# Patient Record
Sex: Female | Born: 1975 | Race: Black or African American | Hispanic: No | Marital: Married | Smoking: Former smoker
Health system: Southern US, Community
[De-identification: ages and names within clinical notes are randomized; demographics above are authoritative.]

## PROBLEM LIST (undated history)

## (undated) DIAGNOSIS — D649 Anemia, unspecified: Secondary | ICD-10-CM

## (undated) DIAGNOSIS — N189 Chronic kidney disease, unspecified: Secondary | ICD-10-CM

## (undated) DIAGNOSIS — F419 Anxiety disorder, unspecified: Secondary | ICD-10-CM

## (undated) DIAGNOSIS — F329 Major depressive disorder, single episode, unspecified: Secondary | ICD-10-CM

## (undated) DIAGNOSIS — R590 Localized enlarged lymph nodes: Secondary | ICD-10-CM

## (undated) DIAGNOSIS — R350 Frequency of micturition: Secondary | ICD-10-CM

## (undated) DIAGNOSIS — Z5189 Encounter for other specified aftercare: Secondary | ICD-10-CM

## (undated) DIAGNOSIS — R0600 Dyspnea, unspecified: Secondary | ICD-10-CM

## (undated) DIAGNOSIS — E559 Vitamin D deficiency, unspecified: Secondary | ICD-10-CM

## (undated) DIAGNOSIS — I10 Essential (primary) hypertension: Secondary | ICD-10-CM

## (undated) DIAGNOSIS — T7840XA Allergy, unspecified, initial encounter: Secondary | ICD-10-CM

## (undated) DIAGNOSIS — M5126 Other intervertebral disc displacement, lumbar region: Secondary | ICD-10-CM

## (undated) DIAGNOSIS — R569 Unspecified convulsions: Secondary | ICD-10-CM

## (undated) DIAGNOSIS — J45991 Cough variant asthma: Secondary | ICD-10-CM

## (undated) DIAGNOSIS — G43909 Migraine, unspecified, not intractable, without status migrainosus: Secondary | ICD-10-CM

## (undated) DIAGNOSIS — G4730 Sleep apnea, unspecified: Secondary | ICD-10-CM

## (undated) DIAGNOSIS — M7989 Other specified soft tissue disorders: Secondary | ICD-10-CM

## (undated) DIAGNOSIS — R609 Edema, unspecified: Secondary | ICD-10-CM

## (undated) DIAGNOSIS — M479 Spondylosis, unspecified: Secondary | ICD-10-CM

## (undated) DIAGNOSIS — R400 Somnolence: Secondary | ICD-10-CM

## (undated) DIAGNOSIS — M79606 Pain in leg, unspecified: Secondary | ICD-10-CM

## (undated) DIAGNOSIS — D869 Sarcoidosis, unspecified: Secondary | ICD-10-CM

## (undated) DIAGNOSIS — M1990 Unspecified osteoarthritis, unspecified site: Secondary | ICD-10-CM

## (undated) DIAGNOSIS — J45909 Unspecified asthma, uncomplicated: Secondary | ICD-10-CM

## (undated) DIAGNOSIS — K449 Diaphragmatic hernia without obstruction or gangrene: Secondary | ICD-10-CM

## (undated) DIAGNOSIS — D219 Benign neoplasm of connective and other soft tissue, unspecified: Secondary | ICD-10-CM

## (undated) DIAGNOSIS — F321 Major depressive disorder, single episode, moderate: Secondary | ICD-10-CM

## (undated) DIAGNOSIS — E611 Iron deficiency: Secondary | ICD-10-CM

## (undated) DIAGNOSIS — F909 Attention-deficit hyperactivity disorder, unspecified type: Secondary | ICD-10-CM

## (undated) DIAGNOSIS — G8929 Other chronic pain: Secondary | ICD-10-CM

## (undated) DIAGNOSIS — G43019 Migraine without aura, intractable, without status migrainosus: Secondary | ICD-10-CM

## (undated) DIAGNOSIS — R7301 Impaired fasting glucose: Secondary | ICD-10-CM

## (undated) DIAGNOSIS — E119 Type 2 diabetes mellitus without complications: Secondary | ICD-10-CM

## (undated) DIAGNOSIS — R0989 Other specified symptoms and signs involving the circulatory and respiratory systems: Secondary | ICD-10-CM

## (undated) DIAGNOSIS — K219 Gastro-esophageal reflux disease without esophagitis: Secondary | ICD-10-CM

## (undated) DIAGNOSIS — Z862 Personal history of diseases of the blood and blood-forming organs and certain disorders involving the immune mechanism: Secondary | ICD-10-CM

## (undated) DIAGNOSIS — R6882 Decreased libido: Secondary | ICD-10-CM

## (undated) DIAGNOSIS — E876 Hypokalemia: Secondary | ICD-10-CM

## (undated) DIAGNOSIS — F32A Depression, unspecified: Secondary | ICD-10-CM

## (undated) DIAGNOSIS — R0789 Other chest pain: Secondary | ICD-10-CM

## (undated) DIAGNOSIS — M545 Low back pain: Secondary | ICD-10-CM

## (undated) DIAGNOSIS — M19079 Primary osteoarthritis, unspecified ankle and foot: Secondary | ICD-10-CM

## (undated) HISTORY — DX: Anemia, unspecified: D64.9

## (undated) HISTORY — DX: Vitamin D deficiency, unspecified: E55.9

## (undated) HISTORY — DX: Migraine, unspecified, not intractable, without status migrainosus: G43.909

## (undated) HISTORY — DX: Encounter for other specified aftercare: Z51.89

## (undated) HISTORY — DX: Anxiety disorder, unspecified: F41.9

## (undated) HISTORY — DX: Essential (primary) hypertension: I10

## (undated) HISTORY — DX: Migraine without aura, intractable, without status migrainosus: G43.019

## (undated) HISTORY — DX: Edema, unspecified: R60.9

## (undated) HISTORY — DX: Other intervertebral disc displacement, lumbar region: M51.26

## (undated) HISTORY — DX: Diaphragmatic hernia without obstruction or gangrene: K44.9

## (undated) HISTORY — DX: Unspecified osteoarthritis, unspecified site: M19.90

## (undated) HISTORY — PX: TUBAL LIGATION: SHX77

## (undated) HISTORY — DX: Sarcoidosis, unspecified: D86.9

## (undated) HISTORY — DX: Other specified symptoms and signs involving the circulatory and respiratory systems: R09.89

## (undated) HISTORY — DX: Iron deficiency: E61.1

## (undated) HISTORY — PX: ABDOMINAL HYSTERECTOMY: SHX81

## (undated) HISTORY — DX: Localized enlarged lymph nodes: R59.0

## (undated) HISTORY — DX: Type 2 diabetes mellitus without complications: E11.9

## (undated) HISTORY — DX: Attention-deficit hyperactivity disorder, unspecified type: F90.9

## (undated) HISTORY — DX: Major depressive disorder, single episode, unspecified: F32.9

## (undated) HISTORY — DX: Unspecified asthma, uncomplicated: J45.909

## (undated) HISTORY — DX: Sleep apnea, unspecified: G47.30

## (undated) HISTORY — DX: Allergy, unspecified, initial encounter: T78.40XA

## (undated) HISTORY — DX: Chronic kidney disease, unspecified: N18.9

## (undated) HISTORY — DX: Spondylosis, unspecified: M47.9

## (undated) HISTORY — DX: Depression, unspecified: F32.A

---
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
## 2005-05-13 ENCOUNTER — Emergency Department (HOSPITAL_COMMUNITY): Admission: EM | Admit: 2005-05-13 | Discharge: 2005-05-13 | Payer: Self-pay | Admitting: Emergency Medicine

## 2005-06-14 ENCOUNTER — Emergency Department (HOSPITAL_COMMUNITY): Admission: EM | Admit: 2005-06-14 | Discharge: 2005-06-14 | Payer: Self-pay | Admitting: Emergency Medicine

## 2009-03-04 ENCOUNTER — Emergency Department (HOSPITAL_COMMUNITY): Admission: EM | Admit: 2009-03-04 | Discharge: 2009-03-04 | Payer: Self-pay | Admitting: Emergency Medicine

## 2009-04-18 ENCOUNTER — Emergency Department (HOSPITAL_COMMUNITY): Admission: EM | Admit: 2009-04-18 | Discharge: 2009-04-18 | Payer: Self-pay | Admitting: Emergency Medicine

## 2011-04-08 LAB — URINALYSIS, ROUTINE W REFLEX MICROSCOPIC
Bilirubin Urine: NEGATIVE
Glucose, UA: NEGATIVE mg/dL
Hgb urine dipstick: NEGATIVE
Ketones, ur: NEGATIVE mg/dL
Nitrite: POSITIVE — AB
Protein, ur: NEGATIVE mg/dL
Specific Gravity, Urine: 1.021 (ref 1.005–1.030)
Urobilinogen, UA: 0.2 mg/dL (ref 0.0–1.0)
pH: 6 (ref 5.0–8.0)

## 2011-04-08 LAB — URINE MICROSCOPIC-ADD ON

## 2011-04-08 LAB — POCT PREGNANCY, URINE: Preg Test, Ur: NEGATIVE

## 2011-11-23 ENCOUNTER — Emergency Department (HOSPITAL_COMMUNITY): Admission: EM | Admit: 2011-11-23 | Discharge: 2011-11-23 | Discharge status: Against Medical Advice | Payer: Self-pay

## 2012-07-10 ENCOUNTER — Emergency Department (HOSPITAL_COMMUNITY): Payer: Self-pay

## 2012-07-10 ENCOUNTER — Emergency Department (HOSPITAL_COMMUNITY)
Admission: EM | Admit: 2012-07-10 | Discharge: 2012-07-10 | Disposition: A | Discharge status: Home / Self Care | Payer: Self-pay | Attending: Emergency Medicine | Admitting: Emergency Medicine

## 2012-07-10 ENCOUNTER — Encounter (HOSPITAL_COMMUNITY): Payer: Self-pay | Admitting: Family Medicine

## 2012-07-10 DIAGNOSIS — W010XXA Fall on same level from slipping, tripping and stumbling without subsequent striking against object, initial encounter: Secondary | ICD-10-CM | POA: Insufficient documentation

## 2012-07-10 DIAGNOSIS — M549 Dorsalgia, unspecified: Secondary | ICD-10-CM | POA: Insufficient documentation

## 2012-07-10 DIAGNOSIS — T1490XA Injury, unspecified, initial encounter: Secondary | ICD-10-CM | POA: Insufficient documentation

## 2012-07-10 DIAGNOSIS — N390 Urinary tract infection, site not specified: Secondary | ICD-10-CM | POA: Insufficient documentation

## 2012-07-10 DIAGNOSIS — W19XXXA Unspecified fall, initial encounter: Secondary | ICD-10-CM

## 2012-07-10 HISTORY — DX: Unspecified convulsions: R56.9

## 2012-07-10 LAB — URINE MICROSCOPIC-ADD ON

## 2012-07-10 LAB — URINALYSIS, ROUTINE W REFLEX MICROSCOPIC
Bilirubin Urine: NEGATIVE
Glucose, UA: NEGATIVE mg/dL
Hgb urine dipstick: NEGATIVE
Nitrite: POSITIVE — AB
Protein, ur: NEGATIVE mg/dL
Specific Gravity, Urine: 1.028 (ref 1.005–1.030)
Urobilinogen, UA: 0.2 mg/dL (ref 0.0–1.0)
pH: 5.5 (ref 5.0–8.0)

## 2012-07-10 MED ORDER — OXYCODONE-ACETAMINOPHEN 5-325 MG PO TABS
2.0000 | ORAL_TABLET | Freq: Once | ORAL | Status: AC
  Administered 2012-07-10: 2 via ORAL
  Filled 2012-07-10: qty 2

## 2012-07-10 MED ORDER — NITROFURANTOIN MONOHYD MACRO 100 MG PO CAPS: 100.0000 mg | ORAL_CAPSULE | Freq: Two times a day (BID) | ORAL | Status: AC

## 2012-07-10 MED ORDER — HYDROCODONE-ACETAMINOPHEN 5-325 MG PO TABS: 2.0000 | ORAL_TABLET | ORAL | Status: AC | PRN

## 2012-07-10 NOTE — ED Provider Notes (Signed)
History     CSN: [id number]  Arrival date & time 07/10/12  1436   First MD Initiated Contact with Patient 07/10/12 1547      Chief Complaint  Patient presents with  . Fall    (Consider location/radiation/quality/duration/timing/severity/associated sxs/prior treatment) HPI Comments: Patient reports that approximately 5 hours prior to arrival she slipped and fell while getting out of the shower.  She landed on the right side of her body.  She did not hit her head.  No LOC.  She was ambulatory after the fall.  She is currently having pain of her right arm, right leg, upper back, and the right side of her neck.  She tried taking Ibuprofen, but does not feel that it helped with the pain.  She denies any nausea, vomiting, vision changes, dizziness, or lightheadedness.     Patient is a 36 y.o. female presenting with dysuria. The history is provided by the patient.  Dysuria  This is a new problem. Episode onset: one week ago. The problem occurs every urination. The problem has been gradually worsening. The quality of the pain is described as burning. There has been no fever. Associated symptoms include frequency. Pertinent negatives include no chills, no nausea, no vomiting, no discharge, no hematuria, no urgency and no flank pain. She has tried nothing for the symptoms. Her past medical history does not include kidney stones or recurrent UTIs.    Past Medical History  Diagnosis Date  . Seizures     Past Surgical History  Procedure Date  . Tubal ligation     History reviewed. No pertinent family history.  History  Substance Use Topics  . Smoking status: Current Everyday Smoker -- 0.5 packs/day  . Smokeless tobacco: Not on file  . Alcohol Use: No    OB History    Grav Para Term Preterm Abortions TAB SAB Ect Mult Living                  Review of Systems  Constitutional: Negative for fever and chills.  HENT: Negative for neck pain and neck stiffness.   Eyes: Negative for  visual disturbance.  Respiratory: Negative for shortness of breath.   Cardiovascular: Negative for chest pain.  Gastrointestinal: Negative for nausea, vomiting and abdominal pain.  Genitourinary: Positive for dysuria and frequency. Negative for urgency, hematuria and flank pain.  Musculoskeletal: Positive for back pain. Negative for gait problem.  Skin: Negative for wound.  Neurological: Negative for dizziness, syncope, light-headedness and numbness.    Allergies  Review of patient's allergies indicates no known allergies.  Home Medications   Current Outpatient Rx  Name Route Sig Dispense Refill  . IBUPROFEN 200 MG PO TABS Oral Take 600 mg by mouth every 6 (six) hours as needed. Pain.      BP 117/86  Pulse 56  Temp 99.1 F (37.3 C) (Oral)  Resp 18  SpO2 100%  LMP 07/03/2012  Physical Exam  Nursing note and vitals reviewed. Constitutional: She appears well-developed and well-nourished. No distress.  HENT:  Head: Normocephalic and atraumatic.  Mouth/Throat: Oropharynx is clear and moist.  Eyes: EOM are normal. Pupils are equal, round, and reactive to light.  Neck: Normal range of motion. Neck supple.  Cardiovascular: Normal rate, regular rhythm and normal heart sounds.   Pulmonary/Chest: Effort normal and breath sounds normal.  Musculoskeletal: Normal range of motion. She exhibits no edema and no tenderness.  Neurological: She is alert. She has normal strength. No cranial nerve deficit or sensory  deficit. Gait normal.  Skin: Skin is warm, dry and intact. No abrasion, no bruising and no ecchymosis noted. She is not diaphoretic. No erythema.  Psychiatric: She has a normal mood and affect.    ED Course  Procedures (including critical care time)  Labs Reviewed - No data to display Dg Thoracic Spine 2 View  07/10/2012  *RADIOLOGY REPORT*  Clinical Data: Fall, central back pain  THORACIC SPINE - 2 VIEW  Comparison: None.  Findings: Normal alignment.  No compression fracture,  wedge shaped deformity or focal kyphosis.  Very minor anterior endplate degenerative changes.  Preserved vertebral body heights and disc spaces.  Normal paraspinous soft tissues.  Pedicles appear intact.  IMPRESSION: No acute finding by plain radiography  Original Report Authenticated By: Judie Petit. Ruel Favors, M.D.     1. Back pain   2. Fall   3. UTI (urinary tract infection)       MDM  Patient presenting after a fall in the shower.  Full ROM of all extremities.  No LOC.  Thoracic spine tender to palpation.  Therefore, xrays ordered.  Negative xrays.  Patient also presenting with dysuria.  UA positive for UTI.  Patient febrile.  No nausea or vomiting.  No flank pain.  Patient discharged home with antibiotic.          Pascal Lux Rossmoyne, PA-C 07/11/12 [phone]

## 2012-07-10 NOTE — ED Notes (Signed)
Pt reports slipping getting out of the shower this morning. Denies loc. Reports soreness all over. NAD noted at this time. Denies dizziness, vision problems, N/V.

## 2012-07-11 NOTE — ED Provider Notes (Signed)
Medical screening examination/treatment/procedure(s) were performed by non-physician practitioner and as supervising physician I was immediately available for consultation/collaboration.  Flint Melter, MD 07/11/12 [phone]

## 2012-08-07 ENCOUNTER — Emergency Department (HOSPITAL_COMMUNITY): Payer: Self-pay

## 2012-08-07 ENCOUNTER — Encounter (HOSPITAL_COMMUNITY): Payer: Self-pay | Admitting: Emergency Medicine

## 2012-08-07 ENCOUNTER — Emergency Department (HOSPITAL_COMMUNITY)
Admission: EM | Admit: 2012-08-07 | Discharge: 2012-08-07 | Disposition: A | Discharge status: Home / Self Care | Payer: Self-pay | Attending: Emergency Medicine | Admitting: Emergency Medicine

## 2012-08-07 DIAGNOSIS — S8000XA Contusion of unspecified knee, initial encounter: Secondary | ICD-10-CM

## 2012-08-07 DIAGNOSIS — M25569 Pain in unspecified knee: Secondary | ICD-10-CM | POA: Insufficient documentation

## 2012-08-07 DIAGNOSIS — F17200 Nicotine dependence, unspecified, uncomplicated: Secondary | ICD-10-CM | POA: Insufficient documentation

## 2012-08-07 DIAGNOSIS — G40909 Epilepsy, unspecified, not intractable, without status epilepticus: Secondary | ICD-10-CM | POA: Insufficient documentation

## 2012-08-07 MED ORDER — IBUPROFEN 600 MG PO TABS: 600.0000 mg | ORAL_TABLET | Freq: Three times a day (TID) | ORAL | Status: AC | PRN

## 2012-08-07 MED ORDER — HYDROCODONE-ACETAMINOPHEN 5-325 MG PO TABS
2.0000 | ORAL_TABLET | Freq: Once | ORAL | Status: AC
  Administered 2012-08-07: 2 via ORAL
  Filled 2012-08-07: qty 2

## 2012-08-07 NOTE — ED Provider Notes (Signed)
History  This chart was scribed for Suzi Roots, MD by Bennett Scrape. This patient was seen in room TR08C/TR08C and the patient's care was started at 1:24PM.  CSN: [id number]  Arrival date & time 08/07/12  1202   First MD Initiated Contact with Patient 08/07/12 1324      Chief Complaint  Patient presents with  . Knee Pain    Patient is a 36 y.o. female presenting with knee pain. The history is provided by the patient. No language interpreter was used.  Knee Pain    Victoria Moore is a 36 y.o. female who presents to the Emergency Department complaining of 1 to 2 hours of sudden onset, non-changing, constant left knee pain described as a shooting pain that radiates up to the left hip that started after she smacked it against her dresser at home. The pain is worse with standing and walking. She denies taking OTC medications at home to improve symptoms. She denies prior problems with the left knee. She denies any other injuries currently. She denies fever, chills, nausea and emesis as associated symptoms. She has a h/o seizures. She is a current everyday smoker and occasional alcohol user.denies twisting injury. Is ambulatory. No hip or ankle pain.   Past Medical History  Diagnosis Date  . Seizures     Past Surgical History  Procedure Date  . Tubal ligation     History reviewed. No pertinent family history.  History  Substance Use Topics  . Smoking status: Current Everyday Smoker -- 0.5 packs/day  . Smokeless tobacco: Not on file  . Alcohol Use: Yes     occasional    No OB history provided.  Review of Systems  Constitutional: Negative for fever and chills.  Cardiovascular: Negative for leg swelling.  Musculoskeletal: Negative for joint swelling.  Skin: Negative for wound.  Neurological: Negative for weakness and numbness.    Allergies  Review of patient's allergies indicates no known allergies.  Home Medications   Current Outpatient Rx  Name Route Sig  Dispense Refill  . IBUPROFEN 200 MG PO TABS Oral Take 600 mg by mouth every 6 (six) hours as needed. For pain.    Marland Kitchen PHENYTOIN SODIUM EXTENDED 100 MG PO CAPS Oral Take 300 mg by mouth daily as needed. For seizures.      Triage Vitals: BP 135/90  Pulse 61  Temp 98.2 F (36.8 C) (Oral)  Resp 16  SpO2 99%  LMP 07/31/2012  Physical Exam  Nursing note and vitals reviewed. Constitutional: She appears well-developed and well-nourished. No distress.  Eyes: Conjunctivae are normal. No scleral icterus.  Neck: Neck supple. No tracheal deviation present.  Cardiovascular: Normal rate.   Pulmonary/Chest: Effort normal. No respiratory distress.  Abdominal: Normal appearance. She exhibits no distension.  Musculoskeletal: She exhibits no edema.       Tenderness left knee anteriorly. No effusion. Knee stable, no gross ligament laxity. Good rom at hip knee and ankle without pain.  Distal pulses palp.   Neurological: She is alert.  Skin: Skin is warm and dry. No rash noted.  Psychiatric: She has a normal mood and affect.    ED Course  Procedures (including critical care time)  DIAGNOSTIC STUDIES: Oxygen Saturation is 99% on room air, normal by my interpretation.    COORDINATION OF CARE: 1:   Labs Reviewed - No data to display Dg Knee Complete 4 Views Left  08/07/2012  *RADIOLOGY REPORT*  Clinical Data: Recent trauma with pain  LEFT KNEE - COMPLETE  4+ VIEW  Comparison: None.  Findings: No evidence of effusion, fracture, dislocation, degenerative change or other focal lesion.  IMPRESSION: Negative radiographs  Original Report Authenticated By: Thomasenia Sales, M.D.       MDM  I personally performed the services described in this documentation, which was scribed in my presence. The recorded information has been reviewed and considered. Suzi Roots, MD   vicodin po in ed (pt states has ride, does not have to drive).  xrays neg.   Motrin po.       Suzi Roots, MD 08/07/12  [phone]

## 2012-08-07 NOTE — ED Notes (Signed)
Pt c/o left knee pain after hitting knee on dresser today; pt sts painful to walk on

## 2012-10-27 ENCOUNTER — Emergency Department (HOSPITAL_COMMUNITY)
Admission: EM | Admit: 2012-10-27 | Discharge: 2012-10-27 | Disposition: A | Discharge status: Home / Self Care | Payer: Self-pay | Attending: Emergency Medicine | Admitting: Emergency Medicine

## 2012-10-27 ENCOUNTER — Emergency Department (HOSPITAL_COMMUNITY): Payer: Self-pay

## 2012-10-27 ENCOUNTER — Encounter (HOSPITAL_COMMUNITY): Payer: Self-pay | Admitting: Family Medicine

## 2012-10-27 DIAGNOSIS — Y9289 Other specified places as the place of occurrence of the external cause: Secondary | ICD-10-CM | POA: Insufficient documentation

## 2012-10-27 DIAGNOSIS — S0993XA Unspecified injury of face, initial encounter: Secondary | ICD-10-CM | POA: Insufficient documentation

## 2012-10-27 DIAGNOSIS — S199XXA Unspecified injury of neck, initial encounter: Secondary | ICD-10-CM

## 2012-10-27 DIAGNOSIS — F17200 Nicotine dependence, unspecified, uncomplicated: Secondary | ICD-10-CM | POA: Insufficient documentation

## 2012-10-27 DIAGNOSIS — W1789XA Other fall from one level to another, initial encounter: Secondary | ICD-10-CM | POA: Insufficient documentation

## 2012-10-27 DIAGNOSIS — G40802 Other epilepsy, not intractable, without status epilepticus: Secondary | ICD-10-CM | POA: Insufficient documentation

## 2012-10-27 DIAGNOSIS — Y93I9 Activity, other involving external motion: Secondary | ICD-10-CM | POA: Insufficient documentation

## 2012-10-27 MED ORDER — ALBUTEROL SULFATE (5 MG/ML) 0.5% IN NEBU: 5.0000 mg | INHALATION_SOLUTION | Freq: Once | RESPIRATORY_TRACT | Status: DC

## 2012-10-27 MED ORDER — PREDNISONE 20 MG PO TABS: 60.0000 mg | ORAL_TABLET | Freq: Once | ORAL | Status: DC

## 2012-10-27 MED ORDER — IPRATROPIUM BROMIDE 0.02 % IN SOLN: 0.5000 mg | Freq: Once | RESPIRATORY_TRACT | Status: DC

## 2012-10-27 MED ORDER — OXYCODONE-ACETAMINOPHEN 5-325 MG PO TABS: 1.0000 | ORAL_TABLET | ORAL | Status: DC | PRN

## 2012-10-27 MED ORDER — IBUPROFEN 400 MG PO TABS
400.0000 mg | ORAL_TABLET | Freq: Once | ORAL | Status: AC
  Administered 2012-10-27: 400 mg via ORAL
  Filled 2012-10-27: qty 1
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
## 2012-11-15 ENCOUNTER — Emergency Department (HOSPITAL_COMMUNITY): Payer: Self-pay

## 2012-11-15 ENCOUNTER — Emergency Department (HOSPITAL_COMMUNITY)
Admission: EM | Admit: 2012-11-15 | Discharge: 2012-11-15 | Disposition: A | Discharge status: Home / Self Care | Payer: Self-pay | Attending: Emergency Medicine | Admitting: Emergency Medicine

## 2012-11-15 ENCOUNTER — Encounter (HOSPITAL_COMMUNITY): Payer: Self-pay | Admitting: Emergency Medicine

## 2012-11-15 DIAGNOSIS — S93409A Sprain of unspecified ligament of unspecified ankle, initial encounter: Secondary | ICD-10-CM | POA: Insufficient documentation

## 2012-11-15 DIAGNOSIS — W108XXA Fall (on) (from) other stairs and steps, initial encounter: Secondary | ICD-10-CM | POA: Insufficient documentation

## 2012-11-15 DIAGNOSIS — Y929 Unspecified place or not applicable: Secondary | ICD-10-CM | POA: Insufficient documentation

## 2012-11-15 DIAGNOSIS — Z8669 Personal history of other diseases of the nervous system and sense organs: Secondary | ICD-10-CM | POA: Insufficient documentation

## 2012-11-15 DIAGNOSIS — Y9301 Activity, walking, marching and hiking: Secondary | ICD-10-CM | POA: Insufficient documentation

## 2012-11-15 DIAGNOSIS — F17200 Nicotine dependence, unspecified, uncomplicated: Secondary | ICD-10-CM | POA: Insufficient documentation

## 2012-11-15 MED ORDER — OXYCODONE-ACETAMINOPHEN 5-325 MG PO TABS: 1.0000 | ORAL_TABLET | ORAL | Status: DC | PRN

## 2012-11-15 MED ORDER — MORPHINE SULFATE 4 MG/ML IJ SOLN
4.0000 mg | Freq: Once | INTRAMUSCULAR | Status: AC
  Administered 2012-11-15: 4 mg via INTRAMUSCULAR
  Filled 2012-11-15: qty 1

## 2012-11-15 NOTE — ED Notes (Signed)
Patient transported to X-ray 

## 2012-11-15 NOTE — ED Provider Notes (Signed)
History    This chart was scribed for Cheri Guppy, MD, MD by Smitty Pluck, ED Scribe. The patient was seen in room TR10C and the patient's care was started at 12:42PM.   CSN: [id number]  Arrival date & time 11/15/12  1138      Chief Complaint  Patient presents with  . Ankle Pain    (Consider location/radiation/quality/duration/timing/severity/associated sxs/prior treatment) Patient is a 36 y.o. female presenting with ankle pain. The history is provided by the patient. No language interpreter was used.  Ankle Pain    Victoria Moore is a 36 y.o. female who presents to the Emergency Department complaining of constant, moderate left ankle pain onset today. Pt reports that she was walking down steps and fell down steps landing on her left ankle. Pt denies LOC, head injury, back pain, leg pain and any other pain. Pt has not taken any medication PTA.  Pt is allergic to Ibuprofen   Past Medical History  Diagnosis Date  . Seizures     Past Surgical History  Procedure Date  . Tubal ligation     History reviewed. No pertinent family history.  History  Substance Use Topics  . Smoking status: Current Every Day Smoker -- 0.5 packs/day  . Smokeless tobacco: Not on file  . Alcohol Use: Yes     Comment: occasional    OB History    Grav Para Term Preterm Abortions TAB SAB Ect Mult Living                  Review of Systems  Constitutional: Negative for fever and chills.  Respiratory: Negative for shortness of breath.   Gastrointestinal: Negative for nausea and vomiting.  Neurological: Negative for weakness.  All other systems reviewed and are negative.    Allergies  Aspirin  Home Medications   Current Outpatient Rx  Name  Route  Sig  Dispense  Refill  . OXYCODONE-ACETAMINOPHEN 5-325 MG PO TABS   Oral   Take 1 tablet by mouth every 4 (four) hours as needed for pain.   15 tablet   0     Dispense as written.     BP 137/109  Pulse 71  Temp 98.3 F (36.8  C) (Oral)  Resp 18  SpO2 100%  LMP 10/20/2012  Physical Exam  Nursing note and vitals reviewed. Constitutional: She is oriented to person, place, and time. She appears well-developed and well-nourished. No distress.  HENT:  Head: Normocephalic and atraumatic.  Eyes: EOM are normal.  Neck: Neck supple. No tracheal deviation present.  Cardiovascular: Normal rate.   Pulmonary/Chest: Effort normal. No respiratory distress.  Musculoskeletal: Normal range of motion.       Anterior lower tibia is tender to palpation  No deformity  No tenderness of medial malleolus  Tenderness of achilles tendon Lateral malleolus and distal fibular tenderness  No tenderness of base of 5th metatarsal   Neurological: She is alert and oriented to person, place, and time.  Skin: Skin is warm and dry.  Psychiatric: She has a normal mood and affect. Her behavior is normal.    ED Course  Procedures (including critical care time) DIAGNOSTIC STUDIES: Oxygen Saturation is 100% on room air, normal by my interpretation.    COORDINATION OF CARE: 12:47 PM Discussed ED treatment with pt  12:47 PM Ordered:    .  morphine injection  4 mg Intramuscular Once       Labs Reviewed - No data to display Dg Ankle Complete Left  11/15/2012  *RADIOLOGY REPORT*  Clinical Data: Larey Seat and twisted left ankle.  Ankle pain and swelling.  LEFT ANKLE COMPLETE - 3+ VIEW  Comparison: None.  Findings: There is no evidence for fracture, subluxation or dislocation.  No worrisome lytic or sclerotic osseous lesion.  IMPRESSION: Normal exam.   Original Report Authenticated By: Kennith Center, M.D.      No diagnosis found.    MDM  Ankle sprain No fx or dislocation      I personally performed the services described in this documentation, which was scribed in my presence. The recorded information has been reviewed and is accurate.     Cheri Guppy, MD 11/15/12 1355

## 2012-11-15 NOTE — ED Notes (Signed)
Pt c/o left ankle pain after twisting ankle when going down stairs

## 2012-11-15 NOTE — ED Notes (Signed)
Splint applied and crutches given to patient.

## 2013-03-14 ENCOUNTER — Emergency Department (HOSPITAL_COMMUNITY): Payer: Self-pay

## 2013-03-14 ENCOUNTER — Emergency Department (HOSPITAL_COMMUNITY)
Admission: EM | Admit: 2013-03-14 | Discharge: 2013-03-14 | Disposition: A | Discharge status: Home / Self Care | Payer: Self-pay | Attending: Emergency Medicine | Admitting: Emergency Medicine

## 2013-03-14 ENCOUNTER — Encounter (HOSPITAL_COMMUNITY): Payer: Self-pay | Admitting: *Deleted

## 2013-03-14 DIAGNOSIS — R3911 Hesitancy of micturition: Secondary | ICD-10-CM | POA: Insufficient documentation

## 2013-03-14 DIAGNOSIS — G5600 Carpal tunnel syndrome, unspecified upper limb: Secondary | ICD-10-CM | POA: Insufficient documentation

## 2013-03-14 DIAGNOSIS — Z3202 Encounter for pregnancy test, result negative: Secondary | ICD-10-CM | POA: Insufficient documentation

## 2013-03-14 DIAGNOSIS — G40909 Epilepsy, unspecified, not intractable, without status epilepticus: Secondary | ICD-10-CM | POA: Insufficient documentation

## 2013-03-14 DIAGNOSIS — Z79899 Other long term (current) drug therapy: Secondary | ICD-10-CM | POA: Insufficient documentation

## 2013-03-14 DIAGNOSIS — G5601 Carpal tunnel syndrome, right upper limb: Secondary | ICD-10-CM

## 2013-03-14 DIAGNOSIS — M79609 Pain in unspecified limb: Secondary | ICD-10-CM | POA: Insufficient documentation

## 2013-03-14 DIAGNOSIS — F17200 Nicotine dependence, unspecified, uncomplicated: Secondary | ICD-10-CM | POA: Insufficient documentation

## 2013-03-14 LAB — PREGNANCY, URINE: Preg Test, Ur: NEGATIVE

## 2013-03-14 LAB — URINALYSIS, ROUTINE W REFLEX MICROSCOPIC
Glucose, UA: NEGATIVE mg/dL
Hgb urine dipstick: NEGATIVE
Ketones, ur: NEGATIVE mg/dL
Nitrite: NEGATIVE
Protein, ur: NEGATIVE mg/dL
Specific Gravity, Urine: 1.025 (ref 1.005–1.030)
Urobilinogen, UA: 0.2 mg/dL (ref 0.0–1.0)
pH: 5 (ref 5.0–8.0)

## 2013-03-14 LAB — URINE MICROSCOPIC-ADD ON

## 2013-03-14 MED ORDER — NAPROXEN 375 MG PO TABS
375.0000 mg | ORAL_TABLET | Freq: Once | ORAL | Status: AC
  Administered 2013-03-14: 375 mg via ORAL
  Filled 2013-03-14: qty 1

## 2013-03-14 MED ORDER — NAPROXEN 375 MG PO TABS: 375.0000 mg | ORAL_TABLET | Freq: Two times a day (BID) | ORAL | Status: DC

## 2013-03-14 MED ORDER — PHENAZOPYRIDINE HCL 200 MG PO TABS: 200.0000 mg | ORAL_TABLET | Freq: Three times a day (TID) | ORAL | Status: DC

## 2013-03-14 NOTE — ED Provider Notes (Signed)
Medical screening examination/treatment/procedure(s) were performed by non-physician practitioner and as supervising physician I was immediately available for consultation/collaboration.    Kleber Crean R Sheril Hammond, MD 03/14/13 2349 

## 2013-03-14 NOTE — ED Provider Notes (Signed)
History     CSN: [id number]  Arrival date & time 03/14/13  1522   First MD Initiated Contact with Patient 03/14/13 1544      Chief Complaint  Patient presents with  . Urinary Tract Infection  . Arm Pain    (Consider location/radiation/quality/duration/timing/severity/associated sxs/prior treatment) HPI  PT to the ER with complaints of dysuria and urinary hesitancy. She is also having right wrist pain. Pt has been seen in the past year for multiple pain complaints.  Urinary symptoms- The symptoms have been persisting for about 3 weeks. She also admits that she was having similar symptoms previously which were caused by a UTI. She denies having flank pain, fevers, chills, N/V/D,.  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 The pain in her wrist is worse with extension and flexion. She describes the pain as shooting out into her fingers and up her shoulder. Denies any specific injury or start date.    Past Medical History  Diagnosis Date  . Seizures     Past Surgical History  Procedure Laterality Date  . Tubal ligation      No family history on file.  History  Substance Use Topics  . Smoking status: Current Every Day Smoker -- 0.50 packs/day  . Smokeless tobacco: Not on file  . Alcohol Use: Yes     Comment: occasional    OB History   Grav Para Term Preterm Abortions TAB SAB Ect Mult Living                  Review of Systems  All other systems reviewed and are negative.   Allergies  Aspirin  Home Medications   Current Outpatient Rx  Name  Route  Sig  Dispense  Refill  . ibuprofen (ADVIL,MOTRIN) 200 MG tablet   Oral   Take 800 mg by mouth every 6 (six) hours as needed for pain.         . naproxen (NAPROSYN) 375 MG tablet   Oral   Take 1 tablet (375 mg total) by mouth 2 (two) times daily.   20 tablet   0   . phenazopyridine (PYRIDIUM) 200 MG tablet   Oral   Take 1 tablet (200 mg total) by mouth 3 (three) times daily.   6 tablet   0      BP 142/94  Pulse 76  Temp(Src) 99.5 F (37.5 C) (Oral)  Resp 20  SpO2 100%  LMP 02/22/2013  Physical Exam  Nursing note and vitals reviewed. Constitutional: She appears well-developed and well-nourished. No distress.  HENT:  Head: Normocephalic and atraumatic.  Eyes: Pupils are equal, round, and reactive to light.  Neck: Normal range of motion. Neck supple.  Cardiovascular: Normal rate and regular rhythm.   Pulmonary/Chest: Effort normal.  Abdominal: Soft. There is no tenderness.  Musculoskeletal:       Right wrist: She exhibits decreased range of motion (due to pain) and tenderness. She exhibits no bony tenderness, no swelling, no effusion, no crepitus, no deformity and no laceration.  Neurological: She is alert.  Skin: Skin is warm and dry.    ED Course  Procedures (including critical care time)  Labs Reviewed  URINALYSIS, ROUTINE W REFLEX MICROSCOPIC - Abnormal; Notable for the following:    APPearance CLOUDY (*)    Bilirubin Urine SMALL (*)    Leukocytes, UA SMALL (*)    All other components within normal limits  URINE MICROSCOPIC-ADD ON  PREGNANCY, URINE   Dg Wrist Complete  Right  03/14/2013  *RADIOLOGY REPORT*  Clinical Data: Right wrist pain.  RIGHT WRIST - COMPLETE 3+ VIEW  Comparison: None  Findings: No evidence of acute fracture, subluxation or dislocation identified.  No radio-opaque foreign bodies are present.  No focal bony lesions are noted.  The joint spaces are unremarkable.  IMPRESSION: Unremarkable right wrist.   Original Report Authenticated By: Harmon Pier, M.D.      1. 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 Referral to hand and naprosyn Rx given.  Urinalysis not significant for UTI. Culture sent out. Rx Pyridium and referral to Urology.  Pt has been advised of the symptoms that warrant their return to the ED. Patient has voiced understanding and has agreed to follow-up with the PCP  or specialist.         Dorthula Matas, PA-C 03/14/13 1801

## 2013-03-14 NOTE — ED Notes (Signed)
Lab notified of urine preg add-on order

## 2013-03-14 NOTE — ED Notes (Signed)
Pt states for the past 2 days has had R arm tingling/numbness, states it comes and goes, states pain starts at wrist and shoots up R arm, denies injury. Pt also complaining of UTI symptoms, burning/pain w/ urination, states "I only use the bathroom twice a day, morning and night and sometimes I just dribble".

## 2013-03-17 ENCOUNTER — Emergency Department (HOSPITAL_COMMUNITY)
Admission: EM | Admit: 2013-03-17 | Discharge: 2013-03-17 | Disposition: A | Discharge status: Home / Self Care | Payer: Self-pay | Attending: Emergency Medicine | Admitting: Emergency Medicine

## 2013-03-17 ENCOUNTER — Encounter (HOSPITAL_COMMUNITY): Payer: Self-pay | Admitting: Nurse Practitioner

## 2013-03-17 DIAGNOSIS — F17200 Nicotine dependence, unspecified, uncomplicated: Secondary | ICD-10-CM | POA: Insufficient documentation

## 2013-03-17 DIAGNOSIS — Z79899 Other long term (current) drug therapy: Secondary | ICD-10-CM | POA: Insufficient documentation

## 2013-03-17 DIAGNOSIS — M25531 Pain in right wrist: Secondary | ICD-10-CM

## 2013-03-17 DIAGNOSIS — Z8669 Personal history of other diseases of the nervous system and sense organs: Secondary | ICD-10-CM | POA: Insufficient documentation

## 2013-03-17 DIAGNOSIS — M25539 Pain in unspecified wrist: Secondary | ICD-10-CM | POA: Insufficient documentation

## 2013-03-17 DIAGNOSIS — R209 Unspecified disturbances of skin sensation: Secondary | ICD-10-CM | POA: Insufficient documentation

## 2013-03-17 MED ORDER — DEXAMETHASONE SODIUM PHOSPHATE 10 MG/ML IJ SOLN
10.0000 mg | Freq: Once | INTRAMUSCULAR | Status: AC
  Administered 2013-03-17: 10 mg via INTRAMUSCULAR
  Filled 2013-03-17: qty 1

## 2013-03-17 MED ORDER — MELOXICAM 15 MG PO TABS: 15.0000 mg | ORAL_TABLET | Freq: Every day | ORAL | Status: DC

## 2013-03-17 MED ORDER — HYDROCODONE-ACETAMINOPHEN 5-325 MG PO TABS: 1.0000 | ORAL_TABLET | Freq: Four times a day (QID) | ORAL | Status: DC | PRN

## 2013-03-17 NOTE — ED Provider Notes (Signed)
History  This chart was scribed for non-physician practitioner Arthor Captain, PA-C working with Richardean Canal, MD, by Candelaria Stagers, ED Scribe. This patient was seen in room TR08C/TR08C and the patient's care was started at 4:10 PM   CSN: [id number]  Arrival date & time 03/17/13  1546   First MD Initiated Contact with Patient 03/17/13 1603      Chief Complaint  Patient presents with  . Wrist Pain     The history is provided by the patient. No language interpreter was used.   Victoria Moore is a 37 y.o. female who presents to the Emergency Department complaining of right wrist pain that started several weeks ago.  She is also experiencing numbness to the fingers.  She denies injury or trauma.  Pt has radiating pain to the left neck.  Pt was diagnosed with carpel tunnel four days ago and was prescribed naproxen which she states has provided no relief.  Pt is wearing an ankle brace.  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  Pt is right hand dominant.  She has not seen an orthopaedist.      Past Medical History  Diagnosis Date  . Seizures     Past Surgical History  Procedure Laterality Date  . Tubal ligation      No family history on file.  History  Substance Use Topics  . Smoking status: Current Every Day Smoker -- 0.50 packs/day  . Smokeless tobacco: Not on file  . Alcohol Use: No     Comment: occasional    OB History   Grav Para Term Preterm Abortions TAB SAB Ect Mult Living                  Review of Systems  Musculoskeletal: Positive for arthralgias (right wrist pain).  All other systems reviewed and are negative.    Allergies  Aspirin  Home Medications   Current Outpatient Rx  Name  Route  Sig  Dispense  Refill  . naproxen (NAPROSYN) 375 MG tablet   Oral   Take 375 mg by mouth 2 (two) times daily.         . phenazopyridine (PYRIDIUM) 200 MG tablet   Oral   Take 200 mg by mouth 3 (three) times daily.           BP 126/81   Pulse 62  Temp(Src) 98.7 F (37.1 C) (Oral)  Resp 16  SpO2 100%  LMP 02/22/2013  Physical Exam  Nursing note and vitals reviewed. Constitutional: She is oriented to person, place, and time. She appears well-developed and well-nourished. No distress.  HENT:  Head: Normocephalic and atraumatic.  Eyes: EOM are normal.  Neck: Neck supple. No tracheal deviation present.  Cardiovascular: Normal rate.   Pulmonary/Chest: Effort normal. No respiratory distress.  Musculoskeletal: Normal range of motion. She exhibits tenderness.  Tenderness with flexion and extension of right wrist.  No crepitus, popping with movement.  Numbness and tingling throughout the whole right hand.    Neurological: She is alert and oriented to person, place, and time.  Skin: Skin is warm and dry.  Psychiatric: She has a normal mood and affect. Her behavior is normal.    ED Course  Procedures   DIAGNOSTIC STUDIES: Oxygen Saturation is 100% on room air, normal by my interpretation.    COORDINATION OF CARE:  4:12 PM Discussed course of care with pt which includes rest and ice.  Will give Decadron injection in ED  and prescribe Mobic and narcotic pain reliever for night time.  Advised pt to follow up with orthopaedist.  Pt understands and agrees.    Labs Reviewed - No data to display No results found.   1. Wrist pain, right       MDM  Patient with wrist pain.  Possibly from carpal tunnel.  May also be secondary to radiculopathy of the neck.  We'll treat the patient with IM Decadron.  Plan of care as described above. The patient appears reasonably screened and/or stabilized for discharge and I doubt any other medical condition or other [facility] requiring further screening, evaluation, or treatment in the ED at this time prior to discharge.   I personally performed the services described in this documentation, which was scribed in my presence. The recorded information has been reviewed and is  accurate.         Arthor Captain, PA-C 03/18/13 1924

## 2013-03-17 NOTE — ED Notes (Signed)
Pt diagnosed with carpal tunnel this week and given naproxen for pain, states naproxen is not helping the pain

## 2013-03-18 NOTE — ED Provider Notes (Signed)
Medical screening examination/treatment/procedure(s) were performed by non-physician practitioner and as supervising physician I was immediately available for consultation/collaboration.   David H Yao, MD 03/18/13 2357 

## 2013-11-01 ENCOUNTER — Emergency Department (HOSPITAL_COMMUNITY)
Admission: EM | Admit: 2013-11-01 | Discharge: 2013-11-01 | Disposition: A | Discharge status: Home / Self Care | Payer: Self-pay | Attending: Emergency Medicine | Admitting: Emergency Medicine

## 2013-11-01 ENCOUNTER — Encounter (HOSPITAL_COMMUNITY): Payer: Self-pay | Admitting: Emergency Medicine

## 2013-11-01 DIAGNOSIS — R300 Dysuria: Secondary | ICD-10-CM

## 2013-11-01 DIAGNOSIS — N390 Urinary tract infection, site not specified: Secondary | ICD-10-CM | POA: Insufficient documentation

## 2013-11-01 DIAGNOSIS — F17200 Nicotine dependence, unspecified, uncomplicated: Secondary | ICD-10-CM | POA: Insufficient documentation

## 2013-11-01 DIAGNOSIS — Z8669 Personal history of other diseases of the nervous system and sense organs: Secondary | ICD-10-CM | POA: Insufficient documentation

## 2013-11-01 DIAGNOSIS — Z9851 Tubal ligation status: Secondary | ICD-10-CM | POA: Insufficient documentation

## 2013-11-01 LAB — CBC WITH DIFFERENTIAL/PLATELET
Basophils Absolute: 0 10*3/uL (ref 0.0–0.1)
Basophils Relative: 0 % (ref 0–1)
Eosinophils Absolute: 0.3 10*3/uL (ref 0.0–0.7)
Eosinophils Relative: 5 % (ref 0–5)
HCT: 27.7 % — ABNORMAL LOW (ref 36.0–46.0)
Hemoglobin: 8.4 g/dL — ABNORMAL LOW (ref 12.0–15.0)
Lymphocytes Relative: 25 % (ref 12–46)
Lymphs Abs: 1.3 10*3/uL (ref 0.7–4.0)
MCH: 21.3 pg — ABNORMAL LOW (ref 26.0–34.0)
MCHC: 30.3 g/dL (ref 30.0–36.0)
MCV: 70.1 fL — ABNORMAL LOW (ref 78.0–100.0)
Monocytes Absolute: 0.4 10*3/uL (ref 0.1–1.0)
Monocytes Relative: 7 % (ref 3–12)
Neutro Abs: 3.2 10*3/uL (ref 1.7–7.7)
Neutrophils Relative %: 63 % (ref 43–77)
Platelets: 222 10*3/uL (ref 150–400)
RBC: 3.95 MIL/uL (ref 3.87–5.11)
RDW: 17.5 % — ABNORMAL HIGH (ref 11.5–15.5)
WBC: 5.2 10*3/uL (ref 4.0–10.5)

## 2013-11-01 LAB — URINALYSIS, ROUTINE W REFLEX MICROSCOPIC
Bilirubin Urine: NEGATIVE
Glucose, UA: NEGATIVE mg/dL
Hgb urine dipstick: NEGATIVE
Ketones, ur: NEGATIVE mg/dL
Nitrite: POSITIVE — AB
Protein, ur: NEGATIVE mg/dL
Specific Gravity, Urine: 1.031 — ABNORMAL HIGH (ref 1.005–1.030)
Urobilinogen, UA: 1 mg/dL (ref 0.0–1.0)
pH: 6 (ref 5.0–8.0)

## 2013-11-01 LAB — URINE MICROSCOPIC-ADD ON

## 2013-11-01 LAB — COMPREHENSIVE METABOLIC PANEL
ALT: 6 U/L (ref 0–35)
AST: 13 U/L (ref 0–37)
Albumin: 3.4 g/dL — ABNORMAL LOW (ref 3.5–5.2)
Alkaline Phosphatase: 43 U/L (ref 39–117)
BUN: 10 mg/dL (ref 6–23)
CO2: 24 mEq/L (ref 19–32)
Calcium: 8.9 mg/dL (ref 8.4–10.5)
Chloride: 105 mEq/L (ref 96–112)
Creatinine, Ser: 0.93 mg/dL (ref 0.50–1.10)
GFR calc Af Amer: 89 mL/min — ABNORMAL LOW (ref >90)
GFR calc non Af Amer: 76 mL/min — ABNORMAL LOW (ref >90)
Glucose, Bld: 119 mg/dL — ABNORMAL HIGH (ref 70–99)
Potassium: 3.7 mEq/L (ref 3.5–5.1)
Sodium: 137 mEq/L (ref 135–145)
Total Bilirubin: 0.2 mg/dL — ABNORMAL LOW (ref 0.3–1.2)
Total Protein: 6.8 g/dL (ref 6.0–8.3)

## 2013-11-01 MED ORDER — CIPROFLOXACIN HCL 500 MG PO TABS: 500.0000 mg | ORAL_TABLET | Freq: Two times a day (BID) | ORAL | Status: DC

## 2013-11-01 MED ORDER — KETOROLAC TROMETHAMINE 60 MG/2ML IM SOLN
60.0000 mg | Freq: Once | INTRAMUSCULAR | Status: AC
  Administered 2013-11-01: 60 mg via INTRAMUSCULAR
  Filled 2013-11-01: qty 2

## 2013-11-01 NOTE — ED Notes (Signed)
Phelbotomy at bedside 

## 2013-11-01 NOTE — ED Provider Notes (Signed)
Medical screening examination/treatment/procedure(s) were performed by non-physician practitioner and as supervising physician I was immediately available for consultation/collaboration.  EKG Interpretation   None         Tito Ausmus S Kalab Camps, MD 11/01/13 1524 

## 2013-11-01 NOTE — ED Notes (Signed)
PA at bedside.

## 2013-11-01 NOTE — Progress Notes (Signed)
[facility] provided pt with a list of primary care resources, [facility] [employer] application and information on Medication Assistance Program through [employer].

## 2013-11-01 NOTE — ED Provider Notes (Signed)
CSN: [id number]     Arrival date & time 11/01/13  1023 History   First MD Initiated Contact with Patient 11/01/13 1056     Chief Complaint  Patient presents with  . Abdominal Pain  . Back Pain   (Consider location/radiation/quality/duration/timing/severity/associated sxs/prior Treatment) Patient is a 37 y.o. female presenting with abdominal pain and back pain. The history is provided by the patient and medical records.  Abdominal Pain Associated symptoms: dysuria   Back Pain Associated symptoms: dysuria    This is a 37 year old female with no significant past medical history, presenting to the ED for sudden onset of bilateral flank pain with radiation to her lower abdomen. The pain is constant, described as a sharp, stabbing sensation with occasional "tearing" sensations.  She also notes some urinary frequency and dysuria. No hematuria.  No vaginal complaints. Denies any strenuous activity to cause muscle strain. Patient does have a history of kidney infections but no history of kidney stones. She denies any fevers, sweats, or chills. Patient is status post tubal ligation.    Past Medical History  Diagnosis Date  . Seizures    Past Surgical History  Procedure Laterality Date  . Tubal ligation     No family history on file. History  Substance Use Topics  . Smoking status: Current Every Day Smoker -- 0.50 packs/day  . Smokeless tobacco: Not on file  . Alcohol Use: Yes     Comment: occasional   OB History   Grav Para Term Preterm Abortions TAB SAB Ect Mult Living                 Review of Systems  Genitourinary: Positive for dysuria, frequency and flank pain.  All other systems reviewed and are negative.    Allergies  Aspirin  Home Medications  No current outpatient prescriptions on file. BP 151/119  Pulse 63  Temp(Src) 98.8 F (37.1 C) (Oral)  Resp 16  SpO2 100%  LMP 10/15/2013  Physical Exam  Nursing note and vitals reviewed. Constitutional: She is oriented  to person, place, and time. She appears well-developed and well-nourished. No distress.  HENT:  Head: Normocephalic and atraumatic.  Mouth/Throat: Oropharynx is clear and moist.  Eyes: Conjunctivae and EOM are normal.  Neck: Normal range of motion. Neck supple.  Cardiovascular: Normal rate, regular rhythm and normal heart sounds.   Pulmonary/Chest: Effort normal and breath sounds normal. No respiratory distress. She has no wheezes.  Abdominal: Soft. Bowel sounds are normal. There is no tenderness. There is CVA tenderness. There is no rigidity, no guarding, no tenderness at McBurney's point and negative Murphy's sign.  Abdomen soft, non-distended; bilateral CVA TTP, R > L  Musculoskeletal: Normal range of motion.  Neurological: She is alert and oriented to person, place, and time.  Skin: Skin is warm and dry. She is not diaphoretic.  Psychiatric: She has a normal mood and affect.    ED Course  Procedures (including critical care time) Labs Review Labs Reviewed  URINALYSIS, ROUTINE W REFLEX MICROSCOPIC - Abnormal; Notable for the following:    APPearance CLOUDY (*)    Specific Gravity, Urine 1.031 (*)    Nitrite POSITIVE (*)    Leukocytes, UA MODERATE (*)    All other components within normal limits  CBC WITH DIFFERENTIAL - Abnormal; Notable for the following:    Hemoglobin 8.4 (*)    HCT 27.7 (*)    MCV 70.1 (*)    MCH 21.3 (*)    RDW 17.5 (*)  All other components within normal limits  COMPREHENSIVE METABOLIC PANEL - Abnormal; Notable for the following:    Glucose, Bld 119 (*)    Albumin 3.4 (*)    GFR calc non Af Amer 76 (*)    GFR calc Af Amer 89 (*)    All other components within normal limits  URINE MICROSCOPIC-ADD ON - Abnormal; Notable for the following:    Squamous Epithelial / LPF FEW (*)    Bacteria, UA MANY (*)    All other components within normal limits  URINE CULTURE   Imaging Review No results found.  EKG Interpretation   None       MDM   1. UTI  (lower urinary tract infection)   2. Dysuria    U/a nitrite +, culture pending.  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  Pain improved after toradol.  Pt afebrile, non-toxic appearing, NAD, VS stable- ok for discharge. Will be started on course of cipro for potential pyelonephritis.  FU with [facility] if no improvement in the next few days.  Discussed plan with pt, she agreed.  Return precautions advised.  Garlon Hatchet, PA-C 11/01/13 1307

## 2013-11-01 NOTE — ED Notes (Signed)
Pt states that last night she woke up and was having bilat lower abd pain that radiates to her lower back.  Pt states "it feels like i have pulled muscles". Pt doesn't remember lifting, turning or having any injury to cause pain.

## 2013-11-03 LAB — URINE CULTURE: Colony Count: >=100000

## 2013-11-04 ENCOUNTER — Telehealth (HOSPITAL_COMMUNITY): Payer: Self-pay | Admitting: *Deleted

## 2013-11-04 NOTE — ED Notes (Signed)
Patient notified.Rx called to pharmacy by [facility]

## 2013-11-04 NOTE — ED Notes (Signed)
Post ED Visit - Positive Culture Follow-up: Successful Patient Follow-Up   Positive Urine culture  [X]  Treated with Ciprofloxacin, organism resistant to prescribed antimicrobial  New antibiotic prescription: Cephalexin 500mg  po four times daily for 10 days  ED Provider: Francee Piccolo, PA-C  Christoper Fabian, PharmD, BCPS    Larena Sox 11/04/2013, 2:46 PM

## 2013-11-04 NOTE — Progress Notes (Signed)
ED Antimicrobial Stewardship Positive Culture Follow Up   Victoria Moore is an 37 y.o. female who presented to [facility] on 11/01/2013 with a chief complaint of  Chief Complaint  Patient presents with  . Abdominal Pain  . Back Pain    Recent Results (from the past 720 hour(s))  URINE CULTURE     Status: None   Collection Time    11/01/13 11:14 AM      Result Value Range Status   Specimen Description URINE, CLEAN CATCH   Final   Special Requests NONE   Final   Culture  Setup Time     Final   Value: 11/01/2013 16:17     Performed at [facility] Count     Final   Value: >=100,000 COLONIES/ML     Performed at [facility]   Culture     Final   Value: ESCHERICHIA COLI     Performed at [facility]   Report Status 11/03/2013 FINAL   Final   Organism ID, Bacteria ESCHERICHIA COLI   Final    [x]  Treated with Ciprofloxacin, organism resistant to prescribed antimicrobial  New antibiotic prescription: Cephalexin 500mg  po four times daily for 10 days  ED Provider: Francee Piccolo, PA-C   Christoper Fabian, PharmD, BCPS Clinical pharmacist, pager [phone] 11/04/2013, 11:44 AM

## 2014-03-29 ENCOUNTER — Encounter (HOSPITAL_COMMUNITY): Payer: Self-pay | Admitting: Emergency Medicine

## 2014-03-29 ENCOUNTER — Emergency Department (HOSPITAL_COMMUNITY)
Admission: EM | Admit: 2014-03-29 | Discharge: 2014-03-29 | Disposition: A | Discharge status: Home / Self Care | Payer: No Typology Code available for payment source | Attending: Emergency Medicine | Admitting: Emergency Medicine

## 2014-03-29 DIAGNOSIS — G40909 Epilepsy, unspecified, not intractable, without status epilepticus: Secondary | ICD-10-CM

## 2014-03-29 DIAGNOSIS — Z87891 Personal history of nicotine dependence: Secondary | ICD-10-CM | POA: Insufficient documentation

## 2014-03-29 DIAGNOSIS — F1210 Cannabis abuse, uncomplicated: Secondary | ICD-10-CM | POA: Insufficient documentation

## 2014-03-29 DIAGNOSIS — E663 Overweight: Secondary | ICD-10-CM | POA: Insufficient documentation

## 2014-03-29 DIAGNOSIS — R569 Unspecified convulsions: Secondary | ICD-10-CM

## 2014-03-29 DIAGNOSIS — G40309 Generalized idiopathic epilepsy and epileptic syndromes, not intractable, without status epilepticus: Secondary | ICD-10-CM | POA: Insufficient documentation

## 2014-03-29 LAB — URINALYSIS, ROUTINE W REFLEX MICROSCOPIC
Bilirubin Urine: NEGATIVE
Glucose, UA: NEGATIVE mg/dL
Hgb urine dipstick: NEGATIVE
Ketones, ur: NEGATIVE mg/dL
Nitrite: NEGATIVE
Protein, ur: NEGATIVE mg/dL
Specific Gravity, Urine: 1.03 (ref 1.005–1.030)
Urobilinogen, UA: 0.2 mg/dL (ref 0.0–1.0)
pH: 5.5 (ref 5.0–8.0)

## 2014-03-29 LAB — BASIC METABOLIC PANEL
BUN: 14 mg/dL (ref 6–23)
CO2: 24 mEq/L (ref 19–32)
Calcium: 8.5 mg/dL (ref 8.4–10.5)
Chloride: 108 mEq/L (ref 96–112)
Creatinine, Ser: 0.92 mg/dL (ref 0.50–1.10)
GFR calc Af Amer: 90 mL/min — ABNORMAL LOW (ref >90)
GFR calc non Af Amer: 77 mL/min — ABNORMAL LOW (ref >90)
Glucose, Bld: 86 mg/dL (ref 70–99)
Potassium: 3.9 mEq/L (ref 3.7–5.3)
Sodium: 143 mEq/L (ref 137–147)

## 2014-03-29 LAB — URINE MICROSCOPIC-ADD ON

## 2014-03-29 LAB — RAPID URINE DRUG SCREEN, HOSP PERFORMED
Amphetamines: NOT DETECTED
Barbiturates: NOT DETECTED
Benzodiazepines: NOT DETECTED
Cocaine: NOT DETECTED
Opiates: NOT DETECTED
Tetrahydrocannabinol: POSITIVE — AB

## 2014-03-29 LAB — CBC WITH DIFFERENTIAL/PLATELET
Basophils Absolute: 0 10*3/uL (ref 0.0–0.1)
Basophils Relative: 0 % (ref 0–1)
Eosinophils Absolute: 0.2 10*3/uL (ref 0.0–0.7)
Eosinophils Relative: 4 % (ref 0–5)
HCT: 27.2 % — ABNORMAL LOW (ref 36.0–46.0)
Hemoglobin: 8.1 g/dL — ABNORMAL LOW (ref 12.0–15.0)
Lymphocytes Relative: 33 % (ref 12–46)
Lymphs Abs: 1.8 10*3/uL (ref 0.7–4.0)
MCH: 20.6 pg — ABNORMAL LOW (ref 26.0–34.0)
MCHC: 29.8 g/dL — ABNORMAL LOW (ref 30.0–36.0)
MCV: 69 fL — ABNORMAL LOW (ref 78.0–100.0)
Monocytes Absolute: 0.4 10*3/uL (ref 0.1–1.0)
Monocytes Relative: 7 % (ref 3–12)
Neutro Abs: 3.1 10*3/uL (ref 1.7–7.7)
Neutrophils Relative %: 56 % (ref 43–77)
Platelets: 213 10*3/uL (ref 150–400)
RBC: 3.94 MIL/uL (ref 3.87–5.11)
RDW: 18.3 % — ABNORMAL HIGH (ref 11.5–15.5)
WBC: 5.5 10*3/uL (ref 4.0–10.5)

## 2014-03-29 LAB — ETHANOL: Alcohol, Ethyl (B): <11 mg/dL (ref 0–11)

## 2014-03-29 LAB — CBG MONITORING, ED: Glucose-Capillary: 86 mg/dL (ref 70–99)

## 2014-03-29 MED ORDER — SODIUM CHLORIDE 0.9 % IV SOLN
INTRAVENOUS | Status: DC
Start: 2014-03-29 — End: 2014-03-29
  Administered 2014-03-29 (×2): via INTRAVENOUS

## 2014-03-29 MED ORDER — SODIUM CHLORIDE 0.9 % IV SOLN
1900.0000 mg | Freq: Once | INTRAVENOUS | Status: AC
  Administered 2014-03-29: 1900 mg via INTRAVENOUS
  Filled 2014-03-29: qty 38

## 2014-03-29 MED ORDER — SODIUM CHLORIDE 0.9 % IV SOLN
1900.0000 mg | Freq: Once | INTRAVENOUS | Status: DC
  Filled 2014-03-29 (×2): qty 38

## 2014-03-29 MED ORDER — PHENYTOIN SODIUM EXTENDED 100 MG PO CAPS: 100.0000 mg | ORAL_CAPSULE | Freq: Every day | ORAL | Status: DC

## 2014-03-29 MED ORDER — OXYCODONE-ACETAMINOPHEN 5-325 MG PO TABS
1.0000 | ORAL_TABLET | Freq: Once | ORAL | Status: AC
  Administered 2014-03-29: 1 via ORAL
  Filled 2014-03-29: qty 1

## 2014-03-29 NOTE — ED Provider Notes (Signed)
CSN: [id number]     Arrival date & time 03/29/14  0177 History   First MD Initiated Contact with Patient 03/29/14 [phone]     Chief Complaint  Patient presents with  . Seizures     (Consider location/radiation/quality/duration/timing/severity/associated sxs/prior Treatment) Patient is a 38 y.o. female presenting with seizures. The history is provided by the patient.  Seizures  She reportedly had a seizure. This morning. She had slept normally last night and was sitting on a college, waiting to go to work, when she suddenly had a seizure. The patient cannot recall anything about it. Her roommate dropped her off here, then went to work. The patient states that her roommate is unavailable, to talk on the phone, so she cannot give a history of what happened this morning. The patient denies recent illnesses, including fever, chills, nausea, vomiting, weakness, dizziness, dysuria, or change in bowel habits. She was on Dilantin until 2 years ago, when her PCP. Recommended that she stop it. Previously, she had been treated with Dilantin for about 32 years. There are no other known modifying factors.  Past Medical History  Diagnosis Date  . Seizures    Past Surgical History  Procedure Laterality Date  . Tubal ligation     History reviewed. No pertinent family history. History  Substance Use Topics  . Smoking status: Former Smoker -- 0.50 packs/day  . Smokeless tobacco: Not on file  . Alcohol Use: Yes     Comment: occasional   OB History   Grav Para Term Preterm Abortions TAB SAB Ect Mult Living                 Review of Systems  Neurological: Positive for seizures.  All other systems reviewed and are negative.      Allergies  Aspirin  Home Medications   Current Outpatient Rx  Name  Route  Sig  Dispense  Refill  . ibuprofen (ADVIL,MOTRIN) 200 MG tablet   Oral   Take 600 mg by mouth every 6 (six) hours as needed for mild pain.         . phenytoin (DILANTIN) 100 MG ER  capsule   Oral   Take 1 capsule (100 mg total) by mouth at bedtime. Take 3 capsules (300 mg total) by mouth at bedtime   100 capsule   3    BP 118/78  Pulse 70  Temp(Src) 98.1 F (36.7 C) (Oral)  Resp 16  Ht 6\' 1"  (1.854 m)  Wt 280 lb (127.007 kg)  BMI 36.95 kg/m2  SpO2 100% Physical Exam  Nursing note and vitals reviewed. Constitutional: She is oriented to person, place, and time. She appears well-developed.  Overweight  HENT:  Head: Normocephalic and atraumatic.  No tongue abrasion.  Eyes: Conjunctivae and EOM are normal. Pupils are equal, round, and reactive to light.  Neck: Normal range of motion and phonation normal. Neck supple.  Cardiovascular: Normal rate, regular rhythm and intact distal pulses.   Pulmonary/Chest: Effort normal and breath sounds normal. She exhibits no tenderness.  Abdominal: Soft. She exhibits no distension. There is no tenderness. There is no guarding.  Musculoskeletal: Normal range of motion. She exhibits no tenderness.  Normal range of motion of arms, and legs  Neurological: She is alert and oriented to person, place, and time. She exhibits normal muscle tone.  Skin: Skin is warm and dry.  Psychiatric: She has a normal mood and affect. Her behavior is normal. Judgment and thought content normal.    ED  Course  Procedures (including critical care time)  Medications  0.9 %  sodium chloride infusion ( Intravenous New Bag/Given 03/29/14 1418)  oxyCODONE-acetaminophen (PERCOCET/ROXICET) 5-325 MG per tablet 1 tablet (1 tablet Oral Given 03/29/14 1114)  phenytoin (DILANTIN) 1,900 mg in sodium chloride 0.9 % 250 mL IVPB (0 mg Intravenous Stopped 03/29/14 1415)    Patient Vitals for the past 24 hrs:  BP Temp Temp src Pulse Resp SpO2 Height Weight  03/29/14 1445 118/78 mmHg - - 70 16 100 % - -  03/29/14 1436 132/84 mmHg - - 72 12 100 % - -  03/29/14 1400 113/64 mmHg - - 85 14 99 % - -  03/29/14 1345 125/81 mmHg - - 77 13 100 % - -  03/29/14 1331 144/84  mmHg 98.1 F (36.7 C) Oral 72 20 100 % - -  03/29/14 1330 144/84 mmHg - - 70 17 99 % - -  03/29/14 1315 143/83 mmHg - - 79 24 99 % - -  03/29/14 1300 151/77 mmHg - - 73 12 100 % - -  03/29/14 1245 143/72 mmHg - - 60 15 100 % - -  03/29/14 1230 145/82 mmHg - - 59 13 100 % - -  03/29/14 1215 142/77 mmHg - - 58 14 100 % - -  03/29/14 1145 147/77 mmHg - - 61 15 100 % - -  03/29/14 1115 132/79 mmHg - - - 15 - - -  03/29/14 1100 127/77 mmHg - - - 14 - - -  03/29/14 1045 130/59 mmHg - - - 14 - - -  03/29/14 1030 131/84 mmHg - - 61 15 100 % - -  03/29/14 1023 132/74 mmHg - - 61 - 100 % - -  03/29/14 1000 131/53 mmHg - - 65 13 100 % - -  03/29/14 0945 126/73 mmHg - - 66 15 100 % - -  03/29/14 0930 122/83 mmHg - - 68 15 100 % - -  03/29/14 0918 136/76 mmHg 98.1 F (36.7 C) Oral 70 15 100 % 6\' 1"  (1.854 m) 280 lb (127.007 kg)    2:57 PM Reevaluation with update and discussion. After initial assessment and treatment, an updated evaluation reveals no seizure in ED, bolused with Dilantin. [place] Review Labs Reviewed  CBC WITH DIFFERENTIAL - Abnormal; Notable for the following:    Hemoglobin 8.1 (*)    HCT 27.2 (*)    MCV 69.0 (*)    MCH 20.6 (*)    MCHC 29.8 (*)    RDW 18.3 (*)    All other components within normal limits  BASIC METABOLIC PANEL - Abnormal; Notable for the following:    GFR calc non Af Amer 77 (*)    GFR calc Af Amer 90 (*)    All other components within normal limits  URINALYSIS, ROUTINE W REFLEX MICROSCOPIC - Abnormal; Notable for the following:    APPearance CLOUDY (*)    Leukocytes, UA SMALL (*)    All other components within normal limits  URINE RAPID DRUG SCREEN (HOSP PERFORMED) - Abnormal; Notable for the following:    Tetrahydrocannabinol POSITIVE (*)    All other components within normal limits  URINE MICROSCOPIC-ADD ON - Abnormal; Notable for the following:    Bacteria, UA FEW (*)    All other components within normal limits  ETHANOL  CBG  MONITORING, ED     MDM   Final diagnoses:  Seizure  Seizure disorder    Recurrent seizure,  with history of epilepsy. She is stable for discharge with outpatient management. Medical evaluation, there is no obvious causative factor for seizure, today.  Nursing Notes Reviewed/ Care Coordinated Applicable Imaging Reviewed Interpretation of Laboratory Data incorporated into ED treatment  The patient appears reasonably screened and/or stabilized for discharge and I doubt any other medical condition or other [facility] requiring further screening, evaluation, or treatment in the ED at this time prior to discharge.  Plan: Home Medications- Dilantin; Home Treatments- rest; return here if the recommended treatment, does not improve the symptoms; Recommended follow up- PCP of choice asap    Richarda Blade, MD 03/29/14 1500

## 2014-03-29 NOTE — ED Notes (Signed)
Pt reports history of seizures years ago, she was taken off her medications because she wasn't having them anymore. Today her friend told her she witnessed her have seizure like activity. They dropped her off at the ed and she ambulated into triage independently. She is a&ox4 now, c/o headache and "feeling tired."

## 2014-03-29 NOTE — ED Notes (Signed)
Notified RN of CBG 86 

## 2014-03-29 NOTE — Discharge Instructions (Signed)
Epilepsy °Epilepsy is a disorder in which a person has repeated seizures over time. A seizure is a release of abnormal electrical activity in the brain. Seizures can cause a change in attention, behavior, or the ability to remain awake and alert (altered mental status). Seizures often involve uncontrollable shaking (convulsions).  °Most people with epilepsy lead normal lives. However, people with epilepsy are at an increased risk of falls, accidents, and injuries. Therefore, it is important to begin treatment right away. °CAUSES  °Epilepsy has many possible causes. Anything that disturbs the normal pattern of brain cell activity can lead to seizures. This may include:  °· Head injury. °· Birth trauma. °· High fever as a child. °· Stroke. °· Bleeding into or around the brain. °· Certain drugs. °· Prolonged low oxygen, such as what occurs after CPR efforts. °· Abnormal brain development. °· Certain illnesses, such as meningitis, encephalitis (brain infection), malaria, and other infections. °· An imbalance of nerve signaling chemicals (neurotransmitters).   °SIGNS AND SYMPTOMS  °The symptoms of a seizure can vary greatly from one person to another. Right before a seizure, you may have a warning (aura) that a seizure is about to occur. An aura may include the following symptoms: °· Fear or anxiety. °· Nausea. °· Feeling like the room is spinning (vertigo). °· Vision changes, such as seeing flashing lights or spots. °Common symptoms during a seizure include: °· Abnormal sensations, such as an abnormal smell or a bitter taste in the mouth.   °· Sudden, general body stiffness.   °· Convulsions that involve rhythmic jerking of the face, arm, or leg on one or both sides.   °· Sudden change in consciousness.   °· Appearing to be awake but not responding.   °· Appearing to be asleep but cannot be awakened.   °· Grimacing, chewing, lip smacking, drooling, tongue biting, or loss of bowel or bladder control. °After a seizure,  you may feel sleepy for a while.  °DIAGNOSIS  °Your health care provider will ask about your symptoms and take a medical history. Descriptions from any witnesses to your seizures will be very helpful in the diagnosis. A physical exam, including a detailed neurological exam, is necessary. Various tests may be done, such as:  °· An electroencephalogram (EEG). This is a painless test of your brain waves. In this test, a diagram is created of your brain waves. These diagrams can be interpreted by a specialist. °· An MRI of the brain.   °· A CT scan of the brain.   °· A spinal tap (lumbar puncture, LP). °· Blood tests to check for signs of infection or abnormal blood chemistry. °TREATMENT  °There is no cure for epilepsy, but it is generally treatable. Once epilepsy is diagnosed, it is important to begin treatment as soon as possible. For most people with epilepsy, seizures can be controlled with medicines. The following may also be used: °· A pacemaker for the brain (vagus nerve stimulator) can be used for people with seizures that are not well controlled by medicine. °· Surgery on the brain. °For some people, epilepsy eventually goes away. °HOME CARE INSTRUCTIONS  °· Follow your health care provider's recommendations on driving and safety in normal activities. °· Get enough rest. Lack of sleep can cause seizures. °· Only take over-the-counter or prescription medicines as directed by your health care provider. Take any prescribed medicine exactly as directed. °· Avoid any known triggers of your seizures. °· Keep a seizure diary. Record what you recall about any seizure, especially any possible trigger.   °· Make   sure the people you live and work with know that you are prone to seizures. They should receive instructions on how to help you. In general, a witness to a seizure should:   °· Cushion your head and body.   °· Turn you on your side.   °· Avoid unnecessarily restraining you.   °· Not place anything inside your  mouth.   °· Call for emergency medical help if there is any question about what has occurred.   °· Follow up with your health care provider as directed. You may need regular blood tests to monitor the levels of your medicine.   °SEEK MEDICAL CARE IF:  °· You develop signs of infection or other illness. This might increase the risk of a seizure.   °· You seem to be having more frequent seizures.   °· Your seizure pattern is changing.   °SEEK IMMEDIATE MEDICAL CARE IF:  °· You have a seizure that does not stop after a few moments.   °· You have a seizure that causes any difficulty in breathing.   °· You have a seizure that results in a very severe headache.   °· You have a seizure that leaves you with the inability to speak or use a part of your body.   °Document Released: 12/13/2005 Document Revised: 10/03/2013 Document Reviewed: 07/25/2013 °ExitCare® Patient Information ©2014 [employer]. ° °Seizure, Adult °A seizure is abnormal electrical activity in the brain. Seizures usually last from 30 seconds to 2 minutes. There are various types of seizures. °Before a seizure, you may have a warning sensation (aura) that a seizure is about to occur. An aura may include the following symptoms:  °· Fear or anxiety. °· Nausea. °· Feeling like the room is spinning (vertigo). °· Vision changes, such as seeing flashing lights or spots. °Common symptoms during a seizure include: °· A change in attention or behavior (altered mental status). °· Convulsions with rhythmic jerking movements. °· Drooling. °· Rapid eye movements. °· Grunting. °· Loss of bladder and bowel control. °· Bitter taste in the mouth. °· Tongue biting. °After a seizure, you may feel confused and sleepy. You may also have an injury resulting from convulsions during the seizure. °HOME CARE INSTRUCTIONS  °· If you are given medicines, take them exactly as prescribed by your health care provider. °· Keep all follow-up appointments as directed by your health care  provider. °· Do not swim or drive or engage in risky activity during which a seizure could cause further injury to you or others until your health care provider says it is OK. °· Get adequate rest. °· Teach friends and family what to do if you have a seizure. They should: °· Lay you on the ground to prevent a fall. °· Put a cushion under your head. °· Loosen any tight clothing around your neck. °· Turn you on your side. If vomiting occurs, this helps keep your airway clear. °· Stay with you until you recover. °· Know whether or not you need emergency care. °SEEK IMMEDIATE MEDICAL CARE IF: °· The seizure lasts longer than 5 minutes. °· The seizure is severe or you do not wake up immediately after the seizure. °· You have an altered mental status after the seizure. °· You are having more frequent or worsening seizures. °Someone should drive you to the emergency department or call local emergency services (911 in [place] °MAKE SURE YOU: °· Understand these instructions. °· Will watch your condition. °· Will get help right away if you are not doing well or get worse. °Document Released: 12/10/2000 Document   Revised: 10/03/2013 Document Reviewed: 07/25/2013 [facility] Patient Information 2014 [place], [place].   Emergency Department Resource Guide 1) Find a Doctor and Pay Out of Pocket Although you won't have to find out who is covered by your insurance plan, it is a good idea to ask around and get recommendations. You will then need to call the office and see if the doctor you have chosen will accept you as a new patient and what types of options they offer for patients who are self-pay. Some doctors offer discounts or will set up payment plans for their patients who do not have insurance, but you will need to ask so you aren't surprised when you get to your appointment.  2) Contact Your Local Health Department Not all health departments have doctors that can see patients for sick visits, but many do, so it is worth  a call to see if yours does. If you don't know where your local health department is, you can check in your phone book. The CDC also has a tool to help you locate your state's health department, and many state websites also have listings of all of their local health departments.  3) Find a [facility] If your illness is not likely to be very severe or complicated, you may want to try a walk in clinic. These are popping up all over the country in pharmacies, drugstores, and shopping centers. They're usually staffed by nurse practitioners or physician assistants that have been trained to treat common illnesses and complaints. They're usually fairly quick and inexpensive. However, if you have serious medical issues or chronic medical problems, these are probably not your best option.  No Primary Care Doctor: - Call Health Connect at  [phone] - they can help you locate a primary care doctor that  accepts your insurance, provides certain services, etc. - Physician Referral Service- [phone]  Chronic Pain Problems: Organization         Address  Phone   Notes  [facility]  [phone] Patients need to be referred by their primary care doctor.   Medication Assistance: Organization         Address  Phone   Notes  [facility] Medication [facility] [address] [place], [place] [address] [phone] --Must be a resident of [facility] -- Must have NO insurance coverage whatsoever (no Medicaid/ Medicare, etc.) -- The pt. MUST have a primary care doctor that directs their care regularly and follows them in the community   MedAssist  [phone]   [employer]  [phone]    Agencies that provide inexpensive medical care: Organization         Address  Phone   Notes  [place]  [phone]   [facility]    [phone]   [facility] [place] [place], [place]  [phone]   [place] [address] [place] [phone]   Planned Parenthood    [phone]   [facility]    [phone]   [place] [address], [place] Phone:  [phone], Fax:  [phone] Hours of Operation:  9 am - 6 pm, M-F.  Also accepts Medicaid/Medicare and self-pay.  [facility] for [address] [place], [address], [place] Phone: [phone], Fax: [phone]. Hours of Operation:  8:30 am -  5:30 pm, M-F.  Also accepts Medicaid and self-pay.  [facility] [address] [place] Phone: [phone]   [place], [place], [place] [phone], Ext. [phone] Mondays & Thursdays: 7-9 AM.  First 15 patients are seen on a first come, first serve basis.    [place] Providers:  Organization         Address  Phone   Notes  [facility] [address], [place] [phone] Also accepts self-pay patients.  [facility] [address], [place]  [phone]   [place], [address], [place] [phone]   [facility] [address] [place] [phone]   Lucianne Lei [address], [place]   [phone] Only accepts [place] Access [place] patients after they have their name applied to their card.   Self-Pay (no insurance) in [facility]:  Organization         Address  Phone   Notes  Sickle Cell Patients, [facility] [place] [phone]   [facility] [place] [phone]   [facility] [place]  [place], [place], [place] [phone]   [facility]/Dr. Osei-Bonsu  [address], [place] or [address], [place] [phone] Phone number for both [place] and [place] locations is the same.  Urgent Medical and [facility] [address], [place] [phone]   [facility] [address], [place] or [address] [phone]   [facility] [address] [place] [phone], phone; [phone], fax Sees patients 1st and 3rd Saturday of every month.  Must not qualify for public or private insurance (i.e. Medicaid, Medicare, [place] Health Choice, Veterans' Benefits)  Household income should be no more than 200% of the poverty level The clinic cannot treat you if you are pregnant or think you are pregnant  Sexually transmitted diseases are not treated at the clinic.    Dental Care: Organization         Address  Phone  Notes  [facility] Department of [facility] [phone] Accepts children up to age 54 who are enrolled in [place] or [place]; pregnant women with a Medicaid card; and children who have applied for Medicaid or [place] Health Choice, but were declined, whose parents can pay a reduced fee at time of service.  [facility] Department of [facility]  [address], [place] [phone] Accepts children up to age 35 who are enrolled in [place] or [place]; pregnant women with a Medicaid card; and children who have applied for Medicaid or [place] Health Choice, but were declined, whose parents can pay a reduced fee at time of service.  [place] Adult Dental Access PROGRAM  [place] [phone] Patients are seen by appointment only. Walk-ins are not accepted. [place] will see patients 36 years of age and older. Monday - Tuesday (8am-5pm) Most Wednesdays (8:30-5pm) $30 per visit, cash only  [facility] Adult Dental Access PROGRAM  [address], [facility] [phone]  Patients are seen by appointment only. Walk-ins are  not accepted. [place] will see patients 59 years of age and older. One Wednesday Evening (Monthly: Volunteer Based).  $30 per visit, cash only  [place]  [phone] for adults; Children under age 49, call Graduate Pediatric Dentistry at [phone]. Children aged 18-14, please call [phone] to request a pediatric application.  Dental services are provided in all areas of dental care including fillings, crowns and bridges, complete and partial dentures, implants, gum treatment, root canals, and extractions. Preventive care is also provided. Treatment is provided to both adults and children. Patients are selected via a lottery and there is often a waiting list.   [facility] [address] [place]  [phone] [email or web address]   [facility] [address] [place], [place] [phone], Ext. [phone] Second and Fourth Thursday of each month, opens at 6:30 AM; Clinic ends at 9 AM.  Patients are seen on a first-come first-served basis, and a limited number are seen during each clinic.   [facility]  [address] Hillard Danker [place], [place] [phone]   Eligibility Requirements You must have lived in [place], [place], or [place] counties for at least the last three months.   You cannot be eligible for state or federal sponsored [employer], including [employer], [place], or [employer].   You generally cannot be eligible for healthcare insurance through your employer.    How to apply: Eligibility screenings are held every Tuesday and Wednesday afternoon from 1:00 pm until 4:00 pm. You do not need an appointment for the interview!  [facility] [address] [place], [place]   [place]  [place]  [place]   [phone]    Behavioral Health Resources in the Community: Intensive Outpatient Programs Organization         Address  Phone  Notes  [place] [facility]. [address] [place], [place] [phone]   [facility] Outpatient [address] [place], [place]   ADS: Alcohol & Drug Svcs [address] [place], [place]   [place] [address],  [place], [place] or [phone]   Substance Abuse Resources Organization         Address  Phone  Notes  Alcohol and Drug Services  [phone]   [place]  [phone]   [facility] [place]   Chinita Pester  [phone]   Residential & Outpatient Substance Abuse Program  [phone]   Psychological Services Organization         Address  Phone  Notes  [facility] [place]  [place]  [phone]   [place] [address], [place] or [phone]    Mobile Crisis Teams Organization         Address  Phone  Notes  [facility], Mobile Crisis Care Unit  [phone]   [facility]  [address] [place], [place]   [place] [address] [place] [phone]    Self-Help/Support Groups Organization         Address  Phone             Notes  [place]. of [place] - variety of support groups  [place] Call for more information  Narcotics Anonymous (NA), Caring  Services [address] [employer] [place]  2 meetings at this location   Residential Facilities manager         Address  Phone  Notes  [facility] [place],    [place]  [phone]   [facility]  [address] [place] [id number], [place], [place]   [place] Trainer, [place] [phone] Admissions: 8am-3pm M-F   Incentives Substance [place] [address],    [place], [place] [phone]   [facility] [address] [place], [place], [place]   [facility] [address],  [place], [place]   [facility] - Intensive Outpatient [address] Kristeen [address], [place], [place]   [facility] [place].,  [place], [place] [phone] or [phone]   [facility] Treatment Services (RTS) [address] [place], [place] Accepts Medicaid  [facility] [address],  [place] [phone] Substance Abuse/Addiction Treatment   [facility] Organization         Address  Phone  Notes  [facility]  [phone]   Domenic Schwab, PhD [address] [place], [place]   [phone] or [phone]   [place], [place] [phone]   [facility] [address], [place], [place] [phone] Insurance/Medicaid/sponsorship through [facility] and Families [address]                                    [place], [place] [phone] [place] [address], [place] [phone]    Dr. Adele Schilder  [phone]   [facility] Dept. 1) [address], [place] 2) [place] 3)  [place] [address], [place] [phone]  [phone]   [place] [phone] or [phone] (After Hours)

## 2014-03-29 NOTE — ED Notes (Signed)
Dr. Wentz at bedside. 

## 2014-03-29 NOTE — ED Notes (Signed)
Pt states roommate witnessed seizure this am and brought her to the ED.  Pt states headache is 10/10 in pain.  Pt is unsure for the length of period she was down.  Pt was taken off of seizure medication 2 years ago by PCP, was taking Dilantin.

## 2014-03-30 NOTE — Progress Notes (Signed)
ED CM received call from [facility] from [place] to verify discharge Dilantin prescription. Verified as per AVS in record. No further CM needs identified

## 2014-04-02 ENCOUNTER — Emergency Department (HOSPITAL_COMMUNITY)
Admission: EM | Admit: 2014-04-02 | Discharge: 2014-04-02 | Disposition: A | Discharge status: Home / Self Care | Payer: No Typology Code available for payment source | Attending: Emergency Medicine | Admitting: Emergency Medicine

## 2014-04-02 ENCOUNTER — Encounter (HOSPITAL_COMMUNITY): Payer: Self-pay | Admitting: Emergency Medicine

## 2014-04-02 ENCOUNTER — Emergency Department (HOSPITAL_COMMUNITY): Payer: No Typology Code available for payment source

## 2014-04-02 DIAGNOSIS — Z79899 Other long term (current) drug therapy: Secondary | ICD-10-CM | POA: Insufficient documentation

## 2014-04-02 DIAGNOSIS — G40909 Epilepsy, unspecified, not intractable, without status epilepticus: Secondary | ICD-10-CM | POA: Insufficient documentation

## 2014-04-02 DIAGNOSIS — Z3202 Encounter for pregnancy test, result negative: Secondary | ICD-10-CM | POA: Insufficient documentation

## 2014-04-02 DIAGNOSIS — G4700 Insomnia, unspecified: Secondary | ICD-10-CM | POA: Insufficient documentation

## 2014-04-02 DIAGNOSIS — Z87891 Personal history of nicotine dependence: Secondary | ICD-10-CM | POA: Insufficient documentation

## 2014-04-02 DIAGNOSIS — G43909 Migraine, unspecified, not intractable, without status migrainosus: Secondary | ICD-10-CM | POA: Insufficient documentation

## 2014-04-02 LAB — URINALYSIS, ROUTINE W REFLEX MICROSCOPIC
Bilirubin Urine: NEGATIVE
Glucose, UA: NEGATIVE mg/dL
Hgb urine dipstick: NEGATIVE
Ketones, ur: 15 mg/dL — AB
Nitrite: NEGATIVE
Protein, ur: NEGATIVE mg/dL
Specific Gravity, Urine: 1.024 (ref 1.005–1.030)
Urobilinogen, UA: 0.2 mg/dL (ref 0.0–1.0)
pH: 7 (ref 5.0–8.0)

## 2014-04-02 LAB — CBC WITH DIFFERENTIAL/PLATELET
Basophils Absolute: 0 10*3/uL (ref 0.0–0.1)
Basophils Relative: 0 % (ref 0–1)
Eosinophils Absolute: 0.2 10*3/uL (ref 0.0–0.7)
Eosinophils Relative: 5 % (ref 0–5)
HCT: 28.6 % — ABNORMAL LOW (ref 36.0–46.0)
Hemoglobin: 8.5 g/dL — ABNORMAL LOW (ref 12.0–15.0)
Lymphocytes Relative: 24 % (ref 12–46)
Lymphs Abs: 1 10*3/uL (ref 0.7–4.0)
MCH: 20.3 pg — ABNORMAL LOW (ref 26.0–34.0)
MCHC: 29.7 g/dL — ABNORMAL LOW (ref 30.0–36.0)
MCV: 68.3 fL — ABNORMAL LOW (ref 78.0–100.0)
Monocytes Absolute: 0.4 10*3/uL (ref 0.1–1.0)
Monocytes Relative: 9 % (ref 3–12)
Neutro Abs: 2.4 10*3/uL (ref 1.7–7.7)
Neutrophils Relative %: 62 % (ref 43–77)
Platelets: 209 10*3/uL (ref 150–400)
RBC: 4.19 MIL/uL (ref 3.87–5.11)
RDW: 18.1 % — ABNORMAL HIGH (ref 11.5–15.5)
WBC: 4 10*3/uL (ref 4.0–10.5)

## 2014-04-02 LAB — BASIC METABOLIC PANEL
BUN: 9 mg/dL (ref 6–23)
CO2: 24 mEq/L (ref 19–32)
Calcium: 8.8 mg/dL (ref 8.4–10.5)
Chloride: 105 mEq/L (ref 96–112)
Creatinine, Ser: 0.94 mg/dL (ref 0.50–1.10)
GFR calc Af Amer: 87 mL/min — ABNORMAL LOW (ref >90)
GFR calc non Af Amer: 75 mL/min — ABNORMAL LOW (ref >90)
Glucose, Bld: 119 mg/dL — ABNORMAL HIGH (ref 70–99)
Potassium: 3.6 mEq/L — ABNORMAL LOW (ref 3.7–5.3)
Sodium: 143 mEq/L (ref 137–147)

## 2014-04-02 LAB — URINE MICROSCOPIC-ADD ON

## 2014-04-02 LAB — PREGNANCY, URINE: Preg Test, Ur: NEGATIVE

## 2014-04-02 MED ORDER — DIPHENHYDRAMINE HCL 50 MG/ML IJ SOLN
25.0000 mg | Freq: Once | INTRAMUSCULAR | Status: AC
  Administered 2014-04-02: 25 mg via INTRAVENOUS
  Filled 2014-04-02: qty 1

## 2014-04-02 MED ORDER — METOCLOPRAMIDE HCL 5 MG/ML IJ SOLN
10.0000 mg | Freq: Once | INTRAMUSCULAR | Status: AC
  Administered 2014-04-02: 10 mg via INTRAVENOUS
  Filled 2014-04-02: qty 2

## 2014-04-02 MED ORDER — KETOROLAC TROMETHAMINE 30 MG/ML IJ SOLN
30.0000 mg | Freq: Once | INTRAMUSCULAR | Status: AC
  Administered 2014-04-02: 30 mg via INTRAVENOUS
  Filled 2014-04-02: qty 1

## 2014-04-02 MED ORDER — PROMETHAZINE HCL 25 MG PO TABS: 25.0000 mg | ORAL_TABLET | Freq: Four times a day (QID) | ORAL | Status: DC | PRN

## 2014-04-02 MED ORDER — TRAMADOL HCL 50 MG PO TABS: 50.0000 mg | ORAL_TABLET | Freq: Four times a day (QID) | ORAL | Status: DC | PRN

## 2014-04-02 MED ORDER — SODIUM CHLORIDE 0.9 % IV BOLUS (SEPSIS)
1000.0000 mL | Freq: Once | INTRAVENOUS | Status: AC
  Administered 2014-04-02: 1000 mL via INTRAVENOUS

## 2014-04-02 NOTE — Discharge Instructions (Signed)
Migraine Headache A migraine headache is an intense, throbbing pain on one or both sides of your head. A migraine can last for 30 minutes to several hours. CAUSES  The exact cause of a migraine headache is not always known. However, a migraine may be caused when nerves in the brain become irritated and release chemicals that cause inflammation. This causes pain. Certain things may also trigger migraines, such as:  Alcohol.  Smoking.  Stress.  Menstruation.  Aged cheeses.  Foods or drinks that contain nitrates, glutamate, aspartame, or tyramine.  Lack of sleep.  Chocolate.  Caffeine.  Hunger.  Physical exertion.  Fatigue.  Medicines used to treat chest pain (nitroglycerine), birth control pills, estrogen, and some blood pressure medicines. SIGNS AND SYMPTOMS  Pain on one or both sides of your head.  Pulsating or throbbing pain.  Severe pain that prevents daily activities.  Pain that is aggravated by any physical activity.  Nausea, vomiting, or both.  Dizziness.  Pain with exposure to bright lights, loud noises, or activity.  General sensitivity to bright lights, loud noises, or smells. Before you get a migraine, you may get warning signs that a migraine is coming (aura). An aura may include:  Seeing flashing lights.  Seeing bright spots, halos, or zig-zag lines.  Having tunnel vision or blurred vision.  Having feelings of numbness or tingling.  Having trouble talking.  Having muscle weakness. DIAGNOSIS  A migraine headache is often diagnosed based on:  Symptoms.  Physical exam.  A CT scan or MRI of your head. These imaging tests cannot diagnose migraines, but they can help rule out other causes of headaches. TREATMENT Medicines may be given for pain and nausea. Medicines can also be given to help prevent recurrent migraines.  HOME CARE INSTRUCTIONS  Only take over-the-counter or prescription medicines for pain or discomfort as directed by your  health care provider. The use of long-term narcotics is not recommended.  Lie down in a dark, quiet room when you have a migraine.  Keep a journal to find out what may trigger your migraine headaches. For example, write down:  What you eat and drink.  How much sleep you get.  Any change to your diet or medicines.  Limit alcohol consumption.  Quit smoking if you smoke.  Get 7 9 hours of sleep, or as recommended by your health care provider.  Limit stress.  Keep lights dim if bright lights bother you and make your migraines worse. SEEK IMMEDIATE MEDICAL CARE IF:   Your migraine becomes severe.  You have a fever.  You have a stiff neck.  You have vision loss.  You have muscular weakness or loss of muscle control.  You start losing your balance or have trouble walking.  You feel faint or pass out.  You have severe symptoms that are different from your first symptoms. MAKE SURE YOU:   Understand these instructions.  Will watch your condition.  Will get help right away if you are not doing well or get worse. Document Released: 12/13/2005 Document Revised: 10/03/2013 Document Reviewed: 08/20/2013 [facility] Patient Information 2014 [place].  Epilepsy Epilepsy is a disorder in which a person has repeated seizures over time. A seizure is a release of abnormal electrical activity in the brain. Seizures can cause a change in attention, behavior, or the ability to remain awake and alert (altered mental status). Seizures often involve uncontrollable shaking (convulsions).  Most people with epilepsy lead normal lives. However, people with epilepsy are at an increased  risk of falls, accidents, and injuries. Therefore, it is important to begin treatment right away. CAUSES  Epilepsy has many possible causes. Anything that disturbs the normal pattern of brain cell activity can lead to seizures. This may include:   Head injury.  Birth trauma.  High fever as a  child.  Stroke.  Bleeding into or around the brain.  Certain drugs.  Prolonged low oxygen, such as what occurs after CPR efforts.  Abnormal brain development.  Certain illnesses, such as meningitis, encephalitis (brain infection), malaria, and other infections.  An imbalance of nerve signaling chemicals (neurotransmitters).  SIGNS AND SYMPTOMS  The symptoms of a seizure can vary greatly from one person to another. Right before a seizure, you may have a warning (aura) that a seizure is about to occur. An aura may include the following symptoms:  Fear or anxiety.  Nausea.  Feeling like the room is spinning (vertigo).  Vision changes, such as seeing flashing lights or spots. Common symptoms during a seizure include:  Abnormal sensations, such as an abnormal smell or a bitter taste in the mouth.   Sudden, general body stiffness.   Convulsions that involve rhythmic jerking of the face, arm, or leg on one or both sides.   Sudden change in consciousness.   Appearing to be awake but not responding.   Appearing to be asleep but cannot be awakened.   Grimacing, chewing, lip smacking, drooling, tongue biting, or loss of bowel or bladder control. After a seizure, you may feel sleepy for a while. DIAGNOSIS  Your health care provider will ask about your symptoms and take a medical history. Descriptions from any witnesses to your seizures will be very helpful in the diagnosis. A physical exam, including a detailed neurological exam, is necessary. Various tests may be done, such as:   An electroencephalogram (EEG). This is a painless test of your brain waves. In this test, a diagram is created of your brain waves. These diagrams can be interpreted by a specialist.  An MRI of the brain.   A CT scan of the brain.   A spinal tap (lumbar puncture, LP).  Blood tests to check for signs of infection or abnormal blood chemistry. TREATMENT  There is no cure for epilepsy, but it  is generally treatable. Once epilepsy is diagnosed, it is important to begin treatment as soon as possible. For most people with epilepsy, seizures can be controlled with medicines. The following may also be used:  A pacemaker for the brain (vagus nerve stimulator) can be used for people with seizures that are not well controlled by medicine.  Surgery on the brain. For some people, epilepsy eventually goes away. HOME CARE INSTRUCTIONS   Follow your health care provider's recommendations on driving and safety in normal activities.  Get enough rest. Lack of sleep can cause seizures.  Only take over-the-counter or prescription medicines as directed by your health care provider. Take any prescribed medicine exactly as directed.  Avoid any known triggers of your seizures.  Keep a seizure diary. Record what you recall about any seizure, especially any possible trigger.   Make sure the people you live and work with know that you are prone to seizures. They should receive instructions on how to help you. In general, a witness to a seizure should:   Cushion your head and body.   Turn you on your side.   Avoid unnecessarily restraining you.   Not place anything inside your mouth.   Call for emergency medical  help if there is any question about what has occurred.   Follow up with your health care provider as directed. You may need regular blood tests to monitor the levels of your medicine.  SEEK MEDICAL CARE IF:   You develop signs of infection or other illness. This might increase the risk of a seizure.   You seem to be having more frequent seizures.   Your seizure pattern is changing.  SEEK IMMEDIATE MEDICAL CARE IF:   You have a seizure that does not stop after a few moments.   You have a seizure that causes any difficulty in breathing.   You have a seizure that results in a very severe headache.   You have a seizure that leaves you with the inability to speak or  use a part of your body.  Document Released: 12/13/2005 Document Revised: 10/03/2013 Document Reviewed: 07/25/2013 [facility] Patient Information 2014 [place].

## 2014-04-02 NOTE — ED Provider Notes (Signed)
CSN: [id number]     Arrival date & time 04/02/14  0836 History   First MD Initiated Contact with Patient 04/02/14 [phone]     Chief Complaint  Patient presents with  . Migraine  . Dizziness     (Consider location/radiation/quality/duration/timing/severity/associated sxs/prior Treatment) HPI  She presents to the emergency department for evaluation of her dizziness and headache. She was seen here on April 3 after having had a seizure. 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 2 years ago her primary care doctor discontinued her Dilantin and on April 3 the emergency provider restarted her on Dilantin which she has been taking. Before a few days ago her last seizure was 10 years prior. She reports having headache and dizziness while in the emergency department: She was discharged she no longer had the dizziness. Headache. Both had resolved a few days ago. Yesterday the headache started and today the dizziness has recurred. She describes the dizziness as spinning in circles, nothing makes it better or worse.. She also reports insomnia since her last visit only sleeping 2 hrs a night, unknown reason. She doesn't feel that she has been stressed at all. The headache worsens with noise and light.  She has also had some nausea without vomiting. She has not had any fevers or neck pain. She feels generally weak. Otherwise no ear pain, sore throat, CP, cough, abdominal pain, dysuria.    Past Medical History  Diagnosis Date  . Seizures    Past Surgical History  Procedure Laterality Date  . Tubal ligation     History reviewed. No pertinent family history. History  Substance Use Topics  . Smoking status: Former Smoker -- 0.50 packs/day  . Smokeless tobacco: Not on file  . Alcohol Use: Yes     Comment: occasional   OB History   Grav Para Term Preterm Abortions TAB SAB Ect Mult Living                 Review of Systems   Review of Systems  Gen: no weight  loss, fevers, chills, night sweats  Eyes: no discharge or drainage, no occular pain or visual changes  Nose: no epistaxis or rhinorrhea  Mouth: no dental pain, no sore throat  Neck: no neck pain  Lungs:No wheezing, coughing or hemoptysis CV: no chest pain, palpitations, dependent edema or orthopnea  Abd: no abdominal pain, vomiting, diarrhea  +nausea GU: no dysuria or gross hematuria  MSK:  No muscle weakness or pain Neuro: , no focal neurologic deficits, + headache and dizziness Skin: no rash or wounds Psyche: no complaints    Allergies  Aspirin  Home Medications   Current Outpatient Rx  Name  Route  Sig  Dispense  Refill  . aspirin-acetaminophen-caffeine (EXCEDRIN MIGRAINE) 250-250-65 MG per tablet   Oral   Take 2 tablets by mouth every 6 (six) hours as needed for headache.         . diphenhydrAMINE (BENADRYL) 25 mg capsule   Oral   Take 50 mg by mouth every 6 (six) hours as needed for allergies.         Marland Kitchen ibuprofen (ADVIL,MOTRIN) 200 MG tablet   Oral   Take 600 mg by mouth every 6 (six) hours as needed for mild pain.         . phenytoin (DILANTIN) 100 MG ER capsule   Oral   Take 300 mg by mouth at bedtime.  BP 119/79  Temp(Src) 98.3 F (36.8 C) (Oral)  Resp 14  SpO2 100%  LMP 03/17/2014 Physical Exam  Nursing note and vitals reviewed. Constitutional: She is oriented to person, place, and time. She appears well-developed and well-nourished. No distress.  HENT:  Head: Normocephalic and atraumatic.  Right Ear: External ear normal.  Left Ear: External ear normal.  Nose: Nose normal.  Mouth/Throat: Oropharynx is clear and moist. No oropharyngeal exudate.  Pt reports increased pain when palpating left parietal and frontal scalp. No pain over temple.  Eyes: EOM are normal. Pupils are equal, round, and reactive to light.  Neck: Normal range of motion. Neck supple.  Cardiovascular: Normal rate and regular rhythm.   Pulmonary/Chest: Effort normal.  No respiratory distress. She has no wheezes.  Abdominal: Soft. There is no tenderness.  Neurological: She is alert and oriented to person, place, and time. She has normal strength. No cranial nerve deficit or sensory deficit. Coordination and gait normal. GCS eye subscore is 4. GCS verbal subscore is 5. GCS motor subscore is 6.  Skin: Skin is warm and dry.  Psychiatric: She has a normal mood and affect. Her behavior is normal. Judgment and thought content normal.    ED Course  Procedures (including critical care time) Labs Review Labs Reviewed  URINALYSIS, ROUTINE W REFLEX MICROSCOPIC - Abnormal; Notable for the following:    APPearance HAZY (*)    Ketones, ur 15 (*)    Leukocytes, UA MODERATE (*)    All other components within normal limits  CBC WITH DIFFERENTIAL - Abnormal; Notable for the following:    Hemoglobin 8.5 (*)    HCT 28.6 (*)    MCV 68.3 (*)    MCH 20.3 (*)    MCHC 29.7 (*)    RDW 18.1 (*)    All other components within normal limits  BASIC METABOLIC PANEL - Abnormal; Notable for the following:    Potassium 3.6 (*)    Glucose, Bld 119 (*)    GFR calc non Af Amer 75 (*)    GFR calc Af Amer 87 (*)    All other components within normal limits  URINE MICROSCOPIC-ADD ON - Abnormal; Notable for the following:    Squamous Epithelial / LPF MANY (*)    Bacteria, UA MANY (*)    All other components within normal limits  URINE CULTURE  PREGNANCY, URINE  PHENYTOIN LEVEL, FREE   Imaging Review Ct Head Wo Contrast  04/02/2014   CLINICAL DATA:  Dizziness and headache.  EXAM: CT HEAD WITHOUT CONTRAST  TECHNIQUE: Contiguous axial images were obtained from the base of the skull through the vertex without intravenous contrast.  COMPARISON:  None.  FINDINGS: Ventricle size is normal. Negative for acute or chronic infarction. Negative for hemorrhage or fluid collection. Negative for mass or edema. No shift of the midline structures.  Calvarium is intact.  IMPRESSION: Normal    Electronically Signed   By: Franchot Gallo M.D.   On: 04/02/2014 09:46     EKG Interpretation None      MDM   Final diagnoses:  Migraine    Patient given IV saline, toradol, benadryl, and reglan. Her symptoms completely resolved: dizziness and headache. Her Head CT is also normal. I am currently waiting for labs to result.    Pt does have some leukocytes in her urine, with squamous cells and WBC's will culture urine before treating since asymptomatic.  Will rx pain and medication. Pt still needs to continue Dilantin and  follow-up with neuro.  38 y.o.[name]  wth headache. No neurological deficits and normal neuro exam. Patient can ambulate without any ataxia or gait abnormalities.  No fever, neck pain, confusion, night sweats, weight loss, h/o cancer, IVDU. The patient was given IV fluids and medications in the ER to help manage pain. Pain greatly improved with interventions. I do not feel that further work-up is needed at this time. I  discussed the diagnosis, treatment and plan with patient.   Patient Plan 1. Medications:  pain medication, nausea and usual home medications prn 2. Treatment: rest, drink plenty of fluids 3. Follow Up: Please followup with your primary doctor for discussion of your diagnoses and further evaluation after today's visit; if you do not have a primary care doctor use the resource guide provided to find one  Filed Vitals:   04/02/14 0846  BP: 119/79  Temp: 98.3 F (36.8 C)  Resp: 14    Patient/guardian has voiced understanding and agreed to follow-up with the PCP or specialist.    Linus Mako, PA-C 04/02/14 [place] Kasidy Gianino, PA-C 04/02/14 1304

## 2014-04-02 NOTE — ED Notes (Signed)
Returned  From ct scan

## 2014-04-02 NOTE — ED Notes (Signed)
Patient transported to CT 

## 2014-04-02 NOTE — ED Notes (Signed)
Pt here from home with c/o dizziness and migraine since her seizure on Friday , pt states that she has only been sleeping about 2 hours a night

## 2014-04-03 LAB — URINE CULTURE: Colony Count: 50000

## 2014-04-05 NOTE — ED Provider Notes (Signed)
Medical screening examination/treatment/procedure(s) were performed by non-physician practitioner and as supervising physician I was immediately available for consultation/collaboration.   EKG Interpretation None       Virgel Manifold, MD 04/05/14 1445

## 2014-04-06 LAB — PHENYTOIN LEVEL, FREE AND TOTAL
Phenytoin Bound: 7.8 mg/L
Phenytoin, Free: 0.6 mg/L — ABNORMAL LOW (ref 1.0–2.0)
Phenytoin, Total: 8.4 mg/L — ABNORMAL LOW (ref 10.0–20.0)

## 2014-05-08 ENCOUNTER — Emergency Department (HOSPITAL_COMMUNITY)
Admission: EM | Admit: 2014-05-08 | Discharge: 2014-05-08 | Disposition: A | Discharge status: Home / Self Care | Payer: No Typology Code available for payment source | Attending: Emergency Medicine | Admitting: Emergency Medicine

## 2014-05-08 ENCOUNTER — Encounter (HOSPITAL_COMMUNITY): Payer: Self-pay | Admitting: Emergency Medicine

## 2014-05-08 DIAGNOSIS — Z23 Encounter for immunization: Secondary | ICD-10-CM | POA: Insufficient documentation

## 2014-05-08 DIAGNOSIS — R51 Headache: Secondary | ICD-10-CM | POA: Insufficient documentation

## 2014-05-08 DIAGNOSIS — Z79899 Other long term (current) drug therapy: Secondary | ICD-10-CM | POA: Insufficient documentation

## 2014-05-08 DIAGNOSIS — S61209A Unspecified open wound of unspecified finger without damage to nail, initial encounter: Secondary | ICD-10-CM | POA: Insufficient documentation

## 2014-05-08 DIAGNOSIS — Z7982 Long term (current) use of aspirin: Secondary | ICD-10-CM | POA: Insufficient documentation

## 2014-05-08 DIAGNOSIS — Y9389 Activity, other specified: Secondary | ICD-10-CM | POA: Insufficient documentation

## 2014-05-08 DIAGNOSIS — S61219A Laceration without foreign body of unspecified finger without damage to nail, initial encounter: Secondary | ICD-10-CM

## 2014-05-08 DIAGNOSIS — W261XXA Contact with sword or dagger, initial encounter: Secondary | ICD-10-CM | POA: Insufficient documentation

## 2014-05-08 DIAGNOSIS — Y9289 Other specified places as the place of occurrence of the external cause: Secondary | ICD-10-CM | POA: Insufficient documentation

## 2014-05-08 DIAGNOSIS — Z87891 Personal history of nicotine dependence: Secondary | ICD-10-CM | POA: Insufficient documentation

## 2014-05-08 DIAGNOSIS — R569 Unspecified convulsions: Secondary | ICD-10-CM | POA: Insufficient documentation

## 2014-05-08 DIAGNOSIS — W260XXA Contact with knife, initial encounter: Secondary | ICD-10-CM | POA: Insufficient documentation

## 2014-05-08 MED ORDER — TETANUS-DIPHTH-ACELL PERTUSSIS 5-2.5-18.5 LF-MCG/0.5 IM SUSP
0.5000 mL | Freq: Once | INTRAMUSCULAR | Status: AC
  Administered 2014-05-08: 0.5 mL via INTRAMUSCULAR
  Filled 2014-05-08: qty 0.5

## 2014-05-08 MED ORDER — ACETAMINOPHEN 325 MG PO TABS
650.0000 mg | ORAL_TABLET | Freq: Once | ORAL | Status: AC
  Administered 2014-05-08: 650 mg via ORAL
  Filled 2014-05-08: qty 2

## 2014-05-08 NOTE — ED Provider Notes (Signed)
CSN: [id number]     Arrival date & time 05/08/14  0920 History  This chart was scribed for non-physician practitioner working with Tanna Furry, MD, by Erling Conte, ED Scribe. This patient was seen in room TR09C/TR09C and the patient's care was started at 9:43 AM     Chief Complaint  Patient presents with  . finger laceration      Patient is a 38 y.o. female presenting with skin laceration. The history is provided by the patient. No language interpreter was used.  Laceration Location:  Finger Finger laceration location:  L ring finger Bleeding: controlled   Time since incident:  8 hours Laceration mechanism:  Knife  HPI Comments: Victoria Moore is a 38 y.o. female who presents to the Emergency Department complaining of a laceration to her left ring finger around 1:00 AM. Patient states she was cutting meat at work with a knife when she cut the finger. Patient states that she is having an associated headache. There is no active bleeding and the bleeding is controlled by a gauze wrap. Patient is unsure of when she received her last t-dap vaccination.   Past Medical History  Diagnosis Date  . Seizures    Past Surgical History  Procedure Laterality Date  . Tubal ligation     No family history on file. History  Substance Use Topics  . Smoking status: Former Smoker -- 0.50 packs/day  . Smokeless tobacco: Not on file  . Alcohol Use: Yes     Comment: occasional   OB History   Grav Para Term Preterm Abortions TAB SAB Ect Mult Living                 Review of Systems  Skin: Positive for wound (laceration to left ring finger).  Neurological: Positive for headaches.  All other systems reviewed and are negative.     Allergies  Aspirin  Home Medications   Prior to Admission medications   Medication Sig Start Date End Date Taking? Authorizing Provider  aspirin-acetaminophen-caffeine (EXCEDRIN MIGRAINE) [phone] MG per tablet Take 2 tablets by mouth every 6 (six) hours  as needed for headache.    Historical Provider, MD  diphenhydrAMINE (BENADRYL) 25 mg capsule Take 50 mg by mouth every 6 (six) hours as needed for allergies.    Historical Provider, MD  ibuprofen (ADVIL,MOTRIN) 200 MG tablet Take 600 mg by mouth every 6 (six) hours as needed for mild pain.    Historical Provider, MD  phenytoin (DILANTIN) 100 MG ER capsule Take 300 mg by mouth at bedtime.    Historical Provider, MD  promethazine (PHENERGAN) 25 MG tablet Take 1 tablet (25 mg total) by mouth every 6 (six) hours as needed for nausea or vomiting. 04/02/14   Tiffany Marilu Favre, PA-C  traMADol (ULTRAM) 50 MG tablet Take 1 tablet (50 mg total) by mouth every 6 (six) hours as needed. 04/02/14   Linus Mako, PA-C   Triage Vitals: BP 133/90  Pulse 71  Temp(Src) 98.8 F (37.1 C) (Oral)  Resp 18  Ht 6\' 1"  (1.854 m)  Wt 255 lb (115.667 kg)  BMI 33.65 kg/m2  SpO2 100%  LMP 05/06/2014  Physical Exam  Nursing note and vitals reviewed. Constitutional: She is oriented to person, place, and time. She appears well-developed and well-nourished. No distress.  HENT:  Head: Normocephalic and atraumatic.  Right Ear: External ear normal.  Left Ear: External ear normal.  Nose: Nose normal.  Mouth/Throat: Oropharynx is clear and moist.  Eyes: Conjunctivae  are normal.  Neck: Normal range of motion. Neck supple.  Cardiovascular: Normal rate.   Pulmonary/Chest: Effort normal.  Abdominal: Soft.  Musculoskeletal: Normal range of motion.  No obvious deformity of 4th finger of left hand  Neurological: She is alert and oriented to person, place, and time.  Sensation intact distal to injury  Skin: Skin is warm and dry. She is not diaphoretic.  1.5 semicircular laceration over the DIP of the 4th finger on the left hand on the dorsal side. No active bleeding  Psychiatric: She has a normal mood and affect.    ED Course  Procedures (including critical care time)  DIAGNOSTIC STUDIES: Oxygen Saturation is 100% on  RA, normal by my interpretation.    COORDINATION OF CARE: 9:46 AM- Will order Tylenol, T-dap booster injection Dermabond wound care. Pt advised of plan for treatment and pt agrees.  LACERATION REPAIR Performed by: Alvina Chou Authorized by: Alvina Chou Consent: Verbal consent obtained. Risks and benefits: risks, benefits and alternatives were discussed Consent given by: patient Patient identity confirmed: provided demographic data Prepped and Draped in normal sterile fashion Wound explored  Laceration Location: dorsal left fourth finger  Laceration Length: 1.5 cm  No Foreign Bodies seen or palpated  Anesthesia: none  Irrigation method: syringe Amount of cleaning: standard  Skin closure: dermabond  Number of sutures: n/a  Technique: n/a  Patient tolerance: Patient tolerated the procedure well with no immediate complications.     Labs Review Labs Reviewed - No data to display  Imaging Review No results found.   EKG Interpretation None      MDM   Final diagnoses:  Laceration of finger of left hand    10:08 AM Patient's laceration repaired without difficulty. No neurovascular compromise. Patient given tdap. No other injuries.   I personally performed the services described in this documentation, which was scribed in my presence. The recorded information has been reviewed and is accurate.     Alvina Chou, PA-C 05/08/14 1009

## 2014-05-08 NOTE — ED Notes (Signed)
Patient states was cutting some meat at work at 0100 today and cut her L 4th finger.   Patient states she has been unable to get the finger to stop bleeding.

## 2014-05-08 NOTE — Discharge Instructions (Signed)
Keep wound area clean. Do not pick or scrub the dermabond. The glue will dissolve as the wound heals. Refer to attached documents for more information.

## 2014-05-14 NOTE — ED Provider Notes (Signed)
Medical screening examination/treatment/procedure(s) were performed by non-physician practitioner and as supervising physician I was immediately available for consultation/collaboration.   EKG Interpretation None        Tanna Furry, MD 05/14/14 1544

## 2014-06-24 ENCOUNTER — Emergency Department (HOSPITAL_COMMUNITY)
Admission: EM | Admit: 2014-06-24 | Discharge: 2014-06-24 | Disposition: A | Discharge status: Home / Self Care | Payer: No Typology Code available for payment source | Attending: Emergency Medicine | Admitting: Emergency Medicine

## 2014-06-24 ENCOUNTER — Encounter (HOSPITAL_COMMUNITY): Payer: Self-pay | Admitting: Emergency Medicine

## 2014-06-24 DIAGNOSIS — G40909 Epilepsy, unspecified, not intractable, without status epilepticus: Secondary | ICD-10-CM | POA: Insufficient documentation

## 2014-06-24 DIAGNOSIS — W230XXA Caught, crushed, jammed, or pinched between moving objects, initial encounter: Secondary | ICD-10-CM | POA: Insufficient documentation

## 2014-06-24 DIAGNOSIS — S8990XA Unspecified injury of unspecified lower leg, initial encounter: Secondary | ICD-10-CM | POA: Insufficient documentation

## 2014-06-24 DIAGNOSIS — S99929A Unspecified injury of unspecified foot, initial encounter: Principal | ICD-10-CM

## 2014-06-24 DIAGNOSIS — K0889 Other specified disorders of teeth and supporting structures: Secondary | ICD-10-CM | POA: Insufficient documentation

## 2014-06-24 DIAGNOSIS — M25561 Pain in right knee: Secondary | ICD-10-CM

## 2014-06-24 DIAGNOSIS — Y9389 Activity, other specified: Secondary | ICD-10-CM | POA: Insufficient documentation

## 2014-06-24 DIAGNOSIS — S99919A Unspecified injury of unspecified ankle, initial encounter: Secondary | ICD-10-CM | POA: Insufficient documentation

## 2014-06-24 DIAGNOSIS — Z87891 Personal history of nicotine dependence: Secondary | ICD-10-CM | POA: Insufficient documentation

## 2014-06-24 DIAGNOSIS — Y9289 Other specified places as the place of occurrence of the external cause: Secondary | ICD-10-CM | POA: Insufficient documentation

## 2014-06-24 MED ORDER — TRAMADOL HCL 50 MG PO TABS: 50.0000 mg | ORAL_TABLET | Freq: Four times a day (QID) | ORAL | Status: DC | PRN

## 2014-06-24 MED ORDER — PENICILLIN V POTASSIUM 500 MG PO TABS: 500.0000 mg | ORAL_TABLET | Freq: Four times a day (QID) | ORAL | Status: AC

## 2014-06-24 NOTE — ED Notes (Signed)
Pt. Stated, Im having dental problems on the left side.  Im not sure which tooth is bothering me. I also hit my rt knee on the refrigerator and it hurts

## 2014-06-24 NOTE — ED Provider Notes (Signed)
CSN: [id number]     Arrival date & time 06/24/14  1250 History  This chart was scribed for a non-physician practitioner, Hyman Bible, PA-C, working with Arbie Cookey, * by Cathie Hoops, ED Scribe. The patient was seen in TR05C/TR05C. The patient's care was started at 3:15 PM.   Chief Complaint  Patient presents with  . Dental Pain  . Knee Pain   The history is provided by the patient. No language interpreter was used.   HPI Comments: Victoria Moore is a 38 y.o. female who presents to the Emergency Department complaining of constant left lower and upper dental pain that has been ongoing for the past month but worsened 2 weeks ago.. The pt states she has seen in another ED for the same dental pain and was prescribed Vicodin 5-325 MG. Patient states she has been taking this medication with relief but has recently run out. The patient denies having a regular dentist. Pt is not taking any antibiotics currently. Pt denies fever, chills, pain or stiffness of the neck, difficulty swallowing.   Pt also complains of a right knee injury that occurred this morning. Patient states she was walking when she struck her right knee on a refrigerator. She complains of sudden onset, constant right knee pain that is worse with bearing weight and ambulation. Pt denies numbness and tingling of the lower extremities. Denies any swelling or bruising of the knee.    Past Medical History  Diagnosis Date  . Seizures    Past Surgical History  Procedure Laterality Date  . Tubal ligation     No family history on file. History  Substance Use Topics  . Smoking status: Former Smoker -- 0.50 packs/day  . Smokeless tobacco: Not on file  . Alcohol Use: No     Comment: occasional   OB History   Grav Para Term Preterm Abortions TAB SAB Ect Mult Living                 Review of Systems  Constitutional: Negative for fever and chills.  HENT: Positive for dental problem.   Musculoskeletal: Positive for  arthralgias (right knee pain). Negative for neck pain and neck stiffness.  Neurological: Negative for weakness and numbness.  All other systems reviewed and are negative.     Allergies  Aspirin  Home Medications   Prior to Admission medications   Medication Sig Start Date End Date Taking? Authorizing Provider  HYDROcodone-acetaminophen (NORCO/VICODIN) 5-325 MG per tablet Take 2 tablets by mouth every 6 (six) hours as needed for moderate pain.   Yes Historical Provider, MD  phenytoin (DILANTIN) 100 MG ER capsule Take 300 mg by mouth at bedtime.   Yes Historical Provider, MD   Triage Vitals: BP 125/76  Pulse 78  Temp(Src) 98.4 F (36.9 C) (Oral)  Resp 18  Wt 245 lb (111.131 kg)  SpO2 100%  Physical Exam  Nursing note and vitals reviewed. Constitutional: She is oriented to person, place, and time. She appears well-developed and well-nourished. No distress.  HENT:  Head: Normocephalic and atraumatic.  Mouth/Throat: No trismus in the jaw.  No obvious dental abscess. No sublingual tenderness or swelling. No submental, submandiular swelling.   Eyes: Conjunctivae and EOM are normal.  Neck: Normal range of motion. Neck supple. No tracheal deviation present.  Cardiovascular: Normal rate, regular rhythm and normal heart sounds.   Pulses:      Dorsalis pedis pulses are 2+ on the right side, and 2+ on the left side.  Pulmonary/Chest: Effort normal and breath sounds normal. No respiratory distress.  Musculoskeletal: Normal range of motion.       Right knee: She exhibits normal range of motion. Tenderness found.  Tenderness to palpation over the right knee. No ecchymosis, swelling, erythema, or warmth. Full ROM of right knee. Distal sensation of the right foot is intact.   Neurological: She is alert and oriented to person, place, and time.  Skin: Skin is warm and dry.  Psychiatric: She has a normal mood and affect. Her behavior is normal.    ED Course  Procedures (including critical  care time) DIAGNOSTIC STUDIES: Oxygen Saturation is 100% on room air, normal by my interpretation.    COORDINATION OF CARE: 3:19 PM- Will prescribe Tramadol 50 MG and Veetid 500 MG. Patient informed of current plan for treatment and evaluation and agrees with plan at this time.  Labs Review Labs Reviewed - No data to display  Imaging Review No results found.   EKG Interpretation None      MDM   Final diagnoses:  None   Patient with toothache.  No gross abscess.  Exam unconcerning for Ludwig's angina or spread of infection.  Will treat with penicillin and pain medicine.  Urged patient to follow-up with dentist.    Patient presenting with knee pain.   No signs of infection.  Patient with full ROM of the knee.  No bruising or swelling on exam.  Patient ambulatory.  Do not feel that xrays are indicated at this time.  Feel that the patient is stable for discharge.  Return precautions given.        Hyman Bible, PA-C 06/24/14 1709

## 2014-06-24 NOTE — Discharge Instructions (Signed)
°Emergency Department Resource Guide °1) Find a Doctor and Pay Out of Pocket °Although you won't have to find out who is covered by your insurance plan, it is a good idea to ask around and get recommendations. You will then need to call the office and see if the doctor you have chosen will accept you as a new patient and what types of options they offer for patients who are self-pay. Some doctors offer discounts or will set up payment plans for their patients who do not have insurance, but you will need to ask so you aren't surprised when you get to your appointment. ° °2) Contact Your Local Health Department °Not all health departments have doctors that can see patients for sick visits, but many do, so it is worth a call to see if yours does. If you don't know where your local health department is, you can check in your phone book. The CDC also has a tool to help you locate your state's health department, and many state websites also have listings of all of their local health departments. ° °3) Find a Walk-in Clinic °If your illness is not likely to be very severe or complicated, you may want to try a walk in clinic. These are popping up all over the country in pharmacies, drugstores, and shopping centers. They're usually staffed by nurse practitioners or physician assistants that have been trained to treat common illnesses and complaints. They're usually fairly quick and inexpensive. However, if you have serious medical issues or chronic medical problems, these are probably not your best option. ° °No Primary Care Doctor: °- Call [facility] at  832-8000 - they can help you locate a primary care doctor that  accepts your insurance, provides certain services, etc. °- Physician Referral Service- 1-800-533-3463 ° °Chronic Pain Problems: °Organization         Address  Phone   Notes  °[facility]  (336) 297-2271 Patients need to be referred by their primary care doctor.  ° °Medication  Assistance: °Organization         Address  Phone   Notes  °[place] [facility] [address] °[place], [place] [address] (336) 641-8030 --Must be a resident of [place] °-- Must have NO insurance coverage whatsoever (no Medicaid/ Medicare, etc.) °-- The pt. MUST have a primary care doctor that directs their care regularly and follows them in the community °  °[facility]  (866) 331-1348   °[employer]  (888) 892-1162   ° °Agencies that provide inexpensive medical care: °Organization         Address  Phone   Notes  °[facility]  (336) 832-8035   °[facility]    (336) 832-7272   °[facility] [address] °[place], [place] [address] (336) 832-4777   °[facility] [address], °[place] (336) 271-4999   °[facility]    (336) 373-0678   °[facility]    (336) 272-1050   °[facility] ° [address], [place] Phone:  (336) 832-4444, Fax:  (336) 832-4440 Hours of Operation:  9 am - 6 pm, M-F.  Also accepts Medicaid/Medicare and self-pay.  °[facility] ° [address], [place] Phone: (336) 832-3150, Fax: (336) 832-3151. Hours of Operation:  8:30 am - 5:30 pm, M-F.  Also accepts Medicaid and self-pay.  °[facility] [place] [address], [place] Phone: (336) 878-6027   °[facility] [address], [place], [place] (336)723-1848, Ext. 123 Mondays & Thursdays: 7-9 AM.  First 15 patients are seen on a first come, first serve basis. °  ° °Medicaid-accepting [place] Providers: ° °Organization         Address  Phone   Notes  °[facility] [address], [place] (336) 641-2100 Also accepts self-pay patients.  °[facility] [address], [place] ° (336) 856-9996   °[facility] [address], [place]  (336) 288-8857   °[facility] [address] [place] [address], [place] (336) 299-7000   °Veita Bland [address], [place]  ° (336) 373-1557 Only accepts [place] Access Medicaid patients after they have their name applied to their card.  ° °Self-Pay (no insurance) in [place]: ° °Organization         Address  Phone   Notes  °Sickle Cell Patients, [facility] [address], [place] (336) 832-1970   °[facility] [address], [place] (336) 832-4400   ° [facility] ° [address], [place] (336) 992-4800   °[facility]/Dr. Osei-Bonsu ° [address] [place] [address], [place] or [address], [place] (336) 841-8500 Phone number for both [place] and [place] locations is the same.  °[facility] [address], [place] (336) 299-0000   °[facility] [address] [place] [address], [place] or [address] (336) 852-7530 °(336) 878-2260   °[facility] [address], [place] (336) 350-1642, phone; (336) 294-5005, fax Sees patients 1st and 3rd Saturday of every month.  Must not qualify for public or private insurance (i.e. Medicaid, Medicare, [place] Health Choice, Veterans' Benefits) • Household income should be no more than 200% of the poverty level •The clinic cannot treat you if you are pregnant or think you are pregnant • Sexually transmitted diseases are not treated at the clinic.  ° ° °Dental Care: °Organization         Address  Phone  Notes  °[facility] [facility] [address],  (336) 641-6152 Accepts children up to age 21 who are enrolled in Medicaid or [place] Health Choice; pregnant women with a Medicaid card; and children who have applied for Medicaid or [place] Health Choice, but were declined, whose parents can pay a reduced fee at time of service.  °[place]  [facility] [place]  [address], [place] (336) 641-7733 Accepts children up to age 21 who are enrolled in Medicaid or [place] Health Choice; pregnant women with a Medicaid card; and children who have applied for Medicaid or [place] Health Choice, but were declined, whose parents can pay a reduced fee at time of service.  °[facility] ° [address],  (336) 641-4533 Patients are seen by appointment only. Walk-ins are not accepted. [facility] will see patients 18 years of age and older. °Monday - Tuesday (8am-5pm) °Most Wednesdays (8:30-5pm) °$30 per visit, cash only  °[facility] ° [address], [place] (336) 641-4533 Patients are seen by appointment only. Walk-ins are not accepted. [facility] will see patients 18 years of age and older. °One   Wednesday Evening (Monthly: Volunteer Based).  $30 per visit, cash only  °[facility]  (919) 537-3737 for adults; Children under age 4, call Graduate Pediatric Dentistry at (919) [phone]. Children aged 4-14, please call (919) [phone] to request a pediatric application. ° Dental services are provided in all areas of dental care including fillings, crowns and bridges, complete and partial dentures, implants, gum treatment, root canals, and extractions. Preventive care is also provided. Treatment is provided to both adults and children. °Patients are selected via a lottery and there is often a waiting list. °  °[facility] [address], °[place] ° (336) 763-8833 [email or web address] °  °[facility] [address], [place], [place] (336)723-1848, Ext. 123 Second and Fourth Thursday of each month, opens at 6:30 AM; Clinic ends at 9 AM.  Patients are seen on a first-come first-served basis, and a limited number are seen during each clinic.  ° °[facility] ° [address], [place], [place] (336) 723-7904    Eligibility Requirements °You must have lived in [place], [place], or [place] counties for at least the last three months. °  You cannot be eligible for state or federal sponsored healthcare insurance, including Veterans Administration, Medicaid, or Medicare. °  You generally cannot be eligible for healthcare insurance through your employer.  °  How to apply: °Eligibility screenings are held every Tuesday and Wednesday afternoon from 1:00 pm until 4:00 pm. You do not need an appointment for the interview!  °[facility] [address], [place], [place] 336-631-2330   °[facility]  336-342-8273   °[facility]  336-703-3100   °[facility]  336-570-6415   ° °Behavioral Health Resources in the Community: °Intensive Outpatient Programs °Organization         Address  Phone  Notes  °[facility] [address], [place], [place] 336-878-6098   °[facility] [address], [place], [place] 336-832-9800   °[facility] [address], [place], [place] ° 336-882-2125   °[facility] [address],  °[place], [place] 1-800-853-5163 or 336-641-4981   °Substance Abuse Resources °Organization         Address  Phone  Notes  °[facility]  336-882-2125   °[facility]  336-784-9470   °[facility]  336-285-9073   °[facility]  336-845-3988   °Residential & Outpatient Substance Abuse Program  1-800-659-3381   °Psychological Services °Organization         Address  Phone  Notes  °[facility]  336- 832-9600   °[facility]  336- 378-7881   °[facility] [address], [place] 1-800-853-5163 or 336-641-4981   ° °Mobile Crisis Teams °Organization         Address  Phone  Notes  °[facility], Mobile Crisis Care Unit  1-877-626-1772   °Assertive °[facility] ° [address]  [place], [place] 336-834-9664   °Sharon DeEsch [address] °[place] 336-554-5454   ° °Self-Help/Support Groups °Organization         Address  Phone             Notes  °[facility] - variety of support groups  336- 373-1402 Call for more information  °Narcotics Anonymous (NA), [facility] [address], °[place]   2 meetings at this location  ° °  Residential Treatment Programs °Organization         Address  Phone  Notes  °[facility] [address],    °[place]  1-866-801-8205   °[facility] ° [address], [place], [place] 704-293-8524   °[facility] [address], [place] 336-845-3988 Admissions: 8am-3pm M-F  °[facility] [address]    °[place], [place] 336-841-1104   °[facility] [address], [place], [place] 336-379-7146   °[facility] [address]  °[place], [place] 336-285-9073   °[facility] - Intensive Outpatient [address], [place], [place] 336-852-3033   °[facility] [address]  °[place], [place] 1-877-615-2722 or 336-784-9470   °[facility] [address] [place], [place] 336-227-7417 Accepts Medicaid  °[facility] [address]  °[place] 1-800-659-3381 Substance Abuse/Addiction Treatment  ° °[place] Behavioral Health Resources °Organization         Address  Phone  Notes  °[facility]  (888) 581-9988   °Julie Brannon, PhD [address] [place], [place]   (336) 349-5553 or (336) 951-0000   °[facility]   [address] °[place], [place] (336) 349-4454   °[facility] [address], [place], [place] (336) 342-8316 Insurance/Medicaid/sponsorship through [facility]  °[facility] [address]                                    [place], [place] (336) 342-8316 Therapy/tele-psych/case    °[facility] [address]  ° [place], [place] (336) 349-2233    °Dr. Arfeen  (336) 349-4544   °[facility] [place]  [employer] [place] [facility] 1) [address], [place] °2) [address], [place] °3)  [address], [place] (336) 349-3220 °(336) 342-7768 ° °(336) 342-8140   °[place] [facility] (336) 342-1394 or (336) 342-3537 (After Hours)    ° ° °

## 2014-06-26 NOTE — ED Provider Notes (Signed)
Medical screening examination/treatment/procedure(s) were performed by non-physician practitioner and as supervising physician I was immediately available for consultation/collaboration.   Houston Siren III, MD 06/26/14 1025

## 2014-10-11 ENCOUNTER — Emergency Department (HOSPITAL_COMMUNITY)
Admission: EM | Admit: 2014-10-11 | Discharge: 2014-10-12 | Disposition: A | Discharge status: Home / Self Care | Payer: No Typology Code available for payment source | Attending: Emergency Medicine | Admitting: Emergency Medicine

## 2014-10-11 ENCOUNTER — Encounter (HOSPITAL_COMMUNITY): Payer: Self-pay | Admitting: Emergency Medicine

## 2014-10-11 DIAGNOSIS — Z87891 Personal history of nicotine dependence: Secondary | ICD-10-CM | POA: Insufficient documentation

## 2014-10-11 DIAGNOSIS — Y939 Activity, unspecified: Secondary | ICD-10-CM | POA: Diagnosis not present

## 2014-10-11 DIAGNOSIS — W57XXXA Bitten or stung by nonvenomous insect and other nonvenomous arthropods, initial encounter: Secondary | ICD-10-CM | POA: Insufficient documentation

## 2014-10-11 DIAGNOSIS — Y929 Unspecified place or not applicable: Secondary | ICD-10-CM | POA: Insufficient documentation

## 2014-10-11 DIAGNOSIS — S30861A Insect bite (nonvenomous) of abdominal wall, initial encounter: Secondary | ICD-10-CM | POA: Insufficient documentation

## 2014-10-11 DIAGNOSIS — G40909 Epilepsy, unspecified, not intractable, without status epilepticus: Secondary | ICD-10-CM | POA: Insufficient documentation

## 2014-10-11 DIAGNOSIS — R5383 Other fatigue: Secondary | ICD-10-CM | POA: Diagnosis not present

## 2014-10-11 DIAGNOSIS — R05 Cough: Secondary | ICD-10-CM | POA: Insufficient documentation

## 2014-10-11 DIAGNOSIS — R21 Rash and other nonspecific skin eruption: Secondary | ICD-10-CM | POA: Diagnosis not present

## 2014-10-11 DIAGNOSIS — R059 Cough, unspecified: Secondary | ICD-10-CM

## 2014-10-11 NOTE — ED Notes (Signed)
Pt. reports insect bite " itchy" at left lower abdomen this Wednesday , denies drainage / no fever or chills.

## 2014-10-12 MED ORDER — DOXYCYCLINE HYCLATE 100 MG PO CAPS: 100.0000 mg | ORAL_CAPSULE | Freq: Two times a day (BID) | ORAL | Status: DC

## 2014-10-12 MED ORDER — DEXTROMETHORPHAN POLISTIREX 30 MG/5ML PO LQCR: 15.0000 mg | Freq: Two times a day (BID) | ORAL | Status: DC

## 2014-10-12 NOTE — ED Provider Notes (Signed)
Medical screening examination/treatment/procedure(s) were performed by non-physician practitioner and as supervising physician I was immediately available for consultation/collaboration.   EKG Interpretation None        Julianne Rice, MD 10/12/14 [phone]

## 2014-10-12 NOTE — ED Notes (Signed)
Pt c/o sm itchy  area on left lower abd x's 2 days.

## 2014-10-12 NOTE — ED Provider Notes (Signed)
CSN: [id number]     Arrival date & time 10/11/14  2317 History   First MD Initiated Contact with Patient 10/11/14 2338     Chief Complaint  Patient presents with  . Insect Bite     (Consider location/radiation/quality/duration/timing/severity/associated sxs/prior Treatment) HPI Comments: Patient presents emergency department with chief complaint of insect bite. She states that she noticed a small itchy bump on the left side of her lower abdomen on Wednesday. She denies any associated pain, erythema, or fever. There no aggravating or alleviating factors. She states that she has felt a little under the weather lately, and has felt more fatigued than usual. She denies any chest pain, shortness of breath, cough, or abdominal pain. She has not tried taking anything to alleviate the symptoms.  The history is provided by the patient. No language interpreter was used.    Past Medical History  Diagnosis Date  . Seizures    Past Surgical History  Procedure Laterality Date  . Tubal ligation     No family history on file. History  Substance Use Topics  . Smoking status: Former Smoker -- 0.50 packs/day  . Smokeless tobacco: Not on file  . Alcohol Use: No     Comment: occasional   OB History   Grav Para Term Preterm Abortions TAB SAB Ect Mult Living                 Review of Systems  Constitutional: Negative for fever and chills.  Respiratory: Negative for shortness of breath.   Cardiovascular: Negative for chest pain.  Gastrointestinal: Negative for nausea, vomiting, diarrhea and constipation.  Genitourinary: Negative for dysuria.  Skin: Positive for rash.  All other systems reviewed and are negative.     Allergies  Aspirin  Home Medications   Prior to Admission medications   Medication Sig Start Date End Date Taking? Authorizing Provider  dextromethorphan (DELSYM) 30 MG/5ML liquid Take 2.5 mLs (15 mg total) by mouth 2 (two) times daily. 10/11/14   Montine Circle, PA-C   doxycycline (VIBRAMYCIN) 100 MG capsule Take 1 capsule (100 mg total) by mouth 2 (two) times daily. 10/11/14   Montine Circle, PA-C  HYDROcodone-acetaminophen (NORCO/VICODIN) 5-325 MG per tablet Take 2 tablets by mouth every 6 (six) hours as needed for moderate pain.    Historical Provider, MD  phenytoin (DILANTIN) 100 MG ER capsule Take 300 mg by mouth at bedtime.    Historical Provider, MD  traMADol (ULTRAM) 50 MG tablet Take 1 tablet (50 mg total) by mouth every 6 (six) hours as needed. 06/24/14   Heather Laisure, PA-C   BP 134/89  Pulse 74  Temp(Src) 98.2 F (36.8 C) (Oral)  Resp 14  SpO2 100%  LMP 10/04/2014 Physical Exam  Nursing note and vitals reviewed. Constitutional: She is oriented to person, place, and time. She appears well-developed and well-nourished.  HENT:  Head: Normocephalic and atraumatic.  Eyes: Conjunctivae and EOM are normal. Pupils are equal, round, and reactive to light.  Neck: Normal range of motion. Neck supple.  Cardiovascular: Normal rate and regular rhythm.  Exam reveals no gallop and no friction rub.   No murmur heard. Pulmonary/Chest: Effort normal and breath sounds normal. No respiratory distress. She has no wheezes. She has no rales. She exhibits no tenderness.  Abdominal: Soft. Bowel sounds are normal. She exhibits no distension and no mass. There is no tenderness. There is no rebound and no guarding.  Musculoskeletal: Normal range of motion. She exhibits no edema and no tenderness.  Neurological: She is alert and oriented to person, place, and time.  Skin: Skin is warm and dry.  Small insect bites the left lower abdomen, no erythema, no induration, no evidence of abscess  Psychiatric: She has a normal mood and affect. Her behavior is normal. Judgment and thought content normal.    ED Course  Procedures (including critical care time) Labs Review Labs Reviewed - No data to display  Imaging Review No results found.   EKG Interpretation None       MDM   Final diagnoses:  Insect bite  Cough    Patient with insect bites and cough. Will treat with doxycycline and give the patient an inhaler. She is afebrile. She is nontoxic-appearing. She is not in any apparent distress. Will discharge to home with primary care followup. Patient understands and agrees with plan. She is stable and ready for discharge.    Montine Circle, PA-C 10/12/14 [phone]

## 2014-10-12 NOTE — Discharge Instructions (Signed)
Cough, Adult  A cough is a reflex that helps clear your throat and airways. It can help heal the body or may be a reaction to an irritated airway. A cough may only last 2 or 3 weeks (acute) or may last more than 8 weeks (chronic).  CAUSES Acute cough:  Viral or bacterial infections. Chronic cough:  Infections.  Allergies.  Asthma.  Post-nasal drip.  Smoking.  Heartburn or acid reflux.  Some medicines.  Chronic lung problems (COPD).  Cancer. SYMPTOMS   Cough.  Fever.  Chest pain.  Increased breathing rate.  High-pitched whistling sound when breathing (wheezing).  Colored mucus that you cough up (sputum). TREATMENT   A bacterial cough may be treated with antibiotic medicine.  A viral cough must run its course and will not respond to antibiotics.  Your caregiver may recommend other treatments if you have a chronic cough. HOME CARE INSTRUCTIONS   Only take over-the-counter or prescription medicines for pain, discomfort, or fever as directed by your caregiver. Use cough suppressants only as directed by your caregiver.  Use a cold steam vaporizer or humidifier in your bedroom or home to help loosen secretions.  Sleep in a semi-upright position if your cough is worse at night.  Rest as needed.  Stop smoking if you smoke. SEEK IMMEDIATE MEDICAL CARE IF:   You have pus in your sputum.  Your cough starts to worsen.  You cannot control your cough with suppressants and are losing sleep.  You begin coughing up blood.  You have difficulty breathing.  You develop pain which is getting worse or is uncontrolled with medicine.  You have a fever. MAKE SURE YOU:   Understand these instructions.  Will watch your condition.  Will get help right away if you are not doing well or get worse. Document Released: 06/11/2011 Document Revised: 03/06/2012 Document Reviewed: 06/11/2011 [facility] Patient Information 2015 [place], [place]. This information is not intended  to replace advice given to you by your health care provider. Make sure you discuss any questions you have with your health care provider. Insect Bite Mosquitoes, flies, fleas, bedbugs, and many other insects can bite. Insect bites are different from insect stings. A sting is when venom is injected into the skin. Some insect bites can transmit infectious diseases. SYMPTOMS  Insect bites usually turn red, swell, and itch for 2 to 4 days. They often go away on their own. TREATMENT  Your caregiver may prescribe antibiotic medicines if a bacterial infection develops in the bite. HOME CARE INSTRUCTIONS  Do not scratch the bite area.  Keep the bite area clean and dry. Wash the bite area thoroughly with soap and water.  Put ice or cool compresses on the bite area.  Put ice in a plastic bag.  Place a towel between your skin and the bag.  Leave the ice on for 20 minutes, 4 times a day for the first 2 to 3 days, or as directed.  You may apply a baking soda paste, cortisone cream, or calamine lotion to the bite area as directed by your caregiver. This can help reduce itching and swelling.  Only take over-the-counter or prescription medicines as directed by your caregiver.  If you are given antibiotics, take them as directed. Finish them even if you start to feel better. You may need a tetanus shot if:  You cannot remember when you had your last tetanus shot.  You have never had a tetanus shot.  The injury broke your skin. If you get  a tetanus shot, your arm may swell, get red, and feel warm to the touch. This is common and not a problem. If you need a tetanus shot and you choose not to have one, there is a rare chance of getting tetanus. Sickness from tetanus can be serious. SEEK IMMEDIATE MEDICAL CARE IF:   You have increased pain, redness, or swelling in the bite area.  You see a red line on the skin coming from the bite.  You have a fever.  You have joint pain.  You have a headache  or neck pain.  You have unusual weakness.  You have a rash.  You have chest pain or shortness of breath.  You have abdominal pain, nausea, or vomiting.  You feel unusually tired or sleepy. MAKE SURE YOU:   Understand these instructions.  Will watch your condition.  Will get help right away if you are not doing well or get worse. Document Released: 01/20/2005 Document Revised: 03/06/2012 Document Reviewed: 07/14/2011 [facility] Patient Information 2015 [place], [place]. This information is not intended to replace advice given to you by your health care provider. Make sure you discuss any questions you have with your health care provider.

## 2014-11-11 ENCOUNTER — Encounter (HOSPITAL_COMMUNITY): Payer: Self-pay | Admitting: *Deleted

## 2014-11-11 DIAGNOSIS — N920 Excessive and frequent menstruation with regular cycle: Secondary | ICD-10-CM | POA: Diagnosis present

## 2014-11-11 DIAGNOSIS — R600 Localized edema: Secondary | ICD-10-CM | POA: Diagnosis present

## 2014-11-11 DIAGNOSIS — Z79891 Long term (current) use of opiate analgesic: Secondary | ICD-10-CM

## 2014-11-11 DIAGNOSIS — Z886 Allergy status to analgesic agent status: Secondary | ICD-10-CM

## 2014-11-11 DIAGNOSIS — D509 Iron deficiency anemia, unspecified: Principal | ICD-10-CM | POA: Diagnosis present

## 2014-11-11 DIAGNOSIS — G40909 Epilepsy, unspecified, not intractable, without status epilepticus: Secondary | ICD-10-CM | POA: Diagnosis present

## 2014-11-11 DIAGNOSIS — R0781 Pleurodynia: Secondary | ICD-10-CM | POA: Diagnosis not present

## 2014-11-11 DIAGNOSIS — Z79899 Other long term (current) drug therapy: Secondary | ICD-10-CM

## 2014-11-11 LAB — URINALYSIS, ROUTINE W REFLEX MICROSCOPIC
Bilirubin Urine: NEGATIVE
Glucose, UA: NEGATIVE mg/dL
Hgb urine dipstick: NEGATIVE
Ketones, ur: NEGATIVE mg/dL
Nitrite: NEGATIVE
Protein, ur: 30 mg/dL — AB
Specific Gravity, Urine: 1.033 — ABNORMAL HIGH (ref 1.005–1.030)
Urobilinogen, UA: 0.2 mg/dL (ref 0.0–1.0)
pH: 7.5 (ref 5.0–8.0)

## 2014-11-11 LAB — URINE MICROSCOPIC-ADD ON

## 2014-11-11 LAB — PREGNANCY, URINE: Preg Test, Ur: NEGATIVE

## 2014-11-11 NOTE — ED Notes (Signed)
Pt reports right side flank pain upon waking up this morning. Movement increases pain, lying on back decreases pain, some nausea, denies v/d, constipation, dysuria.

## 2014-11-12 ENCOUNTER — Inpatient Hospital Stay (HOSPITAL_COMMUNITY)
Admission: EM | Admit: 2014-11-12 | Discharge: 2014-11-13 | DRG: 812 | Disposition: A | Discharge status: Home / Self Care | Payer: No Typology Code available for payment source | Attending: Internal Medicine | Admitting: Internal Medicine

## 2014-11-12 ENCOUNTER — Encounter (HOSPITAL_COMMUNITY): Payer: Self-pay | Admitting: Radiology

## 2014-11-12 ENCOUNTER — Emergency Department (HOSPITAL_COMMUNITY): Payer: No Typology Code available for payment source

## 2014-11-12 ENCOUNTER — Inpatient Hospital Stay (HOSPITAL_COMMUNITY): Payer: No Typology Code available for payment source

## 2014-11-12 DIAGNOSIS — G40909 Epilepsy, unspecified, not intractable, without status epilepticus: Secondary | ICD-10-CM | POA: Diagnosis present

## 2014-11-12 DIAGNOSIS — Z79899 Other long term (current) drug therapy: Secondary | ICD-10-CM | POA: Diagnosis not present

## 2014-11-12 DIAGNOSIS — R0781 Pleurodynia: Secondary | ICD-10-CM | POA: Diagnosis present

## 2014-11-12 DIAGNOSIS — R600 Localized edema: Secondary | ICD-10-CM | POA: Diagnosis present

## 2014-11-12 DIAGNOSIS — R079 Chest pain, unspecified: Secondary | ICD-10-CM

## 2014-11-12 DIAGNOSIS — Z886 Allergy status to analgesic agent status: Secondary | ICD-10-CM | POA: Diagnosis not present

## 2014-11-12 DIAGNOSIS — D649 Anemia, unspecified: Secondary | ICD-10-CM | POA: Diagnosis present

## 2014-11-12 DIAGNOSIS — N920 Excessive and frequent menstruation with regular cycle: Secondary | ICD-10-CM | POA: Diagnosis present

## 2014-11-12 DIAGNOSIS — E611 Iron deficiency: Secondary | ICD-10-CM | POA: Diagnosis present

## 2014-11-12 DIAGNOSIS — D508 Other iron deficiency anemias: Secondary | ICD-10-CM

## 2014-11-12 DIAGNOSIS — D509 Iron deficiency anemia, unspecified: Secondary | ICD-10-CM | POA: Diagnosis present

## 2014-11-12 DIAGNOSIS — Z79891 Long term (current) use of opiate analgesic: Secondary | ICD-10-CM | POA: Diagnosis not present

## 2014-11-12 LAB — COMPREHENSIVE METABOLIC PANEL
ALT: 8 U/L (ref 0–35)
ALT: 17 U/L (ref 0–35)
AST: 13 U/L (ref 0–37)
AST: 25 U/L (ref 0–37)
Albumin: 3.7 g/dL (ref 3.5–5.2)
Albumin: 3.2 g/dL — ABNORMAL LOW (ref 3.5–5.2)
Alkaline Phosphatase: 43 U/L (ref 39–117)
Alkaline Phosphatase: 44 U/L (ref 39–117)
Anion gap: 13 (ref 5–15)
Anion gap: 12 (ref 5–15)
BUN: 13 mg/dL (ref 6–23)
BUN: 9 mg/dL (ref 6–23)
CO2: 23 mEq/L (ref 19–32)
CO2: 21 mEq/L (ref 19–32)
Calcium: 8.6 mg/dL (ref 8.4–10.5)
Calcium: 8.2 mg/dL — ABNORMAL LOW (ref 8.4–10.5)
Chloride: 105 mEq/L (ref 96–112)
Chloride: 106 mEq/L (ref 96–112)
Creatinine, Ser: 0.82 mg/dL (ref 0.50–1.10)
Creatinine, Ser: 0.81 mg/dL (ref 0.50–1.10)
GFR calc Af Amer: >90 mL/min (ref >90)
GFR calc Af Amer: >90 mL/min (ref >90)
GFR calc non Af Amer: 88 mL/min — ABNORMAL LOW (ref >90)
GFR calc non Af Amer: 90 mL/min — ABNORMAL LOW (ref >90)
Glucose, Bld: 95 mg/dL (ref 70–99)
Glucose, Bld: 139 mg/dL — ABNORMAL HIGH (ref 70–99)
Potassium: 4.1 mEq/L (ref 3.7–5.3)
Potassium: 4.1 mEq/L (ref 3.7–5.3)
Sodium: 141 mEq/L (ref 137–147)
Sodium: 139 mEq/L (ref 137–147)
Total Bilirubin: <0.2 mg/dL — ABNORMAL LOW (ref 0.3–1.2)
Total Bilirubin: 0.4 mg/dL (ref 0.3–1.2)
Total Protein: 7 g/dL (ref 6.0–8.3)
Total Protein: 6.2 g/dL (ref 6.0–8.3)

## 2014-11-12 LAB — CBC WITH DIFFERENTIAL/PLATELET
Basophils Absolute: 0.1 10*3/uL (ref 0.0–0.1)
Basophils Absolute: 0 10*3/uL (ref 0.0–0.1)
Basophils Relative: 1 % (ref 0–1)
Basophils Relative: 0 % (ref 0–1)
Eosinophils Absolute: 0.2 10*3/uL (ref 0.0–0.7)
Eosinophils Absolute: 0.1 10*3/uL (ref 0.0–0.7)
Eosinophils Relative: 3 % (ref 0–5)
Eosinophils Relative: 2 % (ref 0–5)
HCT: 24 % — ABNORMAL LOW (ref 36.0–46.0)
HCT: 28 % — ABNORMAL LOW (ref 36.0–46.0)
Hemoglobin: 6.8 g/dL — CL (ref 12.0–15.0)
Hemoglobin: 8.3 g/dL — ABNORMAL LOW (ref 12.0–15.0)
Lymphocytes Relative: 34 % (ref 12–46)
Lymphocytes Relative: 6 % — ABNORMAL LOW (ref 12–46)
Lymphs Abs: 1.7 10*3/uL (ref 0.7–4.0)
Lymphs Abs: 0.4 10*3/uL — ABNORMAL LOW (ref 0.7–4.0)
MCH: 19.2 pg — ABNORMAL LOW (ref 26.0–34.0)
MCH: 20.9 pg — ABNORMAL LOW (ref 26.0–34.0)
MCHC: 28.3 g/dL — ABNORMAL LOW (ref 30.0–36.0)
MCHC: 29.6 g/dL — ABNORMAL LOW (ref 30.0–36.0)
MCV: 67.8 fL — ABNORMAL LOW (ref 78.0–100.0)
MCV: 70.4 fL — ABNORMAL LOW (ref 78.0–100.0)
Monocytes Absolute: 0.5 10*3/uL (ref 0.1–1.0)
Monocytes Absolute: 0.1 10*3/uL (ref 0.1–1.0)
Monocytes Relative: 9 % (ref 3–12)
Monocytes Relative: 2 % — ABNORMAL LOW (ref 3–12)
Neutro Abs: 2.5 10*3/uL (ref 1.7–7.7)
Neutro Abs: 6.4 10*3/uL (ref 1.7–7.7)
Neutrophils Relative %: 53 % (ref 43–77)
Neutrophils Relative %: 90 % — ABNORMAL HIGH (ref 43–77)
Platelets: 198 10*3/uL (ref 150–400)
Platelets: 167 10*3/uL (ref 150–400)
RBC: 3.54 MIL/uL — ABNORMAL LOW (ref 3.87–5.11)
RBC: 3.98 MIL/uL (ref 3.87–5.11)
RDW: 17.2 % — ABNORMAL HIGH (ref 11.5–15.5)
RDW: 18.5 % — ABNORMAL HIGH (ref 11.5–15.5)
WBC: 5 10*3/uL (ref 4.0–10.5)
WBC: 7 10*3/uL (ref 4.0–10.5)

## 2014-11-12 LAB — FOLATE: Folate: 12.5 ng/mL

## 2014-11-12 LAB — POC OCCULT BLOOD, ED: Fecal Occult Bld: NEGATIVE

## 2014-11-12 LAB — VITAMIN B12: Vitamin B-12: 341 pg/mL (ref 211–911)

## 2014-11-12 LAB — RETICULOCYTES
RBC.: 3.68 MIL/uL — ABNORMAL LOW (ref 3.87–5.11)
Retic Count, Absolute: 33.1 10*3/uL (ref 19.0–186.0)
Retic Ct Pct: 0.9 % (ref 0.4–3.1)

## 2014-11-12 LAB — D-DIMER, QUANTITATIVE (NOT AT ARMC): D-Dimer, Quant: 0.6 ug/mL-FEU — ABNORMAL HIGH (ref 0.00–0.48)

## 2014-11-12 LAB — IRON AND TIBC
Iron: 10 ug/dL — ABNORMAL LOW (ref 42–135)
Saturation Ratios: 2 % — ABNORMAL LOW (ref 20–55)
TIBC: 446 ug/dL (ref 250–470)
UIBC: 436 ug/dL — ABNORMAL HIGH (ref 125–400)

## 2014-11-12 LAB — PROTIME-INR
INR: 1.11 (ref 0.00–1.49)
Prothrombin Time: 14.4 seconds (ref 11.6–15.2)

## 2014-11-12 LAB — TROPONIN I: Troponin I: <0.3 ng/mL (ref <0.30)

## 2014-11-12 LAB — FERRITIN: Ferritin: 3 ng/mL — ABNORMAL LOW (ref 10–291)

## 2014-11-12 LAB — LIPASE, BLOOD: Lipase: 36 U/L (ref 11–59)

## 2014-11-12 LAB — PREPARE RBC (CROSSMATCH)

## 2014-11-12 LAB — ABO/RH: ABO/RH(D): A POS

## 2014-11-12 MED ORDER — SODIUM CHLORIDE 0.9 % IV SOLN
10.0000 mL/h | Freq: Once | INTRAVENOUS | Status: AC
  Administered 2014-11-12: 10 mL/h via INTRAVENOUS

## 2014-11-12 MED ORDER — IOHEXOL 350 MG/ML SOLN
100.0000 mL | Freq: Once | INTRAVENOUS | Status: AC | PRN
  Administered 2014-11-12: 100 mL via INTRAVENOUS

## 2014-11-12 MED ORDER — MORPHINE SULFATE 4 MG/ML IJ SOLN
4.0000 mg | Freq: Once | INTRAMUSCULAR | Status: AC
  Administered 2014-11-12: 4 mg via INTRAVENOUS
  Filled 2014-11-12: qty 1

## 2014-11-12 MED ORDER — ONDANSETRON HCL 4 MG/2ML IJ SOLN: 4.0000 mg | Freq: Four times a day (QID) | INTRAMUSCULAR | Status: DC | PRN

## 2014-11-12 MED ORDER — ONDANSETRON HCL 4 MG PO TABS: 4.0000 mg | ORAL_TABLET | Freq: Four times a day (QID) | ORAL | Status: DC | PRN

## 2014-11-12 MED ORDER — SODIUM CHLORIDE 0.9 % IJ SOLN
3.0000 mL | Freq: Two times a day (BID) | INTRAMUSCULAR | Status: DC
  Administered 2014-11-12 – 2014-11-13 (×2): 3 mL via INTRAVENOUS

## 2014-11-12 MED ORDER — FERROUS SULFATE 325 (65 FE) MG PO TABS
325.0000 mg | ORAL_TABLET | Freq: Three times a day (TID) | ORAL | Status: DC
  Administered 2014-11-12 – 2014-11-13 (×6): 325 mg via ORAL
  Filled 2014-11-12 (×7): qty 1

## 2014-11-12 MED ORDER — MORPHINE SULFATE 2 MG/ML IJ SOLN
2.0000 mg | INTRAMUSCULAR | Status: DC | PRN
  Administered 2014-11-12 (×2): 2 mg via INTRAVENOUS
  Filled 2014-11-12 (×2): qty 1

## 2014-11-12 MED ORDER — ACETAMINOPHEN 325 MG PO TABS: 650.0000 mg | ORAL_TABLET | Freq: Four times a day (QID) | ORAL | Status: DC | PRN

## 2014-11-12 MED ORDER — ONDANSETRON HCL 4 MG/2ML IJ SOLN
4.0000 mg | Freq: Once | INTRAMUSCULAR | Status: AC
  Administered 2014-11-12: 4 mg via INTRAVENOUS
  Filled 2014-11-12: qty 2

## 2014-11-12 MED ORDER — OXYCODONE-ACETAMINOPHEN 5-325 MG PO TABS
1.0000 | ORAL_TABLET | ORAL | Status: DC | PRN
  Administered 2014-11-12 – 2014-11-13 (×4): 1 via ORAL
  Filled 2014-11-12 (×4): qty 1

## 2014-11-12 MED ORDER — ACETAMINOPHEN 650 MG RE SUPP: 650.0000 mg | Freq: Four times a day (QID) | RECTAL | Status: DC | PRN

## 2014-11-12 MED ORDER — VITAMIN B-12 1000 MCG PO TABS
1000.0000 ug | ORAL_TABLET | Freq: Every day | ORAL | Status: DC
  Administered 2014-11-12 – 2014-11-13 (×2): 1000 ug via ORAL
  Filled 2014-11-12 (×2): qty 1

## 2014-11-12 MED ORDER — HYDROCODONE-ACETAMINOPHEN 5-325 MG PO TABS
2.0000 | ORAL_TABLET | Freq: Four times a day (QID) | ORAL | Status: DC | PRN
  Filled 2014-11-12: qty 2

## 2014-11-12 MED ORDER — FOLIC ACID 1 MG PO TABS
1.0000 mg | ORAL_TABLET | Freq: Every day | ORAL | Status: DC
  Administered 2014-11-12 – 2014-11-13 (×2): 1 mg via ORAL
  Filled 2014-11-12 (×2): qty 1

## 2014-11-12 MED ORDER — PHENYTOIN SODIUM EXTENDED 100 MG PO CAPS
300.0000 mg | ORAL_CAPSULE | Freq: Every day | ORAL | Status: DC
  Administered 2014-11-12: 300 mg via ORAL
  Filled 2014-11-12 (×2): qty 3

## 2014-11-12 MED ORDER — METHOCARBAMOL 500 MG PO TABS
500.0000 mg | ORAL_TABLET | Freq: Three times a day (TID) | ORAL | Status: DC | PRN
  Administered 2014-11-12 – 2014-11-13 (×3): 500 mg via ORAL
  Filled 2014-11-12 (×5): qty 1

## 2014-11-12 MED ORDER — PNEUMOCOCCAL VAC POLYVALENT 25 MCG/0.5ML IJ INJ
0.5000 mL | INJECTION | INTRAMUSCULAR | Status: AC
  Administered 2014-11-13: 0.5 mL via INTRAMUSCULAR
  Filled 2014-11-12: qty 0.5

## 2014-11-12 MED ORDER — SODIUM CHLORIDE 0.9 % IV BOLUS (SEPSIS)
1000.0000 mL | Freq: Once | INTRAVENOUS | Status: AC
  Administered 2014-11-12: 1000 mL via INTRAVENOUS

## 2014-11-12 NOTE — Progress Notes (Signed)
Utilization review completed.  

## 2014-11-12 NOTE — ED Notes (Addendum)
CRITICAL VALUE ALERT  Critical value received:  Hg 6.8  Date of notification:  11/12/2014  Time of notification:  0134  Critical value read back: yes

## 2014-11-12 NOTE — H&P (Signed)
[facility] History and Physical  Patient: Victoria Moore  KXF:[id number]  DOB: 08-16-1976  DOS: the patient was seen and examined on 11/12/2014 PCP: No PCP Per Patient  Chief Complaint: right-sided chest pain  HPI: Victoria Moore is a 38 y.o. female with Past medical history of seizures. The patient is presenting with complaints of right-sided chest pain.she mentions that she was sleeping and when she woke up she started having this pain the pain is reproducible is worsening with movement as well as taking a deep breath. She denies any fever chills cough pain anywhere else leg swelling nausea vomiting acid reflux diarrhea burning urination. She denies any active bleeding or black color bowel movement. She complains of shortness of breath on exertion some ankle swelling. She mentions she has heavy periods which are regular monthly last 7 days and during those 7 days she goes to one pack of pads, which 10-15 on day one. She mentions she has been in the past diagnosed with fibroids  The patient is coming from home. And at her baseline independent for most of her ADL.  Review of Systems: as mentioned in the history of present illness.  A Comprehensive review of the other systems is negative.  Past Medical History  Diagnosis Date  . Seizures    Past Surgical History  Procedure Laterality Date  . Tubal ligation     Social History:  reports that she has quit smoking. She does not have any smokeless tobacco history on file. She reports that she does not drink alcohol or use illicit drugs.  Allergies  Allergen Reactions  . Aspirin Hives    History reviewed. No pertinent family history.  Prior to Admission medications   Medication Sig Start Date End Date Taking? Authorizing Provider  dextromethorphan (DELSYM) 30 MG/5ML liquid Take 2.5 mLs (15 mg total) by mouth 2 (two) times daily. Patient taking differently: Take 15 mg by mouth 2 (two) times daily as needed for cough.  10/11/14   Yes Montine Circle, PA-C  HYDROcodone-acetaminophen (NORCO/VICODIN) 5-325 MG per tablet Take 2 tablets by mouth every 6 (six) hours as needed for moderate pain.   Yes Historical Provider, MD  naproxen sodium (ANAPROX) 220 MG tablet Take 660 mg by mouth 2 (two) times daily as needed (pain).   Yes Historical Provider, MD  phenytoin (DILANTIN) 100 MG ER capsule Take 300 mg by mouth at bedtime.   Yes Historical Provider, MD  doxycycline (VIBRAMYCIN) 100 MG capsule Take 1 capsule (100 mg total) by mouth 2 (two) times daily. Patient not taking: Reported on 11/11/2014 10/11/14   Montine Circle, PA-C  traMADol (ULTRAM) 50 MG tablet Take 1 tablet (50 mg total) by mouth every 6 (six) hours as needed. Patient not taking: Reported on 11/11/2014 06/24/14   Hyman Bible, PA-C    Physical Exam: Filed Vitals:   11/12/14 0351 11/12/14 0415 11/12/14 0430 11/12/14 0435  BP:  139/79 139/81   Pulse:  60 68   Temp: 98.5 F (36.9 C) 98.5 F (36.9 C)  98.1 F (36.7 C)  TempSrc: Oral Oral  Oral  Resp:  14 15   SpO2:  100% 100%     General: Alert, Awake and Oriented to Time, Place and Person. Appear in mild distress Eyes: PERRL ENT: Oral Mucosa clear moist. Neck: no JVD Cardiovascular: S1 and S2 Present, no Murmur, Peripheral Pulses Present Respiratory: Bilateral Air entry equal and Decreased, Clear to Auscultation, noCrackles, no wheezes Abdomen: Bowel Sound present, Soft and non tender Skin: no  Rash Extremities: no Pedal edema, no calf tenderness Neurologic: Grossly no focal neuro deficit.  Labs on Admission:  CBC:  Recent Labs Lab 11/12/14 0050  WBC 5.0  NEUTROABS 2.5  HGB 6.8*  HCT 24.0*  MCV 67.8*  PLT 198    CMP     Component Value Date/Time   NA 141 11/12/2014 0050   K 4.1 11/12/2014 0050   CL 105 11/12/2014 0050   CO2 23 11/12/2014 0050   GLUCOSE 95 11/12/2014 0050   BUN 13 11/12/2014 0050   CREATININE 0.82 11/12/2014 0050   CALCIUM 8.6 11/12/2014 0050   PROT 7.0  11/12/2014 0050   ALBUMIN 3.7 11/12/2014 0050   AST 13 11/12/2014 0050   ALT 8 11/12/2014 0050   ALKPHOS 43 11/12/2014 0050   BILITOT <0.2* 11/12/2014 0050   GFRNONAA 88* 11/12/2014 0050   GFRAA >90 11/12/2014 0050     Recent Labs Lab 11/12/14 0050  LIPASE 36   No results for input(s): AMMONIA in the last 168 hours.  No results for input(s): CKTOTAL, CKMB, CKMBINDEX, TROPONINI in the last 168 hours. BNP (last 3 results) No results for input(s): PROBNP in the last 8760 hours.  Radiological Exams on Admission: Dg Chest 2 View  11/12/2014   CLINICAL DATA:  Right-sided axillary and rib pain, no known injury  EXAM: CHEST  2 VIEW  COMPARISON:  10/27/2012  FINDINGS: Cardiac shadow and as at the upper limits of normal in size. The lungs are clear bilaterally. No sizable infiltrate or effusion is seen. No acute bony abnormality is noted.  IMPRESSION: No acute abnormality noted.   Electronically Signed   By: Inez Catalina M.D.   On: 11/12/2014 01:35    EKG: Independently reviewed. normal EKG, normal sinus rhythm, there are no previous tracings available for comparison.  Assessment/Plan Principal Problem:   Symptomatic anemia Active Problems:   Menorrhagia   Iron deficiency   Right-sided chest pain   1. Symptomatic anemia The patient is presenting with complaints of pedal edema shortness of breath and chest pain. On further workup she is found to have hemoglobin of 6.8 with a very low MCV. Most likely etiology is chronic menstrual blood loss. She will be admitted in the hospital for blood transfusion. Further workup including O-06 folic acid iron is pending. I would start her on Y-04 folic acid and iron supplementation Recommended follow-up with OB/GYN as an outpatient for further workup of her menorrhagia  2.Right-sided chest pain Most likely muscle spasm EKG negative. Troponin  Advance goals of care discussion: Full code   DVT Prophylaxis: scd Nutrition: clear liquid  diet  Disposition: Admitted to inpatient in telemetry unit.  Author: Berle Mull, MD [facility] Pager: [phone] 11/12/2014, 6:20 AM    If 7PM-7AM, please contact night-coverage [email or web address] Password [email or web address]

## 2014-11-12 NOTE — Progress Notes (Signed)
Attempted to call for report.  RN busy. Left number to return call.

## 2014-11-12 NOTE — ED Provider Notes (Signed)
CSN: [id number]     Arrival date & time 11/11/14  2216 History   First MD Initiated Contact with Patient 11/12/14 0031     Chief Complaint  Patient presents with  . Flank Pain     (Consider location/radiation/quality/duration/timing/severity/associated sxs/prior Treatment) HPI Comments: Patient is a 38 year old female with a past medical history of seizures who presents with right flank pain that started this morning. The pain is located in her right flank and does not radiate. The pain is described as aching and severe. The pain started gradually and progressively worsened since the onset. Lying on her back makes the pain better. Palpation and deep inspiration makes the pain worse. The patient has tried nothing for symptoms without relief. Associated symptoms include nausea. Patient denies fever, headache, vomiting, diarrhea, chest pain, SOB, dysuria, constipation, abnormal vaginal bleeding/discharge. Patient denies injury.     Patient is a 38 y.o. female presenting with flank pain.  Flank Pain Associated symptoms include nausea. Pertinent negatives include no abdominal pain, arthralgias, chest pain, chills, fatigue, fever, neck pain, vomiting or weakness.    Past Medical History  Diagnosis Date  . Seizures    Past Surgical History  Procedure Laterality Date  . Tubal ligation     History reviewed. No pertinent family history. History  Substance Use Topics  . Smoking status: Former Smoker -- 0.50 packs/day  . Smokeless tobacco: Not on file  . Alcohol Use: No     Comment: occasional   OB History    No data available     Review of Systems  Constitutional: Negative for fever, chills and fatigue.  HENT: Negative for trouble swallowing.   Eyes: Negative for visual disturbance.  Respiratory: Negative for shortness of breath.   Cardiovascular: Negative for chest pain and palpitations.  Gastrointestinal: Positive for nausea. Negative for vomiting, abdominal pain and diarrhea.   Genitourinary: Positive for flank pain. Negative for dysuria and difficulty urinating.  Musculoskeletal: Negative for arthralgias and neck pain.  Skin: Negative for color change.  Neurological: Negative for dizziness and weakness.  Psychiatric/Behavioral: Negative for dysphoric mood.      Allergies  Aspirin  Home Medications   Prior to Admission medications   Medication Sig Start Date End Date Taking? Authorizing Provider  dextromethorphan (DELSYM) 30 MG/5ML liquid Take 2.5 mLs (15 mg total) by mouth 2 (two) times daily. Patient taking differently: Take 15 mg by mouth 2 (two) times daily as needed for cough.  10/11/14  Yes Montine Circle, PA-C  HYDROcodone-acetaminophen (NORCO/VICODIN) 5-325 MG per tablet Take 2 tablets by mouth every 6 (six) hours as needed for moderate pain.   Yes Historical Provider, MD  naproxen sodium (ANAPROX) 220 MG tablet Take 660 mg by mouth 2 (two) times daily as needed (pain).   Yes Historical Provider, MD  phenytoin (DILANTIN) 100 MG ER capsule Take 300 mg by mouth at bedtime.   Yes Historical Provider, MD  doxycycline (VIBRAMYCIN) 100 MG capsule Take 1 capsule (100 mg total) by mouth 2 (two) times daily. Patient not taking: Reported on 11/11/2014 10/11/14   Montine Circle, PA-C  traMADol (ULTRAM) 50 MG tablet Take 1 tablet (50 mg total) by mouth every 6 (six) hours as needed. Patient not taking: Reported on 11/11/2014 06/24/14   Hyman Bible, PA-C   BP 156/83 mmHg  Pulse 78  Temp(Src) 98.9 F (37.2 C)  Resp 16  SpO2 100%  LMP 10/28/2014 (Approximate) Physical Exam  Constitutional: She is oriented to person, place, and time. She  appears well-developed and well-nourished. No distress.  HENT:  Head: Normocephalic and atraumatic.  Eyes: Conjunctivae and EOM are normal.  Neck: Normal range of motion.  Cardiovascular: Normal rate and regular rhythm.  Exam reveals no gallop and no friction rub.   No murmur heard. Pulmonary/Chest: Effort normal and  breath sounds normal. She has no wheezes. She has no rales. She exhibits tenderness.  Right lateral chest tenderness to palpation. No deformity noted.   Abdominal: Soft. She exhibits no distension. There is no tenderness. There is no rebound.  Genitourinary:  No CVA tenderness.   Musculoskeletal: Normal range of motion.  Neurological: She is alert and oriented to person, place, and time. Coordination normal.  Speech is goal-oriented. Moves limbs without ataxia.   Skin: Skin is warm and dry.  Psychiatric: She has a normal mood and affect. Her behavior is normal.  Nursing note and vitals reviewed.   ED Course  Procedures (including critical care time) Labs Review Labs Reviewed  URINALYSIS, ROUTINE W REFLEX MICROSCOPIC - Abnormal; Notable for the following:    APPearance CLOUDY (*)    Specific Gravity, Urine 1.033 (*)    Protein, ur 30 (*)    Leukocytes, UA SMALL (*)    All other components within normal limits  URINE MICROSCOPIC-ADD ON - Abnormal; Notable for the following:    Squamous Epithelial / LPF MANY (*)    Bacteria, UA MANY (*)    All other components within normal limits  CBC WITH DIFFERENTIAL - Abnormal; Notable for the following:    RBC 3.54 (*)    Hemoglobin 6.8 (*)    HCT 24.0 (*)    MCV 67.8 (*)    MCH 19.2 (*)    MCHC 28.3 (*)    RDW 17.2 (*)    All other components within normal limits  COMPREHENSIVE METABOLIC PANEL - Abnormal; Notable for the following:    Total Bilirubin <0.2 (*)    GFR calc non Af Amer 88 (*)    All other components within normal limits  RETICULOCYTES - Abnormal; Notable for the following:    RBC. 3.68 (*)    All other components within normal limits  PREGNANCY, URINE  LIPASE, BLOOD  VITAMIN B12  FOLATE  IRON AND TIBC  FERRITIN  POC OCCULT BLOOD, ED  POCT GASTRIC OCCULT BLOOD (1-CARD TO LAB)  TYPE AND SCREEN  ABO/RH  PREPARE RBC (CROSSMATCH)    Imaging Review Dg Chest 2 View  11/12/2014   CLINICAL DATA:  Right-sided axillary  and rib pain, no known injury  EXAM: CHEST  2 VIEW  COMPARISON:  10/27/2012  FINDINGS: Cardiac shadow and as at the upper limits of normal in size. The lungs are clear bilaterally. No sizable infiltrate or effusion is seen. No acute bony abnormality is noted.  IMPRESSION: No acute abnormality noted.   Electronically Signed   By: Inez Catalina M.D.   On: 11/12/2014 01:35     EKG Interpretation None      MDM   Final diagnoses:  Rib pain on right side  Other iron deficiency anemias   1:25 AM Labs pending. Vitals stable and patient afebrile. Patient given morphine and zofran for symptoms. Chest xray pending.   1:53 AM Chest xray shows no acute changes. Patient's hemoglobin is 6.8 which is slightly lower than her baseline at 8.5. Patient is PERC negative and her chest pain is reproducible with palpation. Patient likely having chest wall pain. Patient occult stool negative.   4:19 AM Patient  will be admitted for blood transfusion due to no PCP or accessible follow up.   Alvina Chou, PA-C 25/42/70 [id number]  Delora Fuel, MD 62/83/15 [id number]

## 2014-11-12 NOTE — ED Notes (Signed)
Patient transported to X-ray 

## 2014-11-12 NOTE — ED Notes (Signed)
Reported to Dr. Roxanne Mins Hgb of 6.8 called in from lab. He acknowledges, no new orders at this time.

## 2014-11-12 NOTE — Progress Notes (Signed)
Patient Demographics  Victoria Moore, is a 38 y.o. female, DOB - 07-12-1976, GYK:[id number]  Admit date - 11/12/2014   Admitting Physician Berle Mull, MD  Outpatient Primary MD for the patient is No PCP Per Patient  LOS - 0   Chief Complaint  Patient presents with  . Flank Pain      Admission history of present illness/brief narrative: Victoria Moore is a 38 y.o. female with Past medical history of seizures.she presents with complaints of right-sided chest pain, pleuritic, reproducible by palpation, as well complaints of shortness of breath, patient was found to have anemia of hemoglobin of 6.8, patient reports heavy menorrhagia, with history of fibroids,baseline hemoglobin is around 8, today is 6.8 upon presentation, patient was admitted for symptomatic anemia, she was ordered 1 unit packed red blood transfusion for hemoglobin improved from 6.8 to 8.3 , patient anemia panel significant for iron deficiency anemia, as well for low B-12 level as well, so she was started on iron supplement, and B-12 supplement. Patient complains of pleuritic chest pain right-sided, Kai Levins dimers were borderline high, so CT chest angiogram to rule out PE was ordered.    Subjective:   [name] today has, No headache,  No abdominal pain - No Nausea, No new weakness tingling or numbness, No Cough - SOB.   Assessment & Plan    Principal Problem:   Symptomatic anemia Active Problems:   Menorrhagia   Iron deficiency   Right-sided chest pain   Symptomatic anemia -Iron deficiency anemia/B 12 deficiency anemia this is most likely in the setting of heavy menorrhagia, patient was transfused 1 unit packed red blood cell, hemoglobin approved from 6.8 to 8.3. -Continue with iron and B-12 supplements.  Pleuritic chest pain -Has muscular skeletal quality, given the fact she has  borderline d-dimer, will check CT angiogram chest to rule out PE, no acute finding in EKG, as negative troponins.  History of seizures -continue with phenytoin  Code Status: Full  Family Communication:   Disposition Plan: home in 24 hours   Procedures  none   Consults   none   Medications  Scheduled Meds: . ferrous sulfate  325 mg Oral TID WC  . folic acid  1 mg Oral Daily  . phenytoin  300 mg Oral QHS  . [START ON 11/13/2014] pneumococcal 23 valent vaccine  0.5 mL Intramuscular Tomorrow-1000  . sodium chloride  3 mL Intravenous Q12H  . vitamin B-12  1,000 mcg Oral Daily   Continuous Infusions:  PRN Meds:.acetaminophen **OR** acetaminophen, methocarbamol, morphine injection, ondansetron **OR** ondansetron (ZOFRAN) IV, oxyCODONE-acetaminophen  DVT Prophylaxis   SCDs   Lab Results  Component Value Date   PLT 167 11/12/2014    Antibiotics    Anti-infectives    None          Objective:   Filed Vitals:   11/12/14 0900 11/12/14 0930 11/12/14 1215 11/12/14 1441  BP: 117/66 117/67 111/68 104/58  Pulse: 96 84 73 75  Temp: 98.6 F (37 C) 98.1 F (36.7 C) 98 F (36.7 C) 98.5 F (36.9 C)  TempSrc: Oral Oral Oral Oral  Resp: 18 18 18 20   Height:      Weight:      SpO2: 100% 100% 100% 99%  Wt Readings from Last 3 Encounters:  11/12/14 112.4 kg (247 lb 12.8 oz)  06/24/14 111.131 kg (245 lb)  05/08/14 115.667 kg (255 lb)     Intake/Output Summary (Last 24 hours) at 11/12/14 1737 Last data filed at 11/12/14 1300  Gross per 24 hour  Intake    300 ml  Output      0 ml  Net    300 ml     Physical Exam  Awake Alert, Oriented X 3, No new F.N deficits, Normal affect [place].AT,PERRAL Supple Neck,No JVD, No cervical lymphadenopathy appriciated.  Symmetrical Chest wall movement, Good air movement bilaterally, CTAB RRR,No Gallops,Rubs or new Murmurs, No Parasternal Heave,reproducible right-sided chest pain on palpation. +ve B.Sounds, Abd Soft, No  tenderness, No organomegaly appriciated, No rebound - guarding or rigidity. No Cyanosis, Clubbing or edema, No new Rash or bruise    Data Review   Micro Results No results found for this or any previous visit (from the past 240 hour(s)).  Radiology Reports Dg Chest 2 View  11/12/2014   CLINICAL DATA:  Right-sided axillary and rib pain, no known injury  EXAM: CHEST  2 VIEW  COMPARISON:  10/27/2012  FINDINGS: Cardiac shadow and as at the upper limits of normal in size. The lungs are clear bilaterally. No sizable infiltrate or effusion is seen. No acute bony abnormality is noted.  IMPRESSION: No acute abnormality noted.   Electronically Signed   By: Inez Catalina M.D.   On: 11/12/2014 01:35    CBC  Recent Labs Lab 11/12/14 0050 11/12/14 1400  WBC 5.0 7.0  HGB 6.8* 8.3*  HCT 24.0* 28.0*  PLT 198 167  MCV 67.8* 70.4*  MCH 19.2* 20.9*  MCHC 28.3* 29.6*  RDW 17.2* 18.5*  LYMPHSABS 1.7 0.4*  MONOABS 0.5 0.1  EOSABS 0.2 0.1  BASOSABS 0.1 0.0    Chemistries   Recent Labs Lab 11/12/14 0050 11/12/14 1400  NA 141 139  K 4.1 4.1  CL 105 106  CO2 23 21  GLUCOSE 95 139*  BUN 13 9  CREATININE 0.82 0.81  CALCIUM 8.6 8.2*  AST 13 25  ALT 8 17  ALKPHOS 43 44  BILITOT <0.2* 0.4   ------------------------------------------------------------------------------------------------------------------ estimated creatinine clearance is 131.5 mL/min (by C-G formula based on Cr of 0.81). ------------------------------------------------------------------------------------------------------------------ No results for input(s): HGBA1C in the last 72 hours. ------------------------------------------------------------------------------------------------------------------ No results for input(s): CHOL, HDL, LDLCALC, TRIG, CHOLHDL, LDLDIRECT in the last 72 hours. ------------------------------------------------------------------------------------------------------------------ No results for  input(s): TSH, T4TOTAL, T3FREE, THYROIDAB in the last 72 hours.  Invalid input(s): FREET3 ------------------------------------------------------------------------------------------------------------------  Recent Labs  11/12/14 0226  VITAMINB12 341  FOLATE 12.5  FERRITIN 3*  TIBC 446  IRON 10*  RETICCTPCT 0.9    Coagulation profile  Recent Labs Lab 11/12/14 1400  INR 1.11     Recent Labs  11/12/14 1400  DDIMER 0.60*    Cardiac Enzymes  Recent Labs Lab 11/12/14 1400  TROPONINI <0.30   ------------------------------------------------------------------------------------------------------------------ Invalid input(s): POCBNP     Time Spent in minutes   25 minutes    ELGERGAWY, DAWOOD M.D on 11/12/2014 at 5:37 PM  Between 7am to 7pm - Pager - [phone]  After 7pm go to [email or web address] - password [email or web address]  And look for the night coverage person covering for me after hours  [facility] Office  [phone]   **Disclaimer: This note may have been dictated with voice recognition software. Similar sounding words can inadvertently be transcribed and this note may contain transcription errors which  may not have been corrected upon publication of note.**

## 2014-11-13 DIAGNOSIS — R609 Edema, unspecified: Secondary | ICD-10-CM

## 2014-11-13 LAB — CBC
HCT: 29.7 % — ABNORMAL LOW (ref 36.0–46.0)
Hemoglobin: 8.8 g/dL — ABNORMAL LOW (ref 12.0–15.0)
MCH: 20.9 pg — ABNORMAL LOW (ref 26.0–34.0)
MCHC: 29.6 g/dL — ABNORMAL LOW (ref 30.0–36.0)
MCV: 70.4 fL — ABNORMAL LOW (ref 78.0–100.0)
Platelets: 179 10*3/uL (ref 150–400)
RBC: 4.22 MIL/uL (ref 3.87–5.11)
RDW: 18.6 % — ABNORMAL HIGH (ref 11.5–15.5)
WBC: 7.6 10*3/uL (ref 4.0–10.5)

## 2014-11-13 LAB — TYPE AND SCREEN
ABO/RH(D): A POS
Antibody Screen: NEGATIVE
Unit division: 0
Unit division: 0

## 2014-11-13 LAB — BASIC METABOLIC PANEL
Anion gap: 8 (ref 5–15)
BUN: 9 mg/dL (ref 6–23)
CO2: 22 mEq/L (ref 19–32)
Calcium: 8.3 mg/dL — ABNORMAL LOW (ref 8.4–10.5)
Chloride: 107 mEq/L (ref 96–112)
Creatinine, Ser: 0.99 mg/dL (ref 0.50–1.10)
GFR calc Af Amer: 82 mL/min — ABNORMAL LOW (ref >90)
GFR calc non Af Amer: 70 mL/min — ABNORMAL LOW (ref >90)
Glucose, Bld: 91 mg/dL (ref 70–99)
Potassium: 4.5 mEq/L (ref 3.7–5.3)
Sodium: 137 mEq/L (ref 137–147)

## 2014-11-13 MED ORDER — FOLIC ACID 1 MG PO TABS: 1.0000 mg | ORAL_TABLET | Freq: Every day | ORAL | Status: DC

## 2014-11-13 MED ORDER — FERROUS SULFATE 325 (65 FE) MG PO TABS: 325.0000 mg | ORAL_TABLET | Freq: Three times a day (TID) | ORAL | Status: DC

## 2014-11-13 MED ORDER — CEFTRIAXONE SODIUM IN DEXTROSE 40 MG/ML IV SOLN
2.0000 g | INTRAVENOUS | Status: DC
  Administered 2014-11-13: 2 g via INTRAVENOUS
  Filled 2014-11-13: qty 50

## 2014-11-13 MED ORDER — METHOCARBAMOL 500 MG PO TABS: 500.0000 mg | ORAL_TABLET | Freq: Three times a day (TID) | ORAL | Status: DC | PRN

## 2014-11-13 MED ORDER — CIPROFLOXACIN HCL 500 MG PO TABS: 500.0000 mg | ORAL_TABLET | Freq: Two times a day (BID) | ORAL | Status: DC

## 2014-11-13 MED ORDER — OXYCODONE-ACETAMINOPHEN 5-325 MG PO TABS: 1.0000 | ORAL_TABLET | ORAL | Status: DC | PRN

## 2014-11-13 NOTE — Care Management Note (Signed)
    Page 1 of 1   11/13/2014     2:08:49 PM CARE MANAGEMENT NOTE 11/13/2014  Patient:  Victoria Moore, Victoria Moore   Account Number:  [id number]  Date Initiated:  11/13/2014  Documentation initiated by:  Marvetta Gibbons  Subjective/Objective Assessment:   Pt admitted with anemia     Action/Plan:   PTA pt lived at home- plan to return home   Anticipated DC Date:  11/13/2014   Anticipated DC Plan:  [place]  CM consult  PCP issues      Choice offered to / List presented to:             Status of service:  Completed, signed off Medicare Important Message given?  NO (If response is "NO", the following Medicare IM given date fields will be blank) Date Medicare IM given:   Medicare IM given by:   Date Additional Medicare IM given:   Additional Medicare IM given by:    Discharge Disposition:  HOME/SELF CARE  Per UR Regulation:  Reviewed for med. necessity/level of care/duration of stay  If discussed at [place] of Stay Meetings, dates discussed:    Comments:  11/13/14- 1200- Marvetta Gibbons RN BSN [phone] Referral for PCP needs- pt has insurance- spoke with pt at bedside- [facility]/Physician referral line # given to pt and explained use- pt to f/u on finding PCP.

## 2014-11-13 NOTE — Treatment Plan (Signed)
    Victoria Moore MRN: [id number] DOB/AGE: 07-18-1976 38 y.o.  Patient was hospitalised between 11/17-11/18 Patient can go back to work 11/23 Please call [phone] for any further questions   Signed: Reyne Dumas
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
## 2014-11-13 NOTE — Progress Notes (Signed)
On assessment pt reports weakness in both arms R>L. Left hand grip 4/5 and R 3/5 strength. She denies numbness. She denies weakness in the legs or any pain in the neck and shoulders.

## 2014-11-13 NOTE — Progress Notes (Signed)
*  PRELIMINARY RESULTS* Vascular Ultrasound Lower extremity venous duplex has been completed.  Preliminary findings: No evidence of DVT.   Landry Mellow, RDMS, RVT  11/13/2014, 3:30 PM

## 2014-11-13 NOTE — Discharge Summary (Addendum)
Physician Discharge Summary  Victoria Moore MRN: [id number] DOB/AGE: 28-Nov-1976 38 y.o.  PCP: No PCP Per Patient   Admit date: 11/12/2014 Discharge date: 11/13/2014  Discharge Diagnoses:     Symptomatic anemia History of seizure disorder   Menorrhagia   Iron deficiency   Right-sided chest pain   Follow-up recommendations Follow-up with PCP in 5-7 days Follow-up CBC one week     Medication List    STOP taking these medications        doxycycline 100 MG capsule  Commonly known as:  VIBRAMYCIN     HYDROcodone-acetaminophen 5-325 MG per tablet  Commonly known as:  NORCO/VICODIN     naproxen sodium 220 MG tablet  Commonly known as:  ANAPROX     traMADol 50 MG tablet  Commonly known as:  ULTRAM      TAKE these medications        ciprofloxacin 500 MG tablet  Commonly known as:  CIPRO  Take 1 tablet (500 mg total) by mouth 2 (two) times daily.     dextromethorphan 30 MG/5ML liquid  Commonly known as:  DELSYM  Take 2.5 mLs (15 mg total) by mouth 2 (two) times daily.     ferrous sulfate 325 (65 FE) MG tablet  Take 1 tablet (325 mg total) by mouth 3 (three) times daily with meals.     folic acid 1 MG tablet  Commonly known as:  FOLVITE  Take 1 tablet (1 mg total) by mouth daily.     methocarbamol 500 MG tablet  Commonly known as:  ROBAXIN  Take 1 tablet (500 mg total) by mouth every 8 (eight) hours as needed for muscle spasms.     oxyCODONE-acetaminophen 5-325 MG per tablet  Commonly known as:  PERCOCET/ROXICET  Take 1 tablet by mouth every 4 (four) hours as needed for moderate pain or severe pain.     phenytoin 100 MG ER capsule  Commonly known as:  DILANTIN  Take 300 mg by mouth at bedtime.        Discharge Condition: Stable Disposition: 01-Home or Self Care   Consults:  None   Significant Diagnostic Studies: Dg Chest 2 View  11/12/2014   CLINICAL DATA:  Right-sided axillary and rib pain, no known injury  EXAM: CHEST  2 VIEW   COMPARISON:  10/27/2012  FINDINGS: Cardiac shadow and as at the upper limits of normal in size. The lungs are clear bilaterally. No sizable infiltrate or effusion is seen. No acute bony abnormality is noted.  IMPRESSION: No acute abnormality noted.   Electronically Signed   By: Inez Catalina M.D.   On: 11/12/2014 01:35   Ct Angio Chest Pe W/cm &/or Wo Cm  11/12/2014   CLINICAL DATA:  Right-sided chest pain for 2 days  EXAM: CT ANGIOGRAPHY CHEST WITH CONTRAST  TECHNIQUE: Multidetector CT imaging of the chest was performed using the standard protocol during bolus administration of intravenous contrast. Multiplanar CT image reconstructions and MIPs were obtained to evaluate the vascular anatomy.  CONTRAST:  124m OMNIPAQUE IOHEXOL 350 MG/ML SOLN  COMPARISON:  Chest x-ray from earlier in the same day.  FINDINGS: The lungs are well aerated bilaterally without focal infiltrate or sizable effusion. Minimal dependent atelectatic changes are noted in the posterior costophrenic angles.  The thoracic inlet is within normal limits. 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 The visualized aorta is within normal limits without aneurysmal dilatation or dissection. No coronary calcifications are noted. The pulmonary artery is  well visualized and demonstrates a normal branching pattern. No intraluminal filling defect to suggest pulmonary embolism is identified. Soft tissue fullness is noted in the anterior mediastinum likely related to residual thymus tissue. Small right hilar and subcarinal lymph nodes are noted. These are not significant by size criteria. This raises suspicion for underlying sarcoidosis.  The upper abdomen is within normal limits. No acute bony abnormality is noted.  Review of the MIP images confirms the above findings.  IMPRESSION: No evidence of pulmonary emboli.  Mild dependent atelectatic changes.  Small right hilar and subcarinal lymph nodes. This may represent some changes of sarcoidosis.  Clinical correlation is recommended.   Electronically Signed   By: Inez Catalina M.D.   On: 11/12/2014 18:24      Microbiology: No results found for this or any previous visit (from the past 240 hour(s)).   Labs: Results for orders placed or performed during the hospital encounter of 11/12/14 (from the past 48 hour(s))  Urinalysis, Routine w reflex microscopic     Status: Abnormal   Collection Time: 11/11/14 10:36 PM  Result Value Ref Range   Color, Urine YELLOW YELLOW   APPearance CLOUDY (A) CLEAR   Specific Gravity, Urine 1.033 (H) 1.005 - 1.030   pH 7.5 5.0 - 8.0   Glucose, UA NEGATIVE NEGATIVE mg/dL   Hgb urine dipstick NEGATIVE NEGATIVE   Bilirubin Urine NEGATIVE NEGATIVE   Ketones, ur NEGATIVE NEGATIVE mg/dL   Protein, ur 30 (A) NEGATIVE mg/dL   Urobilinogen, UA 0.2 0.0 - 1.0 mg/dL   Nitrite NEGATIVE NEGATIVE   Leukocytes, UA SMALL (A) NEGATIVE  Pregnancy, urine     Status: None   Collection Time: 11/11/14 10:36 PM  Result Value Ref Range   Preg Test, Ur NEGATIVE NEGATIVE    Comment:        THE SENSITIVITY OF THIS METHODOLOGY IS >20 mIU/mL.   Urine microscopic-add on     Status: Abnormal   Collection Time: 11/11/14 10:36 PM  Result Value Ref Range   Squamous Epithelial / LPF MANY (A) RARE   WBC, UA 3-6 <3 WBC/hpf   RBC / HPF 0-2 <3 RBC/hpf   Bacteria, UA MANY (A) RARE  CBC with Differential     Status: Abnormal   Collection Time: 11/12/14 12:50 AM  Result Value Ref Range   WBC 5.0 4.0 - 10.5 K/uL   RBC 3.54 (L) 3.87 - 5.11 MIL/uL   Hemoglobin 6.8 (LL) 12.0 - 15.0 g/dL    Comment: REPEATED TO VERIFY CRITICAL RESULT CALLED TO, READ BACK BY AND VERIFIED WITH: M. WHITE RN [phone] GREEN R    HCT 24.0 (L) 36.0 - 46.0 %   MCV 67.8 (L) 78.0 - 100.0 fL   MCH 19.2 (L) 26.0 - 34.0 pg   MCHC 28.3 (L) 30.0 - 36.0 g/dL   RDW 17.2 (H) 11.5 - 15.5 %   Platelets 198 150 - 400 K/uL   Neutrophils Relative % 53 43 - 77 %   Lymphocytes Relative 34 12 - 46 %   Monocytes  Relative 9 3 - 12 %   Eosinophils Relative 3 0 - 5 %   Basophils Relative 1 0 - 1 %   Neutro Abs 2.5 1.7 - 7.7 K/uL   Lymphs Abs 1.7 0.7 - 4.0 K/uL   Monocytes Absolute 0.5 0.1 - 1.0 K/uL   Eosinophils Absolute 0.2 0.0 - 0.7 K/uL   Basophils Absolute 0.1 0.0 - 0.1 K/uL   RBC Morphology ELLIPTOCYTES  Comment: TARGET CELLS  Comprehensive metabolic panel     Status: Abnormal   Collection Time: 11/12/14 12:50 AM  Result Value Ref Range   Sodium 141 137 - 147 mEq/L   Potassium 4.1 3.7 - 5.3 mEq/L   Chloride 105 96 - 112 mEq/L   CO2 23 19 - 32 mEq/L   Glucose, Bld 95 70 - 99 mg/dL   BUN 13 6 - 23 mg/dL   Creatinine, Ser 0.82 0.50 - 1.10 mg/dL   Calcium 8.6 8.4 - 10.5 mg/dL   Total Protein 7.0 6.0 - 8.3 g/dL   Albumin 3.7 3.5 - 5.2 g/dL   AST 13 0 - 37 U/L   ALT 8 0 - 35 U/L   Alkaline Phosphatase 43 39 - 117 U/L   Total Bilirubin <0.2 (L) 0.3 - 1.2 mg/dL   GFR calc non Af Amer 88 (L) >90 mL/min   GFR calc Af Amer >90 >90 mL/min    Comment: (NOTE) The eGFR has been calculated using the CKD EPI equation. This calculation has not been validated in all clinical situations. eGFR's persistently <90 mL/min signify possible Chronic Kidney Disease.    Anion gap 13 5 - 15  Lipase, blood     Status: None   Collection Time: 11/12/14 12:50 AM  Result Value Ref Range   Lipase 36 11 - 59 U/L  POC occult blood, ED     Status: None   Collection Time: 11/12/14  2:05 AM  Result Value Ref Range   Fecal Occult Bld NEGATIVE NEGATIVE  Type and screen for Red Blood Exchange     Status: None   Collection Time: 11/12/14  2:26 AM  Result Value Ref Range   ABO/RH(D) A POS    Antibody Screen NEG    Sample Expiration 11/15/2014    Unit Number [id number]    Blood Component Type RED CELLS,LR    Unit division 00    Status of Unit ISSUED,FINAL    Transfusion Status OK TO TRANSFUSE    Crossmatch Result Compatible    Unit Number [id number]    Blood Component Type RED CELLS,LR    Unit  division 00    Status of Unit ISSUED,FINAL    Transfusion Status OK TO TRANSFUSE    Crossmatch Result Compatible   Vitamin B12     Status: None   Collection Time: 11/12/14  2:26 AM  Result Value Ref Range   Vitamin B-12 341 211 - 911 pg/mL    Comment: Performed at [facility]  Folate     Status: None   Collection Time: 11/12/14  2:26 AM  Result Value Ref Range   Folate 12.5 ng/mL    Comment: (NOTE) Reference Ranges        Deficient:       0.4 - 3.3 ng/mL        Indeterminate:   3.4 - 5.4 ng/mL        Normal:              > 5.4 ng/mL Performed at [facility]   Iron and TIBC     Status: Abnormal   Collection Time: 11/12/14  2:26 AM  Result Value Ref Range   Iron 10 (L) 42 - 135 ug/dL   TIBC 446 250 - 470 ug/dL   Saturation Ratios 2 (L) 20 - 55 %   UIBC 436 (H) 125 - 400 ug/dL    Comment: Performed at [facility]  Ferritin  Status: Abnormal   Collection Time: 11/12/14  2:26 AM  Result Value Ref Range   Ferritin 3 (L) 10 - 291 ng/mL    Comment: Performed at [facility]  Reticulocytes     Status: Abnormal   Collection Time: 11/12/14  2:26 AM  Result Value Ref Range   Retic Ct Pct 0.9 0.4 - 3.1 %   RBC. 3.68 (L) 3.87 - 5.11 MIL/uL   Retic Count, Manual 33.1 19.0 - 186.0 K/uL  ABO/Rh     Status: None   Collection Time: 11/12/14  2:26 AM  Result Value Ref Range   ABO/RH(D) A POS   Prepare RBC     Status: None   Collection Time: 11/12/14  4:00 AM  Result Value Ref Range   Order Confirmation ORDER PROCESSED BY BLOOD BANK   Troponin I     Status: None   Collection Time: 11/12/14  2:00 PM  Result Value Ref Range   Troponin I <0.30 <0.30 ng/mL    Comment:        Due to the release kinetics of cTnI, a negative result within the first hours of the onset of symptoms does not rule out myocardial infarction with certainty. If myocardial infarction is still suspected, repeat the test at appropriate intervals.   CBC WITH DIFFERENTIAL      Status: Abnormal   Collection Time: 11/12/14  2:00 PM  Result Value Ref Range   WBC 7.0 4.0 - 10.5 K/uL   RBC 3.98 3.87 - 5.11 MIL/uL   Hemoglobin 8.3 (L) 12.0 - 15.0 g/dL    Comment: REPEATED TO VERIFY POST TRANSFUSION SPECIMEN    HCT 28.0 (L) 36.0 - 46.0 %   MCV 70.4 (L) 78.0 - 100.0 fL   MCH 20.9 (L) 26.0 - 34.0 pg   MCHC 29.6 (L) 30.0 - 36.0 g/dL   RDW 18.5 (H) 11.5 - 15.5 %   Platelets 167 150 - 400 K/uL   Neutrophils Relative % 90 (H) 43 - 77 %   Lymphocytes Relative 6 (L) 12 - 46 %   Monocytes Relative 2 (L) 3 - 12 %   Eosinophils Relative 2 0 - 5 %   Basophils Relative 0 0 - 1 %   Neutro Abs 6.4 1.7 - 7.7 K/uL   Lymphs Abs 0.4 (L) 0.7 - 4.0 K/uL   Monocytes Absolute 0.1 0.1 - 1.0 K/uL   Eosinophils Absolute 0.1 0.0 - 0.7 K/uL   Basophils Absolute 0.0 0.0 - 0.1 K/uL   RBC Morphology POLYCHROMASIA PRESENT     Comment: ELLIPTOCYTES  Protime-INR     Status: None   Collection Time: 11/12/14  2:00 PM  Result Value Ref Range   Prothrombin Time 14.4 11.6 - 15.2 seconds   INR 1.11 0.00 - 1.49  Comprehensive metabolic panel     Status: Abnormal   Collection Time: 11/12/14  2:00 PM  Result Value Ref Range   Sodium 139 137 - 147 mEq/L   Potassium 4.1 3.7 - 5.3 mEq/L   Chloride 106 96 - 112 mEq/L   CO2 21 19 - 32 mEq/L   Glucose, Bld 139 (H) 70 - 99 mg/dL   BUN 9 6 - 23 mg/dL   Creatinine, Ser 0.81 0.50 - 1.10 mg/dL   Calcium 8.2 (L) 8.4 - 10.5 mg/dL   Total Protein 6.2 6.0 - 8.3 g/dL   Albumin 3.2 (L) 3.5 - 5.2 g/dL   AST 25 0 - 37 U/L   ALT 17  0 - 35 U/L   Alkaline Phosphatase 44 39 - 117 U/L   Total Bilirubin 0.4 0.3 - 1.2 mg/dL   GFR calc non Af Amer 90 (L) >90 mL/min   GFR calc Af Amer >90 >90 mL/min    Comment: (NOTE) The eGFR has been calculated using the CKD EPI equation. This calculation has not been validated in all clinical situations. eGFR's persistently <90 mL/min signify possible Chronic Kidney Disease.    Anion gap 12 5 - 15  D-dimer, quantitative      Status: Abnormal   Collection Time: 11/12/14  2:00 PM  Result Value Ref Range   D-Dimer, Quant 0.60 (H) 0.00 - 0.48 ug/mL-FEU    Comment:        AT THE INHOUSE ESTABLISHED CUTOFF VALUE OF 0.48 ug/mL FEU, THIS ASSAY HAS BEEN DOCUMENTED IN THE LITERATURE TO HAVE A SENSITIVITY AND NEGATIVE PREDICTIVE VALUE OF AT LEAST 98 TO 99%.  THE TEST RESULT SHOULD BE CORRELATED WITH AN ASSESSMENT OF THE CLINICAL PROBABILITY OF DVT / VTE.   CBC     Status: Abnormal   Collection Time: 11/13/14  4:20 AM  Result Value Ref Range   WBC 7.6 4.0 - 10.5 K/uL   RBC 4.22 3.87 - 5.11 MIL/uL   Hemoglobin 8.8 (L) 12.0 - 15.0 g/dL   HCT 29.7 (L) 36.0 - 46.0 %   MCV 70.4 (L) 78.0 - 100.0 fL   MCH 20.9 (L) 26.0 - 34.0 pg   MCHC 29.6 (L) 30.0 - 36.0 g/dL   RDW 18.6 (H) 11.5 - 15.5 %   Platelets 179 150 - 400 K/uL  Basic metabolic panel     Status: Abnormal   Collection Time: 11/13/14  4:20 AM  Result Value Ref Range   Sodium 137 137 - 147 mEq/L   Potassium 4.5 3.7 - 5.3 mEq/L   Chloride 107 96 - 112 mEq/L   CO2 22 19 - 32 mEq/L   Glucose, Bld 91 70 - 99 mg/dL   BUN 9 6 - 23 mg/dL   Creatinine, Ser 0.99 0.50 - 1.10 mg/dL   Calcium 8.3 (L) 8.4 - 10.5 mg/dL   GFR calc non Af Amer 70 (L) >90 mL/min   GFR calc Af Amer 82 (L) >90 mL/min    Comment: (NOTE) The eGFR has been calculated using the CKD EPI equation. This calculation has not been validated in all clinical situations. eGFR's persistently <90 mL/min signify possible Chronic Kidney Disease.    Anion gap 8 5 - 15     HPI :38 y.o. female with Past medical history of seizures. The patient is presenting with complaints of right-sided chest pain.she mentions that she was sleeping and when she woke up she started having this pain the pain is reproducible is worsening with movement as well as taking a deep breath. She denies any fever chills cough pain anywhere else leg swelling nausea vomiting acid reflux diarrhea burning urination. She denies any  active bleeding or black color bowel movement. She complains of shortness of breath on exertion some ankle swelling. She mentions she has heavy periods which are regular monthly last 7 days and during those 7 days she goes to one pack of pads, which 10-15 on day one. She mentions she has been in the past diagnosed with fibroids  The patient is coming from home. And at her baseline independent for most of her ADL.   HOSPITAL COURSE:  1. Symptomatic anemia The patient is presenting with complaints of  pedal edema shortness of breath and chest pain. On further workup she is found to have hemoglobin of 6.8 with a very low MCV. Most likely etiology is chronic menstrual blood loss. Heavy menstrual periods for 21 years. The patient does not have a PCP and has not seen a gynecologist Status post transfusion of 2 units of packed red blood cell, hemoglobin 8.8 Started on iron and folic acid Recommended follow-up with OB/GYN as an outpatient for further workup of her menorrhagia Repeat CBC in one week  2.Right-sided chest pain Most likely muscle spasm, d-dimer elevated Pain improved after starting the patient on Robaxin EKG negative. Troponin negative CTA of the chest to rule out PE was negative, if venous Doppler negative the patient will discharge home today   3. UTI Patient received Rocephin Will discharge home on ciprofloxacin for 5 days   Discharge Exam:  Blood pressure 122/69, pulse 58, temperature 98.9 F (37.2 C), temperature source Oral, resp. rate 17, height _0  (1.854 m), weight 112.1 kg (247 lb 2.2 oz), last menstrual period 10/28/2014, SpO2 100 %. General: Alert, Awake and Oriented to Time, Place and Person. Appear in mild distress Eyes: PERRL ENT: Oral Mucosa clear moist. Neck: no JVD Cardiovascular: S1 and S2 Present, no Murmur, Peripheral Pulses Present Respiratory: Bilateral Air entry equal and Decreased, Clear to Auscultation, noCrackles, no wheezes Abdomen: Bowel Sound  present, Soft and non tender Skin: no Rash Extremities: no Pedal edema, no calf tenderness Neurologic: Grossly no focal neuro deficit.         Discharge Instructions    Diet - low sodium heart healthy    Complete by:  As directed      Increase activity slowly    Complete by:  As directed           .   SignedReyne Dumas 11/13/2014, 12:18 PM

## 2014-11-13 NOTE — Progress Notes (Signed)
Discharge teaching done. Pt strongly urged to follow up with primary care. She says that she will. Discharge to home.

## 2014-11-13 NOTE — Progress Notes (Signed)
Education provided to patient on benefits of consuming Vitamin C with oral Iron pill for better absorption.  Patient verbalized understanding.  RN will continue to monitor patient.

## 2015-01-09 ENCOUNTER — Ambulatory Visit: Payer: No Typology Code available for payment source | Admitting: Family Medicine

## 2015-01-28 ENCOUNTER — Ambulatory Visit: Payer: No Typology Code available for payment source | Admitting: Family Medicine

## 2015-04-29 ENCOUNTER — Emergency Department (HOSPITAL_COMMUNITY)
Admission: EM | Admit: 2015-04-29 | Discharge: 2015-04-29 | Disposition: A | Discharge status: Home / Self Care | Payer: 59 | Attending: Emergency Medicine | Admitting: Emergency Medicine

## 2015-04-29 ENCOUNTER — Encounter (HOSPITAL_COMMUNITY): Payer: Self-pay | Admitting: Emergency Medicine

## 2015-04-29 DIAGNOSIS — Z3202 Encounter for pregnancy test, result negative: Secondary | ICD-10-CM | POA: Insufficient documentation

## 2015-04-29 DIAGNOSIS — M549 Dorsalgia, unspecified: Secondary | ICD-10-CM

## 2015-04-29 DIAGNOSIS — N390 Urinary tract infection, site not specified: Secondary | ICD-10-CM | POA: Insufficient documentation

## 2015-04-29 DIAGNOSIS — Z792 Long term (current) use of antibiotics: Secondary | ICD-10-CM | POA: Insufficient documentation

## 2015-04-29 DIAGNOSIS — Z87891 Personal history of nicotine dependence: Secondary | ICD-10-CM | POA: Insufficient documentation

## 2015-04-29 DIAGNOSIS — Z79899 Other long term (current) drug therapy: Secondary | ICD-10-CM | POA: Insufficient documentation

## 2015-04-29 DIAGNOSIS — M545 Low back pain: Secondary | ICD-10-CM | POA: Diagnosis present

## 2015-04-29 DIAGNOSIS — G40909 Epilepsy, unspecified, not intractable, without status epilepticus: Secondary | ICD-10-CM | POA: Insufficient documentation

## 2015-04-29 LAB — URINE MICROSCOPIC-ADD ON

## 2015-04-29 LAB — URINALYSIS, ROUTINE W REFLEX MICROSCOPIC
Bilirubin Urine: NEGATIVE
Glucose, UA: NEGATIVE mg/dL
Hgb urine dipstick: NEGATIVE
Ketones, ur: 15 mg/dL — AB
Nitrite: NEGATIVE
Protein, ur: NEGATIVE mg/dL
Specific Gravity, Urine: 1.031 — ABNORMAL HIGH (ref 1.005–1.030)
Urobilinogen, UA: 1 mg/dL (ref 0.0–1.0)
pH: 6 (ref 5.0–8.0)

## 2015-04-29 LAB — POC URINE PREG, ED: Preg Test, Ur: NEGATIVE

## 2015-04-29 MED ORDER — HYDROCODONE-ACETAMINOPHEN 5-325 MG PO TABS
1.0000 | ORAL_TABLET | Freq: Four times a day (QID) | ORAL | Status: DC | PRN
Start: 2015-04-29 — End: 2015-05-21

## 2015-04-29 MED ORDER — HYDROCODONE-ACETAMINOPHEN 5-325 MG PO TABS
2.0000 | ORAL_TABLET | Freq: Once | ORAL | Status: AC
  Administered 2015-04-29: 2 via ORAL
  Filled 2015-04-29: qty 2

## 2015-04-29 MED ORDER — CEPHALEXIN 500 MG PO CAPS: 500.0000 mg | ORAL_CAPSULE | Freq: Four times a day (QID) | ORAL | Status: DC

## 2015-04-29 NOTE — ED Notes (Signed)
Pt Sts she has had back pain for 2 weeks.  Denies any injury.  Sts its worse with movement

## 2015-04-29 NOTE — ED Notes (Addendum)
Pt is requesting assistance getting her medications.  Victoria Moore, [place] notified.

## 2015-04-29 NOTE — ED Provider Notes (Signed)
CSN: [id number]     Arrival date & time 04/29/15  1845 History   First MD Initiated Contact with Patient 04/29/15 1915     Chief Complaint  Patient presents with  . Back Pain     (Consider location/radiation/quality/duration/timing/severity/associated sxs/prior Treatment) HPI Comments: Patient presents to the emergency department with chief complaint of low back pain. She states she has had the pain for the past 2 weeks. She denies any known mechanism of injury. She states that it is worsened with movement and palpation. She denies any bowel or bladder incontinence. Denies any fevers chills. Denies any history of cancer or IV drug use. She states that she hasn't been urinating as frequently and she normally does, but denies any discharge, hematuria, urgency, or frequency. She denies any weakness or tingling of her lower extremities. Denies any radiating pain. She states that she does have some subjective numbness of her right lower extremity.  The history is provided by the patient. No language interpreter was used.    Past Medical History  Diagnosis Date  . Seizures    Past Surgical History  Procedure Laterality Date  . Tubal ligation     History reviewed. No pertinent family history. History  Substance Use Topics  . Smoking status: Former Smoker -- 0.50 packs/day  . Smokeless tobacco: Not on file  . Alcohol Use: No     Comment: occasional   OB History    No data available     Review of Systems  Constitutional: Negative for fever and chills.  Gastrointestinal:       No bowel incontinence  Genitourinary:       No urinary incontinence  Musculoskeletal: Positive for myalgias, back pain and arthralgias.  Neurological:       No saddle anesthesia      Allergies  Aspirin  Home Medications   Prior to Admission medications   Medication Sig Start Date End Date Taking? Authorizing Provider  ciprofloxacin (CIPRO) 500 MG tablet Take 1 tablet (500 mg total) by mouth 2 (two)  times daily. 11/13/14   Reyne Dumas, MD  dextromethorphan (DELSYM) 30 MG/5ML liquid Take 2.5 mLs (15 mg total) by mouth 2 (two) times daily. Patient taking differently: Take 15 mg by mouth 2 (two) times daily as needed for cough.  10/11/14   Montine Circle, PA-C  ferrous sulfate 325 (65 FE) MG tablet Take 1 tablet (325 mg total) by mouth 3 (three) times daily with meals. 11/13/14   Reyne Dumas, MD  folic acid (FOLVITE) 1 MG tablet Take 1 tablet (1 mg total) by mouth daily. 11/13/14   Reyne Dumas, MD  methocarbamol (ROBAXIN) 500 MG tablet Take 1 tablet (500 mg total) by mouth every 8 (eight) hours as needed for muscle spasms. 11/13/14   Reyne Dumas, MD  oxyCODONE-acetaminophen (PERCOCET/ROXICET) 5-325 MG per tablet Take 1 tablet by mouth every 4 (four) hours as needed for moderate pain or severe pain. 11/13/14   Reyne Dumas, MD  phenytoin (DILANTIN) 100 MG ER capsule Take 300 mg by mouth at bedtime.    Historical Provider, MD   BP 131/90 mmHg  Pulse 75  Temp(Src) 98.2 F (36.8 C) (Oral)  Resp 20 Physical Exam  Constitutional: She is oriented to person, place, and time. She appears well-developed and well-nourished. No distress.  HENT:  Head: Normocephalic and atraumatic.  Eyes: Conjunctivae and EOM are normal. Right eye exhibits no discharge. Left eye exhibits no discharge. No scleral icterus.  Neck: Normal range of motion. Neck  supple. No tracheal deviation present.  Cardiovascular: Normal rate, regular rhythm and normal heart sounds.  Exam reveals no gallop and no friction rub.   No murmur heard. Pulmonary/Chest: Effort normal and breath sounds normal. No respiratory distress. She has no wheezes.  Abdominal: Soft. She exhibits no distension. There is no tenderness.  Musculoskeletal: Normal range of motion.  Bilateral lumbar paraspinal muscles tender to palpation, no bony tenderness, step-offs, or gross abnormality or deformity of spine, patient is able to ambulate, moves all  extremities  Bilateral great toe extension intact Bilateral plantar/dorsiflexion intact  Neurological: She is alert and oriented to person, place, and time. She has normal reflexes.  Sensation and strength intact bilaterally Symmetrical reflexes  Skin: Skin is warm. She is not diaphoretic.  Psychiatric: She has a normal mood and affect. Her behavior is normal. Judgment and thought content normal.  Nursing note and vitals reviewed.   ED Course  Procedures (including critical care time) Labs Review Labs Reviewed - No data to display  Imaging Review No results found.   EKG Interpretation None      MDM   Final diagnoses:  Back pain, unspecified location  UTI (lower urinary tract infection)    Patient with low back pain. States she hasn't been urinating as frequently as she normally does. Postvoid bladder scan is unremarkable. Doubt urinary retention. Otherwise, no urinary bladder or bowel symptoms. She does have some subjective numbness of the right lower extremity, but this is not an objective finding. She is able to ambulate appropriately. The pain is worsened with palpation. I suspect this is lumbosacral strain, but could be a radiculopathy given the subjective numbness in the right lower extremity. Will recommend follow-up with primary care provider. Patient understands agrees with plan.  UA remarkable for possible UTI. Will give Keflex. Also give pain medicine. Discharge to home.      Montine Circle, PA-C 04/29/15 2145  Alfonzo Beers, MD 04/29/15 2149

## 2015-04-29 NOTE — ED Notes (Signed)
Pt provided with [facility] information guide with additional information provided by [place].  Pt also encouraged to use pharmacy at [facility] to obtain antibiotic.  Pt verbalized understanding.

## 2015-04-29 NOTE — ED Notes (Signed)
Pt requesting pain medicine.  PA made aware

## 2015-04-29 NOTE — ED Notes (Signed)
Using bladder scanner there was 123 ml post void.

## 2015-04-29 NOTE — Discharge Instructions (Signed)
Back Exercises Back exercises help treat and prevent back injuries. The goal of back exercises is to increase the strength of your abdominal and back muscles and the flexibility of your back. These exercises should be started when you no longer have back pain. Back exercises include:  Pelvic Tilt. Lie on your back with your knees bent. Tilt your pelvis until the lower part of your back is against the floor. Hold this position 5 to 10 sec and repeat 5 to 10 times.  Knee to Chest. Pull first 1 knee up against your chest and hold for 20 to 30 seconds, repeat this with the other knee, and then both knees. This may be done with the other leg straight or bent, whichever feels better.  Sit-Ups or Curl-Ups. Bend your knees 90 degrees. Start with tilting your pelvis, and do a partial, slow sit-up, lifting your trunk only 30 to 45 degrees off the floor. Take at least 2 to 3 seconds for each sit-up. Do not do sit-ups with your knees out straight. If partial sit-ups are difficult, simply do the above but with only tightening your abdominal muscles and holding it as directed.  Hip-Lift. Lie on your back with your knees flexed 90 degrees. Push down with your feet and shoulders as you raise your hips a couple inches off the floor; hold for 10 seconds, repeat 5 to 10 times.  Back arches. Lie on your stomach, propping yourself up on bent elbows. Slowly press on your hands, causing an arch in your low back. Repeat 3 to 5 times. Any initial stiffness and discomfort should lessen with repetition over time.  Shoulder-Lifts. Lie face down with arms beside your body. Keep hips and torso pressed to floor as you slowly lift your head and shoulders off the floor. Do not overdo your exercises, especially in the beginning. Exercises may cause you some mild back discomfort which lasts for a few minutes; however, if the pain is more severe, or lasts for more than 15 minutes, do not continue exercises until you see your caregiver.  Improvement with exercise therapy for back problems is slow.  See your caregivers for assistance with developing a proper back exercise program. Document Released: 01/20/2005 Document Revised: 03/06/2012 Document Reviewed: 10/14/2011 [facility] Patient Information 2015 [place], [place]. This information is not intended to replace advice given to you by your health care provider. Make sure you discuss any questions you have with your health care provider. Urinary Tract Infection Urinary tract infections (UTIs) can develop anywhere along your urinary tract. Your urinary tract is your body's drainage system for removing wastes and extra water. Your urinary tract includes two kidneys, two ureters, a bladder, and a urethra. Your kidneys are a pair of bean-shaped organs. Each kidney is about the size of your fist. They are located below your ribs, one on each side of your spine. CAUSES Infections are caused by microbes, which are microscopic organisms, including fungi, viruses, and bacteria. These organisms are so small that they can only be seen through a microscope. Bacteria are the microbes that most commonly cause UTIs. SYMPTOMS  Symptoms of UTIs may vary by age and gender of the patient and by the location of the infection. Symptoms in young women typically include a frequent and intense urge to urinate and a painful, burning feeling in the bladder or urethra during urination. Older women and men are more likely to be tired, shaky, and weak and have muscle aches and abdominal pain. A fever may mean the  infection is in your kidneys. Other symptoms of a kidney infection include pain in your back or sides below the ribs, nausea, and vomiting. DIAGNOSIS To diagnose a UTI, your caregiver will ask you about your symptoms. Your caregiver also will ask to provide a urine sample. The urine sample will be tested for bacteria and white blood cells. White blood cells are made by your body to help fight  infection. TREATMENT  Typically, UTIs can be treated with medication. Because most UTIs are caused by a bacterial infection, they usually can be treated with the use of antibiotics. The choice of antibiotic and length of treatment depend on your symptoms and the type of bacteria causing your infection. HOME CARE INSTRUCTIONS  If you were prescribed antibiotics, take them exactly as your caregiver instructs you. Finish the medication even if you feel better after you have only taken some of the medication.  Drink enough water and fluids to keep your urine clear or pale yellow.  Avoid caffeine, tea, and carbonated beverages. They tend to irritate your bladder.  Empty your bladder often. Avoid holding urine for long periods of time.  Empty your bladder before and after sexual intercourse.  After a bowel movement, women should cleanse from front to back. Use each tissue only once. SEEK MEDICAL CARE IF:   You have back pain.  You develop a fever.  Your symptoms do not begin to resolve within 3 days. SEEK IMMEDIATE MEDICAL CARE IF:   You have severe back pain or lower abdominal pain.  You develop chills.  You have nausea or vomiting.  You have continued burning or discomfort with urination. MAKE SURE YOU:   Understand these instructions.  Will watch your condition.  Will get help right away if you are not doing well or get worse. Document Released: 09/22/2005 Document Revised: 06/13/2012 Document Reviewed: 01/21/2012 [facility] Patient Information 2015 [place], [place]. This information is not intended to replace advice given to you by your health care provider. Make sure you discuss any questions you have with your health care provider. Back Pain, Adult Low back pain is very common. About 1 in 5 people have back pain.The cause of low back pain is rarely dangerous. The pain often gets better over time.About half of people with a sudden onset of back pain feel better in just 2  weeks. About 8 in 10 people feel better by 6 weeks.  CAUSES Some common causes of back pain include:  Strain of the muscles or ligaments supporting the spine.  Wear and tear (degeneration) of the spinal discs.  Arthritis.  Direct injury to the back. DIAGNOSIS Most of the time, the direct cause of low back pain is not known.However, back pain can be treated effectively even when the exact cause of the pain is unknown.Answering your caregiver's questions about your overall health and symptoms is one of the most accurate ways to make sure the cause of your pain is not dangerous. If your caregiver needs more information, he or she may order lab work or imaging tests (X-rays or MRIs).However, even if imaging tests show changes in your back, this usually does not require surgery. HOME CARE INSTRUCTIONS For many people, back pain returns.Since low back pain is rarely dangerous, it is often a condition that people can learn to [facility] their own.   Remain active. It is stressful on the back to sit or stand in one place. Do not sit, drive, or stand in one place for more than 30 minutes at  a time. Take short walks on level surfaces as soon as pain allows.Try to increase the length of time you walk each day.  Do not stay in bed.Resting more than 1 or 2 days can delay your recovery.  Do not avoid exercise or work.Your body is made to move.It is not dangerous to be active, even though your back may hurt.Your back will likely heal faster if you return to being active before your pain is gone.  Pay attention to your body when you bend and lift. Many people have less discomfortwhen lifting if they bend their knees, keep the load close to their bodies,and avoid twisting. Often, the most comfortable positions are those that put less stress on your recovering back.  Find a comfortable position to sleep. Use a firm mattress and lie on your side with your knees slightly bent. If you lie on your back,  put a pillow under your knees.  Only take over-the-counter or prescription medicines as directed by your caregiver. Over-the-counter medicines to reduce pain and inflammation are often the most helpful.Your caregiver may prescribe muscle relaxant drugs.These medicines help dull your pain so you can more quickly return to your normal activities and healthy exercise.  Put ice on the injured area.  Put ice in a plastic bag.  Place a towel between your skin and the bag.  Leave the ice on for 15-20 minutes, 03-04 times a day for the first 2 to 3 days. After that, ice and heat may be alternated to reduce pain and spasms.  Ask your caregiver about trying back exercises and gentle massage. This may be of some benefit.  Avoid feeling anxious or stressed.Stress increases muscle tension and can worsen back pain.It is important to recognize when you are anxious or stressed and learn ways to manage it.Exercise is a great option. SEEK MEDICAL CARE IF:  You have pain that is not relieved with rest or medicine.  You have pain that does not improve in 1 week.  You have new symptoms.  You are generally not feeling well. SEEK IMMEDIATE MEDICAL CARE IF:   You have pain that radiates from your back into your legs.  You develop new bowel or bladder control problems.  You have unusual weakness or numbness in your arms or legs.  You develop nausea or vomiting.  You develop abdominal pain.  You feel faint. Document Released: 12/13/2005 Document Revised: 06/13/2012 Document Reviewed: 04/16/2014 [facility] Patient Information 2015 [place], [place]. This information is not intended to replace advice given to you by your health care provider. Make sure you discuss any questions you have with your health care provider.

## 2015-05-09 ENCOUNTER — Emergency Department (HOSPITAL_COMMUNITY)
Admission: EM | Admit: 2015-05-09 | Discharge: 2015-05-09 | Disposition: A | Discharge status: Home / Self Care | Payer: 59 | Attending: Emergency Medicine | Admitting: Emergency Medicine

## 2015-05-09 ENCOUNTER — Encounter (HOSPITAL_COMMUNITY): Payer: Self-pay | Admitting: Emergency Medicine

## 2015-05-09 DIAGNOSIS — Z87891 Personal history of nicotine dependence: Secondary | ICD-10-CM | POA: Diagnosis not present

## 2015-05-09 DIAGNOSIS — Z79899 Other long term (current) drug therapy: Secondary | ICD-10-CM | POA: Insufficient documentation

## 2015-05-09 DIAGNOSIS — R569 Unspecified convulsions: Secondary | ICD-10-CM

## 2015-05-09 DIAGNOSIS — R51 Headache: Secondary | ICD-10-CM | POA: Diagnosis not present

## 2015-05-09 DIAGNOSIS — G40909 Epilepsy, unspecified, not intractable, without status epilepticus: Secondary | ICD-10-CM | POA: Insufficient documentation

## 2015-05-09 LAB — CBC WITH DIFFERENTIAL/PLATELET
Basophils Absolute: 0 10*3/uL (ref 0.0–0.1)
Basophils Relative: 0 % (ref 0–1)
Eosinophils Absolute: 0 10*3/uL (ref 0.0–0.7)
Eosinophils Relative: 1 % (ref 0–5)
HCT: 36 % (ref 36.0–46.0)
Hemoglobin: 11.6 g/dL — ABNORMAL LOW (ref 12.0–15.0)
Lymphocytes Relative: 23 % (ref 12–46)
Lymphs Abs: 1 10*3/uL (ref 0.7–4.0)
MCH: 27 pg (ref 26.0–34.0)
MCHC: 32.2 g/dL (ref 30.0–36.0)
MCV: 83.9 fL (ref 78.0–100.0)
Monocytes Absolute: 0.3 10*3/uL (ref 0.1–1.0)
Monocytes Relative: 6 % (ref 3–12)
Neutro Abs: 2.9 10*3/uL (ref 1.7–7.7)
Neutrophils Relative %: 70 % (ref 43–77)
Platelets: 160 10*3/uL (ref 150–400)
RBC: 4.29 MIL/uL (ref 3.87–5.11)
RDW: 14.9 % (ref 11.5–15.5)
WBC: 4.1 10*3/uL (ref 4.0–10.5)

## 2015-05-09 LAB — BASIC METABOLIC PANEL
Anion gap: 10 (ref 5–15)
BUN: 10 mg/dL (ref 6–20)
CO2: 22 mmol/L (ref 22–32)
Calcium: 9 mg/dL (ref 8.9–10.3)
Chloride: 107 mmol/L (ref 101–111)
Creatinine, Ser: 0.94 mg/dL (ref 0.44–1.00)
GFR calc Af Amer: >60 mL/min (ref >60)
GFR calc non Af Amer: >60 mL/min (ref >60)
Glucose, Bld: 91 mg/dL (ref 65–99)
Potassium: 3.8 mmol/L (ref 3.5–5.1)
Sodium: 139 mmol/L (ref 135–145)

## 2015-05-09 LAB — PHENYTOIN LEVEL, TOTAL: Phenytoin Lvl: <2.5 ug/mL — ABNORMAL LOW (ref 10.0–20.0)

## 2015-05-09 MED ORDER — PHENYTOIN SODIUM EXTENDED 100 MG PO CAPS
300.0000 mg | ORAL_CAPSULE | Freq: Every day | ORAL | Status: DC
  Administered 2015-05-09: 300 mg via ORAL
  Filled 2015-05-09: qty 3

## 2015-05-09 MED ORDER — ONDANSETRON 4 MG PO TBDP
8.0000 mg | ORAL_TABLET | Freq: Once | ORAL | Status: AC
  Administered 2015-05-09: 8 mg via ORAL
  Filled 2015-05-09: qty 2

## 2015-05-09 MED ORDER — HYDROCODONE-ACETAMINOPHEN 5-325 MG PO TABS
2.0000 | ORAL_TABLET | Freq: Once | ORAL | Status: AC
  Administered 2015-05-09: 2 via ORAL
  Filled 2015-05-09: qty 2

## 2015-05-09 NOTE — ED Notes (Signed)
Correction to triage note from EMS: Pt states she does feel she may have had a seizure. Pt states that it has been about a year since she has had one, usually absence seizures. She states she was sitting at work and felt like she could hear everyone but couldn't respond- this is how her seizures present. Pt states she has not taken her dilantin in a week due to taking the antibiotics for her UTI.

## 2015-05-09 NOTE — Discharge Instructions (Signed)

## 2015-05-09 NOTE — ED Notes (Signed)
Pt at work- started having HA, light headed/dizzy. Felt like she was going to pass out but never did. Pt has hx of UTI and finished antibiotics yesterday. Pt states she usually only gets HA after having a seizure. She denies having a seizure today. 9/10 pain in her head. Pt still feels lightheaded when standing. CBG 81, SR on monitor. BP 140/86, HR 87, 100% on room air

## 2015-05-09 NOTE — ED Provider Notes (Signed)
CSN: [id number]     Arrival date & time 05/09/15  1232 History   First MD Initiated Contact with Patient 05/09/15 1235     Chief Complaint  Patient presents with  . Headache     (Consider location/radiation/quality/duration/timing/severity/associated sxs/prior Treatment) HPI Comments: Patient presents to the emergency department with chief complaint of seizure. Patient states that she has a history of seizures. 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  She states that she started to feel warm all over at work and then she could hear people around her but she could not interact with them.  She states that she now feels the same as her other post seizure episodes.  Reports mild headache.  She denies numbness, weakness, or slurred speech.  States that she has not been taking her dilantin regularly.  Was also recently being treated for UTI.  She denies fevers, chills, chest pain, SOB, or abdominal pain.  There are no aggravating or alleviating factors.  The history is provided by the patient. No language interpreter was used.    Past Medical History  Diagnosis Date  . Seizures    Past Surgical History  Procedure Laterality Date  . Tubal ligation     History reviewed. No pertinent family history. History  Substance Use Topics  . Smoking status: Former Smoker -- 0.50 packs/day  . Smokeless tobacco: Not on file  . Alcohol Use: No     Comment: occasional   OB History    No data available     Review of Systems  Constitutional: Negative for fever and chills.  Respiratory: Negative for shortness of breath.   Cardiovascular: Negative for chest pain.  Gastrointestinal: Negative for nausea, vomiting, diarrhea and constipation.  Genitourinary: Negative for dysuria.  All other systems reviewed and are negative.     Allergies  Aspirin  Home Medications   Prior to Admission medications   Medication Sig Start Date End Date Taking? Authorizing Provider  ferrous sulfate 325 (65  FE) MG tablet Take 1 tablet (325 mg total) by mouth 3 (three) times daily with meals. 11/13/14  Yes Reyne Dumas, MD  phenytoin (DILANTIN) 100 MG ER capsule Take 300 mg by mouth at bedtime.   Yes Historical Provider, MD  cephALEXin (KEFLEX) 500 MG capsule Take 1 capsule (500 mg total) by mouth 4 (four) times daily. Patient not taking: Reported on 05/09/2015 04/29/15   Montine Circle, PA-C  ciprofloxacin (CIPRO) 500 MG tablet Take 1 tablet (500 mg total) by mouth 2 (two) times daily. Patient not taking: Reported on 05/09/2015 11/13/14   Reyne Dumas, MD  dextromethorphan (DELSYM) 30 MG/5ML liquid Take 2.5 mLs (15 mg total) by mouth 2 (two) times daily. Patient taking differently: Take 15 mg by mouth 2 (two) times daily as needed for cough.  10/11/14   Montine Circle, PA-C  folic acid (FOLVITE) 1 MG tablet Take 1 tablet (1 mg total) by mouth daily. Patient not taking: Reported on 05/09/2015 11/13/14   Reyne Dumas, MD  HYDROcodone-acetaminophen (NORCO/VICODIN) 5-325 MG per tablet Take 1 tablet by mouth every 6 (six) hours as needed. Patient not taking: Reported on 05/09/2015 04/29/15   Montine Circle, PA-C  methocarbamol (ROBAXIN) 500 MG tablet Take 1 tablet (500 mg total) by mouth every 8 (eight) hours as needed for muscle spasms. Patient not taking: Reported on 05/09/2015 11/13/14   Reyne Dumas, MD  oxyCODONE-acetaminophen (PERCOCET/ROXICET) 5-325 MG per tablet Take 1 tablet by mouth every 4 (four) hours as needed for moderate pain or severe  pain. Patient not taking: Reported on 05/09/2015 11/13/14   Reyne Dumas, MD   BP 129/85 mmHg  Temp(Src) 98.7 F (37.1 C) (Oral)  Resp 10  Ht 6\' 1"  (1.854 m)  Wt 263 lb (119.296 kg)  BMI 34.71 kg/m2  SpO2 100%  LMP 04/19/2015 Physical Exam  Constitutional: She is oriented to person, place, and time. She appears well-developed and well-nourished.  HENT:  Head: Normocephalic and atraumatic.  Right Ear: External ear normal.  Left Ear: External ear normal.   Eyes: Conjunctivae and EOM are normal. Pupils are equal, round, and reactive to light.  Neck: Normal range of motion. Neck supple.  No pain with neck flexion, no meningismus  Cardiovascular: Normal rate, regular rhythm and normal heart sounds.  Exam reveals no gallop and no friction rub.   No murmur heard. Pulmonary/Chest: Effort normal and breath sounds normal. No respiratory distress. She has no wheezes. She has no rales. She exhibits no tenderness.  Abdominal: Soft. Bowel sounds are normal. She exhibits no distension and no mass. There is no tenderness. There is no rebound and no guarding.  Musculoskeletal: Normal range of motion. She exhibits no edema or tenderness.  Normal gait.  Neurological: She is alert and oriented to person, place, and time. She has normal reflexes.  CN 3-12 intact, normal finger to nose, no pronator drift, sensation and strength intact bilaterally.  Skin: Skin is warm and dry.  Psychiatric: She has a normal mood and affect. Her behavior is normal. Judgment and thought content normal.  Nursing note and vitals reviewed.   ED Course  Procedures (including critical care time) Results for orders placed or performed during the hospital encounter of 05/09/15  CBC with Differential/Platelet  Result Value Ref Range   WBC 4.1 4.0 - 10.5 K/uL   RBC 4.29 3.87 - 5.11 MIL/uL   Hemoglobin 11.6 (L) 12.0 - 15.0 g/dL   HCT 36.0 36.0 - 46.0 %   MCV 83.9 78.0 - 100.0 fL   MCH 27.0 26.0 - 34.0 pg   MCHC 32.2 30.0 - 36.0 g/dL   RDW 14.9 11.5 - 15.5 %   Platelets 160 150 - 400 K/uL   Neutrophils Relative % 70 43 - 77 %   Neutro Abs 2.9 1.7 - 7.7 K/uL   Lymphocytes Relative 23 12 - 46 %   Lymphs Abs 1.0 0.7 - 4.0 K/uL   Monocytes Relative 6 3 - 12 %   Monocytes Absolute 0.3 0.1 - 1.0 K/uL   Eosinophils Relative 1 0 - 5 %   Eosinophils Absolute 0.0 0.0 - 0.7 K/uL   Basophils Relative 0 0 - 1 %   Basophils Absolute 0.0 0.0 - 0.1 K/uL  Basic metabolic panel  Result Value  Ref Range   Sodium 139 135 - 145 mmol/L   Potassium 3.8 3.5 - 5.1 mmol/L   Chloride 107 101 - 111 mmol/L   CO2 22 22 - 32 mmol/L   Glucose, Bld 91 65 - 99 mg/dL   BUN 10 6 - 20 mg/dL   Creatinine, Ser 0.94 0.44 - 1.00 mg/dL   Calcium 9.0 8.9 - 10.3 mg/dL   GFR calc non Af Amer >60 >60 mL/min   GFR calc Af Amer >60 >60 mL/min   Anion gap 10 5 - 15  Phenytoin level, total  Result Value Ref Range   Phenytoin Lvl <2.5 (L) 10.0 - 20.0 ug/mL   No results found.    EKG Interpretation   Date/Time:  Friday May 09 2015 12:41:41 EDT Ventricular Rate:  80 PR Interval:  153 QRS Duration: 96 QT Interval:  391 QTC Calculation: 451 R Axis:   55 Text Interpretation:  Sinus rhythm Normal ECG Confirmed by POLLINA  MD,  CHRISTOPHER ([id number]) on 05/09/2015 1:07:08 PM      MDM   Final diagnoses:  Seizure    Patient with possible seizure. She has a history of seizures. She has not been taking her to let in. We'll check basic chemistries, EKG, and will reassess. Patient is sitting up in bed alert and oriented now. She complains of mild headache.  Patient's pain is improved. She states that she is feeling relatively normal. Phenytoin level is low as suspected. Believe the seizure to be secondary to medication noncompliance. Will give the patient a dose of heard here. Recommend discharged to home with primary care follow-up. Patient willing to resume taking her Dilantin. Additionally, patient denies any dysuria. States that her symptoms from previous visit have completely resolved.    Montine Circle, PA-C 05/09/15 [place], MD 05/09/15 [phone]

## 2015-05-10 ENCOUNTER — Encounter (HOSPITAL_COMMUNITY): Payer: Self-pay | Admitting: *Deleted

## 2015-05-10 ENCOUNTER — Emergency Department (HOSPITAL_COMMUNITY)
Admission: EM | Admit: 2015-05-10 | Discharge: 2015-05-10 | Disposition: A | Discharge status: Home / Self Care | Payer: 59 | Attending: Emergency Medicine | Admitting: Emergency Medicine

## 2015-05-10 DIAGNOSIS — R51 Headache: Secondary | ICD-10-CM | POA: Diagnosis present

## 2015-05-10 DIAGNOSIS — Z87891 Personal history of nicotine dependence: Secondary | ICD-10-CM | POA: Diagnosis not present

## 2015-05-10 DIAGNOSIS — F419 Anxiety disorder, unspecified: Secondary | ICD-10-CM | POA: Diagnosis not present

## 2015-05-10 DIAGNOSIS — Z79899 Other long term (current) drug therapy: Secondary | ICD-10-CM | POA: Insufficient documentation

## 2015-05-10 DIAGNOSIS — R519 Headache, unspecified: Secondary | ICD-10-CM

## 2015-05-10 DIAGNOSIS — G40909 Epilepsy, unspecified, not intractable, without status epilepticus: Secondary | ICD-10-CM | POA: Insufficient documentation

## 2015-05-10 MED ORDER — HYDROCODONE-ACETAMINOPHEN 5-325 MG PO TABS
1.0000 | ORAL_TABLET | Freq: Once | ORAL | Status: AC
  Administered 2015-05-10: 1 via ORAL
  Filled 2015-05-10: qty 1

## 2015-05-10 MED ORDER — PANTOPRAZOLE SODIUM 40 MG PO TBEC: 40.0000 mg | DELAYED_RELEASE_TABLET | Freq: Every day | ORAL | Status: DC

## 2015-05-10 MED ORDER — LORAZEPAM 1 MG PO TABS
1.0000 mg | ORAL_TABLET | Freq: Once | ORAL | Status: AC
  Administered 2015-05-10: 1 mg via ORAL
  Filled 2015-05-10: qty 1

## 2015-05-10 NOTE — ED Notes (Signed)
Dr. Steinl at the bedside.  

## 2015-05-10 NOTE — ED Notes (Signed)
The pt is c/o felling like she is going to have another  Seizure.  She was off her dilantin for one week a week ago.  She was seen here yesterday after having a seizure and was brought in by ems yesterday.  Dizziness headache nausea

## 2015-05-10 NOTE — ED Provider Notes (Addendum)
CSN: [id number]     Arrival date & time 05/10/15  1938 History   First MD Initiated Contact with Patient 05/10/15 2201     Chief Complaint  Patient presents with  . Headache     (Consider location/radiation/quality/duration/timing/severity/associated sxs/prior Treatment) Patient is a 39 y.o. female presenting with headaches. The history is provided by the patient.  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 Pain dull, intermittent. Same as prior headache. Denies prior specific headache dx or hx migraines. No recent head injury. No syncope. No eye pain or change in vision. No neck pain or stiffness. No sinus drainage or congestion. No change in vision or speech. No numbness/weakness. No change in balance, coordination, or normal functional ability. No fever or chills. Pt unsure of most recent seizure. Denies any seizures today. States her seizures are that she will stare.  States she feels she may get seizure when she feels stressed or jittery, and/or states occasionally will feel as if 'shes not there or is far away'.   States had stopped taking her dilantin up until yesterday when restarted.       Past Medical History  Diagnosis Date  . Seizures    Past Surgical History  Procedure Laterality Date  . Tubal ligation     No family history on file. History  Substance Use Topics  . Smoking status: Former Smoker -- 0.50 packs/day  . Smokeless tobacco: Not on file  . Alcohol Use: No     Comment: occasional   OB History    No data available     Review of Systems  Constitutional: Negative for fever and chills.  HENT: Negative for congestion, sinus pressure and sore throat.    Eyes: Negative for pain and visual disturbance.  Respiratory: Negative for cough and shortness of breath.   Cardiovascular: Negative for chest pain.  Gastrointestinal: Negative for vomiting, abdominal pain and diarrhea.  Genitourinary: Negative for dysuria and flank pain.  Musculoskeletal: Negative for back pain and neck pain.  Skin: Negative for rash.  Neurological: Positive for headaches. Negative for syncope, weakness and numbness.  Hematological: Does not bruise/bleed easily.  Psychiatric/Behavioral: Negative for confusion.      Allergies  Aspirin  Home Medications   Prior to Admission medications   Medication Sig Start Date End Date Taking? Authorizing Provider  cephALEXin (KEFLEX) 500 MG capsule Take 1 capsule (500 mg total) by mouth 4 (four) times daily. Patient not taking: Reported on 05/09/2015 04/29/15   Montine Circle, PA-C  ciprofloxacin (CIPRO) 500 MG tablet Take 1 tablet (500 mg total) by mouth 2 (two) times daily. Patient not taking: Reported on 05/09/2015 11/13/14   Reyne Dumas, MD  dextromethorphan (DELSYM) 30 MG/5ML liquid Take 2.5 mLs (15 mg total) by mouth 2 (two) times daily. Patient taking differently: Take 15 mg by mouth 2 (two) times daily as needed for cough.  10/11/14   Montine Circle, PA-C  ferrous sulfate 325 (65 FE) MG tablet Take 1 tablet (325 mg total) by mouth 3 (three) times daily with meals. 11/13/14   Reyne Dumas, MD  folic acid (FOLVITE) 1 MG tablet Take 1 tablet (1 mg total) by mouth daily. Patient not  taking: Reported on 05/09/2015 11/13/14   Reyne Dumas, MD  HYDROcodone-acetaminophen (NORCO/VICODIN) 5-325 MG per tablet Take 1 tablet by mouth every 6 (six) hours as needed. Patient not taking: Reported on 05/09/2015 04/29/15   Montine Circle, PA-C  methocarbamol (ROBAXIN) 500 MG tablet Take 1 tablet (500 mg total) by mouth every 8 (eight) hours as needed for muscle spasms. Patient not taking: Reported on 05/09/2015 11/13/14   Reyne Dumas, MD   oxyCODONE-acetaminophen (PERCOCET/ROXICET) 5-325 MG per tablet Take 1 tablet by mouth every 4 (four) hours as needed for moderate pain or severe pain. Patient not taking: Reported on 05/09/2015 11/13/14   Reyne Dumas, MD  phenytoin (DILANTIN) 100 MG ER capsule Take 300 mg by mouth at bedtime.    Historical Provider, MD   BP 122/83 mmHg  Pulse 99  Temp(Src) 98.6 F (37 C) (Oral)  Resp 16  Ht 6\' 1"  (1.854 m)  Wt 263 lb (119.296 kg)  BMI 34.71 kg/m2  SpO2 99%  LMP 04/19/2015 Physical Exam  Constitutional: She is oriented to person, place, and time. She appears well-developed and well-nourished. No distress.  HENT:  Head: Atraumatic.  Nose: Nose normal.  Mouth/Throat: Oropharynx is clear and moist.  No sinus or temporal tenderness.  Eyes: Conjunctivae and EOM are normal. Pupils are equal, round, and reactive to light. No scleral icterus.  Neck: Neck supple. No tracheal deviation present. No thyromegaly present.  No stiffness or rigidity.   Cardiovascular: Normal rate, regular rhythm, normal heart sounds and intact distal pulses.  Exam reveals no gallop and no friction rub.   No murmur heard. Pulmonary/Chest: Effort normal and breath sounds normal. No respiratory distress.  Abdominal: Soft. Normal appearance and bowel sounds are normal. She exhibits no distension. There is no tenderness.  Genitourinary:  No cva tenderness.  Musculoskeletal: Normal range of motion. She exhibits no edema or tenderness.  Neurological: She is alert and oriented to person, place, and time. No cranial nerve deficit.  Motor intact bilaterally. sens grossly intact.  Steady gait.   Skin: Skin is warm and dry. No rash noted. She is not diaphoretic.  Psychiatric:  Mildly anxious.   Nursing note and vitals reviewed.   ED Course  Procedures (including critical care time) Labs Review   MDM   Reviewed nursing notes and prior charts for additional history.   Pt requests to be given med to 'prevent  seizure' as she feels she may get one.  Pt is mildly anxious.  Ativan 1 mg po.  Pt states has adequate seizure meds at home, and is taking as prescribed.  Reviewed nursing notes and prior charts for additional history.   Pt indicates has ride home, does not have to drive. Has taken no meds for pain or headache today.  vicodin 1 po.  Pt indicates occasional sense gas or heartburn after eating spicy foods. Hx same. No heartburn or any chest pain currently. Recommend pepcid and maalox prn.  Pt w labs yesterday, unremarkable.   Has adequate meds at home.  Recheck resting comfortably. No seizure activity during period of observation in ED  Pt currently appears stable for d/c.  Will give referral for neurology f/u.  At d/c, pt asks for prescription antacid medication. Will give rx.      Lajean Saver, MD 05/10/15 [phone]

## 2015-05-10 NOTE — ED Notes (Signed)
Seizure pads  Not applied.  Patient has focal seizures only.

## 2015-05-10 NOTE — Discharge Instructions (Signed)
It was our pleasure to provide your ER care today - we hope that you feel better.  Rest. Drink plenty of fluids.   Take your seizure medication as prescribed.  Follow up with primary care doctor in coming week.  For headaches, and history of seizures, follow up with neurologist in the next 1-2 weeks - see referral - call office to arrange appointment.  Return to ER if worse, new symptoms, recurrent seizures, other concern.  You were given pain medication in the ER, no driving for the next 4 hours.     General Headache Without Cause A headache is pain or discomfort felt around the head or neck area. The specific cause of a headache may not be found. There are many causes and types of headaches. A few common ones are:  Tension headaches.  Migraine headaches.  Cluster headaches.  Chronic daily headaches. HOME CARE INSTRUCTIONS   Keep all follow-up appointments with your caregiver or any specialist referral.  Only take over-the-counter or prescription medicines for pain or discomfort as directed by your caregiver.  Lie down in a dark, quiet room when you have a headache.  Keep a headache journal to find out what may trigger your migraine headaches. For example, write down:  What you eat and drink.  How much sleep you get.  Any change to your diet or medicines.  Try massage or other relaxation techniques.  Put ice packs or heat on the head and neck. Use these 3 to 4 times per day for 15 to 20 minutes each time, or as needed.  Limit stress.  Sit up straight, and do not tense your muscles.  Quit smoking if you smoke.  Limit alcohol use.  Decrease the amount of caffeine you drink, or stop drinking caffeine.  Eat and sleep on a regular schedule.  Get 7 to 9 hours of sleep, or as recommended by your caregiver.  Keep lights dim if bright lights bother you and make your headaches worse. SEEK MEDICAL CARE IF:   You have problems with the medicines you were  prescribed.  Your medicines are not working.  You have a change from the usual headache.  You have nausea or vomiting. SEEK IMMEDIATE MEDICAL CARE IF:   Your headache becomes severe.  You have a fever.  You have a stiff neck.  You have loss of vision.  You have muscular weakness or loss of muscle control.  You start losing your balance or have trouble walking.  You feel faint or pass out.  You have severe symptoms that are different from your first symptoms. MAKE SURE YOU:   Understand these instructions.  Will watch your condition.  Will get help right away if you are not doing well or get worse. Document Released: 12/13/2005 Document Revised: 03/06/2012 Document Reviewed: 12/29/2011 [facility] Patient Information 2015 [place], [place]. This information is not intended to replace advice given to you by your health care provider. Make sure you discuss any questions you have with your health care provider.    Seizure, Adult A seizure is abnormal electrical activity in the brain. Seizures usually last from 30 seconds to 2 minutes. There are various types of seizures. Before a seizure, you may have a warning sensation (aura) that a seizure is about to occur. An aura may include the following symptoms:   Fear or anxiety.  Nausea.  Feeling like the room is spinning (vertigo).  Vision changes, such as seeing flashing lights or spots. Common symptoms during a seizure  include:  A change in attention or behavior (altered mental status).  Convulsions with rhythmic jerking movements.  Drooling.  Rapid eye movements.  Grunting.  Loss of bladder and bowel control.  Bitter taste in the mouth.  Tongue biting. After a seizure, you may feel confused and sleepy. You may also have an injury resulting from convulsions during the seizure. HOME CARE INSTRUCTIONS   If you are given medicines, take them exactly as prescribed by your health care provider.  Keep all follow-up  appointments as directed by your health care provider.  Do not swim or drive or engage in risky activity during which a seizure could cause further injury to you or others until your health care provider says it is OK.  Get adequate rest.  Teach friends and family what to do if you have a seizure. They should:  Lay you on the ground to prevent a fall.  Put a cushion under your head.  Loosen any tight clothing around your neck.  Turn you on your side. If vomiting occurs, this helps keep your airway clear.  Stay with you until you recover.  Know whether or not you need emergency care. SEEK IMMEDIATE MEDICAL CARE IF:  The seizure lasts longer than 5 minutes.  The seizure is severe or you do not wake up immediately after the seizure.  You have an altered mental status after the seizure.  You are having more frequent or worsening seizures. Someone should drive you to the emergency department or call local emergency services (911 in [place] MAKE SURE YOU:  Understand these instructions.  Will watch your condition.  Will get help right away if you are not doing well or get worse. Document Released: 12/10/2000 Document Revised: 10/03/2013 Document Reviewed: 07/25/2013 [facility] Patient Information 2015 [place], [place]. This information is not intended to replace advice given to you by your health care provider. Make sure you discuss any questions you have with your health care provider.

## 2015-05-11 ENCOUNTER — Encounter (HOSPITAL_COMMUNITY): Payer: Self-pay | Admitting: Emergency Medicine

## 2015-05-11 DIAGNOSIS — R42 Dizziness and giddiness: Secondary | ICD-10-CM | POA: Insufficient documentation

## 2015-05-11 DIAGNOSIS — Z87891 Personal history of nicotine dependence: Secondary | ICD-10-CM | POA: Diagnosis not present

## 2015-05-11 DIAGNOSIS — Z792 Long term (current) use of antibiotics: Secondary | ICD-10-CM | POA: Insufficient documentation

## 2015-05-11 DIAGNOSIS — R51 Headache: Secondary | ICD-10-CM | POA: Insufficient documentation

## 2015-05-11 DIAGNOSIS — Z79899 Other long term (current) drug therapy: Secondary | ICD-10-CM | POA: Diagnosis not present

## 2015-05-11 DIAGNOSIS — R569 Unspecified convulsions: Secondary | ICD-10-CM | POA: Diagnosis present

## 2015-05-11 NOTE — ED Notes (Signed)
Patient here for third day in a row.  Patient having headaches, dizziness and feeling groggy/forgetfulness.  Patient is feeling like she is having an aura.  She has restarted her dilantin on Friday after thinking she needed to stop for antibiotic use.  Patient has been taking her dilantin everyday since.

## 2015-05-12 ENCOUNTER — Emergency Department (HOSPITAL_COMMUNITY)
Admission: EM | Admit: 2015-05-12 | Discharge: 2015-05-12 | Disposition: A | Discharge status: Home / Self Care | Payer: 59 | Attending: Emergency Medicine | Admitting: Emergency Medicine

## 2015-05-12 DIAGNOSIS — R51 Headache: Secondary | ICD-10-CM

## 2015-05-12 DIAGNOSIS — R42 Dizziness and giddiness: Secondary | ICD-10-CM

## 2015-05-12 DIAGNOSIS — R519 Headache, unspecified: Secondary | ICD-10-CM

## 2015-05-12 LAB — I-STAT CHEM 8, ED
BUN: 15 mg/dL (ref 6–20)
Calcium, Ion: 1.16 mmol/L (ref 1.12–1.23)
Chloride: 105 mmol/L (ref 101–111)
Creatinine, Ser: 1 mg/dL (ref 0.44–1.00)
Glucose, Bld: 104 mg/dL — ABNORMAL HIGH (ref 65–99)
HCT: 39 % (ref 36.0–46.0)
Hemoglobin: 13.3 g/dL (ref 12.0–15.0)
Potassium: 4.9 mmol/L (ref 3.5–5.1)
Sodium: 141 mmol/L (ref 135–145)
TCO2: 24 mmol/L (ref 0–100)

## 2015-05-12 LAB — PHENYTOIN LEVEL, TOTAL: Phenytoin Lvl: <2.5 ug/mL — ABNORMAL LOW (ref 10.0–20.0)

## 2015-05-12 LAB — I-STAT CG4 LACTIC ACID, ED: Lactic Acid, Venous: 1.2 mmol/L (ref 0.5–2.0)

## 2015-05-12 MED ORDER — DIPHENHYDRAMINE HCL 50 MG/ML IJ SOLN
25.0000 mg | Freq: Once | INTRAMUSCULAR | Status: AC
  Administered 2015-05-12: 25 mg via INTRAVENOUS
  Filled 2015-05-12: qty 1

## 2015-05-12 MED ORDER — PHENYTOIN SODIUM EXTENDED 100 MG PO CAPS: 300.0000 mg | ORAL_CAPSULE | Freq: Every day | ORAL | Status: DC

## 2015-05-12 MED ORDER — SODIUM CHLORIDE 0.9 % IV SOLN
1000.0000 mg | Freq: Once | INTRAVENOUS | Status: AC
  Administered 2015-05-12: 1000 mg via INTRAVENOUS
  Filled 2015-05-12: qty 20

## 2015-05-12 MED ORDER — KETOROLAC TROMETHAMINE 30 MG/ML IJ SOLN
30.0000 mg | Freq: Once | INTRAMUSCULAR | Status: AC
  Administered 2015-05-12: 30 mg via INTRAVENOUS
  Filled 2015-05-12: qty 1

## 2015-05-12 MED ORDER — PROCHLORPERAZINE EDISYLATE 5 MG/ML IJ SOLN
10.0000 mg | Freq: Four times a day (QID) | INTRAMUSCULAR | Status: DC | PRN
  Administered 2015-05-12: 10 mg via INTRAVENOUS
  Filled 2015-05-12: qty 2

## 2015-05-12 MED ORDER — ACETAMINOPHEN 325 MG PO TABS
650.0000 mg | ORAL_TABLET | Freq: Once | ORAL | Status: AC
  Administered 2015-05-12: 650 mg via ORAL
  Filled 2015-05-12: qty 2

## 2015-05-12 MED ORDER — SODIUM CHLORIDE 0.9 % IV BOLUS (SEPSIS)
1000.0000 mL | Freq: Once | INTRAVENOUS | Status: AC
  Administered 2015-05-12: 1000 mL via INTRAVENOUS

## 2015-05-12 NOTE — ED Provider Notes (Signed)
CSN: [id number]     Arrival date & time 05/11/15  2304 History   First MD Initiated Contact with Patient 05/12/15 0050     This chart was scribed for Victoria Hacker, MD by Victoria Moore, ED Scribe. This patient was seen in room A09C/A09C and the patient's care was started 12:55 AM.   Chief Complaint  Patient presents with  . Seizures   The history is provided by the patient. No language interpreter was used.    HPI Comments: Victoria Moore is a 39 y.o. female with a PMHx of seizures who presents to the Emergency Department here after a seizure episode 2 days ago. Pt has been evaluated 2 times prior to this encounter. Victoria Moore states symptoms have not improved since after her seizure on Friday. She reports constant, ongoing, unchanged throbbing HA, forgetfulness/grogginess, and dizziness described as the room spinning. Pt states she has been taking all prescribed medications as prescribed and directed. No recent missed dosages. No additional OTC medications or home remedies attempted prior to visit. No recent fever, chills, nausea, vomiting, or visual changes. Pt recently restarted Dilantin medication 2 days ago and has been taking this medications every day since as directed. Pt with known allergy to ASA.  Past Medical History  Diagnosis Date  . Seizures    Past Surgical History  Procedure Laterality Date  . Tubal ligation     History reviewed. No pertinent family history. History  Substance Use Topics  . Smoking status: Former Smoker -- 0.50 packs/day  . Smokeless tobacco: Not on file  . Alcohol Use: No     Comment: occasional   OB History    No data available     Review of Systems  Constitutional: Negative for fever and chills.  Eyes: Negative for visual disturbance.  Respiratory: Negative for chest tightness and shortness of breath.   Cardiovascular: Negative for chest pain.  Gastrointestinal: Negative for nausea, vomiting, abdominal pain and diarrhea.  Genitourinary:  Negative for dysuria.  Musculoskeletal: Negative for back pain.  Neurological: Positive for dizziness and headaches.  Psychiatric/Behavioral: Negative for confusion.  All other systems reviewed and are negative.     Allergies  Aspirin  Home Medications   Prior to Admission medications   Medication Sig Start Date End Date Taking? Authorizing Provider  cephALEXin (KEFLEX) 500 MG capsule Take 1 capsule (500 mg total) by mouth 4 (four) times daily. Patient not taking: Reported on 05/09/2015 04/29/15   Montine Circle, PA-C  ciprofloxacin (CIPRO) 500 MG tablet Take 1 tablet (500 mg total) by mouth 2 (two) times daily. Patient not taking: Reported on 05/09/2015 11/13/14   Reyne Dumas, MD  dextromethorphan (DELSYM) 30 MG/5ML liquid Take 2.5 mLs (15 mg total) by mouth 2 (two) times daily. Patient not taking: Reported on 05/10/2015 10/11/14   Montine Circle, PA-C  ferrous sulfate 325 (65 FE) MG tablet Take 1 tablet (325 mg total) by mouth 3 (three) times daily with meals. 11/13/14   Reyne Dumas, MD  folic acid (FOLVITE) 1 MG tablet Take 1 tablet (1 mg total) by mouth daily. Patient not taking: Reported on 05/09/2015 11/13/14   Reyne Dumas, MD  HYDROcodone-acetaminophen (NORCO/VICODIN) 5-325 MG per tablet Take 1 tablet by mouth every 6 (six) hours as needed. Patient not taking: Reported on 05/09/2015 04/29/15   Montine Circle, PA-C  HYDROcodone-acetaminophen (NORCO/VICODIN) 5-325 MG per tablet Take 1 tablet by mouth every 6 (six) hours as needed for moderate pain.    Historical Provider, MD  methocarbamol (  ROBAXIN) 500 MG tablet Take 1 tablet (500 mg total) by mouth every 8 (eight) hours as needed for muscle spasms. Patient not taking: Reported on 05/09/2015 11/13/14   Reyne Dumas, MD  oxyCODONE-acetaminophen (PERCOCET/ROXICET) 5-325 MG per tablet Take 1 tablet by mouth every 4 (four) hours as needed for moderate pain or severe pain. Patient not taking: Reported on 05/09/2015 11/13/14   Reyne Dumas,  MD  pantoprazole (PROTONIX) 40 MG tablet Take 1 tablet (40 mg total) by mouth daily. 05/10/15   Lajean Saver, MD  phenytoin (DILANTIN) 100 MG ER capsule Take 3 capsules (300 mg total) by mouth at bedtime. 05/12/15   Victoria Hacker, MD   Triage Vitals: BP 134/88 mmHg  Pulse 58  Temp(Src) 97.9 F (36.6 C) (Oral)  Resp 16  Wt 263 lb (119.296 kg)  SpO2 100%  LMP 04/19/2015   Physical Exam  Constitutional: She is oriented to person, place, and time. She appears well-developed and well-nourished. No distress.  HENT:  Head: Normocephalic and atraumatic.  Eyes: EOM are normal. Pupils are equal, round, and reactive to light.  Neck: Neck supple.  No meningismus  Cardiovascular: Normal rate, regular rhythm and normal heart sounds.   No murmur heard. Pulmonary/Chest: Effort normal and breath sounds normal. No respiratory distress. She has no wheezes.  Abdominal: Soft. Bowel sounds are normal. There is no tenderness. There is no rebound.  Neurological: She is alert and oriented to person, place, and time.  No dysmetria to finger-nose-finger, 5 out of 5 strength in all 4 extremities, cranial nerves II through XII intact  Skin: Skin is warm and dry.  Psychiatric: She has a normal mood and affect.  Nursing note and vitals reviewed.   ED Course  Procedures (including critical care time)  DIAGNOSTIC STUDIES: Oxygen Saturation is 100% on RA, Normal by my interpretation.    COORDINATION OF CARE: 1:06 AM- Will order phenytoin levels. Discussed treatment plan with pt at bedside and pt agreed to plan.     Labs Review Labs Reviewed  PHENYTOIN LEVEL, TOTAL - Abnormal; Notable for the following:    Phenytoin Lvl <2.5 (*)    All other components within normal limits  I-STAT CHEM 8, ED - Abnormal; Notable for the following:    Glucose, Bld 104 (*)    All other components within normal limits  I-STAT CG4 LACTIC ACID, ED    Imaging Review No results found.   EKG Interpretation None       MDM   Final diagnoses:  Acute nonintractable headache, unspecified headache type  Dizziness    Patient reports with persistent dizziness and headaches and seizure on Friday. No further seizure activity. Nontoxic and nonfocal on exam. Afebrile. Patient did have elevation in her heart rate and decrease of blood pressure on orthostatics but was not technically orthostatic. Suspect mild dehydration may be contributing to her dizziness. Patient was given fluids. She is given Toradol, Benadryl, and Compazine for her headache. Repeat basic labwork reassuring. Phenytoin level continues to be low. Patient was loaded with IV phenytoin.  Patient now tells me that she lost her bottle of phenytoin yesterday.  On recheck, patient continues to be nontoxic and nonfocal. Headache improved with migraine cocktail. Dizziness is gone following fluids. Will discharge home. Patient given Rx for phenytoin.  I personally performed the services described in this documentation, which was scribed in my presence. The recorded information has been reviewed and is accurate.   Victoria Hacker, MD 05/12/15 [phone]

## 2015-05-12 NOTE — Discharge Instructions (Signed)
You were seen today for headache and dizziness. This is likely somewhat related to mild dehydration. You were also found to be subtherapeutic with her seizure medication. You were given a load dose of her seizure medication. You should maintain good hydration. Take ibuprofen as needed for headache at home.  Dizziness Dizziness is a common problem. It is a feeling of unsteadiness or light-headedness. You may feel like you are about to faint. Dizziness can lead to injury if you stumble or fall. A person of any age group can suffer from dizziness, but dizziness is more common in older adults. CAUSES  Dizziness can be caused by many different things, including:  Middle ear problems.  Standing for too long.  Infections.  An allergic reaction.  Aging.  An emotional response to something, such as the sight of blood.  Side effects of medicines.  Tiredness.  Problems with circulation or blood pressure.  Excessive use of alcohol or medicines, or illegal drug use.  Breathing too fast (hyperventilation).  An irregular heart rhythm (arrhythmia).  A low red blood cell count (anemia).  Pregnancy.  Vomiting, diarrhea, fever, or other illnesses that cause body fluid loss (dehydration).  Diseases or conditions such as Parkinson's disease, high blood pressure (hypertension), diabetes, and thyroid problems.  Exposure to extreme heat. DIAGNOSIS  Your health care provider will ask about your symptoms, perform a physical exam, and perform an electrocardiogram (ECG) to record the electrical activity of your heart. Your health care provider may also perform other heart or blood tests to determine the cause of your dizziness. These may include:  Transthoracic echocardiogram (TTE). During echocardiography, sound waves are used to evaluate how blood flows through your heart.  Transesophageal echocardiogram (TEE).  Cardiac monitoring. This allows your health care provider to monitor your heart rate  and rhythm in real time.  Holter monitor. This is a portable device that records your heartbeat and can help diagnose heart arrhythmias. It allows your health care provider to track your heart activity for several days if needed.  Stress tests by exercise or by giving medicine that makes the heart beat faster. TREATMENT  Treatment of dizziness depends on the cause of your symptoms and can vary greatly. HOME CARE INSTRUCTIONS   Drink enough fluids to keep your urine clear or pale yellow. This is especially important in very hot weather. In older adults, it is also important in cold weather.  Take your medicine exactly as directed if your dizziness is caused by medicines. When taking blood pressure medicines, it is especially important to get up slowly.  Rise slowly from chairs and steady yourself until you feel okay.  In the morning, first sit up on the side of the bed. When you feel okay, stand slowly while holding onto something until you know your balance is fine.  Move your legs often if you need to stand in one place for a long time. Tighten and relax your muscles in your legs while standing.  Have someone stay with you for 1-2 days if dizziness continues to be a problem. Do this until you feel you are well enough to stay alone. Have the person call your health care provider if he or she notices changes in you that are concerning.  Do not drive or use heavy machinery if you feel dizzy.  Do not drink alcohol. SEEK IMMEDIATE MEDICAL CARE IF:   Your dizziness or light-headedness gets worse.  You feel nauseous or vomit.  You have problems talking, walking, or using  your arms, hands, or legs.  You feel weak.  You are not thinking clearly or you have trouble forming sentences. It may take a friend or family member to notice this.  You have chest pain, abdominal pain, shortness of breath, or sweating.  Your vision changes.  You notice any bleeding.  You have side effects from  medicine that seems to be getting worse rather than better. MAKE SURE YOU:   Understand these instructions.  Will watch your condition.  Will get help right away if you are not doing well or get worse. Document Released: 06/08/2001 Document Revised: 12/18/2013 Document Reviewed: 07/02/2011 [facility] Patient Information 2015 [place], [place]. This information is not intended to replace advice given to you by your health care provider. Make sure you discuss any questions you have with your health care provider.

## 2015-05-20 ENCOUNTER — Telehealth: Payer: Self-pay | Admitting: Neurology

## 2015-05-21 ENCOUNTER — Encounter: Payer: Self-pay | Admitting: Neurology

## 2015-05-21 ENCOUNTER — Ambulatory Visit (INDEPENDENT_AMBULATORY_CARE_PROVIDER_SITE_OTHER): Payer: 59 | Admitting: Neurology

## 2015-05-21 VITALS — BP 128/80 | HR 71 | Ht 73.0 in | Wt 256.3 lb

## 2015-05-21 DIAGNOSIS — IMO0001 Reserved for inherently not codable concepts without codable children: Secondary | ICD-10-CM

## 2015-05-21 DIAGNOSIS — R531 Weakness: Secondary | ICD-10-CM

## 2015-05-21 DIAGNOSIS — R569 Unspecified convulsions: Secondary | ICD-10-CM

## 2015-05-21 DIAGNOSIS — R6889 Other general symptoms and signs: Principal | ICD-10-CM

## 2015-05-21 DIAGNOSIS — R413 Other amnesia: Secondary | ICD-10-CM

## 2015-05-21 LAB — VITAMIN B12: Vitamin B-12: 438 pg/mL (ref 211–911)

## 2015-05-21 LAB — TSH: TSH: 1.305 u[IU]/mL (ref 0.350–4.500)

## 2015-05-21 NOTE — Progress Notes (Signed)
Note sent

## 2015-05-21 NOTE — Patient Instructions (Addendum)
1.  Routine EEG 2.  MRI brain wwo contrast 3.  Continue dilantin 300mg  daily for now 4.  Check blood work 5.  No driving as per [place] law for 63-months from episode of alteration of consciousness 6.  Return to clinic in 4-6 weeks  Seizure Precautions:  1. If medication has been prescribed for you to prevent seizures, take it exactly as directed. Do not stop taking the medicine without talking to your doctor first, even if you have not had a seizure in a long time.  2. Avoid activities in which a seizure would cause danger to yourself or to others. Don't operate dangerous machinery, swim alone, or climb in high or dangerous places, such as on ladders, roofs, or girders. Do not drive unless your doctor says you may.  3. If you have any warning that you may have a seizure, lay down in a safe place where you can't hurt yourself.  4. No driving for 6 months from last seizure, as per [facility]. Please refer to the following link on the [place] website for more information: http://www.epilepsyfoundation.org/answerplace/Social/driving/drivingu.cfm  5. Maintain good sleep hygiene.  6. Notify your neurology if you are planning pregnancy or if you become pregnant.  7. Contact your doctor if you have any problems that may be related to the medicine you are taking.  8. Call 911 and bring the patient back to the ED if:  A. The seizure lasts longer than 5 minutes.  B. The patient doesn't awaken shortly after the seizure  C. The patient has new problems such as difficulty seeing, speaking or moving  D. The patient was injured during the seizure  E. The patient has a temperature over 102 F (39C)  F. The patient vomited and now is having trouble breathing   SEIZURE PRECAUTIONS  Bathroom Safety  A person with seizures may want to shower instead of bathe to avoid accidental drowning. If falls occur during the patient's typical seizure, a person should use a shower seat,  preferably one with a safety strap.  . Use nonskid strips in your shower or tub.  . Never use electrical equipment near water. This prevents accidental electrocution.  . Consider changing glass in shower doors to shatterproof glass.   Risk analyst . If possible, cook when someone else is nearby.  . Use the back burners of the stove to prevent accidental burns.  . Use shatterproof containers as much as possible. For instance, sauces can be transferred from glass bottles to plastic containers for use.  . Limit time that is required using knives or other sharp objects. If possible, buy foods that are already cut, or ask someone to help in meal preparation.   General Safety at Home . Do not smoke or light fires in the fireplace unless someone else is present.  . Do not use space heaters that can be accidentally overturned.  . When alone, avoid using step stools or ladders, and do not clean rooftop gutters.  . Purchase power tools and motorized lawn equipment which have a safety switch that will stop the machine if you release the handle (a 'dead man's' switch).   Driving and Transportation . Avoid driving unless your seizures are well controlled and/or you have permission to drive from your state's Department of Motor Vehicles  [facility]). Each state has different laws. Please refer to the following link on the [place] website for more information: http://www.epilepsyfoundation.org/answerplace/Social/driving/drivingu.cfm  . If you ride  a bicycle, wear a helmet and any other necessary protective gear.  . When taking public transportation like the bus or subway, stay clear of the platform edge.   Outdoor Dentist . Swimming is okay, but does present certain risks. Never swim alone, and tell friends what to do if you have a seizure while swimming.  . Wear appropriate protective equipment.  . Ski with a friend. If a seizure occurs, your friend can seek help, if  needed. He or she can also help to get you out of the cold. Consider using a safety hook or belt while riding the ski lift.   Safety Issues for Parents . Feed, nurse, dress, and change your infant while sitting on the floor, in a well-protected area.  Yevette Edwards your house as much as possible. If you are home alone with your child, consider using a safe play area or playpen. Use child safety gates to prevent a child from falling down stairs or to prevent your child from wandering in the event that you have a seizure.  . As your child grows, explain what seizures are in terms that he or she can understand. Some people perform 'seizure drills.' Many people teach their children how to call 911 in an emergency.

## 2015-05-21 NOTE — Progress Notes (Signed)
[facility] Clinic Note - Initial Visit   Date: 05/21/2015   Victoria Moore MRN: [id number] DOB: 08/15/1976   Dear Dr. Cheron Schaumann:    Thank you for your kind referral of Victoria Moore for consultation of seizures. Although her history is well known to you, please allow [place] to reiterate it for the purpose of our medical record. The patient was accompanied to the clinic by self.    History of Present Illness: Victoria Moore is a 39 y.o. right-handed African American female with recently diagnosed sarcoidosis presenting for evaluation of seizure disorder.    Seizures began when she was in 6th grade at the age of 20. She had her first seizure at school and one at home, so was started on dilantin.  She did well until April 2015, when she had another seizure.  Seizures described as "passing out", but she can hear what is going on, but her eyes remain closed and she cannot move.  There is no limb shaking or jerking. She endorses urinary incontinence, no bowel incontinence.  No tongue biting.  It will last about 3-4 minutes.  Following this, she reports being groggy, dizzy, and echos which can last 1-2 days.    Seizures were well controlled on dilantin and she was even taken off the medication as some point, but developed another spell so continued on it.  Overall, it seems that she was event-free until April 2015 and again did will until May 2016 when she started having spells 1-2 times per week.  Her last event occurred 2 days ago while she was laying in bed.  Prior to that, she was at work and sitting in a chair.  There has been no injuries with any of her spells.   For the past two weeks, she feels that she is forgetful and has developed headaches.  She is taking dilantin 300mg  daily.  Stress is a trigger. No aura.    There is no history of status epilepticus.   Possible lateralizing signs by history: None  Birth and Early Development:  Birth history is unknown as she  was adopted at 6 weeks.  Developmental milestones all met.  RISK FACTORS FOR SEIZURES:   1. Head Trauma:  No; 2. CNS Infections:  no; 3. Family History of Seizures: no 4. Developmental Delay No; 5. Febrile Seizures no;  6. CNS Tumors No; 7. CNS Vascular Disease NO  CURRENT ANTICONVULSANTS: Dilantin 300mg  daily  The patient forgets a dose: never The patient's side effects to the current medications:  dizziness  PRIOR ANTICONVULSANT HISTORY: None  Out-side paper records, electronic medical record, and images have been reviewed where available and summarized as:  CT head 04/02/2014:  Normal Lab Results  Component Value Date   PHENYTOIN <2.5* 05/11/2015     Past Medical History  Diagnosis Date  . Seizures   . Anemia   . Sarcoidosis     Past Surgical History  Procedure Laterality Date  . Tubal ligation       Medications:  Current Outpatient Prescriptions on File Prior to Visit  Medication Sig Dispense Refill  . ferrous sulfate 325 (65 FE) MG tablet Take 1 tablet (325 mg total) by mouth 3 (three) times daily with meals. 90 tablet 3  . pantoprazole (PROTONIX) 40 MG tablet Take 1 tablet (40 mg total) by mouth daily. 20 tablet 0  . phenytoin (DILANTIN) 100 MG ER capsule Take 3 capsules (300 mg total) by mouth at bedtime. 60 capsule 0  No current facility-administered medications on file prior to visit.    Allergies:  Allergies  Allergen Reactions  . Aspirin Hives    Family History: Family History  Problem Relation Age of Onset  . Adopted: Yes  . Other Son     Growing pains    Social History: History   Social History  . Marital Status: Married    Spouse Name: N/A  . Number of Children: N/A  . Years of Education: N/A   Occupational History  . Not on file.   Social History Main Topics  . Smoking status: Former Smoker -- 0.20 packs/day for 17 years    Types: Cigarettes  . Smokeless tobacco: Not on file     Comment: Quit 2011  . Alcohol Use: No  . Drug  Use: No     Comment: former  . Sexual Activity: Not on file   Other Topics Concern  . Not on file   Social History Narrative   She lives alone. She has one son. 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    Highest level of education:  12th grade, did no graduate.    Review of Systems:  CONSTITUTIONAL: No fevers, chills, night sweats, or weight loss.   EYES: No visual changes or eye pain ENT: No hearing changes.  No history of nose bleeds.   RESPIRATORY: No cough, wheezing and shortness of breath.   CARDIOVASCULAR: Negative for chest pain, and palpitations.   GI: Negative for abdominal discomfort, blood in stools or black stools.  No recent change in bowel habits.   GU:  No history of incontinence.   MUSCLOSKELETAL: No history of joint pain or swelling.  No myalgias.   SKIN: Negative for lesions, rash, and itching.   HEMATOLOGY/ONCOLOGY: Negative for prolonged bleeding, bruising easily, and swollen nodes.  No history of cancer.   ENDOCRINE: Negative for cold or heat intolerance, polydipsia or goiter.   PSYCH:  No depression or anxiety symptoms.   NEURO: As Above.   Vital Signs:  BP 128/80 mmHg  Pulse 71  Ht $R'6\' 1"'yi$  (1.854 m)  Wt 256 lb 5 oz (116.263 kg)  BMI 33.82 kg/m2  SpO2 97%  LMP 04/19/2015   General Medical Exam:   General:  Well appearing, comfortable.   Eyes/ENT: see cranial nerve examination.   Neck: No masses appreciated.  Full range of motion without tenderness.  No carotid bruits. Respiratory:  Clear to auscultation, good air entry bilaterally.   Cardiac:  Regular rate and rhythm, no murmur.   Extremities:  No deformities, edema, or skin discoloration.  Skin:  No rashes or lesions.  Neurological Exam: MENTAL STATUS including orientation to time, place, person, recent and remote memory, attention span and concentration, language, and fund of knowledge is normal.  Speech is not dysarthric.  CRANIAL NERVES: II:  No visual field  defects.  Unremarkable fundi.   III-IV-VI: Pupils equal round and reactive to light.  Normal conjugate, extra-ocular eye movements in all directions of gaze.  No nystagmus.  No ptosis.   V:  Normal facial sensation.     VII:  Normal facial symmetry and movements.  No pathologic facial reflexes.  VIII:  Normal hearing and vestibular function.   IX-X:  Normal palatal movement.   XI:  Normal shoulder shrug and head rotation.   XII:  Normal tongue strength and range of motion, no deviation or fasciculation.  MOTOR:  No atrophy, fasciculations or abnormal movements.  No  pronator drift.  Tone is normal.    Right Upper Extremity:    Left Upper Extremity:    Deltoid  5/5   Deltoid  5/5   Biceps  5/5   Biceps  5/5   Triceps  5/5   Triceps  5/5   Wrist extensors  5/5   Wrist extensors  5/5   Wrist flexors  5/5   Wrist flexors  5/5   Finger extensors  5/5   Finger extensors  5/5   Finger flexors  5/5   Finger flexors  5/5   Dorsal interossei  5/5   Dorsal interossei  5/5   Abductor pollicis  5/5   Abductor pollicis  5/5   Tone (Ashworth scale)  0  Tone (Ashworth scale)  0   Right Lower Extremity:    Left Lower Extremity:    Hip flexors  5/5   Hip flexors  5/5   Hip extensors  5/5   Hip extensors  5/5   Knee flexors  5/5   Knee flexors  5/5   Knee extensors  5/5   Knee extensors  5/5   Dorsiflexors  5/5   Dorsiflexors  5/5   Plantarflexors  5/5   Plantarflexors  5/5   Toe extensors  5/5   Toe extensors  5/5   Toe flexors  5/5   Toe flexors  5/5   Tone (Ashworth scale)  0  Tone (Ashworth scale)  0   MSRs:  Right                                                                 Left brachioradialis 2+  brachioradialis 2+  biceps 2+  biceps 2+  triceps 2+  triceps 2+  patellar 2+  patellar 2+  ankle jerk 2+  ankle jerk 2+  Hoffman no  Hoffman no  plantar response down  plantar response down   SENSORY:  Normal and symmetric perception of light touch, pinprick, vibration, and proprioception.   Romberg's sign absent.   COORDINATION/GAIT: Normal finger-to- nose-finger and heel-to-shin.  Intact rapid alternating movements bilaterally.  Able to rise from a chair without using arms.  Gait narrow based and stable. Tandem and stressed gait intact.    IMPRESSION: Ms. Victoria Moore is a 39 year-old female presenting for evaluation of seizure-like spells.  Per history, symptoms are manifested by loss of muscle tone with retained consciousness, as suggested by the fact that she can hear everything.  There is no abnormal jerking or movements of her limbs.  She has been on dilantin for these spells since the age of 66 and over the past few weeks, these spells increased in frequency.  CT head from 2015 was reviewed and does not show any abnormalities.  She has never had MRI brain or EEG to evaluate her spells.  We discussed alternative possibilities including syncope, especially as her history is not clear on whether this, indeed, represents a seizure disorder.  If EEG is normal, may need to get extended study to capture these events.   PLAN/RECOMMENDATIONS:  1.  Routine EEG 2.  MRI brain wwo contrast 3.  Continue dilantin $RemoveBeforeDEI'300mg'aMkmbrYEjpnFAdGO$  daily for now, if normal EEG, will transition her to topamax 4.  Check TSH, vitamin B12, vitamin D for  malaise  5.  No driving as per [place] law for 30-months from episode of alteration of consciousness 6.  Seizure precautions discussed and literature provided. 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 7.  Return to clinic in 4-6 weeks   The duration of this appointment visit was 50 minutes of face-to-face time with the patient.  Greater than 50% of this time was spent in counseling, explanation of diagnosis, planning of further management, and coordination of care.   Thank you for allowing me to participate in patient's care.  If I can answer any additional questions, I would be  pleased to do so.    Sincerely,    Victoria K. Posey Pronto, DO

## 2015-05-21 NOTE — Telephone Encounter (Signed)
Error

## 2015-05-22 ENCOUNTER — Other Ambulatory Visit: Payer: Self-pay | Admitting: Neurology

## 2015-05-22 ENCOUNTER — Telehealth: Payer: Self-pay | Admitting: Neurology

## 2015-05-22 ENCOUNTER — Telehealth: Payer: Self-pay | Admitting: *Deleted

## 2015-05-22 ENCOUNTER — Ambulatory Visit
Admission: RE | Admit: 2015-05-22 | Discharge: 2015-05-22 | Disposition: A | Discharge status: Home / Self Care | Payer: 59 | Source: Ambulatory Visit | Attending: Neurology | Admitting: Neurology

## 2015-05-22 DIAGNOSIS — R6889 Other general symptoms and signs: Principal | ICD-10-CM

## 2015-05-22 DIAGNOSIS — R569 Unspecified convulsions: Secondary | ICD-10-CM

## 2015-05-22 DIAGNOSIS — R413 Other amnesia: Secondary | ICD-10-CM

## 2015-05-22 DIAGNOSIS — R531 Weakness: Secondary | ICD-10-CM

## 2015-05-22 DIAGNOSIS — IMO0001 Reserved for inherently not codable concepts without codable children: Secondary | ICD-10-CM

## 2015-05-22 LAB — VITAMIN D 25 HYDROXY (VIT D DEFICIENCY, FRACTURES): Vit D, 25-Hydroxy: 8 ng/mL — ABNORMAL LOW (ref 30–100)

## 2015-05-22 MED ORDER — VITAMIN D (ERGOCALCIFEROL) 1.25 MG (50000 UNIT) PO CAPS: 50000.0000 [IU] | ORAL_CAPSULE | ORAL | Status: DC

## 2015-05-22 NOTE — Telephone Encounter (Signed)
Patient given results and instructions.   

## 2015-05-22 NOTE — Telephone Encounter (Signed)
Authorization #:  [id number] Exp.  07-06-15

## 2015-05-22 NOTE — Telephone Encounter (Signed)
Please inform patient that her vitamin D is very low and I will send prescriptions for vitamin D 50,000 units 1 tablet weekly x 8 weeks, then she should start 1000units daily.  Remaining labs looked good.

## 2015-05-29 NOTE — Procedures (Signed)
ELECTROENCEPHALOGRAM REPORT  Date of Study: 05/21/2015  Patient's Name: Victoria Moore MRN: [id number] Date of Birth: 1976-04-08  Referring Provider: Dr. Narda Amber  Clinical History: This is a 39 year old woman with episodes of loss of muscle tone with retained consciousness. EEG for classification.  Medications: Dilantin  Technical Summary: A multichannel digital EEG recording measured by the international 10-20 system with electrodes applied with paste and impedances below 5000 ohms performed in our laboratory with EKG monitoring in an awake and asleep patient.  Hyperventilation and photic stimulation were performed.  The digital EEG was referentially recorded, reformatted, and digitally filtered in a variety of bipolar and referential montages for optimal display.    Description: The patient is awake and asleep during the recording.  During maximal wakefulness, there is a symmetric, medium voltage 9 Hz posterior dominant rhythm that attenuates with eye opening.  The record is symmetric.  During drowsiness and stage I sleep, there is an increase in theta slowing of the background with occasional vertex waves seen.  Hyperventilation and photic stimulation did not elicit any abnormalities.  There were no epileptiform discharges or electrographic seizures seen.    EKG lead was unremarkable.  Impression: This awake and asleep EEG is normal.    Clinical Correlation: A normal EEG does not exclude a clinical diagnosis of epilepsy.  If further clinical questions remain, prolonged EEG may be helpful.  Clinical correlation is advised.   Ellouise Newer, M.D.

## 2015-05-30 ENCOUNTER — Other Ambulatory Visit: Payer: Self-pay | Admitting: *Deleted

## 2015-05-30 DIAGNOSIS — IMO0001 Reserved for inherently not codable concepts without codable children: Secondary | ICD-10-CM

## 2015-05-30 DIAGNOSIS — R569 Unspecified convulsions: Secondary | ICD-10-CM

## 2015-05-30 DIAGNOSIS — R6889 Other general symptoms and signs: Secondary | ICD-10-CM

## 2015-06-02 ENCOUNTER — Telehealth: Payer: Self-pay | Admitting: Neurology

## 2015-06-02 NOTE — Telephone Encounter (Signed)
See next note

## 2015-06-02 NOTE — Telephone Encounter (Signed)
Pt has questions, I was unable to get more detail. Please call [phone] / Sherri S.

## 2015-06-02 NOTE — Telephone Encounter (Signed)
Can this be closed?  / Victoria Moore

## 2015-06-05 ENCOUNTER — Other Ambulatory Visit: Payer: Self-pay | Admitting: *Deleted

## 2015-06-05 DIAGNOSIS — R413 Other amnesia: Secondary | ICD-10-CM

## 2015-06-05 DIAGNOSIS — IMO0001 Reserved for inherently not codable concepts without codable children: Secondary | ICD-10-CM

## 2015-06-05 DIAGNOSIS — R569 Unspecified convulsions: Secondary | ICD-10-CM

## 2015-06-05 DIAGNOSIS — R6889 Other general symptoms and signs: Secondary | ICD-10-CM

## 2015-06-11 ENCOUNTER — Encounter: Payer: Self-pay | Admitting: *Deleted

## 2015-06-18 ENCOUNTER — Other Ambulatory Visit: Payer: Self-pay | Admitting: *Deleted

## 2015-06-18 DIAGNOSIS — R569 Unspecified convulsions: Secondary | ICD-10-CM

## 2015-06-18 DIAGNOSIS — R413 Other amnesia: Secondary | ICD-10-CM

## 2015-06-18 DIAGNOSIS — IMO0001 Reserved for inherently not codable concepts without codable children: Secondary | ICD-10-CM

## 2015-06-18 DIAGNOSIS — R6889 Other general symptoms and signs: Principal | ICD-10-CM

## 2015-06-18 DIAGNOSIS — R531 Weakness: Secondary | ICD-10-CM

## 2015-07-10 ENCOUNTER — Ambulatory Visit: Payer: 59 | Admitting: Neurology

## 2015-07-15 ENCOUNTER — Other Ambulatory Visit: Payer: 59

## 2015-07-16 ENCOUNTER — Ambulatory Visit (HOSPITAL_BASED_OUTPATIENT_CLINIC_OR_DEPARTMENT_OTHER): Payer: 59

## 2015-07-28 ENCOUNTER — Encounter (HOSPITAL_COMMUNITY): Payer: Self-pay | Admitting: Emergency Medicine

## 2015-07-28 DIAGNOSIS — R112 Nausea with vomiting, unspecified: Secondary | ICD-10-CM | POA: Insufficient documentation

## 2015-07-28 DIAGNOSIS — D649 Anemia, unspecified: Secondary | ICD-10-CM | POA: Insufficient documentation

## 2015-07-28 DIAGNOSIS — Z79899 Other long term (current) drug therapy: Secondary | ICD-10-CM | POA: Insufficient documentation

## 2015-07-28 DIAGNOSIS — A599 Trichomoniasis, unspecified: Secondary | ICD-10-CM | POA: Insufficient documentation

## 2015-07-28 DIAGNOSIS — Z3202 Encounter for pregnancy test, result negative: Secondary | ICD-10-CM | POA: Insufficient documentation

## 2015-07-28 DIAGNOSIS — G40909 Epilepsy, unspecified, not intractable, without status epilepticus: Secondary | ICD-10-CM | POA: Insufficient documentation

## 2015-07-28 DIAGNOSIS — Z87891 Personal history of nicotine dependence: Secondary | ICD-10-CM | POA: Insufficient documentation

## 2015-07-28 DIAGNOSIS — R51 Headache: Secondary | ICD-10-CM | POA: Insufficient documentation

## 2015-07-28 DIAGNOSIS — N390 Urinary tract infection, site not specified: Secondary | ICD-10-CM | POA: Insufficient documentation

## 2015-07-28 NOTE — ED Notes (Signed)
Pt. reports nausea and vomitting onset this afternoon , denies diarrhea , no fever or chills.

## 2015-07-29 ENCOUNTER — Emergency Department (HOSPITAL_COMMUNITY)
Admission: EM | Admit: 2015-07-29 | Discharge: 2015-07-29 | Disposition: A | Discharge status: Home / Self Care | Payer: 59 | Attending: Emergency Medicine | Admitting: Emergency Medicine

## 2015-07-29 DIAGNOSIS — N390 Urinary tract infection, site not specified: Secondary | ICD-10-CM

## 2015-07-29 DIAGNOSIS — A599 Trichomoniasis, unspecified: Secondary | ICD-10-CM

## 2015-07-29 DIAGNOSIS — R112 Nausea with vomiting, unspecified: Secondary | ICD-10-CM

## 2015-07-29 LAB — CBC WITH DIFFERENTIAL/PLATELET
Basophils Absolute: 0 10*3/uL (ref 0.0–0.1)
Basophils Relative: 0 % (ref 0–1)
Eosinophils Absolute: 0.2 10*3/uL (ref 0.0–0.7)
Eosinophils Relative: 4 % (ref 0–5)
HCT: 33.3 % — ABNORMAL LOW (ref 36.0–46.0)
Hemoglobin: 10.9 g/dL — ABNORMAL LOW (ref 12.0–15.0)
Lymphocytes Relative: 36 % (ref 12–46)
Lymphs Abs: 1.8 10*3/uL (ref 0.7–4.0)
MCH: 28 pg (ref 26.0–34.0)
MCHC: 32.7 g/dL (ref 30.0–36.0)
MCV: 85.6 fL (ref 78.0–100.0)
Monocytes Absolute: 0.3 10*3/uL (ref 0.1–1.0)
Monocytes Relative: 7 % (ref 3–12)
Neutro Abs: 2.6 10*3/uL (ref 1.7–7.7)
Neutrophils Relative %: 53 % (ref 43–77)
Platelets: 170 10*3/uL (ref 150–400)
RBC: 3.89 MIL/uL (ref 3.87–5.11)
RDW: 13.3 % (ref 11.5–15.5)
WBC: 4.9 10*3/uL (ref 4.0–10.5)

## 2015-07-29 LAB — COMPREHENSIVE METABOLIC PANEL
ALT: 11 U/L — ABNORMAL LOW (ref 14–54)
AST: 17 U/L (ref 15–41)
Albumin: 3.6 g/dL (ref 3.5–5.0)
Alkaline Phosphatase: 41 U/L (ref 38–126)
Anion gap: 7 (ref 5–15)
BUN: 9 mg/dL (ref 6–20)
CO2: 25 mmol/L (ref 22–32)
Calcium: 8.6 mg/dL — ABNORMAL LOW (ref 8.9–10.3)
Chloride: 106 mmol/L (ref 101–111)
Creatinine, Ser: 1.06 mg/dL — ABNORMAL HIGH (ref 0.44–1.00)
GFR calc Af Amer: >60 mL/min (ref >60)
GFR calc non Af Amer: >60 mL/min (ref >60)
Glucose, Bld: 117 mg/dL — ABNORMAL HIGH (ref 65–99)
Potassium: 3.6 mmol/L (ref 3.5–5.1)
Sodium: 138 mmol/L (ref 135–145)
Total Bilirubin: 0.2 mg/dL — ABNORMAL LOW (ref 0.3–1.2)
Total Protein: 6.7 g/dL (ref 6.5–8.1)

## 2015-07-29 LAB — URINE MICROSCOPIC-ADD ON

## 2015-07-29 LAB — URINALYSIS, ROUTINE W REFLEX MICROSCOPIC
Bilirubin Urine: NEGATIVE
Glucose, UA: NEGATIVE mg/dL
Hgb urine dipstick: NEGATIVE
Ketones, ur: NEGATIVE mg/dL
Nitrite: NEGATIVE
Protein, ur: NEGATIVE mg/dL
Specific Gravity, Urine: 1.016 (ref 1.005–1.030)
Urobilinogen, UA: 0.2 mg/dL (ref 0.0–1.0)
pH: 6 (ref 5.0–8.0)

## 2015-07-29 LAB — POC URINE PREG, ED: Preg Test, Ur: NEGATIVE

## 2015-07-29 MED ORDER — ONDANSETRON 4 MG PO TBDP
ORAL_TABLET | ORAL | Status: AC
  Filled 2015-07-29: qty 2

## 2015-07-29 MED ORDER — CEPHALEXIN 500 MG PO CAPS: 500.0000 mg | ORAL_CAPSULE | Freq: Two times a day (BID) | ORAL | Status: DC

## 2015-07-29 MED ORDER — METRONIDAZOLE 500 MG PO TABS
2000.0000 mg | ORAL_TABLET | Freq: Once | ORAL | Status: AC
  Administered 2015-07-29: 2000 mg via ORAL
  Filled 2015-07-29: qty 4

## 2015-07-29 MED ORDER — SODIUM CHLORIDE 0.9 % IV BOLUS (SEPSIS): 1000.0000 mL | Freq: Once | INTRAVENOUS | Status: DC

## 2015-07-29 MED ORDER — DIPHENHYDRAMINE HCL 50 MG/ML IJ SOLN: 25.0000 mg | Freq: Once | INTRAMUSCULAR | Status: DC

## 2015-07-29 MED ORDER — ONDANSETRON 4 MG PO TBDP
8.0000 mg | ORAL_TABLET | Freq: Once | ORAL | Status: AC
  Administered 2015-07-29: 8 mg via ORAL

## 2015-07-29 MED ORDER — PROMETHAZINE HCL 25 MG PO TABS: 25.0000 mg | ORAL_TABLET | Freq: Four times a day (QID) | ORAL | Status: DC | PRN

## 2015-07-29 MED ORDER — METOCLOPRAMIDE HCL 5 MG/ML IJ SOLN: 10.0000 mg | Freq: Once | INTRAMUSCULAR | Status: DC

## 2015-07-29 NOTE — ED Provider Notes (Signed)
CSN: [id number]     Arrival date & time 07/28/15  2329 History   This chart was scribed for Quintella Reichert, MD by Randa Evens, ED Scribe. This patient was seen in room A07C/A07C and the patient's care was started at 1:30 AM.    Chief Complaint  Patient presents with  . Emesis    Patient is a 39 y.o. female presenting with vomiting. The history is provided by the patient. No language interpreter was used.  Emesis Severity:  Mild Duration:  12 hours Timing:  Intermittent Number of daily episodes:  5 Progression:  Improving Chronicity:  New Relieved by:  None tried Worsened by:  Nothing tried Ineffective treatments:  None tried Associated symptoms: abdominal pain, diarrhea and headaches   Associated symptoms: no cough, no fever and no sore throat    HPI Comments: Victoria Moore is a 39 y.o. female who presents to the Emergency Department complaining of nausea and vomiting onset 12 hours prior. Pt states she has associated temporal HA, diarrhea and slight abdominal pain. Pt states that she does feel "drained." Pt doesn't report any medications PTA. Pt does report Hx of seizures that she is currently on Dilantin for. Denies fever, dysuria, cough, sore throat, ear pain, numbness or weakness.   Past Medical History  Diagnosis Date  . Seizures   . Anemia   . Sarcoidosis    Past Surgical History  Procedure Laterality Date  . Tubal ligation     Family History  Problem Relation Age of Onset  . Adopted: Yes  . Other Son     Growing pains   History  Substance Use Topics  . Smoking status: Former Smoker -- 0.20 packs/day for 17 years    Types: Cigarettes  . Smokeless tobacco: Not on file     Comment: Quit 2011  . Alcohol Use: No   OB History    Gravida Para Term Preterm AB TAB SAB Ectopic Multiple Living   1              Review of Systems  Constitutional: Negative for fever.  HENT: Negative for ear pain and sore throat.   Respiratory: Negative for cough.    Gastrointestinal: Positive for vomiting, abdominal pain and diarrhea.  Neurological: Positive for headaches. Negative for weakness and numbness.  All other systems reviewed and are negative.     Allergies  Aspirin  Home Medications   Prior to Admission medications   Medication Sig Start Date End Date Taking? Authorizing Provider  ferrous sulfate 325 (65 FE) MG tablet Take 1 tablet (325 mg total) by mouth 3 (three) times daily with meals. 11/13/14  Yes Reyne Dumas, MD  phenytoin (DILANTIN) 100 MG ER capsule Take 3 capsules (300 mg total) by mouth at bedtime. 05/12/15  Yes Merryl Hacker, MD  Vitamin D, Ergocalciferol, (DRISDOL) 50000 UNITS CAPS capsule Take 1 capsule (50,000 Units total) by mouth every 7 (seven) days. 05/22/15  Yes Donika K Patel, DO  cephALEXin (KEFLEX) 500 MG capsule Take 1 capsule (500 mg total) by mouth 2 (two) times daily. 07/29/15   Quintella Reichert, MD  pantoprazole (PROTONIX) 40 MG tablet Take 1 tablet (40 mg total) by mouth daily. Patient not taking: Reported on 07/29/2015 05/10/15   Lajean Saver, MD  promethazine (PHENERGAN) 25 MG tablet Take 1 tablet (25 mg total) by mouth every 6 (six) hours as needed for nausea or vomiting. 07/29/15   Quintella Reichert, MD   BP 154/86 mmHg  Pulse 51  Temp(Src)  99.2 F (37.3 C) (Oral)  Resp 16  Ht 6\' 1"  (1.854 m)  Wt 266 lb (120.657 kg)  BMI 35.10 kg/m2  SpO2 100%  LMP 07/27/2015   Physical Exam  Constitutional: She is oriented to person, place, and time. She appears well-developed and well-nourished.  HENT:  Head: Normocephalic and atraumatic.  Eyes: Pupils are equal, round, and reactive to light.  Cardiovascular: Normal rate and regular rhythm.   No murmur heard. Pulmonary/Chest: Effort normal and breath sounds normal. No respiratory distress.  Abdominal: Soft. There is no tenderness. There is no rebound and no guarding.  Musculoskeletal: She exhibits no edema or tenderness.  Neurological: She is alert and oriented to  person, place, and time.  Moves all extremities symmetrically.   Skin: Skin is warm and dry.  Psychiatric: She has a normal mood and affect. Her behavior is normal.  Nursing note and vitals reviewed.   ED Course  Procedures (including critical care time) DIAGNOSTIC STUDIES: Oxygen Saturation is 100% on RA, normal by my interpretation.    COORDINATION OF CARE: 1:45 AM-Discussed treatment plan with pt at bedside and pt agreed to plan.     Labs Review Labs Reviewed  CBC WITH DIFFERENTIAL/PLATELET - Abnormal; Notable for the following:    Hemoglobin 10.9 (*)    HCT 33.3 (*)    All other components within normal limits  COMPREHENSIVE METABOLIC PANEL - Abnormal; Notable for the following:    Glucose, Bld 117 (*)    Creatinine, Ser 1.06 (*)    Calcium 8.6 (*)    ALT 11 (*)    Total Bilirubin 0.2 (*)    All other components within normal limits  URINALYSIS, ROUTINE W REFLEX MICROSCOPIC (NOT AT [facility]) - Abnormal; Notable for the following:    APPearance CLOUDY (*)    Leukocytes, UA LARGE (*)    All other components within normal limits  URINE MICROSCOPIC-ADD ON - Abnormal; Notable for the following:    Squamous Epithelial / LPF FEW (*)    Bacteria, UA MANY (*)    All other components within normal limits  URINE CULTURE  POC URINE PREG, ED    Imaging Review No results found.   EKG Interpretation None      MDM   Final diagnoses:  Trichomonas infection  Acute UTI  Non-intractable vomiting with nausea, vomiting of unspecified type   Patient here for evaluation of dull headache, vomiting, diarrhea. UA is consistent with UTI.  In terms of headache, this is not consistent with subarachnoid hemorrhage or meningitis. No focal neurodeficits. Discussed with patient findings of Trichomonas in the for treatment. Also discussed UTI with home care return precautions.   I personally performed the services described in this documentation, which was scribed in my presence. The recorded  information has been reviewed and is accurate.      Quintella Reichert, MD 07/29/15 [phone]

## 2015-07-29 NOTE — ED Notes (Signed)
Patient left with all belongings and refused wheelchair. Discharge instruction reviewed and all questions were answered.

## 2015-07-29 NOTE — Discharge Instructions (Signed)
Nausea and Vomiting Nausea is a sick feeling that often comes before throwing up (vomiting). Vomiting is a reflex where stomach contents come out of your mouth. Vomiting can cause severe loss of body fluids (dehydration). Children and elderly adults can become dehydrated quickly, especially if they also have diarrhea. Nausea and vomiting are symptoms of a condition or disease. It is important to find the cause of your symptoms. CAUSES   Direct irritation of the stomach lining. This irritation can result from increased acid production (gastroesophageal reflux disease), infection, food poisoning, taking certain medicines (such as nonsteroidal anti-inflammatory drugs), alcohol use, or tobacco use.  Signals from the brain.These signals could be caused by a headache, heat exposure, an inner ear disturbance, increased pressure in the brain from injury, infection, a tumor, or a concussion, pain, emotional stimulus, or metabolic problems.  An obstruction in the gastrointestinal tract (bowel obstruction).  Illnesses such as diabetes, hepatitis, gallbladder problems, appendicitis, kidney problems, cancer, sepsis, atypical symptoms of a heart attack, or eating disorders.  Medical treatments such as chemotherapy and radiation.  Receiving medicine that makes you sleep (general anesthetic) during surgery. DIAGNOSIS Your caregiver may ask for tests to be done if the problems do not improve after a few days. Tests may also be done if symptoms are severe or if the reason for the nausea and vomiting is not clear. Tests may include:  Urine tests.  Blood tests.  Stool tests.  Cultures (to look for evidence of infection).  X-rays or other imaging studies. Test results can help your caregiver make decisions about treatment or the need for additional tests. TREATMENT You need to stay well hydrated. Drink frequently but in small amounts.You may wish to drink water, sports drinks, clear broth, or eat frozen  ice pops or gelatin dessert to help stay hydrated.When you eat, eating slowly may help prevent nausea.There are also some antinausea medicines that may help prevent nausea. HOME CARE INSTRUCTIONS   Take all medicine as directed by your caregiver.  If you do not have an appetite, do not force yourself to eat. However, you must continue to drink fluids.  If you have an appetite, eat a normal diet unless your caregiver tells you differently.  Eat a variety of complex carbohydrates (rice, wheat, potatoes, bread), lean meats, yogurt, fruits, and vegetables.  Avoid high-fat foods because they are more difficult to digest.  Drink enough water and fluids to keep your urine clear or pale yellow.  If you are dehydrated, ask your caregiver for specific rehydration instructions. Signs of dehydration may include:  Severe thirst.  Dry lips and mouth.  Dizziness.  Dark urine.  Decreasing urine frequency and amount.  Confusion.  Rapid breathing or pulse. SEEK IMMEDIATE MEDICAL CARE IF:   You have blood or brown flecks (like coffee grounds) in your vomit.  You have black or bloody stools.  You have a severe headache or stiff neck.  You are confused.  You have severe abdominal pain.  You have chest pain or trouble breathing.  You do not urinate at least once every 8 hours.  You develop cold or clammy skin.  You continue to vomit for longer than 24 to 48 hours.  You have a fever. MAKE SURE YOU:   Understand these instructions.  Will watch your condition.  Will get help right away if you are not doing well or get worse. Document Released: 12/13/2005 Document Revised: 03/06/2012 Document Reviewed: 05/12/2011 [employer] Patient Information 2015 [employer]. This information is not intended   to replace advice given to you by your health care provider. Make sure you discuss any questions you have with your health care provider.  Trichomoniasis Trichomoniasis is an infection  caused by an organism called Trichomonas. The infection can affect both women and men. In women, the outer female genitalia and the vagina are affected. In men, the penis is mainly affected, but the prostate and other reproductive organs can also be involved. Trichomoniasis is a sexually transmitted infection (STI) and is most often passed to another person through sexual contact.  RISK FACTORS  Having unprotected sexual intercourse.  Having sexual intercourse with an infected partner. SIGNS AND SYMPTOMS  Symptoms of trichomoniasis in women include:  Abnormal gray-green frothy vaginal discharge.  Itching and irritation of the vagina.  Itching and irritation of the area outside the vagina. Symptoms of trichomoniasis in men include:   Penile discharge with or without pain.  Pain during urination. This results from inflammation of the urethra. DIAGNOSIS  Trichomoniasis may be found during a Pap test or physical exam. Your health care provider may use one of the following methods to help diagnose this infection:  Examining vaginal discharge under a microscope. For men, urethral discharge would be examined.  Testing the pH of the vagina with a test tape.  Using a vaginal swab test that checks for the Trichomonas organism. A test is available that provides results within a few minutes.  Doing a culture test for the organism. This is not usually needed. TREATMENT   You may be given medicine to fight the infection. Women should inform their health care provider if they could be or are pregnant. Some medicines used to treat the infection should not be taken during pregnancy.  Your health care provider may recommend over-the-counter medicines or creams to decrease itching or irritation.  Your sexual partner will need to be treated if infected. HOME CARE INSTRUCTIONS   Take medicines only as directed by your health care provider.  Take over-the-counter medicine for itching or irritation  as directed by your health care provider.  Do not have sexual intercourse while you have the infection.  Women should not douche or wear tampons while they have the infection.  Discuss your infection with your partner. Your partner may have gotten the infection from you, or you may have gotten it from your partner.  Have your sex partner get examined and treated if necessary.  Practice safe, informed, and protected sex.  See your health care provider for other STI testing. SEEK MEDICAL CARE IF:   You still have symptoms after you finish your medicine.  You develop abdominal pain.  You have pain when you urinate.  You have bleeding after sexual intercourse.  You develop a rash.  Your medicine makes you sick or makes you throw up (vomit). MAKE SURE YOU:  Understand these instructions.  Will watch your condition.  Will get help right away if you are not doing well or get worse. Document Released: 06/08/2001 Document Revised: 04/29/2014 Document Reviewed: 09/24/2013 [facility] Patient Information 2015 [place], [place]. This information is not intended to replace advice given to you by your health care provider. Make sure you discuss any questions you have with your health care provider.  Urinary Tract Infection Urinary tract infections (UTIs) can develop anywhere along your urinary tract. Your urinary tract is your body's drainage system for removing wastes and extra water. Your urinary tract includes two kidneys, two ureters, a bladder, and a urethra. Your kidneys are a pair of  bean-shaped organs. Each kidney is about the size of your fist. They are located below your ribs, one on each side of your spine. CAUSES Infections are caused by microbes, which are microscopic organisms, including fungi, viruses, and bacteria. These organisms are so small that they can only be seen through a microscope. Bacteria are the microbes that most commonly cause UTIs. SYMPTOMS  Symptoms of UTIs may  vary by age and gender of the patient and by the location of the infection. Symptoms in young women typically include a frequent and intense urge to urinate and a painful, burning feeling in the bladder or urethra during urination. Older women and men are more likely to be tired, shaky, and weak and have muscle aches and abdominal pain. A fever may mean the infection is in your kidneys. Other symptoms of a kidney infection include pain in your back or sides below the ribs, nausea, and vomiting. DIAGNOSIS To diagnose a UTI, your caregiver will ask you about your symptoms. Your caregiver also will ask to provide a urine sample. The urine sample will be tested for bacteria and white blood cells. White blood cells are made by your body to help fight infection. TREATMENT  Typically, UTIs can be treated with medication. Because most UTIs are caused by a bacterial infection, they usually can be treated with the use of antibiotics. The choice of antibiotic and length of treatment depend on your symptoms and the type of bacteria causing your infection. HOME CARE INSTRUCTIONS  If you were prescribed antibiotics, take them exactly as your caregiver instructs you. Finish the medication even if you feel better after you have only taken some of the medication.  Drink enough water and fluids to keep your urine clear or pale yellow.  Avoid caffeine, tea, and carbonated beverages. They tend to irritate your bladder.  Empty your bladder often. Avoid holding urine for long periods of time.  Empty your bladder before and after sexual intercourse.  After a bowel movement, women should cleanse from front to back. Use each tissue only once. SEEK MEDICAL CARE IF:   You have back pain.  You develop a fever.  Your symptoms do not begin to resolve within 3 days. SEEK IMMEDIATE MEDICAL CARE IF:   You have severe back pain or lower abdominal pain.  You develop chills.  You have nausea or vomiting.  You have  continued burning or discomfort with urination. MAKE SURE YOU:   Understand these instructions.  Will watch your condition.  Will get help right away if you are not doing well or get worse. Document Released: 09/22/2005 Document Revised: 06/13/2012 Document Reviewed: 01/21/2012 [facility] Patient Information 2015 [place], [place]. This information is not intended to replace advice given to you by your health care provider. Make sure you discuss any questions you have with your health care provider.

## 2015-07-30 LAB — URINE CULTURE

## 2015-08-17 ENCOUNTER — Encounter (HOSPITAL_BASED_OUTPATIENT_CLINIC_OR_DEPARTMENT_OTHER): Payer: 59

## 2015-09-22 ENCOUNTER — Emergency Department (HOSPITAL_COMMUNITY): Payer: 59

## 2015-09-22 ENCOUNTER — Emergency Department (HOSPITAL_COMMUNITY)
Admission: EM | Admit: 2015-09-22 | Discharge: 2015-09-23 | Disposition: A | Discharge status: Home / Self Care | Payer: 59 | Attending: Emergency Medicine | Admitting: Emergency Medicine

## 2015-09-22 ENCOUNTER — Encounter (HOSPITAL_COMMUNITY): Payer: Self-pay | Admitting: Family Medicine

## 2015-09-22 DIAGNOSIS — N938 Other specified abnormal uterine and vaginal bleeding: Secondary | ICD-10-CM | POA: Insufficient documentation

## 2015-09-22 DIAGNOSIS — Z792 Long term (current) use of antibiotics: Secondary | ICD-10-CM | POA: Insufficient documentation

## 2015-09-22 DIAGNOSIS — Z79899 Other long term (current) drug therapy: Secondary | ICD-10-CM | POA: Insufficient documentation

## 2015-09-22 DIAGNOSIS — G40909 Epilepsy, unspecified, not intractable, without status epilepticus: Secondary | ICD-10-CM | POA: Insufficient documentation

## 2015-09-22 DIAGNOSIS — D649 Anemia, unspecified: Secondary | ICD-10-CM | POA: Insufficient documentation

## 2015-09-22 DIAGNOSIS — N939 Abnormal uterine and vaginal bleeding, unspecified: Secondary | ICD-10-CM

## 2015-09-22 DIAGNOSIS — Z87891 Personal history of nicotine dependence: Secondary | ICD-10-CM | POA: Insufficient documentation

## 2015-09-22 DIAGNOSIS — M545 Low back pain: Secondary | ICD-10-CM | POA: Insufficient documentation

## 2015-09-22 DIAGNOSIS — Z3202 Encounter for pregnancy test, result negative: Secondary | ICD-10-CM | POA: Insufficient documentation

## 2015-09-22 LAB — CBC WITH DIFFERENTIAL/PLATELET
Basophils Absolute: 0 10*3/uL (ref 0.0–0.1)
Basophils Relative: 1 %
Eosinophils Absolute: 0.1 10*3/uL (ref 0.0–0.7)
Eosinophils Relative: 3 %
HCT: 31 % — ABNORMAL LOW (ref 36.0–46.0)
Hemoglobin: 9.6 g/dL — ABNORMAL LOW (ref 12.0–15.0)
Lymphocytes Relative: 36 %
Lymphs Abs: 1.6 10*3/uL (ref 0.7–4.0)
MCH: 26.2 pg (ref 26.0–34.0)
MCHC: 31 g/dL (ref 30.0–36.0)
MCV: 84.5 fL (ref 78.0–100.0)
Monocytes Absolute: 0.3 10*3/uL (ref 0.1–1.0)
Monocytes Relative: 6 %
Neutro Abs: 2.4 10*3/uL (ref 1.7–7.7)
Neutrophils Relative %: 54 %
Platelets: 193 10*3/uL (ref 150–400)
RBC: 3.67 MIL/uL — ABNORMAL LOW (ref 3.87–5.11)
RDW: 12.9 % (ref 11.5–15.5)
WBC: 4.3 10*3/uL (ref 4.0–10.5)

## 2015-09-22 LAB — COMPREHENSIVE METABOLIC PANEL
ALT: 10 U/L — ABNORMAL LOW (ref 14–54)
AST: 18 U/L (ref 15–41)
Albumin: 3.6 g/dL (ref 3.5–5.0)
Alkaline Phosphatase: 45 U/L (ref 38–126)
Anion gap: 7 (ref 5–15)
BUN: 9 mg/dL (ref 6–20)
CO2: 26 mmol/L (ref 22–32)
Calcium: 8.9 mg/dL (ref 8.9–10.3)
Chloride: 108 mmol/L (ref 101–111)
Creatinine, Ser: 1.06 mg/dL — ABNORMAL HIGH (ref 0.44–1.00)
GFR calc Af Amer: >60 mL/min (ref >60)
GFR calc non Af Amer: >60 mL/min (ref >60)
Glucose, Bld: 107 mg/dL — ABNORMAL HIGH (ref 65–99)
Potassium: 3.7 mmol/L (ref 3.5–5.1)
Sodium: 141 mmol/L (ref 135–145)
Total Bilirubin: 0.5 mg/dL (ref 0.3–1.2)
Total Protein: 6.7 g/dL (ref 6.5–8.1)

## 2015-09-22 LAB — WET PREP, GENITAL
Clue Cells Wet Prep HPF POC: NONE SEEN
Trich, Wet Prep: NONE SEEN
Yeast Wet Prep HPF POC: NONE SEEN

## 2015-09-22 LAB — I-STAT BETA HCG BLOOD, ED (MC, WL, AP ONLY): I-stat hCG, quantitative: <5 m[IU]/mL (ref <5)

## 2015-09-22 LAB — CBC
HCT: 30 % — ABNORMAL LOW (ref 36.0–46.0)
Hemoglobin: 9.5 g/dL — ABNORMAL LOW (ref 12.0–15.0)
MCH: 26.8 pg (ref 26.0–34.0)
MCHC: 31.7 g/dL (ref 30.0–36.0)
MCV: 84.5 fL (ref 78.0–100.0)
Platelets: 171 10*3/uL (ref 150–400)
RBC: 3.55 MIL/uL — ABNORMAL LOW (ref 3.87–5.11)
RDW: 12.9 % (ref 11.5–15.5)
WBC: 4.6 10*3/uL (ref 4.0–10.5)

## 2015-09-22 LAB — PROTIME-INR
INR: 1.08 (ref 0.00–1.49)
Prothrombin Time: 14.2 seconds (ref 11.6–15.2)

## 2015-09-22 MED ORDER — KETOROLAC TROMETHAMINE 30 MG/ML IJ SOLN
30.0000 mg | Freq: Once | INTRAMUSCULAR | Status: AC
  Administered 2015-09-22: 30 mg via INTRAVENOUS
  Filled 2015-09-22: qty 1

## 2015-09-22 MED ORDER — MORPHINE SULFATE (PF) 4 MG/ML IV SOLN
4.0000 mg | Freq: Once | INTRAVENOUS | Status: AC
  Administered 2015-09-22: 4 mg via INTRAVENOUS
  Filled 2015-09-22: qty 1

## 2015-09-22 MED ORDER — OXYCODONE-ACETAMINOPHEN 5-325 MG PO TABS
1.0000 | ORAL_TABLET | ORAL | Status: DC | PRN
Start: 2015-09-22 — End: 2016-03-12

## 2015-09-22 MED ORDER — MEGESTROL ACETATE 40 MG PO TABS
80.0000 mg | ORAL_TABLET | Freq: Once | ORAL | Status: AC
  Administered 2015-09-22: 80 mg via ORAL
  Filled 2015-09-22: qty 2

## 2015-09-22 MED ORDER — MEGESTROL ACETATE 40 MG PO TABS: 80.0000 mg | ORAL_TABLET | Freq: Two times a day (BID) | ORAL | Status: DC

## 2015-09-22 MED ORDER — OXYCODONE-ACETAMINOPHEN 5-325 MG PO TABS
1.0000 | ORAL_TABLET | Freq: Once | ORAL | Status: AC
  Administered 2015-09-22: 1 via ORAL
  Filled 2015-09-22: qty 1

## 2015-09-22 NOTE — ED Provider Notes (Signed)
CSN: [id number]     Arrival date & time 09/22/15  1711 History   First MD Initiated Contact with Patient 09/22/15 1819     Chief Complaint  Patient presents with  . Vaginal Bleeding   Victoria Moore is a 39 y.o. female with a past medical history significant for sarcoidosis, seizures, chronic anemia, and history of heavy menstrual periods who presents with vaginal bleeding. The patient reports that she is currently on her menstrual cycle and the amount of bleeding increased today. The patient says that she has gone through approximately 20 pads today with significant amount of clots. The patient says that she is having some low back pain in her left back which she has had in the past. The patient denies any nausea, vomiting, headaches, vision changes, chest pain, shortness of breath, lightheadedness or syncope. The patient denies any constipation, diarrhea, or dysuria. The patient's primary concern is her vaginal bleeding which is more than normal for her.   (Consider location/radiation/quality/duration/timing/severity/associated sxs/prior Treatment) Patient is a 39 y.o. female presenting with vaginal bleeding. The history is provided by the patient and medical records. No language interpreter was used.  Vaginal Bleeding Quality:  Clots and heavier than menses Severity:  Severe Onset quality:  Gradual Duration:  3 days Timing:  Constant Progression:  Unchanged Chronicity:  Recurrent Menstrual history:  Regular Number of pads used:  20 Relieved by:  Nothing Worsened by:  Nothing tried Ineffective treatments:  None tried Associated symptoms: back pain   Associated symptoms: no abdominal pain, no dizziness, no dysuria, no fatigue, no fever, no nausea and no vaginal discharge   Risk factors: no bleeding disorder and no gynecological surgery     Past Medical History  Diagnosis Date  . Seizures   . Anemia   . Sarcoidosis    Past Surgical History  Procedure Laterality Date  . Tubal  ligation     Family History  Problem Relation Age of Onset  . Adopted: Yes  . Other Son     Growing pains   Social History  Substance Use Topics  . Smoking status: Former Smoker -- 0.20 packs/day for 17 years    Types: Cigarettes  . Smokeless tobacco: None     Comment: Quit 2011  . Alcohol Use: No   OB History    Gravida Para Term Preterm AB TAB SAB Ectopic Multiple Living   1              Review of Systems  Constitutional: Negative for fever, chills, diaphoresis and fatigue.  HENT: Negative for congestion and rhinorrhea.   Eyes: Negative for visual disturbance.  Respiratory: Negative for chest tightness, shortness of breath and wheezing.   Cardiovascular: Negative for chest pain and palpitations.  Gastrointestinal: Negative for nausea and abdominal pain.  Genitourinary: Positive for vaginal bleeding. Negative for dysuria, frequency, flank pain, vaginal discharge and vaginal pain.  Musculoskeletal: Positive for back pain. Negative for neck pain and neck stiffness.  Skin: Negative for rash and wound.  Neurological: Negative for dizziness, weakness and headaches.  Psychiatric/Behavioral: Negative for confusion.  All other systems reviewed and are negative.     Allergies  Aspirin  Home Medications   Prior to Admission medications   Medication Sig Start Date End Date Taking? Authorizing Provider  cephALEXin (KEFLEX) 500 MG capsule Take 1 capsule (500 mg total) by mouth 2 (two) times daily. 07/29/15   Quintella Reichert, MD  ferrous sulfate 325 (65 FE) MG tablet Take 1 tablet (325 mg  total) by mouth 3 (three) times daily with meals. 11/13/14   Reyne Dumas, MD  pantoprazole (PROTONIX) 40 MG tablet Take 1 tablet (40 mg total) by mouth daily. Patient not taking: Reported on 07/29/2015 05/10/15   Lajean Saver, MD  phenytoin (DILANTIN) 100 MG ER capsule Take 3 capsules (300 mg total) by mouth at bedtime. 05/12/15   Merryl Hacker, MD  promethazine (PHENERGAN) 25 MG tablet Take 1  tablet (25 mg total) by mouth every 6 (six) hours as needed for nausea or vomiting. 07/29/15   Quintella Reichert, MD  Vitamin D, Ergocalciferol, (DRISDOL) 50000 UNITS CAPS capsule Take 1 capsule (50,000 Units total) by mouth every 7 (seven) days. 05/22/15   Donika K Patel, DO   BP 108/68 mmHg  Pulse 64  Temp(Src) 98.2 F (36.8 C) (Oral)  Resp 16  Ht 6\' 1"  (1.854 m)  Wt 264 lb 8 oz (119.976 kg)  BMI 34.90 kg/m2  SpO2 100%  LMP 09/17/2015 Physical Exam  Constitutional: She is oriented to person, place, and time. She appears well-developed and well-nourished.  HENT:  Head: Atraumatic.  Eyes: Conjunctivae are normal. Pupils are equal, round, and reactive to light. No scleral icterus.  Neck: Normal range of motion.  Cardiovascular: Normal rate, regular rhythm and intact distal pulses.   No murmur heard. Pulmonary/Chest: Effort normal. No stridor. She has no rales. She exhibits no tenderness.  Abdominal: There is no tenderness. There is no rebound.  Genitourinary: Uterus is tender. Cervix exhibits discharge. Cervix exhibits no motion tenderness and no friability. There is bleeding in the vagina. No tenderness in the vagina. No foreign body around the vagina. No vaginal discharge found.  Musculoskeletal: She exhibits no tenderness.  Neurological: She is alert and oriented to person, place, and time. She displays normal reflexes. She exhibits normal muscle tone.  Skin: Skin is warm. She is not diaphoretic. No pallor.  Psychiatric: She has a normal mood and affect.  Nursing note and vitals reviewed.   ED Course  Procedures (including critical care time) Labs Review Labs Reviewed  WET PREP, GENITAL - Abnormal; Notable for the following:    WBC, Wet Prep HPF POC FEW (*)    All other components within normal limits  CBC WITH DIFFERENTIAL/PLATELET - Abnormal; Notable for the following:    RBC 3.67 (*)    Hemoglobin 9.6 (*)    HCT 31.0 (*)    All other components within normal limits   COMPREHENSIVE METABOLIC PANEL - Abnormal; Notable for the following:    Glucose, Bld 107 (*)    Creatinine, Ser 1.06 (*)    ALT 10 (*)    All other components within normal limits  CBC - Abnormal; Notable for the following:    RBC 3.55 (*)    Hemoglobin 9.5 (*)    HCT 30.0 (*)    All other components within normal limits  PROTIME-INR  I-STAT BETA HCG BLOOD, ED ([facility], [facility], [facility] ONLY)  GC/CHLAMYDIA PROBE AMP ([place]) NOT AT [facility]  WET PREP  (BD AFFIRM) ([place])    Imaging Review US Transvaginal Non-ob  09/22/2015   CLINICAL DATA:  Vaginal bleeding and passing clots today.  EXAM: TRANSABDOMINAL AND TRANSVAGINAL ULTRASOUND OF PELVIS  TECHNIQUE: Both transabdominal and transvaginal ultrasound examinations of the pelvis were performed. Transabdominal technique was performed for global imaging of the pelvis including uterus, ovaries, adnexal regions, and pelvic cul-de-sac. It was necessary to proceed with endovaginal exam following the transabdominal exam to visualize the bilateral ovaries  and endometrium.  COMPARISON:  None  FINDINGS: Uterus  Measurements: 8.7 x 5.1 x 6.4 cm. There is a 2.7 x 2.4 x 2.4 cm fibroid.  Endometrium  Thickness: 12.3 mm.  No focal abnormality visualized.  Right ovary  Measurements: 3 x 2 x 2.8 cm. Normal appearance/no adnexal mass.  Left ovary  Measurements: 2.5 x 1.4 x 2.8 cm. Normal appearance/no adnexal mass.  Other findings  No free fluid.  IMPRESSION: 2.7 cm uterine fibroid.  Otherwise no abnormality identified.   Electronically Signed   By: Abelardo Diesel M.D.   On: 09/22/2015 20:34   US Pelvis Complete  09/22/2015   CLINICAL DATA:  Vaginal bleeding and passing clots today.  EXAM: TRANSABDOMINAL AND TRANSVAGINAL ULTRASOUND OF PELVIS  TECHNIQUE: Both transabdominal and transvaginal ultrasound examinations of the pelvis were performed. Transabdominal technique was performed for global imaging of the pelvis including uterus, ovaries, adnexal regions, and pelvic  cul-de-sac. It was necessary to proceed with endovaginal exam following the transabdominal exam to visualize the bilateral ovaries and endometrium.  COMPARISON:  None  FINDINGS: Uterus  Measurements: 8.7 x 5.1 x 6.4 cm. There is a 2.7 x 2.4 x 2.4 cm fibroid.  Endometrium  Thickness: 12.3 mm.  No focal abnormality visualized.  Right ovary  Measurements: 3 x 2 x 2.8 cm. Normal appearance/no adnexal mass.  Left ovary  Measurements: 2.5 x 1.4 x 2.8 cm. Normal appearance/no adnexal mass.  Other findings  No free fluid.  IMPRESSION: 2.7 cm uterine fibroid.  Otherwise no abnormality identified.   Electronically Signed   By: Abelardo Diesel M.D.   On: 09/22/2015 20:34   I have personally reviewed and evaluated these images and lab results as part of my medical decision-making.   EKG Interpretation None      MDM   Charlina Dwight is a 39 y.o. female with a past medical history significant for sarcoidosis, seizures, chronic anemia, and history of heavy menstrual periods who presents with vaginal bleeding. Based on the patient's report, suspect more severe than usual menstrual bleeding. The patient reports that she is not having lightheadedness,dizziness, feeling of heart racing, or other signs or symptoms concerning for severe anemia. The patient's vital signs on arrival did not reveal hypotension, tachycardia or tachypnea.  Pelvic exam was performed which revealed blood clots and oozing bleeding from the cervical os. The patient did not have evidence of arterial bleeding. The patient's uterus was mildly tender but there was no cervical motion tenderness, no adnexal tenderness, and no evidence of cervical discharge or lacerations.  The patient had diagnostic laboratory studies performed which revealed a stable anemia. The patient reports that she had to be transfused when her hemoglobin was as low as 6 last year. The hemoglobin was trended and remained stable. The patient did not have evidence of a coagulation  disorder with her normal INR and a normal platelet count.  Given the patient's vaginal  bleeding, a pelvic ultrasound was ordered. The results showed evidence of uterine fibroids as the likely source of her bleeding. Next  The OB/GYN team was called and they recommended starting the patient on Megace and they will follow up with her in clinic in the next several days. The patient was given a pressure for pain medicine after her pain improved with narcotic administration. The patient understood the plan of care and understood return precautions for signs and symptoms of worsening anemia, infection, or new symptoms. The patient and her family had no other questions, concerns, or complaints  and the patient was discharged in good condition.  This patient was seen with Dr. Oleta Mouse, emergency medicine attending.   Final diagnoses:  Vaginal bleeding        Antony Blackbird, MD 09/22/15 Fountain Liu, MD 09/23/15 [phone]

## 2015-09-22 NOTE — ED Notes (Signed)
Pt here for large clots and vaginal bleeding. sts she just got off her period. sts back pain. sts weak

## 2015-09-22 NOTE — ED Notes (Signed)
Patient transported to US 

## 2015-09-23 LAB — GC/CHLAMYDIA PROBE AMP (~~LOC~~) NOT AT ARMC
Chlamydia: NEGATIVE
Neisseria Gonorrhea: NEGATIVE

## 2015-09-23 NOTE — Discharge Instructions (Signed)
Abnormal Uterine Bleeding Abnormal uterine bleeding means bleeding from the vagina that is not your normal menstrual period. This can be:  Bleeding or spotting between periods.  Bleeding after sex (sexual intercourse).  Bleeding that is heavier or more than normal.  Periods that last longer than usual.  Bleeding after menopause. There are many problems that may cause this. Treatment will depend on the cause of the bleeding. Any kind of bleeding that is not normal should be reviewed by your doctor.  HOME CARE Watch your condition for any changes. These actions may lessen any discomfort you are having:  Do not use tampons or douches as told by your doctor.  Change your pads often. You should get regular pelvic exams and Pap tests. Keep all appointments for tests as told by your doctor. GET HELP IF:  You are bleeding for more than 1 week.  You feel dizzy at times. GET HELP RIGHT AWAY IF:   You pass out.  You have to change pads every 15 to 30 minutes.  You have belly pain.  You have a fever.  You become sweaty or weak.  You are passing large blood clots from the vagina.  You feel sick to your stomach (nauseous) and throw up (vomit). MAKE SURE YOU:  Understand these instructions.  Will watch your condition.  Will get help right away if you are not doing well or get worse. Document Released: 10/10/2009 Document Revised: 12/18/2013 Document Reviewed: 07/12/2013 [facility] Patient Information 2015 [place], [place]. This information is not intended to replace advice given to you by your health care provider. Make sure you discuss any questions you have with your health care provider.     Uterine Fibroid A uterine fibroid is a growth (tumor) that occurs in your uterus. This type of tumor is not cancerous and does not spread out of the uterus. You can have one or many fibroids. Fibroids can vary in size, weight, and where they grow in the uterus. Some can become quite large.  Most fibroids do not require medical treatment, but some can cause pain or heavy bleeding during and between periods. CAUSES  A fibroid is the result of a single uterine cell that keeps growing (unregulated), which is different than most cells in the human body. Most cells have a control mechanism that keeps them from reproducing without control.  SIGNS AND SYMPTOMS   Bleeding.  Pelvic pain and pressure.  Bladder problems due to the size of the fibroid.  Infertility and miscarriages depending on the size and location of the fibroid. DIAGNOSIS  Uterine fibroids are diagnosed through a physical exam. Your health care provider may feel the lumpy tumors during a pelvic exam. Ultrasonography may be done to get information regarding size, location, and number of tumors.  TREATMENT   Your health care provider may recommend watchful waiting. This involves getting the fibroid checked by your health care provider to see if it grows or shrinks.   Hormone treatment or an intrauterine device (IUD) may be prescribed.   Surgery may be needed to remove the fibroids (myomectomy) or the uterus (hysterectomy). This depends on your situation. When fibroids interfere with fertility and a woman wants to become pregnant, a health care provider may recommend having the fibroids removed.  [place] care depends on how you were treated. In general:   Keep all follow-up appointments with your health care provider.   Only take over-the-counter or prescription medicines as directed by your health care provider.  If you were prescribed a hormone treatment, take the hormone medicines exactly as directed. Do not take aspirin. It can cause bleeding.   Talk to your health care provider about taking iron pills.  If your periods are troublesome but not so heavy, lie down with your feet raised slightly above your heart. Place cold packs on your lower abdomen.   If your periods are heavy, write  down the number of pads or tampons you use per month. Bring this information to your health care provider.   Include green vegetables in your diet.  SEEK IMMEDIATE MEDICAL CARE IF:  You have pelvic pain or cramps not controlled with medicines.   You have a sudden increase in pelvic pain.   You have an increase in bleeding between and during periods.   You have excessive periods and soak tampons or pads in a half hour or less.  You feel lightheaded or have fainting episodes. Document Released: 12/10/2000 Document Revised: 10/03/2013 Document Reviewed: 07/12/2013 [facility] Patient Information 2015 [place], [place]. This information is not intended to replace advice given to you by your health care provider. Make sure you discuss any questions you have with your health care provider.

## 2015-10-15 ENCOUNTER — Encounter: Payer: 59 | Admitting: Family Medicine

## 2015-11-22 ENCOUNTER — Emergency Department (HOSPITAL_COMMUNITY)
Admission: EM | Admit: 2015-11-22 | Discharge: 2015-11-22 | Disposition: A | Discharge status: Home / Self Care | Payer: Self-pay | Attending: Emergency Medicine | Admitting: Emergency Medicine

## 2015-11-22 ENCOUNTER — Emergency Department (HOSPITAL_COMMUNITY): Payer: 59

## 2015-11-22 ENCOUNTER — Encounter (HOSPITAL_COMMUNITY): Payer: Self-pay | Admitting: Nurse Practitioner

## 2015-11-22 DIAGNOSIS — Z3202 Encounter for pregnancy test, result negative: Secondary | ICD-10-CM | POA: Insufficient documentation

## 2015-11-22 DIAGNOSIS — R52 Pain, unspecified: Secondary | ICD-10-CM

## 2015-11-22 DIAGNOSIS — Z87891 Personal history of nicotine dependence: Secondary | ICD-10-CM | POA: Insufficient documentation

## 2015-11-22 DIAGNOSIS — Z79899 Other long term (current) drug therapy: Secondary | ICD-10-CM | POA: Insufficient documentation

## 2015-11-22 DIAGNOSIS — D500 Iron deficiency anemia secondary to blood loss (chronic): Secondary | ICD-10-CM

## 2015-11-22 DIAGNOSIS — M25562 Pain in left knee: Secondary | ICD-10-CM | POA: Insufficient documentation

## 2015-11-22 DIAGNOSIS — N83202 Unspecified ovarian cyst, left side: Secondary | ICD-10-CM

## 2015-11-22 DIAGNOSIS — D509 Iron deficiency anemia, unspecified: Secondary | ICD-10-CM | POA: Insufficient documentation

## 2015-11-22 DIAGNOSIS — N8302 Follicular cyst of left ovary: Secondary | ICD-10-CM | POA: Insufficient documentation

## 2015-11-22 LAB — COMPREHENSIVE METABOLIC PANEL
ALT: 11 U/L — ABNORMAL LOW (ref 14–54)
AST: 16 U/L (ref 15–41)
Albumin: 3.6 g/dL (ref 3.5–5.0)
Alkaline Phosphatase: 52 U/L (ref 38–126)
Anion gap: 7 (ref 5–15)
BUN: 7 mg/dL (ref 6–20)
CO2: 27 mmol/L (ref 22–32)
Calcium: 9 mg/dL (ref 8.9–10.3)
Chloride: 104 mmol/L (ref 101–111)
Creatinine, Ser: 1 mg/dL (ref 0.44–1.00)
GFR calc Af Amer: >60 mL/min (ref >60)
GFR calc non Af Amer: >60 mL/min (ref >60)
Glucose, Bld: 94 mg/dL (ref 65–99)
Potassium: 3.5 mmol/L (ref 3.5–5.1)
Sodium: 138 mmol/L (ref 135–145)
Total Bilirubin: 0.5 mg/dL (ref 0.3–1.2)
Total Protein: 6.7 g/dL (ref 6.5–8.1)

## 2015-11-22 LAB — URINE MICROSCOPIC-ADD ON: RBC / HPF: NONE SEEN RBC/hpf (ref 0–5)

## 2015-11-22 LAB — CBC
HCT: 28.8 % — ABNORMAL LOW (ref 36.0–46.0)
Hemoglobin: 8.7 g/dL — ABNORMAL LOW (ref 12.0–15.0)
MCH: 23.3 pg — ABNORMAL LOW (ref 26.0–34.0)
MCHC: 30.2 g/dL (ref 30.0–36.0)
MCV: 77 fL — ABNORMAL LOW (ref 78.0–100.0)
Platelets: 205 10*3/uL (ref 150–400)
RBC: 3.74 MIL/uL — ABNORMAL LOW (ref 3.87–5.11)
RDW: 14.5 % (ref 11.5–15.5)
WBC: 4.6 10*3/uL (ref 4.0–10.5)

## 2015-11-22 LAB — URINALYSIS, ROUTINE W REFLEX MICROSCOPIC
Bilirubin Urine: NEGATIVE
Glucose, UA: NEGATIVE mg/dL
Hgb urine dipstick: NEGATIVE
Ketones, ur: NEGATIVE mg/dL
Nitrite: NEGATIVE
Protein, ur: NEGATIVE mg/dL
Specific Gravity, Urine: 1.02 (ref 1.005–1.030)
pH: 6 (ref 5.0–8.0)

## 2015-11-22 LAB — I-STAT BETA HCG BLOOD, ED (MC, WL, AP ONLY): I-stat hCG, quantitative: <5 m[IU]/mL (ref <5)

## 2015-11-22 MED ORDER — HYDROMORPHONE HCL 1 MG/ML IJ SOLN
1.0000 mg | Freq: Once | INTRAMUSCULAR | Status: AC
  Administered 2015-11-22: 1 mg via INTRAVENOUS
  Filled 2015-11-22: qty 1

## 2015-11-22 MED ORDER — SODIUM CHLORIDE 0.9 % IV BOLUS (SEPSIS)
1000.0000 mL | Freq: Once | INTRAVENOUS | Status: AC
  Administered 2015-11-22: 1000 mL via INTRAVENOUS

## 2015-11-22 MED ORDER — ONDANSETRON HCL 4 MG/2ML IJ SOLN
4.0000 mg | Freq: Once | INTRAMUSCULAR | Status: AC
  Administered 2015-11-22: 4 mg via INTRAVENOUS
  Filled 2015-11-22: qty 2

## 2015-11-22 MED ORDER — FERROUS SULFATE 325 (65 FE) MG PO TABS: 325.0000 mg | ORAL_TABLET | Freq: Three times a day (TID) | ORAL | Status: DC

## 2015-11-22 MED ORDER — HYDROCODONE-ACETAMINOPHEN 5-325 MG PO TABS
1.0000 | ORAL_TABLET | Freq: Four times a day (QID) | ORAL | Status: DC | PRN
Start: 2015-11-22 — End: 2016-03-12

## 2015-11-22 NOTE — ED Notes (Signed)
She c/o bilateral lower abd/flank pain, nausuea, diarrhea, decreased appetite since this am. She was diagnosed with fibroids here recently and has had vaginal bleeding since. She stopped bleeding this am before the pain started. She was not having pain while she was bleeding. She also c/o L knee pain and swelling over past several days, "feels like fluid on my knee."

## 2015-11-22 NOTE — ED Provider Notes (Addendum)
CSN: [id number]     Arrival date & time 11/22/15  1818 History   First MD Initiated Contact with Patient 11/22/15 1908     Chief Complaint  Patient presents with  . Abdominal Pain  . Knee Pain     (Consider location/radiation/quality/duration/timing/severity/associated sxs/prior Treatment) HPI Comments: Patient is a 39 year old female with a history of fibroids on iron with ongoing bleeding, anemia and sarcoidosis presents today with severe abdominal pain since 11 AM. Patient states she's had intermittent heavy vaginal bleeding over the last 3 months but she has not seen a GYN because her insurance does not start until January 1. She has had no vaginal bleeding today however at 11 AM she bent over to pick something up when she stood up she developed severe abdominal pain that she rates as a 10 out of 10. It sharp in nature and radiates into the back. She has had a few episodes of loose stool but denies any dysuria, frequency or urgency. She has had nausea without vomiting. No prior history of symptoms similar to this. She has had no prior surgeries.  Patient is a 39 y.o. female presenting with abdominal pain and knee pain. The history is provided by the patient.  Abdominal Pain Knee Pain   Past Medical History  Diagnosis Date  . Seizures ([place])   . Anemia   . Sarcoidosis [facility])    Past Surgical History  Procedure Laterality Date  . Tubal ligation     Family History  Problem Relation Age of Onset  . Adopted: Yes  . Other Son     Growing pains   Social History  Substance Use Topics  . Smoking status: Former Smoker -- 0.20 packs/day for 17 years    Types: Cigarettes  . Smokeless tobacco: None     Comment: Quit 2011  . Alcohol Use: No   OB History    Gravida Para Term Preterm AB TAB SAB Ectopic Multiple Living   1              Review of Systems  Gastrointestinal: Positive for abdominal pain.  Musculoskeletal:       3 days of left knee pain and swelling. 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  All other systems reviewed and are negative.     Allergies  Aspirin  Home Medications   Prior to Admission medications   Medication Sig Start Date End Date Taking? Authorizing Provider  cephALEXin (KEFLEX) 500 MG capsule Take 1 capsule (500 mg total) by mouth 2 (two) times daily. Patient not taking: Reported on 09/22/2015 07/29/15   Quintella Reichert, MD  ferrous sulfate 325 (65 FE) MG tablet Take 1 tablet (325 mg total) by mouth 3 (three) times daily with meals. 11/13/14   Reyne Dumas, MD  megestrol (MEGACE) 40 MG tablet Take 2 tablets (80 mg total) by mouth 2 (two) times daily. 09/22/15   Antony Blackbird, MD  oxyCODONE-acetaminophen (PERCOCET/ROXICET) 5-325 MG per tablet Take 1 tablet by mouth every 4 (four) hours as needed for severe pain. 09/22/15   Antony Blackbird, MD  pantoprazole (PROTONIX) 40 MG tablet Take 1 tablet (40 mg total) by mouth daily. Patient not taking: Reported on 07/29/2015 05/10/15   Lajean Saver, MD  phenytoin (DILANTIN) 100 MG ER capsule Take 3 capsules (300 mg total) by mouth at bedtime. Patient not taking: Reported on 09/22/2015 05/12/15   Merryl Hacker, MD  promethazine (PHENERGAN) 25 MG tablet Take 1 tablet (25 mg total) by mouth every 6 (six) hours as needed for nausea or vomiting. Patient not taking: Reported on 09/22/2015 07/29/15   Quintella Reichert, MD  Vitamin D, Ergocalciferol, (DRISDOL) 50000 UNITS CAPS capsule Take 1 capsule (50,000 Units total) by mouth every 7 (seven) days. Patient not taking: Reported on 09/22/2015 05/22/15   Donika K Patel, DO   BP 106/84 mmHg  Pulse 90  Temp(Src) 99.1 F (37.3 C) (Oral)  Resp 17  SpO2 100% Physical Exam  Constitutional: She is oriented to person, place, and time. She appears well-developed and well-nourished. No distress.  HENT:  Head: Normocephalic and atraumatic.  Mouth/Throat: Oropharynx is clear and moist.  Eyes: EOM are normal. Pupils are  equal, round, and reactive to light.  Pale conjunctiva  Neck: Normal range of motion. Neck supple.  Cardiovascular: Normal rate, regular rhythm and intact distal pulses.   No murmur heard. Pulmonary/Chest: Effort normal and breath sounds normal. No respiratory distress. She has no wheezes. She has no rales.  Abdominal: Soft. Normal appearance. She exhibits no distension. There is tenderness in the right lower quadrant and suprapubic area. There is CVA tenderness. There is no rebound and no guarding.  Right CVA tenderness  Genitourinary: Cervix exhibits no motion tenderness and no discharge. Right adnexum displays tenderness. Right adnexum displays no mass and no fullness. Left adnexum displays tenderness. Left adnexum displays no mass and no fullness. No vaginal discharge found.  More pronounced pain in the right adnexal  Musculoskeletal: Normal range of motion. She exhibits tenderness. She exhibits no edema.       Left knee: She exhibits swelling and effusion. She exhibits normal range of motion. Tenderness found. Patellar tendon tenderness noted.  Neurological: She is alert and oriented to person, place, and time.  Skin: Skin is warm and dry. No rash noted. No erythema.  Psychiatric: She has a normal mood and affect. Her behavior is normal.  Nursing note and vitals reviewed.   ED Course  Procedures (including critical care time) Labs Review Labs Reviewed  COMPREHENSIVE METABOLIC PANEL - Abnormal; Notable for the following:    ALT 11 (*)    All other components within normal limits  CBC - Abnormal; Notable for the following:    RBC 3.74 (*)    Hemoglobin 8.7 (*)    HCT 28.8 (*)    MCV 77.0 (*)    MCH 23.3 (*)    All other components within normal limits  URINALYSIS, ROUTINE W REFLEX MICROSCOPIC (NOT AT [facility]) - Abnormal; Notable for the following:    APPearance CLOUDY (*)    Leukocytes, UA MODERATE (*)    All other components within normal limits  URINE MICROSCOPIC-ADD ON -  Abnormal; Notable for the following:    Squamous Epithelial / LPF 0-5 (*)    Bacteria, UA FEW (*)    All other components within normal limits  I-STAT BETA HCG BLOOD, ED ([facility], [facility], [facility] ONLY)    Imaging Review US Transvaginal Non-ob  11/22/2015  CLINICAL DATA:  Right adnexal pain, concern for torsion EXAM: TRANSABDOMINAL AND TRANSVAGINAL ULTRASOUND OF PELVIS DOPPLER ULTRASOUND OF OVARIES TECHNIQUE: Both transabdominal and transvaginal ultrasound examinations of the pelvis were performed. Transabdominal technique was performed for global imaging of the pelvis including uterus, ovaries, adnexal regions, and pelvic cul-de-sac. It was necessary to proceed with endovaginal exam following the transabdominal exam to visualize the endometrium. Color and duplex Doppler ultrasound was utilized to evaluate blood flow  to the ovaries. COMPARISON:  None. FINDINGS: Uterus Measurements: 9.6 x 5.0 x 6.4 cm. Known partially calcified 2.5 cm fibroid within the body of the uterus Endometrium Thickness: 14 mm.  No focal abnormality visualized. Right ovary Measurements: 32 x 32 x 27 mm. Normal appearance/no adnexal mass. Left ovary Measurements: 37 x 27 x 18 mm. 14 x 18 x 17 mm complex cyst present, containing several internal septations.There is trace blood flow in some of these. Pulsed Doppler evaluation of both ovaries demonstrates normal low-resistance arterial and venous waveforms. Other findings Trace free fluid in the pelvis, within physiologic limits of normal IMPRESSION: No evidence of torsion. Complex left ovarian cystic mass as described above. Malignancy is not excluded based on appearance. Consider surgical consultation. This recommendation follows the consensus statement: Management of Asymptomatic Ovarian and Other Adnexal Cysts Imaged at [place]: Society of Radiologists in [place]. Radiology 2010; [phone]. Electronically Signed   By: Skipper Cliche M.D.   On: 11/22/2015 22:07   US  Pelvis Complete  11/22/2015  CLINICAL DATA:  Right adnexal pain, concern for torsion EXAM: TRANSABDOMINAL AND TRANSVAGINAL ULTRASOUND OF PELVIS DOPPLER ULTRASOUND OF OVARIES TECHNIQUE: Both transabdominal and transvaginal ultrasound examinations of the pelvis were performed. Transabdominal technique was performed for global imaging of the pelvis including uterus, ovaries, adnexal regions, and pelvic cul-de-sac. It was necessary to proceed with endovaginal exam following the transabdominal exam to visualize the endometrium. Color and duplex Doppler ultrasound was utilized to evaluate blood flow to the ovaries. COMPARISON:  None. FINDINGS: Uterus Measurements: 9.6 x 5.0 x 6.4 cm. Known partially calcified 2.5 cm fibroid within the body of the uterus Endometrium Thickness: 14 mm.  No focal abnormality visualized. Right ovary Measurements: 32 x 32 x 27 mm. Normal appearance/no adnexal mass. Left ovary Measurements: 37 x 27 x 18 mm. 14 x 18 x 17 mm complex cyst present, containing several internal septations.There is trace blood flow in some of these. Pulsed Doppler evaluation of both ovaries demonstrates normal low-resistance arterial and venous waveforms. Other findings Trace free fluid in the pelvis, within physiologic limits of normal IMPRESSION: No evidence of torsion. Complex left ovarian cystic mass as described above. Malignancy is not excluded based on appearance. Consider surgical consultation. This recommendation follows the consensus statement: Management of Asymptomatic Ovarian and Other Adnexal Cysts Imaged at [place]: Society of Radiologists in [place]. Radiology 2010; [phone]. Electronically Signed   By: Skipper Cliche M.D.   On: 11/22/2015 22:07   [place] Art/ven Flow Abd Pelv Doppler  11/22/2015  CLINICAL DATA:  Right adnexal pain, concern for torsion EXAM: TRANSABDOMINAL AND TRANSVAGINAL ULTRASOUND OF PELVIS DOPPLER ULTRASOUND OF OVARIES TECHNIQUE: Both transabdominal  and transvaginal ultrasound examinations of the pelvis were performed. Transabdominal technique was performed for global imaging of the pelvis including uterus, ovaries, adnexal regions, and pelvic cul-de-sac. It was necessary to proceed with endovaginal exam following the transabdominal exam to visualize the endometrium. Color and duplex Doppler ultrasound was utilized to evaluate blood flow to the ovaries. COMPARISON:  None. FINDINGS: Uterus Measurements: 9.6 x 5.0 x 6.4 cm. Known partially calcified 2.5 cm fibroid within the body of the uterus Endometrium Thickness: 14 mm.  No focal abnormality visualized. Right ovary Measurements: 32 x 32 x 27 mm. Normal appearance/no adnexal mass. Left ovary Measurements: 37 x 27 x 18 mm. 14 x 18 x 17 mm complex cyst present, containing several internal septations.There is trace blood flow in some of these. Pulsed Doppler evaluation of both ovaries  demonstrates normal low-resistance arterial and venous waveforms. Other findings Trace free fluid in the pelvis, within physiologic limits of normal IMPRESSION: No evidence of torsion. Complex left ovarian cystic mass as described above. Malignancy is not excluded based on appearance. Consider surgical consultation. This recommendation follows the consensus statement: Management of Asymptomatic Ovarian and Other Adnexal Cysts Imaged at [place]: Society of Radiologists in [place]. Radiology 2010; [phone]. Electronically Signed   By: Skipper Cliche M.D.   On: 11/22/2015 22:07   Dg Knee Complete 4 Views Left  11/22/2015  CLINICAL DATA:  Left knee pain and swelling over the past several days. No known injury. 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 Left knee appears otherwise intact. No significant effusion. No evidence of acute fracture or subluxation. No focal bone lesion or bone destruction. Bone cortex and  trabecular architecture appear intact. No radiopaque soft tissue foreign bodies. IMPRESSION: No evidence of acute fracture or dislocation in the left knee. No significant effusion. Electronically Signed   By: Lucienne Capers M.D.   On: 11/22/2015 21:26   I have personally reviewed and evaluated these images and lab results as part of my medical decision-making.   EKG Interpretation None      MDM   Final diagnoses:  Left ovarian cyst  Iron deficiency anemia due to chronic blood loss    Patient is a 39 year old female with a history of fibroids, anemia, seizure disorder and sarcoidosis who presents today with severe onset of right-sided abdominal pain and back pain approximately at 11:00. She's had nausea without vomiting movement makes the pain worse. She's had some diarrhea today but denies vomiting or urinary symptoms. She has had ongoing vaginal bleeding for the last several months but states she is not currently bleeding today. She denies any chest pain or shortness of breath concerning for symptomatic anemia. Labs today show a CBC with a hemoglobin of 8.7 from a hemoglobin 3 months ago of 9.5.  However feel this is a more chronic issue. Feel the patient's pain today is not related to her fibroids.  Concern for kidney stone versus ovarian torsion versus ruptured ovarian cyst. Lower suspicion for bowel etiology.  UA pending, CMP without acute findings and hCG within normal limits. Patient given IV pain and nausea control. We'll do a pelvic exam for further evaluation  8:48 PM On pelvic exam patient had significant pain over the right adnexa. The wall initially start with pelvic ultrasound to rule out torsion or ruptured cyst. Low suspicion for tubo-ovarian abscess. No significant discharge or bleeding on pelvic exam.  10:18 PM On re-evaluation pain has improved.  No rebound or gaurding present and no prominent RLQ pain concerning for appendicitis.  Patient has no evidence of blood in her  urine making kidney stone less likely. Ultrasound shows no evidence of torsion but does show a complex ovarian cyst but cannot rule out malignancy and would require GYN follow-up however in sept u/s was neg for ovarian pathology.  Most likely cyst.  Knee imaging is wnl.  Do not feel pt warrants CT of the abd at this time however pt to return for worsening pain, fever or presistent vomiting for re-eval if sx worsen.  Blanchie Dessert, MD 11/22/15 [id number]  Blanchie Dessert, MD 11/22/15 [id number]

## 2015-11-22 NOTE — ED Notes (Signed)
Pt verbalized understanding of d/c instructions, prescriptions, and follow-up care. No further questions/concerns, VSS, assisted to lobby in wheelchair.  

## 2016-01-21 ENCOUNTER — Encounter (HOSPITAL_COMMUNITY): Payer: Self-pay

## 2016-01-23 ENCOUNTER — Other Ambulatory Visit (HOSPITAL_COMMUNITY): Payer: Self-pay | Admitting: *Deleted

## 2016-01-26 ENCOUNTER — Encounter (HOSPITAL_COMMUNITY)
Admission: RE | Admit: 2016-01-26 | Discharge: 2016-01-26 | Disposition: A | Discharge status: Home / Self Care | Payer: BLUE CROSS/BLUE SHIELD | Source: Ambulatory Visit | Attending: Family Medicine | Admitting: Family Medicine

## 2016-01-26 DIAGNOSIS — R51 Headache: Secondary | ICD-10-CM | POA: Insufficient documentation

## 2016-01-26 DIAGNOSIS — R0602 Shortness of breath: Secondary | ICD-10-CM | POA: Insufficient documentation

## 2016-01-26 DIAGNOSIS — D649 Anemia, unspecified: Secondary | ICD-10-CM | POA: Insufficient documentation

## 2016-01-26 DIAGNOSIS — N920 Excessive and frequent menstruation with regular cycle: Secondary | ICD-10-CM | POA: Insufficient documentation

## 2016-01-26 LAB — PREPARE RBC (CROSSMATCH)

## 2016-01-27 ENCOUNTER — Encounter (HOSPITAL_COMMUNITY)
Admission: RE | Admit: 2016-01-27 | Discharge: 2016-01-27 | Disposition: A | Discharge status: Home / Self Care | Payer: BLUE CROSS/BLUE SHIELD | Source: Ambulatory Visit | Attending: Family Medicine | Admitting: Family Medicine

## 2016-01-27 DIAGNOSIS — D649 Anemia, unspecified: Secondary | ICD-10-CM | POA: Diagnosis not present

## 2016-01-27 MED ORDER — ACETAMINOPHEN 325 MG PO TABS
ORAL_TABLET | ORAL | Status: AC
  Filled 2016-01-27: qty 2

## 2016-01-27 MED ORDER — FUROSEMIDE 10 MG/ML IJ SOLN
INTRAMUSCULAR | Status: AC
  Filled 2016-01-27: qty 2

## 2016-01-27 MED ORDER — SODIUM CHLORIDE 0.9 % IV SOLN: Freq: Once | INTRAVENOUS | Status: DC

## 2016-01-27 MED ORDER — FUROSEMIDE 10 MG/ML IJ SOLN
20.0000 mg | Freq: Once | INTRAMUSCULAR | Status: AC
  Administered 2016-01-27: 20 mg via INTRAVENOUS

## 2016-01-27 MED ORDER — ACETAMINOPHEN 325 MG PO TABS
650.0000 mg | ORAL_TABLET | ORAL | Status: DC
  Administered 2016-01-27: 650 mg via ORAL

## 2016-01-27 NOTE — Progress Notes (Signed)
Blood stopped because issue time of blood was 0855.  Pt received about 221ml of first unit.  Blood bank notified.

## 2016-01-27 NOTE — Progress Notes (Signed)
Called into the room by patient.  She stated that she feels like she can not catch her breath and has a splitting headache.  She does not appear to be in distress and VS are stable, respiratory rate at 18 and O2 sats remain at 100% on room air.  I paused the blood and currently have normal saline infusing. Patient states that she has had blood once before last year as an inpatient and did not experience these symptoms.  Pt is on xanax at home but states she did not take it today.  She said she did take one benadryl this morning before she came to the short stay today.  Called Dr Stephanie Acre, spoke with [facility] and reported all of the above.  Waiting on return call from MD

## 2016-01-27 NOTE — Progress Notes (Signed)
Called Dr Stephanie Acre office back because I had not heard from the DR yet.  The office stated my note is in the que and high priority and they are just waiting on the DR.  The patient states her shortness of breath is now better but her headache is still the same since the blood has been stopped.  Will continue to monitor patient and awaiting MD return call.

## 2016-01-27 NOTE — Progress Notes (Signed)
MD was called and orders were given at 1140 to give pt 20 of IV lasix and 650 of tylenol.  Dr said to hold blood for 30 minutes after meds were given.  PTs blood was restarted @ 1215,  30 minutes after lasix and tylenol were given per MD order.  Pt stated that she was feeling much better

## 2016-01-28 LAB — TYPE AND SCREEN
ABO/RH(D): A POS
Antibody Screen: NEGATIVE
Unit division: 0
Unit division: 0

## 2016-02-02 ENCOUNTER — Emergency Department (HOSPITAL_COMMUNITY)
Admission: EM | Admit: 2016-02-02 | Discharge: 2016-02-02 | Disposition: A | Discharge status: Home / Self Care | Payer: BLUE CROSS/BLUE SHIELD | Attending: Emergency Medicine | Admitting: Emergency Medicine

## 2016-02-02 ENCOUNTER — Encounter (HOSPITAL_COMMUNITY): Payer: Self-pay | Admitting: *Deleted

## 2016-02-02 ENCOUNTER — Emergency Department (HOSPITAL_COMMUNITY): Payer: BLUE CROSS/BLUE SHIELD

## 2016-02-02 DIAGNOSIS — Z87891 Personal history of nicotine dependence: Secondary | ICD-10-CM | POA: Diagnosis not present

## 2016-02-02 DIAGNOSIS — D649 Anemia, unspecified: Secondary | ICD-10-CM | POA: Diagnosis not present

## 2016-02-02 DIAGNOSIS — Z79899 Other long term (current) drug therapy: Secondary | ICD-10-CM | POA: Insufficient documentation

## 2016-02-02 DIAGNOSIS — Z8739 Personal history of other diseases of the musculoskeletal system and connective tissue: Secondary | ICD-10-CM | POA: Insufficient documentation

## 2016-02-02 DIAGNOSIS — R509 Fever, unspecified: Secondary | ICD-10-CM | POA: Diagnosis not present

## 2016-02-02 DIAGNOSIS — R112 Nausea with vomiting, unspecified: Secondary | ICD-10-CM | POA: Insufficient documentation

## 2016-02-02 DIAGNOSIS — R42 Dizziness and giddiness: Secondary | ICD-10-CM | POA: Diagnosis not present

## 2016-02-02 DIAGNOSIS — R1011 Right upper quadrant pain: Secondary | ICD-10-CM

## 2016-02-02 DIAGNOSIS — R0602 Shortness of breath: Secondary | ICD-10-CM | POA: Insufficient documentation

## 2016-02-02 DIAGNOSIS — Z86018 Personal history of other benign neoplasm: Secondary | ICD-10-CM | POA: Diagnosis not present

## 2016-02-02 DIAGNOSIS — R630 Anorexia: Secondary | ICD-10-CM | POA: Insufficient documentation

## 2016-02-02 DIAGNOSIS — G40909 Epilepsy, unspecified, not intractable, without status epilepticus: Secondary | ICD-10-CM | POA: Diagnosis not present

## 2016-02-02 LAB — URINALYSIS, ROUTINE W REFLEX MICROSCOPIC
Bilirubin Urine: NEGATIVE
Glucose, UA: NEGATIVE mg/dL
Ketones, ur: NEGATIVE mg/dL
Leukocytes, UA: NEGATIVE
Nitrite: NEGATIVE
Protein, ur: NEGATIVE mg/dL
Specific Gravity, Urine: 1.014 (ref 1.005–1.030)
pH: 8 (ref 5.0–8.0)

## 2016-02-02 LAB — CBC
HCT: 31 % — ABNORMAL LOW (ref 36.0–46.0)
Hemoglobin: 9.5 g/dL — ABNORMAL LOW (ref 12.0–15.0)
MCH: 21.8 pg — ABNORMAL LOW (ref 26.0–34.0)
MCHC: 30.6 g/dL (ref 30.0–36.0)
MCV: 71.1 fL — ABNORMAL LOW (ref 78.0–100.0)
Platelets: 216 10*3/uL (ref 150–400)
RBC: 4.36 MIL/uL (ref 3.87–5.11)
RDW: 17.6 % — ABNORMAL HIGH (ref 11.5–15.5)
WBC: 7.2 10*3/uL (ref 4.0–10.5)

## 2016-02-02 LAB — COMPREHENSIVE METABOLIC PANEL
ALT: 17 U/L (ref 14–54)
AST: 19 U/L (ref 15–41)
Albumin: 4 g/dL (ref 3.5–5.0)
Alkaline Phosphatase: 47 U/L (ref 38–126)
Anion gap: 15 (ref 5–15)
BUN: <5 mg/dL — ABNORMAL LOW (ref 6–20)
CO2: 26 mmol/L (ref 22–32)
Calcium: 9.1 mg/dL (ref 8.9–10.3)
Chloride: 97 mmol/L — ABNORMAL LOW (ref 101–111)
Creatinine, Ser: 1.03 mg/dL — ABNORMAL HIGH (ref 0.44–1.00)
GFR calc Af Amer: >60 mL/min (ref >60)
GFR calc non Af Amer: >60 mL/min (ref >60)
Glucose, Bld: 88 mg/dL (ref 65–99)
Potassium: 3.5 mmol/L (ref 3.5–5.1)
Sodium: 138 mmol/L (ref 135–145)
Total Bilirubin: 0.6 mg/dL (ref 0.3–1.2)
Total Protein: 7.2 g/dL (ref 6.5–8.1)

## 2016-02-02 LAB — URINE MICROSCOPIC-ADD ON

## 2016-02-02 LAB — PREGNANCY, URINE: Preg Test, Ur: NEGATIVE

## 2016-02-02 MED ORDER — MORPHINE SULFATE (PF) 4 MG/ML IV SOLN
4.0000 mg | Freq: Once | INTRAVENOUS | Status: AC
  Administered 2016-02-02: 4 mg via INTRAVENOUS
  Filled 2016-02-02: qty 1

## 2016-02-02 MED ORDER — ONDANSETRON 4 MG PO TBDP: 4.0000 mg | ORAL_TABLET | Freq: Three times a day (TID) | ORAL | Status: DC | PRN

## 2016-02-02 MED ORDER — SODIUM CHLORIDE 0.9 % IV BOLUS (SEPSIS)
1000.0000 mL | Freq: Once | INTRAVENOUS | Status: AC
  Administered 2016-02-02: 1000 mL via INTRAVENOUS

## 2016-02-02 MED ORDER — MORPHINE SULFATE (PF) 2 MG/ML IV SOLN
2.0000 mg | Freq: Once | INTRAVENOUS | Status: AC
  Administered 2016-02-02: 2 mg via INTRAVENOUS
  Filled 2016-02-02: qty 1

## 2016-02-02 MED ORDER — ONDANSETRON HCL 4 MG/2ML IJ SOLN
4.0000 mg | Freq: Once | INTRAMUSCULAR | Status: AC
  Administered 2016-02-02: 4 mg via INTRAVENOUS
  Filled 2016-02-02: qty 2

## 2016-02-02 NOTE — ED Notes (Signed)
Patient transported to Ultrasound 

## 2016-02-02 NOTE — ED Provider Notes (Signed)
CSN: [id number]     Arrival date & time 02/02/16  [id number] History   First MD Initiated Contact with Patient 02/02/16 1006     Chief Complaint  Patient presents with  . Abdominal Pain  . Headache   (Consider location/radiation/quality/duration/timing/severity/associated sxs/prior Treatment) HPI 40 y.o. female with a hx of Fibroids, Sarcoidosis, Anemia, presents to the Emergency Department today complaining of abdominal pain since this morning. States that the pain is in her RUQ and is a sharp 10/10 pain. Pain lasts in bouts of 5-10 minutes. Reports associated N/V w/ emesis a yellow color, dizziness, fever, and shortness of breath. Notes a decrease in PO intake. No diarrhea. No URI symptoms (nasal congestion, cough, ear pain). No dysuria/hematuria. No vaginal discharge/bleeding. No other symptoms noted     Past Medical History  Diagnosis Date  . Seizures ([place])   . Anemia   . Sarcoidosis [facility])    Past Surgical History  Procedure Laterality Date  . Tubal ligation     Family History  Problem Relation Age of Onset  . Adopted: Yes  . Other Son     Growing pains   Social History  Substance Use Topics  . Smoking status: Former Smoker -- 0.20 packs/day for 17 years    Types: Cigarettes  . Smokeless tobacco: None     Comment: Quit 2011  . Alcohol Use: No   OB History    Gravida Para Term Preterm AB TAB SAB Ectopic Multiple Living   1              Review of Systems ROS reviewed and all are negative for acute change except as noted in the HPI.  Allergies  Aspirin  Home Medications   Prior to Admission medications   Medication Sig Start Date End Date Taking? Authorizing Provider  ALPRAZolam Duanne Moron) 0.25 MG tablet Take 0.25 mg by mouth at bedtime as needed for anxiety or sleep.    Historical Provider, MD  ferrous sulfate 325 (65 FE) MG tablet Take 1 tablet (325 mg total) by mouth 3 (three) times daily with meals. 11/22/15   Blanchie Dessert, MD  HYDROcodone-acetaminophen  (NORCO/VICODIN) 5-325 MG tablet Take 1-2 tablets by mouth every 6 (six) hours as needed. 11/22/15   Blanchie Dessert, MD  meloxicam (MOBIC) 15 MG tablet Take 15 mg by mouth daily as needed for pain.    Historical Provider, MD  oxyCODONE-acetaminophen (PERCOCET/ROXICET) 5-325 MG per tablet Take 1 tablet by mouth every 4 (four) hours as needed for severe pain. 09/22/15   Antony Blackbird, MD  phenytoin (DILANTIN) 100 MG ER capsule Take 3 capsules (300 mg total) by mouth at bedtime. 05/12/15   Merryl Hacker, MD   BP 164/114 mmHg  Pulse 67  Temp(Src) 98.4 F (36.9 C) (Oral)  Resp 28  Ht 5\' 9"  (1.753 m)  Wt 122.925 kg  BMI 40.00 kg/m2  SpO2 100%   Physical Exam  Constitutional: She is oriented to person, place, and time. She appears well-developed and well-nourished.  HENT:  Head: Normocephalic and atraumatic.  Eyes: EOM are normal.  Neck: Normal range of motion. Neck supple.  Cardiovascular: Normal rate, regular rhythm and normal heart sounds.   Pulmonary/Chest: Effort normal and breath sounds normal.  Abdominal: Soft. Normal appearance and bowel sounds are normal. She exhibits no distension and no ascites. There is tenderness in the right upper quadrant. There is positive Murphy's sign. There is no rigidity, no rebound, no guarding, no CVA tenderness and no tenderness at McBurney's  point.  Musculoskeletal: Normal range of motion.  Neurological: She is alert and oriented to person, place, and time.  Skin: Skin is warm and dry.  Psychiatric: She has a normal mood and affect. Her behavior is normal. Thought content normal.  Nursing note and vitals reviewed.   ED Course  Procedures (including critical care time) Labs Review Labs Reviewed  CBC - Abnormal; Notable for the following:    Hemoglobin 9.5 (*)    HCT 31.0 (*)    MCV 71.1 (*)    MCH 21.8 (*)    RDW 17.6 (*)    All other components within normal limits  COMPREHENSIVE METABOLIC PANEL - Abnormal; Notable for the following:     Chloride 97 (*)    BUN <5 (*)    Creatinine, Ser 1.03 (*)    All other components within normal limits  URINALYSIS, ROUTINE W REFLEX MICROSCOPIC (NOT AT [facility]) - Abnormal; Notable for the following:    Hgb urine dipstick LARGE (*)    All other components within normal limits  URINE MICROSCOPIC-ADD ON - Abnormal; Notable for the following:    Squamous Epithelial / LPF 0-5 (*)    Bacteria, UA RARE (*)    All other components within normal limits  PREGNANCY, URINE   Imaging Review US Abdomen Limited Ruq  02/02/2016  CLINICAL DATA:  Right upper quadrant pain since this morning EXAM: US ABDOMEN LIMITED - RIGHT UPPER QUADRANT COMPARISON:  None. FINDINGS: Gallbladder: No gallstones or wall thickening visualized. No sonographic Murphy sign noted by sonographer. Common bile duct: Diameter: Normal caliber, 3 mm Liver: No focal lesion identified. Within normal limits in parenchymal echogenicity. IMPRESSION: No acute findings. Electronically Signed   By: Rolm Baptise M.D.   On: 02/02/2016 11:40   I have personally reviewed and evaluated these images and lab results as part of my medical decision-making.   EKG Interpretation None      MDM  I have reviewed relevant laboratory values. I have reviewed relevant imaging studies.I have reviewed the relevant previous healthcare records.I obtained HPI from historian. Patient discussed with supervising physician  ED Course:  Assessment: 36y F hx Sarcoidosis, Anemia presents with RUQ since this morning. On exam, she is afebrile. VSS. NAD. Lungs CTA. Heart RRR. Abd exam showed Positive Murphys. Initial RUQ [place] neg for cholecystitis. Labs unremarkable and are at baseline. On reexamination, pt feeling much better with fluids. Patient is in no acute distress. Vital Signs are stable. Patient is able to ambulate. Patient able to tolerate PO. Most likely symptoms of viral etiology. Will DC with follow up to PCP for further management of symptoms.    Disposition/Plan:   DC Home Additional Verbal discharge instructions given and discussed with patient.  Pt Instructed to f/u with PCP in the next 48 hours for evaluation and treatment of symptoms. Return precautions given Pt acknowledges and agrees with plan  Supervising Physician Alfonzo Beers, MD   Final diagnoses:  RUQ abdominal pain  Right upper quadrant pain       Shary Decamp, PA-C 02/02/16 [place], MD 02/02/16 [phone]

## 2016-02-02 NOTE — ED Notes (Signed)
Pt reports onset this am of severe abd pain and n/v. Denies diarrhea. Also having severe headache, hx of migraines. Hyperventilating on arrival, reports numbness to hands.

## 2016-02-02 NOTE — Discharge Instructions (Signed)
Please read and follow all provided instructions.  Your diagnoses today include:  1. RUQ abdominal pain   2. Right upper quadrant pain    Tests performed today include:  Blood counts and electrolytes  Blood tests to check liver and kidney function  Blood tests to check pancreas function  Urine test to look for infection and pregnancy (in women)  Vital signs. See below for your results today.   Medications prescribed:   Take any prescribed medications only as directed.  Home care instructions:   Follow any educational materials contained in this packet.  Follow-up instructions: Please follow-up with your primary care provider in the next 2 days for further evaluation of your symptoms.    Return instructions:  SEEK IMMEDIATE MEDICAL ATTENTION IF:  The pain does not go away or becomes severe   A temperature above 101F develops   Repeated vomiting occurs (multiple episodes)   The pain becomes localized to portions of the abdomen. The right side could possibly be appendicitis. In an adult, the left lower portion of the abdomen could be colitis or diverticulitis.   Blood is being passed in stools or vomit (bright red or black tarry stools)   You develop chest pain, difficulty breathing, dizziness or fainting, or become confused, poorly responsive, or inconsolable (young children)  If you have any other emergent concerns regarding your health  Additional Information: Abdominal (belly) pain can be caused by many things. Your caregiver performed an examination and possibly ordered blood/urine tests and imaging (CT scan, x-rays, ultrasound). Many cases can be observed and treated at home after initial evaluation in the emergency department. Even though you are being discharged home, abdominal pain can be unpredictable. Therefore, you need a repeated exam if your pain does not resolve, returns, or worsens. Most patients with abdominal pain don't have to be admitted to the hospital  or have surgery, but serious problems like appendicitis and gallbladder attacks can start out as nonspecific pain. Many abdominal conditions cannot be diagnosed in one visit, so follow-up evaluations are very important.  Your vital signs today were: BP 135/91 mmHg   Pulse 73   Temp(Src) 98.4 F (36.9 C) (Oral)   Resp 28   Ht 5\' 9"  (1.753 m)   Wt 122.925 kg   BMI 40.00 kg/m2   SpO2 100% If your blood pressure (bp) was elevated above 135/85 this visit, please have this repeated by your doctor within one month. --------------

## 2016-02-03 ENCOUNTER — Emergency Department (HOSPITAL_COMMUNITY)
Admission: EM | Admit: 2016-02-03 | Discharge: 2016-02-03 | Disposition: A | Discharge status: Home / Self Care | Payer: BLUE CROSS/BLUE SHIELD | Attending: Emergency Medicine | Admitting: Emergency Medicine

## 2016-02-03 ENCOUNTER — Emergency Department (HOSPITAL_COMMUNITY): Payer: BLUE CROSS/BLUE SHIELD

## 2016-02-03 ENCOUNTER — Encounter (HOSPITAL_COMMUNITY): Payer: Self-pay | Admitting: Cardiology

## 2016-02-03 DIAGNOSIS — Z7951 Long term (current) use of inhaled steroids: Secondary | ICD-10-CM | POA: Insufficient documentation

## 2016-02-03 DIAGNOSIS — Z87891 Personal history of nicotine dependence: Secondary | ICD-10-CM | POA: Insufficient documentation

## 2016-02-03 DIAGNOSIS — R1110 Vomiting, unspecified: Secondary | ICD-10-CM | POA: Diagnosis not present

## 2016-02-03 DIAGNOSIS — R1010 Upper abdominal pain, unspecified: Secondary | ICD-10-CM | POA: Diagnosis present

## 2016-02-03 DIAGNOSIS — R1013 Epigastric pain: Secondary | ICD-10-CM | POA: Diagnosis not present

## 2016-02-03 DIAGNOSIS — Z7952 Long term (current) use of systemic steroids: Secondary | ICD-10-CM | POA: Insufficient documentation

## 2016-02-03 DIAGNOSIS — R252 Cramp and spasm: Secondary | ICD-10-CM

## 2016-02-03 DIAGNOSIS — D649 Anemia, unspecified: Secondary | ICD-10-CM | POA: Insufficient documentation

## 2016-02-03 DIAGNOSIS — Z79899 Other long term (current) drug therapy: Secondary | ICD-10-CM | POA: Insufficient documentation

## 2016-02-03 LAB — COMPREHENSIVE METABOLIC PANEL
ALT: 15 U/L (ref 14–54)
AST: 18 U/L (ref 15–41)
Albumin: 3.4 g/dL — ABNORMAL LOW (ref 3.5–5.0)
Alkaline Phosphatase: 44 U/L (ref 38–126)
Anion gap: 9 (ref 5–15)
BUN: 11 mg/dL (ref 6–20)
CO2: 28 mmol/L (ref 22–32)
Calcium: 8.4 mg/dL — ABNORMAL LOW (ref 8.9–10.3)
Chloride: 103 mmol/L (ref 101–111)
Creatinine, Ser: 1.02 mg/dL — ABNORMAL HIGH (ref 0.44–1.00)
GFR calc Af Amer: >60 mL/min (ref >60)
GFR calc non Af Amer: >60 mL/min (ref >60)
Glucose, Bld: 99 mg/dL (ref 65–99)
Potassium: 3.5 mmol/L (ref 3.5–5.1)
Sodium: 140 mmol/L (ref 135–145)
Total Bilirubin: 0.4 mg/dL (ref 0.3–1.2)
Total Protein: 6.8 g/dL (ref 6.5–8.1)

## 2016-02-03 LAB — CBC
HCT: 32 % — ABNORMAL LOW (ref 36.0–46.0)
Hemoglobin: 9.1 g/dL — ABNORMAL LOW (ref 12.0–15.0)
MCH: 20.3 pg — ABNORMAL LOW (ref 26.0–34.0)
MCHC: 28.4 g/dL — ABNORMAL LOW (ref 30.0–36.0)
MCV: 71.3 fL — ABNORMAL LOW (ref 78.0–100.0)
Platelets: 233 10*3/uL (ref 150–400)
RBC: 4.49 MIL/uL (ref 3.87–5.11)
RDW: 17.8 % — ABNORMAL HIGH (ref 11.5–15.5)
WBC: 4.8 10*3/uL (ref 4.0–10.5)

## 2016-02-03 LAB — LIPASE, BLOOD: Lipase: 33 U/L (ref 11–51)

## 2016-02-03 MED ORDER — MORPHINE SULFATE (PF) 4 MG/ML IV SOLN
4.0000 mg | Freq: Once | INTRAVENOUS | Status: AC
  Administered 2016-02-03: 4 mg via INTRAVENOUS
  Filled 2016-02-03: qty 1

## 2016-02-03 MED ORDER — DIPHENHYDRAMINE HCL 50 MG/ML IJ SOLN
25.0000 mg | Freq: Once | INTRAMUSCULAR | Status: AC
  Administered 2016-02-03: 25 mg via INTRAVENOUS
  Filled 2016-02-03: qty 1

## 2016-02-03 MED ORDER — CALCIUM CARBONATE ANTACID 500 MG PO CHEW: 1.0000 | CHEWABLE_TABLET | Freq: Every day | ORAL | Status: DC

## 2016-02-03 MED ORDER — CALCIUM CARBONATE ANTACID 500 MG PO CHEW
1.0000 | CHEWABLE_TABLET | Freq: Once | ORAL | Status: AC
  Administered 2016-02-03: 200 mg via ORAL
  Filled 2016-02-03: qty 1

## 2016-02-03 MED ORDER — ONDANSETRON HCL 4 MG/2ML IJ SOLN
4.0000 mg | Freq: Once | INTRAMUSCULAR | Status: AC
  Administered 2016-02-03: 4 mg via INTRAVENOUS
  Filled 2016-02-03: qty 2

## 2016-02-03 MED ORDER — DIPHENHYDRAMINE HCL 25 MG PO TABS: 50.0000 mg | ORAL_TABLET | Freq: Four times a day (QID) | ORAL | Status: DC

## 2016-02-03 NOTE — ED Provider Notes (Signed)
CSN: [id number]     Arrival date & time 02/03/16  1107 History   First MD Initiated Contact with Patient 02/03/16 1355     Chief Complaint  Patient presents with  . Abdominal Pain     (Consider location/radiation/quality/duration/timing/severity/associated sxs/prior Treatment) HPI  Pt presenting with c/o ongoing upper abdominal pain and vomiting.  She was seen in the ED yesterday and had reassuring labs and ultrasound.  Today she states she continues to have abdominal soreness in upper abdomen and radiating bilaterally under ribs. No cough or difficulty breathing.  No diarrhea.  No fever.  Pain is constant and dull.  She has been able to drink liquids somewhat, but has vomited some up.  Has not been eating solid foods.  No sick contacts or recent travel.  There are no other associated systemic symptoms, there are no other alleviating or modifying factors.  Pt also c/o cramping in her hands and feet which is intermittent and started last night.    Past Medical History  Diagnosis Date  . Seizures ([place])   . Anemia   . Sarcoidosis [facility])    Past Surgical History  Procedure Laterality Date  . Tubal ligation     Family History  Problem Relation Age of Onset  . Adopted: Yes  . Other Son     Growing pains   Social History  Substance Use Topics  . Smoking status: Former Smoker -- 0.20 packs/day for 17 years    Types: Cigarettes  . Smokeless tobacco: None     Comment: Quit 2011  . Alcohol Use: No   OB History    Gravida Para Term Preterm AB TAB SAB Ectopic Multiple Living   1              Review of Systems  ROS reviewed and all otherwise negative except for mentioned in HPI    Allergies  Aspirin  Home Medications   Prior to Admission medications   Medication Sig Start Date End Date Taking? Authorizing Provider  ALPRAZolam Duanne Moron) 0.25 MG tablet Take 0.25 mg by mouth at bedtime as needed for anxiety or sleep.   Yes Historical Provider, MD  ferrous sulfate 325 (65 FE) MG  tablet Take 1 tablet (325 mg total) by mouth 3 (three) times daily with meals. 11/22/15  Yes Blanchie Dessert, MD  HYDROcodone-acetaminophen (NORCO/VICODIN) 5-325 MG tablet Take 1-2 tablets by mouth every 6 (six) hours as needed. 11/22/15  Yes Blanchie Dessert, MD  meloxicam (MOBIC) 15 MG tablet Take 15 mg by mouth daily as needed for pain.   Yes Historical Provider, MD  mometasone-formoterol (DULERA) 100-5 MCG/ACT AERO Inhale 2 puffs into the lungs 2 (two) times daily.   Yes Historical Provider, MD  ondansetron (ZOFRAN ODT) 4 MG disintegrating tablet Take 1 tablet (4 mg total) by mouth every 8 (eight) hours as needed for nausea or vomiting. 02/02/16  Yes Shary Decamp, PA-C  oxyCODONE-acetaminophen (PERCOCET/ROXICET) 5-325 MG per tablet Take 1 tablet by mouth every 4 (four) hours as needed for severe pain. 09/22/15  Yes Antony Blackbird, MD  pantoprazole (PROTONIX) 40 MG tablet Take 40 mg by mouth daily as needed (Acid reflux).  01/15/16  Yes Historical Provider, MD  predniSONE (DELTASONE) 20 MG tablet Take 20 mg by mouth daily with breakfast. Take 1 tablet each morning for two more days. Last dose is due Wednseday 02/04/16.   Yes Historical Provider, MD  tranexamic acid (LYSTEDA) 650 MG TABS tablet Take 1,300 mg by mouth 3 (three) times  daily.   Yes Historical Provider, MD  Vitamin D, Ergocalciferol, (DRISDOL) 50000 units CAPS capsule Take 50,000 Units by mouth every 7 (seven) days. Take every Saturday for 8 weeks, and then begin taking Vitamin D 1000-2000 units by mouth daily.   Yes Historical Provider, MD  calcium carbonate (TUMS) 500 MG chewable tablet Chew 1 tablet (200 mg of elemental calcium total) by mouth daily. 02/03/16   Alfonzo Beers, MD  diphenhydrAMINE (BENADRYL) 25 MG tablet Take 2 tablets (50 mg total) by mouth every 6 (six) hours. Take 1-2 tablets every 6 hours x 2 days, then space out to an as needed basis 02/03/16   Alfonzo Beers, MD  phenytoin (DILANTIN) 100 MG ER capsule Take 3 capsules (300 mg  total) by mouth at bedtime. Patient not taking: Reported on 02/03/2016 05/12/15   Merryl Hacker, MD   BP 126/80 mmHg  Pulse 61  Temp(Src) 98.4 F (36.9 C) (Oral)  Resp 20  SpO2 100%  Vitals reviewed Physical Exam  Physical Examination: General appearance - alert, well appearing, and in no distress Mental status - alert, oriented to person, place, and time Eyes -no conjunctival injection, no scleral icterus Mouth - mucous membranes moist, pharynx normal without lesions Chest - clear to auscultation, no wheezes, rales or rhonchi, symmetric air entry Heart - normal rate, regular rhythm, normal S1, S2, no murmurs, rubs, clicks or gallops Abdomen - soft, nontender, nondistended, no masses or organomegaly Neurological - alert, oriented, normal speech Extremities - peripheral pulses normal, no pedal edema, no clubbing or cyanosis Skin - normal coloration and turgor, no rashes  ED Course  Procedures (including critical care time) Labs Review Labs Reviewed  CBC - Abnormal; Notable for the following:    Hemoglobin 9.1 (*)    HCT 32.0 (*)    MCV 71.3 (*)    MCH 20.3 (*)    MCHC 28.4 (*)    RDW 17.8 (*)    All other components within normal limits  COMPREHENSIVE METABOLIC PANEL - Abnormal; Notable for the following:    Creatinine, Ser 1.02 (*)    Calcium 8.4 (*)    Albumin 3.4 (*)    All other components within normal limits  LIPASE, BLOOD    Imaging Review No results found. I have personally reviewed and evaluated these images and lab results as part of my medical decision-making.   EKG Interpretation None      MDM   Final diagnoses:  Epigastric pain  Muscle cramping  Hypocalcemia    Pt presenting with c/o upper abdominal pain and muscle cramping.  Her labs are reassuring with the exception of mildly low calcium- pt was treated with calcium carbonate- this may be associated with the muscle cramps and possibly the abdominal pain.  She was tolerating po in the ED after  meds.  Discharged with strict return precautions.  Pt agreeable with plan.    Alfonzo Beers, MD 02/06/16 [phone]

## 2016-02-03 NOTE — Discharge Instructions (Signed)
Return to the ED with any concerns including vomiting and not able to keep down liquids, difficulty breathing, lip or tongue swelling, decreased level of alertness/lethargy, or any other alarming symptoms

## 2016-02-03 NOTE — ED Notes (Signed)
Pt called nurse into room, wants something for pain, crackers, and ginger ale. Per MD can have pain meds, but nothing to eat and drink at this time. Pt notified.

## 2016-02-03 NOTE — ED Notes (Signed)
Rash assessment to L arm improved at discharge. Pt still has small amount of swelling in hand.

## 2016-02-03 NOTE — ED Notes (Signed)
Pt to department via EMS- pt was here yesterday for abd pain and vomiting. Had an evaluation done and dc'ed home. States she is not feeling any better today. Bp-100/50 Hr-96

## 2016-02-03 NOTE — ED Notes (Signed)
Immediately after giving Morphine and Zofran left arm from mid upper arm to fingers reddened and small hives.  NO c/o itching.  MD made aware .

## 2016-02-04 ENCOUNTER — Other Ambulatory Visit: Payer: Self-pay | Admitting: Obstetrics and Gynecology

## 2016-02-04 DIAGNOSIS — R928 Other abnormal and inconclusive findings on diagnostic imaging of breast: Secondary | ICD-10-CM

## 2016-02-11 ENCOUNTER — Other Ambulatory Visit: Payer: BLUE CROSS/BLUE SHIELD

## 2016-02-16 ENCOUNTER — Other Ambulatory Visit: Payer: Self-pay | Admitting: Obstetrics and Gynecology

## 2016-02-16 DIAGNOSIS — R109 Unspecified abdominal pain: Secondary | ICD-10-CM

## 2016-02-19 ENCOUNTER — Other Ambulatory Visit: Payer: Self-pay | Admitting: Family Medicine

## 2016-02-19 ENCOUNTER — Ambulatory Visit
Admission: RE | Admit: 2016-02-19 | Discharge: 2016-02-19 | Disposition: A | Discharge status: Home / Self Care | Payer: BLUE CROSS/BLUE SHIELD | Source: Ambulatory Visit | Attending: Obstetrics and Gynecology | Admitting: Obstetrics and Gynecology

## 2016-02-19 ENCOUNTER — Ambulatory Visit
Admission: RE | Admit: 2016-02-19 | Discharge: 2016-02-19 | Disposition: A | Discharge status: Home / Self Care | Payer: BLUE CROSS/BLUE SHIELD | Source: Ambulatory Visit | Attending: Family Medicine | Admitting: Family Medicine

## 2016-02-19 DIAGNOSIS — R109 Unspecified abdominal pain: Secondary | ICD-10-CM

## 2016-02-19 DIAGNOSIS — M549 Dorsalgia, unspecified: Secondary | ICD-10-CM

## 2016-02-27 ENCOUNTER — Other Ambulatory Visit: Payer: Self-pay | Admitting: Obstetrics and Gynecology

## 2016-03-01 ENCOUNTER — Ambulatory Visit: Payer: BLUE CROSS/BLUE SHIELD | Attending: Family Medicine

## 2016-03-01 ENCOUNTER — Other Ambulatory Visit (HOSPITAL_COMMUNITY): Payer: Self-pay | Admitting: Obstetrics and Gynecology

## 2016-03-01 DIAGNOSIS — M62830 Muscle spasm of back: Secondary | ICD-10-CM | POA: Insufficient documentation

## 2016-03-01 DIAGNOSIS — R6889 Other general symptoms and signs: Secondary | ICD-10-CM | POA: Insufficient documentation

## 2016-03-01 DIAGNOSIS — M5441 Lumbago with sciatica, right side: Secondary | ICD-10-CM | POA: Insufficient documentation

## 2016-03-01 DIAGNOSIS — R293 Abnormal posture: Secondary | ICD-10-CM | POA: Insufficient documentation

## 2016-03-01 NOTE — H&P (Signed)
Victoria Moore is a 40 y.o.  female P; 1-0-1-1 who  presents for hysterectomy because of  menorrhagia, severe anemia and uterine fibroids.  Over the past year the patient's periods lasted  5 -10 days with clots and  the need to use over 12 pads a day.   In June 2016 and January 2017 the patient underwent blood transfusions for severe anemia caused by her heavy menstrual flow.  Additionally she experienced cramping rated at 10/10 on a 10 point pain scale with no relief from over the counter analgesia or Percocet.  She reports that Tylenol #3 decreased her pain to an 8/10 on a 10 point pain scale.  She denies any bowel or bladder problems but admits to deep dyspareunia regardless of position.   A pelvic ultrasound in 2017 revealed: uterus: 7.73 x 5.54 x 6.84 cm with endometrium containing an area of focal thickening containing vascularity-1 cm ? polyp;  #4 fibroids:  intramural: lower uterine segment-2.26 x 2.51 x 2.41 cm; left lateral-2.81 x 2.21 x 2.66 cm;  fundal-1.60 x 1.46 x 1.29 cm and Sub-serosal: right lateral-1.72 x 1.53 x 1.56 cm;  Normal right ovary and Left ovary with a complex ? hemorrhagic cyst 1.6 x 1.4 x 1.5 cm.   An endometrial biopsy in that same month showed: benign endometrial type polyp with background secretory endometrium;  no hyperplasia or malignancy;  TSH was normal, hemoglobin/hematocrit = 9.9/32.5 respectively and CA-125 was in normal range.     The patient was given Provera and Lysteda as therapies to manage her bleeding however she bled more with the former and no change with the latter.  The patient,  who is S/P tubal sterilization was given other medical options for managing her symptoms along with surgical options.  Given the debilitating nature of her symptoms however and lack of response to previous therapies she has decided to proceed with definitive therapy in the form of hysterectomy.   Past Medical History  OB History: G: 1;   P: 1-0-1-1;  SVB (8 lbs. 1 oz.) 1994  GYN  History: menarche: 40 YO    LMP: 02/23/2016   Contracepton bilateral tubal ligation  The patient reports a past history of: trichomonas.  Denies history of abnormal PAP smear.   Last PAP smear: 2017, normal with negative HPV  Medical History: Seizure Disorder,  Migraine, Vitamin D Deficiency, Anemia (Hgb. as low as 7.6), Gastroesophageal Reflux Disease, Sarcoidosis, Anxiety, Bronchitis and Pre-Diabetes  Surgical History: 1997 Tubal Sterilization and 1995 Dilatation and Curettage Reports that she requires more than usual anesthesia  and has a  history of blood transfusions.  Family History:  Unknown (patient is adopted)  Social History: Married and employed with [employer];   Former smoker of tobacco and denies any alcohol use   Medications:  Tylenol with Codeine 300/30 every 6 hours prn Cyclobenzaprine 10 mg  every 6 hours prn Dulera 2 puffs  bid Vitamin D 50,000 units weekly Ferrous Sulfate 325 mg  daily Xanax 1 mg  prn  Allergies  Allergen Reactions  . Aspirin Anaphylaxis and Hives  Meloxicam causes GI upset  Denies sensitivity to peanuts, shellfish, soy, latex or adhesives.   ROS: Admits to glasses, frequent headaches,  chest pain/shortness of breath related to sarcoidosis; left ankle swelling, muscle spasms in arms/back/feet and extremity cramping;   Denies vision changes, nasal congestion, dysphagia, tinnitus, dizziness, hoarseness, cough,  nausea, vomiting, diarrhea,constipation,  urinary frequency, urgency  dysuria, hematuria, vaginitis symptoms, ,easy bruising,  myalgias, arthralgias,  skin rashes, unexplained weight loss and except as is mentioned in the history of present illness, patient's review of systems is otherwise negative.     Physical Exam  Bp: 114/66       P: 68      R: 20  Temperature: 99 degrees F orally    Weight: 272 lbs.  Height: 5' 9.5"   BMI: 39.6  Neck: supple without masses or thyromegaly Lungs: clear to auscultation Heart: regular rate and  rhythm Abdomen: soft, non-tender and no organomegaly Pelvic:EGBUS- wnl; vagina-normal rugae; uterus-8-10 weeks  size,irregular, cervix without lesions or motion tenderness; adnexae-no tenderness or masses Extremities:  no clubbing, cyanosis or edema   Assesment:  Menorrhagia            History of Severe Anemia            Uterine Fibroids            Dysmenorrhea            Left Ovarian Cyst   Disposition:  A discussion was held with patient regarding the indication for her procedure(s) along with the risks, which include but are not limited to: reaction to anesthesia, damage to adjacent organs, infection,  excessive bleeding. and the possibility of an open abdominal incision.  The patient verbalized understanding of these risks and has consented to proceed with a Laparoscopically Assisted Vaginal Hysterectomy with Bilateral Salpingectomy,  Possible Left Ovarian Cystectomy at [facility] on March 11, 2016.  CSN# [id number]   Victoria Moore J. Florene Glen, PA-C  for Dr. Seymour Bars. [place]

## 2016-03-01 NOTE — Progress Notes (Signed)
This encounter was created in error - please disregard.

## 2016-03-02 ENCOUNTER — Encounter (HOSPITAL_COMMUNITY)
Admission: RE | Admit: 2016-03-02 | Discharge: 2016-03-02 | Disposition: A | Discharge status: Home / Self Care | Payer: BLUE CROSS/BLUE SHIELD | Source: Ambulatory Visit | Attending: Obstetrics and Gynecology | Admitting: Obstetrics and Gynecology

## 2016-03-02 ENCOUNTER — Encounter (HOSPITAL_COMMUNITY): Payer: Self-pay

## 2016-03-02 DIAGNOSIS — Z01812 Encounter for preprocedural laboratory examination: Secondary | ICD-10-CM | POA: Insufficient documentation

## 2016-03-02 HISTORY — DX: Gastro-esophageal reflux disease without esophagitis: K21.9

## 2016-03-02 NOTE — Patient Instructions (Addendum)
   Your procedure is scheduled on: MARCH 16 (THURSDAY)  Enter through the Main Entrance of [facility] at: [place] up the phone at the desk and dial [phone] and inform us of your arrival.  Please call this number if you have any problems the morning of surgery: [phone]  DO NOT EAT OR DRINK AFTER MIDNIGHT MARCH 15 [facility])  Take these medicines the morning of surgery with a SIP OF WATER: TAKE DILANTIN AT BEDTIME.Marland Kitchen TAKE NEXIUM DAY OF SURGERY..  Do not wear jewelry, make-up, or FINGER nail polish No metal in your hair or on your body. Do not wear lotions, powders, perfumes.  You may wear deodorant.  Do not bring valuables to the hospital. Contacts, dentures or bridgework may not be worn into surgery.  Leave suitcase in the car. After Surgery it may be brought to your room. For patients being admitted to the hospital, checkout time is 11:00am the day of discharge.

## 2016-03-03 ENCOUNTER — Encounter: Payer: Self-pay | Admitting: Physical Therapy

## 2016-03-03 ENCOUNTER — Ambulatory Visit: Payer: BLUE CROSS/BLUE SHIELD | Admitting: Physical Therapy

## 2016-03-03 DIAGNOSIS — M5441 Lumbago with sciatica, right side: Secondary | ICD-10-CM | POA: Diagnosis present

## 2016-03-03 DIAGNOSIS — R293 Abnormal posture: Secondary | ICD-10-CM

## 2016-03-03 DIAGNOSIS — M62830 Muscle spasm of back: Secondary | ICD-10-CM

## 2016-03-03 DIAGNOSIS — R6889 Other general symptoms and signs: Secondary | ICD-10-CM | POA: Diagnosis present

## 2016-03-03 NOTE — Therapy (Addendum)
[place], [place], [address] Phone: [phone]   Fax:  [phone]  Physical Therapy Evaluation / Discharge  Patient Details  Name: Victoria Moore MRN: [id number] Date of Birth: Feb 12, 1976 Referring Provider: Jonathon Jordan MD  Encounter Date: 03/03/2016      PT End of Session - 03/03/16 1622    Visit Number 1   Number of Visits 16   Date for PT Re-Evaluation 04/28/16   PT Start Time 1950   PT Stop Time 9326   PT Time Calculation (min) 56 min   Activity Tolerance Patient tolerated treatment well;Patient limited by pain;Patient limited by fatigue   Behavior During Therapy [facility] for tasks assessed/performed      Past Medical History  Diagnosis Date  . Anemia   . Sarcoidosis ([place])   . GERD (gastroesophageal reflux disease)   . Seizures ([place])     last one year ago  . Anxiety     Past Surgical History  Procedure Laterality Date  . Tubal ligation      There were no vitals filed for this visit.  Visit Diagnosis:  Right-sided low back pain with right-sided sciatica - Plan: PT plan of care cert/re-cert  Abnormal posture - Plan: PT plan of care cert/re-cert  Activity intolerance - Plan: PT plan of care cert/re-cert  Muscle spasm of back - Plan: PT plan of care cert/re-cert      Subjective Assessment - 03/03/16 1558    Subjective pt is a 40 y.o F with CC of low back that has been going on for 3 weeks that started insidiously about 3 weeks ago. She reports pain in R LE that started last night and hasn't stopped with shooting throbbing. she reports having no issues in the back prior to 3 weeks ago. since onset she reports the pain getting worse with increased referral of pain down the RLE .   Limitations House hold activities;Sitting;Lifting;Standing;Walking   How long can you sit comfortably? 10 min   How long can you stand comfortably? 10 min   How long can you walk comfortably? 10 min    Diagnostic tests x-ray 02/19/2016 no abnormal findings   Patient Stated Goals to decrease low back pain, and return to work.    Currently in Pain? Yes   Pain Score 10-Worst pain ever   Pain Location Back   Pain Orientation Right;Lower   Pain Descriptors / Indicators Aching;Burning;Shooting;Sore;Tightness   Pain Type --  sub-acute   Pain Radiating Towards to the back of the R calf   Pain Onset 1 to 4 weeks ago   Pain Frequency Constant   Aggravating Factors  prolonged standing, sitting, twisting, bending, breath   Pain Relieving Factors nothing seems to help            [facility] PT Assessment - 03/03/16 1604    Assessment   Medical Diagnosis low back pain   Referring Provider Jonathon Jordan MD   Onset Date/Surgical Date --  3 weeks   Hand Dominance Right   Next MD Visit 03/03/2016   Prior Therapy no   Precautions   Precautions None   Restrictions   Weight Bearing Restrictions No   Balance Screen   Has the patient fallen in the past 6 months No   Has the patient had a decrease in activity level because of a fear of falling?  No   Is the patient reluctant to leave their home because of a fear of falling?  No   Home Environment   Living Environment Private residence   Living Arrangements Spouse/significant other   Available Help at Discharge Available PRN/intermittently   Type of Home House   Home Access Stairs to enter   Entrance Stairs-Number of Steps 5   Home Layout One level   Prior Function   Level of Independence Independent;Independent with basic ADLs   Vocation Full time employment  waitress, serving, cooking, prep   Vocation Requirements prolonged, standing, walking, lifting, twisting, carrying,    Cognition   Overall Cognitive Status Within Functional Limits for tasks assessed   Observation/Other Assessments   Focus on Therapeutic Outcomes (FOTO)  84% limited  49% limited   Posture/Postural Control   Posture/Postural Control Postural limitations   Postural  Limitations Rounded Shoulders;Forward head;Increased thoracic kyphosis   Posture Comments L lateral shift with standing   ROM / Strength   AROM / PROM / Strength AROM;Strength;PROM   AROM   AROM Assessment Site Lumbar   Lumbar Flexion 32   Lumbar Extension 10   Lumbar - Right Side Bend 10   Lumbar - Left Side Bend 14   Palpation   Palpation comment tenderness in the R lumbar paraspinal with spasm noted into the lower thoracic region.    Special Tests    Special Tests Lumbar   Lumbar Tests Slump Test;Straight Leg Raise;Prone Knee Bend Test   Slump test   Findings Unable to test   Prone Knee Bend Test   Findings Unable to test   Straight Leg Raise   Findings Unable to test   Ambulation/Gait   Gait Pattern Step-to pattern;Decreased stride length;Decreased arm swing - right;Decreased arm swing - left;Trunk flexed;Lateral trunk lean to left;Antalgic;Shuffle                   [facility] Adult PT Treatment/Exercise - 03/03/16 1604    Modalities   Modalities Moist Heat;Electrical Stimulation   Moist Heat Therapy   Number Minutes Moist Heat 15 Minutes   Moist Heat Location Lumbar Spine  prone   Electrical Stimulation   Electrical Stimulation Location R low back   Electrical Stimulation Action IFC   Electrical Stimulation Parameters L 15, 100% scan, x 15 min   Electrical Stimulation Goals Pain                PT Education - 03/03/16 1743    Education provided Yes   Education Details evaluation findings, POC, Goals, HEP   Person(s) Educated Patient   Methods Explanation   Comprehension Verbalized understanding          PT Short Term Goals - 03/03/16 1755    PT SHORT TERM GOAL #1   Title pt will be I with inital HEP (04/03/2016)   Time 4   Period Weeks   Status New   PT SHORT TERM GOAL #2   Title pt will be able to verbalize proper posture and lifting/ carrying mechanics to reduce low back pain and reinjury (04/03/2016)   Time 4   Period Weeks   Status New    PT SHORT TERM GOAL #3   Title pt will improve her trunk extension and R side bending by >/= 5 degrees with </= 7/10 pain to assist with ADLs (04/28/2016)   Time 4   Period Weeks   Status New   PT SHORT TERM GOAL #4   Title she will improve her FOTO score by >/= 10 points to demonstrate improvement in function (04/03/2016)   Time  4   Period Weeks   Status New   PT SHORT TERM GOAL #5   Title she will be able to walk/ stand >/= 13 minutes with </= 5/10 pain to assist with work related tasks (04/03/2016)   Time 4   Period Weeks   Status New           PT Long Term Goals - 03/03/16 1759    PT LONG TERM GOAL #1   Title pt will be I with all HEP as of last visit (04/28/2016)   Time 8   Period Weeks   Status New   PT LONG TERM GOAL #2   Title pt will improve  trunk mobility by >/= 10 degrees in all planes with </= 4/10 pain to help with job related tasks and ADLS (07/01/7208)   Time 8   Period Weeks   Status New   PT LONG TERM GOAL #3   Title She will be able to stand / walk for >/= 30 min with </= 4/10 pain with promote functional endurance with work and ADLS (04/02/961)   Time 8   Period Weeks   Status New   PT LONG TERM GOAL #4   Title She will improve her FOTO score to >/= 53 at discharge to demonstrate improvement in function (04/28/2016)   Time [place] - 03/03/16 1743    Clinical Impression Statement Victoria Moore presents to OPPT as a moderate complexity evaluation with CC of low back pain that started 3 weeks ago with recent exacerbation of referral of pain to the R LE. Todays session was limited due to pt having lots of pain and limited tolerance with any position. Measurements that were attained she demonstrates limited trunk mobility in all planes with ERP in all planes especially to the R side bending, and referral of pain with flexion. Visual inspection reveals L lateral shifting with pt unable to stand up straight. performed MHP and  e-stim to calm down pt pain with her in prone which she reported some relief. She reported she is seeing her doctor today at 4:45 due to having more pain. Plan to get more assessment next visit if possible. she would beneift from physical therapy to reduce low back pain, improve trunk mobility, posture and maximize her function by addressing the impairments listed.    Pt will benefit from skilled therapeutic intervention in order to improve on the following deficits Pain;Improper body mechanics;Postural dysfunction;Hypomobility;Increased muscle spasms;Increased fascial restricitons;Decreased range of motion;Difficulty walking;Decreased endurance;Decreased activity tolerance;Decreased balance;Impaired flexibility   Rehab Potential Good   PT Frequency 2x / week   PT Duration 8 weeks   PT Treatment/Interventions ADLs/Self Care Home Management;Cryotherapy;Electrical Stimulation;Therapeutic activities;Iontophoresis '4mg'$ /ml Dexamethasone;Moist Heat;Therapeutic exercise;Manual techniques;Dry needling;Passive range of motion;Ultrasound;Patient/family education;Taping;Traction   PT Next Visit Plan assess/ review HEP, special testing of the lumbar spine, modalities for pain, prone on elbows, work to correct possible L lateral shift   PT Home Exercise Plan prone on elbows, pelvic tlit, lower trunk rotation.    Consulted and Agree with Plan of Care Patient         Problem List Patient Active Problem List   Diagnosis Date Noted  . Anemia 11/12/2014  . Symptomatic anemia 11/12/2014  . Menorrhagia 11/12/2014  . Iron deficiency 11/12/2014  . Right-sided chest pain 11/12/2014   Starr Lake PT, DPT, LAT, ATC  03/03/2016  6:10  PM     [place], [place], [address] Phone: [phone]   Fax:  [phone]  Name: Victoria Moore MRN: [id number] Date of Birth: [birth year]   PHYSICAL THERAPY DISCHARGE SUMMARY  Visits from  Start of Care: 1  Current functional level related to goals / functional outcomes: See goals   Remaining deficits: unknown   Education / Equipment: HEP  Plan:                                                    Patient goals were not met. Patient is being discharged due to not returning since the last visit.  ?????     Vernie Vinciguerra PT, DPT, LAT, ATC  08/03/16  2:24 PM

## 2016-03-05 ENCOUNTER — Ambulatory Visit
Admission: RE | Admit: 2016-03-05 | Discharge: 2016-03-05 | Disposition: A | Discharge status: Home / Self Care | Payer: BLUE CROSS/BLUE SHIELD | Source: Ambulatory Visit | Attending: Obstetrics and Gynecology | Admitting: Obstetrics and Gynecology

## 2016-03-05 DIAGNOSIS — R928 Other abnormal and inconclusive findings on diagnostic imaging of breast: Secondary | ICD-10-CM

## 2016-03-10 MED ORDER — DEXTROSE 5 % IV SOLN
2.0000 g | INTRAVENOUS | Status: AC
  Administered 2016-03-11: 2 g via INTRAVENOUS
  Filled 2016-03-10: qty 2

## 2016-03-11 ENCOUNTER — Ambulatory Visit (HOSPITAL_COMMUNITY): Payer: BLUE CROSS/BLUE SHIELD | Admitting: Anesthesiology

## 2016-03-11 ENCOUNTER — Observation Stay (HOSPITAL_COMMUNITY)
Admission: RE | Admit: 2016-03-11 | Discharge: 2016-03-12 | Disposition: A | Discharge status: Home / Self Care | Payer: BLUE CROSS/BLUE SHIELD | Source: Ambulatory Visit | Attending: Obstetrics and Gynecology | Admitting: Obstetrics and Gynecology

## 2016-03-11 ENCOUNTER — Encounter (HOSPITAL_COMMUNITY): Payer: Self-pay | Admitting: Anesthesiology

## 2016-03-11 ENCOUNTER — Encounter (HOSPITAL_COMMUNITY)
Admission: RE | Disposition: A | Discharge status: Home / Self Care | Payer: Self-pay | Source: Ambulatory Visit | Attending: Obstetrics and Gynecology

## 2016-03-11 DIAGNOSIS — F419 Anxiety disorder, unspecified: Secondary | ICD-10-CM | POA: Insufficient documentation

## 2016-03-11 DIAGNOSIS — D219 Benign neoplasm of connective and other soft tissue, unspecified: Secondary | ICD-10-CM

## 2016-03-11 DIAGNOSIS — D251 Intramural leiomyoma of uterus: Principal | ICD-10-CM | POA: Insufficient documentation

## 2016-03-11 DIAGNOSIS — N946 Dysmenorrhea, unspecified: Secondary | ICD-10-CM | POA: Insufficient documentation

## 2016-03-11 DIAGNOSIS — K219 Gastro-esophageal reflux disease without esophagitis: Secondary | ICD-10-CM | POA: Diagnosis not present

## 2016-03-11 DIAGNOSIS — D649 Anemia, unspecified: Secondary | ICD-10-CM | POA: Diagnosis not present

## 2016-03-11 DIAGNOSIS — D259 Leiomyoma of uterus, unspecified: Secondary | ICD-10-CM | POA: Diagnosis present

## 2016-03-11 DIAGNOSIS — R569 Unspecified convulsions: Secondary | ICD-10-CM | POA: Insufficient documentation

## 2016-03-11 DIAGNOSIS — D252 Subserosal leiomyoma of uterus: Secondary | ICD-10-CM | POA: Diagnosis not present

## 2016-03-11 DIAGNOSIS — N939 Abnormal uterine and vaginal bleeding, unspecified: Secondary | ICD-10-CM | POA: Diagnosis not present

## 2016-03-11 DIAGNOSIS — N920 Excessive and frequent menstruation with regular cycle: Secondary | ICD-10-CM | POA: Diagnosis not present

## 2016-03-11 DIAGNOSIS — N8302 Follicular cyst of left ovary: Secondary | ICD-10-CM | POA: Diagnosis not present

## 2016-03-11 DIAGNOSIS — Z87891 Personal history of nicotine dependence: Secondary | ICD-10-CM | POA: Diagnosis not present

## 2016-03-11 HISTORY — PX: LAPAROSCOPIC OVARIAN CYSTECTOMY: SHX6248

## 2016-03-11 HISTORY — PX: LAPAROSCOPIC VAGINAL HYSTERECTOMY WITH SALPINGECTOMY: SHX6680

## 2016-03-11 HISTORY — DX: Benign neoplasm of connective and other soft tissue, unspecified: D21.9

## 2016-03-11 LAB — COMPREHENSIVE METABOLIC PANEL
ALT: 16 U/L (ref 14–54)
AST: 21 U/L (ref 15–41)
Albumin: 3.9 g/dL (ref 3.5–5.0)
Alkaline Phosphatase: 48 U/L (ref 38–126)
Anion gap: 4 — ABNORMAL LOW (ref 5–15)
BUN: 13 mg/dL (ref 6–20)
CO2: 27 mmol/L (ref 22–32)
Calcium: 8.6 mg/dL — ABNORMAL LOW (ref 8.9–10.3)
Chloride: 106 mmol/L (ref 101–111)
Creatinine, Ser: 0.87 mg/dL (ref 0.44–1.00)
GFR calc Af Amer: >60 mL/min (ref >60)
GFR calc non Af Amer: >60 mL/min (ref >60)
Glucose, Bld: 85 mg/dL (ref 65–99)
Potassium: 4 mmol/L (ref 3.5–5.1)
Sodium: 137 mmol/L (ref 135–145)
Total Bilirubin: 0.3 mg/dL (ref 0.3–1.2)
Total Protein: 7.5 g/dL (ref 6.5–8.1)

## 2016-03-11 LAB — CBC
HCT: 34.1 % — ABNORMAL LOW (ref 36.0–46.0)
Hemoglobin: 10.4 g/dL — ABNORMAL LOW (ref 12.0–15.0)
MCH: 22.9 pg — ABNORMAL LOW (ref 26.0–34.0)
MCHC: 30.5 g/dL (ref 30.0–36.0)
MCV: 75.1 fL — ABNORMAL LOW (ref 78.0–100.0)
Platelets: 204 10*3/uL (ref 150–400)
RBC: 4.54 MIL/uL (ref 3.87–5.11)
RDW: 22.7 % — ABNORMAL HIGH (ref 11.5–15.5)
WBC: 5.6 10*3/uL (ref 4.0–10.5)

## 2016-03-11 LAB — ABO/RH: ABO/RH(D): A POS

## 2016-03-11 LAB — TYPE AND SCREEN
ABO/RH(D): A POS
Antibody Screen: NEGATIVE

## 2016-03-11 LAB — PHENYTOIN LEVEL, TOTAL: Phenytoin Lvl: <2.5 ug/mL — ABNORMAL LOW (ref 10.0–20.0)

## 2016-03-11 LAB — PREGNANCY, URINE: Preg Test, Ur: NEGATIVE

## 2016-03-11 SURGERY — HYSTERECTOMY, VAGINAL, LAPAROSCOPY-ASSISTED, WITH SALPINGECTOMY
Anesthesia: General | Site: Abdomen | Laterality: Left

## 2016-03-11 MED ORDER — LACTATED RINGERS IV SOLN
INTRAVENOUS | Status: DC
  Administered 2016-03-11 (×2): via INTRAVENOUS

## 2016-03-11 MED ORDER — NALOXONE HCL 0.4 MG/ML IJ SOLN: 0.4000 mg | INTRAMUSCULAR | Status: DC | PRN

## 2016-03-11 MED ORDER — ONDANSETRON HCL 4 MG/2ML IJ SOLN
INTRAMUSCULAR | Status: AC
  Filled 2016-03-11: qty 2

## 2016-03-11 MED ORDER — FENTANYL CITRATE (PF) 250 MCG/5ML IJ SOLN
INTRAMUSCULAR | Status: AC
  Filled 2016-03-11: qty 5

## 2016-03-11 MED ORDER — MOMETASONE FURO-FORMOTEROL FUM 100-5 MCG/ACT IN AERO
2.0000 | INHALATION_SPRAY | Freq: Two times a day (BID) | RESPIRATORY_TRACT | Status: DC
  Administered 2016-03-11 – 2016-03-12 (×2): 2 via RESPIRATORY_TRACT
  Filled 2016-03-11: qty 8.8

## 2016-03-11 MED ORDER — SODIUM CHLORIDE 0.9 % IJ SOLN
INTRAMUSCULAR | Status: AC
  Filled 2016-03-11: qty 50

## 2016-03-11 MED ORDER — PROPOFOL 10 MG/ML IV BOLUS
INTRAVENOUS | Status: DC | PRN
  Administered 2016-03-11: 150 mg via INTRAVENOUS

## 2016-03-11 MED ORDER — KETOROLAC TROMETHAMINE 30 MG/ML IJ SOLN
INTRAMUSCULAR | Status: DC | PRN
  Administered 2016-03-11: 30 mg via INTRAVENOUS

## 2016-03-11 MED ORDER — KETOROLAC TROMETHAMINE 30 MG/ML IJ SOLN
30.0000 mg | Freq: Four times a day (QID) | INTRAMUSCULAR | Status: DC
  Administered 2016-03-11: 30 mg via INTRAVENOUS
  Filled 2016-03-11: qty 1

## 2016-03-11 MED ORDER — ACETAMINOPHEN 325 MG PO TABS: 650.0000 mg | ORAL_TABLET | ORAL | Status: DC | PRN

## 2016-03-11 MED ORDER — DEXAMETHASONE SODIUM PHOSPHATE 4 MG/ML IJ SOLN
INTRAMUSCULAR | Status: DC | PRN
  Administered 2016-03-11: 10 mg via INTRAVENOUS

## 2016-03-11 MED ORDER — PROPOFOL 10 MG/ML IV BOLUS
INTRAVENOUS | Status: AC
  Filled 2016-03-11: qty 20

## 2016-03-11 MED ORDER — DEXTROSE 5 % IV SOLN
500.0000 mg | Freq: Once | INTRAVENOUS | Status: AC
  Administered 2016-03-11: 500 mg via INTRAVENOUS
  Filled 2016-03-11: qty 5

## 2016-03-11 MED ORDER — HYDROMORPHONE 1 MG/ML IV SOLN
INTRAVENOUS | Status: DC
  Administered 2016-03-11: 17:00:00 via INTRAVENOUS
  Administered 2016-03-11: 4.2 mg via INTRAVENOUS
  Administered 2016-03-12: 1.2 mg via INTRAVENOUS
  Filled 2016-03-11: qty 25

## 2016-03-11 MED ORDER — MIDAZOLAM HCL 2 MG/2ML IJ SOLN
INTRAMUSCULAR | Status: DC | PRN
  Administered 2016-03-11: 2 mg via INTRAVENOUS

## 2016-03-11 MED ORDER — ONDANSETRON HCL 4 MG PO TABS: 4.0000 mg | ORAL_TABLET | Freq: Four times a day (QID) | ORAL | Status: DC | PRN

## 2016-03-11 MED ORDER — FENTANYL CITRATE (PF) 250 MCG/5ML IJ SOLN
INTRAMUSCULAR | Status: DC | PRN
  Administered 2016-03-11: 100 ug via INTRAVENOUS
  Administered 2016-03-11: 50 ug via INTRAVENOUS
  Administered 2016-03-11: 100 ug via INTRAVENOUS
  Administered 2016-03-11 (×2): 50 ug via INTRAVENOUS

## 2016-03-11 MED ORDER — ROCURONIUM BROMIDE 100 MG/10ML IV SOLN
INTRAVENOUS | Status: DC | PRN
  Administered 2016-03-11: 10 mg via INTRAVENOUS
  Administered 2016-03-11: 5 mg via INTRAVENOUS
  Administered 2016-03-11: 35 mg via INTRAVENOUS
  Administered 2016-03-11: 5 mg via INTRAVENOUS
  Administered 2016-03-11: 10 mg via INTRAVENOUS
  Administered 2016-03-11: 5 mg via INTRAVENOUS

## 2016-03-11 MED ORDER — ROCURONIUM BROMIDE 100 MG/10ML IV SOLN
INTRAVENOUS | Status: AC
  Filled 2016-03-11: qty 1

## 2016-03-11 MED ORDER — BUPIVACAINE HCL (PF) 0.25 % IJ SOLN
INTRAMUSCULAR | Status: DC | PRN
  Administered 2016-03-11 (×2): 10 mL

## 2016-03-11 MED ORDER — ONDANSETRON HCL 4 MG/2ML IJ SOLN: 4.0000 mg | Freq: Four times a day (QID) | INTRAMUSCULAR | Status: DC | PRN

## 2016-03-11 MED ORDER — HYDRALAZINE HCL 20 MG/ML IJ SOLN
INTRAMUSCULAR | Status: AC
  Filled 2016-03-11: qty 1

## 2016-03-11 MED ORDER — GLYCOPYRROLATE 0.2 MG/ML IJ SOLN
INTRAMUSCULAR | Status: DC | PRN
  Administered 2016-03-11: 0.1 mg via INTRAVENOUS
  Administered 2016-03-11: .9 mg via INTRAVENOUS

## 2016-03-11 MED ORDER — OXYCODONE-ACETAMINOPHEN 5-325 MG PO TABS
1.0000 | ORAL_TABLET | ORAL | Status: DC | PRN
  Administered 2016-03-12 (×2): 2 via ORAL
  Filled 2016-03-11 (×2): qty 2

## 2016-03-11 MED ORDER — FENTANYL CITRATE (PF) 100 MCG/2ML IJ SOLN
INTRAMUSCULAR | Status: AC
  Filled 2016-03-11: qty 4

## 2016-03-11 MED ORDER — HYDRALAZINE HCL 20 MG/ML IJ SOLN
5.0000 mg | Freq: Once | INTRAMUSCULAR | Status: AC
  Administered 2016-03-11: 5 mg via INTRAVENOUS

## 2016-03-11 MED ORDER — NEOSTIGMINE METHYLSULFATE 10 MG/10ML IV SOLN
INTRAVENOUS | Status: AC
  Filled 2016-03-11: qty 1

## 2016-03-11 MED ORDER — MENTHOL 3 MG MT LOZG: 1.0000 | LOZENGE | OROMUCOSAL | Status: DC | PRN

## 2016-03-11 MED ORDER — LIDOCAINE HCL (CARDIAC) 20 MG/ML IV SOLN
INTRAVENOUS | Status: DC | PRN
  Administered 2016-03-11: 80 mg via INTRAVENOUS

## 2016-03-11 MED ORDER — NEOSTIGMINE METHYLSULFATE 10 MG/10ML IV SOLN
INTRAVENOUS | Status: DC | PRN
  Administered 2016-03-11: 5 mg via INTRAVENOUS

## 2016-03-11 MED ORDER — SCOPOLAMINE 1 MG/3DAYS TD PT72
MEDICATED_PATCH | TRANSDERMAL | Status: AC
  Administered 2016-03-11: 1.5 mg via TRANSDERMAL
  Filled 2016-03-11: qty 1

## 2016-03-11 MED ORDER — LABETALOL HCL 5 MG/ML IV SOLN
INTRAVENOUS | Status: DC | PRN
  Administered 2016-03-11: 10 mg via INTRAVENOUS

## 2016-03-11 MED ORDER — BUPIVACAINE HCL (PF) 0.25 % IJ SOLN
INTRAMUSCULAR | Status: AC
  Filled 2016-03-11: qty 30

## 2016-03-11 MED ORDER — VASOPRESSIN 20 UNIT/ML IV SOLN
INTRAVENOUS | Status: AC
  Filled 2016-03-11: qty 1

## 2016-03-11 MED ORDER — SODIUM CHLORIDE 0.9% FLUSH: 9.0000 mL | INTRAVENOUS | Status: DC | PRN

## 2016-03-11 MED ORDER — VASOPRESSIN 20 UNIT/ML IV SOLN
INTRAVENOUS | Status: DC | PRN
  Administered 2016-03-11: 50 mL via INTRAMUSCULAR

## 2016-03-11 MED ORDER — DEXAMETHASONE SODIUM PHOSPHATE 10 MG/ML IJ SOLN
INTRAMUSCULAR | Status: AC
  Filled 2016-03-11: qty 1

## 2016-03-11 MED ORDER — LIDOCAINE HCL (CARDIAC) 20 MG/ML IV SOLN
INTRAVENOUS | Status: AC
  Filled 2016-03-11: qty 5

## 2016-03-11 MED ORDER — KETOROLAC TROMETHAMINE 30 MG/ML IJ SOLN
INTRAMUSCULAR | Status: AC
  Filled 2016-03-11: qty 2

## 2016-03-11 MED ORDER — DIPHENHYDRAMINE HCL 50 MG/ML IJ SOLN: 12.5000 mg | Freq: Four times a day (QID) | INTRAMUSCULAR | Status: DC | PRN

## 2016-03-11 MED ORDER — HYDROMORPHONE HCL 1 MG/ML IJ SOLN: 0.2500 mg | INTRAMUSCULAR | Status: DC | PRN

## 2016-03-11 MED ORDER — LABETALOL HCL 5 MG/ML IV SOLN
INTRAVENOUS | Status: AC
  Filled 2016-03-11: qty 4

## 2016-03-11 MED ORDER — ESTRADIOL 0.1 MG/GM VA CREA
TOPICAL_CREAM | VAGINAL | Status: AC
Start: 2016-03-11 — End: 2016-03-11
  Filled 2016-03-11: qty 42.5

## 2016-03-11 MED ORDER — ALPRAZOLAM 0.5 MG PO TABS
1.0000 mg | ORAL_TABLET | Freq: Every day | ORAL | Status: DC
  Administered 2016-03-11 – 2016-03-12 (×2): 1 mg via ORAL
  Filled 2016-03-11 (×2): qty 2

## 2016-03-11 MED ORDER — PANTOPRAZOLE SODIUM 40 MG PO TBEC: 40.0000 mg | DELAYED_RELEASE_TABLET | Freq: Every day | ORAL | Status: DC | PRN

## 2016-03-11 MED ORDER — MIDAZOLAM HCL 2 MG/2ML IJ SOLN
INTRAMUSCULAR | Status: AC
  Filled 2016-03-11: qty 2

## 2016-03-11 MED ORDER — LACTATED RINGERS IV SOLN
INTRAVENOUS | Status: DC
  Administered 2016-03-11 (×4): via INTRAVENOUS

## 2016-03-11 MED ORDER — DIPHENHYDRAMINE HCL 12.5 MG/5ML PO ELIX: 12.5000 mg | ORAL_SOLUTION | Freq: Four times a day (QID) | ORAL | Status: DC | PRN

## 2016-03-11 MED ORDER — HYDROMORPHONE HCL 1 MG/ML IJ SOLN
INTRAMUSCULAR | Status: DC | PRN
  Administered 2016-03-11: 1 mg via INTRAVENOUS

## 2016-03-11 MED ORDER — SIMETHICONE 80 MG PO CHEW: 80.0000 mg | CHEWABLE_TABLET | Freq: Four times a day (QID) | ORAL | Status: DC | PRN

## 2016-03-11 MED ORDER — PROMETHAZINE HCL 25 MG/ML IJ SOLN: 6.2500 mg | INTRAMUSCULAR | Status: DC | PRN

## 2016-03-11 MED ORDER — SCOPOLAMINE 1 MG/3DAYS TD PT72
1.0000 | MEDICATED_PATCH | Freq: Once | TRANSDERMAL | Status: DC
  Administered 2016-03-11: 1.5 mg via TRANSDERMAL

## 2016-03-11 MED ORDER — HYDROMORPHONE HCL 1 MG/ML IJ SOLN
INTRAMUSCULAR | Status: AC
  Filled 2016-03-11: qty 2

## 2016-03-11 MED ORDER — GLYCOPYRROLATE 0.2 MG/ML IJ SOLN
INTRAMUSCULAR | Status: AC
  Filled 2016-03-11: qty 3

## 2016-03-11 MED ORDER — ONDANSETRON HCL 4 MG/2ML IJ SOLN
INTRAMUSCULAR | Status: DC | PRN
  Administered 2016-03-11: 4 mg via INTRAVENOUS

## 2016-03-11 SURGICAL SUPPLY — 63 items
CABLE HIGH FREQUENCY MONO STRZ (ELECTRODE) IMPLANT
CATH ROBINSON RED A/P 16FR (CATHETERS) IMPLANT
CHLORAPREP W/TINT 26ML (MISCELLANEOUS) IMPLANT
CLOTH BEACON ORANGE TIMEOUT ST (SAFETY) ×4 IMPLANT
CONT PATH 16OZ SNAP LID 3702 (MISCELLANEOUS) ×4 IMPLANT
CONTAINER PREFILL 10% NBF 60ML (FORM) ×4 IMPLANT
COVER BACK TABLE 60X90IN (DRAPES) ×4 IMPLANT
COVER LIGHT HANDLE  1/PK (MISCELLANEOUS) ×1
COVER LIGHT HANDLE 1/PK (MISCELLANEOUS) ×3 IMPLANT
DECANTER SPIKE VIAL GLASS SM (MISCELLANEOUS) ×8 IMPLANT
DRAPE SHEET LG 3/4 BI-LAMINATE (DRAPES) ×8 IMPLANT
DRSG COVADERM PLUS 2X2 (GAUZE/BANDAGES/DRESSINGS) IMPLANT
DRSG OPSITE POSTOP 3X4 (GAUZE/BANDAGES/DRESSINGS) ×4 IMPLANT
DRSG TELFA 3X8 NADH (GAUZE/BANDAGES/DRESSINGS) ×4 IMPLANT
DURAPREP 26ML APPLICATOR (WOUND CARE) ×4 IMPLANT
ELECT REM PT RETURN 9FT ADLT (ELECTROSURGICAL) ×4
ELECTRODE REM PT RTRN 9FT ADLT (ELECTROSURGICAL) ×3 IMPLANT
EVACUATOR SMOKE 8.L (FILTER) IMPLANT
FORCEPS CUTTING 33CM 5MM (CUTTING FORCEPS) IMPLANT
FORCEPS CUTTING 45CM 5MM (CUTTING FORCEPS) IMPLANT
GAUZE PACKING 2X5 YD STRL (GAUZE/BANDAGES/DRESSINGS) IMPLANT
GLOVE BIOGEL PI IND STRL 6.5 (GLOVE) ×3 IMPLANT
GLOVE BIOGEL PI IND STRL 7.0 (GLOVE) ×9 IMPLANT
GLOVE BIOGEL PI INDICATOR 6.5 (GLOVE) ×1
GLOVE BIOGEL PI INDICATOR 7.0 (GLOVE) ×3
GLOVE SURG SS PI 6.5 STRL IVOR (GLOVE) ×12 IMPLANT
GOWN STRL REUS W/TWL LRG LVL3 (GOWN DISPOSABLE) IMPLANT
LEGGING LITHOTOMY PAIR STRL (DRAPES) ×4 IMPLANT
LIQUID BAND (GAUZE/BANDAGES/DRESSINGS) ×4 IMPLANT
NEEDLE MAYO CATGUT SZ4 (NEEDLE) ×8 IMPLANT
NS IRRIG 1000ML POUR BTL (IV SOLUTION) ×4 IMPLANT
PACK LAPAROSCOPY BASIN (CUSTOM PROCEDURE TRAY) IMPLANT
PACK LAVH (CUSTOM PROCEDURE TRAY) ×4 IMPLANT
PACK ROBOTIC GOWN (GOWN DISPOSABLE) ×4 IMPLANT
PAD MAGNETIC INST (MISCELLANEOUS) ×4 IMPLANT
PAD TRENDELENBURG POSITION (MISCELLANEOUS) ×4 IMPLANT
POUCH SPECIMEN RETRIEVAL 10MM (ENDOMECHANICALS) IMPLANT
SET IRRIG TUBING LAPAROSCOPIC (IRRIGATION / IRRIGATOR) IMPLANT
SHEARS HARMONIC ACE PLUS 36CM (ENDOMECHANICALS) ×4 IMPLANT
SLEEVE XCEL OPT CAN 5 100 (ENDOMECHANICALS) ×4 IMPLANT
SOLUTION ELECTROLUBE (MISCELLANEOUS) IMPLANT
STRIP CLOSURE SKIN 1/4X3 (GAUZE/BANDAGES/DRESSINGS) IMPLANT
SUT CHROMIC 2 0 TIES 18 (SUTURE) IMPLANT
SUT MNCRL AB 3-0 PS2 27 (SUTURE) ×4 IMPLANT
SUT MNCRL AB 4-0 PS2 18 (SUTURE) IMPLANT
SUT VIC AB 0 CT1 18XCR BRD8 (SUTURE) ×9 IMPLANT
SUT VIC AB 0 CT1 27 (SUTURE)
SUT VIC AB 0 CT1 27XCR 8 STRN (SUTURE) IMPLANT
SUT VIC AB 0 CT1 8-18 (SUTURE) ×3
SUT VIC AB 3-0 PS2 18 (SUTURE) ×1
SUT VIC AB 3-0 PS2 18XBRD (SUTURE) ×3 IMPLANT
SUT VICRYL 0 ENDOLOOP (SUTURE) IMPLANT
SUT VICRYL 0 TIES 12 18 (SUTURE) ×4 IMPLANT
SUT VICRYL 0 UR6 27IN ABS (SUTURE) ×8 IMPLANT
SYR 20CC LL (SYRINGE) ×4 IMPLANT
SYR 50ML LL SCALE MARK (SYRINGE) ×4 IMPLANT
SYR TB 1ML LUER SLIP (SYRINGE) ×4 IMPLANT
TOWEL OR 17X24 6PK STRL BLUE (TOWEL DISPOSABLE) ×8 IMPLANT
TRAY FOLEY CATH SILVER 14FR (SET/KITS/TRAYS/PACK) ×4 IMPLANT
TROCAR BALL TOP DISP 5MM (ENDOMECHANICALS) ×4 IMPLANT
TROCAR XCEL DIL TIP R 11M (ENDOMECHANICALS) ×4 IMPLANT
WARMER LAPAROSCOPE (MISCELLANEOUS) ×4 IMPLANT
WATER STERILE IRR 1000ML POUR (IV SOLUTION) IMPLANT

## 2016-03-11 NOTE — Anesthesia Procedure Notes (Signed)
Procedure Name: Intubation Date/Time: 03/11/2016 10:36 AM Performed by: Flossie Dibble Pre-anesthesia Checklist: Patient being monitored, Suction available, Emergency Drugs available, Patient identified and Timeout performed Patient Re-evaluated:Patient Re-evaluated prior to inductionOxygen Delivery Method: Circle system utilized Preoxygenation: Pre-oxygenation with 100% oxygen Intubation Type: IV induction Ventilation: Mask ventilation without difficulty Laryngoscope Size: Glidescope (Lo Pro # 3) Grade View: Grade I Tube type: Oral Number of attempts: 1 (By D.Singer MD) Airway Equipment and Method: Patient positioned with wedge pillow,  Stylet and Video-laryngoscopy Secured at: 21 cm Tube secured with: Tape Dental Injury: Teeth and Oropharynx as per pre-operative assessment

## 2016-03-11 NOTE — Progress Notes (Signed)
Day of Surgery Procedure(s) (LRB): LAPAROSCOPIC ASSISTED VAGINAL HYSTERECTOMY WITH SALPINGECTOMY (Bilateral) LAPAROSCOPIC OVARIAN CYSTECTOMY (Left)  Subjective: Patient reports nausea, vomiting, has  incisional pain and using PCA and tolerating PO.    Objective: Temp:  [96.8 F (36 C)-98.6 F (37 C)] 97.3 F (36.3 C) (03/16 1813) Pulse Rate:  [57-80] 62 (03/16 1813) Resp:  [11-20] 12 (03/16 1813) BP: (136-165)/(73-95) 139/81 mmHg (03/16 1813) SpO2:  [99 %-100 %] 100 % (03/16 1813) Weight:  [270 lb (122.471 kg)] 270 lb (122.471 kg) (03/16 1700) I have reviewed patient's vital signs, intake and output and medications.  General: cooperative, appears stated age, mild distress and moderately obese Resp: clear to auscultation bilaterally Cardio: regular rate and rhythm, S1, S2 normal, no murmur, click, rub or gallop GI: soft, non-tender; bowel sounds normal; no masses,  no organomegaly Extremities: extremities normal, atraumatic, no cyanosis or edema Vaginal Bleeding: none Urine output excellent Assessment: s/Moore Procedure(s): LAPAROSCOPIC ASSISTED VAGINAL HYSTERECTOMY WITH SALPINGECTOMY (Bilateral) LAPAROSCOPIC OVARIAN CYSTECTOMY (Left): stable  Plan: Advance diet Encourage ambulation Advance to PO medication Discharge home on 03/12/16     Victoria Moore 03/11/2016, 7:34 PM

## 2016-03-11 NOTE — Anesthesia Preprocedure Evaluation (Addendum)
Anesthesia Evaluation  Patient identified by MRN, date of birth, ID band Patient awake    Reviewed: Allergy & Precautions, NPO status , Patient's Chart, lab work & pertinent test results  History of Anesthesia Complications Negative for: history of anesthetic complications  Airway Mallampati: I  TM Distance: >3 FB Neck ROM: Full    Dental  (+) Teeth Intact, Dental Advisory Given   Pulmonary former smoker,    Pulmonary exam normal        Cardiovascular negative cardio ROS Normal cardiovascular exam     Neuro/Psych Seizures -, Well Controlled,  Anxiety    GI/Hepatic Neg liver ROS, GERD  ,  Endo/Other  negative endocrine ROS  Renal/GU negative Renal ROS     Musculoskeletal   Abdominal   Peds  Hematology   Anesthesia Other Findings   Reproductive/Obstetrics                            Anesthesia Physical Anesthesia Plan  ASA: II  Anesthesia Plan: General   Post-op Pain Management:    Induction: Intravenous  Airway Management Planned: Oral ETT  Additional Equipment:   Intra-op Plan:   Post-operative Plan: Extubation in OR  Informed Consent: I have reviewed the patients History and Physical, chart, labs and discussed the procedure including the risks, benefits and alternatives for the proposed anesthesia with the patient or authorized representative who has indicated his/her understanding and acceptance.   Dental advisory given  Plan Discussed with: Anesthesiologist and CRNA  Anesthesia Plan Comments:        Anesthesia Quick Evaluation

## 2016-03-11 NOTE — Transfer of Care (Signed)
Immediate Anesthesia Transfer of Care Note  Patient: Catharina Steeples  Procedure(s) Performed: Procedure(s): LAPAROSCOPIC ASSISTED VAGINAL HYSTERECTOMY WITH SALPINGECTOMY (Bilateral) LAPAROSCOPIC OVARIAN CYSTECTOMY (Left)  Patient Location: PACU  Anesthesia Type:General  Level of Consciousness: awake, alert  and oriented  Airway & Oxygen Therapy: Patient Spontanous Breathing and Patient connected to nasal cannula oxygen  Post-op Assessment: Report given to RN and Post -op Vital signs reviewed and stable  Post vital signs: Reviewed and stable  Last Vitals:  Filed Vitals:   03/11/16 0841  BP: 136/93  Pulse: 80  Temp: 37 C  Resp: 20    Complications: No apparent anesthesia complications

## 2016-03-11 NOTE — H&P (Signed)
History and Physical Interval Note:   03/11/2016   10:02 AM   Victoria Moore  has presented today for surgery, with the diagnosis of Uterine Fibroids, Dysmenorrhea, Anemia, Menorrhagia, LEFT OVARIAN CYST  The various methods of treatment have been discussed with the patient and family. After consideration of risks, benefits and other options for treatment, the patient has consented to  Procedure(s): LAPAROSCOPIC ASSISTED VAGINAL HYSTERECTOMY WITH SALPINGECTOMY POSSIBLE LAPAROSCOPIC RIGHT OOPHERECTOMY, LAPAROSCOPIC LEFT OOPHERECTOMY POSSIBLE LAPAROSCOPIC OVARIAN CYSTECTOMY possiible HYSTERECTOMY ABDOMINAL as a surgical intervention .  I have reviewed the patients' chart and labs.  Questions were answered to the patient's satisfaction.   The patient has requested in our outpatient office last week and again today that Thurmon Nunnely, who is accompanying her today be her Press photographer.  She consents to transfusion if needed.  She wishes to proceed.   Eldred Manges  MD

## 2016-03-11 NOTE — Anesthesia Postprocedure Evaluation (Signed)
Anesthesia Post Note  Patient: Victoria Moore  Procedure(s) Performed: Procedure(s) (LRB): LAPAROSCOPIC ASSISTED VAGINAL HYSTERECTOMY WITH SALPINGECTOMY (Bilateral) LAPAROSCOPIC OVARIAN CYSTECTOMY (Left)  Patient location during evaluation: PACU Anesthesia Type: General Level of consciousness: awake and alert Pain management: pain level controlled Vital Signs Assessment: post-procedure vital signs reviewed and stable Respiratory status: spontaneous breathing, nonlabored ventilation, respiratory function stable and patient connected to nasal cannula oxygen Cardiovascular status: blood pressure returned to baseline and stable Postop Assessment: no signs of nausea or vomiting Anesthetic complications: no    Last Vitals:  Filed Vitals:   03/11/16 1515 03/11/16 1530  BP: 163/95 160/92  Pulse:    Temp:    Resp:      Last Pain:  Filed Vitals:   03/11/16 1538  PainSc: Asleep                 Alexis Frock

## 2016-03-12 ENCOUNTER — Encounter (HOSPITAL_COMMUNITY): Payer: Self-pay | Admitting: Obstetrics and Gynecology

## 2016-03-12 DIAGNOSIS — D251 Intramural leiomyoma of uterus: Secondary | ICD-10-CM | POA: Diagnosis not present

## 2016-03-12 LAB — CBC
HCT: 30.4 % — ABNORMAL LOW (ref 36.0–46.0)
Hemoglobin: 9.1 g/dL — ABNORMAL LOW (ref 12.0–15.0)
MCH: 22.5 pg — ABNORMAL LOW (ref 26.0–34.0)
MCHC: 29.9 g/dL — ABNORMAL LOW (ref 30.0–36.0)
MCV: 75.2 fL — ABNORMAL LOW (ref 78.0–100.0)
Platelets: 206 10*3/uL (ref 150–400)
RBC: 4.04 MIL/uL (ref 3.87–5.11)
RDW: 22.7 % — ABNORMAL HIGH (ref 11.5–15.5)
WBC: 10.9 10*3/uL — ABNORMAL HIGH (ref 4.0–10.5)

## 2016-03-12 MED ORDER — ONDANSETRON HCL 4 MG PO TABS: 4.0000 mg | ORAL_TABLET | Freq: Four times a day (QID) | ORAL | Status: DC | PRN

## 2016-03-12 NOTE — Progress Notes (Signed)
Kentara Naro is a9 y.o.  [id number]  Post Op Date # 1: LAPAROSCOPIC ASSISTED VAGINAL HYSTERECTOMY WITH SALPINGECTOMY (Bilateral) LAPAROSCOPIC OVARIAN CYSTECTOMY (Left)  Subjective: Patient is Doing well postoperatively. She reports pain as she is transitioning from PCA to oral medications but previously had pain controlled with PCA Hydromorphone. Patient has been tolerating liquids with no nausea, has ambulated in early morning, with reports of feeling "woozy" but was able to complete walk in the halls.  Hasn't voided yet (Foley removed 20 minutes ago) and is preparing to order a regular breakfast.   Objective: Vital signs in last 24 hours: Temp:  [96.8 F (36 C)-98.6 F (37 C)] 98.2 F (36.8 C) (03/17 0555) Pulse Rate:  [57-86] 71 (03/17 0555) Resp:  [11-20] 13 (03/17 0555) BP: (95-165)/(58-95) 121/71 mmHg (03/17 0555) SpO2:  [99 %-100 %] 100 % (03/17 0555) Weight:  [270 lb (122.471 kg)] 270 lb (122.471 kg) (03/16 1700)  Intake/Output from previous day: 03/16 0701 - 03/17 0700 In: 5264.6 [P.O.:600; I.V.:4664.6] Out: 3450 [Urine:3250] Intake/Output this shift:    Recent Labs Lab 03/11/16 0857 03/12/16 0500  WBC 5.6 10.9*  HGB 10.4* 9.1*  HCT 34.1* 30.4*  PLT 204 206     Recent Labs Lab 03/11/16 0857  NA 137  K 4.0  CL 106  CO2 27  BUN 13  CREATININE 0.87  CALCIUM 8.6*  PROT 7.5  BILITOT 0.3  ALKPHOS 48  ALT 16  AST 21  GLUCOSE 85    EXAM: General: cooperative, fatigued and no distress Resp: clear to auscultation bilaterally Cardio: regular rate and rhythm, S1, S2 normal, no murmur, click, rub or gallop GI: Bowel sounds present though hypoactive; abdominal dressings are  clean/dry/intact Extremities: Homans sign is negative, no sign of DVT and SCD hose in place-functioning, no calf tenderness.   Assessment: s/p Procedure(s): LAPAROSCOPIC  SALPINGECTOMY BILATERAL TOTAL VAGINAL HYSTERECTOMY WITH LAPAROSCOPIC OVARIAN CYSTECTOMY: stable,  progressing well and anemia But hemodynamically stable.  Plan: Advance diet Encourage ambulation Advance to PO medication Routine care   Discharge home  [place], PA-C 03/12/2016 7:20 AM

## 2016-03-12 NOTE — Discharge Summary (Signed)
Physician Discharge Summary  Patient ID: Victoria Moore MRN: [id number] DOB/AGE: 01/13/1976 40 y.o.  Admit date: 03/11/2016 Discharge date: 03/12/2016   Discharge Diagnoses:  Uterine Fibroids,  Menorrhagia, Anemia,  Left Ovarian Cyst Active Problems:   Menorrhagia   Fibroids   Operation: Laparoscopic Left Ovarian Cystectomy, Bilateral Salpingectomy and Vaginal Hysterectomy   Discharged Condition: Good  Hospital Course: On the date of admission the patient underwent the aforementioned procedures and tolerated them well.  Post operative course was unremarkable with the patient resuming bowel and bladder function by post operative day #1 and was therefore deemed ready for discharge home.  Discharge hemoglobin was 9.1.   Disposition: 01-Home or Self Care  Discharge Medications:    Medication List    STOP taking these medications        acetaminophen-codeine 300-30 MG tablet  Commonly known as:  TYLENOL #3     HYDROcodone-acetaminophen 5-325 MG tablet  Commonly known as:  NORCO/VICODIN     medroxyPROGESTERone 10 MG tablet  Commonly known as:  PROVERA     ondansetron 4 MG disintegrating tablet  Commonly known as:  ZOFRAN ODT     oxyCODONE-acetaminophen 5-325 MG tablet  Commonly known as:  PERCOCET/ROXICET      TAKE these medications        ALPRAZolam 1 MG tablet  Commonly known as:  XANAX  Take 1 mg by mouth daily as needed for anxiety.     calcium carbonate 500 MG chewable tablet  Commonly known as:  TUMS  Chew 1 tablet (200 mg of elemental calcium total) by mouth daily.     cyclobenzaprine 10 MG tablet  Commonly known as:  FLEXERIL  Take 10 mg by mouth 3 (three) times daily as needed for muscle spasms.     diphenhydrAMINE 25 MG tablet  Commonly known as:  BENADRYL  Take 2 tablets (50 mg total) by mouth every 6 (six) hours. Take 1-2 tablets every 6 hours x 2 days, then space out to an as needed basis     DULERA 100-5 MCG/ACT Aero  Generic drug:   mometasone-formoterol  Inhale 2 puffs into the lungs 2 (two) times daily.     ferrous sulfate 325 (65 FE) MG tablet  Take 1 tablet (325 mg total) by mouth 3 (three) times daily with meals.     methocarbamol 500 MG tablet  Commonly known as:  ROBAXIN  Take 500 mg by mouth every 8 (eight) hours as needed for muscle spasms.     ondansetron 4 MG tablet  Commonly known as:  ZOFRAN  Take 1 tablet (4 mg total) by mouth every 6 (six) hours as needed for nausea.     pantoprazole 40 MG tablet  Commonly known as:  PROTONIX  Take 40 mg by mouth daily as needed (Acid reflux). Reported on 03/03/2016     phenytoin 100 MG ER capsule  Commonly known as:  DILANTIN  Take 3 capsules (300 mg total) by mouth at bedtime.     Vitamin D (Ergocalciferol) 50000 units Caps capsule  Commonly known as:  DRISDOL  Take 50,000 Units by mouth every 7 (seven) days. Saturday          Follow-up: Dr. Lorriane Shire P. Garren Greenman on March 24, 2016 at 3:45 p.m.     SignedEarnstine Regal, PA-C 03/12/2016, 8:23 AM

## 2016-03-12 NOTE — Discharge Instructions (Signed)
Call [facility] @ [phone] if:  You have a temperature greater than or equal to 100.4 degrees Farenheit orally You have pain that is not made better by the pain medication given and taken as directed You have excessive bleeding or problems urinating  Take Colace (Docusate Sodium/Stool Softener) 100 mg 2-3 times daily while taking narcotic pain medicine to avoid constipation or until bowel movements are regular. Take iron supplementation of your choice,  twice a day for the next 12 weeks Take Ibuprofen 600 mg with food every 6 hours for 5 days then as needed for pain  You may drive after 2 weeks You may walk up steps  You may shower t You may resume a regular diet  Keep incisions clean and dry;   remove the honeycomb dressing on 03/18/2016 Do not lift over 15 pounds for 6 weeks Avoid anything in vagina for 6 weeks (or until after your post-operative visit)

## 2016-03-12 NOTE — Anesthesia Postprocedure Evaluation (Signed)
Anesthesia Post Note  Patient: Victoria Moore  Procedure(s) Performed: Procedure(s) (LRB): LAPAROSCOPIC ASSISTED VAGINAL HYSTERECTOMY WITH SALPINGECTOMY (Bilateral) LAPAROSCOPIC OVARIAN CYSTECTOMY (Left)  Patient location during evaluation: [facility] Anesthesia Type: General Level of consciousness: awake and alert and oriented Pain management: pain level controlled Vital Signs Assessment: post-procedure vital signs reviewed and stable Respiratory status: spontaneous breathing Cardiovascular status: blood pressure returned to baseline Postop Assessment: no signs of nausea or vomiting and adequate PO intake Anesthetic complications: no    Last Vitals:  Filed Vitals:   03/12/16 0005 03/12/16 0555  BP: 132/81 121/71  Pulse: 86 71  Temp:  36.8 C  Resp:  13    Last Pain:  Filed Vitals:   03/12/16 0640  PainSc: 3                  Desyre Calma

## 2016-03-12 NOTE — Progress Notes (Signed)
Discharge instructions complete. Pt understood all instructions and did not have any questions. Pt given pain medicine before discharge. Pt ambulated out of the hospital and discharged home to family.

## 2016-03-12 NOTE — Addendum Note (Signed)
Addendum  created 03/12/16 0806 by Talbot Grumbling, CRNA   Modules edited: Clinical Notes   Clinical Notes:  File: [id number]

## 2016-03-13 NOTE — Op Note (Addendum)
OPERATIVE REPORT   INDICATIONS:abnormal uterine bleeding, adnexal mass and fibroids, anemia, dysmenorrhea  PRE-OP DIAGNOSIS:Uterine Fibroids, Dysmenorrhea, Anemia, Menorrhagia, LEFT OVARIAN CYST   POST-OP DIAGNOSIS;Uterine Fibroids, Dysmenorrhea, Anemia, Menorrhagia, LEFT OVARIAN CYST   PROCEDURE:Procedure(s) (LRB): LAPAROSCOPIC  SALPINGECTOMY (Bilateral) LAPAROSCOPIC OVARIAN CYSTECTOMY (Left)  TOTAL VAGINAL HYSTERECTOMY  SURGEON:Arielis Leonhart P  ASSIST:Elmira Florene Glen certified physician assistant  Findings: Uterus was enlarged to approximately 6-8 weeks size with small subserosal myomata. Tubes bilaterally were status post interruption for tubal sterilization. Right ovary was normal without evidence of adhesions or stigmata of endometriosis. The left ovary contained a simple cyst with no excrescences. The cyst measured less than 2 cm.  SPECIMENS: Uterus, cervix, bilateral portions of fallopian tubes, left ovarian cyst  DISPOSITION OF SPECIMEN:pathology  [id number]  COMPLICATIONS: none   PATIENT TO:  PACU - hemodynamically stable.  PROCEDURE DETAILS: The patient was taken to the operating room after appropriate identification and placed on the operating table. A time out was held.  After the attainment of adequate general anesthesia the patient was placed in the modified lithotomy position. The abdomen was prepped with ChloraPrep and perineum and vagina were prepped with multiple layers of Betadine. A Foley catheter was inserted into the bladder and connected to straight drainage.The abdomen and perineum were draped as a sterile field.  A single-tooth tenaculum was placed on the anterior cervix. Subumbilical and suprapubic injection of 1% Marcaine was undertaken for total of 10 cc. A subumbilical incision was made and carried down to the fascia. The fascia was incised and the peritoneum entered bluntly. The Hassan cannula was placed into the peritoneal cavity under direct  visualization. After the fascial edges had been marked with sutures of 0 Vicryl and held on either side. The Hassan cannula was tied down and the laparoscope placed through that [facility] sleeve. A pneumoperitoneum was created with 4 L of CO2. Suprapubic incisions were made to the right and left of midline, and laparoscopic probe trochars of 5 mm placed through those incisions into the peritoneal cavity under direct visualization. The above-noted findings were made. The simple appearing left ovarian cyst was addressed by incising the ovarian capsule with the Harmonic Ace. The incision was then taken around the cyst and subsequently the cyst wall grasped. With a combination of blunt and sharp dissection the cyst wall was removed from the ovarian stroma. That cyst wall was then removed from the peritoneal cavity through the umbilical port. He was stasis was achieved with the Harmonic Ace. Irrigation was carried out. The left fallopian tube was then elevated and the mesial salpinx, cut, cauterized. Then cut all long length of the distal tube to the level of its separation status post tubal sterilization, so that that portion of tube that was not connected to the uterus was excised and removed from the operative field through the umbilical port. Hemostasis was noted to be adequate. A similar procedure was carried out with the fallopian tube on the right side. Irrigation revealed adequate hemostasis. The laparoscope and trochars were removed, leaving the trocar sleeves suprapubically and subumbilically. The surgeon moved to the perineum as the site of operation.  A weighted speculum was placed in the posterior vagina and Lahey tenaculae were placed on the anterior and posterior surfaces of the cervix. The cervicovaginal mucosa was injected with a dilute solution of Pitressin. The cervix was circumscribed. The anterior vaginal mucosa wasbluntly dissected off the anterior cervix and the anterior peritoneum entered. The  bladder blade was placed and the bladder elevated. The posterior peritoneum  was entered sharply and tagged. Uterosacral ligaments on the right and left side were clamped cut and suture ligated, and the sutures held. The paracervical tissues were then clamped cut and suture ligated. The uterine arteries were clamped cut and suture ligated. The parametral tissues were clamped, cut, and suture ligated.The uterus was inverted and the remaining parametrial connections were clamped cut tied with free tie and suture-ligated. The uterus and cervix were removed from the operative field. The McCall culdoplasty sutures were then placed incorporating the uterosacral ligaments on either side, and the intervening peritoneum.  These were held. The vaginal angles were created with the sutures from the  uterosacral ligaments that had been held and placed through the anterior and posterior surfaces of the vagina then tied down. The remainder of the vaginal cuff was closed with figure-of-eight sutures of incorporating anterior vaginal mucosa, anterior peritoneum, posterior peritoneum and posterior vaginal mucosa in each stitch.  All sutures used were 0 Vicryl.  The McCall culdoplasty sutures were then tied down. Hemostasis was noted to be adequate. The surgeon changed gown and gloves and reestablished a pneumoperitoneum. Laparoscope was placed through the [facility] sleeve and blunt probes placed through the suprapubic sleeves. The vaginal cuff was inspected, infection, felt to be hemostatic. The area of excision of the ovarian cyst was likewise inspected and felt to be hemostatic. All instruments were then removed from the peritoneal cavity. As the CO2 was allowed to escape. Subumbilical fascial incision was closed with the holding 0 Vicryl sutures in a figure-of-eight fashion. The skin incision of the subumbilical port was closed with 3-0 Monocryl. The suprapubic incisions were closed with Dermabond. Terrell dressings were applied.   The patient was awakened from general anesthesia and taken to the recovery room in satisfactory condition having tolerated the procedure well the sponge and instrument counts correct.  Eldred Manges, MD 11:23 AM

## 2016-04-15 ENCOUNTER — Encounter: Payer: Self-pay | Admitting: Internal Medicine

## 2016-04-15 ENCOUNTER — Ambulatory Visit (INDEPENDENT_AMBULATORY_CARE_PROVIDER_SITE_OTHER): Payer: BLUE CROSS/BLUE SHIELD | Admitting: Internal Medicine

## 2016-04-15 VITALS — BP 130/78 | HR 74 | Ht 72.0 in | Wt 276.0 lb

## 2016-04-15 DIAGNOSIS — R0600 Dyspnea, unspecified: Secondary | ICD-10-CM

## 2016-04-15 DIAGNOSIS — R599 Enlarged lymph nodes, unspecified: Secondary | ICD-10-CM

## 2016-04-15 DIAGNOSIS — R0609 Other forms of dyspnea: Secondary | ICD-10-CM | POA: Insufficient documentation

## 2016-04-15 DIAGNOSIS — R590 Localized enlarged lymph nodes: Secondary | ICD-10-CM | POA: Insufficient documentation

## 2016-04-15 HISTORY — DX: Dyspnea, unspecified: R06.00

## 2016-04-15 NOTE — Progress Notes (Signed)
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 Pulmonary Consult    Referred by Dr. Lujean Amel. 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 She c/o SOB since then, esp worse in the past 2 months. SOB comes and goes, and is worse with exertion. She sometimes wakes up in the night with SOB. She has occ cough- sometimes prod with min yellow sputum.    1 y h/o persistent daily doe x every time across the room - occ happens at rest   Cough brought on by smells/ assoc with mild dysphagia on nexium 40 mg daily / sometimes coughs so hard hurts in center of chest transient only  Can't lie flat x one year , ok propped up  No better on dulera 100 though inhaler she brought was on 0 and using just samples so far on 2 bid basis with very poor hfa - see a/p Breathing improves p mom's neb saba   No obvious other patterns in day to day or daytime variabilty or assoc chronic cough or classically pleuritic/lateralizing or ex  cp or chest tightness, subjective wheeze overt sinus   symptoms. No unusual exp hx or h/o childhood pna/ asthma or knowledge of premature birth.  Also denies any obvious fluctuation of symptoms with weather or other  environmental changes or other aggravating or alleviating factors except as outlined above   Current Medications, Allergies, Complete Past Medical History, Past Surgical History, Family History, and Social History were reviewed in [employer] record.               Review of Systems  Constitutional: Negative for  fever, chills and unexpected weight change.  HENT: Positive for sore throat and trouble swallowing. Negative for congestion, dental problem, ear pain, nosebleeds, postnasal drip, rhinorrhea, sinus pressure, sneezing and voice change.   Eyes: Negative for visual disturbance.  Respiratory: Positive for cough and shortness of breath. Negative for choking.   Cardiovascular: Positive for chest pain. Negative for leg swelling.  Gastrointestinal: Negative for vomiting, abdominal pain and diarrhea.  Genitourinary: Negative for difficulty urinating.  Musculoskeletal: Negative for arthralgias.  Skin: Negative for rash.  Neurological: Positive for headaches. Negative for tremors and syncope.  Hematological: Does not bruise/bleed easily.       Objective:   Physical Exam  amb obese bf occ throat clearing   Wt Readings from Last 3 Encounters:  04/15/16 276 lb (125.193 kg)  03/11/16 270 lb (122.471 kg)  03/02/16 270 lb (122.471 kg)    Vital signs reviewed    HEENT: nl dentition, turbinates, and oropharynx. Nl external ear canals without cough reflex   NECK :  without JVD/Nodes/TM/ nl carotid upstrokes bilaterally   LUNGS: no acc muscle use,  Nl contour chest which is clear to A and P bilaterally without cough on insp or exp maneuvers   CV:  RRR  no s3 or murmur or increase in P2, no edema   ABD:  soft and nontender with  nl inspiratory excursion in the supine position. No bruits or organomegaly, bowel sounds nl  MS:  Nl gait/ ext warm without deformities, calf tenderness, cyanosis or clubbing No obvious joint restrictions   SKIN: warm and dry without lesions    NEURO:  alert, approp, nl sensorium with  no motor deficits    I personally reviewed images and agree with radiology impression as follows:  CXR:  02/03/16  Negative chest. No acute cardiopulmonary abnormality.              Assessment & Plan:

## 2016-04-15 NOTE — Patient Instructions (Addendum)
Continue dulera 100 Take 2 puffs first thing in am and then another 2 puffs about 12 hours later.   Work on inhaler technique:  relax and gently blow all the way out then take a nice smooth deep breath back in, triggering the inhaler at same time you start breathing in.  Hold for up to 5 seconds if you can. Blow out thru nose. Rinse and gargle with water when done     Continue nexium 40 mg   Take  30-60 min before first meal of the day and Pepcid (famotidine)  20 mg one in evening or  toward bedtime until return to office - this is the best way to tell whether stomach acid is contributing to your problem.     GERD (REFLUX)  is an extremely common cause of respiratory symptoms just like yours , many times with no obvious heartburn at all.    It can be treated with medication, but also with lifestyle changes including elevation of the head of your bed (ideally with 6 inch  bed blocks),  Smoking cessation, avoidance of late meals, excessive alcohol, and avoid fatty foods, chocolate, peppermint, colas, red wine, and acidic juices such as orange juice.  NO MINT OR MENTHOL PRODUCTS SO NO COUGH DROPS  USE SUGARLESS CANDY INSTEAD (Jolley ranchers or Stover's or Life Savers) or even ice chips will also do - the key is to swallow to prevent all throat clearing. NO OIL BASED VITAMINS - use powdered substitutes.  Please remember to go to the lab  downstairs for your tests - we will call you with the results when they are available.  Please schedule a follow up office visit in 4 weeks, sooner if needed with all active  medications in hand including over the counters  Add pfts on return at [facility]

## 2016-04-15 NOTE — Assessment & Plan Note (Signed)
See CT 11/12/14 R hilar/ subcarinal : These are not significant by size criteria (size not reported)  - f/u cxr 02/03/2016 no vz adenopathy   A good rule of thumb is that >95% of pts with active sarcoid in any organ will have some plain cxr changes - on the other hand  if there are active pulmonary symptoms the cxr will look much worse than the patient:  No evidence of either scenario here/ strongly doubt active dz

## 2016-04-15 NOTE — Assessment & Plan Note (Addendum)
04/15/2016  Walked RA x 3 laps @ 185 ft each stopped due to  End of study, nl pace, no sob or desat    - Spirometry 04/15/2016  Very truncated exp loop effort dep portion only   Symptoms are markedly disproportionate to objective findings and not clear this is a lung problem but pt does appear to have difficult airway management issues. DDX of  difficult airways management almost all start with A and  include Adherence, Ace Inhibitors, Acid Reflux, Active Sinus Disease, Alpha 1 Antitripsin deficiency, Anxiety masquerading as Airways dz,  ABPA,  Allergy(esp in young), Aspiration (esp in elderly), Adverse effects of meds,  Active smokers, A bunch of PE's (a small clot burden can't cause this syndrome unless there is already severe underlying pulm or vascular dz with poor reserve) plus two Bs  = Bronchiectasis and Beta blocker use..and one C= CHF  In this case Adherence is the biggest issue and starts with  inability to use HFA effectively and also  understand that SABA treats the symptoms but doesn't get to the underlying problem (inflammation).  I used  the analogy of putting steroid cream on a rash to help explain the meaning of topical therapy and the need to get the drug to the target tissue.   - dulera on 0 - The proper method of use, as well as anticipated side effects, of a metered-dose inhaler are discussed and demonstrated to the patient. Improved effectiveness after extensive coaching during this visit to a level of approximately 75 % from a baseline of 25 % > try dulera 100 2bid x 4 weeks then return for pfts  ? Acid (or non-acid) GERD > always difficult to exclude as up to 75% of pts in some series report no assoc GI/ Heartburn symptoms> rec max (24h)  acid suppression and diet restrictions/ reviewed and instructions given in writing.   ? Anxiety > usually at the bottom of this list of usual suspects but should be much higher on this pt's based on H and P and note already on psychotropics .   ?  Allergy/ asthma > dulera 100 2bid should cover > check allergy profile     Total time devoted to counseling  = 35/14m review case with pt/ discussion of options/alternatives/ personally creating in presence of pt  then going over specific  Instructions directly with the pt including how to use all of the meds but in particular covering each new medication in detail (see avs)

## 2016-04-18 ENCOUNTER — Encounter: Payer: Self-pay | Admitting: Internal Medicine

## 2016-05-12 ENCOUNTER — Ambulatory Visit: Payer: BLUE CROSS/BLUE SHIELD | Admitting: Internal Medicine

## 2016-05-12 ENCOUNTER — Ambulatory Visit (HOSPITAL_COMMUNITY): Admission: RE | Admit: 2016-05-12 | Payer: BLUE CROSS/BLUE SHIELD | Source: Ambulatory Visit

## 2016-08-09 ENCOUNTER — Ambulatory Visit: Payer: BLUE CROSS/BLUE SHIELD | Admitting: Neurology

## 2016-10-02 ENCOUNTER — Encounter (HOSPITAL_COMMUNITY): Payer: Self-pay

## 2016-10-02 ENCOUNTER — Emergency Department (HOSPITAL_COMMUNITY): Payer: BLUE CROSS/BLUE SHIELD

## 2016-10-02 ENCOUNTER — Emergency Department (HOSPITAL_COMMUNITY)
Admission: EM | Admit: 2016-10-02 | Discharge: 2016-10-02 | Disposition: A | Discharge status: Home / Self Care | Payer: BLUE CROSS/BLUE SHIELD | Attending: Emergency Medicine | Admitting: Emergency Medicine

## 2016-10-02 DIAGNOSIS — Y92019 Unspecified place in single-family (private) house as the place of occurrence of the external cause: Secondary | ICD-10-CM | POA: Insufficient documentation

## 2016-10-02 DIAGNOSIS — W109XXA Fall (on) (from) unspecified stairs and steps, initial encounter: Secondary | ICD-10-CM | POA: Insufficient documentation

## 2016-10-02 DIAGNOSIS — Y999 Unspecified external cause status: Secondary | ICD-10-CM | POA: Insufficient documentation

## 2016-10-02 DIAGNOSIS — M5440 Lumbago with sciatica, unspecified side: Secondary | ICD-10-CM

## 2016-10-02 DIAGNOSIS — S300XXA Contusion of lower back and pelvis, initial encounter: Secondary | ICD-10-CM | POA: Insufficient documentation

## 2016-10-02 DIAGNOSIS — Y939 Activity, unspecified: Secondary | ICD-10-CM | POA: Diagnosis not present

## 2016-10-02 DIAGNOSIS — Z87891 Personal history of nicotine dependence: Secondary | ICD-10-CM | POA: Insufficient documentation

## 2016-10-02 DIAGNOSIS — S3992XA Unspecified injury of lower back, initial encounter: Secondary | ICD-10-CM | POA: Diagnosis present

## 2016-10-02 MED ORDER — METHOCARBAMOL 500 MG PO TABS: 500.0000 mg | ORAL_TABLET | Freq: Four times a day (QID) | ORAL | 0 refills | Status: DC

## 2016-10-02 NOTE — Discharge Instructions (Signed)
Please read and follow all provided instructions.  Your diagnoses today include:  1. Contusion of coccyx, initial encounter   2. Acute midline low back pain with sciatica, sciatica laterality unspecified    Tests performed today include:  Vital signs - see below for your results today  X-ray of lower back - no fractures or serious problems  Medications prescribed:   Robaxin (methocarbamol) - muscle relaxer medication  DO NOT drive or perform any activities that require you to be awake and alert because this medicine can make you drowsy.    Naproxen - anti-inflammatory pain medication  Do not exceed 500mg  naproxen every 12 hours, take with food  You have been prescribed an anti-inflammatory medication or NSAID. Take with food. Take smallest effective dose for the shortest duration needed for your pain. Stop taking if you experience stomach pain or vomiting.   Take any prescribed medications only as directed.  Home care instructions:   Follow any educational materials contained in this packet  Please rest, use ice or heat on your back for the next several days  Do not lift, push, pull anything more than 10 pounds for the next week  Follow-up instructions: Please follow-up with your primary care provider in the next 1 week for further evaluation of your symptoms.   Return instructions:  SEEK IMMEDIATE MEDICAL ATTENTION IF YOU HAVE:  New numbness, tingling, weakness, or problem with the use of your arms or legs  Severe back pain not relieved with medications  Loss control of your bowels or bladder  Increasing pain in any areas of the body (such as chest or abdominal pain)  Shortness of breath, dizziness, or fainting.   Worsening nausea (feeling sick to your stomach), vomiting, fever, or sweats  Any other emergent concerns regarding your health   Additional Information:  Your vital signs today were: BP 134/86 (BP Location: Left Arm)    Pulse 65    Temp 98.1 F  (36.7 C) (Oral)    Resp 17    Ht 5\' 9"  (1.753 m)    Wt 130.2 kg    LMP 02/19/2016    SpO2 100%    BMI 42.38 kg/m  If your blood pressure (BP) was elevated above 135/85 this visit, please have this repeated by your doctor within one month. --------------

## 2016-10-02 NOTE — ED Triage Notes (Signed)
PT C/O LOWER BACK AND COCCYX PAIN AFTER A SLIP AND FALL ON Wednesday. PT DENIES HEAD INJURY, LOC, OR NEUROLOGIC INVOLVEMENT.

## 2016-10-02 NOTE — ED Provider Notes (Signed)
[facility] DEPT Provider Note   CSN: [id number] Arrival date & time: 10/02/16  2005  By signing my name below, I, Victoria Moore, attest that this documentation has been prepared under the direction and in the presence of [employer].  Electronically Signed: Ephriam Moore, ED Scribe. 10/02/16. 9:01 PM.  History   Chief Complaint Chief Complaint  Patient presents with  . Tailbone Pain    HPI HPI Comments: Victoria Moore is a 40 y.o. female who presents to the Emergency Department complaining of lower back and coccyx pain onset 3 days ago. Pt slipped and fell on stairs and landed on her tailbone. Pt has taken Hydrocodone prescribed by her PCP for a different problem with no relief of pain. She is able to ambulate but with increased difficulty due to pain. No numbness or other injuries. Patient denies warning symptoms of back pain including: fecal incontinence, urinary retention or overflow incontinence, night sweats, waking from sleep with back pain, unexplained fevers or weight loss, h/o cancer.     The history is provided by the patient. No language interpreter was used.    Past Medical History:  Diagnosis Date  . Anemia   . Anxiety   . GERD (gastroesophageal reflux disease)   . Sarcoidosis ([place])   . Seizures ([place])    last one year ago    Patient Active Problem List   Diagnosis Date Noted  . Dyspnea 04/15/2016  . Hilar adenopathy 04/15/2016  . Convulsions/seizures ([place]) 03/11/2016  . Fibroids 03/11/2016  . Anemia 11/12/2014  . Symptomatic anemia 11/12/2014  . Menorrhagia 11/12/2014  . Iron deficiency 11/12/2014  . Right-sided chest pain 11/12/2014    Past Surgical History:  Procedure Laterality Date  . ABDOMINAL HYSTERECTOMY    . LAPAROSCOPIC OVARIAN CYSTECTOMY Left 03/11/2016   Procedure: LAPAROSCOPIC OVARIAN CYSTECTOMY;  Surgeon: Eldred Manges, MD;  Location: [facility];  Service: Gynecology;  Laterality: Left;  . LAPAROSCOPIC VAGINAL HYSTERECTOMY WITH  SALPINGECTOMY Bilateral 03/11/2016   Procedure: LAPAROSCOPIC ASSISTED VAGINAL HYSTERECTOMY WITH SALPINGECTOMY;  Surgeon: Eldred Manges, MD;  Location: [facility];  Service: Gynecology;  Laterality: Bilateral;  . TUBAL LIGATION      OB History    Gravida Para Term Preterm AB Living   1             SAB TAB Ectopic Multiple Live Births                   Home Medications    Prior to Admission medications   Medication Sig Start Date End Date Taking? Authorizing Provider  ALPRAZolam Duanne Moron) 1 MG tablet Take 1 mg by mouth daily as needed for anxiety.    Historical Provider, MD  cyclobenzaprine (FLEXERIL) 10 MG tablet Take 10 mg by mouth 3 (three) times daily as needed for muscle spasms.    Historical Provider, MD  esomeprazole (NEXIUM) 40 MG capsule Take 40 mg by mouth daily at 12 noon.    Historical Provider, MD  ferrous sulfate 325 (65 FE) MG tablet Take 1 tablet (325 mg total) by mouth 3 (three) times daily with meals. 11/22/15   Blanchie Dessert, MD  mometasone-formoterol (DULERA) 100-5 MCG/ACT AERO Inhale 2 puffs into the lungs 2 (two) times daily.    Historical Provider, MD  ondansetron (ZOFRAN) 4 MG tablet Take 1 tablet (4 mg total) by mouth every 6 (six) hours as needed for nausea. 03/12/16   Earnstine Regal, PA-C  phenytoin (DILANTIN) 100 MG ER capsule Take 3 capsules (300  mg total) by mouth at bedtime. Patient taking differently: Take 300 mg by mouth at bedtime. Pt states she was told to take Dilantin "as needed" and has not taken it in many months.  She states she has not had a seizure since prior to her last neurology visit. 05/12/15   Merryl Hacker, MD  Vitamin D, Ergocalciferol, (DRISDOL) 50000 units CAPS capsule Take 50,000 Units by mouth every 7 (seven) days. Saturday    Historical Provider, MD    Family History Family History  Problem Relation Age of Onset  . Adopted: Yes  . Other Son     Growing pains  . Asthma Mother     Social History Social History  Substance Use  Topics  . Smoking status: Former Smoker    Packs/day: 0.25    Years: 17.00    Types: Cigarettes    Quit date: 03/02/2013  . Smokeless tobacco: Never Used  . Alcohol use No     Allergies   Aspirin   Review of Systems Review of Systems  Constitutional: Negative for fever and unexpected weight change.  Gastrointestinal: Negative for constipation.       Negative for fecal incontinence.   Genitourinary: Negative for dysuria, flank pain, hematuria, pelvic pain, vaginal bleeding and vaginal discharge.       Negative for urinary incontinence or retention.  Musculoskeletal: Positive for back pain (lower, tailbone) and gait problem (due to pain).  Neurological: Negative for weakness and numbness.       Denies saddle paresthesias.     Physical Exam Updated Vital Signs BP 134/86 (BP Location: Left Arm)   Pulse 65   Temp 98.1 F (36.7 C) (Oral)   Resp 17   Ht 5\' 9"  (1.753 m)   Wt 287 lb (130.2 kg)   LMP 02/19/2016   SpO2 100%   BMI 42.38 kg/m   Physical Exam  Constitutional: She appears well-developed and well-nourished.  HENT:  Head: Normocephalic and atraumatic.  Eyes: Conjunctivae are normal.  Neck: Normal range of motion. Neck supple.  Pulmonary/Chest: Effort normal.  Abdominal: Soft. There is no tenderness. There is no CVA tenderness.  Musculoskeletal: Normal range of motion.       Cervical back: She exhibits normal range of motion, no tenderness and no bony tenderness.       Thoracic back: She exhibits normal range of motion, no tenderness and no bony tenderness.       Lumbar back: She exhibits tenderness (lower L-spine) and bony tenderness (lower L-spine). She exhibits normal range of motion.  No step-off noted with palpation of spine.   Neurological: She is alert. She has normal strength and normal reflexes. No sensory deficit.  5/5 strength in entire lower extremities bilaterally. No sensation deficit.   Skin: Skin is warm and dry. No rash noted.  Psychiatric: She  has a normal mood and affect.  Nursing note and vitals reviewed.    ED Treatments / Results  DIAGNOSTIC STUDIES: Oxygen Saturation is 100% on RA, normal by my interpretation.  COORDINATION OF CARE: 9:00 PM-Will order x-ray of L-spine. Discussed treatment plan with pt at bedside and pt agreed to plan. Discussed that coccyx imaging will not change what we will do.   Radiology Dg Lumbar Spine Complete  Result Date: 10/02/2016 CLINICAL DATA:  Patient had a fall at home on Wednesday. Pain across the low back. EXAM: LUMBAR SPINE - COMPLETE 4+ VIEW COMPARISON:  02/19/2016 FINDINGS: Normal alignment of the lumbar spine. No vertebral compression deformities.  Intervertebral disc space heights are preserved. Degenerative changes in the lower thoracic spine without change since prior study. No focal bone lesion or bone destruction. IMPRESSION: No acute bony abnormalities.  Normal alignment. Electronically Signed   By: Lucienne Capers M.D.   On: 10/02/2016 21:36    Procedures Procedures (including critical care time)   Initial Impression / Assessment and Plan / ED Course  I have reviewed the triage vital signs and the nursing notes.  Pertinent labs & imaging results that were available during my care of the patient were reviewed by me and considered in my medical decision making (see chart for details).  Clinical Course   Vital signs reviewed and are as follows: Vitals:   10/02/16 2038 10/02/16 2149  BP: 134/86 142/87  Pulse: 65 66  Resp: 17 18  Temp: 98.1 F (36.7 C)    9:59 PM patient informed of x-ray results. Will change muscle relaxer from Flexeril to Robaxin. She will continue NSAIDs. Discussed ice and heat. Encouraged her to buy a donut pillow if she has significant coccygeal pain. Follow-up with doctor as needed in one week.  Final Clinical Impressions(s) / ED Diagnoses   Final diagnoses:  Contusion of coccyx, initial encounter  Acute midline low back pain with sciatica,  sciatica laterality unspecified    New Prescriptions New Prescriptions   METHOCARBAMOL (ROBAXIN) 500 MG TABLET    Take 1 tablet (500 mg total) by mouth 4 (four) times daily.  I personally performed the services described in this documentation, which was scribed in my presence. The recorded information has been reviewed and is accurate.     Carlisle Cater, PA-C 10/02/16 [place], MD 10/02/16 2249

## 2016-10-07 ENCOUNTER — Ambulatory Visit (INDEPENDENT_AMBULATORY_CARE_PROVIDER_SITE_OTHER): Payer: BLUE CROSS/BLUE SHIELD | Admitting: Neurology

## 2016-10-07 ENCOUNTER — Encounter: Payer: Self-pay | Admitting: Neurology

## 2016-10-07 VITALS — BP 124/85 | HR 74 | Ht 69.0 in | Wt 282.0 lb

## 2016-10-07 DIAGNOSIS — R569 Unspecified convulsions: Secondary | ICD-10-CM | POA: Diagnosis not present

## 2016-10-07 DIAGNOSIS — G43019 Migraine without aura, intractable, without status migrainosus: Secondary | ICD-10-CM | POA: Insufficient documentation

## 2016-10-07 HISTORY — DX: Migraine without aura, intractable, without status migrainosus: G43.019

## 2016-10-07 MED ORDER — TOPIRAMATE 25 MG PO TABS: ORAL_TABLET | ORAL | 3 refills | Status: DC

## 2016-10-07 NOTE — Progress Notes (Signed)
Reason for visit: Migraine headache  Referring physician: Dr. Florinda Marker is a 40 y.o. female  History of present illness:  Ms. Victoria Moore is a 40 year old right-handed black female with a history of seizures and headaches since around 40 years of age. The patient has done fairly well with her seizures recently. Her seizures are usually associated with an episode of altered consciousness, the patient does not lose complete awareness of what is going on around her, but she is unable to respond. She does not lose control the bowels or the bladder or have any tongue biting. She may fall down if she is standing up. The episodes may last about 10 minutes and clear. The patient last had a seizure event in April 2016. The patient is not on seizure medication on a regular basis, she apparently takes Dilantin "as needed". The patient reports ongoing daily bifrontal and temporal headaches that are throbbing in nature. She cannot remember the last day without headache. The headaches may be associated with photophobia and phonophobia, and occasional nausea. She denies any focal numbness or weakness of the face, arms, or legs. She may have some occasional dizziness and some occasional spots in front of the eyes. She does have some slight neck stiffness. She has never been on any medication to prevent headache. Her mother also has migraine headache. She is sent to this office for an evaluation.   Past Medical History:  Diagnosis Date  . Anemia   . Anxiety   . Common migraine with intractable migraine 10/07/2016  . GERD (gastroesophageal reflux disease)   . Migraine   . Sarcoidosis ([place])   . Seizures ([place])    last one year ago    Past Surgical History:  Procedure Laterality Date  . ABDOMINAL HYSTERECTOMY    . LAPAROSCOPIC OVARIAN CYSTECTOMY Left 03/11/2016   Procedure: LAPAROSCOPIC OVARIAN CYSTECTOMY;  Surgeon: Eldred Manges, MD;  Location: [facility];  Service: Gynecology;  Laterality: Left;   . LAPAROSCOPIC VAGINAL HYSTERECTOMY WITH SALPINGECTOMY Bilateral 03/11/2016   Procedure: LAPAROSCOPIC ASSISTED VAGINAL HYSTERECTOMY WITH SALPINGECTOMY;  Surgeon: Eldred Manges, MD;  Location: [facility];  Service: Gynecology;  Laterality: Bilateral;  . TUBAL LIGATION      Family History  Problem Relation Age of Onset  . Adopted: Yes  . Other Son     Growing pains  . Asthma Mother   . Heart Problems Mother   . Migraines Mother   . Diabetes Father   . Peptic Ulcer Father   . Heart Problems Father     Social history:  reports that she quit smoking about 3 years ago. Her smoking use included Cigarettes. She has a 4.25 pack-year smoking history. She has never used smokeless tobacco. She reports that she does not drink alcohol or use drugs.  Medications:  Prior to Admission medications   Medication Sig Start Date End Date Taking? Authorizing Provider  ALPRAZolam Duanne Moron) 1 MG tablet Take 1 mg by mouth daily as needed for anxiety.    Historical Provider, MD  esomeprazole (NEXIUM) 40 MG capsule Take 40 mg by mouth daily at 12 noon.    Historical Provider, MD  ferrous sulfate 325 (65 FE) MG tablet Take 1 tablet (325 mg total) by mouth 3 (three) times daily with meals. 11/22/15   Blanchie Dessert, MD  methocarbamol (ROBAXIN) 500 MG tablet Take 1 tablet (500 mg total) by mouth 4 (four) times daily. 10/02/16   Carlisle Cater, PA-C  mometasone-formoterol (DULERA) 100-5 MCG/ACT AERO  Inhale 2 puffs into the lungs 2 (two) times daily.    Historical Provider, MD  ondansetron (ZOFRAN) 4 MG tablet Take 1 tablet (4 mg total) by mouth every 6 (six) hours as needed for nausea. 03/12/16   Earnstine Regal, PA-C  phenytoin (DILANTIN) 100 MG ER capsule Take 3 capsules (300 mg total) by mouth at bedtime. Patient taking differently: Take 300 mg by mouth at bedtime. Pt states she was told to take Dilantin "as needed" and has not taken it in many months.  She states she has not had a seizure since prior to her last  neurology visit. 05/12/15   Merryl Hacker, MD  Vitamin D, Ergocalciferol, (DRISDOL) 50000 units CAPS capsule Take 50,000 Units by mouth every 7 (seven) days. Saturday    Historical Provider, MD      Allergies  Allergen Reactions  . Aspirin Anaphylaxis and Hives    Patient has received Ketorolac 3 times prior to surgical date 03/11/16 without sequale.      ROS:  Out of a complete 14 system review of symptoms, the patient complains only of the following symptoms, and all other reviewed systems are negative.  Fatigue Chest pain, swelling in the legs Blurred vision Shortness of breath, cough, wheezing, snoring Anemia, swollen lymph nodes Feeling hot, cold, increased thirst Joint pain, joint swelling, aching muscles Allergies Memory loss, headache, weakness, difficulty swallowing, dizziness, seizures, passing out Anxiety, not enough sleep Insomnia, snoring  Blood pressure 124/85, pulse 74, height 5\' 9"  (1.753 m), weight 282 lb (127.9 kg), last menstrual period 02/19/2016.  Physical Exam  General: The patient is alert and cooperative at the time of the examination. The patient is markedly obese.  Eyes: Pupils are equal, round, and reactive to light. Discs are flat bilaterally.  Neck: The neck is supple, no carotid bruits are noted.  Respiratory: The respiratory examination is clear.  Cardiovascular: The cardiovascular examination reveals a regular rate and rhythm, no obvious murmurs or rubs are noted.  Skin: Extremities are without significant edema.  Neurologic Exam  Mental status: The patient is alert and oriented x 3 at the time of the examination. The patient has apparent normal recent and remote memory, with an apparently normal attention span and concentration ability.  Cranial nerves: Facial symmetry is present. There is good sensation of the face to pinprick and soft touch bilaterally. The strength of the facial muscles and the muscles to head turning and shoulder  shrug are normal bilaterally. Speech is well enunciated, no aphasia or dysarthria is noted. Extraocular movements are full. Visual fields are full. The tongue is midline, and the patient has symmetric elevation of the soft palate. No obvious hearing deficits are noted.  Motor: The motor testing reveals 5 over 5 strength of all 4 extremities. Good symmetric motor tone is noted throughout.  Sensory: Sensory testing is intact to pinprick, soft touch, vibration sensation, and position sense on all 4 extremities. No evidence of extinction is noted.  Coordination: Cerebellar testing reveals good finger-nose-finger and heel-to-shin bilaterally.  Gait and station: Gait is normal. Tandem gait is normal. Romberg is negative. No drift is seen.  Reflexes: Deep tendon reflexes are symmetric and normal bilaterally. Toes are downgoing bilaterally.   MRI brain 05/23/15:  IMPRESSION: Unremarkable MRI of the brain.  * MRI scan images were reviewed online. I agree with the written report.    Assessment/Plan:  1. Intractable migraine headache  2. Partial complex seizures  The patient is not on a daily seizure medication,  she is having frequent, daily migraine headache. The patient will be placed on Topamax, the dose will be gradually increased. She will follow-up in 3 months, she will call for dose adjustments of the medication or if she is having side effects. She may be a candidate for Botox in the future, but she will require a trial on several other medications before Botox can be justified.  Jill Alexanders MD 10/07/2016 3:43 PM  [facility] [address] [place], [place] [address]  Phone [phone] Fax [phone]

## 2016-10-07 NOTE — Patient Instructions (Addendum)
Topamax (topiramate) is a seizure medication that has an FDA approval for seizures and for migraine headache. Potential side effects of this medication include weight loss, cognitive slowing, tingling in the fingers and toes, and carbonated drinks will taste bad. If any significant side effects are noted on this drug, please contact our office.  Migraine Headache A migraine headache is an intense, throbbing pain on one or both sides of your head. A migraine can last for 30 minutes to several hours. CAUSES  The exact cause of a migraine headache is not always known. However, a migraine may be caused when nerves in the brain become irritated and release chemicals that cause inflammation. This causes pain. Certain things may also trigger migraines, such as:  Alcohol.  Smoking.  Stress.  Menstruation.  Aged cheeses.  Foods or drinks that contain nitrates, glutamate, aspartame, or tyramine.  Lack of sleep.  Chocolate.  Caffeine.  Hunger.  Physical exertion.  Fatigue.  Medicines used to treat chest pain (nitroglycerine), birth control pills, estrogen, and some blood pressure medicines. SIGNS AND SYMPTOMS  Pain on one or both sides of your head.  Pulsating or throbbing pain.  Severe pain that prevents daily activities.  Pain that is aggravated by any physical activity.  Nausea, vomiting, or both.  Dizziness.  Pain with exposure to bright lights, loud noises, or activity.  General sensitivity to bright lights, loud noises, or smells. Before you get a migraine, you may get warning signs that a migraine is coming (aura). An aura may include:  Seeing flashing lights.  Seeing bright spots, halos, or zigzag lines.  Having tunnel vision or blurred vision.  Having feelings of numbness or tingling.  Having trouble talking.  Having muscle weakness. DIAGNOSIS  A migraine headache is often diagnosed based on:  Symptoms.  Physical exam.  A CT scan or MRI of your  head. These imaging tests cannot diagnose migraines, but they can help rule out other causes of headaches. TREATMENT Medicines may be given for pain and nausea. Medicines can also be given to help prevent recurrent migraines.  HOME CARE INSTRUCTIONS  Only take over-the-counter or prescription medicines for pain or discomfort as directed by your health care provider. The use of long-term narcotics is not recommended.  Lie down in a dark, quiet room when you have a migraine.  Keep a journal to find out what may trigger your migraine headaches. For example, write down:  What you eat and drink.  How much sleep you get.  Any change to your diet or medicines.  Limit alcohol consumption.  Quit smoking if you smoke.  Get 7-9 hours of sleep, or as recommended by your health care provider.  Limit stress.  Keep lights dim if bright lights bother you and make your migraines worse. SEEK IMMEDIATE MEDICAL CARE IF:   Your migraine becomes severe.  You have a fever.  You have a stiff neck.  You have vision loss.  You have muscular weakness or loss of muscle control.  You start losing your balance or have trouble walking.  You feel faint or pass out.  You have severe symptoms that are different from your first symptoms. MAKE SURE YOU:   Understand these instructions.  Will watch your condition.  Will get help right away if you are not doing well or get worse.   This information is not intended to replace advice given to you by your health care provider. Make sure you discuss any questions you have with your  health care provider.   Document Released: 12/13/2005 Document Revised: 01/03/2015 Document Reviewed: 08/20/2013 Elsevier Interactive Patient Education [employer].

## 2016-10-08 ENCOUNTER — Encounter (HOSPITAL_COMMUNITY): Payer: Self-pay | Admitting: *Deleted

## 2016-10-08 DIAGNOSIS — Y999 Unspecified external cause status: Secondary | ICD-10-CM | POA: Diagnosis not present

## 2016-10-08 DIAGNOSIS — W109XXA Fall (on) (from) unspecified stairs and steps, initial encounter: Secondary | ICD-10-CM | POA: Insufficient documentation

## 2016-10-08 DIAGNOSIS — Z87891 Personal history of nicotine dependence: Secondary | ICD-10-CM | POA: Diagnosis not present

## 2016-10-08 DIAGNOSIS — Y929 Unspecified place or not applicable: Secondary | ICD-10-CM | POA: Insufficient documentation

## 2016-10-08 DIAGNOSIS — Y939 Activity, unspecified: Secondary | ICD-10-CM | POA: Diagnosis not present

## 2016-10-08 DIAGNOSIS — M545 Low back pain: Secondary | ICD-10-CM | POA: Diagnosis not present

## 2016-10-08 NOTE — ED Triage Notes (Signed)
Pt c/o of lower back pain for awhile. Pt states she fell on Wednesday. Pt seen here for the fall. Pt also reports one day of vaginal bleeding yesterday. Pt states she has hx of hysterectomy and not sure why she was bleeding yesterday. Pt denies any foul odor or discharge.

## 2016-10-09 ENCOUNTER — Emergency Department (HOSPITAL_COMMUNITY): Payer: BLUE CROSS/BLUE SHIELD

## 2016-10-09 ENCOUNTER — Emergency Department (HOSPITAL_COMMUNITY)
Admission: EM | Admit: 2016-10-09 | Discharge: 2016-10-09 | Disposition: A | Discharge status: Home / Self Care | Payer: BLUE CROSS/BLUE SHIELD | Attending: Emergency Medicine | Admitting: Emergency Medicine

## 2016-10-09 ENCOUNTER — Encounter (HOSPITAL_COMMUNITY): Payer: Self-pay | Admitting: *Deleted

## 2016-10-09 DIAGNOSIS — M545 Low back pain, unspecified: Secondary | ICD-10-CM

## 2016-10-09 MED ORDER — MORPHINE SULFATE (PF) 4 MG/ML IV SOLN
4.0000 mg | Freq: Once | INTRAVENOUS | Status: AC
  Administered 2016-10-09: 4 mg via INTRAMUSCULAR
  Filled 2016-10-09: qty 1

## 2016-10-09 MED ORDER — HYDROCODONE-ACETAMINOPHEN 5-325 MG PO TABS: 1.0000 | ORAL_TABLET | Freq: Four times a day (QID) | ORAL | 0 refills | Status: DC | PRN

## 2016-10-09 NOTE — ED Notes (Signed)
Pt.s  Family member was picking her up.

## 2016-10-09 NOTE — ED Provider Notes (Signed)
[facility] DEPT Provider Note   CSN: [id number] Arrival date & time: 10/08/16  2305  By signing my name below, I, Eunice Blase, attest that this documentation has been prepared under the direction and in the presence of Veryl Speak, MD. Electronically signed, Eunice Blase, ED Scribe. 10/09/16. 4:09 AM.    History   Chief Complaint Chief Complaint  Patient presents with  . Back Pain  . Vaginal Bleeding   The history is provided by the patient. No language interpreter was used.    HPI Comments: Victoria Moore is a 40 y.o. female who presents to the Emergency Department complaining of gradual worsening lower back pain x a few months. She states she fell 3 days ago when her symptoms began to worsen. The pt reports slipping and falling on her bottom down the stairs. She reports associated pain upon standing and ambulating, and dizziness upon ambulating. She states she can not sit straight secondary to pain. Pt denies weakness of extremities and radiating pain into her legs. She was given hydrocodone for her lower back pain in the past. The pt also reports a headache.The pt had hysterectomy in march, and she notes a vaginal bleeding episode yesterday.    Past Medical History:  Diagnosis Date  . Anemia   . Anxiety   . Common migraine with intractable migraine 10/07/2016  . GERD (gastroesophageal reflux disease)   . Migraine   . Sarcoidosis ([place])   . Seizures ([place])    last one year ago    Patient Active Problem List   Diagnosis Date Noted  . Common migraine with intractable migraine 10/07/2016  . Dyspnea 04/15/2016  . Hilar adenopathy 04/15/2016  . Convulsions/seizures ([place]) 03/11/2016  . Fibroids 03/11/2016  . Anemia 11/12/2014  . Symptomatic anemia 11/12/2014  . Menorrhagia 11/12/2014  . Iron deficiency 11/12/2014  . Right-sided chest pain 11/12/2014    Past Surgical History:  Procedure Laterality Date  . ABDOMINAL HYSTERECTOMY    . LAPAROSCOPIC OVARIAN  CYSTECTOMY Left 03/11/2016   Procedure: LAPAROSCOPIC OVARIAN CYSTECTOMY;  Surgeon: Eldred Manges, MD;  Location: [facility];  Service: Gynecology;  Laterality: Left;  . LAPAROSCOPIC VAGINAL HYSTERECTOMY WITH SALPINGECTOMY Bilateral 03/11/2016   Procedure: LAPAROSCOPIC ASSISTED VAGINAL HYSTERECTOMY WITH SALPINGECTOMY;  Surgeon: Eldred Manges, MD;  Location: [facility];  Service: Gynecology;  Laterality: Bilateral;  . TUBAL LIGATION      OB History    Gravida Para Term Preterm AB Living   1             SAB TAB Ectopic Multiple Live Births                   Home Medications    Prior to Admission medications   Medication Sig Start Date End Date Taking? Authorizing Provider  ALPRAZolam Duanne Moron) 1 MG tablet Take 1 mg by mouth daily as needed for anxiety.    Historical Provider, MD  ferrous sulfate 325 (65 FE) MG tablet Take 1 tablet (325 mg total) by mouth 3 (three) times daily with meals. 11/22/15   Blanchie Dessert, MD  methocarbamol (ROBAXIN) 500 MG tablet Take 1 tablet (500 mg total) by mouth 4 (four) times daily. 10/02/16   Carlisle Cater, PA-C  mometasone-formoterol (DULERA) 100-5 MCG/ACT AERO Inhale 2 puffs into the lungs 2 (two) times daily.    Historical Provider, MD  topiramate (TOPAMAX) 25 MG tablet Take one tablet at night for one week, then take 2 tablets at night for one week, then take 3  tablets at night. 10/07/16   Kathrynn Ducking, MD  Vitamin D, Ergocalciferol, (DRISDOL) 50000 units CAPS capsule Take 50,000 Units by mouth every 7 (seven) days. Saturday    Historical Provider, MD    Family History Family History  Problem Relation Age of Onset  . Adopted: Yes  . Other Son     Growing pains  . Asthma Mother   . Heart Problems Mother   . Migraines Mother   . Diabetes Father   . Peptic Ulcer Father   . Heart Problems Father     Social History Social History  Substance Use Topics  . Smoking status: Former Smoker    Packs/day: 0.25    Years: 17.00    Types: Cigarettes      Quit date: 03/02/2013  . Smokeless tobacco: Never Used  . Alcohol use No     Comment: only on special occasions     Allergies   Aspirin   Review of Systems Review of Systems  Genitourinary: Positive for vaginal bleeding.  Musculoskeletal: Positive for back pain (non-radiating) and gait problem.  Neurological: Positive for dizziness. Negative for weakness.  All other systems reviewed and are negative.    Physical Exam Updated Vital Signs BP 112/69 (BP Location: Right Arm)   Pulse (!) 58   Temp 97.9 F (36.6 C) (Oral)   Resp 13   LMP 02/19/2016   SpO2 100%   Physical Exam  Constitutional: She is oriented to person, place, and time. She appears well-developed and well-nourished. No distress.  HENT:  Head: Normocephalic and atraumatic.  Eyes: EOM are normal.  Neck: Normal range of motion.  Cardiovascular: Normal rate, regular rhythm and normal heart sounds.   Pulmonary/Chest: Effort normal and breath sounds normal.  Abdominal: Soft. She exhibits no distension. There is no tenderness.  Musculoskeletal: Normal range of motion.  Neurological: She is alert and oriented to person, place, and time.  Skin: Skin is warm and dry.  Psychiatric: She has a normal mood and affect. Judgment normal.  Nursing note and vitals reviewed.    ED Treatments / Results  DIAGNOSTIC STUDIES: Oxygen Saturation is 100% on RA, normal by my interpretation.    COORDINATION OF CARE: 4:09 AM Will order pain medication and imaging. Discussed treatment plan with pt at bedside and pt agreed to plan.    Labs (all labs ordered are listed, but only abnormal results are displayed) Labs Reviewed  URINALYSIS, [place] (NOT AT [facility])  POC URINE PREG, ED    EKG  EKG Interpretation None       Radiology No results found.  Procedures Procedures (including critical care time)  Medications Ordered in ED Medications - No data to display   Initial Impression / Assessment  and Plan / ED Course  I have reviewed the triage vital signs and the nursing notes.  Pertinent labs & imaging results that were available during my care of the patient were reviewed by me and considered in my medical decision making (see chart for details).  Clinical Course    Patient presents with complaints of ongoing low back pain after a fall. The fall occurred when week ago, however her pain is not improving. Her physical examination is unremarkable. Her reflexes are symmetrical and strength is normal, and there are no red flags in her history or exam that would suggest an emergent situation. She will be discharged with pain medication and follow-up with her primary Dr. if not improving in the next week.  Final Clinical Impressions(s) / ED Diagnoses   Final diagnoses:  None    New Prescriptions New Prescriptions   No medications on file     I personally performed the services described in this documentation, which was scribed in my presence. The recorded information has been reviewed and is accurate.        Veryl Speak, MD 10/09/16 [phone]

## 2016-10-09 NOTE — Discharge Instructions (Signed)
Ibuprofen 600 mg 3 times daily for the next 5 days.  Hydrocodone as prescribed as needed for pain not relieved with ibuprofen.  Follow-up with your primary Dr. if you're not improving in the next week to discuss referral to physical therapy or possibly further imaging.

## 2016-10-13 ENCOUNTER — Other Ambulatory Visit: Payer: Self-pay | Admitting: Family Medicine

## 2016-10-13 DIAGNOSIS — D509 Iron deficiency anemia, unspecified: Secondary | ICD-10-CM

## 2016-10-13 DIAGNOSIS — R3129 Other microscopic hematuria: Secondary | ICD-10-CM

## 2016-10-17 ENCOUNTER — Emergency Department (HOSPITAL_COMMUNITY)
Admission: EM | Admit: 2016-10-17 | Discharge: 2016-10-17 | Disposition: A | Discharge status: Home / Self Care | Payer: BLUE CROSS/BLUE SHIELD | Attending: Emergency Medicine | Admitting: Emergency Medicine

## 2016-10-17 DIAGNOSIS — R112 Nausea with vomiting, unspecified: Secondary | ICD-10-CM | POA: Insufficient documentation

## 2016-10-17 DIAGNOSIS — M545 Low back pain, unspecified: Secondary | ICD-10-CM

## 2016-10-17 DIAGNOSIS — Z87891 Personal history of nicotine dependence: Secondary | ICD-10-CM | POA: Insufficient documentation

## 2016-10-17 DIAGNOSIS — R110 Nausea: Secondary | ICD-10-CM

## 2016-10-17 LAB — URINALYSIS, ROUTINE W REFLEX MICROSCOPIC
Bilirubin Urine: NEGATIVE
Glucose, UA: NEGATIVE mg/dL
Hgb urine dipstick: NEGATIVE
Ketones, ur: NEGATIVE mg/dL
Nitrite: NEGATIVE
Protein, ur: NEGATIVE mg/dL
Specific Gravity, Urine: 1.036 — ABNORMAL HIGH (ref 1.005–1.030)
pH: 5.5 (ref 5.0–8.0)

## 2016-10-17 LAB — CBC
HCT: 36.3 % (ref 36.0–46.0)
Hemoglobin: 11.5 g/dL — ABNORMAL LOW (ref 12.0–15.0)
MCH: 26.2 pg (ref 26.0–34.0)
MCHC: 31.7 g/dL (ref 30.0–36.0)
MCV: 82.7 fL (ref 78.0–100.0)
Platelets: 194 10*3/uL (ref 150–400)
RBC: 4.39 MIL/uL (ref 3.87–5.11)
RDW: 14.6 % (ref 11.5–15.5)
WBC: 5.3 10*3/uL (ref 4.0–10.5)

## 2016-10-17 LAB — COMPREHENSIVE METABOLIC PANEL
ALT: 13 U/L — ABNORMAL LOW (ref 14–54)
AST: 19 U/L (ref 15–41)
Albumin: 3.7 g/dL (ref 3.5–5.0)
Alkaline Phosphatase: 44 U/L (ref 38–126)
Anion gap: 12 (ref 5–15)
BUN: 15 mg/dL (ref 6–20)
CO2: 19 mmol/L — ABNORMAL LOW (ref 22–32)
Calcium: 9 mg/dL (ref 8.9–10.3)
Chloride: 107 mmol/L (ref 101–111)
Creatinine, Ser: 0.95 mg/dL (ref 0.44–1.00)
GFR calc Af Amer: >60 mL/min (ref >60)
GFR calc non Af Amer: >60 mL/min (ref >60)
Glucose, Bld: 126 mg/dL — ABNORMAL HIGH (ref 65–99)
Potassium: 3.7 mmol/L (ref 3.5–5.1)
Sodium: 138 mmol/L (ref 135–145)
Total Bilirubin: 0.3 mg/dL (ref 0.3–1.2)
Total Protein: 6.8 g/dL (ref 6.5–8.1)

## 2016-10-17 LAB — URINE MICROSCOPIC-ADD ON

## 2016-10-17 LAB — LIPASE, BLOOD: Lipase: 28 U/L (ref 11–51)

## 2016-10-17 MED ORDER — LIDOCAINE 5 % EX PTCH: 1.0000 | MEDICATED_PATCH | CUTANEOUS | 0 refills | Status: DC

## 2016-10-17 MED ORDER — ONDANSETRON 4 MG PO TBDP: 4.0000 mg | ORAL_TABLET | Freq: Three times a day (TID) | ORAL | 0 refills | Status: DC | PRN

## 2016-10-17 MED ORDER — METHOCARBAMOL 500 MG PO TABS: 500.0000 mg | ORAL_TABLET | Freq: Two times a day (BID) | ORAL | 0 refills | Status: DC

## 2016-10-17 MED ORDER — NAPROXEN 500 MG PO TABS
500.0000 mg | ORAL_TABLET | Freq: Two times a day (BID) | ORAL | 0 refills | Status: DC
Start: 2016-10-17 — End: 2016-11-10

## 2016-10-17 MED ORDER — NAPROXEN 250 MG PO TABS
500.0000 mg | ORAL_TABLET | Freq: Once | ORAL | Status: AC
  Administered 2016-10-17: 500 mg via ORAL
  Filled 2016-10-17: qty 2

## 2016-10-17 NOTE — Discharge Instructions (Signed)
You have been seen today for back pain and nausea with vomiting. Your lab tests showed no acute abnormalities.  Take it easy, but do not lay around too much as this may make the stiffness worse. Take 500 mg of naproxen every 12 hours or 800 mg of ibuprofen every 8 hours for the next 3 days. Take these medications with food to avoid upset stomach. Robaxin is a muscle relaxer and may help loosen stiff muscles. Do not take the Robaxin while driving or performing other dangerous activities.  Zofran for nausea or vomiting.  Follow up with PCP as needed for continued management of this issue. Return to ED should symptoms worsen.

## 2016-10-17 NOTE — ED Provider Notes (Signed)
[facility] DEPT Provider Note   CSN: [id number] Arrival date & time: 10/17/16  1502     History   Chief Complaint Chief Complaint  Patient presents with  . Back Pain  . Hematuria  . Emesis  . Nausea    HPI Victoria Moore is a 40 y.o. female.  HPI   Victoria Moore is a 40 y.o. female, with a history of anemia and anxiety, presenting to the ED with Bilateral lower back pain for the last month, worse over the last week. Patient was evaluated this past week by her PCP. PCP found minor hematuria and scheduled the patient for a CT scan, which she is scheduled to have tomorrow. Patient states that she came in today because she began vomiting yesterday with 2 episodes of emesis over the last 24 hours. Patient's lower back pain is currently mild, achy, nonradiating. Denies fever/chills, diarrhea/constipation, abdominal pain, abnormal vaginal discharge or bleeding, current nausea, or any other complaints.     Past Medical History:  Diagnosis Date  . Anemia   . Anxiety   . Common migraine with intractable migraine 10/07/2016  . GERD (gastroesophageal reflux disease)   . Migraine   . Sarcoidosis ([place])   . Seizures ([place])    last one year ago    Patient Active Problem List   Diagnosis Date Noted  . Common migraine with intractable migraine 10/07/2016  . Dyspnea 04/15/2016  . Hilar adenopathy 04/15/2016  . Convulsions/seizures ([place]) 03/11/2016  . Fibroids 03/11/2016  . Anemia 11/12/2014  . Symptomatic anemia 11/12/2014  . Menorrhagia 11/12/2014  . Iron deficiency 11/12/2014  . Right-sided chest pain 11/12/2014    Past Surgical History:  Procedure Laterality Date  . ABDOMINAL HYSTERECTOMY    . LAPAROSCOPIC OVARIAN CYSTECTOMY Left 03/11/2016   Procedure: LAPAROSCOPIC OVARIAN CYSTECTOMY;  Surgeon: Eldred Manges, MD;  Location: [facility];  Service: Gynecology;  Laterality: Left;  . LAPAROSCOPIC VAGINAL HYSTERECTOMY WITH SALPINGECTOMY Bilateral 03/11/2016   Procedure:  LAPAROSCOPIC ASSISTED VAGINAL HYSTERECTOMY WITH SALPINGECTOMY;  Surgeon: Eldred Manges, MD;  Location: [facility];  Service: Gynecology;  Laterality: Bilateral;  . TUBAL LIGATION      OB History    Gravida Para Term Preterm AB Living   1             SAB TAB Ectopic Multiple Live Births                   Home Medications    Prior to Admission medications   Medication Sig Start Date End Date Taking? Authorizing Provider  ALPRAZolam Duanne Moron) 1 MG tablet Take 1 mg by mouth daily as needed for anxiety.    Historical Provider, MD  ferrous sulfate 325 (65 FE) MG tablet Take 1 tablet (325 mg total) by mouth 3 (three) times daily with meals. 11/22/15   Blanchie Dessert, MD  HYDROcodone-acetaminophen (NORCO) 5-325 MG tablet Take 1-2 tablets by mouth every 6 (six) hours as needed. 10/09/16   Veryl Speak, MD  lidocaine (LIDODERM) 5 % Place 1 patch onto the skin daily. Remove & Discard patch within 12 hours or as directed by MD 10/17/16   Lorayne Bender, PA-C  methocarbamol (ROBAXIN) 500 MG tablet Take 1 tablet (500 mg total) by mouth 4 (four) times daily. 10/02/16   Carlisle Cater, PA-C  methocarbamol (ROBAXIN) 500 MG tablet Take 1 tablet (500 mg total) by mouth 2 (two) times daily. 10/17/16   Modupe Shampine C Jayceon [facility], PA-C  mometasone-formoterol (DULERA) 100-5 MCG/ACT AERO Inhale 2  puffs into the lungs 2 (two) times daily.    Historical Provider, MD  naproxen (NAPROSYN) 500 MG tablet Take 1 tablet (500 mg total) by mouth 2 (two) times daily. 10/17/16   Devann Cribb C Constantin Hillery, PA-C  ondansetron (ZOFRAN ODT) 4 MG disintegrating tablet Take 1 tablet (4 mg total) by mouth every 8 (eight) hours as needed for nausea or vomiting. 10/17/16   Bracen Schum C Argie Applegate, PA-C  topiramate (TOPAMAX) 25 MG tablet Take one tablet at night for one week, then take 2 tablets at night for one week, then take 3 tablets at night. 10/07/16   Kathrynn Ducking, MD  Vitamin D, Ergocalciferol, (DRISDOL) 50000 units CAPS capsule Take 50,000 Units by mouth every 7  (seven) days. Saturday    Historical Provider, MD    Family History Family History  Problem Relation Age of Onset  . Adopted: Yes  . Other Son     Growing pains  . Asthma Mother   . Heart Problems Mother   . Migraines Mother   . Diabetes Father   . Peptic Ulcer Father   . Heart Problems Father     Social History Social History  Substance Use Topics  . Smoking status: Former Smoker    Packs/day: 0.25    Years: 17.00    Types: Cigarettes    Quit date: 03/02/2013  . Smokeless tobacco: Never Used  . Alcohol use No     Comment: only on special occasions     Allergies   Aspirin   Review of Systems Review of Systems  Constitutional: Negative for chills, diaphoresis and fever.  Gastrointestinal: Positive for nausea (resolved) and vomiting (resolved). Negative for abdominal pain, blood in stool, constipation and diarrhea.  Genitourinary: Negative for dysuria, vaginal bleeding and vaginal discharge.  All other systems reviewed and are negative.    Physical Exam Updated Vital Signs BP 106/90 (BP Location: Right Arm)   Pulse 97   Temp 98.1 F (36.7 C) (Oral)   Resp 20   LMP 02/19/2016   SpO2 93%   Physical Exam  Constitutional: She appears well-developed and well-nourished. No distress.  HENT:  Head: Normocephalic and atraumatic.  Eyes: Conjunctivae are normal.  Neck: Neck supple.  Cardiovascular: Normal rate, regular rhythm, normal heart sounds and intact distal pulses.   Pulmonary/Chest: Effort normal and breath sounds normal. No respiratory distress.  Abdominal: Soft. There is no tenderness. There is no guarding.  Musculoskeletal: She exhibits no edema.  Tenderness to the bilateral lumbar musculature. Full ROM in all extremities and spine. No midline spinal tenderness.   Lymphadenopathy:    She has no cervical adenopathy.  Neurological: She is alert.  No sensory deficits. Strength 5/5 in all extremities. No gait disturbance. Coordination intact.   Skin: Skin  is warm and dry. She is not diaphoretic.  Psychiatric: She has a normal mood and affect. Her behavior is normal.  Nursing note and vitals reviewed.    ED Treatments / Results  Labs (all labs ordered are listed, but only abnormal results are displayed) Labs Reviewed  COMPREHENSIVE METABOLIC PANEL - Abnormal; Notable for the following:       Result Value   CO2 19 (*)    Glucose, Bld 126 (*)    ALT 13 (*)    All other components within normal limits  CBC - Abnormal; Notable for the following:    Hemoglobin 11.5 (*)    All other components within normal limits  URINALYSIS, ROUTINE W REFLEX MICROSCOPIC (NOT AT  [facility]) - Abnormal; Notable for the following:    Specific Gravity, Urine 1.036 (*)    Leukocytes, UA MODERATE (*)    All other components within normal limits  URINE MICROSCOPIC-ADD ON - Abnormal; Notable for the following:    Squamous Epithelial / LPF 0-5 (*)    Bacteria, UA RARE (*)    All other components within normal limits  LIPASE, BLOOD    EKG  EKG Interpretation None       Radiology No results found.  Procedures Procedures (including critical care time)  Medications Ordered in ED Medications  naproxen (NAPROSYN) tablet 500 mg (not administered)     Initial Impression / Assessment and Plan / ED Course  I have reviewed the triage vital signs and the nursing notes.  Pertinent labs & imaging results that were available during my care of the patient were reviewed by me and considered in my medical decision making (see chart for details).  Clinical Course    Patient presents with bilateral lower back pain for the last month. No neuro or functional deficits. No nausea or vomiting here in the ED. No hematuria or signs of infection on UA. Patient encouraged to follow-up with her PCP for continued evaluation and management.  Patient's severe allergy to aspirin was noted, however, patient states that she has taken medications like ibuprofen and naproxen in the  past without difficulty. Narcotic pain medications were avoided because the patient stated that she would be driving home from the ED. The patient was given instructions for home care as well as return precautions. Patient voices understanding of these instructions, accepts the plan, and is comfortable with discharge.  Vitals:   10/17/16 1523 10/17/16 2001  BP: 106/90 142/81  Pulse: 97 60  Resp: 20 18  Temp: 98.1 F (36.7 C) 98.8 F (37.1 C)  TempSrc: Oral Oral  SpO2: 93% 100%     Final Clinical Impressions(s) / ED Diagnoses   Final diagnoses:  Acute bilateral low back pain without sciatica  Nausea    New Prescriptions New Prescriptions   LIDOCAINE (LIDODERM) 5 %    Place 1 patch onto the skin daily. Remove & Discard patch within 12 hours or as directed by MD   METHOCARBAMOL (ROBAXIN) 500 MG TABLET    Take 1 tablet (500 mg total) by mouth 2 (two) times daily.   NAPROXEN (NAPROSYN) 500 MG TABLET    Take 1 tablet (500 mg total) by mouth 2 (two) times daily.   ONDANSETRON (ZOFRAN ODT) 4 MG DISINTEGRATING TABLET    Take 1 tablet (4 mg total) by mouth every 8 (eight) hours as needed for nausea or vomiting.     Lorayne Bender, PA-C 10/17/16 2003    Daleen Bo, MD 10/18/16 1026

## 2016-10-17 NOTE — ED Triage Notes (Signed)
Pt c/o lower back pain for awhile. Pt also reports, n/v and hematuria for a couple of weeks.

## 2016-10-18 ENCOUNTER — Ambulatory Visit
Admission: RE | Admit: 2016-10-18 | Discharge: 2016-10-18 | Disposition: A | Discharge status: Home / Self Care | Payer: BLUE CROSS/BLUE SHIELD | Source: Ambulatory Visit | Attending: Family Medicine | Admitting: Family Medicine

## 2016-10-18 DIAGNOSIS — D509 Iron deficiency anemia, unspecified: Secondary | ICD-10-CM

## 2016-10-18 DIAGNOSIS — R3129 Other microscopic hematuria: Secondary | ICD-10-CM

## 2016-10-18 MED ORDER — IOPAMIDOL (ISOVUE-300) INJECTION 61%
125.0000 mL | Freq: Once | INTRAVENOUS | Status: AC | PRN
  Administered 2016-10-18: 125 mL via INTRAVENOUS

## 2016-10-22 ENCOUNTER — Ambulatory Visit (INDEPENDENT_AMBULATORY_CARE_PROVIDER_SITE_OTHER): Payer: BLUE CROSS/BLUE SHIELD | Admitting: Internal Medicine

## 2016-10-22 ENCOUNTER — Encounter: Payer: Self-pay | Admitting: Internal Medicine

## 2016-10-22 ENCOUNTER — Ambulatory Visit (INDEPENDENT_AMBULATORY_CARE_PROVIDER_SITE_OTHER)
Admission: RE | Admit: 2016-10-22 | Discharge: 2016-10-22 | Disposition: A | Discharge status: Home / Self Care | Payer: BLUE CROSS/BLUE SHIELD | Source: Ambulatory Visit | Attending: Internal Medicine | Admitting: Internal Medicine

## 2016-10-22 ENCOUNTER — Other Ambulatory Visit: Payer: Self-pay | Admitting: Family Medicine

## 2016-10-22 ENCOUNTER — Other Ambulatory Visit (INDEPENDENT_AMBULATORY_CARE_PROVIDER_SITE_OTHER): Payer: BLUE CROSS/BLUE SHIELD

## 2016-10-22 VITALS — BP 120/80 | HR 86 | Ht 69.0 in | Wt 287.4 lb

## 2016-10-22 DIAGNOSIS — R0600 Dyspnea, unspecified: Secondary | ICD-10-CM

## 2016-10-22 DIAGNOSIS — M5431 Sciatica, right side: Secondary | ICD-10-CM

## 2016-10-22 DIAGNOSIS — J45991 Cough variant asthma: Secondary | ICD-10-CM

## 2016-10-22 DIAGNOSIS — R590 Localized enlarged lymph nodes: Secondary | ICD-10-CM

## 2016-10-22 DIAGNOSIS — Z6841 Body Mass Index (BMI) 40.0 and over, adult: Secondary | ICD-10-CM

## 2016-10-22 LAB — CBC WITH DIFFERENTIAL/PLATELET
Basophils Absolute: 0 10*3/uL (ref 0.0–0.1)
Basophils Relative: 0.5 % (ref 0.0–3.0)
Eosinophils Absolute: 0.2 10*3/uL (ref 0.0–0.7)
Eosinophils Relative: 3.7 % (ref 0.0–5.0)
HCT: 35 % — ABNORMAL LOW (ref 36.0–46.0)
Hemoglobin: 11.4 g/dL — ABNORMAL LOW (ref 12.0–15.0)
Lymphocytes Relative: 33.2 % (ref 12.0–46.0)
Lymphs Abs: 1.4 10*3/uL (ref 0.7–4.0)
MCHC: 32.4 g/dL (ref 30.0–36.0)
MCV: 81.8 fl (ref 78.0–100.0)
Monocytes Absolute: 0.4 10*3/uL (ref 0.1–1.0)
Monocytes Relative: 9 % (ref 3.0–12.0)
Neutro Abs: 2.2 10*3/uL (ref 1.4–7.7)
Neutrophils Relative %: 53.6 % (ref 43.0–77.0)
Platelets: 199 10*3/uL (ref 150.0–400.0)
RBC: 4.28 Mil/uL (ref 3.87–5.11)
RDW: 15.6 % — ABNORMAL HIGH (ref 11.5–15.5)
WBC: 4.1 10*3/uL (ref 4.0–10.5)

## 2016-10-22 LAB — BASIC METABOLIC PANEL
BUN: 14 mg/dL (ref 6–23)
CO2: 28 mEq/L (ref 19–32)
Calcium: 9.2 mg/dL (ref 8.4–10.5)
Chloride: 106 mEq/L (ref 96–112)
Creatinine, Ser: 0.9 mg/dL (ref 0.40–1.20)
GFR: 88.07 mL/min (ref >60.00)
Glucose, Bld: 107 mg/dL — ABNORMAL HIGH (ref 70–99)
Potassium: 4 mEq/L (ref 3.5–5.1)
Sodium: 139 mEq/L (ref 135–145)

## 2016-10-22 LAB — SEDIMENTATION RATE: Sed Rate: 41 mm/hr — ABNORMAL HIGH (ref 0–20)

## 2016-10-22 MED ORDER — MOMETASONE FURO-FORMOTEROL FUM 100-5 MCG/ACT IN AERO
2.0000 | INHALATION_SPRAY | Freq: Two times a day (BID) | RESPIRATORY_TRACT | 0 refills | Status: DC
Start: 2016-10-22 — End: 2016-11-10

## 2016-10-22 NOTE — Patient Instructions (Addendum)
Increase the dulera 100 to Take 2 puffs first thing in am and then another 2 puffs about 12 hours later.     Work on inhaler technique:  relax and gently blow all the way out then take a nice smooth deep breath back in, triggering the inhaler at same time you start breathing in.  Hold for up to 5 seconds if you can. Blow out thru nose. Rinse and gargle with water when done     Please remember to go to the lab and x-ray department downstairs for your tests - we will call you with the results when they are available.   Please schedule a follow up office visit in 2 weeks, sooner if needed with pfts at [facility] - bring all active medications/ inhalers

## 2016-10-22 NOTE — Progress Notes (Signed)
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 Sarcoid by CT Chest in 2015 with new cough/sob plus arthritis mostly in ankles  and sensation of sensitive skin treated with cycles of prednisone referred to pulmonary clinic 04/15/2016 by Dr Charlotte Crumb     History of Present Illness  04/15/2016 1st [facility] office visit/ Erum Cercone   Chief Complaint  Patient presents with  . Pulmonary Consult    Referred by Dr. Lujean Amel. 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 She c/o SOB since then, esp worse in the past 2 months. SOB comes and goes, and is worse with exertion. She sometimes wakes up in the night with SOB. She has occ cough- sometimes prod with min yellow sputum.    1 y h/o persistent daily doe x every time across the room - occ happens at rest   Cough brought on by smells/ assoc with mild dysphagia on nexium 40 mg daily / sometimes coughs so hard hurts in center of chest transient only  Can't lie flat x one year , ok propped up  No better on dulera 100 though inhaler she brought was on 0 and using just samples so far on 2 bid basis with very poor hfa - see a/p Breathing improves p mom's neb saba  rec Continue dulera 100 Take 2 puffs first thing in am and then another 2 puffs about 12 hours later.  Work on inhaler technique:  relax and gently blow all the way out then take a nice smooth deep breath back in, triggering the inhaler at same time you start breathing in.  Hold for up to 5 seconds if you can. Blow out thru nose. Rinse and gargle with water when done Continue nexium 40 mg   Take  30-60 min before first meal of the day and Pepcid (famotidine)  20 mg one in evening or  toward bedtime until return to office - this is the best way to tell whether stomach acid is contributing to your problem.   GERD Please remember to go to the  lab  downstairs for your tests - we will call you with the results when they are available. Please schedule a follow up office visit in 4 weeks, sooner if needed with all active  medications in hand including over the counters > did not go to lab as rec Add pfts on return at [facility] > did not return as rec      10/22/2016  Extended f/u ov/Sheray Grist to re-establish re: : ? Sarcoidosis / new back pain p fell 09/29/16  Chief Complaint  Patient presents with  . Acute Visit    Pt c/o joint pain "all over"- esp in her back and started approx 1 month ago. Pt also having non prod cough. She has noticed some blurred vision. She states she gets SOB just walking up any sort of incline.   onset of arthritis esp feet and ankles summer 2017 rash   Doe = MMRC1 = can walk nl pace, flat grade, can't hurry or go uphills or steps s sob   Cough is day > noct/ cough and sob better on dulera 100 despite poor hfa and only 2 at hs   No obvious day to day or daytime variability or assoc excess/ purulent  sputum or mucus plugs or hemoptysis or cp or chest tightness, subjective wheeze or overt sinus or hb symptoms. No unusual exp hx or h/o childhood pna/ asthma or knowledge of premature birth.  Sleeping ok without nocturnal  or early am exacerbation  of respiratory  c/o's or need for noct saba. Also denies any obvious fluctuation of symptoms with weather or environmental changes or other aggravating or alleviating factors except as outlined above   Current Medications, Allergies, Complete Past Medical History, Past Surgical History, Family History, and Social History were reviewed in [employer] record.  ROS  The following are not active complaints unless bolded sore throat, dysphagia, dental problems, itching, sneezing,  nasal congestion or excess/ purulent secretions, ear ache,   fever, chills, sweats, unintended wt loss, classically pleuritic or exertional cp,  orthopnea pnd or leg swelling, presyncope,  palpitations, abdominal pain, anorexia, nausea, vomiting, diarrhea  or change in bowel or bladder habits, change in stools or urine, dysuria,hematuria,  rash, arthralgias, visual complaints, headache, numbness, weakness or ataxia or problems with walking or coordination,  change in mood/affect or memory.                       Objective:   Physical Exam  amb obese bf  nad   10/22/2016      287   04/15/16 276 lb (125.193 kg)  03/11/16 270 lb (122.471 kg)  03/02/16 270 lb (122.471 kg)    Vital signs reviewed - Note on arrival 02 sats  99% on RA     HEENT: nl dentition, turbinates, and oropharynx. Nl external ear canals without cough reflex   NECK :  without JVD/Nodes/TM/ nl carotid upstrokes bilaterally   LUNGS: no acc muscle use,  Nl contour chest which is clear to A and P bilaterally without cough on insp or exp maneuvers   CV:  RRR  no s3 or murmur or increase in P2, no edema   ABD:  soft and nontender with nl inspiratory excursion in the supine position. No bruits or organomegaly, bowel sounds nl  MS:  Nl gait/ ext warm without deformities, calf tenderness, cyanosis or clubbing No obvious joint restrictions   SKIN: warm and dry without lesions    NEURO:  alert, approp, nl sensorium with  no motor deficits      CXR PA and Lateral:   10/22/2016 :    I personally reviewed images and agree with radiology impression as follows:    No active cardiopulmonary disease. No radiographic evidence of chest sarcoidosis. No change from prior study.   Labs ordered/ reviewed:      Chemistry      Component Value Date/Time   NA 139 10/22/2016 1623   K 4.0 10/22/2016 1623   CL 106 10/22/2016 1623   CO2 28 10/22/2016 1623   BUN 14 10/22/2016 1623   CREATININE 0.90 10/22/2016 1623      Component Value Date/Time   CALCIUM 9.2 10/22/2016 1623   ALKPHOS 44 10/17/2016 1556   AST 19 10/17/2016 1556   ALT 13 (L) 10/17/2016 1556   BILITOT 0.3 10/17/2016 1556        Lab  Results  Component Value Date   WBC 4.1 10/22/2016   HGB 11.4 (L) 10/22/2016   HCT 35.0 (L) 10/22/2016   MCV 81.8 10/22/2016   PLT 199.0 10/22/2016        Lab Results  Component Value Date   TSH 2.01 10/22/2016  Lab Results  Component Value Date   PROBNP 9.0 10/22/2016       Lab Results  Component Value Date   ESRSEDRATE 41 (H) 10/22/2016               Assessment & Plan:

## 2016-10-23 ENCOUNTER — Other Ambulatory Visit: Payer: BLUE CROSS/BLUE SHIELD

## 2016-10-23 DIAGNOSIS — J45991 Cough variant asthma: Secondary | ICD-10-CM | POA: Insufficient documentation

## 2016-10-23 DIAGNOSIS — E66811 Obesity, class 1: Secondary | ICD-10-CM | POA: Insufficient documentation

## 2016-10-23 HISTORY — DX: Cough variant asthma: J45.991

## 2016-10-23 HISTORY — DX: Morbid (severe) obesity due to excess calories: E66.01

## 2016-10-23 NOTE — Assessment & Plan Note (Addendum)
Body mass index is 42.44   Lab Results  Component Value Date   TSH 2.01 10/22/2016     Contributing to arthritic complaints/ back pai  tendency/ doe/reviewed the need and the process to achieve and maintain neg calorie balance > defer f/u primary care including intermittently monitoring thyroid status

## 2016-10-23 NOTE — Assessment & Plan Note (Addendum)
04/15/2016  Walked RA x 3 laps @ 185 ft each stopped due to  End of study, nl pace, no sob or desat    - Spirometry 04/15/2016  Very truncated exp loop effort dep portion only  - 10/22/2016  Walked RA x 3 laps @ 185 ft each stopped due to  End of study, slow pace, min sob/ no desat - full pfts rec 10/22/2016 >>>  Not explained by cxr and Symptoms are markedly disproportionate to objective findings and not clear this is a lung problem but pt does appear to have difficult airway management issues. DDX of  difficult airways management almost all start with A and  include Adherence, Ace Inhibitors, Acid Reflux, Active Sinus Disease, Alpha 1 Antitripsin deficiency, Anxiety masquerading as Airways dz,  ABPA,  Allergy(esp in young), Aspiration (esp in elderly), Adverse effects of meds,  Active smokers, A bunch of PE's (a small clot burden can't cause this syndrome unless there is already severe underlying pulm or vascular dz with poor reserve) plus two Bs  = Bronchiectasis and Beta blocker use..and one C= CHF   Adherence is always the initial "prime suspect" and is a multilayered concern that requires a "trust but verify" approach in every patient - starting with knowing how to use medications, especially inhalers, correctly, keeping up with refills and understanding the fundamental difference between maintenance and prns vs those medications only taken for a very short course and then stopped and not refilled.   ? Anxiety > usually at the bottom of this list of usual suspects but should be much higher on this pt's based on H and P and note already on psychotropics .   ? Allergy/asthma > see asthma  ? CHF > excluded with bnp <<100    I had an extended discussion with the patient reviewing all relevant studies completed to date and  lasting 25 minutes of a 40  minute extended office  visit  To re-establish outpt care with multiple complaints /issues addressed   Each maintenance medication was reviewed in  detail including most importantly the difference between maintenance and prns and under what circumstances the prns are to be triggered using an action plan format that is not reflected in the computer generated alphabetically organized AVS.    Please see instructions for details which were reviewed in writing and the patient given a copy highlighting the part that I personally wrote and discussed at today's ov.

## 2016-10-23 NOTE — Assessment & Plan Note (Addendum)
-   Spirometry 04/15/2016  Very truncated exp loop effort dep portion only  - Allergy profile 10/22/2016 >  Eos 0.2/  IgE  52 RAST POS grass/trees/ragweed  10/22/2016  After extensive coaching HFA effectiveness =    75% try duelra 100 2bid   Pattern is more c/w vcd on spirometry but she reports some resp to dulera so should first try it in the full dose and then return for pfts / regroup

## 2016-10-23 NOTE — Assessment & Plan Note (Addendum)
See CT 11/12/14 R hilar/ subcarinal : These are not significant by size criteria (size not reported)  - f/u cxr 02/03/2016 no vz adenopathy and nl on 10/22/2016  - ACE  10/22/2016  = 22   A good rule of thumb is that >95% of pts with active sarcoid in any organ will have some plain cxr changes - on the other hand  if there are active pulmonary symptoms the cxr will look much worse than the patient:  No evidence of either scenario here/ strongly doubt active dz   stongly doubt the back pain has anything to do with sarcoid and is temporally related to falling s radicular features at this point > ortho f/u

## 2016-10-25 ENCOUNTER — Encounter (INDEPENDENT_AMBULATORY_CARE_PROVIDER_SITE_OTHER): Payer: BLUE CROSS/BLUE SHIELD | Admitting: Internal Medicine

## 2016-10-25 DIAGNOSIS — R0600 Dyspnea, unspecified: Secondary | ICD-10-CM | POA: Diagnosis not present

## 2016-10-25 LAB — PULMONARY FUNCTION TEST
DL/VA % pred: 96 %
DL/VA: 5.29 ml/min/mmHg/L
DLCO cor % pred: 72 %
DLCO cor: 24.06 ml/min/mmHg
DLCO unc % pred: 70 %
DLCO unc: 23.44 ml/min/mmHg
FEF 25-75 Post: 3.72 L/sec
FEF 25-75 Pre: 3 L/sec
FEF2575-%Change-Post: 24 %
FEF2575-%Pred-Post: 114 %
FEF2575-%Pred-Pre: 92 %
FEV1-%Change-Post: 0 %
FEV1-%Pred-Post: 90 %
FEV1-%Pred-Pre: 90 %
FEV1-Post: 2.83 L
FEV1-Pre: 2.83 L
FEV1FVC-%Change-Post: 2 %
FEV1FVC-%Pred-Pre: 103 %
FEV6-%Change-Post: -2 %
FEV6-%Pred-Post: 84 %
FEV6-%Pred-Pre: 87 %
FEV6-Post: 3.19 L
FEV6-Pre: 3.28 L
FEV6FVC-%Change-Post: 0 %
FEV6FVC-%Pred-Post: 101 %
FEV6FVC-%Pred-Pre: 102 %
FVC-%Change-Post: -2 %
FVC-%Pred-Post: 83 %
FVC-%Pred-Pre: 85 %
FVC-Post: 3.2 L
FVC-Pre: 3.28 L
Post FEV1/FVC ratio: 88 %
Post FEV6/FVC ratio: 100 %
Pre FEV1/FVC ratio: 86 %
Pre FEV6/FVC Ratio: 100 %
RV % pred: 65 %
RV: 1.29 L
TLC % pred: 75 %
TLC: 4.56 L

## 2016-10-25 LAB — RESPIRATORY ALLERGY PROFILE REGION II ~~LOC~~
Allergen, A. alternata, m6: <0.1 kU/L
Allergen, C. Herbarum, M2: <0.1 kU/L
Allergen, Cedar tree, t12: <0.1 kU/L
Allergen, Comm Silver Birch, t9: 5.48 kU/L — ABNORMAL HIGH
Allergen, Cottonwood, t14: <0.1 kU/L
Allergen, D pternoyssinus,d7: <0.1 kU/L
Allergen, Mouse Urine Protein, e78: <0.1 kU/L
Allergen, Mulberry, t76: <0.1 kU/L
Allergen, Oak,t7: 2.55 kU/L — ABNORMAL HIGH
Allergen, P. notatum, m1: <0.1 kU/L
Aspergillus fumigatus, m3: <0.1 kU/L
Bermuda Grass: 0.49 kU/L — ABNORMAL HIGH
Box Elder IgE: <0.1 kU/L
Cat Dander: <0.1 kU/L
Cockroach: <0.1 kU/L
Common Ragweed: 0.19 kU/L — ABNORMAL HIGH
D. farinae: <0.1 kU/L
Dog Dander: <0.1 kU/L
Elm IgE: 0.25 kU/L — ABNORMAL HIGH
IgE (Immunoglobulin E), Serum: 52 kU/L (ref <115)
Johnson Grass: 0.59 kU/L — ABNORMAL HIGH
Pecan/Hickory Tree IgE: 5.02 kU/L — ABNORMAL HIGH
Rough Pigweed  IgE: <0.1 kU/L
Sheep Sorrel IgE: 0.17 kU/L — ABNORMAL HIGH
Timothy Grass: 5.6 kU/L — ABNORMAL HIGH

## 2016-10-25 LAB — BRAIN NATRIURETIC PEPTIDE: Pro B Natriuretic peptide (BNP): 9 pg/mL (ref 0.0–100.0)

## 2016-10-25 LAB — TSH: TSH: 2.01 u[IU]/mL (ref 0.35–4.50)

## 2016-10-25 LAB — ANGIOTENSIN CONVERTING ENZYME: Angiotensin-Converting Enzyme: 22 U/L (ref 9–67)

## 2016-10-25 NOTE — Progress Notes (Signed)
LMTCB

## 2016-10-26 ENCOUNTER — Encounter (HOSPITAL_COMMUNITY): Payer: Self-pay | Admitting: Emergency Medicine

## 2016-10-26 ENCOUNTER — Telehealth: Payer: Self-pay | Admitting: Internal Medicine

## 2016-10-26 ENCOUNTER — Encounter: Payer: Self-pay | Admitting: Internal Medicine

## 2016-10-26 ENCOUNTER — Emergency Department (HOSPITAL_COMMUNITY)
Admission: EM | Admit: 2016-10-26 | Discharge: 2016-10-26 | Disposition: A | Discharge status: Home / Self Care | Payer: BLUE CROSS/BLUE SHIELD | Attending: Emergency Medicine | Admitting: Emergency Medicine

## 2016-10-26 DIAGNOSIS — M545 Low back pain, unspecified: Secondary | ICD-10-CM

## 2016-10-26 DIAGNOSIS — G8929 Other chronic pain: Secondary | ICD-10-CM | POA: Diagnosis not present

## 2016-10-26 DIAGNOSIS — M25511 Pain in right shoulder: Secondary | ICD-10-CM | POA: Insufficient documentation

## 2016-10-26 DIAGNOSIS — Z87891 Personal history of nicotine dependence: Secondary | ICD-10-CM | POA: Diagnosis not present

## 2016-10-26 DIAGNOSIS — Z79899 Other long term (current) drug therapy: Secondary | ICD-10-CM | POA: Diagnosis not present

## 2016-10-26 LAB — COMPREHENSIVE METABOLIC PANEL
ALT: 15 U/L (ref 14–54)
AST: 22 U/L (ref 15–41)
Albumin: 4 g/dL (ref 3.5–5.0)
Alkaline Phosphatase: 51 U/L (ref 38–126)
Anion gap: 7 (ref 5–15)
BUN: 12 mg/dL (ref 6–20)
CO2: 22 mmol/L (ref 22–32)
Calcium: 8.6 mg/dL — ABNORMAL LOW (ref 8.9–10.3)
Chloride: 107 mmol/L (ref 101–111)
Creatinine, Ser: 0.93 mg/dL (ref 0.44–1.00)
GFR calc Af Amer: >60 mL/min (ref >60)
GFR calc non Af Amer: >60 mL/min (ref >60)
Glucose, Bld: 102 mg/dL — ABNORMAL HIGH (ref 65–99)
Potassium: 4.3 mmol/L (ref 3.5–5.1)
Sodium: 136 mmol/L (ref 135–145)
Total Bilirubin: 0.4 mg/dL (ref 0.3–1.2)
Total Protein: 7.7 g/dL (ref 6.5–8.1)

## 2016-10-26 LAB — CBC WITH DIFFERENTIAL/PLATELET
Basophils Absolute: 0 10*3/uL (ref 0.0–0.1)
Basophils Relative: 1 %
Eosinophils Absolute: 0.2 10*3/uL (ref 0.0–0.7)
Eosinophils Relative: 5 %
HCT: 35 % — ABNORMAL LOW (ref 36.0–46.0)
Hemoglobin: 11.5 g/dL — ABNORMAL LOW (ref 12.0–15.0)
Lymphocytes Relative: 29 %
Lymphs Abs: 1.1 10*3/uL (ref 0.7–4.0)
MCH: 27 pg (ref 26.0–34.0)
MCHC: 32.9 g/dL (ref 30.0–36.0)
MCV: 82.2 fL (ref 78.0–100.0)
Monocytes Absolute: 0.2 10*3/uL (ref 0.1–1.0)
Monocytes Relative: 6 %
Neutro Abs: 2.3 10*3/uL (ref 1.7–7.7)
Neutrophils Relative %: 59 %
Platelets: 221 10*3/uL (ref 150–400)
RBC: 4.26 MIL/uL (ref 3.87–5.11)
RDW: 14.6 % (ref 11.5–15.5)
WBC: 3.8 10*3/uL — ABNORMAL LOW (ref 4.0–10.5)

## 2016-10-26 LAB — URINE MICROSCOPIC-ADD ON: RBC / HPF: NONE SEEN RBC/hpf (ref 0–5)

## 2016-10-26 LAB — PREGNANCY, URINE: Preg Test, Ur: NEGATIVE

## 2016-10-26 LAB — URINALYSIS, ROUTINE W REFLEX MICROSCOPIC
Bilirubin Urine: NEGATIVE
Glucose, UA: NEGATIVE mg/dL
Hgb urine dipstick: NEGATIVE
Ketones, ur: NEGATIVE mg/dL
Nitrite: NEGATIVE
Protein, ur: NEGATIVE mg/dL
Specific Gravity, Urine: 1.026 (ref 1.005–1.030)
pH: 7.5 (ref 5.0–8.0)

## 2016-10-26 MED ORDER — PREDNISONE 10 MG PO TABS: ORAL_TABLET | ORAL | 0 refills | Status: DC

## 2016-10-26 MED ORDER — DEXAMETHASONE SODIUM PHOSPHATE 10 MG/ML IJ SOLN
10.0000 mg | Freq: Once | INTRAMUSCULAR | Status: AC
  Administered 2016-10-26: 10 mg via INTRAMUSCULAR
  Filled 2016-10-26: qty 1

## 2016-10-26 MED ORDER — MORPHINE SULFATE (PF) 2 MG/ML IV SOLN
4.0000 mg | Freq: Once | INTRAVENOUS | Status: AC
  Administered 2016-10-26: 4 mg via INTRAMUSCULAR
  Filled 2016-10-26: qty 2

## 2016-10-26 NOTE — ED Provider Notes (Signed)
[facility] DEPT Provider Note   CSN: [id number] Arrival date & time: 10/26/16  1253     History   Chief Complaint Chief Complaint  Patient presents with  . Back Pain  . Flank Pain    HPI Victoria Moore is a 40 y.o. female.  Pt presents to the ED today because of back pain and blood in her mouth.  The pt has had back pain for over a month.  She has had ct scan of her abd/pelvis on 10/24 which was normal.  The pt said she called Dr. Melvyn Novas this morning because she woke up with blood in her mouth.  She said told her to come to the ED.  However, the telephone conversation is on EPIC and he ordered her a prednisone taper and told her to stop NSAIDS.  He told her to come to the ED if sx worsen.  The pt said she has not had worsening of her sx, but does not know why she woke up with blood in her mouth.  Pt said that she has been on hydrocodone and oxycodone for her back, but nothing is helping.  She pt is scheduled for a MRI soon.      Past Medical History:  Diagnosis Date  . Anemia   . Anxiety   . Common migraine with intractable migraine 10/07/2016  . GERD (gastroesophageal reflux disease)   . Migraine   . Sarcoidosis ([place])   . Seizures ([place])    last one year ago    Patient Active Problem List   Diagnosis Date Noted  . Morbid (severe) obesity due to excess calories ([place]) 10/23/2016  . Cough variant asthma vs UACS  10/23/2016  . Common migraine with intractable migraine 10/07/2016  . Dyspnea 04/15/2016  . Hilar adenopathy 04/15/2016  . Convulsions/seizures ([place]) 03/11/2016  . Fibroids 03/11/2016  . Anemia 11/12/2014  . Symptomatic anemia 11/12/2014  . Menorrhagia 11/12/2014  . Iron deficiency 11/12/2014  . Right-sided chest pain 11/12/2014    Past Surgical History:  Procedure Laterality Date  . ABDOMINAL HYSTERECTOMY    . LAPAROSCOPIC OVARIAN CYSTECTOMY Left 03/11/2016   Procedure: LAPAROSCOPIC OVARIAN CYSTECTOMY;  Surgeon: Eldred Manges, MD;  Location: [facility];  Service: Gynecology;  Laterality: Left;  . LAPAROSCOPIC VAGINAL HYSTERECTOMY WITH SALPINGECTOMY Bilateral 03/11/2016   Procedure: LAPAROSCOPIC ASSISTED VAGINAL HYSTERECTOMY WITH SALPINGECTOMY;  Surgeon: Eldred Manges, MD;  Location: [facility];  Service: Gynecology;  Laterality: Bilateral;  . TUBAL LIGATION      OB History    Gravida Para Term Preterm AB Living   1             SAB TAB Ectopic Multiple Live Births                   Home Medications    Prior to Admission medications   Medication Sig Start Date End Date Taking? Authorizing Provider  ALPRAZolam Duanne Moron) 1 MG tablet Take 1 mg by mouth daily as needed for anxiety.   Yes Historical Provider, MD  esomeprazole (NEXIUM) 40 MG capsule Take 40 mg by mouth daily. 10/19/16  Yes Historical Provider, MD  ferrous sulfate 325 (65 FE) MG tablet Take 1 tablet (325 mg total) by mouth 3 (three) times daily with meals. 11/22/15  Yes Blanchie Dessert, MD  mometasone-formoterol (DULERA) 100-5 MCG/ACT AERO Inhale 2 puffs into the lungs 2 (two) times daily. 10/22/16  Yes Tanda Rockers, MD  naproxen (NAPROSYN) 500 MG tablet Take 1 tablet (500  mg total) by mouth 2 (two) times daily. 10/17/16  Yes Shawn C Joy, PA-C  oxyCODONE-acetaminophen (PERCOCET/ROXICET) 5-325 MG tablet TAKE 1 TABLET PO EVERY 6 HRS PRN PAIN 10/19/16  Yes Historical Provider, MD  rizatriptan (MAXALT) 10 MG tablet TAKE 1 TABLET BY MOUTH ONCE A DAY PRN FOR MIGRAINES 10/20/16  Yes Historical Provider, MD  topiramate (TOPAMAX) 25 MG tablet Take one tablet at night for one week, then take 2 tablets at night for one week, then take 3 tablets at night. 10/07/16  Yes Kathrynn Ducking, MD  HYDROcodone-acetaminophen (NORCO) 5-325 MG tablet Take 1-2 tablets by mouth every 6 (six) hours as needed. Patient not taking: Reported on 10/26/2016 10/09/16   Veryl Speak, MD  methocarbamol (ROBAXIN) 500 MG tablet Take 1 tablet (500 mg total) by mouth 4 (four) times daily. Patient not taking:  Reported on 10/26/2016 10/02/16   Carlisle Cater, PA-C  ondansetron (ZOFRAN ODT) 4 MG disintegrating tablet Take 1 tablet (4 mg total) by mouth every 8 (eight) hours as needed for nausea or vomiting. 10/17/16   Shawn C Joy, PA-C  predniSONE (DELTASONE) 10 MG tablet Take 4 tablets X 2 days, 2 tablets X 2 days, 1 tablet X 2 days and stop 10/26/16   Tanda Rockers, MD  Vitamin D, Ergocalciferol, (DRISDOL) 50000 units CAPS capsule Take 50,000 Units by mouth every 7 (seven) days. Tuesday    Historical Provider, MD    Family History Family History  Problem Relation Age of Onset  . Adopted: Yes  . Other Son     Growing pains  . Asthma Mother   . Heart Problems Mother   . Migraines Mother   . Diabetes Father   . Peptic Ulcer Father   . Heart Problems Father     Social History Social History  Substance Use Topics  . Smoking status: Former Smoker    Packs/day: 0.25    Years: 17.00    Types: Cigarettes    Quit date: 03/02/2013  . Smokeless tobacco: Never Used  . Alcohol use No     Comment: only on special occasions     Allergies   Aspirin   Review of Systems Review of Systems  HENT:       Blood in mouth  Musculoskeletal: Positive for back pain.  All other systems reviewed and are negative.    Physical Exam Updated Vital Signs BP (!) 132/109 (BP Location: Left Arm)   Pulse 80   Temp 98.9 F (37.2 C)   Resp 18   Ht 5\' 9"  (1.753 m)   Wt 288 lb (130.6 kg)   LMP 02/19/2016   SpO2 100%   BMI 42.53 kg/m   Physical Exam  Constitutional: She is oriented to person, place, and time. She appears well-developed and well-nourished.  HENT:  Head: Normocephalic and atraumatic.  Right Ear: External ear normal.  Left Ear: External ear normal.  Nose: Nose normal.  Mouth/Throat: Oropharynx is clear and moist.  Eyes: Conjunctivae and EOM are normal. Pupils are equal, round, and reactive to light.  Neck: Normal range of motion.  Cardiovascular: Normal rate, regular rhythm, normal  heart sounds and intact distal pulses.   Pulmonary/Chest: Effort normal and breath sounds normal.  Abdominal: Soft. Bowel sounds are normal.  Musculoskeletal: Normal range of motion.       Right shoulder: She exhibits tenderness.  Neurological: She is alert and oriented to person, place, and time. She has normal reflexes.  Skin: Skin is warm.  Psychiatric:  She has a normal mood and affect. Her behavior is normal. Judgment and thought content normal.  Nursing note and vitals reviewed.    ED Treatments / Results  Labs (all labs ordered are listed, but only abnormal results are displayed) Labs Reviewed  URINALYSIS, ROUTINE W REFLEX MICROSCOPIC (NOT AT [facility])  COMPREHENSIVE METABOLIC PANEL  CBC WITH DIFFERENTIAL/PLATELET  PREGNANCY, URINE    EKG  EKG Interpretation None       Radiology No results found.  Procedures Procedures (including critical care time)  Medications Ordered in ED Medications  dexamethasone (DECADRON) injection 10 mg (10 mg Intramuscular Given 10/26/16 1638)  morphine 2 MG/ML injection 4 mg (4 mg Intramuscular Given 10/26/16 1638)     Initial Impression / Assessment and Plan / ED Course  I have reviewed the triage vital signs and the nursing notes.  Pertinent labs & imaging results that were available during my care of the patient were reviewed by me and considered in my medical decision making (see chart for details).  Clinical Course   Pt's pain is chronic.  I told her that we would not supply her with narcotics for her chronic pain medications.  Dr. Melvyn Novas wanted her to avoid NSAIDS, so pt is encouraged to pick up the prednisone rx sent in by Dr. Melvyn Novas.  Pt knows to f/u with her pcp regarding her chronic pain.  Pt told to get her rx of prednisone and to take tylenol for pain.  She knows to return if worse.  Final Clinical Impressions(s) / ED Diagnoses   Final diagnoses:  Chronic bilateral low back pain without sciatica    New Prescriptions New  Prescriptions   No medications on file     Isla Pence, MD 10/26/16 1812

## 2016-10-26 NOTE — ED Notes (Signed)
MD at bedside. 

## 2016-10-26 NOTE — Telephone Encounter (Signed)
Spoke with Victoria Moore, c/o hemoptysis X2 weeks.  Victoria Moore states she has been to ED and PCP for this, PCP also noted hematuria.  Victoria Moore also c/o back pain.  Denies any physical injury, new medications since symptoms started.  Victoria Moore states hemoptysis is worsening, that she woke up with "mouth full of blood this morning".   Victoria Moore is requesting further recs on this.    MW please advise.  Thanks   Patient Instructions   Increase the dulera 100 to Take 2 puffs first thing in am and then another 2 puffs about 12 hours later.      Work on inhaler technique:  relax and gently blow all the way out then take a nice smooth deep breath back in, triggering the inhaler at same time you start breathing in.  Hold for up to 5 seconds if you can. Blow out thru nose. Rinse and gargle with water when done      Please remember to go to the lab and x-ray department downstairs for your tests - we will call you with the results when they are available.     Please schedule a follow up office visit in 2 weeks, sooner if needed with pfts at [place] first - bring all active medications/ inhalers

## 2016-10-26 NOTE — ED Notes (Signed)
I attempted to collect labs however I did not feel anything I made the nurse aware and he is going to try

## 2016-10-26 NOTE — ED Notes (Signed)
Lab arrived for blood draw.

## 2016-10-26 NOTE — ED Notes (Signed)
Pt given urine cup for UA collection

## 2016-10-26 NOTE — ED Notes (Signed)
Pt ambulated to restroom with a steady gait

## 2016-10-26 NOTE — ED Notes (Signed)
Lab en route to get bloodwork. Spoke to [employer]

## 2016-10-26 NOTE — Telephone Encounter (Signed)
Pt aware of MW recommendations. Rx sent to preferred pharmacy. Pt voiced understanding and had no further questions. Nothing further needed.

## 2016-10-26 NOTE — Progress Notes (Signed)
lmtcb

## 2016-10-26 NOTE — ED Notes (Signed)
Pt reminded of need for urine. States "I just drank some water, I will go soon." Will revisit after blood draw.

## 2016-10-26 NOTE — ED Notes (Signed)
Writer was unable to stick pt for blood work

## 2016-10-26 NOTE — Telephone Encounter (Signed)
Most likely this is from nsaids which  she should stop (and any asa if applicable) and try Prednisone 10 mg take  4 each am x 2 days,   2 each am x 2 days,  1 each am x 2 days and stop   To er if worsens

## 2016-10-26 NOTE — ED Triage Notes (Signed)
Pt c/o new back pain and side pain. States she has been seen by her PCP and other emergency facility for blood in urine and other symtoms. Today pain in back and side have gotten worse. Denies fever, vom/diarh.

## 2016-10-27 ENCOUNTER — Other Ambulatory Visit: Payer: BLUE CROSS/BLUE SHIELD

## 2016-10-27 NOTE — Progress Notes (Signed)
Called her cell and NA, mailbox is full LMTCB on home phone

## 2016-10-28 ENCOUNTER — Telehealth: Payer: Self-pay | Admitting: Internal Medicine

## 2016-10-28 NOTE — Telephone Encounter (Signed)
I called and spoke with the pt and notified of results of labs and cxr per MW  She verbalized understanding and nothing further needed

## 2016-10-28 NOTE — Progress Notes (Signed)
Spoke with pt and notified of results per Dr. Wert. Pt verbalized understanding and denied any questions. 

## 2016-11-07 ENCOUNTER — Ambulatory Visit
Admission: RE | Admit: 2016-11-07 | Discharge: 2016-11-07 | Disposition: A | Discharge status: Home / Self Care | Payer: BLUE CROSS/BLUE SHIELD | Source: Ambulatory Visit | Attending: Family Medicine | Admitting: Family Medicine

## 2016-11-07 DIAGNOSIS — M5431 Sciatica, right side: Secondary | ICD-10-CM

## 2016-11-10 ENCOUNTER — Encounter: Payer: Self-pay | Admitting: Internal Medicine

## 2016-11-10 ENCOUNTER — Ambulatory Visit (INDEPENDENT_AMBULATORY_CARE_PROVIDER_SITE_OTHER): Payer: BLUE CROSS/BLUE SHIELD | Admitting: Internal Medicine

## 2016-11-10 VITALS — BP 158/94 | HR 95 | Ht 69.0 in | Wt 285.0 lb

## 2016-11-10 DIAGNOSIS — M19079 Primary osteoarthritis, unspecified ankle and foot: Secondary | ICD-10-CM | POA: Diagnosis not present

## 2016-11-10 DIAGNOSIS — I10 Essential (primary) hypertension: Secondary | ICD-10-CM

## 2016-11-10 DIAGNOSIS — J45991 Cough variant asthma: Secondary | ICD-10-CM

## 2016-11-10 DIAGNOSIS — R590 Localized enlarged lymph nodes: Secondary | ICD-10-CM

## 2016-11-10 HISTORY — DX: Primary osteoarthritis, unspecified ankle and foot: M19.079

## 2016-11-10 MED ORDER — MOMETASONE FURO-FORMOTEROL FUM 100-5 MCG/ACT IN AERO: 2.0000 | INHALATION_SPRAY | Freq: Two times a day (BID) | RESPIRATORY_TRACT | 0 refills | Status: DC

## 2016-11-10 NOTE — Patient Instructions (Addendum)
Tylenol arthritis as needed  Please see patient coordinator before you leave today  to schedule rheumatologist / Dr Charlestine Night   Please schedule a follow up visit in 3 months but call sooner if needed

## 2016-11-10 NOTE — Progress Notes (Signed)
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 Sarcoid by CT Chest in 2015 with new cough/sob plus arthritis mostly in ankles  and sensation of sensitive skin treated with cycles of prednisone referred to pulmonary clinic 04/15/2016 by Dr Charlotte Crumb     History of Present Illness  04/15/2016 1st [facility] office visit/ Victoria Moore   Chief Complaint  Patient presents with  . Pulmonary Consult    Referred by Dr. Lujean Amel. 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 She c/o SOB since then, esp worse in the past 2 months. SOB comes and goes, and is worse with exertion. She sometimes wakes up in the night with SOB. She has occ cough- sometimes prod with min yellow sputum.    1 y h/o persistent daily doe x every time across the room - occ happens at rest   Cough brought on by smells/ assoc with mild dysphagia on nexium 40 mg daily / sometimes coughs so hard hurts in center of chest transient only  Can't lie flat x one year , ok propped up  No better on dulera 100 though inhaler she brought was on 0 and using just samples so far on 2 bid basis with very poor hfa - see a/p Breathing improves p mom's neb saba  rec Continue dulera 100 Take 2 puffs first thing in am and then another 2 puffs about 12 hours later.  Work on inhaler technique:  relax and gently blow all the way out then take a nice smooth deep breath back in, triggering the inhaler at same time you start breathing in.  Hold for up to 5 seconds if you can. Blow out thru nose. Rinse and gargle with water when done Continue nexium 40 mg   Take  30-60 min before first meal of the day and Pepcid (famotidine)  20 mg one in evening or  toward bedtime until return to office - this is the best way to tell whether stomach acid is contributing to your problem.   GERD Please remember to go to the  lab  downstairs for your tests - we will call you with the results when they are available. Please schedule a follow up office visit in 4 weeks, sooner if needed with all active  medications in hand including over the counters > did not go to lab as rec Add pfts on return at [facility] > did not return as rec      10/22/2016  Extended f/u ov/Victoria Moore to re-establish re: : ? Sarcoidosis / new back pain p fell 09/29/16  Chief Complaint  Patient presents with  . Acute Visit    Pt c/o joint pain "all over"- esp in her back and started approx 1 month ago. Pt also having non prod cough. She has noticed some blurred vision. She states she gets SOB just walking up any sort of incline.   onset of arthritis esp feet and ankles summer 2017 rash   Doe = MMRC1 = can walk nl pace, flat grade, can't hurry or go uphills or steps s sob   Cough is day > noct/ cough and sob better on dulera 100 despite poor hfa and only 2 at hs rec Increase the dulera 100 to Take 2 puffs first thing in am and  then another 2 puffs about 12 hours later.  Work on inhaler technique:  Please remember to go to the lab and x-ray department downstairs for your tests - we will call you with the results when they are available. Please schedule a follow up office visit in 2 weeks, sooner if needed with pfts at [place] first - bring all active medications/ inhalers > did not do    11/10/2016  f/u ov/Victoria Moore re: ? Sarcoid related joint complaints  Chief Complaint  Patient presents with  . Follow-up    Still having joint back and back pain. Her cough and SOB have improved. No new co's today.    completely eliminated all symptoms on prednisone, joint pain  came back 3-4 days later but cough/ sob did not    No obvious day to day or daytime variability or assoc excess/ purulent sputum or mucus plugs or hemoptysis or cp or chest tightness, subjective wheeze or overt sinus or hb symptoms. No unusual exp hx or h/o childhood pna/ asthma or  knowledge of premature birth.  Sleeping ok without nocturnal  or early am exacerbation  of respiratory  c/o's or need for noct saba. Also denies any obvious fluctuation of symptoms with weather or environmental changes or other aggravating or alleviating factors except as outlined above   Current Medications, Allergies, Complete Past Medical History, Past Surgical History, Family History, and Social History were reviewed in [employer] record.  ROS  The following are not active complaints unless bolded sore throat, dysphagia, dental problems, itching, sneezing,  nasal congestion or excess/ purulent secretions, ear ache,   fever, chills, sweats, unintended wt loss, classically pleuritic or exertional cp,  orthopnea pnd or leg swelling, presyncope, palpitations, abdominal pain, anorexia, nausea, vomiting, diarrhea  or change in bowel or bladder habits, change in stools or urine, dysuria,hematuria,  rash, arthralgias, visual complaints, headache, numbness, weakness or ataxia or problems with walking or coordination,  change in mood/affect or memory.                       Objective:   Physical Exam  amb obese bf  nad    11/10/2016       285  10/22/2016      287   04/15/16 276 lb (125.193 kg)  03/11/16 270 lb (122.471 kg)  03/02/16 270 lb (122.471 kg)    Vital signs reviewed - Note on arrival 02 sats  100% on RA  And BP 158/94    HEENT: nl dentition, turbinates, and oropharynx. Nl external ear canals without cough reflex   NECK :  without JVD/Nodes/TM/ nl carotid upstrokes bilaterally   LUNGS: no acc muscle use,  Nl contour chest which is clear to A and P bilaterally without cough on insp or exp maneuvers   CV:  RRR  no s3 or murmur or increase in P2, no edema   ABD:  soft and nontender with nl inspiratory excursion in the supine position. No bruits or organomegaly, bowel sounds nl  MS:  Nl gait/ ext warm without deformities, calf tenderness, cyanosis or  clubbing No obvious joint restrictions   SKIN: warm and dry without lesions    NEURO:  alert, approp, nl sensorium with  no motor deficits      CXR PA and Lateral:   10/22/2016 :    I personally reviewed images and agree with radiology impression as follows:    No active cardiopulmonary disease. No radiographic  evidence of chest sarcoidosis. No change from prior study.   Labs reviewed:      Chemistry      Component Value Date/Time   NA 139 10/22/2016 1623   K 4.0 10/22/2016 1623   CL 106 10/22/2016 1623   CO2 28 10/22/2016 1623   BUN 14 10/22/2016 1623   CREATININE 0.90 10/22/2016 1623      Component Value Date/Time   CALCIUM 9.2 10/22/2016 1623   ALKPHOS 44 10/17/2016 1556   AST 19 10/17/2016 1556   ALT 13 (L) 10/17/2016 1556   BILITOT 0.3 10/17/2016 1556       Protein 7.7  To Alb  4.0                    10/26/16     Lab Results  Component Value Date   WBC 4.1 10/22/2016   HGB 11.4 (L) 10/22/2016   HCT 35.0 (L) 10/22/2016   MCV 81.8 10/22/2016   PLT 199.0 10/22/2016        Lab Results  Component Value Date   TSH 2.01 10/22/2016     Lab Results  Component Value Date   PROBNP 9.0 10/22/2016       Lab Results  Component Value Date   ESRSEDRATE 41 (H) 10/22/2016               Assessment & Plan:

## 2016-11-15 DIAGNOSIS — I10 Essential (primary) hypertension: Secondary | ICD-10-CM | POA: Insufficient documentation

## 2016-11-15 HISTORY — DX: Essential (primary) hypertension: I10

## 2016-11-15 NOTE — Assessment & Plan Note (Addendum)
See CT 11/12/14 R hilar/ subcarinal : These are not significant by size criteria (size not reported)  - f/u cxr 02/03/2016 no vz adenopathy and nl on 10/22/2016  - ACE  10/22/2016  = 22  - PFTs  10/25/16  FVC  3.28 (85%) s obstruction and dlco 70 corrects to 96%  No evidence of active sarcoid though ankle pains are worrisome > see arthritis a/p  I had an extended discussion with the patient reviewing all relevant studies completed to date and  lasting 15 to 20 minutes of a 25 minute visit    Each maintenance medication was reviewed in detail including most importantly the difference between maintenance and prns and under what circumstances the prns are to be triggered using an action plan format that is not reflected in the computer generated alphabetically organized AVS.    Please see instructions for details which were reviewed in writing and the patient given a copy highlighting the part that I personally wrote and discussed at today's ov.

## 2016-11-15 NOTE — Assessment & Plan Note (Signed)
Body mass index is 42.09  - no change  Lab Results  Component Value Date   TSH 2.01 10/22/2016     Contributing to gerd tendency/ doe/hbp reviewed the need and the process to achieve and maintain neg calorie balance > defer f/u primary care including intermittently monitoring thyroid status

## 2016-11-15 NOTE — Assessment & Plan Note (Signed)
Probably related to over use of nsaids which she says don't really work anyway > rec avoid salt and use tylenol arthritis for now  Follow up per Primary Care planned

## 2016-11-15 NOTE — Assessment & Plan Note (Signed)
Response to steroids with high esr does suggest inflammatory source but with neg cxr and nl prot/alb ratio and serum calcium much less likely to be sarcoid > refer to Rheum next

## 2016-11-15 NOTE — Assessment & Plan Note (Signed)
-  Spirometry 04/15/2016  Very truncated exp loop effort dep portion only  - Allergy profile 10/22/2016 >  Eos 0.2/  IgE  52 RAST POS grass/trees/ragweed  10/22/2016  After extensive coaching HFA effectiveness =    75% try duelra 100 2bid > improved  All goals of chronic asthma control met including optimal function and elimination of symptoms with minimal need for rescue therapy.  Contingencies discussed in full including contacting this office immediately if not controlling the symptoms using the rule of two's.     No change in rx needed for now

## 2016-12-10 ENCOUNTER — Emergency Department (HOSPITAL_COMMUNITY): Payer: BLUE CROSS/BLUE SHIELD

## 2016-12-10 ENCOUNTER — Emergency Department (HOSPITAL_COMMUNITY)
Admission: EM | Admit: 2016-12-10 | Discharge: 2016-12-10 | Disposition: A | Discharge status: Home / Self Care | Payer: BLUE CROSS/BLUE SHIELD | Attending: Emergency Medicine | Admitting: Emergency Medicine

## 2016-12-10 ENCOUNTER — Encounter (HOSPITAL_COMMUNITY): Payer: Self-pay | Admitting: Emergency Medicine

## 2016-12-10 DIAGNOSIS — Z87891 Personal history of nicotine dependence: Secondary | ICD-10-CM | POA: Diagnosis not present

## 2016-12-10 DIAGNOSIS — I10 Essential (primary) hypertension: Secondary | ICD-10-CM | POA: Diagnosis not present

## 2016-12-10 DIAGNOSIS — R05 Cough: Secondary | ICD-10-CM | POA: Diagnosis present

## 2016-12-10 DIAGNOSIS — R059 Cough, unspecified: Secondary | ICD-10-CM

## 2016-12-10 DIAGNOSIS — J4521 Mild intermittent asthma with (acute) exacerbation: Secondary | ICD-10-CM | POA: Diagnosis not present

## 2016-12-10 MED ORDER — PREDNISONE 10 MG PO TABS: ORAL_TABLET | ORAL | 0 refills | Status: DC

## 2016-12-10 MED ORDER — IPRATROPIUM-ALBUTEROL 0.5-2.5 (3) MG/3ML IN SOLN
3.0000 mL | Freq: Once | RESPIRATORY_TRACT | Status: AC
  Administered 2016-12-10: 3 mL via RESPIRATORY_TRACT
  Filled 2016-12-10: qty 3

## 2016-12-10 MED ORDER — METHYLPREDNISOLONE SODIUM SUCC 125 MG IJ SOLR
125.0000 mg | Freq: Once | INTRAMUSCULAR | Status: AC
  Administered 2016-12-10: 125 mg via INTRAVENOUS
  Filled 2016-12-10: qty 2

## 2016-12-10 MED ORDER — DIPHENHYDRAMINE HCL 50 MG/ML IJ SOLN
25.0000 mg | Freq: Once | INTRAMUSCULAR | Status: AC
  Administered 2016-12-10: 25 mg via INTRAVENOUS
  Filled 2016-12-10: qty 1

## 2016-12-10 MED ORDER — AEROCHAMBER PLUS FLO-VU MEDIUM MISC
1.0000 | Freq: Once | Status: AC
  Administered 2016-12-10: 1
  Filled 2016-12-10: qty 1

## 2016-12-10 MED ORDER — ALBUTEROL SULFATE HFA 108 (90 BASE) MCG/ACT IN AERS
1.0000 | INHALATION_SPRAY | RESPIRATORY_TRACT | Status: DC | PRN
  Administered 2016-12-10: 2 via RESPIRATORY_TRACT
  Filled 2016-12-10: qty 6.7

## 2016-12-10 MED ORDER — FAMOTIDINE IN NACL 20-0.9 MG/50ML-% IV SOLN
20.0000 mg | Freq: Once | INTRAVENOUS | Status: AC
  Administered 2016-12-10: 20 mg via INTRAVENOUS
  Filled 2016-12-10: qty 50

## 2016-12-10 NOTE — ED Provider Notes (Signed)
[facility] DEPT Provider Note   CSN: [id number] Arrival date & time: 12/10/16  [place]     History   Chief Complaint Chief Complaint  Patient presents with  . Cough  . Oral Swelling    HPI Victoria Moore is a 40 y.o. female.  Pt felt her throat start to swell up about 1 hour ago.  She also has an associated cough with sob.  Her palms became red.  She has a hx of sarcoid and is currently on prednisone.  No new known allergy exposures.      Past Medical History:  Diagnosis Date  . Anemia   . Anxiety   . Common migraine with intractable migraine 10/07/2016  . GERD (gastroesophageal reflux disease)   . Migraine   . Sarcoidosis ([place])   . Seizures ([place])    last one year ago    Patient Active Problem List   Diagnosis Date Noted  . Essential hypertension 11/15/2016  . 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 Morbid (severe) obesity due to excess calories ([place]) 10/23/2016  . Cough variant asthma vs UACS  10/23/2016  . Common migraine with intractable migraine 10/07/2016  . Dyspnea 04/15/2016  . Hilar adenopathy 04/15/2016  . Convulsions/seizures ([place]) 03/11/2016  . Fibroids 03/11/2016  . Anemia 11/12/2014  . Symptomatic anemia 11/12/2014  . Menorrhagia 11/12/2014  . Iron deficiency 11/12/2014  . Right-sided chest pain 11/12/2014    Past Surgical History:  Procedure Laterality Date  . ABDOMINAL HYSTERECTOMY    . LAPAROSCOPIC OVARIAN CYSTECTOMY Left 03/11/2016   Procedure: LAPAROSCOPIC OVARIAN CYSTECTOMY;  Surgeon: Eldred Manges, MD;  Location: [facility];  Service: Gynecology;  Laterality: Left;  . LAPAROSCOPIC VAGINAL HYSTERECTOMY WITH SALPINGECTOMY Bilateral 03/11/2016   Procedure: LAPAROSCOPIC ASSISTED VAGINAL HYSTERECTOMY WITH SALPINGECTOMY;  Surgeon: Eldred Manges, MD;  Location: [facility];  Service: Gynecology;  Laterality: Bilateral;  . TUBAL LIGATION      OB History    Gravida Para Term Preterm AB Living   1             SAB TAB Ectopic Multiple Live  Births                   Home Medications    Prior to Admission medications   Medication Sig Start Date End Date Taking? Authorizing Provider  acetaminophen-codeine (TYLENOL #4) 300-60 MG tablet TAKE 1 TABLET AS NEEDED EVERY 6 HRS ORALLY 10 DAYS, Starting 11/30/16. 11/30/16  Yes Historical Provider, MD  ALPRAZolam Duanne Moron) 1 MG tablet Take 1 mg by mouth daily as needed for anxiety.   Yes Historical Provider, MD  cyclobenzaprine (FLEXERIL) 10 MG tablet Take 10-20 mg by mouth at bedtime as needed for muscle spasms. 12/02/16  Yes Historical Provider, MD  DEXILANT 60 MG capsule Take 60 mg by mouth daily. 11/30/16  Yes Historical Provider, MD  DULERA 200-5 MCG/ACT AERO Inhale 2 puffs into the lungs 2 (two) times daily. 11/06/16  Yes Historical Provider, MD  rizatriptan (MAXALT) 10 MG tablet TAKE 1 TABLET BY MOUTH ONCE A DAY PRN FOR MIGRAINES 10/20/16  Yes Historical Provider, MD  sucralfate (CARAFATE) 1 g tablet Take 1 g by mouth 4 (four) times daily. 11/29/16  Yes Historical Provider, MD  topiramate (TOPAMAX) 25 MG tablet TAKE 1 TABLET BY MOUTH AT NIGHT X1 WEEK THEN 2 TABS AT NIGHT X1 WEEK THEN 3 TABS AT NIGHT 11/02/16  Yes Historical Provider, MD  mometasone-formoterol (DULERA) 100-5 MCG/ACT AERO Inhale 2 puffs into the lungs 2 (  two) times daily. Patient not taking: Reported on 12/10/2016 11/10/16   Tanda Rockers, MD  predniSONE (DELTASONE) 10 MG tablet Take 60 mg on 12/16, 17, 18, and 19.  Then go back to your regular dose 12/10/16   Isla Pence, MD    Family History Family History  Problem Relation Age of Onset  . Adopted: Yes  . Other Son     Growing pains  . Asthma Mother   . Heart Problems Mother   . Migraines Mother   . Diabetes Father   . Peptic Ulcer Father   . Heart Problems Father     Social History Social History  Substance Use Topics  . Smoking status: Former Smoker    Packs/day: 0.25    Years: 17.00    Types: Cigarettes    Quit date: 03/02/2013  . Smokeless tobacco:  Never Used  . Alcohol use No     Comment: only on special occasions     Allergies   Aspirin   Review of Systems Review of Systems  HENT:       Throat feels swollen  Respiratory: Positive for cough and shortness of breath.   All other systems reviewed and are negative.    Physical Exam Updated Vital Signs BP 119/81   Pulse 60   Temp 98.7 F (37.1 C) (Oral)   Resp 20   Ht 5\' 9"  (1.753 m)   Wt 290 lb (131.5 kg)   LMP 02/19/2016   SpO2 100%   BMI 42.83 kg/m   Physical Exam  Constitutional: She is oriented to person, place, and time. She appears well-developed and well-nourished.  HENT:  Head: Normocephalic and atraumatic.  Right Ear: External ear normal.  Left Ear: External ear normal.  Nose: Nose normal.  Mouth/Throat: Oropharynx is clear and moist.  Eyes: Conjunctivae and EOM are normal. Pupils are equal, round, and reactive to light.  Neck: Normal range of motion. Neck supple.  Left cervical lad  Cardiovascular: Normal rate, regular rhythm, normal heart sounds and intact distal pulses.   Pulmonary/Chest: Effort normal and breath sounds normal.  Abdominal: Soft. Bowel sounds are normal.  Musculoskeletal: Normal range of motion.  Neurological: She is alert and oriented to person, place, and time.  Skin: Skin is warm.  Psychiatric: She has a normal mood and affect. Her behavior is normal. Judgment and thought content normal.  Nursing note and vitals reviewed.    ED Treatments / Results  Labs (all labs ordered are listed, but only abnormal results are displayed) Labs Reviewed - No data to display  EKG  EKG Interpretation None       Radiology Dg Chest 2 View  Result Date: 12/10/2016 CLINICAL DATA:  40 y/o F; history of sarcoidosis. Patient reports feeling her throat start to swell 1 hour ago with a feeling of shortness of breath. EXAM: CHEST  2 VIEW COMPARISON:  10/22/2016 chest radiograph FINDINGS: The heart size and mediastinal contours are within  normal limits and stable given projection and technique. Both lungs are clear. The visualized skeletal structures are unremarkable. IMPRESSION: No active cardiopulmonary disease. Electronically Signed   By: Kristine Garbe M.D.   On: 12/10/2016 19:53    Procedures Procedures (including critical care time)  Medications Ordered in ED Medications  albuterol (PROVENTIL HFA;VENTOLIN HFA) 108 (90 Base) MCG/ACT inhaler 1-2 puff (not administered)  AEROCHAMBER PLUS FLO-VU MEDIUM MISC 1 each (not administered)  methylPREDNISolone sodium succinate (SOLU-MEDROL) 125 mg/2 mL injection 125 mg (125 mg Intravenous Given  12/10/16 2131)  famotidine (PEPCID) IVPB 20 mg premix (20 mg Intravenous New Bag/Given 12/10/16 2131)  diphenhydrAMINE (BENADRYL) injection 25 mg (25 mg Intravenous Given 12/10/16 2131)  ipratropium-albuterol (DUONEB) 0.5-2.5 (3) MG/3ML nebulizer solution 3 mL (3 mLs Nebulization Given 12/10/16 2042)     Initial Impression / Assessment and Plan / ED Course  I have reviewed the triage vital signs and the nursing notes.  Pertinent labs & imaging results that were available during my care of the patient were reviewed by me and considered in my medical decision making (see chart for details).  Clinical Course    Pt feeling much better after neb.  Sx likely due to rad.  She is given an albuterol inhaler and spacer prior to d/c.  She knows to return if worse.   Final Clinical Impressions(s) / ED Diagnoses   Final diagnoses:  Mild intermittent reactive airway disease with acute exacerbation  Cough    New Prescriptions Current Discharge Medication List       Isla Pence, MD 12/10/16 2302

## 2016-12-10 NOTE — ED Notes (Signed)
Made two IV attempts.  Second RN will try.

## 2016-12-10 NOTE — ED Notes (Signed)
Pt reports that she feels better after neb tx, reports dec SOB

## 2016-12-10 NOTE — ED Triage Notes (Signed)
Pt has hx of sarcoidosos. Pt reports she felt her throat start to swell up an hour ago causing her to feel SOB. Also reports having a cough at home. Tried home medications without relief.  Pt also reports HA for the past year that is worse today.

## 2016-12-15 ENCOUNTER — Ambulatory Visit: Payer: BLUE CROSS/BLUE SHIELD | Admitting: Neurology

## 2016-12-16 ENCOUNTER — Encounter: Payer: Self-pay | Admitting: Neurology

## 2016-12-17 ENCOUNTER — Ambulatory Visit (INDEPENDENT_AMBULATORY_CARE_PROVIDER_SITE_OTHER): Payer: BLUE CROSS/BLUE SHIELD | Admitting: Physician Assistant

## 2016-12-17 VITALS — BP 132/80 | HR 77 | Temp 98.9°F | Resp 18 | Ht 69.0 in | Wt 284.0 lb

## 2016-12-17 DIAGNOSIS — M542 Cervicalgia: Secondary | ICD-10-CM

## 2016-12-17 NOTE — Progress Notes (Signed)
12/21/2016 8:44 AM   DOB: December 21, 1976 / MRN: [id number]  SUBJECTIVE:  Victoria Moore is a 40 y.o. female with a history of questionable sarcoidosis presenting for HTN.  She has been monitioring her BP since last week. Reports the highest number 179/100 just before going to the ED on the 15th. She was treated for an asthma flare at that time.  She was started on Norvasc 10 mg on the 19th. After starting the Norvasc her BP measured150/97 yesterday and 165/108.   Complains of tightening and swelling that occurred on the right side of her neck that started last night. She tried a breathing treating herself with prednisone, benadryl and a breathing treatment and reports this made the symptoms largely go away after 30 minutes.    She has had 60 mg of pred last night and today and she tells me that she is taking 60 mg of prednisone and has been taking this since the first of this month.   She is allergic to aspirin.   She  has a past medical history of Allergy; Anemia; Anxiety; Arthritis; Asthma; Blood transfusion without reported diagnosis; Chronic kidney disease; Common migraine with intractable migraine (10/07/2016); Depression; GERD (gastroesophageal reflux disease); Hypertension; Migraine; Sarcoidosis ([place]); and Seizures ([place]).    She  reports that she quit smoking about 3 years ago. Her smoking use included Cigarettes. She has a 4.25 pack-year smoking history. She has never used smokeless tobacco. She reports that she does not drink alcohol or use drugs. She  reports that she currently engages in sexual activity. She reports using the following method of birth control/protection: Surgical. The patient  has a past surgical history that includes Tubal ligation; Laparoscopic vaginal hysterectomy with salpingectomy (Bilateral, 03/11/2016); Laparoscopic ovarian cystectomy (Left, 03/11/2016); and Abdominal hysterectomy.  Her family history includes Asthma in her mother; Diabetes in her father; Heart Problems  in her father and mother; Migraines in her mother; Other in her son; Peptic Ulcer in her father. She was adopted.  Review of Systems  Constitutional: Negative for chills and fever.  Respiratory: Negative for cough and wheezing.   Cardiovascular: Negative for chest pain and leg swelling.  Musculoskeletal: Negative for neck pain.  Skin: Negative for rash.  Neurological: Negative for dizziness.    The problem list and medications were reviewed and updated by myself where necessary and exist elsewhere in the encounter.   OBJECTIVE:  BP 132/80 (BP Location: Right Arm, Patient Position: Sitting, Cuff Size: Large)   Pulse 77   Temp 98.9 F (37.2 C) (Oral)   Resp 18   Ht 5\' 9"  (1.753 m)   Wt 284 lb (128.8 kg)   LMP 02/19/2016   SpO2 99%   BMI 41.94 kg/m   Physical Exam  Constitutional: She is oriented to person, place, and time.  HENT:  Right Ear: External ear normal.  Left Ear: External ear normal.  Nose: Mucosal edema present. Right sinus exhibits no maxillary sinus tenderness and no frontal sinus tenderness. Left sinus exhibits no maxillary sinus tenderness and no frontal sinus tenderness.  Mouth/Throat: Oropharynx is clear and moist. No oropharyngeal exudate.  Eyes: Conjunctivae are normal. Pupils are equal, round, and reactive to light.  Cardiovascular: Regular rhythm and normal heart sounds.   Pulmonary/Chest: Effort normal and breath sounds normal. She has no rales.  Musculoskeletal: Normal range of motion.  Neurological: She is alert and oriented to person, place, and time.  Skin: Skin is warm and dry. No rash noted. She is not diaphoretic.  No erythema.  Psychiatric: Her behavior is normal.    Lab Results  Component Value Date   TSH 2.01 10/22/2016   Lab Results  Component Value Date   ESRSEDRATE 41 (H) 10/22/2016   Lab Results  Component Value Date   CALCIUM 8.6 (L) 10/26/2016   Lab Results  Component Value Date   WBC 3.8 (L) 10/26/2016   HGB 11.5 (L)  10/26/2016   HCT 35.0 (L) 10/26/2016   MCV 82.2 10/26/2016   PLT 221 10/26/2016   Lab Results  Component Value Date   ALKPHOS 51 10/26/2016   BP Readings from Last 3 Encounters:  12/17/16 132/80  12/10/16 124/84  11/10/16 (!) 158/94    No results found for this or any previous visit (from the past 72 hour(s)).  No results found.  ASSESSMENT AND PLAN  Georgi was seen today for shortness of breath, hypertension and headache.  Diagnoses and all orders for this visit:  Neck pain: Waxing and waning.  She is taking 60 mg of prednisone daily for a back problem and tells me Dr. Charlestine Night has prescribed this.  I have no access to her medical record for her PCP or rheumatology.  Her exam is normal.  BP normal.  Questionable history of sarcoid.  No concern for allergic reaction by her exam today as well as prednisone admin.  Will get her to ENT so she can discuss her symptoms with a specialty.  -     Ambulatory referral to ENT    The patient is advised to call or return to clinic if she does not see an improvement in symptoms, or to seek the care of the closest emergency department if she worsens with the above plan.   Philis Fendt, MHS, PA-C [facility] 12/21/2016 8:44 AM

## 2016-12-17 NOTE — Patient Instructions (Signed)
     IF you received an x-ray today, you will receive an invoice from [facility]. Please contact [facility] at [phone] with questions or concerns regarding your invoice.   IF you received labwork today, you will receive an invoice from [facility]. Please contact [facility] at [phone] with questions or concerns regarding your invoice.   Our billing staff will not be able to assist you with questions regarding bills from these companies.  You will be contacted with the lab results as soon as they are available. The fastest way to get your results is to activate your My Chart account. Instructions are located on the last page of this paperwork. If you have not heard from us regarding the results in 2 weeks, please contact this office.     

## 2016-12-23 ENCOUNTER — Ambulatory Visit: Payer: BLUE CROSS/BLUE SHIELD | Admitting: Physician Assistant

## 2017-01-12 ENCOUNTER — Ambulatory Visit: Payer: BLUE CROSS/BLUE SHIELD | Admitting: Nurse Practitioner

## 2017-02-09 ENCOUNTER — Ambulatory Visit: Payer: BLUE CROSS/BLUE SHIELD | Admitting: Internal Medicine

## 2017-02-11 ENCOUNTER — Emergency Department (HOSPITAL_COMMUNITY)
Admission: EM | Admit: 2017-02-11 | Discharge: 2017-02-12 | Disposition: A | Discharge status: Home / Self Care | Payer: BLUE CROSS/BLUE SHIELD | Attending: Emergency Medicine | Admitting: Emergency Medicine

## 2017-02-11 ENCOUNTER — Encounter (HOSPITAL_COMMUNITY): Payer: Self-pay

## 2017-02-11 DIAGNOSIS — I129 Hypertensive chronic kidney disease with stage 1 through stage 4 chronic kidney disease, or unspecified chronic kidney disease: Secondary | ICD-10-CM | POA: Diagnosis not present

## 2017-02-11 DIAGNOSIS — N189 Chronic kidney disease, unspecified: Secondary | ICD-10-CM | POA: Diagnosis not present

## 2017-02-11 DIAGNOSIS — J45909 Unspecified asthma, uncomplicated: Secondary | ICD-10-CM | POA: Diagnosis not present

## 2017-02-11 DIAGNOSIS — Z87891 Personal history of nicotine dependence: Secondary | ICD-10-CM | POA: Diagnosis not present

## 2017-02-11 DIAGNOSIS — G40909 Epilepsy, unspecified, not intractable, without status epilepticus: Secondary | ICD-10-CM | POA: Diagnosis present

## 2017-02-11 DIAGNOSIS — Z79899 Other long term (current) drug therapy: Secondary | ICD-10-CM | POA: Insufficient documentation

## 2017-02-11 LAB — COMPREHENSIVE METABOLIC PANEL
ALT: 15 U/L (ref 14–54)
AST: 18 U/L (ref 15–41)
Albumin: 4.4 g/dL (ref 3.5–5.0)
Alkaline Phosphatase: 49 U/L (ref 38–126)
Anion gap: 11 (ref 5–15)
BUN: 11 mg/dL (ref 6–20)
CO2: 25 mmol/L (ref 22–32)
Calcium: 9.4 mg/dL (ref 8.9–10.3)
Chloride: 104 mmol/L (ref 101–111)
Creatinine, Ser: 0.93 mg/dL (ref 0.44–1.00)
GFR calc Af Amer: >60 mL/min (ref >60)
GFR calc non Af Amer: >60 mL/min (ref >60)
Glucose, Bld: 97 mg/dL (ref 65–99)
Potassium: 3.5 mmol/L (ref 3.5–5.1)
Sodium: 140 mmol/L (ref 135–145)
Total Bilirubin: 0.6 mg/dL (ref 0.3–1.2)
Total Protein: 8.3 g/dL — ABNORMAL HIGH (ref 6.5–8.1)

## 2017-02-11 LAB — CBC WITH DIFFERENTIAL/PLATELET
Basophils Absolute: 0 10*3/uL (ref 0.0–0.1)
Basophils Relative: 0 %
Eosinophils Absolute: 0.1 10*3/uL (ref 0.0–0.7)
Eosinophils Relative: 2 %
HCT: 36.8 % (ref 36.0–46.0)
Hemoglobin: 12 g/dL (ref 12.0–15.0)
Lymphocytes Relative: 20 %
Lymphs Abs: 1.4 10*3/uL (ref 0.7–4.0)
MCH: 27 pg (ref 26.0–34.0)
MCHC: 32.6 g/dL (ref 30.0–36.0)
MCV: 82.9 fL (ref 78.0–100.0)
Monocytes Absolute: 0.4 10*3/uL (ref 0.1–1.0)
Monocytes Relative: 6 %
Neutro Abs: 4.9 10*3/uL (ref 1.7–7.7)
Neutrophils Relative %: 72 %
Platelets: 226 10*3/uL (ref 150–400)
RBC: 4.44 MIL/uL (ref 3.87–5.11)
RDW: 13.6 % (ref 11.5–15.5)
WBC: 6.8 10*3/uL (ref 4.0–10.5)

## 2017-02-11 LAB — RAPID URINE DRUG SCREEN, HOSP PERFORMED
Amphetamines: NOT DETECTED
Barbiturates: NOT DETECTED
Benzodiazepines: POSITIVE — AB
Cocaine: NOT DETECTED
Opiates: NOT DETECTED
Tetrahydrocannabinol: NOT DETECTED

## 2017-02-11 LAB — PREGNANCY, URINE: Preg Test, Ur: NEGATIVE

## 2017-02-11 LAB — ETHANOL: Alcohol, Ethyl (B): <5 mg/dL (ref <5)

## 2017-02-11 MED ORDER — METOCLOPRAMIDE HCL 5 MG/ML IJ SOLN
10.0000 mg | Freq: Once | INTRAMUSCULAR | Status: AC
  Administered 2017-02-11: 10 mg via INTRAVENOUS
  Filled 2017-02-11: qty 2

## 2017-02-11 MED ORDER — LORAZEPAM 2 MG/ML IJ SOLN
1.0000 mg | Freq: Once | INTRAMUSCULAR | Status: AC
  Administered 2017-02-11: 1 mg via INTRAVENOUS
  Filled 2017-02-11: qty 1

## 2017-02-11 MED ORDER — SODIUM CHLORIDE 0.9 % IV SOLN
1800.0000 mg | INTRAVENOUS | Status: AC
  Administered 2017-02-11: 1800 mg via INTRAVENOUS
  Filled 2017-02-11: qty 36

## 2017-02-11 NOTE — ED Notes (Signed)
Attempt US guided IV x1 unsuccessful.

## 2017-02-11 NOTE — ED Triage Notes (Addendum)
Per EMS Pt c/o migraine today. Hx of same. Photophobic, nauseous. A&Ox4. Ambulatory.

## 2017-02-11 NOTE — Progress Notes (Signed)
MEDICATION RELATED CONSULT NOTE - INITIAL   Pharmacy Consult for Phenytoin Indication: seizures  Allergies  Allergen Reactions  . Aspirin Anaphylaxis and Hives    Patient has received Ketorolac 3 times prior to surgical date 03/11/16 without sequale.      Patient Measurements: Height: 5\' 8"  (172.7 cm) Weight: 287 lb (130.2 kg) IBW/kg (Calculated) : 63.9  Vital Signs: Temp: 98.2 F (36.8 C) (02/16 1758) Temp Source: Oral (02/16 1758) BP: 148/95 (02/16 2030) Pulse Rate: 58 (02/16 1837) Intake/Output from previous day: No intake/output data recorded. Intake/Output from this shift: No intake/output data recorded.  Labs:  Recent Labs  02/11/17 2027  WBC 6.8  HGB 12.0  HCT 36.8  PLT 226  CREATININE 0.93  ALBUMIN 4.4  PROT 8.3*  AST 18  ALT 15  ALKPHOS 49  BILITOT 0.6   Estimated Creatinine Clearance: 112.5 mL/min (by C-G formula based on SCr of 0.93 mg/dL).   Microbiology: No results found for this or any previous visit (from the past 720 hour(s)).  Medical History: Past Medical History:  Diagnosis Date  . Allergy   . Anemia   . Anxiety   . Arthritis   . Asthma   . Blood transfusion without reported diagnosis   . Chronic kidney disease   . Common migraine with intractable migraine 10/07/2016  . Depression   . GERD (gastroesophageal reflux disease)   . Hypertension   . Migraine   . Sarcoidosis ([place])   . Seizures ([place])    last one year ago    Assessment: 65 y/oF who presents to [facility] ED on 02/11/17 after having a seizure at work. Patient does have a PMH of seizures, but reports being taking off anti-epileptic medications a year ago secondary to no recent seizure activity. Pharmacy consulted to assist with dosing of IV Phenytoin in the ED. SCr, LFTs WNL.  Plan:  -Phenytoin 1800 mg IV x 1 in the ED (~ 20 mg/kg based on adjusted body weight).    Lindell Spar, PharmD, BCPS Pager: [phone] 02/11/2017 10:09 PM

## 2017-02-11 NOTE — ED Notes (Signed)
Pt verbalizes hx of seizures taken off medication a year ago related to no recent seizures. Pt continues "I know I had a seizure at work; I also have migraines."

## 2017-02-11 NOTE — ED Notes (Signed)
Pt states that she had a seizure today. She states that she has a hx of epilepsy, but couldn't say that at her job.

## 2017-02-11 NOTE — ED Notes (Signed)
Pt being cardiac monitored.

## 2017-02-12 MED ORDER — PHENYTOIN SODIUM EXTENDED 100 MG PO CAPS: 300.0000 mg | ORAL_CAPSULE | Freq: Every day | ORAL | 1 refills | Status: DC

## 2017-02-12 NOTE — Discharge Instructions (Signed)
Start the seizure medicine tomorrow. See your primary care doctor, or the neurologist for further care of the seizure. Return to the ER if the seizure recur.

## 2017-02-12 NOTE — ED Provider Notes (Signed)
[facility] DEPT Provider Note   CSN: [id number] Arrival date & time: 02/11/17  1746     History   Chief Complaint Chief Complaint  Patient presents with  . Seizures    HPI Victoria Moore is a 41 y.o. female.  HPI Pt comes in after having a seizure. She has hx of eplepsy but was stopped on her 300 mg dilantin qhs last year by her PCP. She hasnt had a siezure since last April. Pt reports that it was a normal day for her today until she had her seizure. Her seizure occurred while at work, and she typically has LOC. Pt has no infection like symptoms and denies any new stressors or drug use.   Past Medical History:  Diagnosis Date  . Allergy   . Anemia   . Anxiety   . Arthritis   . Asthma   . Blood transfusion without reported diagnosis   . Chronic kidney disease   . Common migraine with intractable migraine 10/07/2016  . Depression   . GERD (gastroesophageal reflux disease)   . Hypertension   . Migraine   . Sarcoidosis ([place])   . Seizures ([place])    last one year ago    Patient Active Problem List   Diagnosis Date Noted  . Essential hypertension 11/15/2016  . 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 Morbid (severe) obesity due to excess calories ([place]) 10/23/2016  . Cough variant asthma vs UACS  10/23/2016  . Common migraine with intractable migraine 10/07/2016  . Dyspnea 04/15/2016  . Hilar adenopathy 04/15/2016  . Convulsions/seizures ([place]) 03/11/2016  . Fibroids 03/11/2016  . Anemia 11/12/2014  . Symptomatic anemia 11/12/2014  . Menorrhagia 11/12/2014  . Iron deficiency 11/12/2014  . Right-sided chest pain 11/12/2014    Past Surgical History:  Procedure Laterality Date  . ABDOMINAL HYSTERECTOMY    . LAPAROSCOPIC OVARIAN CYSTECTOMY Left 03/11/2016   Procedure: LAPAROSCOPIC OVARIAN CYSTECTOMY;  Surgeon: Eldred Manges, MD;  Location: [facility];  Service: Gynecology;  Laterality: Left;  . LAPAROSCOPIC VAGINAL HYSTERECTOMY WITH SALPINGECTOMY Bilateral  03/11/2016   Procedure: LAPAROSCOPIC ASSISTED VAGINAL HYSTERECTOMY WITH SALPINGECTOMY;  Surgeon: Eldred Manges, MD;  Location: [facility];  Service: Gynecology;  Laterality: Bilateral;  . TUBAL LIGATION      OB History    Gravida Para Term Preterm AB Living   1             SAB TAB Ectopic Multiple Live Births                   Home Medications    Prior to Admission medications   Medication Sig Start Date End Date Taking? Authorizing Provider  acetaminophen-codeine (TYLENOL #4) 300-60 MG tablet TAKE 1 TABLET AS NEEDED EVERY 6 HRS ORALLY 10 DAYS, Starting 11/30/16. 11/30/16   Historical Provider, MD  ALPRAZolam Duanne Moron) 1 MG tablet Take 1 mg by mouth daily as needed for anxiety.    Historical Provider, MD  amLODipine (NORVASC) 10 MG tablet Take 10 mg by mouth daily.    Historical Provider, MD  cyclobenzaprine (FLEXERIL) 10 MG tablet Take 10-20 mg by mouth at bedtime as needed for muscle spasms. 12/02/16   Historical Provider, MD  DEXILANT 60 MG capsule Take 60 mg by mouth daily. 11/30/16   Historical Provider, MD  DULERA 200-5 MCG/ACT AERO Inhale 2 puffs into the lungs 2 (two) times daily. 11/06/16   Historical Provider, MD  mometasone-formoterol (DULERA) 100-5 MCG/ACT AERO Inhale 2 puffs into the lungs 2 (  two) times daily. 11/10/16   Tanda Rockers, MD  phenytoin (DILANTIN) 100 MG ER capsule Take 3 capsules (300 mg total) by mouth at bedtime. 02/12/17   Varney Biles, MD  predniSONE (DELTASONE) 10 MG tablet Take 60 mg on 12/16, 17, 18, and 19.  Then go back to your regular dose 12/10/16   Isla Pence, MD  rizatriptan (MAXALT) 10 MG tablet TAKE 1 TABLET BY MOUTH ONCE A DAY PRN FOR MIGRAINES 10/20/16   Historical Provider, MD  sucralfate (CARAFATE) 1 g tablet Take 1 g by mouth 4 (four) times daily. 11/29/16   Historical Provider, MD    Family History Family History  Problem Relation Age of Onset  . Adopted: Yes  . Other Son     Growing pains  . Asthma Mother   . Heart Problems Mother     . Migraines Mother   . Diabetes Father   . Peptic Ulcer Father   . Heart Problems Father     Social History Social History  Substance Use Topics  . Smoking status: Former Smoker    Packs/day: 0.25    Years: 17.00    Types: Cigarettes    Quit date: 03/02/2013  . Smokeless tobacco: Never Used  . Alcohol use No     Comment: only on special occasions     Allergies   Aspirin   Review of Systems Review of Systems  Constitutional: Negative for activity change.  Respiratory: Negative for shortness of breath.   Cardiovascular: Negative for chest pain.  Gastrointestinal: Negative for abdominal pain, nausea and vomiting.  Genitourinary: Negative for dysuria.  Musculoskeletal: Negative for neck pain.  Neurological: Positive for seizures. Negative for light-headedness, numbness and headaches.     Physical Exam Updated Vital Signs BP 140/87   Pulse (!) 58   Temp 97.2 F (36.2 C) (Oral)   Resp 16   Ht 5\' 8"  (1.727 m)   Wt 287 lb (130.2 kg)   LMP 02/19/2016   SpO2 100%   BMI 43.64 kg/m   Physical Exam  Constitutional: She is oriented to person, place, and time. She appears well-developed and well-nourished.  HENT:  Head: Normocephalic and atraumatic.  Eyes: EOM are normal. Pupils are equal, round, and reactive to light.  Neck: Neck supple.  Cardiovascular: Normal rate, regular rhythm and normal heart sounds.   No murmur heard. Pulmonary/Chest: Effort normal. No respiratory distress.  Abdominal: Soft. She exhibits no distension. There is no tenderness. There is no rebound and no guarding.  Neurological: She is alert and oriented to person, place, and time. No cranial nerve deficit. Coordination normal.  Skin: Skin is warm and dry.  Nursing note and vitals reviewed.    ED Treatments / Results  Labs (all labs ordered are listed, but only abnormal results are displayed) Labs Reviewed  COMPREHENSIVE METABOLIC PANEL - Abnormal; Notable for the following:       Result  Value   Total Protein 8.3 (*)    All other components within normal limits  RAPID URINE DRUG SCREEN, HOSP PERFORMED - Abnormal; Notable for the following:    Benzodiazepines POSITIVE (*)    All other components within normal limits  CBC WITH DIFFERENTIAL/PLATELET  ETHANOL  PREGNANCY, URINE    EKG  EKG Interpretation None       Radiology No results found.  Procedures Procedures (including critical care time)  Medications Ordered in ED Medications  metoCLOPramide (REGLAN) injection 10 mg (10 mg Intravenous Given 02/11/17 2121)  LORazepam (ATIVAN)  injection 1 mg (1 mg Intravenous Given 02/11/17 2120)  phenytoin (DILANTIN) 1,800 mg in sodium chloride 0.9 % 250 mL IVPB (0 mg Intravenous Stopped 02/11/17 2339)     Initial Impression / Assessment and Plan / ED Course  I have reviewed the triage vital signs and the nursing notes.  Pertinent labs & imaging results that were available during my care of the patient were reviewed by me and considered in my medical decision making (see chart for details).     Pt comes in after having a seizure. She has hx of epilepsy. Her current episode doesn't seem to have been triggered by drugs or infection. We will load her back on dilantin and have her see her pcp.   Final Clinical Impressions(s) / ED Diagnoses   Final diagnoses:  Seizure disorder (HCC)    New Prescriptions New Prescriptions   PHENYTOIN (DILANTIN) 100 MG ER CAPSULE    Take 3 capsules (300 mg total) by mouth at bedtime.     Varney Biles, MD 02/12/17 0111

## 2017-02-13 ENCOUNTER — Encounter (HOSPITAL_COMMUNITY): Payer: Self-pay

## 2017-02-13 ENCOUNTER — Other Ambulatory Visit: Payer: Self-pay

## 2017-02-13 ENCOUNTER — Other Ambulatory Visit: Payer: Self-pay | Admitting: Internal Medicine

## 2017-02-13 ENCOUNTER — Emergency Department (HOSPITAL_COMMUNITY): Payer: BLUE CROSS/BLUE SHIELD

## 2017-02-13 ENCOUNTER — Emergency Department (HOSPITAL_COMMUNITY)
Admission: EM | Admit: 2017-02-13 | Discharge: 2017-02-13 | Disposition: A | Discharge status: Home / Self Care | Payer: BLUE CROSS/BLUE SHIELD | Attending: Emergency Medicine | Admitting: Emergency Medicine

## 2017-02-13 DIAGNOSIS — Z87891 Personal history of nicotine dependence: Secondary | ICD-10-CM | POA: Diagnosis not present

## 2017-02-13 DIAGNOSIS — J45909 Unspecified asthma, uncomplicated: Secondary | ICD-10-CM | POA: Insufficient documentation

## 2017-02-13 DIAGNOSIS — G4489 Other headache syndrome: Secondary | ICD-10-CM

## 2017-02-13 DIAGNOSIS — Z79899 Other long term (current) drug therapy: Secondary | ICD-10-CM | POA: Insufficient documentation

## 2017-02-13 DIAGNOSIS — N189 Chronic kidney disease, unspecified: Secondary | ICD-10-CM | POA: Insufficient documentation

## 2017-02-13 DIAGNOSIS — R079 Chest pain, unspecified: Secondary | ICD-10-CM

## 2017-02-13 DIAGNOSIS — I129 Hypertensive chronic kidney disease with stage 1 through stage 4 chronic kidney disease, or unspecified chronic kidney disease: Secondary | ICD-10-CM | POA: Insufficient documentation

## 2017-02-13 DIAGNOSIS — R0789 Other chest pain: Secondary | ICD-10-CM | POA: Insufficient documentation

## 2017-02-13 DIAGNOSIS — R51 Headache: Secondary | ICD-10-CM | POA: Diagnosis present

## 2017-02-13 LAB — PHENYTOIN LEVEL, TOTAL: Phenytoin Lvl: 11.7 ug/mL (ref 10.0–20.0)

## 2017-02-13 LAB — BASIC METABOLIC PANEL
Anion gap: 7 (ref 5–15)
BUN: 10 mg/dL (ref 6–20)
CO2: 28 mmol/L (ref 22–32)
Calcium: 8.9 mg/dL (ref 8.9–10.3)
Chloride: 106 mmol/L (ref 101–111)
Creatinine, Ser: 1 mg/dL (ref 0.44–1.00)
GFR calc Af Amer: >60 mL/min (ref >60)
GFR calc non Af Amer: >60 mL/min (ref >60)
Glucose, Bld: 89 mg/dL (ref 65–99)
Potassium: 3.6 mmol/L (ref 3.5–5.1)
Sodium: 141 mmol/L (ref 135–145)

## 2017-02-13 LAB — CBC WITH DIFFERENTIAL/PLATELET
Basophils Absolute: 0 10*3/uL (ref 0.0–0.1)
Basophils Relative: 0 %
Eosinophils Absolute: 0.2 10*3/uL (ref 0.0–0.7)
Eosinophils Relative: 3 %
HCT: 40 % (ref 36.0–46.0)
Hemoglobin: 12.7 g/dL (ref 12.0–15.0)
Lymphocytes Relative: 26 %
Lymphs Abs: 1.5 10*3/uL (ref 0.7–4.0)
MCH: 26.8 pg (ref 26.0–34.0)
MCHC: 31.8 g/dL (ref 30.0–36.0)
MCV: 84.6 fL (ref 78.0–100.0)
Monocytes Absolute: 0.4 10*3/uL (ref 0.1–1.0)
Monocytes Relative: 6 %
Neutro Abs: 3.7 10*3/uL (ref 1.7–7.7)
Neutrophils Relative %: 65 %
Platelets: 224 10*3/uL (ref 150–400)
RBC: 4.73 MIL/uL (ref 3.87–5.11)
RDW: 13.7 % (ref 11.5–15.5)
WBC: 5.7 10*3/uL (ref 4.0–10.5)

## 2017-02-13 LAB — TROPONIN I: Troponin I: <0.03 ng/mL (ref <0.03)

## 2017-02-13 LAB — CBG MONITORING, ED: Glucose-Capillary: 88 mg/dL (ref 65–99)

## 2017-02-13 MED ORDER — OXYCODONE-ACETAMINOPHEN 5-325 MG PO TABS: 2.0000 | ORAL_TABLET | ORAL | 0 refills | Status: DC | PRN

## 2017-02-13 MED ORDER — DIPHENHYDRAMINE HCL 50 MG/ML IJ SOLN
12.5000 mg | Freq: Once | INTRAMUSCULAR | Status: AC
  Administered 2017-02-13: 12.5 mg via INTRAVENOUS
  Filled 2017-02-13: qty 1

## 2017-02-13 MED ORDER — MORPHINE SULFATE (PF) 4 MG/ML IV SOLN
4.0000 mg | Freq: Once | INTRAVENOUS | Status: AC
  Administered 2017-02-13: 4 mg via INTRAVENOUS
  Filled 2017-02-13: qty 1

## 2017-02-13 MED ORDER — LORAZEPAM 2 MG/ML IJ SOLN
1.0000 mg | Freq: Once | INTRAMUSCULAR | Status: AC
  Administered 2017-02-13: 1 mg via INTRAVENOUS
  Filled 2017-02-13: qty 1

## 2017-02-13 MED ORDER — METOCLOPRAMIDE HCL 5 MG/ML IJ SOLN
10.0000 mg | Freq: Once | INTRAMUSCULAR | Status: AC
  Administered 2017-02-13: 10 mg via INTRAVENOUS
  Filled 2017-02-13: qty 2

## 2017-02-13 MED ORDER — SODIUM CHLORIDE 0.9 % IV SOLN
INTRAVENOUS | Status: DC
  Administered 2017-02-13: 22:00:00 via INTRAVENOUS

## 2017-02-13 MED ORDER — SODIUM CHLORIDE 0.9 % IV BOLUS (SEPSIS)
1000.0000 mL | Freq: Once | INTRAVENOUS | Status: AC
  Administered 2017-02-13: 1000 mL via INTRAVENOUS

## 2017-02-13 NOTE — ED Triage Notes (Signed)
Patient reports that she was seen I the ED yesterrday for a seizure and normally she feels tired afterwards, but today she is still having body aches, feels tired, and thirsty

## 2017-02-13 NOTE — ED Notes (Signed)
Per nurse she is going to start an IV and collect labs 

## 2017-02-13 NOTE — ED Provider Notes (Signed)
[facility] DEPT Provider Note   CSN: [id number] Arrival date & time: 02/13/17  1649     History   Chief Complaint Chief Complaint  Patient presents with  . Fatigue  . Polydipsia  . Headache    HPI Victoria Moore is a 41 y.o. female.  41 year old female who presents with left frontal headache as well as diffuse weakness. Seen here 2 days ago after having a seizure. Was loaded with Dilantin which was 1.8 g. Patient denies any vomiting does have some nausea. Does have history of migraines and thinks that this might be similar. Has had some poly-did see but denies any polyuria. Just feels weak all over. No fever or chills. Has had reproducible chest pain without cough or congestion. No leg pain or swelling. Symptoms have been progressively worse and no treatment use prior to arrival.      Past Medical History:  Diagnosis Date  . Allergy   . Anemia   . Anxiety   . Arthritis   . Asthma   . Blood transfusion without reported diagnosis   . Chronic kidney disease   . Common migraine with intractable migraine 10/07/2016  . Depression   . GERD (gastroesophageal reflux disease)   . Hypertension   . Migraine   . Sarcoidosis ([place])   . Seizures ([place])    last one year ago    Patient Active Problem List   Diagnosis Date Noted  . Essential hypertension 11/15/2016  . 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 Morbid (severe) obesity due to excess calories ([place]) 10/23/2016  . Cough variant asthma vs UACS  10/23/2016  . Common migraine with intractable migraine 10/07/2016  . Dyspnea 04/15/2016  . Hilar adenopathy 04/15/2016  . Convulsions/seizures ([place]) 03/11/2016  . Fibroids 03/11/2016  . Anemia 11/12/2014  . Symptomatic anemia 11/12/2014  . Menorrhagia 11/12/2014  . Iron deficiency 11/12/2014  . Right-sided chest pain 11/12/2014    Past Surgical History:  Procedure Laterality Date  . ABDOMINAL HYSTERECTOMY    . LAPAROSCOPIC OVARIAN CYSTECTOMY Left 03/11/2016   Procedure: LAPAROSCOPIC OVARIAN CYSTECTOMY;  Surgeon: Eldred Manges, MD;  Location: [facility];  Service: Gynecology;  Laterality: Left;  . LAPAROSCOPIC VAGINAL HYSTERECTOMY WITH SALPINGECTOMY Bilateral 03/11/2016   Procedure: LAPAROSCOPIC ASSISTED VAGINAL HYSTERECTOMY WITH SALPINGECTOMY;  Surgeon: Eldred Manges, MD;  Location: [facility];  Service: Gynecology;  Laterality: Bilateral;  . TUBAL LIGATION      OB History    Gravida Para Term Preterm AB Living   1             SAB TAB Ectopic Multiple Live Births                   Home Medications    Prior to Admission medications   Medication Sig Start Date End Date Taking? Authorizing Provider  acetaminophen-codeine (TYLENOL #4) 300-60 MG tablet TAKE 1 TABLET AS NEEDED EVERY 6 HRS ORALLY 10 DAYS, Starting 11/30/16. 11/30/16   Historical Provider, MD  ALPRAZolam Duanne Moron) 1 MG tablet Take 1 mg by mouth daily as needed for anxiety.    Historical Provider, MD  amLODipine (NORVASC) 10 MG tablet Take 10 mg by mouth daily.    Historical Provider, MD  cyclobenzaprine (FLEXERIL) 10 MG tablet Take 10-20 mg by mouth at bedtime as needed for muscle spasms. 12/02/16   Historical Provider, MD  DEXILANT 60 MG capsule Take 60 mg by mouth daily. 11/30/16   Historical Provider, MD  DULERA 200-5 MCG/ACT AERO Inhale 2 puffs  into the lungs 2 (two) times daily. 11/06/16   Historical Provider, MD  mometasone-formoterol (DULERA) 100-5 MCG/ACT AERO Inhale 2 puffs into the lungs 2 (two) times daily. 11/10/16   Tanda Rockers, MD  phenytoin (DILANTIN) 100 MG ER capsule Take 3 capsules (300 mg total) by mouth at bedtime. 02/12/17   Varney Biles, MD  predniSONE (DELTASONE) 10 MG tablet Take 60 mg on 12/16, 17, 18, and 19.  Then go back to your regular dose 12/10/16   Isla Pence, MD  rizatriptan (MAXALT) 10 MG tablet TAKE 1 TABLET BY MOUTH ONCE A DAY PRN FOR MIGRAINES 10/20/16   Historical Provider, MD  sucralfate (CARAFATE) 1 g tablet Take 1 g by mouth 4 (four) times  daily. 11/29/16   Historical Provider, MD    Family History Family History  Problem Relation Age of Onset  . Adopted: Yes  . Other Son     Growing pains  . Asthma Mother   . Heart Problems Mother   . Migraines Mother   . Diabetes Father   . Peptic Ulcer Father   . Heart Problems Father     Social History Social History  Substance Use Topics  . Smoking status: Former Smoker    Packs/day: 0.25    Years: 17.00    Types: Cigarettes    Quit date: 03/02/2013  . Smokeless tobacco: Never Used  . Alcohol use No     Comment: only on special occasions     Allergies   Aspirin   Review of Systems Review of Systems  All other systems reviewed and are negative.    Physical Exam Updated Vital Signs BP 132/94 (BP Location: Right Arm)   Pulse 82   Temp 99.5 F (37.5 C) (Oral)   Resp 18   Ht 5\' 8"  (1.727 m)   Wt 130.2 kg   LMP 02/19/2016   SpO2 100%   BMI 43.64 kg/m   Physical Exam  Constitutional: She is oriented to person, place, and time. She appears well-developed and well-nourished.  Non-toxic appearance. No distress.  HENT:  Head: Normocephalic and atraumatic.  Eyes: Conjunctivae, EOM and lids are normal. Pupils are equal, round, and reactive to light.  Neck: Normal range of motion. Neck supple. No tracheal deviation present. No thyroid mass present.  Cardiovascular: Normal rate, regular rhythm and normal heart sounds.  Exam reveals no gallop.   No murmur heard. Pulmonary/Chest: Effort normal and breath sounds normal. No stridor. No respiratory distress. She has no decreased breath sounds. She has no wheezes. She has no rhonchi. She has no rales. She exhibits tenderness.    Abdominal: Soft. Normal appearance and bowel sounds are normal. She exhibits no distension. There is no tenderness. There is no rebound and no CVA tenderness.  Musculoskeletal: Normal range of motion. She exhibits no edema or tenderness.  Neurological: She is alert and oriented to person, place,  and time. She has normal strength. No cranial nerve deficit or sensory deficit. GCS eye subscore is 4. GCS verbal subscore is 5. GCS motor subscore is 6.  Skin: Skin is warm and dry. No abrasion and no rash noted.  Psychiatric: She has a normal mood and affect. Her speech is normal and behavior is normal.  Nursing note and vitals reviewed.    ED Treatments / Results  Labs (all labs ordered are listed, but only abnormal results are displayed) Labs Reviewed  CBC WITH DIFFERENTIAL/PLATELET  BASIC METABOLIC PANEL  PHENYTOIN LEVEL, TOTAL  TROPONIN I  CBG  MONITORING, ED    EKG  EKG Interpretation None       Radiology No results found.  Procedures Procedures (including critical care time)  Medications Ordered in ED Medications  sodium chloride 0.9 % bolus 1,000 mL (not administered)  0.9 %  sodium chloride infusion (not administered)  metoCLOPramide (REGLAN) injection 10 mg (not administered)  diphenhydrAMINE (BENADRYL) injection 12.5 mg (not administered)  morphine 4 MG/ML injection 4 mg (not administered)     Initial Impression / Assessment and Plan / ED Course  I have reviewed the triage vital signs and the nursing notes.  Pertinent labs & imaging results that were available during my care of the patient were reviewed by me and considered in my medical decision making (see chart for details).     Patient says discomfort is reproducible and does not appear to represent ACS. Head CT was within normal limits. Dilantin is not elevated. Her symptoms were treated with meds and she feels somewhat better. sHe is stable for discharge to follow-up with her doctor.  Final Clinical Impressions(s) / ED Diagnoses   Final diagnoses:  Chest pain    New Prescriptions New Prescriptions   No medications on file     Lacretia Leigh, MD 02/13/17 2230

## 2017-02-13 NOTE — ED Notes (Signed)
Unable to collect labs patient is not in the room 

## 2017-02-13 NOTE — ED Notes (Signed)
PT DISCHARGED. INSTRUCTIONS AND PRESCRIPTION GIVEN. AAOX4. PT IN NO APPARENT DISTRESS. THE OPPORTUNITY TO ASK QUESTIONS WAS PROVIDED. 

## 2017-02-14 ENCOUNTER — Emergency Department (HOSPITAL_COMMUNITY): Payer: BLUE CROSS/BLUE SHIELD

## 2017-02-14 ENCOUNTER — Emergency Department (HOSPITAL_COMMUNITY)
Admission: EM | Admit: 2017-02-14 | Discharge: 2017-02-15 | Disposition: A | Discharge status: Home / Self Care | Payer: BLUE CROSS/BLUE SHIELD | Attending: Emergency Medicine | Admitting: Emergency Medicine

## 2017-02-14 ENCOUNTER — Encounter (HOSPITAL_COMMUNITY): Payer: Self-pay

## 2017-02-14 DIAGNOSIS — R770 Abnormality of albumin: Secondary | ICD-10-CM | POA: Insufficient documentation

## 2017-02-14 DIAGNOSIS — R0789 Other chest pain: Secondary | ICD-10-CM | POA: Diagnosis not present

## 2017-02-14 DIAGNOSIS — S060X1D Concussion with loss of consciousness of 30 minutes or less, subsequent encounter: Secondary | ICD-10-CM | POA: Diagnosis not present

## 2017-02-14 DIAGNOSIS — E876 Hypokalemia: Secondary | ICD-10-CM | POA: Diagnosis not present

## 2017-02-14 DIAGNOSIS — S0990XD Unspecified injury of head, subsequent encounter: Secondary | ICD-10-CM | POA: Diagnosis present

## 2017-02-14 DIAGNOSIS — I10 Essential (primary) hypertension: Secondary | ICD-10-CM | POA: Insufficient documentation

## 2017-02-14 DIAGNOSIS — G40909 Epilepsy, unspecified, not intractable, without status epilepticus: Secondary | ICD-10-CM | POA: Insufficient documentation

## 2017-02-14 DIAGNOSIS — J45909 Unspecified asthma, uncomplicated: Secondary | ICD-10-CM | POA: Insufficient documentation

## 2017-02-14 DIAGNOSIS — G44309 Post-traumatic headache, unspecified, not intractable: Secondary | ICD-10-CM | POA: Insufficient documentation

## 2017-02-14 DIAGNOSIS — Z87891 Personal history of nicotine dependence: Secondary | ICD-10-CM | POA: Diagnosis not present

## 2017-02-14 DIAGNOSIS — X58XXXD Exposure to other specified factors, subsequent encounter: Secondary | ICD-10-CM | POA: Diagnosis not present

## 2017-02-14 DIAGNOSIS — E8809 Other disorders of plasma-protein metabolism, not elsewhere classified: Secondary | ICD-10-CM

## 2017-02-14 DIAGNOSIS — Z79899 Other long term (current) drug therapy: Secondary | ICD-10-CM | POA: Insufficient documentation

## 2017-02-14 DIAGNOSIS — R569 Unspecified convulsions: Secondary | ICD-10-CM

## 2017-02-14 DIAGNOSIS — F0781 Postconcussional syndrome: Secondary | ICD-10-CM

## 2017-02-14 LAB — CBC
HCT: 37.1 % (ref 36.0–46.0)
Hemoglobin: 11.7 g/dL — ABNORMAL LOW (ref 12.0–15.0)
MCH: 27.1 pg (ref 26.0–34.0)
MCHC: 31.5 g/dL (ref 30.0–36.0)
MCV: 86.1 fL (ref 78.0–100.0)
Platelets: 134 10*3/uL — ABNORMAL LOW (ref 150–400)
RBC: 4.31 MIL/uL (ref 3.87–5.11)
RDW: 13.7 % (ref 11.5–15.5)
WBC: 3.2 10*3/uL — ABNORMAL LOW (ref 4.0–10.5)

## 2017-02-14 LAB — BASIC METABOLIC PANEL
Anion gap: 3 — ABNORMAL LOW (ref 5–15)
BUN: 6 mg/dL (ref 6–20)
CO2: 21 mmol/L — ABNORMAL LOW (ref 22–32)
Calcium: 6.3 mg/dL — CL (ref 8.9–10.3)
Chloride: 116 mmol/L — ABNORMAL HIGH (ref 101–111)
Creatinine, Ser: 0.78 mg/dL (ref 0.44–1.00)
GFR calc Af Amer: >60 mL/min (ref >60)
GFR calc non Af Amer: >60 mL/min (ref >60)
Glucose, Bld: 86 mg/dL (ref 65–99)
Potassium: 3.3 mmol/L — ABNORMAL LOW (ref 3.5–5.1)
Sodium: 140 mmol/L (ref 135–145)

## 2017-02-14 LAB — HEPATIC FUNCTION PANEL
ALT: 8 U/L — ABNORMAL LOW (ref 14–54)
AST: 14 U/L — ABNORMAL LOW (ref 15–41)
Albumin: 2.6 g/dL — ABNORMAL LOW (ref 3.5–5.0)
Alkaline Phosphatase: 35 U/L — ABNORMAL LOW (ref 38–126)
Bilirubin, Direct: 0.2 mg/dL (ref 0.1–0.5)
Indirect Bilirubin: 0.3 mg/dL (ref 0.3–0.9)
Total Bilirubin: 0.5 mg/dL (ref 0.3–1.2)
Total Protein: 5.1 g/dL — ABNORMAL LOW (ref 6.5–8.1)

## 2017-02-14 LAB — I-STAT TROPONIN, ED: Troponin i, poc: 0 ng/mL (ref 0.00–0.08)

## 2017-02-14 LAB — MAGNESIUM: Magnesium: 1.5 mg/dL — ABNORMAL LOW (ref 1.7–2.4)

## 2017-02-14 MED ORDER — MAGNESIUM SULFATE IN D5W 1-5 GM/100ML-% IV SOLN
1.0000 g | Freq: Once | INTRAVENOUS | Status: AC
  Administered 2017-02-14: 1 g via INTRAVENOUS
  Filled 2017-02-14: qty 100

## 2017-02-14 MED ORDER — FENTANYL CITRATE (PF) 100 MCG/2ML IJ SOLN
50.0000 ug | Freq: Once | INTRAMUSCULAR | Status: AC
  Administered 2017-02-14: 50 ug via INTRAMUSCULAR
  Filled 2017-02-14: qty 2

## 2017-02-14 MED ORDER — MOMETASONE FURO-FORMOTEROL FUM 100-5 MCG/ACT IN AERO: 2.0000 | INHALATION_SPRAY | Freq: Two times a day (BID) | RESPIRATORY_TRACT | 5 refills | Status: DC

## 2017-02-14 MED ORDER — SODIUM CHLORIDE 0.9 % IV BOLUS (SEPSIS)
1000.0000 mL | Freq: Once | INTRAVENOUS | Status: AC
  Administered 2017-02-14: 1000 mL via INTRAVENOUS

## 2017-02-14 MED ORDER — ACETAMINOPHEN 500 MG PO TABS
1000.0000 mg | ORAL_TABLET | Freq: Once | ORAL | Status: AC
  Administered 2017-02-14: 1000 mg via ORAL
  Filled 2017-02-14: qty 2

## 2017-02-14 MED ORDER — METHOCARBAMOL 500 MG PO TABS
500.0000 mg | ORAL_TABLET | Freq: Once | ORAL | Status: AC
Start: 2017-02-14 — End: 2017-02-14
  Administered 2017-02-14: 500 mg via ORAL
  Filled 2017-02-14: qty 1

## 2017-02-14 NOTE — ED Notes (Signed)
IV team at bedside 

## 2017-02-14 NOTE — ED Provider Notes (Addendum)
[facility] DEPT Provider Note   CSN: [id number] Arrival date & time: 02/14/17  1622     History   Chief Complaint Chief Complaint  Patient presents with  . Headache  . Chest Pain  . Dizziness    HPI Victoria Moore is a 41 y.o. female.  The history is provided by the patient.  Headache   This is a recurrent problem. Episode onset: 3 days ago after seizure where she sustained head trauma. The problem occurs constantly. The pain is located in the temporal region. The pain is moderate. Associated symptoms include shortness of breath and nausea. Pertinent negatives include no vomiting.  Chest Pain   This is a new problem. The current episode started yesterday. The problem occurs constantly. The problem has not changed since onset.The pain is associated with movement. The pain is present in the substernal region and lateral region. The pain is moderate. The quality of the pain is described as sharp. The pain does not radiate. Associated symptoms include dizziness, headaches, nausea and shortness of breath. Pertinent negatives include no abdominal pain, no cough, no lower extremity edema and no vomiting. PND: mild.  Her past medical history is significant for hypertension.  Pertinent negatives for past medical history include no CHF, no diabetes, no hyperlipidemia, no MI and no PE.  Her family medical history is significant for early MI.  Dizziness  Associated symptoms: chest pain, headaches, nausea and shortness of breath   Associated symptoms: no vomiting    H/o seizure. Reports having seizures on Friday and last night.  Reports being taken off her Dilantin 1 yr ago as she had not had seizures for several years. Placed back on Dilantin on Friday during her ED visit. No infectious symptoms. No GI symptoms other than nausea. States that she doesn't hydrate as she should.    Past Medical History:  Diagnosis Date  . Allergy   . Anemia   . Anxiety   . Arthritis   . Asthma   .  Blood transfusion without reported diagnosis   . Chronic kidney disease   . Common migraine with intractable migraine 10/07/2016  . Depression   . GERD (gastroesophageal reflux disease)   . Hypertension   . Migraine   . Sarcoidosis ([place])   . Seizures ([place])    last one year ago    Patient Active Problem List   Diagnosis Date Noted  . Essential hypertension 11/15/2016  . 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 Morbid (severe) obesity due to excess calories ([place]) 10/23/2016  . Cough variant asthma vs UACS  10/23/2016  . Common migraine with intractable migraine 10/07/2016  . Dyspnea 04/15/2016  . Hilar adenopathy 04/15/2016  . Convulsions/seizures ([place]) 03/11/2016  . Fibroids 03/11/2016  . Anemia 11/12/2014  . Symptomatic anemia 11/12/2014  . Menorrhagia 11/12/2014  . Iron deficiency 11/12/2014  . Right-sided chest pain 11/12/2014    Past Surgical History:  Procedure Laterality Date  . ABDOMINAL HYSTERECTOMY    . LAPAROSCOPIC OVARIAN CYSTECTOMY Left 03/11/2016   Procedure: LAPAROSCOPIC OVARIAN CYSTECTOMY;  Surgeon: Eldred Manges, MD;  Location: [facility];  Service: Gynecology;  Laterality: Left;  . LAPAROSCOPIC VAGINAL HYSTERECTOMY WITH SALPINGECTOMY Bilateral 03/11/2016   Procedure: LAPAROSCOPIC ASSISTED VAGINAL HYSTERECTOMY WITH SALPINGECTOMY;  Surgeon: Eldred Manges, MD;  Location: [facility];  Service: Gynecology;  Laterality: Bilateral;  . TUBAL LIGATION      OB History    Gravida Para Term Preterm AB Living   1  SAB TAB Ectopic Multiple Live Births                   Home Medications    Prior to Admission medications   Medication Sig Start Date End Date Taking? Authorizing Provider  acetaminophen-codeine (TYLENOL #4) 300-60 MG tablet Take 1 tablet by mouth every 6 (six) hours as needed for pain.  11/30/16  Yes Historical Provider, MD  ALPRAZolam Duanne Moron) 1 MG tablet Take 1 mg by mouth daily as needed for anxiety.   Yes Historical Provider, MD    amLODipine (NORVASC) 2.5 MG tablet Take 1 tablet by mouth daily. 01/29/17  Yes Historical Provider, MD  busPIRone (BUSPAR) 7.5 MG tablet Take 7.5 mg by mouth 2 (two) times daily. 01/04/17  Yes Historical Provider, MD  cyclobenzaprine (FLEXERIL) 10 MG tablet Take 10-20 mg by mouth at bedtime as needed for muscle spasms. 12/02/16  Yes Historical Provider, MD  DEXILANT 60 MG capsule Take 60 mg by mouth daily. 11/30/16  Yes Historical Provider, MD  LYRICA 75 MG capsule Take 75 mg by mouth 2 (two) times daily. 12/13/16  Yes Historical Provider, MD  mometasone-formoterol (DULERA) 100-5 MCG/ACT AERO Inhale 2 puffs into the lungs 2 (two) times daily. 02/14/17  Yes Tanda Rockers, MD  oxyCODONE-acetaminophen (PERCOCET/ROXICET) 5-325 MG tablet Take 2 tablets by mouth every 4 (four) hours as needed for severe pain. 02/13/17  Yes Lacretia Leigh, MD  sertraline (ZOLOFT) 50 MG tablet Take 50 mg by mouth daily. 01/19/17  Yes Historical Provider, MD  Vitamin D, Ergocalciferol, (DRISDOL) 50000 units CAPS capsule Take 50,000 Units by mouth every 7 (seven) days. On Saturday 01/26/17  Yes Historical Provider, MD  calcium carbonate (TUMS) 500 MG chewable tablet Chew 1 tablet (200 mg of elemental calcium total) by mouth 2 (two) times daily. 02/15/17 03/01/17  Fatima Blank, MD  magnesium gluconate (MAGONATE) 30 MG tablet Take 1 tablet (30 mg total) by mouth 2 (two) times daily. 02/15/17 03/01/17  Fatima Blank, MD  phenytoin (DILANTIN) 100 MG ER capsule Take 3 capsules (300 mg total) by mouth at bedtime. 02/12/17   Varney Biles, MD  potassium chloride SA (K-DUR,KLOR-CON) 20 MEQ tablet Take 1 tablet (20 mEq total) by mouth 2 (two) times daily. 02/15/17 03/01/17  Fatima Blank, MD  predniSONE (DELTASONE) 10 MG tablet Take 60 mg on 12/16, 17, 18, and 19.  Then go back to your regular dose Patient not taking: Reported on 02/13/2017 12/10/16   Isla Pence, MD  rizatriptan (MAXALT) 10 MG tablet TAKE 1 TABLET BY MOUTH  ONCE A DAY PRN FOR MIGRAINES 10/20/16   Historical Provider, MD    Family History Family History  Problem Relation Age of Onset  . Adopted: Yes  . Other Son     Growing pains  . Asthma Mother   . Heart Problems Mother   . Migraines Mother   . Diabetes Father   . Peptic Ulcer Father   . Heart Problems Father     Social History Social History  Substance Use Topics  . Smoking status: Former Smoker    Packs/day: 0.25    Years: 17.00    Types: Cigarettes    Quit date: 03/02/2013  . Smokeless tobacco: Never Used  . Alcohol use No     Comment: only on special occasions     Allergies   Aspirin   Review of Systems Review of Systems  Respiratory: Positive for shortness of breath. Negative for cough.   Cardiovascular:  Positive for chest pain. PND: mild.  Gastrointestinal: Positive for nausea. Negative for abdominal pain and vomiting.  Neurological: Positive for dizziness and headaches.  Ten systems are reviewed and are negative for acute change except as noted in the HPI    Physical Exam Updated Vital Signs BP 120/75 (BP Location: Left Arm)   Pulse 74   Temp 99 F (37.2 C) (Oral)   Resp 12   Ht 5\' 9"  (1.753 m)   Wt 285 lb 7 oz (129.5 kg)   LMP 02/19/2016   SpO2 100%   BMI 42.15 kg/m   Physical Exam  Constitutional: She is oriented to person, place, and time. She appears well-developed and well-nourished. No distress.  HENT:  Head: Normocephalic and atraumatic.  Nose: Nose normal.  Eyes: Conjunctivae and EOM are normal. Pupils are equal, round, and reactive to light. Right eye exhibits no discharge. Left eye exhibits no discharge. No scleral icterus.  Neck: Normal range of motion. Neck supple.  Cardiovascular: Normal rate and regular rhythm.  Exam reveals no gallop and no friction rub.   No murmur heard. Pulmonary/Chest: Effort normal and breath sounds normal. No stridor. No respiratory distress. She has no rales.     She exhibits tenderness.    Abdominal:  Soft. She exhibits no distension. There is no tenderness.  Musculoskeletal: She exhibits no edema or tenderness.  Neurological: She is alert and oriented to person, place, and time. She has normal strength. No cranial nerve deficit or sensory deficit.  Skin: Skin is warm and dry. No rash noted. She is not diaphoretic. No erythema.  Psychiatric: She has a normal mood and affect.  Vitals reviewed.    ED Treatments / Results  Labs (all labs ordered are listed, but only abnormal results are displayed) Labs Reviewed  BASIC METABOLIC PANEL - Abnormal; Notable for the following:       Result Value   Potassium 3.3 (*)    Chloride 116 (*)    CO2 21 (*)    Calcium 6.3 (*)    Anion gap 3 (*)    All other components within normal limits  CBC - Abnormal; Notable for the following:    WBC 3.2 (*)    Hemoglobin 11.7 (*)    Platelets 134 (*)    All other components within normal limits  HEPATIC FUNCTION PANEL - Abnormal; Notable for the following:    Total Protein 5.1 (*)    Albumin 2.6 (*)    AST 14 (*)    ALT 8 (*)    Alkaline Phosphatase 35 (*)    All other components within normal limits  MAGNESIUM - Abnormal; Notable for the following:    Magnesium 1.5 (*)    All other components within normal limits  PHENYTOIN LEVEL, FREE AND TOTAL  CALCIUM, IONIZED  I-STAT TROPOININ, ED    EKG  EKG Interpretation  Date/Time:  Monday February 14 2017 16:32:33 EST Ventricular Rate:  85 PR Interval:    QRS Duration: 99 QT Interval:  381 QTC Calculation: 453 R Axis:   57 Text Interpretation:  Sinus rhythm Low voltage, precordial leads No significant change since last tracing Confirmed by [facility] MD, Bronte Kropf ([id number]) on 02/15/2017 12:23:30 AM       Radiology Dg Chest 2 View  Result Date: 02/14/2017 CLINICAL DATA:  Chest pain, shortness of breath, headache, fever and dizziness. Possible seizure last night. EXAM: CHEST  2 VIEW COMPARISON:  PA and lateral chest 02/13/2017 and 12/10/2016.  FINDINGS: Lungs are  clear. Heart size is normal. No pneumothorax or pleural fluid. No bony abnormality. IMPRESSION: Negative chest. Electronically Signed   By: Inge Rise M.D.   On: 02/14/2017 17:50   Dg Chest 2 View  Result Date: 02/13/2017 CLINICAL DATA:  Acute onset of bilateral upper chest pain, chills. Syncope. Recent seizure. Initial encounter. EXAM: CHEST  2 VIEW COMPARISON:  Chest radiograph performed 12/10/2016 FINDINGS: The lungs are well-aerated and clear. There is no evidence of focal opacification, pleural effusion or pneumothorax. The heart is normal in size; the mediastinal contour is within normal limits. No acute osseous abnormalities are seen. IMPRESSION: No acute cardiopulmonary process seen. Electronically Signed   By: Garald Balding M.D.   On: 02/13/2017 18:21   Ct Head Wo Contrast  Result Date: 02/13/2017 CLINICAL DATA:  41 year old female with history of seizure. EXAM: CT HEAD WITHOUT CONTRAST TECHNIQUE: Contiguous axial images were obtained from the base of the skull through the vertex without intravenous contrast. COMPARISON:  Head CT 04/02/2014. FINDINGS: Brain: No evidence of acute infarction, hemorrhage, hydrocephalus, extra-axial collection or mass lesion/mass effect. Vascular: No hyperdense vessel or unexpected calcification. Skull: Normal. Negative for fracture or focal lesion. Sinuses/Orbits: No acute finding. Other: None. IMPRESSION: 1. No acute intracranial abnormalities. 2. The appearance of the brain is normal. Electronically Signed   By: Vinnie Langton M.D.   On: 02/13/2017 18:06    Procedures Procedures (including critical care time)  Medications Ordered in ED Medications  calcium carbonate (TUMS - dosed in mg elemental calcium) chewable tablet 200 mg of elemental calcium (not administered)  potassium chloride SA (K-DUR,KLOR-CON) CR tablet 40 mEq (not administered)  acetaminophen (TYLENOL) tablet 1,000 mg (1,000 mg Oral Given 02/14/17 1906)    methocarbamol (ROBAXIN) tablet 500 mg (500 mg Oral Given 02/14/17 1906)  sodium chloride 0.9 % bolus 1,000 mL (0 mLs Intravenous Stopped 02/15/17 0014)  fentaNYL (SUBLIMAZE) injection 50 mcg (50 mcg Intramuscular Given 02/14/17 2301)  magnesium sulfate IVPB 1 g 100 mL (0 g Intravenous Stopped 02/15/17 0014)     Initial Impression / Assessment and Plan / ED Course  I have reviewed the triage vital signs and the nursing notes.  Pertinent labs & imaging results that were available during my care of the patient were reviewed by me and considered in my medical decision making (see chart for details).     1. Chest pain Atypical chest pain most consistent with chest wall pain likely secondary to recent seizures. EKG without acute ischemic changes or evidence of pericarditis. Chest x-ray without evidence suggestive of pneumonia, pneumothorax, pneumomediastinum.  No abnormal contour of the mediastinum to suggest dissection. No evidence of acute injuries. Initial troponin negative. Feel this is appropriate to rule out ACS. Low suspicion for pulmonary embolism. Presentation is classic for aortic dissection or esophageal perforation.  2. Headache Onset was following seizure where she sustained head trauma. Initial assessment on Friday. Revealed negative CT scan. Likely due to postconcussive syndrome. Provided with pain meds which had some improvement.  3. Recurrent seizures Just started on her Dilantin that will need to reach therapeutic levels.  On screening labs patient was noted to have hypocalcemia of 6.3 which corrected to 7.4 for low albumin. Patient is asymptomatic from her hypocalcemia (no findings of tetany [[place]/Trousseau] on exam repeated after labs results). Unsure of etiology, but possibility that this maybe 2/2 dilantin as her levels were normal recently.  Found to have hypomagnesemia and mild hypokalemia as well. Magnesium, calcium, and K+ repleted.   No indication  for admission at this  time. The patient is safe for discharge with strict return precautions.  Final Clinical Impressions(s) / ED Diagnoses   Final diagnoses:  Seizure ([place])  Concussion with loss of consciousness of 30 minutes or less, subsequent encounter  Chest wall pain  Hypomagnesemia  Hypocalcemia  Hypokalemia  Hypoalbuminemia  Postconcussion syndrome   Disposition: Discharge  Condition: Good  I have discussed the results, Dx and Tx plan with the patient who expressed understanding and agree(s) with the plan. Discharge instructions discussed at great length. The patient was given strict return precautions who verbalized understanding of the instructions. No further questions at time of discharge.    New Prescriptions   CALCIUM CARBONATE (TUMS) 500 MG CHEWABLE TABLET    Chew 1 tablet (200 mg of elemental calcium total) by mouth 2 (two) times daily.   MAGNESIUM GLUCONATE (MAGONATE) 30 MG TABLET    Take 1 tablet (30 mg total) by mouth 2 (two) times daily.   POTASSIUM CHLORIDE SA (K-DUR,KLOR-CON) 20 MEQ TABLET    Take 1 tablet (20 mEq total) by mouth 2 (two) times daily.    Follow Up: Jonathon Jordan, MD [place] [address] [address] [phone]  In 3 days For close follow up to assess for electrolyte recheck  Neurologist   as scheduled for seizure medication management and postconcussive syndrome        Fatima Blank, MD 02/15/17 [phone]

## 2017-02-14 NOTE — ED Notes (Signed)
Patient stated that she typically needs ultrasound iv. Patient allowed this tech to attempt to stick her.This tech was unsuccessful at blood draw.

## 2017-02-14 NOTE — ED Triage Notes (Signed)
Pt states that she is experiencing centralized CP, SOB, headache, and dizziness. She also states that she had a "small seizure" last night. Also c/o dry mouth and nausea. Hx epilepsy. Pt was seen last night for same.

## 2017-02-14 NOTE — ED Notes (Signed)
Spoke with main lab, will use blood already in lab for Hepatic Function Panel test.

## 2017-02-14 NOTE — ED Notes (Signed)
Attempted an IV x 2 in Left FA and Left AC with no success.

## 2017-02-14 NOTE — ED Notes (Signed)
Patient states the only change from last nights visit is increase in headache and dizziness. Denies another seizure.

## 2017-02-14 NOTE — ED Notes (Signed)
Provided a cup of ice water. Encouraged to drink fluids.

## 2017-02-14 NOTE — ED Notes (Addendum)
With permission from [place], MD, gave pt a [place] sandwich, graham crackers w/ peanut butter, and a diet ginger ale.

## 2017-02-15 MED ORDER — MAGNESIUM GLUCONATE 30 MG PO TABS: 30.0000 mg | ORAL_TABLET | Freq: Two times a day (BID) | ORAL | 0 refills | Status: AC

## 2017-02-15 MED ORDER — POTASSIUM CHLORIDE CRYS ER 20 MEQ PO TBCR
40.0000 meq | EXTENDED_RELEASE_TABLET | Freq: Once | ORAL | Status: AC
  Administered 2017-02-15: 40 meq via ORAL
  Filled 2017-02-15: qty 2

## 2017-02-15 MED ORDER — CALCIUM CARBONATE ANTACID 500 MG PO CHEW: 1.0000 | CHEWABLE_TABLET | Freq: Two times a day (BID) | ORAL | 0 refills | Status: AC

## 2017-02-15 MED ORDER — CALCIUM CARBONATE ANTACID 500 MG PO CHEW
1.0000 | CHEWABLE_TABLET | Freq: Once | ORAL | Status: AC
  Administered 2017-02-15: 200 mg via ORAL
  Filled 2017-02-15: qty 1

## 2017-02-15 MED ORDER — POTASSIUM CHLORIDE CRYS ER 20 MEQ PO TBCR: 20.0000 meq | EXTENDED_RELEASE_TABLET | Freq: Two times a day (BID) | ORAL | 0 refills | Status: DC

## 2017-02-15 NOTE — ED Notes (Signed)
Dr. Evlyn Courier at bedside.

## 2017-02-16 LAB — PHENYTOIN LEVEL, FREE AND TOTAL
Phenytoin, Free: 0.6 ug/mL — ABNORMAL LOW (ref 1.0–2.0)
Phenytoin, Total: 7.6 ug/mL — ABNORMAL LOW (ref 10.0–20.0)

## 2017-02-16 LAB — CALCIUM, IONIZED: Calcium, Ionized, Serum: 4.5 mg/dL (ref 4.5–5.6)

## 2017-02-17 ENCOUNTER — Emergency Department (HOSPITAL_COMMUNITY): Payer: BLUE CROSS/BLUE SHIELD

## 2017-02-17 ENCOUNTER — Telehealth: Payer: Self-pay | Admitting: Neurology

## 2017-02-17 ENCOUNTER — Encounter (HOSPITAL_COMMUNITY): Payer: Self-pay | Admitting: Emergency Medicine

## 2017-02-17 ENCOUNTER — Emergency Department (HOSPITAL_COMMUNITY)
Admission: EM | Admit: 2017-02-17 | Discharge: 2017-02-17 | Disposition: A | Discharge status: Home / Self Care | Payer: BLUE CROSS/BLUE SHIELD | Attending: Emergency Medicine | Admitting: Emergency Medicine

## 2017-02-17 DIAGNOSIS — R079 Chest pain, unspecified: Secondary | ICD-10-CM | POA: Diagnosis not present

## 2017-02-17 DIAGNOSIS — J45909 Unspecified asthma, uncomplicated: Secondary | ICD-10-CM | POA: Diagnosis not present

## 2017-02-17 DIAGNOSIS — I129 Hypertensive chronic kidney disease with stage 1 through stage 4 chronic kidney disease, or unspecified chronic kidney disease: Secondary | ICD-10-CM | POA: Diagnosis not present

## 2017-02-17 DIAGNOSIS — Z87891 Personal history of nicotine dependence: Secondary | ICD-10-CM | POA: Diagnosis not present

## 2017-02-17 DIAGNOSIS — N189 Chronic kidney disease, unspecified: Secondary | ICD-10-CM | POA: Diagnosis not present

## 2017-02-17 LAB — CBC
HCT: 36 % (ref 36.0–46.0)
Hemoglobin: 11.6 g/dL — ABNORMAL LOW (ref 12.0–15.0)
MCH: 27.1 pg (ref 26.0–34.0)
MCHC: 32.2 g/dL (ref 30.0–36.0)
MCV: 84.1 fL (ref 78.0–100.0)
Platelets: 187 10*3/uL (ref 150–400)
RBC: 4.28 MIL/uL (ref 3.87–5.11)
RDW: 13.9 % (ref 11.5–15.5)
WBC: 5.7 10*3/uL (ref 4.0–10.5)

## 2017-02-17 LAB — BASIC METABOLIC PANEL
Anion gap: 6 (ref 5–15)
BUN: 11 mg/dL (ref 6–20)
CO2: 27 mmol/L (ref 22–32)
Calcium: 8.6 mg/dL — ABNORMAL LOW (ref 8.9–10.3)
Chloride: 104 mmol/L (ref 101–111)
Creatinine, Ser: 0.85 mg/dL (ref 0.44–1.00)
GFR calc Af Amer: >60 mL/min (ref >60)
GFR calc non Af Amer: >60 mL/min (ref >60)
Glucose, Bld: 95 mg/dL (ref 65–99)
Potassium: 3.8 mmol/L (ref 3.5–5.1)
Sodium: 137 mmol/L (ref 135–145)

## 2017-02-17 LAB — I-STAT TROPONIN, ED: Troponin i, poc: 0 ng/mL (ref 0.00–0.08)

## 2017-02-17 NOTE — ED Notes (Signed)
Pt refused d/c vitals.

## 2017-02-17 NOTE — ED Triage Notes (Signed)
Patient c/o constant sharp central chest pain with headache and dizziness since Friday. Denies radiation. Denies N/V/D and abdominal pain. Ambulatory to triage.

## 2017-02-17 NOTE — Telephone Encounter (Signed)
Not sure I will be able to see this patient tomorrow, but can try to work in next week.

## 2017-02-17 NOTE — Discharge Instructions (Signed)

## 2017-02-17 NOTE — ED Provider Notes (Signed)
Emergency Department Provider Note   I have reviewed the triage vital signs and the nursing notes.   HISTORY  Chief Complaint Chest Pain   HPI Victoria Moore is a 41 y.o. female with PMH of anemia, GERD, depression, CKD, seizures, presents to the emergency department for evaluation of continued chest pain. Patient had a seizure several days ago and since that time she has had a constant bandlike pain across her chest. The pain is not worsening but not improving. She describes it as sharp, nonpleuritic, nonexertional. No radiation. She is tried Tylenol and oxycodone at home with no relief in symptoms. Denies any associated headache, difficulty breathing, fever, or chills. No history of CAD. No history of DVT or PE.    Past Medical History:  Diagnosis Date  . Allergy   . Anemia   . Anxiety   . Arthritis   . Asthma   . Blood transfusion without reported diagnosis   . Chronic kidney disease   . Common migraine with intractable migraine 10/07/2016  . Depression   . GERD (gastroesophageal reflux disease)   . Hypertension   . Migraine   . Sarcoidosis ([place])   . Seizures ([place])    last one year ago    Patient Active Problem List   Diagnosis Date Noted  . Essential hypertension 11/15/2016  . 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 Morbid (severe) obesity due to excess calories ([employer]) 10/23/2016  . Cough variant asthma vs UACS  10/23/2016  . Common migraine with intractable migraine 10/07/2016  . Dyspnea 04/15/2016  . Hilar adenopathy 04/15/2016  . Convulsions/seizures ([place]) 03/11/2016  . Fibroids 03/11/2016  . Anemia 11/12/2014  . Symptomatic anemia 11/12/2014  . Menorrhagia 11/12/2014  . Iron deficiency 11/12/2014  . Right-sided chest pain 11/12/2014    Past Surgical History:  Procedure Laterality Date  . ABDOMINAL HYSTERECTOMY    . LAPAROSCOPIC OVARIAN CYSTECTOMY Left 03/11/2016   Procedure: LAPAROSCOPIC OVARIAN CYSTECTOMY;  Surgeon: Eldred Manges, MD;  Location:  [facility];  Service: Gynecology;  Laterality: Left;  . LAPAROSCOPIC VAGINAL HYSTERECTOMY WITH SALPINGECTOMY Bilateral 03/11/2016   Procedure: LAPAROSCOPIC ASSISTED VAGINAL HYSTERECTOMY WITH SALPINGECTOMY;  Surgeon: Eldred Manges, MD;  Location: [facility];  Service: Gynecology;  Laterality: Bilateral;  . TUBAL LIGATION      Current Outpatient Rx  . Order #: [id number] Class: Historical Med  . Order #: [id number] Class: Historical Med  . Order #: [id number] Class: Historical Med  . Order #: [id number] Class: Historical Med  . Order #: [id number] Class: Print  . Order #: [id number] Class: Historical Med  . Order #: [id number] Class: Historical Med  . Order #: [id number] Class: Historical Med  . Order #: [id number] Class: Print  . Order #: [id number] Class: Normal  . Order #: [id number] Class: Print  . Order #: [id number] Class: Print  . Order #: [id number] Class: Print  . Order #: [id number] Class: Historical Med  . Order #: [id number] Class: Historical Med    Allergies Aspirin  Family History  Problem Relation Age of Onset  . Adopted: Yes  . Other Son     Growing pains  . Asthma Mother   . Heart Problems Mother   . Migraines Mother   . Diabetes Father   . Peptic Ulcer Father   . Heart Problems Father     Social History Social History  Substance Use Topics  . Smoking status: Former Smoker    Packs/day: 0.25    Years: 17.00    Types: Cigarettes    Quit date: 03/02/2013  . Smokeless tobacco: Never  Used  . Alcohol use No     Comment: only on special occasions    Review of Systems  Constitutional: No fever/chills Eyes: No visual changes. ENT: No sore throat. Cardiovascular: Positive chest pain. Respiratory: Denies shortness of breath. Gastrointestinal: No abdominal pain.  No nausea, no vomiting.  No diarrhea.  No constipation. Genitourinary: Negative for dysuria. Musculoskeletal: Negative for back pain. Skin: Negative for rash. Neurological: Negative for headaches, focal weakness or  numbness.  10-point ROS otherwise negative.  ____________________________________________   PHYSICAL EXAM:  VITAL SIGNS: ED Triage Vitals  Enc Vitals Group     BP 02/17/17 1358 119/95     Pulse Rate 02/17/17 1358 73     Resp 02/17/17 1358 16     Temp 02/17/17 1358 98.3 F (36.8 C)     Temp Source 02/17/17 1358 Oral     SpO2 02/17/17 1358 100 %     Weight 02/17/17 1356 294 lb (133.4 kg)     Height 02/17/17 1356 5\' 9"  (1.753 m)     Pain Score 02/17/17 1356 10   Constitutional: Alert and oriented. Well appearing and in no acute distress. Eyes: Conjunctivae are normal.  Head: Atraumatic. Nose: No congestion/rhinnorhea. Mouth/Throat: Mucous membranes are moist.  Oropharynx non-erythematous. Neck: No stridor.  Cardiovascular: Normal rate, regular rhythm. Good peripheral circulation. Grossly normal heart sounds.   Respiratory: Normal respiratory effort.  No retractions. Lungs CTAB. Gastrointestinal: Soft and nontender. No distention.  Musculoskeletal: No lower extremity tenderness nor edema. No gross deformities of extremities. Neurologic:  Normal speech and language. No gross focal neurologic deficits are appreciated.  Skin:  Skin is warm, dry and intact. No rash noted.  ____________________________________________   LABS (all labs ordered are listed, but only abnormal results are displayed)  Labs Reviewed  BASIC METABOLIC PANEL - Abnormal; Notable for the following:       Result Value   Calcium 8.6 (*)    All other components within normal limits  CBC - Abnormal; Notable for the following:    Hemoglobin 11.6 (*)    All other components within normal limits  I-STAT TROPOININ, ED   ____________________________________________  EKG   EKG Interpretation  Date/Time:  Thursday February 17 2017 13:57:13 EST Ventricular Rate:  70 PR Interval:    QRS Duration: 95 QT Interval:  413 QTC Calculation: 446 R Axis:   87 Text Interpretation:  Sinus rhythm Low voltage,  precordial leads Borderline T abnormalities, anterior leads No STEMI.  Confirmed by Tatianna Ibbotson MD, [place] Rosamond [phone]) on 02/17/2017 2:07:15 PM Also confirmed by Rameses Ou MD, Renly Roots [phone]), editor [place] CT, Leda Gauze [phone])  on 02/17/2017 2:34:24 PM       ____________________________________________  RADIOLOGY  Dg Chest 2 View  Result Date: 02/17/2017 CLINICAL DATA:  Had a seizure on Friday, still having headaches, dizziness and chest pain, former smoker, hypertension EXAM: CHEST  2 VIEW COMPARISON:  02/14/2017 FINDINGS: Normal heart size, mediastinal contours, and pulmonary vascularity. Mild chronic bronchitic changes. Lungs otherwise clear. No pleural effusion or pneumothorax. No acute bony abnormalities. IMPRESSION: Chronic bronchitic changes without acute infiltrate. Electronically Signed   By: Lavonia Dana M.D.   On: 02/17/2017 15:30    ____________________________________________   PROCEDURES  Procedure(s) performed:   Procedures  None ____________________________________________   INITIAL IMPRESSION / ASSESSMENT AND PLAN / ED COURSE  Pertinent labs & imaging results that were available during my care of the patient were reviewed by me and considered in my medical decision making (see chart for details).  Patient resents to the emergency department for evaluation of several days of constant pain after seizure. Her EKG shows no evidence of acute ischemia. She's been seen several times for same with normal troponin. PERC negative and low PE risk by [facility]. Troponin and CXR ordered from triage. Suspect MSK etiology as the underlying pain etiology. Discussed this with the patient.   Differential includes all life-threatening causes for chest pain. This includes but is not exclusive to acute coronary syndrome, aortic dissection, pulmonary embolism, cardiac tamponade, community-acquired pneumonia, pericarditis, musculoskeletal chest wall pain, etc.  03:35 PM Labs and CXR are unremarkable. Plan for  supportive care at home and PCP follow up to follow symptoms. Advise Tylenol/Motrin along with heating pad.   At this time, I do not feel there is any life-threatening condition present. I have reviewed and discussed all results (EKG, imaging, lab, urine as appropriate), exam findings with patient. I have reviewed nursing notes and appropriate previous records.  I feel the patient is safe to be discharged home without further emergent workup. Discussed usual and customary return precautions. Patient and family (if present) verbalize understanding and are comfortable with this plan.  Patient will follow-up with their primary care provider. If they do not have a primary care provider, information for follow-up has been provided to them. All questions have been answered.  ____________________________________________  FINAL CLINICAL IMPRESSION(S) / ED DIAGNOSES  Final diagnoses:  Nonspecific chest pain     MEDICATIONS GIVEN DURING THIS VISIT:  None  NEW OUTPATIENT MEDICATIONS STARTED DURING THIS VISIT:  None   Note:  This document was prepared using Dragon voice recognition software and may include unintentional dictation errors.  Nanda Quinton, MD Emergency Medicine   Margette Fast, MD 02/17/17 [id number]

## 2017-02-17 NOTE — ED Notes (Signed)
Ambulated out of the ED with a steady gait.

## 2017-02-17 NOTE — Telephone Encounter (Signed)
Dr Jannifer Franklin- please advise. Pt d/c from hospital 02/15/17. Looks like she has f/u already on 03/18/17 with CM,NP. Ok to keep this?

## 2017-02-17 NOTE — Telephone Encounter (Signed)
Pt called said Dr Chriss Czar office was to call to get her seen tomorrow. She was seen in ED 2/19 for seizures. Please call to advise when she should be seen again

## 2017-02-18 NOTE — Telephone Encounter (Signed)
Called and spoke to pt. Scheduled appt for 02/21/17 430pm, check in 400pm. Pt verbalized understanding and agreeable to this.

## 2017-02-21 ENCOUNTER — Ambulatory Visit (INDEPENDENT_AMBULATORY_CARE_PROVIDER_SITE_OTHER): Payer: BLUE CROSS/BLUE SHIELD | Admitting: Neurology

## 2017-02-21 ENCOUNTER — Encounter: Payer: Self-pay | Admitting: Neurology

## 2017-02-21 VITALS — BP 118/89 | HR 80 | Ht 69.0 in | Wt 294.0 lb

## 2017-02-21 DIAGNOSIS — R569 Unspecified convulsions: Secondary | ICD-10-CM | POA: Diagnosis not present

## 2017-02-21 MED ORDER — TOPIRAMATE 25 MG PO TABS: 50.0000 mg | ORAL_TABLET | Freq: Two times a day (BID) | ORAL | 3 refills | Status: DC

## 2017-02-21 NOTE — Progress Notes (Signed)
Reason for visit: Seizures  Victoria Moore is an 41 y.o. female  History of present illness:  Ms. Victoria Moore is a 41 year old right-handed lack female with a history of seizures. The patient last had a seizure in 2016, but she had a recurring event on 02/11/2017. The patient fell forward and struck her head with a seizure. She was seen in the emergency room on 02/14/2017 with a severe headache. The patient was placed on Dilantin at that time. The patient was seen through this office in October 2017, she had not been taking the Dilantin regularly, she was taken off of Dilantin and was placed on Topamax on her last visit. The patient took the prescription, but did not have medical insurance and she stopped the medication. The patient was on no anticonvulsants at the time of the seizure. The patient currently is not operating a motor vehicle. 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 She is having daily headaches at this time associated with a pressure feeling, photophobia, phonophobia, and some nausea. The patient has been having about 3 headaches a week prior to the seizure event. The patient did undergo a CT scan of the brain in the emergency room that was unremarkable. She comes to this office for an evaluation.  Past Medical History:  Diagnosis Date  . Allergy   . Anemia   . Anxiety   . Arthritis   . Asthma   . Blood transfusion without reported diagnosis   . Chronic kidney disease   . Common migraine with intractable migraine 10/07/2016  . Depression   . GERD (gastroesophageal reflux disease)   . Hypertension   . Migraine   . Sarcoidosis ([place])   . Seizures ([place])    last one year ago    Past Surgical History:  Procedure Laterality Date  . ABDOMINAL HYSTERECTOMY    . LAPAROSCOPIC OVARIAN CYSTECTOMY Left 03/11/2016   Procedure: LAPAROSCOPIC OVARIAN CYSTECTOMY;  Surgeon: Eldred Manges, MD;  Location: [facility];  Service: Gynecology;  Laterality: Left;  . LAPAROSCOPIC  VAGINAL HYSTERECTOMY WITH SALPINGECTOMY Bilateral 03/11/2016   Procedure: LAPAROSCOPIC ASSISTED VAGINAL HYSTERECTOMY WITH SALPINGECTOMY;  Surgeon: Eldred Manges, MD;  Location: [facility];  Service: Gynecology;  Laterality: Bilateral;  . TUBAL LIGATION      Family History  Problem Relation Age of Onset  . Adopted: Yes  . Other Son     Growing pains  . Asthma Mother   . Heart Problems Mother   . Migraines Mother   . Diabetes Father   . Peptic Ulcer Father   . Heart Problems Father     Social history:  reports that she quit smoking about 3 years ago. Her smoking use included Cigarettes. She has a 4.25 pack-year smoking history. She has never used smokeless tobacco. She reports that she does not drink alcohol or use drugs.    Allergies  Allergen Reactions  . Aspirin Anaphylaxis and Hives         Medications:  Prior to Admission medications   Medication Sig Start Date End Date Taking? Authorizing Provider  acetaminophen-codeine (TYLENOL #4) 300-60 MG tablet Take 1 tablet by mouth every 6 (six) hours as needed for pain.    Yes Historical Provider, MD  ALPRAZolam Duanne Moron) 1 MG tablet Take 1 mg by mouth daily as needed for anxiety.   Yes Historical Provider, MD  amLODipine (NORVASC) 2.5 MG tablet Take 2.5 mg by mouth daily.    Yes Historical Provider, MD  busPIRone (BUSPAR) 7.5 MG tablet Take 7.5 mg by mouth 2 (two) times daily.   Yes Historical Provider, MD  calcium carbonate (TUMS) 500 MG chewable tablet Chew 1 tablet (200 mg of elemental calcium total) by mouth 2 (two) times daily. 02/15/17 03/01/17 Yes Fatima Blank, MD  cyclobenzaprine (FLEXERIL) 10 MG tablet Take 10-20 mg by mouth at bedtime as needed for muscle spasms.   Yes Historical Provider, MD  DEXILANT 60 MG capsule Take 60 mg by mouth daily.   Yes Historical Provider, MD  LYRICA 75 MG capsule Take 75 mg by mouth 2 (two) times daily.   Yes Historical Provider, MD  magnesium gluconate (MAGONATE) 30 MG tablet Take 1  tablet (30 mg total) by mouth 2 (two) times daily. 02/15/17 03/01/17 Yes Pedro Aretha Parrot, MD  mometasone-formoterol (DULERA) 100-5 MCG/ACT AERO Inhale 2 puffs into the lungs 2 (two) times daily. 02/14/17  Yes Tanda Rockers, MD  oxyCODONE-acetaminophen (PERCOCET/ROXICET) 5-325 MG tablet Take 2 tablets by mouth every 4 (four) hours as needed for severe pain. 02/13/17  Yes Lacretia Leigh, MD  phenytoin (DILANTIN) 100 MG ER capsule Take 3 capsules (300 mg total) by mouth at bedtime. 02/12/17  Yes Varney Biles, MD  potassium chloride SA (K-DUR,KLOR-CON) 20 MEQ tablet Take 1 tablet (20 mEq total) by mouth 2 (two) times daily. 02/15/17 03/01/17 Yes Fatima Blank, MD  rizatriptan (MAXALT) 10 MG tablet Take 10 mg by mouth every 2 (two) hours as needed for migraine.   Yes Historical Provider, MD  sertraline (ZOLOFT) 50 MG tablet Take 50 mg by mouth daily.   Yes Historical Provider, MD  topiramate (TOPAMAX) 25 MG tablet Take 2 tablets (50 mg total) by mouth 2 (two) times daily. 02/21/17   Kathrynn Ducking, MD    ROS:  Out of a complete 14 system review of symptoms, the patient complains only of the following symptoms, and all other reviewed systems are negative.  Decreased activity, appetite change, chills, fatigue, fever Facial swelling, hearing loss, ringing in the ears, trouble swallowing Light sensitivity, double vision, loss of vision Chest pain, leg swelling Excessive thirst Nausea, vomiting Insomnia, sleep apnea, frequent waking, snoring Swollen lymph nodes, bruising easily, anemia Memory loss, dizziness, headache, seizures, weakness, passing out Agitation, depression, anxiety Itching  Blood pressure 118/89, pulse 80, height 5\' 9"  (1.753 m), weight 294 lb (133.4 kg), last menstrual period 02/19/2016.  Physical Exam  General: The patient is alert and cooperative at the time of the examination. The patient is markedly obese.  Skin: No significant peripheral edema is  noted.   Neurologic Exam  Mental status: The patient is alert and oriented x 3 at the time of the examination. The patient has apparent normal recent and remote memory, with an apparently normal attention span and concentration ability.   Cranial nerves: Facial symmetry is present. Speech is normal, no aphasia or dysarthria is noted. Extraocular movements are full. Visual fields are full.  Motor: The patient has good strength in all 4 extremities.  Sensory examination: Soft touch sensation is symmetric on the face, arms, and legs.  Coordination: The patient has good finger-nose-finger and heel-to-shin bilaterally.  Gait and station: The patient has a normal gait. Tandem gait is normal. Romberg is negative. No drift is seen.  Reflexes: Deep tendon reflexes are symmetric.   Assessment/Plan:  1. History of seizure disorder, recent recurrence  2. Possible concussion  3. History of migraine headache  The patient had gone off of her Topamax, she  suffered a seizure event. The patient is back on Dilantin, but she is having see if he problems with insomnia, headache, and she has also been seen for chest pain. The patient will be placed back on Topamax, when she works up to a dose of 50 mg twice daily, she is to call our office for a taper off of the Topamax. She has not operate a motor vehicle for least 6 months. I have indicated that she is to remain out of work for 14 days. She works as a Training and development officer.  Jill Alexanders MD 02/21/2017 4:57 PM  [facility] [address] [place], [place] [address]  Phone [phone] Fax [phone]

## 2017-02-21 NOTE — Patient Instructions (Signed)
   With the topamax 25 mg take one twice a day for 2 weeks, then take 2 twice a day, call our office after another week, and we will start a taper off of Dilantin.  Topamax (topiramate) is a seizure medication that has an FDA approval for seizures and for migraine headache. Potential side effects of this medication include weight loss, cognitive slowing, tingling in the fingers and toes, and carbonated drinks will taste bad. If any significant side effects are noted on this drug, please contact our office.

## 2017-02-28 ENCOUNTER — Telehealth: Payer: Self-pay | Admitting: Neurology

## 2017-02-28 MED ORDER — TRAZODONE HCL 100 MG PO TABS: 100.0000 mg | ORAL_TABLET | Freq: Every day | ORAL | 1 refills | Status: DC

## 2017-02-28 NOTE — Telephone Encounter (Signed)
Dr Willis- please advise 

## 2017-02-28 NOTE — Addendum Note (Signed)
Addended by: Margette Fast on: 02/28/2017 03:42 PM   Modules accepted: Orders

## 2017-02-28 NOTE — Telephone Encounter (Signed)
Patient stating she still cannot sleep and has dizziness real bad.

## 2017-02-28 NOTE — Telephone Encounter (Signed)
The patient has had some diffculty with sleeping and with dizziness following her concussion. The patient has alprazolam to take at night, but she claims this does not help her sleep. I will give her trial on trazodone 100 mg at night, can increase to 200 mg if needed.

## 2017-03-09 ENCOUNTER — Encounter: Payer: Self-pay | Admitting: Neurology

## 2017-03-09 NOTE — Addendum Note (Signed)
Addended by: Margette Fast on: 03/09/2017 09:47 AM   Modules accepted: Orders

## 2017-03-09 NOTE — Telephone Encounter (Signed)
Pt called said the medication helped at first with sleep but not now. Said she is not sleeping, still dizzy and still having HA's. Pt says she went back to work 3/12 but the noise was too much and she had to leave.

## 2017-03-09 NOTE — Telephone Encounter (Signed)
I called the patient. She initially had a good response to the trazodone, but now she is not sleeping. We will go up to 150 mg at night, if this is not effective, we will go up to 200 mg. She is to stop the Zoloft as we go up on the trazodone.  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

## 2017-03-09 NOTE — Telephone Encounter (Signed)
Noted, placed up front for pick up

## 2017-03-09 NOTE — Telephone Encounter (Addendum)
Called and LVM for pt. Advised I received her message from phone staff that she called with a fax number to fax to her school: [phone].  Advised we cannot fax without signed release form. She can come to office to pick up letter, I can mail it to her, or she can come sign release form and we are happy to fax once we have this. Asked her to call back to let us know how she would like to proceed.

## 2017-03-09 NOTE — Telephone Encounter (Signed)
Pt returned RN's call. Said she will come to the office today and sign release and pick up letter.

## 2017-03-18 ENCOUNTER — Ambulatory Visit: Payer: Self-pay | Admitting: Nurse Practitioner

## 2017-05-02 ENCOUNTER — Telehealth: Payer: Self-pay | Admitting: Internal Medicine

## 2017-05-02 NOTE — Telephone Encounter (Signed)
Spoke with pt and informed her we currently do not have samples of Dulera. She had no further questions. Nothing further is needed

## 2017-05-05 ENCOUNTER — Telehealth: Payer: Self-pay | Admitting: Internal Medicine

## 2017-05-05 NOTE — Telephone Encounter (Signed)
We do not have samples of Dulera.  ATC x 1, unable to leave a voicemail. WCB

## 2017-05-06 ENCOUNTER — Telehealth: Payer: Self-pay | Admitting: Internal Medicine

## 2017-05-06 MED ORDER — BUDESONIDE-FORMOTEROL FUMARATE 80-4.5 MCG/ACT IN AERO: 2.0000 | INHALATION_SPRAY | Freq: Two times a day (BID) | RESPIRATORY_TRACT | 0 refills | Status: DC

## 2017-05-06 NOTE — Telephone Encounter (Signed)
Called and spoke with pt and she is aware of sample that has been left up front for her to try the symbicort 80 2 puffs bid.  She will come in on 5/21 to see TP with her formulary to review her covered meds.  Nothing further is needed.

## 2017-05-06 NOTE — Telephone Encounter (Signed)
She was no show at her last ov so this is the last we can attempt to help her over the phone until or unless she makes/keeps appt   Give sample of symb 80 2bid and set up with Tammy NP 2 weeks with formulary to pick best options  For longterm pulmonary meds

## 2017-05-06 NOTE — Telephone Encounter (Signed)
Pt called for samples of the dulera earlier this morning.  We have none in the cabinet at this time.  Pt is requesting an alternative for dulera and that this be sent to her pharmacy.  MW please advise. Thanks  Allergies  Allergen Reactions  . Aspirin Anaphylaxis and Hives         Tylenol arthritis as needed  Please see patient coordinator before you leave today  to schedule rheumatologist / Dr Charlestine Night   Please schedule a follow up visit in 3 months but call sooner if needed     Instructions   Tylenol arthritis as needed  Please see patient coordinator before you leave today  to schedule rheumatologist / Dr Charlestine Night   Please schedule a follow up visit in 3 months but call sooner if needed      After Visit Summary (Printed 11/10/2016)

## 2017-05-06 NOTE — Telephone Encounter (Signed)
Called and spoke with pt and she is aware that we have no samples in the cabinet at this time.

## 2017-05-16 ENCOUNTER — Ambulatory Visit: Payer: BLUE CROSS/BLUE SHIELD | Admitting: Adult Health

## 2017-06-20 NOTE — Telephone Encounter (Signed)
Addendum: Samples for Symbicort 83mcg were placed up front to pick up 05/06/2017. These were never picked up.  Samples have been logged back in and put back on the shelf. Nothing further needed.

## 2017-06-24 ENCOUNTER — Telehealth: Payer: Self-pay | Admitting: Internal Medicine

## 2017-06-24 MED ORDER — MOMETASONE FURO-FORMOTEROL FUM 100-5 MCG/ACT IN AERO: 2.0000 | INHALATION_SPRAY | Freq: Two times a day (BID) | RESPIRATORY_TRACT | 0 refills | Status: DC

## 2017-06-24 NOTE — Telephone Encounter (Signed)
2 samples have been left up front and I have attempted to call the pt but had to leave a VM.  Waiting for pt to call back to make her aware of samples up front.

## 2017-06-27 NOTE — Telephone Encounter (Signed)
Attempted to call the pt again and the VM is full.  Will try back.

## 2017-06-28 NOTE — Telephone Encounter (Signed)
ATC, voicemail full 

## 2017-06-30 NOTE — Telephone Encounter (Signed)
lmomtcb x 4 .   I will close this message per triage protocol
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
## 2017-09-22 ENCOUNTER — Encounter (HOSPITAL_COMMUNITY): Payer: Self-pay | Admitting: Emergency Medicine

## 2017-09-22 ENCOUNTER — Emergency Department (HOSPITAL_COMMUNITY)
Admission: EM | Admit: 2017-09-22 | Discharge: 2017-09-22 | Disposition: A | Discharge status: Home / Self Care | Payer: BLUE CROSS/BLUE SHIELD | Attending: Emergency Medicine | Admitting: Emergency Medicine

## 2017-09-22 DIAGNOSIS — Z5321 Procedure and treatment not carried out due to patient leaving prior to being seen by health care provider: Secondary | ICD-10-CM | POA: Diagnosis not present

## 2017-09-22 DIAGNOSIS — G43909 Migraine, unspecified, not intractable, without status migrainosus: Secondary | ICD-10-CM | POA: Insufficient documentation

## 2017-09-22 NOTE — ED Triage Notes (Signed)
Patient c/o migraine that has been going on for 4 days with n/v and light sensitivity.  Patient has PMH migraines and seizures.

## 2017-09-22 NOTE — ED Triage Notes (Signed)
Patient states that she has anxiety and ben having issues with it for about a week now and medications arent helping.

## 2017-09-22 NOTE — ED Notes (Signed)
Per registration, patient reports she is leaving. ?

## 2017-09-23 ENCOUNTER — Emergency Department (HOSPITAL_COMMUNITY)
Admission: EM | Admit: 2017-09-23 | Discharge: 2017-09-23 | Disposition: A | Discharge status: Home / Self Care | Payer: BLUE CROSS/BLUE SHIELD | Attending: Emergency Medicine | Admitting: Emergency Medicine

## 2017-09-23 ENCOUNTER — Encounter (HOSPITAL_COMMUNITY): Payer: Self-pay

## 2017-09-23 DIAGNOSIS — Z87891 Personal history of nicotine dependence: Secondary | ICD-10-CM | POA: Insufficient documentation

## 2017-09-23 DIAGNOSIS — J45909 Unspecified asthma, uncomplicated: Secondary | ICD-10-CM | POA: Insufficient documentation

## 2017-09-23 DIAGNOSIS — R42 Dizziness and giddiness: Secondary | ICD-10-CM

## 2017-09-23 DIAGNOSIS — R112 Nausea with vomiting, unspecified: Secondary | ICD-10-CM

## 2017-09-23 DIAGNOSIS — I129 Hypertensive chronic kidney disease with stage 1 through stage 4 chronic kidney disease, or unspecified chronic kidney disease: Secondary | ICD-10-CM | POA: Insufficient documentation

## 2017-09-23 DIAGNOSIS — N189 Chronic kidney disease, unspecified: Secondary | ICD-10-CM | POA: Insufficient documentation

## 2017-09-23 DIAGNOSIS — Z79899 Other long term (current) drug therapy: Secondary | ICD-10-CM | POA: Insufficient documentation

## 2017-09-23 DIAGNOSIS — G43909 Migraine, unspecified, not intractable, without status migrainosus: Secondary | ICD-10-CM

## 2017-09-23 MED ORDER — METOCLOPRAMIDE HCL 10 MG PO TABS: 10.0000 mg | ORAL_TABLET | Freq: Four times a day (QID) | ORAL | 0 refills | Status: DC | PRN

## 2017-09-23 MED ORDER — MORPHINE SULFATE (PF) 4 MG/ML IV SOLN
4.0000 mg | Freq: Once | INTRAVENOUS | Status: AC
  Administered 2017-09-23: 4 mg via INTRAVENOUS
  Filled 2017-09-23: qty 1

## 2017-09-23 MED ORDER — KETOROLAC TROMETHAMINE 30 MG/ML IJ SOLN
15.0000 mg | Freq: Once | INTRAMUSCULAR | Status: AC
Start: 2017-09-23 — End: 2017-09-23
  Administered 2017-09-23: 15 mg via INTRAVENOUS
  Filled 2017-09-23: qty 1

## 2017-09-23 MED ORDER — DIPHENHYDRAMINE HCL 50 MG/ML IJ SOLN
25.0000 mg | Freq: Once | INTRAMUSCULAR | Status: AC
Start: 2017-09-23 — End: 2017-09-23
  Administered 2017-09-23: 25 mg via INTRAVENOUS
  Filled 2017-09-23: qty 1

## 2017-09-23 MED ORDER — MAGNESIUM SULFATE IN D5W 1-5 GM/100ML-% IV SOLN
1.0000 g | Freq: Once | INTRAVENOUS | Status: AC
  Administered 2017-09-23: 1 g via INTRAVENOUS
  Filled 2017-09-23: qty 100

## 2017-09-23 MED ORDER — RIZATRIPTAN BENZOATE 10 MG PO TABS: 10.0000 mg | ORAL_TABLET | ORAL | 0 refills | Status: DC | PRN

## 2017-09-23 MED ORDER — ACETAMINOPHEN 325 MG PO TABS
650.0000 mg | ORAL_TABLET | Freq: Once | ORAL | Status: AC
  Administered 2017-09-23: 650 mg via ORAL
  Filled 2017-09-23: qty 2

## 2017-09-23 MED ORDER — SODIUM CHLORIDE 0.9 % IV BOLUS (SEPSIS)
1000.0000 mL | Freq: Once | INTRAVENOUS | Status: AC
  Administered 2017-09-23: 1000 mL via INTRAVENOUS

## 2017-09-23 MED ORDER — DEXAMETHASONE SODIUM PHOSPHATE 10 MG/ML IJ SOLN
10.0000 mg | Freq: Once | INTRAMUSCULAR | Status: AC
Start: 2017-09-23 — End: 2017-09-23
  Administered 2017-09-23: 10 mg via INTRAVENOUS
  Filled 2017-09-23: qty 1

## 2017-09-23 MED ORDER — METOCLOPRAMIDE HCL 5 MG/ML IJ SOLN
10.0000 mg | Freq: Once | INTRAMUSCULAR | Status: AC
Start: 2017-09-23 — End: 2017-09-23
  Administered 2017-09-23: 10 mg via INTRAVENOUS
  Filled 2017-09-23: qty 2

## 2017-09-23 NOTE — ED Triage Notes (Addendum)
Patient c/o migraine x 1 week, intermittent N/V. Patient states she has taken all of her migraine medication that was prescribed and went to PCP who prescribed Tylenol which does not help. Patient is sensitive to light and sound.  Patient states she was here yesterday, but left due to long wait times.

## 2017-09-23 NOTE — ED Notes (Signed)
Gave Pt graham crackers in lobby after okay from triage nurse, Pt stated she was no longer feeling nauseous.

## 2017-09-23 NOTE — ED Provider Notes (Signed)
[facility] DEPT Provider Note   CSN: [id number] Arrival date & time: 09/23/17  0808     History   Chief Complaint Chief Complaint  Patient presents with  . Migraine  . Nausea    HPI Victoria Moore is a 41 y.o. female with a PMHx of migraines, HTN, GERD, anemia, asthma, sarcoidosis, peripheral neuropathy, and epilepsy, who presents to the ED with complaints of gradual onset constant migraine headache 1 week. Patient states this is somewhat similar to her prior migraines aside from the fact that it feels like a pressure in her head, and has not gone away with her usual medicines. She describes the pain as 10/10 constant pressure-like pain generally over her entire head, nonradiating, worse with activity and bright lights, and unrelieved with topamax, rizatriptan, tylenol #4, excedrin, and extra strength tylenol. She ran out of her rizatriptan yesterday after taking her last dose. She has had associated intermittent nausea (currently resolved), vomiting (vomited 2 times last night, nonbloody and nonbilious, no emesis today), and intermittent lightheadedness which comes and goes at random with no eliciting factor. She denies any recent head injury. Denies any recent seizures, she is no longer on Dilantin due to intolerance (Dr. Jannifer Franklin her neurologist took her off it during 02/21/17 visit), only takes Topamax for her seizures. Her PCP is Jonathon Jordan. She was seen in the ED 4 times in February 2018, 2 times for headache (after seizure which was what she was seen for the first of the visits), and was given the migraine cocktail on one of those visits which she thinks helped. Denies hx of DM2.  She denies any vision changes, vertiginous symptoms, cough, URI symptoms, nasal congestion, fevers, chills, CP, SOB, abdominal pain, hematemesis, diarrhea, constipation, melena, hematochezia, dysuria, hematuria, numbness, tingling, focal weakness, or any other complaints at this time.   The history is  provided by the patient and medical records. No language interpreter was used.  Migraine  This is a recurrent problem. The current episode started more than 2 days ago. The problem occurs constantly. The problem has not changed since onset.Associated symptoms include headaches. Pertinent negatives include no chest pain, no abdominal pain and no shortness of breath. Exacerbated by: activity and bright lights. Nothing relieves the symptoms. Treatments tried: topamax, rizatriptan, tylenol #4, excedrin, and extra strength tylenol. The treatment provided no relief.    Past Medical History:  Diagnosis Date  . Allergy   . Anemia   . Anxiety   . Arthritis   . Asthma   . Blood transfusion without reported diagnosis   . Chronic kidney disease   . Common migraine with intractable migraine 10/07/2016  . Depression   . GERD (gastroesophageal reflux disease)   . Hypertension   . Migraine   . Sarcoidosis   . Seizures ([place])    last one year ago    Patient Active Problem List   Diagnosis Date Noted  . Essential hypertension 11/15/2016  . 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 Morbid (severe) obesity due to excess calories ([place]) 10/23/2016  . Cough variant asthma vs UACS  10/23/2016  . Common migraine with intractable migraine 10/07/2016  . Dyspnea 04/15/2016  . Hilar adenopathy 04/15/2016  . Convulsions/seizures ([place]) 03/11/2016  . Fibroids 03/11/2016  . Anemia 11/12/2014  . Symptomatic anemia 11/12/2014  . Menorrhagia 11/12/2014  . Iron deficiency 11/12/2014  . Right-sided chest pain 11/12/2014    Past Surgical History:  Procedure Laterality Date  . ABDOMINAL HYSTERECTOMY    . LAPAROSCOPIC OVARIAN  CYSTECTOMY Left 03/11/2016   Procedure: LAPAROSCOPIC OVARIAN CYSTECTOMY;  Surgeon: Eldred Manges, MD;  Location: [facility];  Service: Gynecology;  Laterality: Left;  . LAPAROSCOPIC VAGINAL HYSTERECTOMY WITH SALPINGECTOMY Bilateral 03/11/2016   Procedure: LAPAROSCOPIC ASSISTED VAGINAL  HYSTERECTOMY WITH SALPINGECTOMY;  Surgeon: Eldred Manges, MD;  Location: [facility];  Service: Gynecology;  Laterality: Bilateral;  . TUBAL LIGATION      OB History    Gravida Para Term Preterm AB Living   1             SAB TAB Ectopic Multiple Live Births                   Home Medications    Prior to Admission medications   Medication Sig Start Date End Date Taking? Authorizing Provider  acetaminophen-codeine (TYLENOL #4) 300-60 MG tablet Take 1 tablet by mouth every 6 (six) hours as needed for pain.     [provider]  ALPRAZolam Duanne Moron) 1 MG tablet Take 1 mg by mouth daily as needed for anxiety.    [provider]  amLODipine (NORVASC) 2.5 MG tablet Take 2.5 mg by mouth daily.     [provider]  budesonide-formoterol (SYMBICORT) 80-4.5 MCG/ACT inhaler Inhale 2 puffs into the lungs 2 (two) times daily. 05/06/17   Tanda Rockers, MD  busPIRone (BUSPAR) 7.5 MG tablet Take 7.5 mg by mouth 2 (two) times daily.    [provider]  cyclobenzaprine (FLEXERIL) 10 MG tablet Take 10-20 mg by mouth at bedtime as needed for muscle spasms.    [provider]  DEXILANT 60 MG capsule Take 60 mg by mouth daily.    [provider]  LYRICA 75 MG capsule Take 75 mg by mouth 2 (two) times daily.    [provider]  mometasone-formoterol (DULERA) 100-5 MCG/ACT AERO Inhale 2 puffs into the lungs 2 (two) times daily. 02/14/17   Tanda Rockers, MD  mometasone-formoterol (DULERA) 100-5 MCG/ACT AERO Inhale 2 puffs into the lungs 2 (two) times daily. 06/24/17 07/01/17  Tanda Rockers, MD  oxyCODONE-acetaminophen (PERCOCET/ROXICET) 5-325 MG tablet Take 2 tablets by mouth every 4 (four) hours as needed for severe pain. 02/13/17   Lacretia Leigh, MD  phenytoin (DILANTIN) 100 MG ER capsule Take 3 capsules (300 mg total) by mouth at bedtime. 02/12/17   Varney Biles, MD  potassium chloride SA (K-DUR,KLOR-CON) 20 MEQ tablet Take 1 tablet (20 mEq total)  by mouth 2 (two) times daily. 02/15/17 03/01/17  Fatima Blank, MD  rizatriptan (MAXALT) 10 MG tablet Take 10 mg by mouth every 2 (two) hours as needed for migraine.    [provider]  topiramate (TOPAMAX) 25 MG tablet Take 2 tablets (50 mg total) by mouth 2 (two) times daily. 02/21/17   Kathrynn Ducking, MD  traZODone (DESYREL) 100 MG tablet Take 1 tablet (100 mg total) by mouth at bedtime. 02/28/17   Kathrynn Ducking, MD    Family History Family History  Problem Relation Age of Onset  . Adopted: Yes  . Other Son        Growing pains  . Asthma Mother   . Heart Problems Mother   . Migraines Mother   . Diabetes Father   . Peptic Ulcer Father   . Heart Problems Father     Social History Social History  Substance Use Topics  . Smoking status: Former Smoker    Packs/day: 0.25    Years: 17.00  Types: Cigarettes    Quit date: 03/02/2013  . Smokeless tobacco: Never Used  . Alcohol use No     Comment: only on special occasions     Allergies   Aspirin   Review of Systems Review of Systems  Constitutional: Negative for chills and fever.  HENT: Negative for congestion and rhinorrhea.   Eyes: Positive for photophobia. Negative for visual disturbance.  Respiratory: Negative for cough and shortness of breath.   Cardiovascular: Negative for chest pain.  Gastrointestinal: Positive for nausea and vomiting (none since yesterday). Negative for abdominal pain, blood in stool, constipation and diarrhea.  Genitourinary: Negative for dysuria and hematuria.  Musculoskeletal: Negative for arthralgias and myalgias.  Skin: Negative for color change.  Allergic/Immunologic: Negative for immunocompromised state.  Neurological: Positive for light-headedness (intermittent) and headaches. Negative for dizziness, weakness and numbness.  Psychiatric/Behavioral: Negative for confusion.   All other systems reviewed and are negative for acute change except as noted in the HPI.     Physical Exam Updated Vital Signs BP (!) 122/91 (BP Location: Right Arm)   Pulse (!) 58   Temp 98.6 F (37 C) (Oral)   Resp 18   Ht 6' (1.829 m)   Wt 133.4 kg (294 lb)   LMP 02/19/2016   SpO2 100%   BMI 39.87 kg/m   Physical Exam  Constitutional: She is oriented to person, place, and time. Vital signs are normal. She appears well-developed and well-nourished.  Non-toxic appearance. No distress.  Afebrile, nontoxic, NAD, initially has sweater hood pulled over head, but removes this for evaluation  HENT:  Head: Normocephalic and atraumatic.  Mouth/Throat: Oropharynx is clear and moist and mucous membranes are normal.  Eyes: Pupils are equal, round, and reactive to light. Conjunctivae and EOM are normal. Right eye exhibits no discharge. Left eye exhibits no discharge.  PERRL, EOMI, no nystagmus, no visual field deficits   Neck: Normal range of motion. Neck supple. No spinous process tenderness and no muscular tenderness present. No neck rigidity. Normal range of motion present.  FROM intact without spinous process TTP, no bony stepoffs or deformities, no paraspinous muscle TTP or muscle spasms. No rigidity or meningeal signs. No bruising or swelling.   Cardiovascular: Normal rate, regular rhythm, normal heart sounds and intact distal pulses.  Exam reveals no gallop and no friction rub.   No murmur heard. Pulmonary/Chest: Effort normal and breath sounds normal. No respiratory distress. She has no decreased breath sounds. She has no wheezes. She has no rhonchi. She has no rales.  Abdominal: Soft. Normal appearance and bowel sounds are normal. She exhibits no distension. There is no tenderness. There is no rigidity, no rebound, no guarding, no CVA tenderness, no tenderness at McBurney's point and negative Murphy's sign.  Musculoskeletal: Normal range of motion.  MAE x4 Strength and sensation grossly intact in all extremities Distal pulses intact Gait steady  Neurological: She is alert  and oriented to person, place, and time. She has normal strength. No cranial nerve deficit or sensory deficit. Coordination and gait normal. GCS eye subscore is 4. GCS verbal subscore is 5. GCS motor subscore is 6.  CN 2-12 grossly intact A&O x4 GCS 15 Sensation and strength intact Gait nonataxic including with tandem walking Coordination with finger-to-nose WNL Neg pronator drift   Skin: Skin is warm, dry and intact. No rash noted.  Psychiatric: She has a normal mood and affect.  Nursing note and vitals reviewed.    ED Treatments / Results  Labs (all labs  ordered are listed, but only abnormal results are displayed) Labs Reviewed - No data to display  EKG  EKG Interpretation None       Radiology No results found.  Procedures Procedures (including critical care time)  Medications Ordered in ED Medications  metoCLOPramide (REGLAN) injection 10 mg (10 mg Intravenous Given 09/23/17 1855)    And  diphenhydrAMINE (BENADRYL) injection 25 mg (25 mg Intravenous Given 09/23/17 1854)    And  sodium chloride 0.9 % bolus 1,000 mL (0 mLs Intravenous Stopped 09/23/17 2050)  dexamethasone (DECADRON) injection 10 mg (10 mg Intravenous Given 09/23/17 1855)  morphine 4 MG/ML injection 4 mg (4 mg Intravenous Given 09/23/17 1855)  ketorolac (TORADOL) 30 MG/ML injection 15 mg (15 mg Intravenous Given 09/23/17 2124)  magnesium sulfate IVPB 1 g 100 mL (1 g Intravenous New Bag/Given 09/23/17 2130)  acetaminophen (TYLENOL) tablet 650 mg (650 mg Oral Given 09/23/17 2125)     Initial Impression / Assessment and Plan / ED Course  I have reviewed the triage vital signs and the nursing notes.  Pertinent labs & imaging results that were available during my care of the patient were reviewed by me and considered in my medical decision making (see chart for details).     41 y.o. female here with migraine x1 wk with intermittent N/V and intermittent lightheadedness. On exam, no focal neuro deficits,  ambulatory with steady gait, in NAD. Will give migraine cocktail with morphine instead of toradol since pt has anaphylactic allergy to NSAID, and use decadron as well to help with rebound headache. Doubt need for emergent work up/imaging, pt had head CT in 01/2017 which was negative, and has no concerning s/sx today that would require further work up at this time. Will reassess after migraine cocktail.   9:08 PM Pt states initially felt better, but still having headache; will give tylenol and Magnesium, clarified with pt that she can get toradol (has had it before without issue, able to take other NSAIDs aside from ASA) so will give this as well. Will reassess shortly.  10:29 PM  Pt feeling better, migraine starting to improve and diminish. Likely migraine related to medication overuse; pt could potentially benefit from prophylactic migraine meds since she seems to have quite a few headaches per month. Advised continuation of her usual home medications, will provide refill of rizatriptan since she ran out yesterday; will rx reglan to help with migraines as well as n/v, pt tolerating PO well here; discussed adequate hydration and rest, other OTC remedies for symptomatic relief, and f/up with her neurologist in the next 5-7 days for recheck and ongoing management of her migraines. I explained the diagnosis and have given explicit precautions to return to the ER including for any other new or worsening symptoms. The patient understands and accepts the medical plan as it's been dictated and I have answered their questions. Discharge instructions concerning home care and prescriptions have been given. The patient is STABLE and is discharged to home in good condition.    Final Clinical Impressions(s) / ED Diagnoses   Final diagnoses:  Migraine without status migrainosus, not intractable, unspecified migraine type  Nausea and vomiting in adult patient  Intermittent lightheadedness    New  Prescriptions New Prescriptions   METOCLOPRAMIDE (REGLAN) 10 MG TABLET    Take 1 tablet (10 mg total) by mouth every 6 (six) hours as needed for nausea (nausea/headache).   RIZATRIPTAN (MAXALT) 10 MG TABLET    Take 1 tablet (10 mg total) by  mouth every 2 (two) hours as needed for migraine. May repeat in 2 hours if needed. MAX 30mg  (3 tablets) [address] [place], [place] 09/23/17 2229    Fatima Blank, MD 09/23/17 [phone]

## 2017-09-23 NOTE — Discharge Instructions (Signed)
Alternate between tylenol and motrin as needed for pain. Use reglan as needed for migraine headaches or nausea. Use your usual home medications as directed by your doctor. Stay well hydrated. Get plenty of rest. Follow up with your neurologist in 5-7 days for recheck of symptoms and ongoing management of your migraine headaches. Return to the ER for changes or worsening symptoms.

## 2017-11-11 ENCOUNTER — Ambulatory Visit (HOSPITAL_COMMUNITY)
Admission: EM | Admit: 2017-11-11 | Discharge: 2017-11-11 | Disposition: A | Discharge status: Home / Self Care | Payer: Self-pay | Attending: Internal Medicine | Admitting: Internal Medicine

## 2017-11-11 ENCOUNTER — Encounter (HOSPITAL_COMMUNITY): Payer: Self-pay | Admitting: Family Medicine

## 2017-11-11 DIAGNOSIS — G43809 Other migraine, not intractable, without status migrainosus: Secondary | ICD-10-CM

## 2017-11-11 DIAGNOSIS — I10 Essential (primary) hypertension: Secondary | ICD-10-CM

## 2017-11-11 DIAGNOSIS — R110 Nausea: Secondary | ICD-10-CM

## 2017-11-11 MED ORDER — ONDANSETRON 4 MG PO TBDP
ORAL_TABLET | ORAL | Status: AC
  Filled 2017-11-11: qty 1

## 2017-11-11 MED ORDER — KETOROLAC TROMETHAMINE 60 MG/2ML IM SOLN
INTRAMUSCULAR | Status: AC
  Filled 2017-11-11: qty 2

## 2017-11-11 MED ORDER — METHYLPREDNISOLONE SODIUM SUCC 125 MG IJ SOLR: 80.0000 mg | Freq: Once | INTRAMUSCULAR | Status: DC

## 2017-11-11 MED ORDER — ONDANSETRON 4 MG PO TBDP
4.0000 mg | ORAL_TABLET | Freq: Once | ORAL | Status: AC
  Administered 2017-11-11: 4 mg via ORAL

## 2017-11-11 MED ORDER — KETOROLAC TROMETHAMINE 60 MG/2ML IM SOLN
60.0000 mg | Freq: Once | INTRAMUSCULAR | Status: AC
  Administered 2017-11-11: 60 mg via INTRAMUSCULAR

## 2017-11-11 NOTE — ED Provider Notes (Signed)
[facility]    CSN: [id number] Arrival date & time: 11/11/17  1016     History   Chief Complaint Chief Complaint  Patient presents with  . Hypertension  . Headache    HPI Victoria Moore is a 41 y.o. female.   Victoria Moore presents today with complaints of headache and concern of her blood pressure. She has a history of htn as well as migraine headaches. She states she has had a headache and sensation of sluggishness for the past few days. Mild nausea, took reglan yesterday which minimally helped. Takes daily topamax for headaches. Has not taken any other PRN medications for headache. States she took her BP this am and it was 159/100. Did take her amlodipine, noted bp of 136/96 here in clinic. She feels she is anxious. States her headache feels similar to other migraines she has had. It is a pressure, rates it 10/10. Movement makes it worse. Denies light or sound sensitivity. She states she recently lost her insurance so will not see her PCP until January of 2019. She has mild dizziness. Denies chest pain or shortness of breath. No head injuries.    ROS per HPI.       Past Medical History:  Diagnosis Date  . Allergy   . Anemia   . Anxiety   . Arthritis   . Asthma   . Blood transfusion without reported diagnosis   . Chronic kidney disease   . Common migraine with intractable migraine 10/07/2016  . Depression   . GERD (gastroesophageal reflux disease)   . Hypertension   . Migraine   . Sarcoidosis   . Seizures ([place])    last one year ago    Patient Active Problem List   Diagnosis Date Noted  . Essential hypertension 11/15/2016  . 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 Morbid (severe) obesity due to excess calories ([place]) 10/23/2016  . Cough variant asthma vs UACS  10/23/2016  . Common migraine with intractable migraine 10/07/2016  . Dyspnea 04/15/2016  . Hilar adenopathy 04/15/2016  . Convulsions/seizures ([place]) 03/11/2016  . Fibroids 03/11/2016  . Anemia  11/12/2014  . Symptomatic anemia 11/12/2014  . Menorrhagia 11/12/2014  . Iron deficiency 11/12/2014  . Right-sided chest pain 11/12/2014    Past Surgical History:  Procedure Laterality Date  . ABDOMINAL HYSTERECTOMY    . LAPAROSCOPIC ASSISTED VAGINAL HYSTERECTOMY WITH SALPINGECTOMY Bilateral 03/11/2016   Performed by Eldred Manges[facility] at [facility]  . LAPAROSCOPIC OVARIAN CYSTECTOMY Left 03/11/2016   Performed by Eldred Manges, MD at [facility]  . TUBAL LIGATION      OB History    Gravida Para Term Preterm AB Living   1             SAB TAB Ectopic Multiple Live Births                   Home Medications    Prior to Admission medications   Medication Sig Start Date End Date Taking? Authorizing Provider  acetaminophen-codeine (TYLENOL #4) 300-60 MG tablet Take 1 tablet by mouth every 6 (six) hours as needed for pain.     [provider]  ALPRAZolam Duanne Moron) 1 MG tablet Take 1 mg by mouth daily as needed for anxiety.    [provider]  amLODipine (NORVASC) 2.5 MG tablet Take 2.5 mg by mouth daily.     [provider]  budesonide-formoterol (SYMBICORT) 80-4.5 MCG/ACT inhaler Inhale 2 puffs into the lungs 2 (  two) times daily. 05/06/17   Tanda Rockers, MD  busPIRone (BUSPAR) 7.5 MG tablet Take 7.5 mg by mouth 2 (two) times daily.    [provider]  cyclobenzaprine (FLEXERIL) 10 MG tablet Take 10-20 mg by mouth at bedtime as needed for muscle spasms.    [provider]  DEXILANT 60 MG capsule Take 60 mg by mouth daily.    [provider]  LYRICA 75 MG capsule Take 75 mg by mouth 2 (two) times daily.    [provider]  meloxicam (MOBIC) 15 MG tablet Take 15 mg by mouth daily.    [provider]  metoCLOPramide (REGLAN) 10 MG tablet Take 1 tablet (10 mg total) by mouth every 6 (six) hours as needed for nausea (nausea/headache). 09/23/17   Street, Mercedes, PA-C  mometasone-formoterol (DULERA) 100-5 MCG/ACT AERO  Inhale 2 puffs into the lungs 2 (two) times daily. 02/14/17   Tanda Rockers, MD  oxyCODONE-acetaminophen (PERCOCET/ROXICET) 5-325 MG tablet Take 2 tablets by mouth every 4 (four) hours as needed for severe pain. Patient not taking: Reported on 09/23/2017 02/13/17   Lacretia Leigh, MD  phenytoin (DILANTIN) 100 MG ER capsule Take 3 capsules (300 mg total) by mouth at bedtime. Patient not taking: Reported on 09/23/2017 02/12/17   Varney Biles, MD  rizatriptan (MAXALT) 10 MG tablet Take 10 mg by mouth every 2 (two) hours as needed for migraine.    [provider]  rizatriptan (MAXALT) 10 MG tablet Take 1 tablet (10 mg total) by mouth every 2 (two) hours as needed for migraine. May repeat in 2 hours if needed. MAX 30mg  (3 tablets) PER DAY 09/23/17   Street, [place], PA-C  topiramate (TOPAMAX) 25 MG tablet Take 2 tablets (50 mg total) by mouth 2 (two) times daily. 02/21/17   Kathrynn Ducking, MD  traZODone (DESYREL) 100 MG tablet Take 1 tablet (100 mg total) by mouth at bedtime. 02/28/17   Kathrynn Ducking, MD    Family History Family History  Adopted: Yes  Problem Relation Age of Onset  . Other Son        Growing pains  . Asthma Mother   . Heart Problems Mother   . Migraines Mother   . Diabetes Father   . Peptic Ulcer Father   . Heart Problems Father     Social History Social History   Tobacco Use  . Smoking status: Former Smoker    Packs/day: 0.25    Years: 17.00    Pack years: 4.25    Types: Cigarettes    Last attempt to quit: 03/02/2013    Years since quitting: 4.6  . Smokeless tobacco: Never Used  Substance Use Topics  . Alcohol use: No    Alcohol/week: 0.0 oz    Comment: only on special occasions  . Drug use: No    Comment: former - 6 yrs ago     Allergies   Aspirin   Review of Systems Review of Systems   Physical Exam Triage Vital Signs ED Triage Vitals  Enc Vitals Group     BP 11/11/17 1110 (!) 134/96     Pulse Rate 11/11/17 1110 85     Resp  11/11/17 1110 18     Temp 11/11/17 1110 98 F (36.7 C)     Temp src --      SpO2 11/11/17 1110 100 %     Weight --      Height --      Head  Circumference --      Peak Flow --      Pain Score 11/11/17 1107 10     Pain Loc --      Pain Edu? --      Excl. in [place]? --    No data found.  Updated Vital Signs BP (!) 134/96   Pulse 85   Temp 98 F (36.7 C)   Resp 18   LMP 02/19/2016   SpO2 100%   Visual Acuity Right Eye Distance:   Left Eye Distance:   Bilateral Distance:    Right Eye Near:   Left Eye Near:    Bilateral Near:     Physical Exam  Constitutional: She is oriented to person, place, and time. She appears well-developed and well-nourished. No distress.  HENT:  Head: Normocephalic and atraumatic.  Eyes: EOM are normal. Pupils are equal, round, and reactive to light.  Neck: Normal range of motion.  Cardiovascular: Normal rate, regular rhythm and normal heart sounds.  Pulmonary/Chest: Effort normal and breath sounds normal.  Neurological: She is alert and oriented to person, place, and time. She has normal strength. No cranial nerve deficit or sensory deficit. She displays a negative Romberg sign.  Skin: Skin is warm and dry.  Psychiatric: She has a normal mood and affect.  Vitals reviewed.    UC Treatments / Results  Labs (all labs ordered are listed, but only abnormal results are displayed) Labs Reviewed - No data to display  EKG  EKG Interpretation None       Radiology No results found.  Procedures Procedures (including critical care time)  Medications Ordered in UC Medications  ketorolac (TORADOL) injection 60 mg (60 mg Intramuscular Given 11/11/17 1208)  ondansetron (ZOFRAN-ODT) disintegrating tablet 4 mg (4 mg Oral Given 11/11/17 1208)     Initial Impression / Assessment and Plan / UC Course  I have reviewed the triage vital signs and the nursing notes.  Pertinent labs & imaging results that were available during my care of the patient  were reviewed by me and considered in my medical decision making (see chart for details).  Clinical Course as of Nov 11 1305  Fri Nov 11, 2017  1300 Rates pain 8/10, states feels improved  [NB]  1301 BP 111/74  [NB]    Clinical Course User Index [NB] Augusto Gamble B, NP    Pain, BP and nausea improved with toradol and zofran today in clinic. Discussed with patient bp causing headache or headache causing increased BP. Recommended establish with community [facility] wellness due to lapse in insurance, for continued management of these chronic issues. Return precautions provided.If symptoms worsen or do not improve in the next week to return to be seen or to follow up with PCP. Patient verbalized understanding and agreeable to plan.    Final Clinical Impressions(s) / UC Diagnoses   Final diagnoses:  Other migraine without status migrainosus, not intractable  Hypertension, unspecified type    ED Discharge Orders    None       Controlled Substance Prescriptions [place] Controlled Substance Registry consulted? Not Applicable   Zigmund Gottron, NP 11/11/17 [phone]

## 2017-11-11 NOTE — Discharge Instructions (Signed)
Continue to monitor blood pressure, take BP medications daily as prescribed. Migraine medications as needed for symptoms as prescribed. May want to follow with [facility] for continued management. If symptoms worsen or do not improve in the next week to return to be seen or to follow up with PCP.

## 2017-11-11 NOTE — ED Triage Notes (Signed)
Pt here for dizziness and headache related to her HTN. Reports x 3 days. Hx of HTN and hasn't taken BP meds in a while. Reports also a lot of anxiety. BP 150/100 at home.

## 2017-11-16 ENCOUNTER — Encounter (HOSPITAL_COMMUNITY): Payer: Self-pay | Admitting: Emergency Medicine

## 2017-11-16 ENCOUNTER — Emergency Department (HOSPITAL_COMMUNITY)
Admission: EM | Admit: 2017-11-16 | Discharge: 2017-11-16 | Disposition: A | Discharge status: Home / Self Care | Payer: Self-pay | Attending: Emergency Medicine | Admitting: Emergency Medicine

## 2017-11-16 ENCOUNTER — Other Ambulatory Visit: Payer: Self-pay

## 2017-11-16 ENCOUNTER — Telehealth: Payer: Self-pay | Admitting: Internal Medicine

## 2017-11-16 DIAGNOSIS — I129 Hypertensive chronic kidney disease with stage 1 through stage 4 chronic kidney disease, or unspecified chronic kidney disease: Secondary | ICD-10-CM | POA: Insufficient documentation

## 2017-11-16 DIAGNOSIS — G43009 Migraine without aura, not intractable, without status migrainosus: Secondary | ICD-10-CM

## 2017-11-16 DIAGNOSIS — R42 Dizziness and giddiness: Secondary | ICD-10-CM

## 2017-11-16 DIAGNOSIS — Z87891 Personal history of nicotine dependence: Secondary | ICD-10-CM | POA: Insufficient documentation

## 2017-11-16 DIAGNOSIS — Z79899 Other long term (current) drug therapy: Secondary | ICD-10-CM | POA: Insufficient documentation

## 2017-11-16 DIAGNOSIS — J45909 Unspecified asthma, uncomplicated: Secondary | ICD-10-CM | POA: Insufficient documentation

## 2017-11-16 DIAGNOSIS — N189 Chronic kidney disease, unspecified: Secondary | ICD-10-CM | POA: Insufficient documentation

## 2017-11-16 MED ORDER — PROMETHAZINE HCL 25 MG PO TABS: 25.0000 mg | ORAL_TABLET | Freq: Four times a day (QID) | ORAL | 0 refills | Status: DC | PRN

## 2017-11-16 MED ORDER — DIPHENHYDRAMINE HCL 50 MG/ML IJ SOLN
25.0000 mg | Freq: Once | INTRAMUSCULAR | Status: AC
  Administered 2017-11-16: 25 mg via INTRAVENOUS
  Filled 2017-11-16: qty 1

## 2017-11-16 MED ORDER — PROCHLORPERAZINE EDISYLATE 5 MG/ML IJ SOLN
10.0000 mg | Freq: Once | INTRAMUSCULAR | Status: AC
  Administered 2017-11-16: 10 mg via INTRAVENOUS
  Filled 2017-11-16: qty 2

## 2017-11-16 MED ORDER — DEXAMETHASONE SODIUM PHOSPHATE 10 MG/ML IJ SOLN
10.0000 mg | Freq: Once | INTRAMUSCULAR | Status: AC
  Administered 2017-11-16: 10 mg via INTRAVENOUS
  Filled 2017-11-16: qty 1

## 2017-11-16 MED ORDER — BUTALBITAL-APAP-CAFFEINE 50-325-40 MG PO TABS: 1.0000 | ORAL_TABLET | Freq: Four times a day (QID) | ORAL | 0 refills | Status: DC | PRN

## 2017-11-16 MED ORDER — KETOROLAC TROMETHAMINE 30 MG/ML IJ SOLN
30.0000 mg | Freq: Once | INTRAMUSCULAR | Status: AC
  Administered 2017-11-16: 30 mg via INTRAVENOUS
  Filled 2017-11-16: qty 1

## 2017-11-16 MED ORDER — MAGNESIUM SULFATE IN D5W 1-5 GM/100ML-% IV SOLN
1.0000 g | Freq: Once | INTRAVENOUS | Status: AC
  Administered 2017-11-16: 1 g via INTRAVENOUS
  Filled 2017-11-16: qty 100

## 2017-11-16 MED ORDER — BUDESONIDE-FORMOTEROL FUMARATE 80-4.5 MCG/ACT IN AERO: 2.0000 | INHALATION_SPRAY | Freq: Two times a day (BID) | RESPIRATORY_TRACT | 0 refills | Status: DC

## 2017-11-16 MED ORDER — METOCLOPRAMIDE HCL 5 MG/ML IJ SOLN
10.0000 mg | Freq: Once | INTRAMUSCULAR | Status: AC
  Administered 2017-11-16: 10 mg via INTRAVENOUS
  Filled 2017-11-16: qty 2

## 2017-11-16 MED ORDER — SODIUM CHLORIDE 0.9 % IV BOLUS (SEPSIS)
1000.0000 mL | Freq: Once | INTRAVENOUS | Status: AC
  Administered 2017-11-16: 1000 mL via INTRAVENOUS

## 2017-11-16 NOTE — Discharge Instructions (Addendum)
To find a primary care or specialty doctor please call [phone] or [phone] to access "[facility] Find a Doctor Service." ° °You may also go on the [facility] website at [email or web address] ° °There are also multiple [facility], [facility] and [facility] practices throughout the [place] that are frequently accepting new patients. You may find a clinic that is close to your home and contact them. ° °[facility] -  °[address] °[place] [address] °[phone] ° ° °[facility] -  °[address] °[place] [address] °[phone] ° ° °[facility] - °[address]  °[place] [address] °[phone] ° ° °

## 2017-11-16 NOTE — Telephone Encounter (Signed)
Spoke with pt, advised message from [facility]. I will leave samples of Symbicort 80. She made an appt on Jan 3, this is when her insurance kicks in. Nothing further is needed.

## 2017-11-16 NOTE — ED Notes (Signed)
IV team at bedside 

## 2017-11-16 NOTE — ED Triage Notes (Addendum)
Patient here from home with complaints of hypertension and dizziness. Reports elevated BP reading at home. Headache 10/10. Blurred vision. Denies n/v. States that she took her BP meds this morning.

## 2017-11-16 NOTE — Telephone Encounter (Signed)
MW we have a sample for Dulera 200 but pt is on Dulera 100. Can she still receive sample and if so what directions?

## 2017-11-16 NOTE — Telephone Encounter (Signed)
We have not seen in over a year so needs symb 80 2bid and ov with me before runs out

## 2017-11-16 NOTE — ED Notes (Signed)
Patient a difficult stick. Two attempts unsuccessful.

## 2017-11-16 NOTE — ED Notes (Signed)
Attempt [place] IV x2 unsuccessful.

## 2017-11-16 NOTE — ED Provider Notes (Signed)
TIME SEEN: 2:13 AM  CHIEF COMPLAINT: Migraine, vertigo  HPI: Patient is a 41 year old female with history of hypertension, chronic kidney disease, sarcoidosis, migraines who presents to the emergency department with intermittent frontal throbbing headache for the past several days with vertigo.  Has had similar symptoms before and states this feels exactly like her migraines.  States the vertigo is also common for her.  Has had photophobia, phonophobia and nausea.  No vomiting.  No head injury.  Not on blood thinners.  No fever, neck pain or neck stiffness.  No numbness, tingling or focal weakness.  States that her blood pressure is elevated but she thinks this is because of pain.  No chest pain or shortness of breath.  ROS: See HPI Constitutional: no fever  Eyes: no drainage  ENT: no runny nose   Cardiovascular:  no chest pain  Resp: no SOB  GI: no vomiting GU: no dysuria Integumentary: no rash  Allergy: no hives  Musculoskeletal: no leg swelling  Neurological: no slurred speech ROS otherwise negative  PAST MEDICAL HISTORY/PAST SURGICAL HISTORY:  Past Medical History:  Diagnosis Date  . Allergy   . Anemia   . Anxiety   . Arthritis   . Asthma   . Blood transfusion without reported diagnosis   . Chronic kidney disease   . Common migraine with intractable migraine 10/07/2016  . Depression   . GERD (gastroesophageal reflux disease)   . Hypertension   . Migraine   . Sarcoidosis   . Seizures ([place])    last one year ago    MEDICATIONS:  Prior to Admission medications   Medication Sig Start Date End Date Taking? Authorizing Provider  acetaminophen-codeine (TYLENOL #4) 300-60 MG tablet Take 1 tablet by mouth every 6 (six) hours as needed for pain.     [provider]  ALPRAZolam Duanne Moron) 1 MG tablet Take 1 mg by mouth daily as needed for anxiety.    [provider]  amLODipine (NORVASC) 2.5 MG tablet Take 2.5 mg by mouth daily.     [provider]   budesonide-formoterol (SYMBICORT) 80-4.5 MCG/ACT inhaler Inhale 2 puffs into the lungs 2 (two) times daily. 05/06/17   Tanda Rockers, MD  busPIRone (BUSPAR) 7.5 MG tablet Take 7.5 mg by mouth 2 (two) times daily.    [provider]  cyclobenzaprine (FLEXERIL) 10 MG tablet Take 10-20 mg by mouth at bedtime as needed for muscle spasms.    [provider]  DEXILANT 60 MG capsule Take 60 mg by mouth daily.    [provider]  LYRICA 75 MG capsule Take 75 mg by mouth 2 (two) times daily.    [provider]  meloxicam (MOBIC) 15 MG tablet Take 15 mg by mouth daily.    [provider]  metoCLOPramide (REGLAN) 10 MG tablet Take 1 tablet (10 mg total) by mouth every 6 (six) hours as needed for nausea (nausea/headache). 09/23/17   Street, Mercedes, PA-C  mometasone-formoterol (DULERA) 100-5 MCG/ACT AERO Inhale 2 puffs into the lungs 2 (two) times daily. 02/14/17   Tanda Rockers, MD  oxyCODONE-acetaminophen (PERCOCET/ROXICET) 5-325 MG tablet Take 2 tablets by mouth every 4 (four) hours as needed for severe pain. Patient not taking: Reported on 09/23/2017 02/13/17   Lacretia Leigh, MD  phenytoin (DILANTIN) 100 MG ER capsule Take 3 capsules (300 mg total) by mouth at bedtime. Patient not taking: Reported on 09/23/2017 02/12/17   Varney Biles, MD  rizatriptan (MAXALT) 10 MG tablet Take 10  mg by mouth every 2 (two) hours as needed for migraine.    [provider]  rizatriptan (MAXALT) 10 MG tablet Take 1 tablet (10 mg total) by mouth every 2 (two) hours as needed for migraine. May repeat in 2 hours if needed. MAX 30mg  (3 tablets) PER DAY 09/23/17   Street, [place], PA-C  topiramate (TOPAMAX) 25 MG tablet Take 2 tablets (50 mg total) by mouth 2 (two) times daily. 02/21/17   Kathrynn Ducking, MD  traZODone (DESYREL) 100 MG tablet Take 1 tablet (100 mg total) by mouth at bedtime. 02/28/17   Kathrynn Ducking, MD    ALLERGIES:  Allergies  Allergen Reactions  .  Aspirin Anaphylaxis and Hives         SOCIAL HISTORY:  Social History   Tobacco Use  . Smoking status: Former Smoker    Packs/day: 0.25    Years: 17.00    Pack years: 4.25    Types: Cigarettes    Last attempt to quit: 03/02/2013    Years since quitting: 4.7  . Smokeless tobacco: Never Used  Substance Use Topics  . Alcohol use: No    Alcohol/week: 0.0 oz    Comment: only on special occasions    FAMILY HISTORY: Family History  Adopted: Yes  Problem Relation Age of Onset  . Other Son        Growing pains  . Asthma Mother   . Heart Problems Mother   . Migraines Mother   . Diabetes Father   . Peptic Ulcer Father   . Heart Problems Father     EXAM: BP (!) 147/113 (BP Location: Left Arm)   Pulse 73   Temp 98 F (36.7 C) (Oral)   Resp 18   LMP 02/19/2016   SpO2 100%  CONSTITUTIONAL: Alert and oriented and responds appropriately to questions.  Appears uncomfortable, resting quietly in a dark room HEAD: Normocephalic EYES: Conjunctivae clear, pupils appear equal, EOMI, positive for photophobia ENT: normal nose; moist mucous membranes NECK: Supple, no meningismus, no nuchal rigidity, no LAD  CARD: RRR; S1 and S2 appreciated; no murmurs, no clicks, no rubs, no gallops RESP: Normal chest excursion without splinting or tachypnea; breath sounds clear and equal bilaterally; no wheezes, no rhonchi, no rales, no hypoxia or respiratory distress, speaking full sentences ABD/GI: Normal bowel sounds; non-distended; soft, non-tender, no rebound, no guarding, no peritoneal signs, no hepatosplenomegaly BACK:  The back appears normal and is non-tender to palpation, there is no CVA tenderness EXT: Normal ROM in all joints; non-tender to palpation; no edema; normal capillary refill; no cyanosis, no calf tenderness or swelling    SKIN: Normal color for age and race; warm; no rash NEURO: Moves all extremities equally, normal sensation diffusely, cranial nerves II through XII intact, normal  speech PSYCH: The patient's mood and manner are appropriate. Grooming and personal hygiene are appropriate.  MEDICAL DECISION MAKING: Patient here with her typical migraine headache.  She has associated vertigo.  She is neurologically intact.  No fevers or meningismus.  Doubt stroke, intracranial hemorrhage, meningitis, encephalitis, cavernous sinus thrombosis.  We will treat symptomatically with IV fluids, Toradol, Reglan, Benadryl and Decadron.  I do not feel she needs emergent head imaging.  I suspect that her blood pressure will improve and her pain has improved.  ED PROGRESS: 5:00 AM  I was just informed that multiple nursing staff members have attempted to place peripheral IV without success.  Patient reports that normally she needs the IV team to  place an IV in her hand.   6:15 AM  PIV placed by IV team.  Patient received Toradol, Reglan, Benadryl, Decadron, a liter of IV fluids approximately an hour ago.  Headache is now a 7/10 and down from a 10/10.  We will give IV Compazine and another round of IV Benadryl as well as IV magnesium.  We discussed trying to avoid narcotics given literature shows that this can make migraine headaches harder to treat in the future and cause rebound headaches.  She is comfortable with this plan.   7:10 AM  Pt reports headache is now a 5/10 after second round of medications.  She states she feels comfortable with discharge home.  Have offered her further medication for further headache control but she states she is feeling much better and is fine with discharge.  Her blood pressure has improved.  This seems to be 1 of her major concerns.  We will give her outpatient PCP and neurology follow-up information.   At this time, I do not feel there is any life-threatening condition present. I have reviewed and discussed all results (EKG, imaging, lab, urine as appropriate) and exam findings with patient/family. I have reviewed nursing notes and appropriate previous  records.  I feel the patient is safe to be discharged home without further emergent workup and can continue workup as an outpatient as needed. Discussed usual and customary return precautions. Patient/family verbalize understanding and are comfortable with this plan.  Outpatient follow-up has been provided if needed. All questions have been answered.     Sanjit Mcmichael, Delice Bison, DO 11/16/17 [phone]

## 2017-11-23 NOTE — Progress Notes (Signed)
Patient ID: Victoria Moore, female   DOB: Sep 28, 1976, 41 y.o.   MRN: [id number]     Victoria Moore, is a 41 y.o. female  SWN:[id number]  [id number]  DOB - 06/05/1976  Subjective:  Chief Complaint and HPI: Victoria Moore is a 41 y.o. female here today to establish care and for a follow up visit After being seen in the ED 11/16/2017 for HA and vertigo.  Those have resolved.  Today, she presents needing RF of her anxiety and BP medications.  She says her BP at home at night is often 140-150/90.  She has frequent HA.  +lots of stress in her life.  Her PCP previously managed her anxiety/depression.  She also c/o fatigue.  H/o very heavy periods and having to have a hysterectomy because of that.    From ED note: HPI: Patient is a 41 year old female with history of hypertension, chronic kidney disease, sarcoidosis, migraines who presents to the emergency department with intermittent frontal throbbing headache for the past several days with vertigo.  Has had similar symptoms before and states this feels exactly like her migraines.  States the vertigo is also common for her.  Has had photophobia, phonophobia and nausea.  No vomiting.  No head injury.  Not on blood thinners.  No fever, neck pain or neck stiffness.  No numbness, tingling or focal weakness.  States that her blood pressure is elevated but she thinks this is because of pain.  No chest pain or shortness of breath.  ED/Hospital notes reviewed.    ROS:   Constitutional:  No f/c, No night sweats, No unexplained weight loss. EENT:  No vision changes, No blurry vision, No hearing changes. No mouth, throat, or ear problems.  Respiratory: No cough, No SOB Cardiac: No CP, no palpitations GI:  No abd pain, No N/V/D. GU: No Urinary s/sx Musculoskeletal: No joint pain Neuro: + headache, no dizziness, no motor weakness.  Skin: No rash Endocrine:  No polydipsia. No polyuria.  Psych: Denies SI/HI  Depression screen [facility] 2/9 11/24/2017 12/17/2016    Decreased Interest 2 0  Down, Depressed, Hopeless 3 0  PHQ - 2 Score 5 0  Altered sleeping 3 -  Tired, decreased energy 3 -  Change in appetite 0 -  Feeling bad or failure about yourself  0 -  Trouble concentrating 3 -  Moving slowly or fidgety/restless 0 -  Suicidal thoughts 0 -  PHQ-9 Score 14 -   GAD 7 : Generalized Anxiety Score 11/24/2017  Nervous, Anxious, on Edge 3  Control/stop worrying 3  Worry too much - different things 3  Trouble relaxing 3  Restless 3  Easily annoyed or irritable 3  Afraid - awful might happen 0  Total GAD 7 Score 18      No problems updated.  ALLERGIES: Allergies  Allergen Reactions  . Aspirin Anaphylaxis and Hives         PAST MEDICAL HISTORY: Past Medical History:  Diagnosis Date  . Allergy   . Anemia   . Anxiety   . Arthritis   . Asthma   . Blood transfusion without reported diagnosis   . Chronic kidney disease   . Common migraine with intractable migraine 10/07/2016  . Depression   . GERD (gastroesophageal reflux disease)   . Hypertension   . Migraine   . Sarcoidosis   . Seizures ([place])    last one year ago    MEDICATIONS AT HOME: Prior to Admission medications   Medication Sig Start Date  End Date Taking? Authorizing Provider  ALPRAZolam Duanne Moron) 1 MG tablet Take 1 mg by mouth daily as needed for anxiety.   Yes [provider]  busPIRone (BUSPAR) 7.5 MG tablet Take 1 tablet (7.5 mg total) by mouth 3 (three) times daily. 11/24/17  Yes McClungDionne Bucy, PA-C  DEXILANT 60 MG capsule Take 60 mg by mouth daily.   Yes [provider]  mometasone-formoterol (DULERA) 100-5 MCG/ACT AERO Inhale 2 puffs into the lungs 2 (two) times daily. 02/14/17  Yes Tanda Rockers, MD  sertraline (ZOLOFT) 50 MG tablet Take 1 tablet (50 mg total) by mouth daily. 11/24/17  Yes Freeman Caldron M, PA-C  topiramate (TOPAMAX) 25 MG tablet Take 2 tablets (50 mg total) by mouth 2 (two) times daily. 02/21/17  Yes Kathrynn Ducking, MD   amLODipine (NORVASC) 5 MG tablet Take 1 tablet (5 mg total) by mouth daily. 11/24/17   Argentina Donovan, PA-C  budesonide-formoterol (SYMBICORT) 80-4.5 MCG/ACT inhaler Inhale 2 puffs into the lungs 2 (two) times daily. 05/06/17   Tanda Rockers, MD  budesonide-formoterol (SYMBICORT) 80-4.5 MCG/ACT inhaler Inhale 2 puffs into the lungs 2 (two) times daily. 11/16/17   Tanda Rockers, MD  butalbital-acetaminophen-caffeine (FIORICET, ESGIC) [phone] MG tablet Take 1-2 tablets by mouth every 6 (six) hours as needed for headache. 11/16/17 11/16/18  Ward, Delice Bison, DO  metoCLOPramide (REGLAN) 10 MG tablet Take 1 tablet (10 mg total) by mouth every 6 (six) hours as needed for nausea (nausea/headache). Patient not taking: Reported on 11/16/2017 09/23/17   Street, [place], [place]  oxyCODONE-acetaminophen (PERCOCET/ROXICET) 5-325 MG tablet Take 2 tablets by mouth every 4 (four) hours as needed for severe pain. Patient not taking: Reported on 09/23/2017 02/13/17   Lacretia Leigh, MD  phenytoin (DILANTIN) 100 MG ER capsule Take 3 capsules (300 mg total) by mouth at bedtime. Patient not taking: Reported on 09/23/2017 02/12/17   Varney Biles, MD  promethazine (PHENERGAN) 25 MG tablet Take 1 tablet (25 mg total) by mouth every 6 (six) hours as needed for nausea or vomiting. 11/16/17   Ward, Delice Bison, DO  rizatriptan (MAXALT) 10 MG tablet Take 1 tablet (10 mg total) by mouth every 2 (two) hours as needed for migraine. May repeat in 2 hours if needed. MAX 30mg  (3 tablets) PER DAY Patient not taking: Reported on 11/16/2017 09/23/17   Street, [place], PA-C  traZODone (DESYREL) 100 MG tablet Take 1 tablet (100 mg total) by mouth at bedtime. Patient not taking: Reported on 11/16/2017 02/28/17   Kathrynn Ducking, MD  Vitamin D, Ergocalciferol, (DRISDOL) 50000 units CAPS capsule Take 1 capsule (50,000 Units total) by mouth every 7 (seven) days. 11/24/17   Argentina Donovan, PA-C     Objective:  EXAM:   Vitals:    11/24/17 1533  BP: 130/87  Pulse: 82  Resp: 16  Temp: 99 F (37.2 C)  TempSrc: Oral  SpO2: 98%  Weight: (!) 303 lb 9.6 oz (137.7 kg)    General appearance : A&OX3. NAD. Non-toxic-appearing HEENT: Atraumatic and Normocephalic.  PERRLA. EOM intact.  TM clear B. Mouth-MMM, post pharynx WNL w/o erythema, No PND. Neck: supple, no JVD. No cervical lymphadenopathy. No thyromegaly Chest/Lungs:  Breathing-non-labored, Good air entry bilaterally, breath sounds normal without rales, rhonchi, or wheezing  CVS: S1 S2 regular, no murmurs, gallops, rubs  Extremities: Bilateral Lower Ext shows no edema, both legs are warm to touch with = pulse throughout Neurology:  CN II-XII grossly intact, Non focal.  Psych:  TP linear. J/I WNL. Normal speech. Appropriate eye contact and affect.  Skin:  No Rash  Data Review No results found for: HGBA1C   Assessment & Plan   1. Hypertension, unspecified type Almost at goal with some higher numbers at home- - Comprehensive metabolic panel - amLODipine (NORVASC) 5 MG tablet; Take 1 tablet (5 mg total) by mouth daily.  Dispense: 90 tablet; Refill: 3  2. Fatigue, unspecified type Counseled on Self-care, adequate sleep, exercise, hydration - TSH - CBC with Differential/Platelet  3. Anxiety I had her meet with Christa See today for additional resources for anxiety and depression.  There are no acute safety issues today.   - busPIRone (BUSPAR) 7.5 MG tablet; Take 1 tablet (7.5 mg total) by mouth 3 (three) times daily.  Dispense: 90 tablet; Refill: 3 - sertraline (ZOLOFT) 50 MG tablet; Take 1 tablet (50 mg total) by mouth daily.  Dispense: 90 tablet; Refill: 1  4. Vitamin D deficiency - Vitamin D, Ergocalciferol, (DRISDOL) 50000 units CAPS capsule; Take 1 capsule (50,000 Units total) by mouth every 7 (seven) days.  Dispense: 16 capsule; Refill: 0 -vitamin D level (2 years ago it was 8!)  Also RF Dexilant and Symbicort.    Patient have been counseled  extensively about nutrition and exercise  Return in about 6 weeks (around 01/05/2018) for assign PCP; f/up anxiety/htn.  The patient was given clear instructions to go to ER or return to medical center if symptoms don't improve, worsen or new problems develop. The patient verbalized understanding. The patient was told to call to get lab results if they haven't heard anything in the next week.     Freeman Caldron, PA-C [facility] [place], [place]   11/24/2017, 4:08 PM

## 2017-11-24 ENCOUNTER — Other Ambulatory Visit: Payer: Self-pay

## 2017-11-24 ENCOUNTER — Ambulatory Visit: Payer: Self-pay | Admitting: Licensed Clinical Social Worker

## 2017-11-24 ENCOUNTER — Ambulatory Visit: Payer: Self-pay | Attending: Internal Medicine | Admitting: Physician Assistant

## 2017-11-24 VITALS — BP 130/87 | HR 82 | Temp 99.0°F | Resp 16 | Wt 303.6 lb

## 2017-11-24 DIAGNOSIS — E559 Vitamin D deficiency, unspecified: Secondary | ICD-10-CM | POA: Insufficient documentation

## 2017-11-24 DIAGNOSIS — F419 Anxiety disorder, unspecified: Secondary | ICD-10-CM

## 2017-11-24 DIAGNOSIS — G43909 Migraine, unspecified, not intractable, without status migrainosus: Secondary | ICD-10-CM | POA: Insufficient documentation

## 2017-11-24 DIAGNOSIS — Z79899 Other long term (current) drug therapy: Secondary | ICD-10-CM | POA: Insufficient documentation

## 2017-11-24 DIAGNOSIS — K219 Gastro-esophageal reflux disease without esophagitis: Secondary | ICD-10-CM | POA: Insufficient documentation

## 2017-11-24 DIAGNOSIS — N189 Chronic kidney disease, unspecified: Secondary | ICD-10-CM | POA: Insufficient documentation

## 2017-11-24 DIAGNOSIS — D869 Sarcoidosis, unspecified: Secondary | ICD-10-CM | POA: Insufficient documentation

## 2017-11-24 DIAGNOSIS — R5383 Other fatigue: Secondary | ICD-10-CM | POA: Insufficient documentation

## 2017-11-24 DIAGNOSIS — I129 Hypertensive chronic kidney disease with stage 1 through stage 4 chronic kidney disease, or unspecified chronic kidney disease: Secondary | ICD-10-CM | POA: Insufficient documentation

## 2017-11-24 DIAGNOSIS — I10 Essential (primary) hypertension: Secondary | ICD-10-CM

## 2017-11-24 DIAGNOSIS — R42 Dizziness and giddiness: Secondary | ICD-10-CM | POA: Insufficient documentation

## 2017-11-24 DIAGNOSIS — J45909 Unspecified asthma, uncomplicated: Secondary | ICD-10-CM | POA: Insufficient documentation

## 2017-11-24 DIAGNOSIS — F329 Major depressive disorder, single episode, unspecified: Secondary | ICD-10-CM | POA: Insufficient documentation

## 2017-11-24 MED ORDER — VITAMIN D (ERGOCALCIFEROL) 1.25 MG (50000 UNIT) PO CAPS: 50000.0000 [IU] | ORAL_CAPSULE | ORAL | 0 refills | Status: DC

## 2017-11-24 MED ORDER — BUDESONIDE-FORMOTEROL FUMARATE 80-4.5 MCG/ACT IN AERO: 2.0000 | INHALATION_SPRAY | Freq: Two times a day (BID) | RESPIRATORY_TRACT | 0 refills | Status: DC

## 2017-11-24 MED ORDER — BUSPIRONE HCL 7.5 MG PO TABS: 7.5000 mg | ORAL_TABLET | Freq: Three times a day (TID) | ORAL | 3 refills | Status: DC

## 2017-11-24 MED ORDER — MOMETASONE FURO-FORMOTEROL FUM 100-5 MCG/ACT IN AERO: 2.0000 | INHALATION_SPRAY | Freq: Two times a day (BID) | RESPIRATORY_TRACT | 5 refills | Status: DC

## 2017-11-24 MED ORDER — AMLODIPINE BESYLATE 5 MG PO TABS: 5.0000 mg | ORAL_TABLET | Freq: Every day | ORAL | 3 refills | Status: DC

## 2017-11-24 MED ORDER — DEXILANT 60 MG PO CPDR: 60.0000 mg | DELAYED_RELEASE_CAPSULE | Freq: Every day | ORAL | 6 refills | Status: DC

## 2017-11-24 MED ORDER — SERTRALINE HCL 50 MG PO TABS: 50.0000 mg | ORAL_TABLET | Freq: Every day | ORAL | 1 refills | Status: DC

## 2017-11-24 NOTE — Patient Instructions (Signed)
Check blood pressure daily and record and bring to your next visit

## 2017-11-24 NOTE — BH Specialist Note (Signed)
Integrated Behavioral Health Initial Visit  MRN: [id number] Name: Victoria Moore  Number of [facility] Clinician visits:: 1/6 Session Start time: 4:20 PM  Session End time: 4:50 PM Total time: 30 minutes  Type of Service: [place] Interpretor:No. Interpretor Name and Language: N/A   Warm Hand Off Completed.       SUBJECTIVE: Victoria Moore is a 41 y.o. female accompanied by minor child Patient was referred by Weyman Pedro for depression and anxiety. Patient reports the following symptoms/concerns: overwhelming feelings of sadness and worry, difficulty sleeping, low energy, decreased concentration, and irritability Duration of problem: Ongoing Pt reports being diagnosed with anxiety "years ago"; Severity of problem: severe  OBJECTIVE: Mood: Anxious and Affect: Appropriate Risk of harm to self or others: No plan to harm self or others  LIFE CONTEXT: Family and Social: Pt reports receiving support from family and friends. She has an adult child and three year old grandchild School/Work: Pt is a Ship broker at [employer]. She is pursuing a degree in business administration in healthcare Self-Care: Pt states that she prefers to stay busy to cope with stressors Life Changes: Pt reports stressors, in addition, to ongoing medical concerns. Reports that she lost insurance and is unable to see her primary PCP until 2019  GOALS ADDRESSED: Patient will: 1. Reduce symptoms of: anxiety and depression 2. Increase knowledge and/or ability of: coping skills  3. Demonstrate ability to: Increase adequate support systems for patient/family  INTERVENTIONS: Interventions utilized: Mindfulness or Psychologist, educational, Psychoeducation and/or Health Education and Link to [employer]  Standardized Assessments completed: GAD-7 and PHQ 2&9  ASSESSMENT: Patient currently experiencing depression and anxiety triggered by ongoing medical  conditions. Pt reports overwhelming feelings of sadness and worry, difficulty sleeping, low energy, decreased concentration, and irritability. She recently lost insurance coverage and was unable to schedule appointment with previous PCP.   Patient may benefit from psychoeducation and psychotherapy. LCSWA educated pt on the correlation between one's physical and mental health. LCSWA discussed sleep hygiene and taught therapeutic interventions on mindfulness. Pt was provided resources on psychotherapy and emotional well-being apps to assist in decrease of symptoms. Pt is participating in medication management through PCP.   PLAN: 1. Follow up with behavioral health clinician on : Pt was encouraged to contact LCSWA if symptoms worsen or fail to improve to schedule behavioral appointments at [facility]. 2. Behavioral recommendations: LCSWA recommends that pt apply healthy coping skills discussed, comply with medication management, and utilize provided resources. Pt is encouraged to schedule follow up appointment with LCSWA 3. Referral(s): [place] (In Clinic) 4. "From scale of 1-10, how likely are you to follow plan?": 8/10  Rebekah Chesterfield, LCSW 11/25/17 10:10 AM

## 2017-11-24 NOTE — Progress Notes (Signed)
Discuss anxiety and HTN

## 2017-11-25 LAB — COMPREHENSIVE METABOLIC PANEL
ALT: 11 IU/L (ref 0–32)
AST: 18 IU/L (ref 0–40)
Albumin/Globulin Ratio: 1.6 (ref 1.2–2.2)
Albumin: 4.3 g/dL (ref 3.5–5.5)
Alkaline Phosphatase: 62 IU/L (ref 39–117)
BUN/Creatinine Ratio: 12 (ref 9–23)
BUN: 12 mg/dL (ref 6–24)
Bilirubin Total: <0.2 mg/dL (ref 0.0–1.2)
CO2: 21 mmol/L (ref 20–29)
Calcium: 9.4 mg/dL (ref 8.7–10.2)
Chloride: 104 mmol/L (ref 96–106)
Creatinine, Ser: 1.03 mg/dL — ABNORMAL HIGH (ref 0.57–1.00)
GFR calc Af Amer: 77 mL/min/{1.73_m2} (ref >59)
GFR calc non Af Amer: 67 mL/min/{1.73_m2} (ref >59)
Globulin, Total: 2.7 g/dL (ref 1.5–4.5)
Glucose: 95 mg/dL (ref 65–99)
Potassium: 4.1 mmol/L (ref 3.5–5.2)
Sodium: 139 mmol/L (ref 134–144)
Total Protein: 7 g/dL (ref 6.0–8.5)

## 2017-11-25 LAB — CBC WITH DIFFERENTIAL/PLATELET
Basophils Absolute: 0 10*3/uL (ref 0.0–0.2)
Basos: 1 %
EOS (ABSOLUTE): 0.2 10*3/uL (ref 0.0–0.4)
Eos: 3 %
Hematocrit: 38.8 % (ref 34.0–46.6)
Hemoglobin: 12.4 g/dL (ref 11.1–15.9)
Immature Grans (Abs): 0 10*3/uL (ref 0.0–0.1)
Immature Granulocytes: 1 %
Lymphocytes Absolute: 1.9 10*3/uL (ref 0.7–3.1)
Lymphs: 30 %
MCH: 26.7 pg (ref 26.6–33.0)
MCHC: 32 g/dL (ref 31.5–35.7)
MCV: 84 fL (ref 79–97)
Monocytes Absolute: 0.5 10*3/uL (ref 0.1–0.9)
Monocytes: 8 %
Neutrophils Absolute: 3.6 10*3/uL (ref 1.4–7.0)
Neutrophils: 57 %
Platelets: 244 10*3/uL (ref 150–379)
RBC: 4.64 x10E6/uL (ref 3.77–5.28)
RDW: 14.6 % (ref 12.3–15.4)
WBC: 6.2 10*3/uL (ref 3.4–10.8)

## 2017-11-25 LAB — TSH: TSH: 1.75 u[IU]/mL (ref 0.450–4.500)

## 2017-11-25 LAB — VITAMIN D 25 HYDROXY (VIT D DEFICIENCY, FRACTURES): Vit D, 25-Hydroxy: 20.3 ng/mL — ABNORMAL LOW (ref 30.0–100.0)

## 2017-12-01 ENCOUNTER — Ambulatory Visit: Payer: Self-pay | Attending: Nurse Practitioner

## 2017-12-29 ENCOUNTER — Ambulatory Visit: Payer: Self-pay | Admitting: Internal Medicine

## 2018-01-06 ENCOUNTER — Encounter: Payer: Self-pay | Admitting: Nurse Practitioner

## 2018-01-06 ENCOUNTER — Ambulatory Visit: Payer: BLUE CROSS/BLUE SHIELD | Attending: Nurse Practitioner | Admitting: Nurse Practitioner

## 2018-01-06 VITALS — BP 106/72 | HR 72 | Temp 98.5°F | Ht 71.0 in | Wt 309.6 lb

## 2018-01-06 DIAGNOSIS — M19079 Primary osteoarthritis, unspecified ankle and foot: Secondary | ICD-10-CM

## 2018-01-06 DIAGNOSIS — M1990 Unspecified osteoarthritis, unspecified site: Secondary | ICD-10-CM | POA: Insufficient documentation

## 2018-01-06 DIAGNOSIS — M549 Dorsalgia, unspecified: Secondary | ICD-10-CM | POA: Diagnosis not present

## 2018-01-06 DIAGNOSIS — D869 Sarcoidosis, unspecified: Secondary | ICD-10-CM | POA: Diagnosis not present

## 2018-01-06 DIAGNOSIS — I10 Essential (primary) hypertension: Secondary | ICD-10-CM

## 2018-01-06 DIAGNOSIS — R5382 Chronic fatigue, unspecified: Secondary | ICD-10-CM | POA: Diagnosis present

## 2018-01-06 DIAGNOSIS — R569 Unspecified convulsions: Secondary | ICD-10-CM

## 2018-01-06 DIAGNOSIS — F419 Anxiety disorder, unspecified: Secondary | ICD-10-CM | POA: Insufficient documentation

## 2018-01-06 DIAGNOSIS — J45991 Cough variant asthma: Secondary | ICD-10-CM | POA: Diagnosis not present

## 2018-01-06 DIAGNOSIS — Z0189 Encounter for other specified special examinations: Secondary | ICD-10-CM | POA: Insufficient documentation

## 2018-01-06 DIAGNOSIS — G43019 Migraine without aura, intractable, without status migrainosus: Secondary | ICD-10-CM | POA: Diagnosis not present

## 2018-01-06 DIAGNOSIS — K219 Gastro-esophageal reflux disease without esophagitis: Secondary | ICD-10-CM

## 2018-01-06 DIAGNOSIS — Z79899 Other long term (current) drug therapy: Secondary | ICD-10-CM | POA: Insufficient documentation

## 2018-01-06 DIAGNOSIS — I129 Hypertensive chronic kidney disease with stage 1 through stage 4 chronic kidney disease, or unspecified chronic kidney disease: Secondary | ICD-10-CM | POA: Diagnosis not present

## 2018-01-06 DIAGNOSIS — N189 Chronic kidney disease, unspecified: Secondary | ICD-10-CM | POA: Diagnosis not present

## 2018-01-06 DIAGNOSIS — F329 Major depressive disorder, single episode, unspecified: Secondary | ICD-10-CM | POA: Diagnosis not present

## 2018-01-06 DIAGNOSIS — K210 Gastro-esophageal reflux disease with esophagitis: Secondary | ICD-10-CM | POA: Insufficient documentation

## 2018-01-06 MED ORDER — DEXILANT 60 MG PO CPDR
60.0000 mg | DELAYED_RELEASE_CAPSULE | Freq: Every day | ORAL | 6 refills | Status: DC
Start: 2018-01-06 — End: 2018-02-08

## 2018-01-06 MED ORDER — ALBUTEROL SULFATE HFA 108 (90 BASE) MCG/ACT IN AERS: 1.0000 | INHALATION_SPRAY | RESPIRATORY_TRACT | 1 refills | Status: DC | PRN

## 2018-01-06 MED ORDER — DICLOFENAC SODIUM 75 MG PO TBEC: 75.0000 mg | DELAYED_RELEASE_TABLET | Freq: Two times a day (BID) | ORAL | 1 refills | Status: DC

## 2018-01-06 MED ORDER — BUTALBITAL-APAP-CAFFEINE 50-325-40 MG PO TABS: 1.0000 | ORAL_TABLET | Freq: Four times a day (QID) | ORAL | 0 refills | Status: DC | PRN

## 2018-01-06 NOTE — Progress Notes (Signed)
Assessment & Plan:  Victoria Moore was seen today for establish care.  Diagnoses and all orders for this visit:  Chronic fatigue -     Vitamin B12 -     Ambulatory referral to Rheumatology  Essential hypertension Continue medications as prescribed Continue all antihypertensives as prescribed.  Remember to bring in your blood pressure log with you for your follow up appointment.  DASH/Mediterranean Diets are healthier choices for HTN.   Common migraine with intractable migraine -     butalbital-acetaminophen-caffeine (FIORICET, ESGIC) 50-325-40 MG tablet; Take 1-2 tablets by mouth every 6 (six) hours as needed for headache.  Arthritis of ankle joint -     diclofenac (VOLTAREN) 75 MG EC tablet; Take 1 tablet (75 mg total) by mouth 2 (two) times daily. 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 Instructed: You must burn more calories than you eat. Losing 5 percent of your body weight should be considered a success. In the longer term, losing more than 15 percent of your body weight and staying at this weight is an extremely good result. However, keep in mind that even losing 5 percent of your body weight leads to important health benefits, so try not to get discouraged if you're not able to lose more than this. Will recheck weight in 3-6 months.  Cough variant asthma vs UACS  -     albuterol (PROVENTIL HFA;VENTOLIN HFA) 108 (90 Base) MCG/ACT inhaler; Inhale 1-2 puffs into the lungs every 4 (four) hours as needed for wheezing or shortness of breath (cough). NEEDS to follow up with PULMONOLOGY Avoid allergy/asthma triggers  Gastroesophageal reflux disease, esophagitis presence not specified -     DEXILANT 60 MG capsule; Take 1 capsule (60 mg total) by mouth daily. Failed Nexium and pepcid Needs to  lose weight Avoid GERD triggers; sodas, caffeine, no smoking, chocolate, fried, spicy and acidic foods.    Patient has been counseled on age-appropriate routine health concerns for screening and prevention. These are reviewed and up-to-date. Referrals have been placed accordingly. Immunizations are up-to-date or declined.    Subjective:   Chief Complaint  Patient presents with  . Establish Care    Patient is here to establish care. Patient stated she would like to know if she can get Albuterol inhaler 90 mcg-200. Patient stated her body is in pain eveyrwhere and would like pain medications for it.    HPI Victoria Moore 42 y.o. female presents to office today to establish care as a new patient.   Essential Hypertension This is a chronic condition. Blood pressure is well controlled today on amlodipine. She endorses medication compliance. Denies chest pain, shortness of breath, palpitations, lightheadedness, dizziness, headaches or BLE edema.  BP Readings from Last 3 Encounters:  01/06/18 106/72  11/24/17 130/87  11/16/17 (!) 148/100    Chronic Fatigue This has been ongoing along with joint and back pain. She had a trial of prednisone in the past which relieved all of her pain however pain returned several days later after completion of steroids. . Pulmonology has recommended Rheumatology consult for possible fibromyalgia. Based on review of labs she has a history of elevated ESR in the past. No rheumatoid panel on file. Patient reports being diagnosed with sarcoidosis in this past. It does not appear patient has sarcoidosis based on imaging and pulmonology's review of her  records. Will refer to rheumatology.     Cough Variant Asthma Patient was a NO SHOW on 12-29-2017 for her pulmonology appointment. Based on review of most recent records she requested Dulera on 11-16-2017  from her pulmonologist however she had not been seen in over a year therefore symbicort was prescribed and patient was  instructed to make office appointment with pulmo. She was seen on 11-24-2017 here in this office for a hospital follow up and was then prescribed her Dulera but not by her pulmonologist. I instructed her today that she still needs to see pulmonology and her PCP should not routinely manage her inhalers if her asthma is poorly controlled.    Review of Systems  Constitutional: Positive for malaise/fatigue. Negative for fever and weight loss.  HENT: Negative.  Negative for nosebleeds.   Eyes: Negative.  Negative for blurred vision, double vision and photophobia.  Respiratory: Positive for cough (intermittent), shortness of breath (with exertion) and wheezing. Negative for sputum production.   Cardiovascular: Negative.  Negative for chest pain, palpitations and leg swelling.  Gastrointestinal: Positive for heartburn. Negative for abdominal pain, constipation, diarrhea, nausea and vomiting.  Musculoskeletal: Positive for back pain, joint pain and myalgias.  Neurological: Positive for headaches. Negative for dizziness, focal weakness and seizures (history of).  Endo/Heme/Allergies: Negative for environmental allergies.  Psychiatric/Behavioral: Positive for depression. Negative for suicidal ideas. The patient is nervous/anxious and has insomnia.     Past Medical History:  Diagnosis Date  . Allergy   . Anemia   . Anxiety   . Arthritis   . Asthma   . Blood transfusion without reported diagnosis   . Chronic kidney disease   . Common migraine with intractable migraine 10/07/2016  . Depression   . GERD (gastroesophageal reflux disease)   . Hypertension   . Migraine   . Sarcoidosis   . Seizures ([place])    last one year ago    Past Surgical History:  Procedure Laterality Date  . ABDOMINAL HYSTERECTOMY    . LAPAROSCOPIC OVARIAN CYSTECTOMY Left 03/11/2016   Procedure: LAPAROSCOPIC OVARIAN CYSTECTOMY;  Surgeon: Eldred Manges, MD;  Location: [facility];  Service: Gynecology;  Laterality: Left;  .  LAPAROSCOPIC VAGINAL HYSTERECTOMY WITH SALPINGECTOMY Bilateral 03/11/2016   Procedure: LAPAROSCOPIC ASSISTED VAGINAL HYSTERECTOMY WITH SALPINGECTOMY;  Surgeon: Eldred Manges, MD;  Location: [facility];  Service: Gynecology;  Laterality: Bilateral;  . TUBAL LIGATION      Family History  Adopted: Yes  Problem Relation Age of Onset  . Other Son        Growing pains  . Asthma Mother   . Heart Problems Mother   . Migraines Mother   . Diabetes Father   . Peptic Ulcer Father   . Heart Problems Father     Social History Reviewed with no changes to be made today.   Outpatient Medications Prior to Visit  Medication Sig Dispense Refill  . amLODipine (NORVASC) 5 MG tablet Take 1 tablet (5 mg total) by mouth daily. 90 tablet 3  . busPIRone (BUSPAR) 7.5 MG tablet Take 1 tablet (7.5 mg total) by mouth 3 (three) times daily. 90 tablet 3  . mometasone-formoterol (DULERA) 100-5 MCG/ACT AERO Inhale 2 puffs into the lungs 2 (two) times daily. 1 Inhaler 5  . sertraline (ZOLOFT) 50 MG tablet Take 1 tablet (50 mg total) by mouth daily. 90 tablet 1  . topiramate (TOPAMAX) 25 MG tablet Take 2 tablets (50 mg total) by mouth 2 (two) times daily. 120 tablet  3  . traZODone (DESYREL) 100 MG tablet Take 1 tablet (100 mg total) by mouth at bedtime. 30 tablet 1  . Vitamin D, Ergocalciferol, (DRISDOL) 50000 units CAPS capsule Take 1 capsule (50,000 Units total) by mouth every 7 (seven) days. 16 capsule 0  . ALPRAZolam (XANAX) 1 MG tablet Take 1 mg by mouth daily as needed for anxiety.    . DEXILANT 60 MG capsule Take 1 capsule (60 mg total) by mouth daily. 30 capsule 6  . metoCLOPramide (REGLAN) 10 MG tablet Take 1 tablet (10 mg total) by mouth every 6 (six) hours as needed for nausea (nausea/headache). (Patient not taking: Reported on 11/16/2017) 12 tablet 0  . budesonide-formoterol (SYMBICORT) 80-4.5 MCG/ACT inhaler Inhale 2 puffs into the lungs 2 (two) times daily. (Patient not taking: Reported on 01/06/2018) 3  Inhaler 0  . budesonide-formoterol (SYMBICORT) 80-4.5 MCG/ACT inhaler Inhale 2 puffs into the lungs 2 (two) times daily. (Patient not taking: Reported on 01/06/2018) 1 Inhaler 0  . butalbital-acetaminophen-caffeine (FIORICET, ESGIC) 50-325-40 MG tablet Take 1-2 tablets by mouth every 6 (six) hours as needed for headache. (Patient not taking: Reported on 01/06/2018) 20 tablet 0  . oxyCODONE-acetaminophen (PERCOCET/ROXICET) 5-325 MG tablet Take 2 tablets by mouth every 4 (four) hours as needed for severe pain. (Patient not taking: Reported on 09/23/2017) 10 tablet 0  . phenytoin (DILANTIN) 100 MG ER capsule Take 3 capsules (300 mg total) by mouth at bedtime. (Patient not taking: Reported on 09/23/2017) 90 capsule 1  . promethazine (PHENERGAN) 25 MG tablet Take 1 tablet (25 mg total) by mouth every 6 (six) hours as needed for nausea or vomiting. (Patient not taking: Reported on 01/06/2018) 15 tablet 0  . rizatriptan (MAXALT) 10 MG tablet Take 1 tablet (10 mg total) by mouth every 2 (two) hours as needed for migraine. May repeat in 2 hours if needed. MAX '30mg'$  (3 tablets) PER DAY (Patient not taking: Reported on 11/16/2017) 10 tablet 0   No facility-administered medications prior to visit.     Allergies  Allergen Reactions  . Aspirin Anaphylaxis and Hives            Objective:    BP 106/72 (BP Location: Left Arm, Patient Position: Sitting, Cuff Size: Large)   Pulse 72   Temp 98.5 F (36.9 C) (Oral)   Ht '5\' 11"'$  (1.803 m)   Wt (!) 309 lb 9.6 oz (140.4 kg)   LMP 02/19/2016   SpO2 96%   BMI 43.18 kg/m  Wt Readings from Last 3 Encounters:  01/06/18 (!) 309 lb 9.6 oz (140.4 kg)  11/24/17 (!) 303 lb 9.6 oz (137.7 kg)  09/23/17 294 lb (133.4 kg)    Physical Exam  Constitutional: She is oriented to person, place, and time. She appears well-developed and well-nourished. She is cooperative.  HENT:  Head: Normocephalic and atraumatic.  Eyes: EOM are normal.  Neck: Normal range of motion.    Cardiovascular: Normal rate, regular rhythm, normal heart sounds and intact distal pulses. Exam reveals no gallop and no friction rub.  No murmur heard. Pulmonary/Chest: Effort normal and breath sounds normal. No tachypnea. No respiratory distress. She has no decreased breath sounds. She has no wheezes. She has no rhonchi. She has no rales. She exhibits no tenderness.  Abdominal: Soft. Bowel sounds are normal.  Musculoskeletal: Normal range of motion. She exhibits no edema.  Neurological: She is alert and oriented to person, place, and time. Coordination normal.  Skin: Skin is warm and dry.  Psychiatric: She  has a normal mood and affect. Her behavior is normal. Judgment and thought content normal.  Nursing note and vitals reviewed.     Patient has been counseled extensively about nutrition and exercise as well as the importance of adherence with medications and regular follow-up. The patient was given clear instructions to go to ER or return to medical center if symptoms don't improve, worsen or new problems develop. The patient verbalized understanding.   Follow-up: Return in about 2 months (around 03/06/2018) for follow up.   Gildardo Pounds, FNP-BC [facility] and [facility] [place], [place]   01/08/2018, 11:14 AM

## 2018-01-06 NOTE — Patient Instructions (Addendum)
  Chronic Fatigue Syndrome Chronic fatigue syndrome (CFS) is a condition that causes extreme tiredness (fatigue). This fatigue does not improve with rest, and it gets worse with physical or mental activity. You may have several other symptoms along with fatigue. Symptoms may come and go, but they generally last for months. Sometimes, CFS gets better over time, but it can be a lifelong condition. There is no cure, but there are many possible treatments. You will need to work with your health care providers to find a treatment plan that works best for you. What are the causes? The cause of CFS is not known. There may be more than one cause. Possible causes include:  An infection.  An abnormal body defense system (immune system).  Low blood pressure.  Poor diet.  Physical or emotional stress.  What increases the risk? You are more likely to develop this condition if:  You are female.  You are 40?44 years old.  You have a family history of CFS.  You live with a lot of emotional stress.  What are the signs or symptoms? The main symptom of CFS is fatigue that is severe enough to interfere with day-to-day activities. This fatigue does not get better with rest, and it gets worse with physical or mental activity. There are eight other major symptoms of CFS:  Lack of energy (malaise) that lasts more than 24 hours after physical exertion.  Sleep that does not relieve fatigue (unrefreshing sleep).  Short-term memory loss or confusion.  Joint pain without redness or swelling.  Muscle aches.  Headaches.  Painful and swollen glands (lymph nodes) in the neck or under the arms.  Sore throat.  You may also have:  Abdominal cramps, constipation, or diarrhea (irritable bowel).  Chills.  Night sweats.  Vision changes.  Dizziness.  Mental confusion (brain fog).  Clumsiness.  Sensitivity to food, noise, or odors.  Mood swings, depression, or anxiety attacks.  How is  this diagnosed? There are no tests that can diagnose this condition. Your health care provider will make the diagnosis based on your medical history, a physical exam, and a mental health exam. However, it is important to make sure that your symptoms are not caused by another medical condition. You may have lab tests or X-rays to rule out other conditions. For your health care provider to diagnose CFS:  You must have had fatigue for at least 6 straight months.  Fatigue must be your first symptom, and it must be severe enough to interfere with day-to-day activities.  There must be no other cause found for the fatigue.  You must also have at least four of the eight other major symptoms of CFS.  How is this treated? There is no cure for CFS. The condition affects everyone differently. You will need to work with your team of health care providers to find the best treatments for your symptoms. Your team may include your primary care provider, physical and exercise therapists, and mental health therapists. Treatment may include:  Improving sleep with a regular bedtime routine.  Avoiding caffeine, alcohol, and tobacco.  Doing light exercise and stretching during the day.  Taking medicines to help you sleep or to relieve joint or muscle pain.  Learning and practicing relaxation techniques.  Using memory aids or doing brainteasers to improve memory and concentration.  Seeing a mental health therapist to evaluate and treat depression, if necessary.  Trying massage therapy, acupuncture, and movement exercises, such as yoga or tai chi.  Follow   at home:  Activity  Exercise regularly, as told by your health care provider.  Avoid fatigue by pacing yourself during the day and getting enough sleep at night.  Go to bed and get up at the same time every day. Eating and drinking  Avoid caffeine and alcohol.  Avoid heavy meals in the evening.  Eat a well-balanced diet. General  instructions  Take over-the-counter and prescription medicines only as told by your health care provider.  Do not use herbal or dietary supplements unless they are approved by your health care provider.  Maintain a healthy weight.  Avoid stress and use stress-reducing techniques that you learn in therapy.  Do not use any products that contain nicotine or tobacco, such as cigarettes and e-cigarettes. If you need help quitting, ask your health care provider.  Consider joining a CFS support group.  Keep all follow-up visits as told by your health care provider. This is important. Contact a health care provider if:  Your symptoms do not get better or they get worse.  You feel angry, guilty, anxious, or depressed. This information is not intended to replace advice given to you by your health care provider. Make sure you discuss any questions you have with your health care provider. Document Released: 01/20/2005 Document Revised: 08/19/2016 Document Reviewed: 03/22/2016 Elsevier Interactive Patient Education  [employer].

## 2018-01-07 LAB — VITAMIN B12: Vitamin B-12: 707 pg/mL (ref 232–1245)

## 2018-01-08 ENCOUNTER — Encounter: Payer: Self-pay | Admitting: Nurse Practitioner

## 2018-01-09 ENCOUNTER — Encounter (HOSPITAL_COMMUNITY): Payer: Self-pay

## 2018-01-09 ENCOUNTER — Encounter: Payer: Self-pay | Admitting: Pharmacist

## 2018-01-09 ENCOUNTER — Other Ambulatory Visit: Payer: Self-pay

## 2018-01-09 ENCOUNTER — Emergency Department (HOSPITAL_COMMUNITY): Payer: BLUE CROSS/BLUE SHIELD

## 2018-01-09 ENCOUNTER — Emergency Department (HOSPITAL_COMMUNITY)
Admission: EM | Admit: 2018-01-09 | Discharge: 2018-01-09 | Disposition: A | Discharge status: Home / Self Care | Payer: BLUE CROSS/BLUE SHIELD | Attending: Emergency Medicine | Admitting: Emergency Medicine

## 2018-01-09 DIAGNOSIS — Z87891 Personal history of nicotine dependence: Secondary | ICD-10-CM | POA: Insufficient documentation

## 2018-01-09 DIAGNOSIS — Y92008 Other place in unspecified non-institutional (private) residence as the place of occurrence of the external cause: Secondary | ICD-10-CM | POA: Insufficient documentation

## 2018-01-09 DIAGNOSIS — W010XXA Fall on same level from slipping, tripping and stumbling without subsequent striking against object, initial encounter: Secondary | ICD-10-CM | POA: Insufficient documentation

## 2018-01-09 DIAGNOSIS — Y9389 Activity, other specified: Secondary | ICD-10-CM | POA: Diagnosis not present

## 2018-01-09 DIAGNOSIS — S82832A Other fracture of upper and lower end of left fibula, initial encounter for closed fracture: Secondary | ICD-10-CM | POA: Insufficient documentation

## 2018-01-09 DIAGNOSIS — J45909 Unspecified asthma, uncomplicated: Secondary | ICD-10-CM | POA: Diagnosis not present

## 2018-01-09 DIAGNOSIS — I129 Hypertensive chronic kidney disease with stage 1 through stage 4 chronic kidney disease, or unspecified chronic kidney disease: Secondary | ICD-10-CM | POA: Diagnosis not present

## 2018-01-09 DIAGNOSIS — N189 Chronic kidney disease, unspecified: Secondary | ICD-10-CM | POA: Diagnosis not present

## 2018-01-09 DIAGNOSIS — Y999 Unspecified external cause status: Secondary | ICD-10-CM | POA: Diagnosis not present

## 2018-01-09 DIAGNOSIS — S99812A Other specified injuries of left ankle, initial encounter: Secondary | ICD-10-CM | POA: Diagnosis present

## 2018-01-09 DIAGNOSIS — Z79899 Other long term (current) drug therapy: Secondary | ICD-10-CM | POA: Diagnosis not present

## 2018-01-09 MED ORDER — OXYCODONE-ACETAMINOPHEN 5-325 MG PO TABS
1.0000 | ORAL_TABLET | Freq: Once | ORAL | Status: AC
  Administered 2018-01-09: 1 via ORAL

## 2018-01-09 MED ORDER — OXYCODONE-ACETAMINOPHEN 5-325 MG PO TABS
2.0000 | ORAL_TABLET | Freq: Once | ORAL | Status: DC
  Filled 2018-01-09: qty 2

## 2018-01-09 MED ORDER — IBUPROFEN 600 MG PO TABS: 600.0000 mg | ORAL_TABLET | Freq: Four times a day (QID) | ORAL | 0 refills | Status: DC | PRN

## 2018-01-09 MED ORDER — OXYCODONE-ACETAMINOPHEN 5-325 MG PO TABS
1.0000 | ORAL_TABLET | ORAL | Status: DC | PRN
  Administered 2018-01-09: 1 via ORAL
  Filled 2018-01-09: qty 1

## 2018-01-09 MED ORDER — HYDROCODONE-ACETAMINOPHEN 5-325 MG PO TABS: 1.0000 | ORAL_TABLET | Freq: Four times a day (QID) | ORAL | 0 refills | Status: DC | PRN

## 2018-01-09 NOTE — Discharge Instructions (Signed)
Medications: Ibuprofen, Norco  Treatment: Take ibuprofen every 6 hours for your pain.  For breakthrough pain, take 1-2 Norco every 4-6 hours.  Keep your leg elevated whenever you are not walking on it.  Do not get your splint wet.  Keep it applied until you have been seen by the orthopedic doctor.  Do not drink alcohol, drive, operate machinery or participate in any other potentially dangerous activities while taking opiate pain medication as it may make you sleepy. Do not take this medication with any other sedating medications, either prescription or over-the-counter. If you were prescribed Percocet or Vicodin, do not take these with acetaminophen (Tylenol) as it is already contained within these medications and overdose of Tylenol is dangerous.   This medication is an opiate (or narcotic) pain medication and can be habit forming.  Use it as little as possible to achieve adequate pain control.  Do not use or use it with extreme caution if you have a history of opiate abuse or dependence. This medication is intended for your use only - do not give any to anyone else and keep it in a secure place where nobody else, especially children, have access to it. It will also cause or worsen constipation, so you may want to consider taking an over-the-counter stool softener while you are taking this medication.  Follow-up: Please follow-up with Dr. Berenice Primas for scheduling of appointment at the end of the week or early next week.  Please return to the emergency department if you develop any new or worsening symptoms.

## 2018-01-09 NOTE — ED Provider Notes (Signed)
[facility] EMERGENCY DEPARTMENT Provider Note   CSN: [id number] Arrival date & time: 01/09/18  1222     History   Chief Complaint Chief Complaint  Patient presents with  . Ankle Injury    HPI Victoria Moore is a 42 y.o. female with history of sarcoidosis, asthma, anxiety, depression, hypertension who presents with left ankle pain after slipping on mud below her front step.  She reports her leg went out from under her and her foot turned inward.  She has not been ambulatory since.  She reports paresthesias to her ankle and foot, but denies complete numbness.  She did not take any medication or try anything prior to arrival.  She denies hitting her head or losing consciousness.  She denies any neck or back pain.  HPI  Past Medical History:  Diagnosis Date  . Allergy   . Anemia   . Anxiety   . Arthritis   . Asthma   . Blood transfusion without reported diagnosis   . Chronic kidney disease   . Common migraine with intractable migraine 10/07/2016  . Depression   . GERD (gastroesophageal reflux disease)   . Hypertension   . Migraine   . Sarcoidosis   . Seizures ([place])    last one year ago    Patient Active Problem List   Diagnosis Date Noted  . Essential hypertension 11/15/2016  . 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 Morbid (severe) obesity due to excess calories ([place]) 10/23/2016  . Cough variant asthma vs UACS  10/23/2016  . Common migraine with intractable migraine 10/07/2016  . Dyspnea 04/15/2016  . Hilar adenopathy 04/15/2016  . Convulsions/seizures ([place]) 03/11/2016  . Fibroids 03/11/2016  . Anemia 11/12/2014  . Symptomatic anemia 11/12/2014  . Menorrhagia 11/12/2014  . Iron deficiency 11/12/2014    Past Surgical History:  Procedure Laterality Date  . ABDOMINAL HYSTERECTOMY    . LAPAROSCOPIC OVARIAN CYSTECTOMY Left 03/11/2016   Procedure: LAPAROSCOPIC OVARIAN CYSTECTOMY;  Surgeon: Eldred Manges, MD;  Location: [facility];  Service:  Gynecology;  Laterality: Left;  . LAPAROSCOPIC VAGINAL HYSTERECTOMY WITH SALPINGECTOMY Bilateral 03/11/2016   Procedure: LAPAROSCOPIC ASSISTED VAGINAL HYSTERECTOMY WITH SALPINGECTOMY;  Surgeon: Eldred Manges, MD;  Location: [facility];  Service: Gynecology;  Laterality: Bilateral;  . TUBAL LIGATION      OB History    Gravida Para Term Preterm AB Living   1             SAB TAB Ectopic Multiple Live Births                   Home Medications    Prior to Admission medications   Medication Sig Start Date End Date Taking? Authorizing Provider  albuterol (PROVENTIL HFA;VENTOLIN HFA) 108 (90 Base) MCG/ACT inhaler Inhale 1-2 puffs into the lungs every 4 (four) hours as needed for wheezing or shortness of breath (cough). 01/06/18  Yes Gildardo Pounds, NP  ALPRAZolam Duanne Moron) 1 MG tablet Take 0.5-1 mg by mouth daily as needed for anxiety. 11/26/17  Yes [provider]  amLODipine (NORVASC) 5 MG tablet Take 1 tablet (5 mg total) by mouth daily. 11/24/17  Yes McClung, Dionne Bucy, PA-C  busPIRone (BUSPAR) 7.5 MG tablet Take 1 tablet (7.5 mg total) by mouth 3 (three) times daily. 11/24/17  Yes Argentina Donovan, PA-C  butalbital-acetaminophen-caffeine [place], ESGIC) [phone] MG tablet Take 1-2 tablets by mouth every 6 (six) hours as needed for headache. 01/06/18 01/06/19 Yes Gildardo Pounds, NP  DEXILANT 60 MG capsule Take 1 capsule (60 mg total) by mouth daily. 01/06/18  Yes Gildardo Pounds, NP  diclofenac (VOLTAREN) 75 MG EC tablet Take 1 tablet (75 mg total) by mouth 2 (two) times daily. 01/06/18  Yes Gildardo Pounds, NP  mometasone-formoterol (DULERA) 100-5 MCG/ACT AERO Inhale 2 puffs into the lungs 2 (two) times daily. 11/24/17  Yes Freeman Caldron M, PA-C  sertraline (ZOLOFT) 50 MG tablet Take 1 tablet (50 mg total) by mouth daily. 11/24/17  Yes Argentina Donovan, PA-C  Vitamin D, Ergocalciferol, (DRISDOL) 50000 units CAPS capsule Take 1 capsule (50,000 Units total) by mouth every 7 (seven)  days. 11/24/17  Yes Argentina Donovan, PA-C  HYDROcodone-acetaminophen (NORCO/VICODIN) 5-325 MG tablet Take 1-2 tablets by mouth every 6 (six) hours as needed for severe pain. 01/09/18   Kj Imbert, Bea Graff, PA-C  ibuprofen (ADVIL,MOTRIN) 600 MG tablet Take 1 tablet (600 mg total) by mouth every 6 (six) hours as needed. 01/09/18   Agueda Houpt, Bea Graff, PA-C  metoCLOPramide (REGLAN) 10 MG tablet Take 1 tablet (10 mg total) by mouth every 6 (six) hours as needed for nausea (nausea/headache). Patient not taking: Reported on 11/16/2017 09/23/17   Street, [place], PA-C  topiramate (TOPAMAX) 25 MG tablet Take 2 tablets (50 mg total) by mouth 2 (two) times daily. Patient not taking: Reported on 01/09/2018 02/21/17   Kathrynn Ducking, MD  traZODone (DESYREL) 100 MG tablet Take 1 tablet (100 mg total) by mouth at bedtime. Patient not taking: Reported on 01/09/2018 02/28/17   Kathrynn Ducking, MD    Family History Family History  Adopted: Yes  Problem Relation Age of Onset  . Other Son        Growing pains  . Asthma Mother   . Heart Problems Mother   . Migraines Mother   . Diabetes Father   . Peptic Ulcer Father   . Heart Problems Father     Social History Social History   Tobacco Use  . Smoking status: Former Smoker    Packs/day: 0.25    Years: 17.00    Pack years: 4.25    Types: Cigarettes    Last attempt to quit: 03/02/2013    Years since quitting: 4.8  . Smokeless tobacco: Never Used  Substance Use Topics  . Alcohol use: No    Alcohol/week: 0.0 oz    Comment: only on special occasions  . Drug use: No    Comment: former - 6 yrs ago     Allergies   Aspirin   Review of Systems Review of Systems  Musculoskeletal: Positive for arthralgias (L ankle). Negative for back pain and neck pain.  Neurological: Positive for numbness (paresthesia). Negative for syncope.     Physical Exam Updated Vital Signs BP (!) 137/97 (BP Location: Left Arm)   Pulse 73   Temp 98.5 F (36.9 C) (Oral)    Resp 16   LMP 02/19/2016   SpO2 100%   Physical Exam  Constitutional: She appears well-developed and well-nourished. No distress.  HENT:  Head: Normocephalic and atraumatic.  Mouth/Throat: Oropharynx is clear and moist. No oropharyngeal exudate.  Eyes: Conjunctivae are normal. Pupils are equal, round, and reactive to light. Right eye exhibits no discharge. Left eye exhibits no discharge. No scleral icterus.  Neck: Normal range of motion. Neck supple. No thyromegaly present.  Cardiovascular: Normal rate, regular rhythm, normal heart sounds and intact distal pulses. Exam reveals no gallop and no friction rub.  No murmur heard. Pulmonary/Chest:  Effort normal and breath sounds normal. No stridor. No respiratory distress. She has no wheezes. She has no rales.  Abdominal: Soft. Bowel sounds are normal. She exhibits no distension. There is no tenderness. There is no rebound and no guarding.  Musculoskeletal: She exhibits no edema.       Left ankle: She exhibits decreased range of motion and swelling (significant only to lateral). She exhibits no laceration and normal pulse. Tenderness. Lateral malleolus tenderness found. No medial malleolus, no head of 5th metatarsal and no proximal fibula tenderness found. Achilles tendon normal.  Lymphadenopathy:    She has no cervical adenopathy.  Neurological: She is alert. Coordination normal.  Skin: Skin is warm and dry. No rash noted. She is not diaphoretic. No pallor.  Psychiatric: She has a normal mood and affect.  Nursing note and vitals reviewed.    ED Treatments / Results  Labs (all labs ordered are listed, but only abnormal results are displayed) Labs Reviewed - No data to display  EKG  EKG Interpretation None       Radiology Dg Ankle Complete Left  Result Date: 01/09/2018 CLINICAL DATA:  Left ankle pain and swelling after injury. EXAM: LEFT ANKLE COMPLETE - 3+ VIEW COMPARISON:  Radiographs of November 15, 2012. FINDINGS: Mildly  displaced and possibly comminuted fracture is seen involving the distal left fibula. Overlying soft tissue swelling is noted. Joint spaces are intact. IMPRESSION: Mildly displaced and possibly comminuted distal left fibular fracture. Electronically Signed   By: Marijo Conception, M.D.   On: 01/09/2018 14:22    Procedures Procedures (including critical care time)  SPLINT APPLICATION Date/Time: 6:73 PM Authorized by: Frederica Kuster Consent: Verbal consent obtained. Risks and benefits: risks, benefits and alternatives were discussed Consent given by: patient Splint applied by: orthopedic technician Location details: L ankle Splint type: Short leg posterior with stirrups Supplies used: Ortho-Glass, ACE wrap Post-procedure: The splinted body part was neurovascularly unchanged following the procedure. Patient tolerance: Patient tolerated the procedure well with no immediate complications.     Medications Ordered in ED Medications  oxyCODONE-acetaminophen (PERCOCET/ROXICET) 5-325 MG per tablet 1 tablet (1 tablet Oral Given 01/09/18 1324)  oxyCODONE-acetaminophen (PERCOCET/ROXICET) 5-325 MG per tablet 1 tablet (1 tablet Oral Given 01/09/18 1513)     Initial Impression / Assessment and Plan / ED Course  I have reviewed the triage vital signs and the nursing notes.  Pertinent labs & imaging results that were available during my care of the patient were reviewed by me and considered in my medical decision making (see chart for details).     Patient with left mildly displaced and possibly comminuted distal left fibular fracture.  Neurovascularly intact.  Orthopedic PA, Hilbert Odor, consulted who advised follow-up to orthopedics in 1 week and short leg splint with nonweightbearing.  Will discharge home with short course of Norco and ibuprofen for pain control.  I reviewed the [place] narcotic database and found no discrepancies supportive treatment discussed including elevation.  Patient advised  to keep splint dry.  Return precautions discussed.  Patient understands and agrees with plan.  Patient vitals stable throughout ED course and discharged in satisfactory condition.  Final Clinical Impressions(s) / ED Diagnoses   Final diagnoses:  Other closed fracture of distal end of left fibula, initial encounter    ED Discharge Orders        Ordered    ibuprofen (ADVIL,MOTRIN) 600 MG tablet  Every 6 hours PRN     01/09/18 1548    HYDROcodone-acetaminophen (  NORCO/VICODIN) 5-325 MG tablet  Every 6 hours PRN     01/09/18 1548       Frederica Kuster, PA-C 01/09/18 1550    Isla Pence, MD 01/09/18 1630

## 2018-01-09 NOTE — ED Triage Notes (Signed)
Pr Pt, Pt was coming down the steps when she fell and she had an ankle injury. Denies hitting head.

## 2018-01-09 NOTE — Progress Notes (Signed)
PA completed for Dexilant. May need to fail other alternatives, such as omeprazole and lansoprazole. Review by [facility] pending.

## 2018-01-09 NOTE — Progress Notes (Signed)
33Orthopedic Tech Progress Note Patient Details:  Victoria Moore November 13, 1976 [id number]  Ortho Devices Type of Ortho Device: Ace wrap, Post (short leg) splint, Crutches Ortho Device/Splint Location: LLE Ortho Device/Splint Interventions: Ordered, Application, Adjustment   Post Interventions Patient Tolerated: Well Instructions Provided: Care of device   Braulio Bosch 01/09/2018, 3:35 PM

## 2018-01-15 ENCOUNTER — Encounter: Payer: Self-pay | Admitting: Nurse Practitioner

## 2018-01-17 ENCOUNTER — Encounter: Payer: Self-pay | Admitting: Nurse Practitioner

## 2018-01-17 NOTE — Telephone Encounter (Signed)
Patient anxiety concern

## 2018-01-19 ENCOUNTER — Other Ambulatory Visit: Payer: Self-pay | Admitting: Nurse Practitioner

## 2018-01-19 DIAGNOSIS — F419 Anxiety disorder, unspecified: Secondary | ICD-10-CM

## 2018-01-19 MED ORDER — SERTRALINE HCL 100 MG PO TABS: 100.0000 mg | ORAL_TABLET | Freq: Every day | ORAL | 0 refills | Status: DC

## 2018-01-19 NOTE — Telephone Encounter (Signed)
My chart response

## 2018-02-05 ENCOUNTER — Emergency Department (HOSPITAL_COMMUNITY): Payer: BLUE CROSS/BLUE SHIELD

## 2018-02-05 ENCOUNTER — Emergency Department (HOSPITAL_COMMUNITY)
Admission: EM | Admit: 2018-02-05 | Discharge: 2018-02-05 | Disposition: A | Discharge status: Home / Self Care | Payer: BLUE CROSS/BLUE SHIELD | Attending: Emergency Medicine | Admitting: Emergency Medicine

## 2018-02-05 ENCOUNTER — Other Ambulatory Visit: Payer: Self-pay

## 2018-02-05 ENCOUNTER — Encounter (HOSPITAL_COMMUNITY): Payer: Self-pay

## 2018-02-05 DIAGNOSIS — I129 Hypertensive chronic kidney disease with stage 1 through stage 4 chronic kidney disease, or unspecified chronic kidney disease: Secondary | ICD-10-CM | POA: Diagnosis not present

## 2018-02-05 DIAGNOSIS — N390 Urinary tract infection, site not specified: Secondary | ICD-10-CM | POA: Insufficient documentation

## 2018-02-05 DIAGNOSIS — N189 Chronic kidney disease, unspecified: Secondary | ICD-10-CM | POA: Insufficient documentation

## 2018-02-05 DIAGNOSIS — J45909 Unspecified asthma, uncomplicated: Secondary | ICD-10-CM | POA: Diagnosis not present

## 2018-02-05 DIAGNOSIS — J069 Acute upper respiratory infection, unspecified: Secondary | ICD-10-CM | POA: Insufficient documentation

## 2018-02-05 DIAGNOSIS — Z79899 Other long term (current) drug therapy: Secondary | ICD-10-CM | POA: Diagnosis not present

## 2018-02-05 DIAGNOSIS — R51 Headache: Secondary | ICD-10-CM | POA: Diagnosis not present

## 2018-02-05 DIAGNOSIS — B9789 Other viral agents as the cause of diseases classified elsewhere: Secondary | ICD-10-CM | POA: Diagnosis not present

## 2018-02-05 DIAGNOSIS — Z87891 Personal history of nicotine dependence: Secondary | ICD-10-CM | POA: Insufficient documentation

## 2018-02-05 DIAGNOSIS — R519 Headache, unspecified: Secondary | ICD-10-CM

## 2018-02-05 LAB — COMPREHENSIVE METABOLIC PANEL
ALT: 13 U/L — ABNORMAL LOW (ref 14–54)
AST: 32 U/L (ref 15–41)
Albumin: 3.9 g/dL (ref 3.5–5.0)
Alkaline Phosphatase: 55 U/L (ref 38–126)
Anion gap: 9 (ref 5–15)
BUN: 9 mg/dL (ref 6–20)
CO2: 24 mmol/L (ref 22–32)
Calcium: 8.7 mg/dL — ABNORMAL LOW (ref 8.9–10.3)
Chloride: 103 mmol/L (ref 101–111)
Creatinine, Ser: 1.08 mg/dL — ABNORMAL HIGH (ref 0.44–1.00)
GFR calc Af Amer: >60 mL/min (ref >60)
GFR calc non Af Amer: >60 mL/min (ref >60)
Glucose, Bld: 112 mg/dL — ABNORMAL HIGH (ref 65–99)
Potassium: 4.6 mmol/L (ref 3.5–5.1)
Sodium: 136 mmol/L (ref 135–145)
Total Bilirubin: 0.8 mg/dL (ref 0.3–1.2)
Total Protein: 7.5 g/dL (ref 6.5–8.1)

## 2018-02-05 LAB — URINALYSIS, ROUTINE W REFLEX MICROSCOPIC
Bilirubin Urine: NEGATIVE
Glucose, UA: NEGATIVE mg/dL
Hgb urine dipstick: NEGATIVE
Ketones, ur: NEGATIVE mg/dL
Nitrite: NEGATIVE
Protein, ur: NEGATIVE mg/dL
Specific Gravity, Urine: 1.019 (ref 1.005–1.030)
pH: 5 (ref 5.0–8.0)

## 2018-02-05 LAB — CBC WITH DIFFERENTIAL/PLATELET
Basophils Absolute: 0 10*3/uL (ref 0.0–0.1)
Basophils Relative: 0 %
Eosinophils Absolute: 0.3 10*3/uL (ref 0.0–0.7)
Eosinophils Relative: 3 %
HCT: 36 % (ref 36.0–46.0)
Hemoglobin: 12.1 g/dL (ref 12.0–15.0)
Lymphocytes Relative: 24 %
Lymphs Abs: 2 10*3/uL (ref 0.7–4.0)
MCH: 28.9 pg (ref 26.0–34.0)
MCHC: 33.6 g/dL (ref 30.0–36.0)
MCV: 86.1 fL (ref 78.0–100.0)
Monocytes Absolute: 0.9 10*3/uL (ref 0.1–1.0)
Monocytes Relative: 10 %
Neutro Abs: 5.4 10*3/uL (ref 1.7–7.7)
Neutrophils Relative %: 63 %
Platelets: 240 10*3/uL (ref 150–400)
RBC: 4.18 MIL/uL (ref 3.87–5.11)
RDW: 13.8 % (ref 11.5–15.5)
WBC: 8.6 10*3/uL (ref 4.0–10.5)

## 2018-02-05 LAB — INFLUENZA PANEL BY PCR (TYPE A & B)
Influenza A By PCR: NEGATIVE
Influenza B By PCR: NEGATIVE

## 2018-02-05 LAB — RAPID STREP SCREEN (MED CTR MEBANE ONLY): Streptococcus, Group A Screen (Direct): NEGATIVE

## 2018-02-05 MED ORDER — ONDANSETRON HCL 4 MG/2ML IJ SOLN
4.0000 mg | Freq: Once | INTRAMUSCULAR | Status: AC
  Administered 2018-02-05: 4 mg via INTRAVENOUS
  Filled 2018-02-05: qty 2

## 2018-02-05 MED ORDER — HYDROCODONE-ACETAMINOPHEN 5-325 MG PO TABS: 1.0000 | ORAL_TABLET | ORAL | 0 refills | Status: DC | PRN

## 2018-02-05 MED ORDER — SODIUM CHLORIDE 0.9 % IV BOLUS (SEPSIS)
1000.0000 mL | Freq: Once | INTRAVENOUS | Status: AC
  Administered 2018-02-05: 1000 mL via INTRAVENOUS

## 2018-02-05 MED ORDER — SULFAMETHOXAZOLE-TRIMETHOPRIM 800-160 MG PO TABS: 1.0000 | ORAL_TABLET | Freq: Two times a day (BID) | ORAL | 0 refills | Status: AC

## 2018-02-05 MED ORDER — MORPHINE SULFATE (PF) 4 MG/ML IV SOLN
4.0000 mg | Freq: Once | INTRAVENOUS | Status: AC
  Administered 2018-02-05: 4 mg via INTRAVENOUS
  Filled 2018-02-05: qty 1

## 2018-02-05 MED ORDER — SULFAMETHOXAZOLE-TRIMETHOPRIM 800-160 MG PO TABS
1.0000 | ORAL_TABLET | Freq: Once | ORAL | Status: AC
  Administered 2018-02-05: 1 via ORAL
  Filled 2018-02-05: qty 1

## 2018-02-05 MED ORDER — HYDROMORPHONE HCL 1 MG/ML IJ SOLN
1.0000 mg | Freq: Once | INTRAMUSCULAR | Status: AC
  Administered 2018-02-05: 1 mg via INTRAVENOUS
  Filled 2018-02-05: qty 1

## 2018-02-05 NOTE — ED Provider Notes (Addendum)
[facility] DEPT Provider Note   CSN: [id number] Arrival date & time: 02/05/18  1629     History   Chief Complaint Chief Complaint  Patient presents with  . Headache  . Flu Like Symptoms    HPI Victoria Moore is a 42 y.o. female.  Pt presents to the ED today with cough, congestion, headache.  The pt also c/o abd cramping.  She has no known sick exposures.  She denies n/v.        Past Medical History:  Diagnosis Date  . Allergy   . Anemia   . Anxiety   . Arthritis   . Asthma   . Blood transfusion without reported diagnosis   . Chronic kidney disease   . Common migraine with intractable migraine 10/07/2016  . Depression   . GERD (gastroesophageal reflux disease)   . Hypertension   . Migraine   . Sarcoidosis   . Seizures ([place])    last one year ago    Patient Active Problem List   Diagnosis Date Noted  . Essential hypertension 11/15/2016  . 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 Morbid (severe) obesity due to excess calories ([place]) 10/23/2016  . Cough variant asthma vs UACS  10/23/2016  . Common migraine with intractable migraine 10/07/2016  . Dyspnea 04/15/2016  . Hilar adenopathy 04/15/2016  . Convulsions/seizures ([place]) 03/11/2016  . Fibroids 03/11/2016  . Anemia 11/12/2014  . Symptomatic anemia 11/12/2014  . Menorrhagia 11/12/2014  . Iron deficiency 11/12/2014    Past Surgical History:  Procedure Laterality Date  . ABDOMINAL HYSTERECTOMY    . LAPAROSCOPIC OVARIAN CYSTECTOMY Left 03/11/2016   Procedure: LAPAROSCOPIC OVARIAN CYSTECTOMY;  Surgeon: Eldred Manges, MD;  Location: [facility];  Service: Gynecology;  Laterality: Left;  . LAPAROSCOPIC VAGINAL HYSTERECTOMY WITH SALPINGECTOMY Bilateral 03/11/2016   Procedure: LAPAROSCOPIC ASSISTED VAGINAL HYSTERECTOMY WITH SALPINGECTOMY;  Surgeon: Eldred Manges, MD;  Location: [facility];  Service: Gynecology;  Laterality: Bilateral;  . TUBAL LIGATION      OB History    Gravida  Para Term Preterm AB Living   1             SAB TAB Ectopic Multiple Live Births                   Home Medications    Prior to Admission medications   Medication Sig Start Date End Date Taking? Authorizing Provider  busPIRone (BUSPAR) 7.5 MG tablet Take 1 tablet (7.5 mg total) by mouth 3 (three) times daily. 11/24/17  Yes McClung, Angela M, PA-C  albuterol (PROVENTIL HFA;VENTOLIN HFA) 108 (90 Base) MCG/ACT inhaler Inhale 1-2 puffs into the lungs every 4 (four) hours as needed for wheezing or shortness of breath (cough). 01/06/18   Gildardo Pounds, NP  ALPRAZolam Duanne Moron) 1 MG tablet Take 0.5-1 mg by mouth daily as needed for anxiety. 11/26/17   [provider]  amLODipine (NORVASC) 5 MG tablet Take 1 tablet (5 mg total) by mouth daily. 11/24/17   Argentina Donovan, PA-C  butalbital-acetaminophen-caffeine ([place], ESGIC) [id number] MG tablet Take 1-2 tablets by mouth every 6 (six) hours as needed for headache. 01/06/18 01/06/19  Gildardo Pounds, NP  DEXILANT 60 MG capsule Take 1 capsule (60 mg total) by mouth daily. 01/06/18   Gildardo Pounds, NP  diclofenac (VOLTAREN) 75 MG EC tablet Take 1 tablet (75 mg total) by mouth 2 (two) times daily. 01/06/18   Gildardo Pounds, NP  HYDROcodone-acetaminophen (NORCO/VICODIN) [phone]  MG tablet Take 1 tablet by mouth every 4 (four) hours as needed. 02/05/18   Isla Pence, MD  ibuprofen (ADVIL,MOTRIN) 600 MG tablet Take 1 tablet (600 mg total) by mouth every 6 (six) hours as needed. 01/09/18   Law, Bea Graff, PA-C  metoCLOPramide (REGLAN) 10 MG tablet Take 1 tablet (10 mg total) by mouth every 6 (six) hours as needed for nausea (nausea/headache). Patient not taking: Reported on 11/16/2017 09/23/17   Street, [place], PA-C  mometasone-formoterol (DULERA) 100-5 MCG/ACT AERO Inhale 2 puffs into the lungs 2 (two) times daily. 11/24/17   Argentina Donovan, PA-C  sertraline (ZOLOFT) 100 MG tablet Take 1 tablet (100 mg total) by mouth daily. 01/19/18    Gildardo Pounds, NP  sulfamethoxazole-trimethoprim (BACTRIM DS,SEPTRA DS) 800-160 MG tablet Take 1 tablet by mouth 2 (two) times daily for 7 days. 02/05/18 02/12/18  Isla Pence, MD  topiramate (TOPAMAX) 25 MG tablet Take 2 tablets (50 mg total) by mouth 2 (two) times daily. Patient not taking: Reported on 01/09/2018 02/21/17   Kathrynn Ducking, MD  traZODone (DESYREL) 100 MG tablet Take 1 tablet (100 mg total) by mouth at bedtime. Patient not taking: Reported on 01/09/2018 02/28/17   Kathrynn Ducking, MD  Vitamin D, Ergocalciferol, (DRISDOL) 50000 units CAPS capsule Take 1 capsule (50,000 Units total) by mouth every 7 (seven) days. 11/24/17   Argentina Donovan, PA-C    Family History Family History  Adopted: Yes  Problem Relation Age of Onset  . Other Son        Growing pains  . Asthma Mother   . Heart Problems Mother   . Migraines Mother   . Diabetes Father   . Peptic Ulcer Father   . Heart Problems Father     Social History Social History   Tobacco Use  . Smoking status: Former Smoker    Packs/day: 0.25    Years: 17.00    Pack years: 4.25    Types: Cigarettes    Last attempt to quit: 03/02/2013    Years since quitting: 4.9  . Smokeless tobacco: Never Used  Substance Use Topics  . Alcohol use: No    Alcohol/week: 0.0 oz    Comment: only on special occasions  . Drug use: No    Comment: former - 6 yrs ago     Allergies   Aspirin   Review of Systems Review of Systems  Constitutional: Positive for chills and fever.  HENT: Positive for congestion.   Respiratory: Positive for cough.   Gastrointestinal: Positive for abdominal pain.  Neurological: Positive for headaches.     Physical Exam Updated Vital Signs BP 113/71   Pulse 70   Temp 99.8 F (37.7 C) (Oral)   Resp 16   LMP 02/19/2016   SpO2 100%   Physical Exam  Constitutional: She is oriented to person, place, and time. She appears well-developed and well-nourished.  HENT:  Head: Normocephalic and  atraumatic.  Nose: Rhinorrhea present.  Mouth/Throat: Oropharynx is clear and moist.  Eyes: EOM are normal. Pupils are equal, round, and reactive to light.  Neck: Normal range of motion. Neck supple.  Cardiovascular: Normal rate, regular rhythm, normal heart sounds and intact distal pulses.  Pulmonary/Chest: Effort normal and breath sounds normal.  Abdominal: Soft. Bowel sounds are normal.  Musculoskeletal: Normal range of motion.  Neurological: She is alert and oriented to person, place, and time. She has normal strength.  Skin: Skin is warm and dry. Capillary refill takes less  than 2 seconds.  Psychiatric: She has a normal mood and affect. Her behavior is normal.  Nursing note and vitals reviewed.    ED Treatments / Results  Labs (all labs ordered are listed, but only abnormal results are displayed) Labs Reviewed  COMPREHENSIVE METABOLIC PANEL - Abnormal; Notable for the following components:      Result Value   Glucose, Bld 112 (*)    Creatinine, Ser 1.08 (*)    Calcium 8.7 (*)    ALT 13 (*)    All other components within normal limits  URINALYSIS, ROUTINE W REFLEX MICROSCOPIC - Abnormal; Notable for the following components:   Leukocytes, UA LARGE (*)    Bacteria, UA RARE (*)    Squamous Epithelial / LPF 6-30 (*)    All other components within normal limits  RAPID STREP SCREEN (NOT AT [facility])  CULTURE, GROUP A STREP ([place])  CBC WITH DIFFERENTIAL/PLATELET  INFLUENZA PANEL BY PCR (TYPE A & B)    EKG  EKG Interpretation None       Radiology Dg Chest 2 View  Result Date: 02/05/2018 CLINICAL DATA:  Per order- cough Pt c/o headache, cough, watery eyes, and congestion x3 days. She states that her headache is mostly on the R side and radiates down to her neck. PT HX: asthma, HTN, ex smoker EXAM: CHEST  2 VIEW COMPARISON:  02/17/2017 FINDINGS: Normal mediastinum and cardiac silhouette. Normal pulmonary vasculature. No evidence of effusion, infiltrate, or pneumothorax. No  acute bony abnormality. IMPRESSION: No acute cardiopulmonary process. Electronically Signed   By: Suzy Bouchard M.D.   On: 02/05/2018 18:12    Procedures Procedures (including critical care time)  Medications Ordered in ED Medications  sodium chloride 0.9 % bolus 1,000 mL (0 mLs Intravenous Stopped 02/05/18 2143)  ondansetron (ZOFRAN) injection 4 mg (4 mg Intravenous Given 02/05/18 1811)  morphine 4 MG/ML injection 4 mg (4 mg Intravenous Given 02/05/18 1811)  morphine 4 MG/ML injection 4 mg (4 mg Intravenous Given 02/05/18 1948)  HYDROmorphone (DILAUDID) injection 1 mg (1 mg Intravenous Given 02/05/18 2151)  sulfamethoxazole-trimethoprim (BACTRIM DS,SEPTRA DS) 800-160 MG per tablet 1 tablet (1 tablet Oral Given 02/05/18 2151)     Initial Impression / Assessment and Plan / ED Course  I have reviewed the triage vital signs and the nursing notes.  Pertinent labs & imaging results that were available during my care of the patient were reviewed by me and considered in my medical decision making (see chart for details).     She has been sick for too long for tamiflu to help anyway.  She does have an UTI, so I think I have a source.  I told pt we'd call her if flu came back +.  She is instructed to f/u if worse.  Final Clinical Impressions(s) / ED Diagnoses   Final diagnoses:  Viral upper respiratory tract infection  Lower urinary tract infectious disease  Acute nonintractable headache, unspecified headache type    ED Discharge Orders        Ordered    sulfamethoxazole-trimethoprim (BACTRIM DS,SEPTRA DS) 800-160 MG tablet  2 times daily     02/05/18 2123    HYDROcodone-acetaminophen (NORCO/VICODIN) 5-325 MG tablet  Every 4 hours PRN     02/05/18 2123       Isla Pence, MD 02/05/18 2122    Isla Pence, MD 02/05/18 2157

## 2018-02-05 NOTE — ED Triage Notes (Deleted)
Pt developed pain, headache, fever and feeling bad last night, has fever but has not taken anything for fever. Pt states specifically chest, back, legs hurt, recent visit to [place]

## 2018-02-05 NOTE — ED Triage Notes (Addendum)
Pt c/o headache, cough, watery eyes, and congestion x3 days. She states that her headache is mostly on the R side and radiates down to her neck. Denies N/V.

## 2018-02-08 ENCOUNTER — Ambulatory Visit: Payer: BLUE CROSS/BLUE SHIELD | Attending: Nurse Practitioner | Admitting: Nurse Practitioner

## 2018-02-08 ENCOUNTER — Telehealth: Payer: Self-pay | Admitting: Nurse Practitioner

## 2018-02-08 ENCOUNTER — Encounter: Payer: Self-pay | Admitting: Nurse Practitioner

## 2018-02-08 VITALS — BP 110/70 | HR 62 | Temp 98.4°F | Ht 71.0 in | Wt 308.0 lb

## 2018-02-08 DIAGNOSIS — K219 Gastro-esophageal reflux disease without esophagitis: Secondary | ICD-10-CM | POA: Diagnosis not present

## 2018-02-08 DIAGNOSIS — Z79899 Other long term (current) drug therapy: Secondary | ICD-10-CM | POA: Insufficient documentation

## 2018-02-08 DIAGNOSIS — N189 Chronic kidney disease, unspecified: Secondary | ICD-10-CM | POA: Insufficient documentation

## 2018-02-08 DIAGNOSIS — F329 Major depressive disorder, single episode, unspecified: Secondary | ICD-10-CM | POA: Diagnosis not present

## 2018-02-08 DIAGNOSIS — G4489 Other headache syndrome: Secondary | ICD-10-CM | POA: Insufficient documentation

## 2018-02-08 DIAGNOSIS — F419 Anxiety disorder, unspecified: Secondary | ICD-10-CM | POA: Diagnosis not present

## 2018-02-08 DIAGNOSIS — Z886 Allergy status to analgesic agent status: Secondary | ICD-10-CM | POA: Diagnosis not present

## 2018-02-08 DIAGNOSIS — D869 Sarcoidosis, unspecified: Secondary | ICD-10-CM | POA: Insufficient documentation

## 2018-02-08 DIAGNOSIS — K210 Gastro-esophageal reflux disease with esophagitis: Secondary | ICD-10-CM | POA: Diagnosis not present

## 2018-02-08 DIAGNOSIS — I129 Hypertensive chronic kidney disease with stage 1 through stage 4 chronic kidney disease, or unspecified chronic kidney disease: Secondary | ICD-10-CM | POA: Insufficient documentation

## 2018-02-08 DIAGNOSIS — J45909 Unspecified asthma, uncomplicated: Secondary | ICD-10-CM | POA: Diagnosis not present

## 2018-02-08 LAB — CULTURE, GROUP A STREP (THRC)

## 2018-02-08 MED ORDER — DEXLANSOPRAZOLE 60 MG PO CPDR: 60.0000 mg | DELAYED_RELEASE_CAPSULE | Freq: Every day | ORAL | 3 refills | Status: DC

## 2018-02-08 MED ORDER — KETOROLAC TROMETHAMINE 60 MG/2ML IM SOLN
60.0000 mg | Freq: Once | INTRAMUSCULAR | Status: AC
  Administered 2018-02-08: 60 mg via INTRAMUSCULAR

## 2018-02-08 NOTE — Progress Notes (Signed)
Assessment & Plan:  Victoria Moore was seen today for hospitalization follow-up.  Diagnoses and all orders for this visit:  Essential Hypertension Continue all antihypertensives as prescribed.  Remember to bring in your blood pressure log with you for your follow up appointment.  DASH/Mediterranean Diets are healthier choices for HTN.   Other headache syndrome -     ketorolac (TORADOL) injection 60 mg Get plenty of rest and stay hydrated   Gastroesophageal reflux disease, esophagitis presence not specified -     dexlansoprazole (DEXILANT) 60 MG capsule; Take 1 capsule (60 mg total) by mouth daily. Avoid GERD triggers; caffeine, spicy and acidic foods, chocolate and alcoholic beverages    Patient has been counseled on age-appropriate routine health concerns for screening and prevention. These are reviewed and up-to-date. Referrals have been placed accordingly. Immunizations are up-to-date or declined.    Subjective:   Chief Complaint  Patient presents with  . Hospitalization Follow-up    Patient is here for a hospital follow-up. Patient stated she is feeling worst after th ED visit. Patient stated her botdy aches, face hurt, eat hurt, sore throat, and abdominal cramping.    HPI Victoria Moore 42 y.o. female presents to office today for hospital follow up.   Essential Hypertension Chronic. Blood pressure is well controlled on amlodipine 5mg  daily. Denies chest pain, shortness of breath, palpitations, lightheadedness, dizziness or BLE edema.  BP Readings from Last 3 Encounters:  02/08/18 110/70  02/05/18 113/71  01/09/18 (!) 158/74    URI She was evaluated in the ED on 02-05-2018 with flu like symptoms. She ruled out for strep and influenza but was treated for a UTI with bactrim. Today she endorses no improvement in symptoms. Symptoms include body aches, headache and myalgia. She denies cough, fever, or sinus symptoms.   GERD Chronic. Stable. Symptoms relieved with Dexilant. She  is requesting a refill.     Review of Systems  Constitutional: Positive for malaise/fatigue.       SEE HPI  HENT: Positive for sore throat. Negative for congestion and sinus pain.   Respiratory: Negative for cough, sputum production, shortness of breath and wheezing.   Cardiovascular: Negative for chest pain, orthopnea and leg swelling.  Gastrointestinal: Positive for heartburn.  Musculoskeletal: Positive for myalgias.  Neurological: Positive for headaches. Negative for dizziness and tingling.    Past Medical History:  Diagnosis Date  . Allergy   . Anemia   . Anxiety   . Arthritis   . Asthma   . Blood transfusion without reported diagnosis   . Chronic kidney disease   . Common migraine with intractable migraine 10/07/2016  . Depression   . GERD (gastroesophageal reflux disease)   . Hypertension   . Migraine   . Sarcoidosis   . Seizures ([place])    last one year ago    Past Surgical History:  Procedure Laterality Date  . ABDOMINAL HYSTERECTOMY    . LAPAROSCOPIC OVARIAN CYSTECTOMY Left 03/11/2016   Procedure: LAPAROSCOPIC OVARIAN CYSTECTOMY;  Surgeon: Eldred Manges, MD;  Location: [facility];  Service: Gynecology;  Laterality: Left;  . LAPAROSCOPIC VAGINAL HYSTERECTOMY WITH SALPINGECTOMY Bilateral 03/11/2016   Procedure: LAPAROSCOPIC ASSISTED VAGINAL HYSTERECTOMY WITH SALPINGECTOMY;  Surgeon: Eldred Manges, MD;  Location: [facility];  Service: Gynecology;  Laterality: Bilateral;  . TUBAL LIGATION      Family History  Adopted: Yes  Problem Relation Age of Onset  . Other Son        Growing pains  . Asthma Mother   .  Heart Problems Mother   . Migraines Mother   . Diabetes Father   . Peptic Ulcer Father   . Heart Problems Father     Social History Reviewed with no changes to be made today.   Outpatient Medications Prior to Visit  Medication Sig Dispense Refill  . albuterol (PROVENTIL HFA;VENTOLIN HFA) 108 (90 Base) MCG/ACT inhaler Inhale 1-2 puffs into the lungs  every 4 (four) hours as needed for wheezing or shortness of breath (cough). 1 Inhaler 1  . amLODipine (NORVASC) 5 MG tablet Take 1 tablet (5 mg total) by mouth daily. 90 tablet 3  . busPIRone (BUSPAR) 7.5 MG tablet Take 1 tablet (7.5 mg total) by mouth 3 (three) times daily. 90 tablet 3  . butalbital-acetaminophen-caffeine (FIORICET, ESGIC) 50-325-40 MG tablet Take 1-2 tablets by mouth every 6 (six) hours as needed for headache. 20 tablet 0  . diclofenac (VOLTAREN) 75 MG EC tablet Take 1 tablet (75 mg total) by mouth 2 (two) times daily. 30 tablet 1  . HYDROcodone-acetaminophen (NORCO/VICODIN) 5-325 MG tablet Take 1 tablet by mouth every 4 (four) hours as needed. 10 tablet 0  . ibuprofen (ADVIL,MOTRIN) 600 MG tablet Take 1 tablet (600 mg total) by mouth every 6 (six) hours as needed. 30 tablet 0  . mometasone-formoterol (DULERA) 100-5 MCG/ACT AERO Inhale 2 puffs into the lungs 2 (two) times daily. 1 Inhaler 5  . sertraline (ZOLOFT) 100 MG tablet Take 1 tablet (100 mg total) by mouth daily. 90 tablet 0  . sulfamethoxazole-trimethoprim (BACTRIM DS,SEPTRA DS) 800-160 MG tablet Take 1 tablet by mouth 2 (two) times daily for 7 days. 14 tablet 0  . ALPRAZolam (XANAX) 1 MG tablet Take 0.5-1 mg by mouth daily as needed for anxiety.  0  . metoCLOPramide (REGLAN) 10 MG tablet Take 1 tablet (10 mg total) by mouth every 6 (six) hours as needed for nausea (nausea/headache). (Patient not taking: Reported on 11/16/2017) 12 tablet 0  . topiramate (TOPAMAX) 25 MG tablet Take 2 tablets (50 mg total) by mouth 2 (two) times daily. (Patient not taking: Reported on 01/09/2018) 120 tablet 3  . traZODone (DESYREL) 100 MG tablet Take 1 tablet (100 mg total) by mouth at bedtime. (Patient not taking: Reported on 01/09/2018) 30 tablet 1  . Vitamin D, Ergocalciferol, (DRISDOL) 50000 units CAPS capsule Take 1 capsule (50,000 Units total) by mouth every 7 (seven) days. (Patient not taking: Reported on 02/08/2018) 16 capsule 0  .  DEXILANT 60 MG capsule Take 1 capsule (60 mg total) by mouth daily. (Patient not taking: Reported on 02/08/2018) 30 capsule 6   No facility-administered medications prior to visit.     Allergies  Allergen Reactions  . Aspirin Anaphylaxis and Hives            Objective:    BP 110/70 (BP Location: Left Arm, Patient Position: Sitting, Cuff Size: Large)   Pulse 62   Temp 98.4 F (36.9 C) (Oral)   Ht 5\' 11"  (1.803 m)   Wt (!) 308 lb (139.7 kg)   LMP 02/19/2016   SpO2 98%   BMI 42.96 kg/m  Wt Readings from Last 3 Encounters:  02/08/18 (!) 308 lb (139.7 kg)  01/06/18 (!) 309 lb 9.6 oz (140.4 kg)  11/24/17 (!) 303 lb 9.6 oz (137.7 kg)    Physical Exam  Constitutional: She is oriented to person, place, and time. She appears well-developed and well-nourished. She is cooperative.  HENT:  Head: Normocephalic and atraumatic.  Eyes: EOM are normal.  Neck: Normal range of motion. No thyromegaly present.  Cardiovascular: Normal rate, regular rhythm and normal heart sounds. Exam reveals no gallop and no friction rub.  No murmur heard. Pulmonary/Chest: Effort normal and breath sounds normal. No tachypnea. No respiratory distress. She has no decreased breath sounds. She has no wheezes. She has no rhonchi. She has no rales. She exhibits no tenderness.  Abdominal: Soft. Bowel sounds are normal. There is no tenderness.  Musculoskeletal: Normal range of motion. She exhibits no edema.  Lymphadenopathy:    She has no cervical adenopathy.  Neurological: She is alert and oriented to person, place, and time. Coordination normal.  Skin: Skin is warm and dry.  Psychiatric: She has a normal mood and affect. Her behavior is normal. Judgment and thought content normal.  Nursing note and vitals reviewed.        Patient has been counseled extensively about nutrition and exercise as well as the importance of adherence with medications and regular follow-up. The patient was given clear instructions to  go to ER or return to medical center if symptoms don't improve, worsen or new problems develop. The patient verbalized understanding.   Follow-up: Return if symptoms worsen or fail to improve.   Gildardo Pounds, FNP-BC [facility], [place]   02/12/2018, 6:27 PM

## 2018-02-08 NOTE — Telephone Encounter (Signed)
Will route to PCP.  Patient was told she will be refer to gastro for her medication Dexilant.

## 2018-02-08 NOTE — Telephone Encounter (Signed)
Pt. Called stating she went to the pharmacy and was told that she needed a referral to the gastro. Please f/u

## 2018-02-08 NOTE — Patient Instructions (Addendum)
Viral Illness, Adult Viruses are tiny germs that can get into a person's body and cause illness. There are many different types of viruses, and they cause many types of illness. Viral illnesses can range from mild to severe. They can affect various parts of the body. Common illnesses that are caused by a virus include colds and the flu. Viral illnesses also include serious conditions such as HIV/AIDS (human immunodeficiency virus/acquired immunodeficiency syndrome). A few viruses have been linked to certain cancers. What are the causes? Many types of viruses can cause illness. Viruses invade cells in your body, multiply, and cause the infected cells to malfunction or die. When the cell dies, it releases more of the virus. When this happens, you develop symptoms of the illness, and the virus continues to spread to other cells. If the virus takes over the function of the cell, it can cause the cell to divide and grow out of control, as is the case when a virus causes cancer. Different viruses get into the body in different ways. You can get a virus by:  Swallowing food or water that is contaminated with the virus.  Breathing in droplets that have been coughed or sneezed into the air by an infected person.  Touching a surface that has been contaminated with the virus and then touching your eyes, nose, or mouth.  Being bitten by an insect or animal that carries the virus.  Having sexual contact with a person who is infected with the virus.  Being exposed to blood or fluids that contain the virus, either through an open cut or during a transfusion.  If a virus enters your body, your body's defense system (immune system) will try to fight the virus. You may be at higher risk for a viral illness if your immune system is weak. What are the signs or symptoms? Symptoms vary depending on the type of virus and the location of the cells that it invades. Common symptoms of the main types of viral illnesses  include: Cold and flu viruses  Fever.  Headache.  Sore throat.  Muscle aches.  Nasal congestion.  Cough. Digestive system (gastrointestinal) viruses  Fever.  Abdominal pain.  Nausea.  Diarrhea. Liver viruses (hepatitis)  Loss of appetite.  Tiredness.  Yellowing of the skin (jaundice). Brain and spinal cord viruses  Fever.  Headache.  Stiff neck.  Nausea and vomiting.  Confusion or sleepiness. Skin viruses  Warts.  Itching.  Rash. Sexually transmitted viruses  Discharge.  Swelling.  Redness.  Rash. How is this treated? Viruses can be difficult to treat because they live within cells. Antibiotic medicines do not treat viruses because these drugs do not get inside cells. Treatment for a viral illness may include:  Resting and drinking plenty of fluids.  Medicines to relieve symptoms. These can include over-the-counter medicine for pain and fever, medicines for cough or congestion, and medicines to relieve diarrhea.  Antiviral medicines. These drugs are available only for certain types of viruses. They may help reduce flu symptoms if taken early. There are also many antiviral medicines for hepatitis and HIV/AIDS.  Some viral illnesses can be prevented with vaccinations. A common example is the flu shot. Follow these instructions at home: Medicines   Take over-the-counter and prescription medicines only as told by your health care provider.  If you were prescribed an antiviral medicine, take it as told by your health care provider. Do not stop taking the medicine even if you start to feel better.  Be aware   of when antibiotics are needed and when they are not needed. Antibiotics do not treat viruses. If your health care provider thinks that you may have a bacterial infection as well as a viral infection, you may get an antibiotic. ? Do not ask for an antibiotic prescription if you have been diagnosed with a viral illness. That will not make your  illness go away faster. ? Frequently taking antibiotics when they are not needed can lead to antibiotic resistance. When this develops, the medicine no longer works against the bacteria that it normally fights. General instructions  Drink enough fluids to keep your urine clear or pale yellow.  Rest as much as possible.  Return to your normal activities as told by your health care provider. Ask your health care provider what activities are safe for you.  Keep all follow-up visits as told by your health care provider. This is important. How is this prevented? Take these actions to reduce your risk of viral infection:  Eat a healthy diet and get enough rest.  Wash your hands often with soap and water. This is especially important when you are in public places. If soap and water are not available, use hand sanitizer.  Avoid close contact with friends and family who have a viral illness.  If you travel to areas where viral gastrointestinal infection is common, avoid drinking water or eating raw food.  Keep your immunizations up to date. Get a flu shot every year as told by your health care provider.  Do not share toothbrushes, nail clippers, razors, or needles with other people.  Always practice safe sex.  Contact a health care provider if:  You have symptoms of a viral illness that do not go away.  Your symptoms come back after going away.  Your symptoms get worse. Get help right away if:  You have trouble breathing.  You have a severe headache or a stiff neck.  You have severe vomiting or abdominal pain. This information is not intended to replace advice given to you by your health care provider. Make sure you discuss any questions you have with your health care provider. Document Released: 04/23/2016 Document Revised: 05/26/2016 Document Reviewed: 04/23/2016 Elsevier Interactive Patient Education  2018 Elsevier Inc.  

## 2018-02-09 ENCOUNTER — Encounter: Payer: Self-pay | Admitting: Nurse Practitioner

## 2018-02-09 NOTE — Telephone Encounter (Signed)
CMA spoke to patient, and patient said she is willing to try Protonix. She would like to have the medication send to [facility] pharmacy.

## 2018-02-09 NOTE — Telephone Encounter (Signed)
Please ask patient if she would like to try protonix

## 2018-02-10 ENCOUNTER — Other Ambulatory Visit: Payer: Self-pay | Admitting: Nurse Practitioner

## 2018-02-10 MED ORDER — PANTOPRAZOLE SODIUM 40 MG PO TBEC: 40.0000 mg | DELAYED_RELEASE_TABLET | Freq: Every day | ORAL | 1 refills | Status: DC

## 2018-02-10 NOTE — Telephone Encounter (Signed)
Script has been sent.

## 2018-02-12 ENCOUNTER — Encounter: Payer: Self-pay | Admitting: Nurse Practitioner

## 2018-02-21 ENCOUNTER — Telehealth: Payer: Self-pay | Admitting: Nurse Practitioner

## 2018-02-21 NOTE — Telephone Encounter (Signed)
2 page, paperwork received 02/21/18.

## 2018-02-23 ENCOUNTER — Ambulatory Visit (HOSPITAL_COMMUNITY)
Admission: EM | Admit: 2018-02-23 | Discharge: 2018-02-23 | Disposition: A | Discharge status: Home / Self Care | Payer: BLUE CROSS/BLUE SHIELD | Attending: Urgent Care | Admitting: Urgent Care

## 2018-02-23 ENCOUNTER — Other Ambulatory Visit: Payer: Self-pay

## 2018-02-23 ENCOUNTER — Emergency Department (HOSPITAL_COMMUNITY)
Admission: EM | Admit: 2018-02-23 | Discharge: 2018-02-23 | Disposition: A | Discharge status: Home / Self Care | Payer: BLUE CROSS/BLUE SHIELD | Source: Home / Self Care

## 2018-02-23 ENCOUNTER — Encounter (HOSPITAL_COMMUNITY): Payer: Self-pay | Admitting: Emergency Medicine

## 2018-02-23 DIAGNOSIS — I129 Hypertensive chronic kidney disease with stage 1 through stage 4 chronic kidney disease, or unspecified chronic kidney disease: Secondary | ICD-10-CM | POA: Insufficient documentation

## 2018-02-23 DIAGNOSIS — Z79899 Other long term (current) drug therapy: Secondary | ICD-10-CM | POA: Insufficient documentation

## 2018-02-23 DIAGNOSIS — D649 Anemia, unspecified: Secondary | ICD-10-CM | POA: Diagnosis not present

## 2018-02-23 DIAGNOSIS — F329 Major depressive disorder, single episode, unspecified: Secondary | ICD-10-CM | POA: Insufficient documentation

## 2018-02-23 DIAGNOSIS — R42 Dizziness and giddiness: Secondary | ICD-10-CM | POA: Diagnosis not present

## 2018-02-23 DIAGNOSIS — J45909 Unspecified asthma, uncomplicated: Secondary | ICD-10-CM | POA: Diagnosis not present

## 2018-02-23 DIAGNOSIS — H53149 Visual discomfort, unspecified: Secondary | ICD-10-CM

## 2018-02-23 DIAGNOSIS — N189 Chronic kidney disease, unspecified: Secondary | ICD-10-CM | POA: Diagnosis not present

## 2018-02-23 DIAGNOSIS — G43909 Migraine, unspecified, not intractable, without status migrainosus: Secondary | ICD-10-CM

## 2018-02-23 DIAGNOSIS — D869 Sarcoidosis, unspecified: Secondary | ICD-10-CM | POA: Insufficient documentation

## 2018-02-23 DIAGNOSIS — G4700 Insomnia, unspecified: Secondary | ICD-10-CM | POA: Diagnosis not present

## 2018-02-23 DIAGNOSIS — Z5321 Procedure and treatment not carried out due to patient leaving prior to being seen by health care provider: Secondary | ICD-10-CM | POA: Insufficient documentation

## 2018-02-23 DIAGNOSIS — K219 Gastro-esophageal reflux disease without esophagitis: Secondary | ICD-10-CM | POA: Diagnosis not present

## 2018-02-23 DIAGNOSIS — R51 Headache: Secondary | ICD-10-CM | POA: Diagnosis present

## 2018-02-23 LAB — POCT URINALYSIS DIP (DEVICE)
Bilirubin Urine: NEGATIVE
Glucose, UA: NEGATIVE mg/dL
Hgb urine dipstick: NEGATIVE
Nitrite: NEGATIVE
Protein, ur: NEGATIVE mg/dL
Specific Gravity, Urine: >=1.03 (ref 1.005–1.030)
Urobilinogen, UA: 0.2 mg/dL (ref 0.0–1.0)
pH: 5.5 (ref 5.0–8.0)

## 2018-02-23 MED ORDER — KETOROLAC TROMETHAMINE 60 MG/2ML IM SOLN
INTRAMUSCULAR | Status: AC
  Filled 2018-02-23: qty 2

## 2018-02-23 MED ORDER — SUMATRIPTAN SUCCINATE 50 MG PO TABS: 50.0000 mg | ORAL_TABLET | Freq: Every day | ORAL | 0 refills | Status: DC | PRN

## 2018-02-23 MED ORDER — HYDROXYZINE PAMOATE 50 MG PO CAPS: 50.0000 mg | ORAL_CAPSULE | Freq: Every evening | ORAL | 1 refills | Status: DC | PRN

## 2018-02-23 MED ORDER — KETOROLAC TROMETHAMINE 60 MG/2ML IM SOLN
60.0000 mg | Freq: Once | INTRAMUSCULAR | Status: AC
  Administered 2018-02-23: 60 mg via INTRAMUSCULAR

## 2018-02-23 NOTE — ED Triage Notes (Signed)
Patient c/o headache x1 month. Hx migraines. Patient also c/o lower back pain x3 weeks. Denies urinary symptoms and injury. Ambulatory.

## 2018-02-23 NOTE — ED Provider Notes (Addendum)
MRN: [id number] DOB: 03-08-1976  Subjective:   Victoria Moore is a 42 y.o. female presenting for 3 week history of persistent headaches. Headaches are frontal, constant, sharp, alternates between right and left sides but can occur over entire forehead. Has associated photophobia, lightheadedness. Hydrates with 8 bottles of water daily. Denies fever, sinus congestion, sinus pain, ear pain, ear drainage, sore throat, cough, weakness, numbness or tingling. Denies smoking cigarettes. Has a history of migraines. Is not on anything for this currently. Her PCP is Dr. Raul Del, has helped patient with migraines. Used to take Topomax, Reglan. Has not previously tried Imitrex. Has a history of hives with aspirin but has done okay with Toradol in the past. Has seasonal allergies, managed with Zyrtec. Has difficulty sleeping, does not sleep enough, gets ~2 hours per night. Has tried Trazodone but has not helped. Does not eat regular meals, eat healthy foods.  No current facility-administered medications for this encounter.   Current Outpatient Medications:  .  albuterol (PROVENTIL HFA;VENTOLIN HFA) 108 (90 Base) MCG/ACT inhaler, Inhale 1-2 puffs into the lungs every 4 (four) hours as needed for wheezing or shortness of breath (cough)., Disp: 1 Inhaler, Rfl: 1 .  ALPRAZolam (XANAX) 1 MG tablet, Take 0.5-1 mg by mouth daily as needed for anxiety., Disp: , Rfl: 0 .  amLODipine (NORVASC) 5 MG tablet, Take 1 tablet (5 mg total) by mouth daily., Disp: 90 tablet, Rfl: 3 .  busPIRone (BUSPAR) 7.5 MG tablet, Take 1 tablet (7.5 mg total) by mouth 3 (three) times daily., Disp: 90 tablet, Rfl: 3 .  butalbital-acetaminophen-caffeine (FIORICET, ESGIC) 50-325-40 MG tablet, Take 1-2 tablets by mouth every 6 (six) hours as needed for headache., Disp: 20 tablet, Rfl: 0 .  dexlansoprazole (DEXILANT) 60 MG capsule, Take 1 capsule (60 mg total) by mouth daily., Disp: 30 capsule, Rfl: 3 .  diclofenac (VOLTAREN) 75 MG EC tablet, Take 1  tablet (75 mg total) by mouth 2 (two) times daily., Disp: 30 tablet, Rfl: 1 .  HYDROcodone-acetaminophen (NORCO/VICODIN) 5-325 MG tablet, Take 1 tablet by mouth every 4 (four) hours as needed., Disp: 10 tablet, Rfl: 0 .  ibuprofen (ADVIL,MOTRIN) 600 MG tablet, Take 1 tablet (600 mg total) by mouth every 6 (six) hours as needed., Disp: 30 tablet, Rfl: 0 .  metoCLOPramide (REGLAN) 10 MG tablet, Take 1 tablet (10 mg total) by mouth every 6 (six) hours as needed for nausea (nausea/headache). (Patient not taking: Reported on 11/16/2017), Disp: 12 tablet, Rfl: 0 .  mometasone-formoterol (DULERA) 100-5 MCG/ACT AERO, Inhale 2 puffs into the lungs 2 (two) times daily., Disp: 1 Inhaler, Rfl: 5 .  pantoprazole (PROTONIX) 40 MG tablet, Take 1 tablet (40 mg total) by mouth daily., Disp: 30 tablet, Rfl: 1 .  sertraline (ZOLOFT) 100 MG tablet, Take 1 tablet (100 mg total) by mouth daily., Disp: 90 tablet, Rfl: 0 .  topiramate (TOPAMAX) 25 MG tablet, Take 2 tablets (50 mg total) by mouth 2 (two) times daily. (Patient not taking: Reported on 01/09/2018), Disp: 120 tablet, Rfl: 3 .  traZODone (DESYREL) 100 MG tablet, Take 1 tablet (100 mg total) by mouth at bedtime. (Patient not taking: Reported on 01/09/2018), Disp: 30 tablet, Rfl: 1 .  Vitamin D, Ergocalciferol, (DRISDOL) 50000 units CAPS capsule, Take 1 capsule (50,000 Units total) by mouth every 7 (seven) days. (Patient not taking: Reported on 02/08/2018), Disp: 16 capsule, Rfl: 0   Victoria Moore is allergic to aspirin.  Victoria Moore  has a past medical history of Allergy, Anemia, Anxiety, Arthritis, Asthma,  Blood transfusion without reported diagnosis, Chronic kidney disease, Common migraine with intractable migraine (10/07/2016), Depression, GERD (gastroesophageal reflux disease), Hypertension, Migraine, Sarcoidosis, and Seizures ([place]). Also  has a past surgical history that includes Tubal ligation; Laparoscopic vaginal hysterectomy with salpingectomy (Bilateral, 03/11/2016);  Laparoscopic ovarian cystectomy (Left, 03/11/2016); and Abdominal hysterectomy.  Objective:   Vitals: BP 133/79   Pulse 70   Temp 98.4 F (36.9 C)   Resp 18   LMP 02/19/2016   SpO2 100%   BP Readings from Last 3 Encounters:  02/23/18 133/79  02/08/18 110/70  02/05/18 113/71    Physical Exam  Constitutional: She is oriented to person, place, and time. She appears well-developed and well-nourished.  HENT:  Mouth/Throat: Oropharynx is clear and moist.  TM's intact and without erythema.  Eyes: EOM are normal. Pupils are equal, round, and reactive to light.  Neck: Normal range of motion. Neck supple.  Cardiovascular: Normal rate, regular rhythm and intact distal pulses. Exam reveals no gallop and no friction rub.  No murmur heard. Pulmonary/Chest: No respiratory distress. She has no wheezes. She has no rales.  Neurological: She is alert and oriented to person, place, and time. She displays normal reflexes. No cranial nerve deficit. Coordination normal.  Skin: Skin is warm and dry.  Psychiatric: She has a normal mood and affect.   Results for orders placed or performed during the hospital encounter of 02/23/18 (from the past 24 hour(s))  POCT urinalysis dip (device)     Status: Abnormal   Collection Time: 02/23/18  6:34 PM  Result Value Ref Range   Glucose, UA NEGATIVE NEGATIVE mg/dL   Bilirubin Urine NEGATIVE NEGATIVE   Ketones, ur TRACE (A) NEGATIVE mg/dL   Specific Gravity, Urine >=1.030 1.005 - 1.030   Hgb urine dipstick NEGATIVE NEGATIVE   pH 5.5 5.0 - 8.0   Protein, ur NEGATIVE NEGATIVE mg/dL   Urobilinogen, UA 0.2 0.0 - 1.0 mg/dL   Nitrite NEGATIVE NEGATIVE   Leukocytes, UA TRACE (A) NEGATIVE   Assessment and Plan :   Migraine without status migrainosus, not intractable, unspecified migraine type  Dizziness  Photophobia  Insomnia, unspecified type  Toradol injection in clinic. Counseled patient on sources of headaches. Will try Imitrex to address migraines.  Use Zyrtec to control allergies, hydroxyzine for insomnia. Counseled patient on potential for adverse effects with medications prescribed today, patient verbalized understanding. Follow up with PCP. ER and return-to-clinic precautions discussed, patient verbalized understanding.     Victoria Moore, [place] 02/23/18 [id number]

## 2018-02-23 NOTE — ED Notes (Signed)
Per Jenny Reichmann @ [facility] pt is there and trying to check in. Discharge chart.

## 2018-02-23 NOTE — ED Triage Notes (Signed)
Pt states she c/o headache, hx of migraines states "toradol doesn't work".

## 2018-02-23 NOTE — ED Triage Notes (Signed)
Pt states "last time they had to give me morphine several times".

## 2018-02-25 LAB — URINE CULTURE: Culture: NO GROWTH

## 2018-02-27 NOTE — Telephone Encounter (Signed)
Patient mychart concern.

## 2018-02-27 NOTE — Telephone Encounter (Signed)
Halle. Do you have the paperwork? I have not seen it.

## 2018-05-15 ENCOUNTER — Emergency Department (HOSPITAL_COMMUNITY)
Admission: EM | Admit: 2018-05-15 | Discharge: 2018-05-16 | Disposition: A | Discharge status: Home / Self Care | Payer: BLUE CROSS/BLUE SHIELD | Attending: Emergency Medicine | Admitting: Emergency Medicine

## 2018-05-15 ENCOUNTER — Other Ambulatory Visit: Payer: Self-pay

## 2018-05-15 ENCOUNTER — Encounter (HOSPITAL_COMMUNITY): Payer: Self-pay | Admitting: Emergency Medicine

## 2018-05-15 ENCOUNTER — Emergency Department (HOSPITAL_COMMUNITY): Payer: BLUE CROSS/BLUE SHIELD

## 2018-05-15 DIAGNOSIS — R079 Chest pain, unspecified: Secondary | ICD-10-CM | POA: Insufficient documentation

## 2018-05-15 DIAGNOSIS — Z5321 Procedure and treatment not carried out due to patient leaving prior to being seen by health care provider: Secondary | ICD-10-CM | POA: Diagnosis not present

## 2018-05-15 LAB — I-STAT TROPONIN, ED: Troponin i, poc: 0 ng/mL (ref 0.00–0.08)

## 2018-05-15 LAB — BASIC METABOLIC PANEL
Anion gap: 9 (ref 5–15)
BUN: 9 mg/dL (ref 6–20)
CO2: 25 mmol/L (ref 22–32)
Calcium: 8.7 mg/dL — ABNORMAL LOW (ref 8.9–10.3)
Chloride: 107 mmol/L (ref 101–111)
Creatinine, Ser: 1.02 mg/dL — ABNORMAL HIGH (ref 0.44–1.00)
GFR calc Af Amer: >60 mL/min (ref >60)
GFR calc non Af Amer: >60 mL/min (ref >60)
Glucose, Bld: 134 mg/dL — ABNORMAL HIGH (ref 65–99)
Potassium: 3.6 mmol/L (ref 3.5–5.1)
Sodium: 141 mmol/L (ref 135–145)

## 2018-05-15 LAB — CBC
HCT: 36.3 % (ref 36.0–46.0)
Hemoglobin: 11.3 g/dL — ABNORMAL LOW (ref 12.0–15.0)
MCH: 26.7 pg (ref 26.0–34.0)
MCHC: 31.1 g/dL (ref 30.0–36.0)
MCV: 85.8 fL (ref 78.0–100.0)
Platelets: 210 10*3/uL (ref 150–400)
RBC: 4.23 MIL/uL (ref 3.87–5.11)
RDW: 13.5 % (ref 11.5–15.5)
WBC: 5.3 10*3/uL (ref 4.0–10.5)

## 2018-05-15 LAB — I-STAT BETA HCG BLOOD, ED (MC, WL, AP ONLY): I-stat hCG, quantitative: <5 m[IU]/mL (ref <5)

## 2018-05-15 NOTE — ED Triage Notes (Signed)
Patient here with multiple complaints, states she is having pain in right lower leg that shoots up the leg.  She states that she is also having chest pain, discoloration of her feet.  She denies any injury to the area.  Mild shortness of breath, mostly at night, edema noted to bilateral ankles.

## 2018-05-15 NOTE — ED Notes (Signed)
Results reviewed.  No changes in acuity at this time 

## 2018-05-16 NOTE — ED Notes (Signed)
Called for vitals no response

## 2018-05-16 NOTE — ED Notes (Signed)
No answer for VS recheck x2. Presumed to have left without being seen after triage.

## 2018-05-20 ENCOUNTER — Other Ambulatory Visit: Payer: Self-pay

## 2018-05-20 ENCOUNTER — Emergency Department (HOSPITAL_COMMUNITY)
Admission: EM | Admit: 2018-05-20 | Discharge: 2018-05-21 | Disposition: A | Discharge status: Home / Self Care | Payer: BLUE CROSS/BLUE SHIELD | Attending: Emergency Medicine | Admitting: Emergency Medicine

## 2018-05-20 ENCOUNTER — Encounter (HOSPITAL_COMMUNITY): Payer: Self-pay | Admitting: Emergency Medicine

## 2018-05-20 ENCOUNTER — Emergency Department (HOSPITAL_COMMUNITY): Payer: BLUE CROSS/BLUE SHIELD

## 2018-05-20 DIAGNOSIS — R609 Edema, unspecified: Secondary | ICD-10-CM | POA: Diagnosis not present

## 2018-05-20 DIAGNOSIS — G43909 Migraine, unspecified, not intractable, without status migrainosus: Secondary | ICD-10-CM | POA: Insufficient documentation

## 2018-05-20 DIAGNOSIS — R0789 Other chest pain: Secondary | ICD-10-CM | POA: Diagnosis present

## 2018-05-20 DIAGNOSIS — J45909 Unspecified asthma, uncomplicated: Secondary | ICD-10-CM | POA: Insufficient documentation

## 2018-05-20 DIAGNOSIS — I129 Hypertensive chronic kidney disease with stage 1 through stage 4 chronic kidney disease, or unspecified chronic kidney disease: Secondary | ICD-10-CM | POA: Insufficient documentation

## 2018-05-20 DIAGNOSIS — N189 Chronic kidney disease, unspecified: Secondary | ICD-10-CM | POA: Insufficient documentation

## 2018-05-20 DIAGNOSIS — Z79899 Other long term (current) drug therapy: Secondary | ICD-10-CM | POA: Diagnosis not present

## 2018-05-20 DIAGNOSIS — M79662 Pain in left lower leg: Secondary | ICD-10-CM | POA: Diagnosis not present

## 2018-05-20 DIAGNOSIS — D649 Anemia, unspecified: Secondary | ICD-10-CM | POA: Diagnosis not present

## 2018-05-20 DIAGNOSIS — M79661 Pain in right lower leg: Secondary | ICD-10-CM | POA: Diagnosis not present

## 2018-05-20 DIAGNOSIS — G43019 Migraine without aura, intractable, without status migrainosus: Secondary | ICD-10-CM

## 2018-05-20 DIAGNOSIS — Z87891 Personal history of nicotine dependence: Secondary | ICD-10-CM | POA: Diagnosis not present

## 2018-05-20 DIAGNOSIS — N390 Urinary tract infection, site not specified: Secondary | ICD-10-CM | POA: Diagnosis not present

## 2018-05-20 LAB — BASIC METABOLIC PANEL
Anion gap: 9 (ref 5–15)
BUN: 13 mg/dL (ref 6–20)
CO2: 24 mmol/L (ref 22–32)
Calcium: 8.7 mg/dL — ABNORMAL LOW (ref 8.9–10.3)
Chloride: 107 mmol/L (ref 101–111)
Creatinine, Ser: 0.92 mg/dL (ref 0.44–1.00)
GFR calc Af Amer: >60 mL/min (ref >60)
GFR calc non Af Amer: >60 mL/min (ref >60)
Glucose, Bld: 116 mg/dL — ABNORMAL HIGH (ref 65–99)
Potassium: 3.6 mmol/L (ref 3.5–5.1)
Sodium: 140 mmol/L (ref 135–145)

## 2018-05-20 LAB — CBC
HCT: 34.4 % — ABNORMAL LOW (ref 36.0–46.0)
Hemoglobin: 11.1 g/dL — ABNORMAL LOW (ref 12.0–15.0)
MCH: 27.5 pg (ref 26.0–34.0)
MCHC: 32.3 g/dL (ref 30.0–36.0)
MCV: 85.1 fL (ref 78.0–100.0)
Platelets: 218 10*3/uL (ref 150–400)
RBC: 4.04 MIL/uL (ref 3.87–5.11)
RDW: 13.7 % (ref 11.5–15.5)
WBC: 5.4 10*3/uL (ref 4.0–10.5)

## 2018-05-20 LAB — I-STAT TROPONIN, ED: Troponin i, poc: 0 ng/mL (ref 0.00–0.08)

## 2018-05-20 LAB — POC URINE PREG, ED: Preg Test, Ur: NEGATIVE

## 2018-05-20 NOTE — ED Triage Notes (Signed)
Pt presents with multiple complaints including dizziness, chest pain, burning with urination and leg pain for the last week.

## 2018-05-21 ENCOUNTER — Emergency Department (HOSPITAL_BASED_OUTPATIENT_CLINIC_OR_DEPARTMENT_OTHER)
Admit: 2018-05-21 | Discharge: 2018-05-21 | Disposition: A | Discharge status: Home / Self Care | Payer: BLUE CROSS/BLUE SHIELD | Attending: Emergency Medicine | Admitting: Emergency Medicine

## 2018-05-21 DIAGNOSIS — M79609 Pain in unspecified limb: Secondary | ICD-10-CM

## 2018-05-21 LAB — URINALYSIS, ROUTINE W REFLEX MICROSCOPIC
Bilirubin Urine: NEGATIVE
Glucose, UA: NEGATIVE mg/dL
Hgb urine dipstick: NEGATIVE
Ketones, ur: NEGATIVE mg/dL
Nitrite: POSITIVE — AB
Protein, ur: NEGATIVE mg/dL
Specific Gravity, Urine: 1.026 (ref 1.005–1.030)
pH: 5 (ref 5.0–8.0)

## 2018-05-21 LAB — HEPATIC FUNCTION PANEL
ALT: 11 U/L — ABNORMAL LOW (ref 14–54)
AST: 18 U/L (ref 15–41)
Albumin: 3.7 g/dL (ref 3.5–5.0)
Alkaline Phosphatase: 56 U/L (ref 38–126)
Bilirubin, Direct: 0.1 mg/dL (ref 0.1–0.5)
Indirect Bilirubin: 0.3 mg/dL (ref 0.3–0.9)
Total Bilirubin: 0.4 mg/dL (ref 0.3–1.2)
Total Protein: 7.1 g/dL (ref 6.5–8.1)

## 2018-05-21 LAB — I-STAT TROPONIN, ED: Troponin i, poc: 0 ng/mL (ref 0.00–0.08)

## 2018-05-21 LAB — D-DIMER, QUANTITATIVE: D-Dimer, Quant: 0.3 ug/mL-FEU (ref 0.00–0.50)

## 2018-05-21 MED ORDER — METOCLOPRAMIDE HCL 5 MG/ML IJ SOLN
10.0000 mg | Freq: Once | INTRAMUSCULAR | Status: AC
  Administered 2018-05-21: 10 mg via INTRAVENOUS
  Filled 2018-05-21: qty 2

## 2018-05-21 MED ORDER — NITROFURANTOIN MONOHYD MACRO 100 MG PO CAPS: 100.0000 mg | ORAL_CAPSULE | Freq: Two times a day (BID) | ORAL | 0 refills | Status: DC

## 2018-05-21 MED ORDER — DEXAMETHASONE 4 MG PO TABS
10.0000 mg | ORAL_TABLET | Freq: Once | ORAL | Status: AC
  Administered 2018-05-21: 10 mg via ORAL
  Filled 2018-05-21: qty 2

## 2018-05-21 MED ORDER — NITROFURANTOIN MONOHYD MACRO 100 MG PO CAPS
100.0000 mg | ORAL_CAPSULE | Freq: Once | ORAL | Status: AC
  Administered 2018-05-21: 100 mg via ORAL
  Filled 2018-05-21: qty 1

## 2018-05-21 MED ORDER — PROMETHAZINE HCL 25 MG/ML IJ SOLN
12.5000 mg | Freq: Once | INTRAMUSCULAR | Status: AC
  Administered 2018-05-21: 12.5 mg via INTRAVENOUS
  Filled 2018-05-21: qty 1

## 2018-05-21 MED ORDER — BUTALBITAL-APAP-CAFFEINE 50-325-40 MG PO TABS: 1.0000 | ORAL_TABLET | Freq: Four times a day (QID) | ORAL | 0 refills | Status: DC | PRN

## 2018-05-21 MED ORDER — DIPHENHYDRAMINE HCL 50 MG/ML IJ SOLN
25.0000 mg | Freq: Once | INTRAMUSCULAR | Status: AC
  Administered 2018-05-21: 25 mg via INTRAVENOUS
  Filled 2018-05-21: qty 1

## 2018-05-21 MED ORDER — FENTANYL CITRATE (PF) 100 MCG/2ML IJ SOLN
50.0000 ug | Freq: Once | INTRAMUSCULAR | Status: AC
  Administered 2018-05-21: 50 ug via INTRAVENOUS
  Filled 2018-05-21: qty 2

## 2018-05-21 NOTE — Progress Notes (Signed)
Preliminary notes--Bilateral lower extremities venous duplex exam completed. Negative for DVT where veins visualized.  Limited study due to patient body habitus.   Result discussed with Dr. Darl Householder.   Victoria Moore (RDMS RVT) 05/21/18 9:55 AM

## 2018-05-21 NOTE — ED Provider Notes (Signed)
  Physical Exam  BP (!) 146/86 (BP Location: Right Arm)   Pulse 74   Temp 98.4 F (36.9 C) (Oral)   Resp 17   Ht 6' (1.829 m)   Wt (!) 138.3 kg (305 lb)   LMP 02/19/2016   SpO2 96%   BMI 41.37 kg/m   Physical Exam  ED Course/Procedures     Procedures  MDM  Care assumed at 7 am.  Patient has been having chest pain, dizziness, headaches for the last several days.  Patient also has bilateral leg swelling.  Labs were unremarkable & impending bilateral DVT studies.   10:37 AM Bilateral DVT studies neg. Headache improved with migraine cocktail. Neuro exam unremarkable. Nursing note overnight noted possible Vtach. I reviewed tracing and appears more just tremors vs PVDs. Delta trop ordered and is negative and patient has no chest pain. Likely migraines, chest pain NOS. Will have her follow up with neurology, cardiology.       Drenda Freeze, MD 05/21/18 [phone]

## 2018-05-21 NOTE — ED Notes (Signed)
Monitor tech noted 6 beat run of v-tach, Dr. Roxanne Mins made aware

## 2018-05-21 NOTE — ED Provider Notes (Signed)
[facility] DEPT Provider Note   CSN: [id number] Arrival date & time: 05/20/18  2241     History   Chief Complaint Chief Complaint  Patient presents with  . Dizziness  . Chest Pain  . Dysuria    HPI Victoria Moore is a 42 y.o. female.  The history is provided by the patient.  She has history of hypertension, asthma, migraines, sarcoidosis and comes in complaining of chest pains, swelling in her legs, migraine headache over the last week.  Chest pain is sharp and over the midsternal area, worse when she lays flat.  Not affected by taking a deep breath.  She did have dyspnea initially, but that has resolved.  No nausea, vomiting, diaphoresis.  Headache is bitemporal but worse on the left and described as a throbbing pain and is typical of her migraines.  She has had some blurred vision.  Headache is worse with exposure to light and noise.  Legs have been swelling and painful for the last week.  Those symptoms have been stable over time.  She tried taking butalbital for her migraine without relief.  She did go to [facility] emergency department 1 week ago, but it was very busy and she left without being seen.  Past Medical History:  Diagnosis Date  . Allergy   . Anemia   . Anxiety   . Arthritis   . Asthma   . Blood transfusion without reported diagnosis   . Chronic kidney disease   . Common migraine with intractable migraine 10/07/2016  . Depression   . GERD (gastroesophageal reflux disease)   . Hypertension   . Migraine   . Sarcoidosis   . Seizures ([place])    last one year ago    Patient Active Problem List   Diagnosis Date Noted  . Essential hypertension 11/15/2016  . 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 Morbid (severe) obesity due to excess calories ([place]) 10/23/2016  . Cough variant asthma vs UACS  10/23/2016  . Common migraine with intractable migraine 10/07/2016  . Dyspnea 04/15/2016  . Hilar adenopathy 04/15/2016  .  Convulsions/seizures ([place]) 03/11/2016  . Fibroids 03/11/2016  . Anemia 11/12/2014  . Symptomatic anemia 11/12/2014  . Menorrhagia 11/12/2014  . Iron deficiency 11/12/2014    Past Surgical History:  Procedure Laterality Date  . ABDOMINAL HYSTERECTOMY    . LAPAROSCOPIC OVARIAN CYSTECTOMY Left 03/11/2016   Procedure: LAPAROSCOPIC OVARIAN CYSTECTOMY;  Surgeon: Eldred Manges, MD;  Location: [facility];  Service: Gynecology;  Laterality: Left;  . LAPAROSCOPIC VAGINAL HYSTERECTOMY WITH SALPINGECTOMY Bilateral 03/11/2016   Procedure: LAPAROSCOPIC ASSISTED VAGINAL HYSTERECTOMY WITH SALPINGECTOMY;  Surgeon: Eldred Manges, MD;  Location: [facility];  Service: Gynecology;  Laterality: Bilateral;  . TUBAL LIGATION       OB History    Gravida  1   Para      Term      Preterm      AB      Living        SAB      TAB      Ectopic      Multiple      Live Births               Home Medications    Prior to Admission medications   Medication Sig Start Date End Date Taking? Authorizing Provider  albuterol (PROVENTIL HFA;VENTOLIN HFA) 108 (90 Base) MCG/ACT inhaler Inhale 1-2 puffs into the lungs every 4 (four) hours as  needed for wheezing or shortness of breath (cough). 01/06/18   Gildardo Pounds, NP  ALPRAZolam Duanne Moron) 1 MG tablet Take 0.5-1 mg by mouth daily as needed for anxiety. 11/26/17   [provider]  amLODipine (NORVASC) 5 MG tablet Take 1 tablet (5 mg total) by mouth daily. 11/24/17   Argentina Donovan, PA-C  busPIRone (BUSPAR) 7.5 MG tablet Take 1 tablet (7.5 mg total) by mouth 3 (three) times daily. 11/24/17   Argentina Donovan, PA-C  butalbital-acetaminophen-caffeine (FIORICET, ESGIC) [phone] MG tablet Take 1-2 tablets by mouth every 6 (six) hours as needed for headache. Patient not taking: Reported on 05/15/2018 01/06/18 01/06/19  Gildardo Pounds, NP  dexlansoprazole (DEXILANT) 60 MG capsule Take 1 capsule (60 mg total) by mouth daily. Patient not taking:  Reported on 05/15/2018 02/08/18   Gildardo Pounds, NP  diclofenac (VOLTAREN) 75 MG EC tablet Take 1 tablet (75 mg total) by mouth 2 (two) times daily. Patient not taking: Reported on 05/15/2018 01/06/18   Gildardo Pounds, NP  HYDROcodone-acetaminophen (NORCO/VICODIN) 5-325 MG tablet Take 1 tablet by mouth every 4 (four) hours as needed. Patient not taking: Reported on 05/15/2018 02/05/18   Isla Pence, MD  hydrOXYzine (VISTARIL) 50 MG capsule Take 1-2 capsules (50-100 mg total) by mouth at bedtime as needed (insomnia). Patient not taking: Reported on 05/15/2018 02/23/18   Jaynee Eagles, PA-C  ibuprofen (ADVIL,MOTRIN) 600 MG tablet Take 1 tablet (600 mg total) by mouth every 6 (six) hours as needed. Patient not taking: Reported on 05/15/2018 01/09/18   Frederica Kuster, PA-C  metoCLOPramide (REGLAN) 10 MG tablet Take 1 tablet (10 mg total) by mouth every 6 (six) hours as needed for nausea (nausea/headache). Patient not taking: Reported on 05/15/2018 09/23/17   Street, Veazie, PA-C  mometasone-formoterol (DULERA) 100-5 MCG/ACT AERO Inhale 2 puffs into the lungs 2 (two) times daily. 11/24/17   Argentina Donovan, PA-C  pantoprazole (PROTONIX) 40 MG tablet Take 1 tablet (40 mg total) by mouth daily. Patient not taking: Reported on 05/15/2018 02/10/18   Gildardo Pounds, NP  ranitidine (ZANTAC) 150 MG tablet Take 150 mg by mouth 3 (three) times daily.    [provider]  sertraline (ZOLOFT) 100 MG tablet Take 1 tablet (100 mg total) by mouth daily. Patient not taking: Reported on 05/15/2018 01/19/18   Gildardo Pounds, NP  SUMAtriptan (IMITREX) 50 MG tablet Take 1-2 tablets (50-100 mg total) by mouth daily as needed for migraine. Patient not taking: Reported on 05/15/2018 02/23/18   Jaynee Eagles, PA-C  topiramate (TOPAMAX) 25 MG tablet Take 2 tablets (50 mg total) by mouth 2 (two) times daily. Patient not taking: Reported on 05/15/2018 02/21/17   Kathrynn Ducking, MD  traZODone (DESYREL) 100 MG tablet Take  1 tablet (100 mg total) by mouth at bedtime. Patient not taking: Reported on 05/15/2018 02/28/17   Kathrynn Ducking, MD  Vitamin D, Ergocalciferol, (DRISDOL) 50000 units CAPS capsule Take 1 capsule (50,000 Units total) by mouth every 7 (seven) days. Patient not taking: Reported on 02/08/2018 11/24/17   Argentina Donovan, PA-C    Family History Family History  Adopted: Yes  Problem Relation Age of Onset  . Other Son        Growing pains  . Asthma Mother   . Heart Problems Mother   . Migraines Mother   . Diabetes Father   . Peptic Ulcer Father   . Heart Problems Father     Social History Social  History   Tobacco Use  . Smoking status: Former Smoker    Packs/day: 0.25    Years: 17.00    Pack years: 4.25    Types: Cigarettes    Last attempt to quit: 03/02/2013    Years since quitting: 5.2  . Smokeless tobacco: Never Used  Substance Use Topics  . Alcohol use: No    Alcohol/week: 0.0 oz    Comment: only on special occasions  . Drug use: No    Types: Marijuana    Comment: former - 6 yrs ago     Allergies   Aspirin   Review of Systems Review of Systems  All other systems reviewed and are negative.    Physical Exam Updated Vital Signs BP (!) 142/100 (BP Location: Right Arm)   Pulse 73   Temp 98.4 F (36.9 C) (Oral)   Resp 18   Ht 6' (1.829 m)   Wt (!) 138.3 kg (305 lb)   LMP 02/19/2016   SpO2 97%   BMI 41.37 kg/m   Physical Exam  Nursing note and vitals reviewed.  42 year old female, resting comfortably and in no acute distress. Vital signs are significant for mild elevation of blood pressure. Oxygen saturation is 97%, which is normal. Head is normocephalic and atraumatic. PERRLA, EOMI. Fundi show no hemorrhage, exudate, papilledema.  Oropharynx is clear. Neck is nontender and supple without adenopathy or JVD. Back is nontender and there is no CVA tenderness. Lungs are clear without rales, wheezes, or rhonchi. Chest is nontender. Heart has regular rate and  rhythm without murmur. Abdomen is soft, flat, nontender without masses or hepatosplenomegaly and peristalsis is normoactive. Extremities have 1+ edema, full range of motion is present.  Mild bilateral calf tenderness, and positive Homans sign bilaterally.  Calf circumference is symmetric. Skin is warm and dry without rash. Neurologic: Mental status is normal, cranial nerves are intact, there are no motor or sensory deficits.  ED Treatments / Results  Labs (all labs ordered are listed, but only abnormal results are displayed) Labs Reviewed  BASIC METABOLIC PANEL - Abnormal; Notable for the following components:      Result Value   Glucose, Bld 116 (*)    Calcium 8.7 (*)    All other components within normal limits  CBC - Abnormal; Notable for the following components:   Hemoglobin 11.1 (*)    HCT 34.4 (*)    All other components within normal limits  URINALYSIS, ROUTINE W REFLEX MICROSCOPIC - Abnormal; Notable for the following components:   APPearance HAZY (*)    Nitrite POSITIVE (*)    Leukocytes, UA LARGE (*)    Bacteria, UA RARE (*)    All other components within normal limits  I-STAT TROPONIN, ED  POC URINE PREG, ED    EKG EKG Interpretation  Date/Time:  Saturday May 20 2018 22:51:30 EDT Ventricular Rate:  67 PR Interval:    QRS Duration: 101 QT Interval:  383 QTC Calculation: 405 R Axis:   64 Text Interpretation:  Sinus rhythm Low voltage, precordial leads When compared with ECG of 05/15/2018, No significant change was found Confirmed by Delora Fuel ([id number]) on 05/21/2018 12:31:24 AM   Radiology Dg Chest 2 View  Result Date: 05/20/2018 CLINICAL DATA:  Central chest pain, shortness of breath EXAM: CHEST - 2 VIEW COMPARISON:  05/15/2018 FINDINGS: The heart size and mediastinal contours are within normal limits. Both lungs are clear. The visualized skeletal structures are unremarkable. IMPRESSION: No active cardiopulmonary disease. Electronically Signed  By: Kathreen Devoid    On: 05/20/2018 23:35    Procedures Procedures  Medications Ordered in ED Medications  metoCLOPramide (REGLAN) injection 10 mg (has no administration in time range)  diphenhydrAMINE (BENADRYL) injection 25 mg (has no administration in time range)  dexamethasone (DECADRON) tablet 10 mg (has no administration in time range)  nitrofurantoin (macrocrystal-monohydrate) (MACROBID) capsule 100 mg (has no administration in time range)     Initial Impression / Assessment and Plan / ED Course  I have reviewed the triage vital signs and the nursing notes.  Pertinent labs & imaging results that were available during my care of the patient were reviewed by me and considered in my medical decision making (see chart for details).  Multiple complaints including chest pain, calf pain and swelling, and headache.  It is unclear if they are related.  However, given nature of pain, does need to be screened with d-dimer to rule out pulmonary embolism.  Low index of suspicion for ACS with normal ECG and negative troponin.  Chest x-ray is also unremarkable.  Because of presence of edema, will not give saline as part of migraine cocktail-she is given metoclopramide, diphenhydramine, dexamethasone to try to resolve her headache.  Will check hepatic function studies to look for other possible causes for peripheral edema.  Incidental finding of urinary tract infection as evidenced by positive nitrite on urinalysis, and it is unclear how this is in with her clinical picture, but she is started on nitrofurantoin.  Old records are reviewed confirming ED visit on May 20 when she left without being seen.  Several prior ED visits for migraines and nonspecific chest pain.  Heart score is 1, which puts her at very low risk for major adverse cardiac events in the next 30 days.  Headache is much improved following above-noted treatment, and she was able to get to sleep.  Hepatic function studies are unremarkable.  Albumin is  normal.  Venous Dopplers are pending.  Case is signed out to Dr. Darl Householder to evaluate venous Doppler reports.  Final Clinical Impressions(s) / ED Diagnoses   Final diagnoses:  Atypical chest pain  Urinary tract infection without hematuria, site unspecified  Peripheral edema  Bilateral calf pain  Normochromic normocytic anemia  Migraine without status migrainosus, not intractable, unspecified migraine type    ED Discharge Orders    None       Delora Fuel, MD 93/71/69 [phone]

## 2018-05-21 NOTE — Discharge Instructions (Addendum)
Take motrin, tylenol for headaches.   Take fioricet for severe headaches   Take macrobid twice daily for a week for UTI.   See your doctor.   Consider follow up with neurologist for your headaches and cardiology for your chest pain   Return to ER if you have worse headaches, weakness, numbness, chest pain, trouble breathing, abdominal pain, fever.

## 2018-05-23 LAB — URINE CULTURE: Culture: >=100000 — AB

## 2018-05-24 ENCOUNTER — Telehealth: Payer: Self-pay | Admitting: Emergency Medicine

## 2018-05-24 ENCOUNTER — Ambulatory Visit: Payer: BLUE CROSS/BLUE SHIELD | Admitting: Pulmonary Disease

## 2018-05-24 NOTE — Telephone Encounter (Signed)
Post ED Visit - Positive Culture Follow-up  Culture report reviewed by antimicrobial stewardship pharmacist:  []  Elenor Quinones, Pharm.D. []  Heide Guile, Pharm.D., BCPS AQ-ID []  Parks Neptune, Pharm.D., BCPS []  Alycia Rossetti, Pharm.D., BCPS []  [place], Pharm.D., BCPS, AAHIVP []  Legrand Como, Pharm.D., BCPS, AAHIVP []  Salome Arnt, PharmD, BCPS [x]  Wynell Balloon, PharmD []  Vincenza Hews, PharmD, BCPS  Positive urine culture Treated with nitrourantoin, organism sensitive to the same and no further patient follow-up is required at this time.  Hazle Nordmann 05/24/2018, 1:06 PM

## 2018-05-26 ENCOUNTER — Ambulatory Visit (INDEPENDENT_AMBULATORY_CARE_PROVIDER_SITE_OTHER): Payer: BLUE CROSS/BLUE SHIELD | Admitting: Adult Health

## 2018-05-26 ENCOUNTER — Encounter: Payer: Self-pay | Admitting: Family Medicine

## 2018-05-26 ENCOUNTER — Ambulatory Visit: Payer: BLUE CROSS/BLUE SHIELD | Admitting: Family Medicine

## 2018-05-26 ENCOUNTER — Encounter: Payer: Self-pay | Admitting: Adult Health

## 2018-05-26 VITALS — BP 142/80 | Ht 72.0 in | Wt 304.1 lb

## 2018-05-26 VITALS — BP 118/82 | HR 59 | Ht 72.0 in | Wt 304.0 lb

## 2018-05-26 DIAGNOSIS — N390 Urinary tract infection, site not specified: Secondary | ICD-10-CM | POA: Insufficient documentation

## 2018-05-26 DIAGNOSIS — Z0001 Encounter for general adult medical examination with abnormal findings: Secondary | ICD-10-CM | POA: Diagnosis not present

## 2018-05-26 DIAGNOSIS — I10 Essential (primary) hypertension: Secondary | ICD-10-CM

## 2018-05-26 DIAGNOSIS — Z862 Personal history of diseases of the blood and blood-forming organs and certain disorders involving the immune mechanism: Secondary | ICD-10-CM | POA: Insufficient documentation

## 2018-05-26 DIAGNOSIS — F321 Major depressive disorder, single episode, moderate: Secondary | ICD-10-CM | POA: Diagnosis not present

## 2018-05-26 DIAGNOSIS — R0683 Snoring: Secondary | ICD-10-CM | POA: Diagnosis not present

## 2018-05-26 DIAGNOSIS — R400 Somnolence: Secondary | ICD-10-CM

## 2018-05-26 DIAGNOSIS — R0789 Other chest pain: Secondary | ICD-10-CM | POA: Diagnosis not present

## 2018-05-26 DIAGNOSIS — R079 Chest pain, unspecified: Secondary | ICD-10-CM | POA: Insufficient documentation

## 2018-05-26 DIAGNOSIS — E559 Vitamin D deficiency, unspecified: Secondary | ICD-10-CM

## 2018-05-26 DIAGNOSIS — K219 Gastro-esophageal reflux disease without esophagitis: Secondary | ICD-10-CM | POA: Insufficient documentation

## 2018-05-26 DIAGNOSIS — D649 Anemia, unspecified: Secondary | ICD-10-CM

## 2018-05-26 HISTORY — DX: Personal history of diseases of the blood and blood-forming organs and certain disorders involving the immune mechanism: Z86.2

## 2018-05-26 HISTORY — DX: Other chest pain: R07.89

## 2018-05-26 HISTORY — DX: Major depressive disorder, single episode, moderate: F32.1

## 2018-05-26 HISTORY — DX: Somnolence: R40.0

## 2018-05-26 MED ORDER — AMLODIPINE BESYLATE 5 MG PO TABS: 5.0000 mg | ORAL_TABLET | Freq: Every day | ORAL | 1 refills | Status: DC

## 2018-05-26 MED ORDER — PREDNISONE 10 MG PO TABS: ORAL_TABLET | ORAL | 0 refills | Status: DC

## 2018-05-26 MED ORDER — CHLORTHALIDONE 25 MG PO TABS: 25.0000 mg | ORAL_TABLET | Freq: Every day | ORAL | 0 refills | Status: DC

## 2018-05-26 MED ORDER — FLUOXETINE HCL 10 MG PO TABS: ORAL_TABLET | ORAL | 0 refills | Status: DC

## 2018-05-26 MED ORDER — ALPRAZOLAM 1 MG PO TABS: 0.5000 mg | ORAL_TABLET | Freq: Every day | ORAL | 0 refills | Status: DC | PRN

## 2018-05-26 NOTE — Progress Notes (Signed)
@Patient  ID: Victoria Moore, female    DOB: Mar 09, 1976, 42 y.o.   MRN: [id number]  Chief Complaint  Patient presents with  . Acute Visit    Dyspnea     Referring provider: Gildardo Pounds, NP  HPI: 42 yo female former smoker followed for possible cough variant asthma and suspected Sarcoid (hilar /subcarinal adenopathy)  PMH : HTN   TEST  See CT 11/12/14 R hilar/ subcarinal : These are not significant by size criteria (size not reported)  - f/u cxr 02/03/2016 no vz adenopathy and nl on 10/22/2016  - ACE  10/22/2016  = 22  - PFTs  10/25/16  FVC  3.28 (85%) s obstruction and dlco 70 corrects to 96%  05/26/2018 Acute OV : Dyspnea  Patient presents for an acute office visit.  Patient complains over the last several weeks she has been having more chest wall pain and tenderness, reflux, pleuritic pain.  Patient has some mild cough.  Complains of some increased joint pain especially in her feet.  Patient was seen in the emergency room, on May 20, 2018.  Chest x-ray showed no acute process.  Clear lungs..  CXR 5/25 with clear lungs, nad .  EKG was unrevealing with normal sinus rhythm.  Troponin was negative.  D Dimer was negative Patient denies any rash, joint swelling, orthopnea, edema. Patient was seen in the office greater than 2 years ago.  At that time felt not to have any active pulmonary sarcoid.  She was referred to rheumatology for possible joint related sarcoid.  Patient says she was told that she had osteoarthritis by rheumatology.   Has snoring , restless sleep . And daytime sleepiness . Wants to be checked for sleep apnea.  Epworth score was 21  Allergies  Allergen Reactions  . Aspirin Anaphylaxis and Hives         Immunization History  Administered Date(s) Administered  . Pneumococcal Polysaccharide-23 11/13/2014  . Tdap 05/08/2014    Past Medical History:  Diagnosis Date  . Allergy   . Anemia   . Anxiety   . Arthritis   . Asthma   . Blood transfusion without  reported diagnosis   . Chronic kidney disease   . Common migraine with intractable migraine 10/07/2016  . Depression   . GERD (gastroesophageal reflux disease)   . Hypertension   . Migraine   . Sarcoidosis   . Seizures ([place])    last one year ago    Tobacco History: Social History   Tobacco Use  Smoking Status Former Smoker  . Packs/day: 0.25  . Years: 17.00  . Pack years: 4.25  . Types: Cigarettes  . Last attempt to quit: 03/02/2013  . Years since quitting: 5.2  Smokeless Tobacco Never Used   Counseling given: Not Answered   Outpatient Encounter Medications as of 05/26/2018  Medication Sig  . albuterol (PROVENTIL HFA;VENTOLIN HFA) 108 (90 Base) MCG/ACT inhaler Inhale 1-2 puffs into the lungs every 4 (four) hours as needed for wheezing or shortness of breath (cough).  . ALPRAZolam (XANAX) 1 MG tablet Take 0.5-1 tablets (0.5-1 mg total) by mouth daily as needed for anxiety.  Marland Kitchen amLODipine (NORVASC) 5 MG tablet Take 1 tablet (5 mg total) by mouth daily.  . chlorthalidone (HYGROTON) 25 MG tablet Take 1 tablet (25 mg total) by mouth daily.  Marland Kitchen FLUoxetine (PROZAC) 10 MG tablet Take one each morning for 7 days and then take 2 each morning.  . mometasone-formoterol (DULERA) 100-5 MCG/ACT AERO Inhale 2  puffs into the lungs 2 (two) times daily.  . nitrofurantoin, macrocrystal-monohydrate, (MACROBID) 100 MG capsule Take 1 capsule (100 mg total) by mouth 2 (two) times daily. X 7 days  . ranitidine (ZANTAC) 150 MG tablet Take 150 mg by mouth 3 (three) times daily.  . predniSONE (DELTASONE) 10 MG tablet 2 tabs daily for 1 week then 1 tab daily for 1 week and stop   No facility-administered encounter medications on file as of 05/26/2018.      Review of Systems  Constitutional:   No  weight loss, night sweats,  Fevers, chills,  +fatigue, or  lassitude.  HEENT:   No headaches,  Difficulty swallowing,  Tooth/dental problems, or  Sore throat,                No sneezing, itching, ear ache,  nasal congestion, post nasal drip,   CV:  No chest pain,  Orthopnea, PND, swelling in lower extremities, anasarca, dizziness, palpitations, syncope.   GI  No heartburn, indigestion, abdominal pain, nausea, vomiting, diarrhea, change in bowel habits, loss of appetite, bloody stools.   Resp:   No chest wall deformity  Skin: no rash or lesions.  GU: no dysuria, change in color of urine, no urgency or frequency.  No flank pain, no hematuria   MS:  No joint pain or swelling.  No decreased range of motion.  No back pain.    Physical Exam  BP 118/82 (BP Location: Left Arm, Cuff Size: Large)   Pulse (!) 59   Ht 6' (1.829 m)   Wt (!) 304 lb (137.9 kg)   LMP 02/19/2016   SpO2 99%   BMI 41.23 kg/m   GEN: A/Ox3; pleasant , NAD, obese   HEENT:  [place]/AT,  EACs-clear, TMs-wnl, NOSE-clear, THROAT-clear, no lesions, no postnasal drip or exudate noted.  Class II-III MP airway  NECK:  Supple w/ fair ROM; no JVD; normal carotid impulses w/o bruits; no thyromegaly or nodules palpated; no lymphadenopathy.    RESP  Clear  P & A; w/o, wheezes/ rales/ or rhonchi. no accessory muscle use, no dullness to percussion.  Anterior chest wall tender to touch.  No bruising or rash noted  CARD:  RRR, no m/r/g, no peripheral edema, pulses intact, no cyanosis or clubbing.  GI:   Soft & nt; nml bowel sounds; no organomegaly or masses detected.   Musco: Warm bil, no deformities or joint swelling noted.   Neuro: alert, no focal deficits noted.    Skin: Warm, no lesions or rashes    Lab Results:  BNP No results found for: BNP  ProBNP  Imaging: Dg Chest 2 View  Result Date: 05/20/2018 CLINICAL DATA:  Central chest pain, shortness of breath EXAM: CHEST - 2 VIEW COMPARISON:  05/15/2018 FINDINGS: The heart size and mediastinal contours are within normal limits. Both lungs are clear. The visualized skeletal structures are unremarkable. IMPRESSION: No active cardiopulmonary disease. Electronically Signed    By: Kathreen Devoid   On: 05/20/2018 23:35   Dg Chest 2 View  Result Date: 05/15/2018 CLINICAL DATA:  Multiple complaints, pain in the right lower leg chest pain EXAM: CHEST - 2 VIEW COMPARISON:  02/05/2018 FINDINGS: The heart size and mediastinal contours are within normal limits. Both lungs are clear. The visualized skeletal structures are unremarkable. IMPRESSION: No active cardiopulmonary disease. Electronically Signed   By: Donavan Foil M.D.   On: 05/15/2018 21:27     Assessment & Plan:   Daytime sleepiness Daytime sleepiness , snoring  and restless sleep , high epworth score  Set up for Home sleep study .    Chest wall pain ? Etiology  Doubt sarcoid as no active changes on Chest xray  Will give short steroid course  Tx w/ warm compresses, GERD tx.  FH hx of CAD , , HTN hx , refer to card for onging chest wall pain   Plan  Patient Instructions  Refer to cardiology .  Prednisone 20mg  daily 5 days then 10mg  daily for 5 days and then stop .  Warm heat to chest wall.  Begin Omeprazole 20mg  daily before meal in am .  Begin Zantac 150mg  At bedtime  .  GERD diet .  Set up for home sleep study .  Follow up with Dr. Melvyn Novas  In 6 weeks and .As needed   Please contact office for sooner follow up if symptoms do not improve or worsen or seek emergency care      GERD (gastroesophageal reflux disease) GERD  tx diet and PPI /Zantac   Plan  Patient Instructions  Refer to cardiology .  Prednisone 20mg  daily 5 days then 10mg  daily for 5 days and then stop .  Warm heat to chest wall.  Begin Omeprazole 20mg  daily before meal in am .  Begin Zantac 150mg  At bedtime  .  GERD diet .  Set up for home sleep study .  Follow up with Dr. Melvyn Novas  In 6 weeks and .As needed   Please contact office for sooner follow up if symptoms do not improve or worsen or seek emergency care          Rexene Edison, NP 05/26/2018

## 2018-05-26 NOTE — Assessment & Plan Note (Signed)
GERD  tx diet and PPI /Zantac   Plan  Patient Instructions  Refer to cardiology .  Prednisone 20mg  daily 5 days then 10mg  daily for 5 days and then stop .  Warm heat to chest wall.  Begin Omeprazole 20mg  daily before meal in am .  Begin Zantac 150mg  At bedtime  .  GERD diet .  Set up for home sleep study .  Follow up with Dr. Melvyn Novas  In 6 weeks and .As needed   Please contact office for sooner follow up if symptoms do not improve or worsen or seek emergency care

## 2018-05-26 NOTE — Patient Instructions (Addendum)
Refer to cardiology .  Prednisone 20mg  daily 5 days then 10mg  daily for 5 days and then stop .  Warm heat to chest wall.  Begin Omeprazole 20mg  daily before meal in am .  Begin Zantac 150mg  At bedtime  .  GERD diet .  Set up for home sleep study .  Follow up with Dr. Melvyn Novas  In 6 weeks and .As needed   Please contact office for sooner follow up if symptoms do not improve or worsen or seek emergency care

## 2018-05-26 NOTE — Addendum Note (Signed)
Addended by: Abelino Derrick A on: 05/26/2018 10:09 AM   Modules accepted: Orders

## 2018-05-26 NOTE — Addendum Note (Signed)
Addended by: Diona Foley on: 05/26/2018 10:37 AM   Modules accepted: Orders

## 2018-05-26 NOTE — Assessment & Plan Note (Signed)
?   Etiology  Doubt sarcoid as no active changes on Chest xray  Will give short steroid course  Tx w/ warm compresses, GERD tx.  FH hx of CAD , , HTN hx , refer to card for onging chest wall pain   Plan  Patient Instructions  Refer to cardiology .  Prednisone 20mg  daily 5 days then 10mg  daily for 5 days and then stop .  Warm heat to chest wall.  Begin Omeprazole 20mg  daily before meal in am .  Begin Zantac 150mg  At bedtime  .  GERD diet .  Set up for home sleep study .  Follow up with Dr. Melvyn Novas  In 6 weeks and .As needed   Please contact office for sooner follow up if symptoms do not improve or worsen or seek emergency care

## 2018-05-26 NOTE — Assessment & Plan Note (Signed)
Daytime sleepiness , snoring and restless sleep , high epworth score  Set up for Home sleep study .

## 2018-05-26 NOTE — Progress Notes (Addendum)
Subjective:  Patient ID: Victoria Moore, female    DOB: 1976-10-17  Age: 42 y.o. MRN: [id number]  CC: Establish Care   HPI Victoria Moore presents for establishment of care and follow-up for multiple medical issues.  She has a history of hypertension that has been uncontrolled on 5 mg of Norvasc daily.  She is measuring systolic pressures at home in the 150s.  She has been having headaches described as bitemporal and squeezing.  She has a history of migraine headaches and these are not her usual migraines.  She denies that the headaches have been progressive or associated with unilateral weakness paresthesias or difficulty swallowing.  She has been under a lot of great deal of stress.  She lives with her significant other.  She works as a Psychologist, counselling on an visiting basis.  She has a 61 year old son who is studying biology in college.  She hopes she will choose medical school.  Recent visit to the ER on the 26th where she was seen for chest pain.  Cardiac work up was negative at that time.  She was diagnosed with a UTI in the emergency room and has been taking Macrobid for that.  Her symptoms are improved.  She she has a history of vitamin D deficiency and is currently not on supplementation.  She has been taking Xanax intermittently for anxiety.  When I asked her about depression she did tear.  Her life is been stressful for her today.  She has a history of anemia.  She is no longer administrating.  She has seen no blood in her urine.  She does not smoke drink alcohol or use illicit drugs.  She had a Pap smear last year and these were performed by her GYN provider.  Outpatient Medications Prior to Visit  Medication Sig Dispense Refill  . albuterol (PROVENTIL HFA;VENTOLIN HFA) 108 (90 Base) MCG/ACT inhaler Inhale 1-2 puffs into the lungs every 4 (four) hours as needed for wheezing or shortness of breath (cough). 1 Inhaler 1  . mometasone-formoterol (DULERA) 100-5 MCG/ACT AERO Inhale 2 puffs into  the lungs 2 (two) times daily. 1 Inhaler 5  . nitrofurantoin, macrocrystal-monohydrate, (MACROBID) 100 MG capsule Take 1 capsule (100 mg total) by mouth 2 (two) times daily. X 7 days 14 capsule 0  . ranitidine (ZANTAC) 150 MG tablet Take 150 mg by mouth 3 (three) times daily.    Marland Kitchen ALPRAZolam (XANAX) 1 MG tablet Take 0.5-1 mg by mouth daily as needed for anxiety.  0  . amLODipine (NORVASC) 5 MG tablet Take 1 tablet (5 mg total) by mouth daily. 90 tablet 3  . butalbital-acetaminophen-caffeine (FIORICET, ESGIC) 50-325-40 MG tablet Take 1-2 tablets by mouth every 6 (six) hours as needed for headache. 20 tablet 0  . busPIRone (BUSPAR) 7.5 MG tablet Take 1 tablet (7.5 mg total) by mouth 3 (three) times daily. (Patient not taking: Reported on 05/21/2018) 90 tablet 3  . dexlansoprazole (DEXILANT) 60 MG capsule Take 1 capsule (60 mg total) by mouth daily. (Patient not taking: Reported on 05/15/2018) 30 capsule 3  . diclofenac (VOLTAREN) 75 MG EC tablet Take 1 tablet (75 mg total) by mouth 2 (two) times daily. (Patient not taking: Reported on 05/15/2018) 30 tablet 1  . HYDROcodone-acetaminophen (NORCO/VICODIN) 5-325 MG tablet Take 1 tablet by mouth every 4 (four) hours as needed. (Patient not taking: Reported on 05/15/2018) 10 tablet 0  . hydrOXYzine (VISTARIL) 50 MG capsule Take 1-2 capsules (50-100 mg total) by mouth at bedtime as  needed (insomnia). (Patient not taking: Reported on 05/15/2018) 30 capsule 1  . ibuprofen (ADVIL,MOTRIN) 600 MG tablet Take 1 tablet (600 mg total) by mouth every 6 (six) hours as needed. (Patient not taking: Reported on 05/15/2018) 30 tablet 0  . metoCLOPramide (REGLAN) 10 MG tablet Take 1 tablet (10 mg total) by mouth every 6 (six) hours as needed for nausea (nausea/headache). (Patient not taking: Reported on 05/15/2018) 12 tablet 0  . pantoprazole (PROTONIX) 40 MG tablet Take 1 tablet (40 mg total) by mouth daily. (Patient not taking: Reported on 05/15/2018) 30 tablet 1  . sertraline  (ZOLOFT) 100 MG tablet Take 1 tablet (100 mg total) by mouth daily. (Patient not taking: Reported on 05/15/2018) 90 tablet 0  . SUMAtriptan (IMITREX) 50 MG tablet Take 1-2 tablets (50-100 mg total) by mouth daily as needed for migraine. (Patient not taking: Reported on 05/15/2018) 10 tablet 0  . topiramate (TOPAMAX) 25 MG tablet Take 2 tablets (50 mg total) by mouth 2 (two) times daily. (Patient not taking: Reported on 05/15/2018) 120 tablet 3  . traZODone (DESYREL) 100 MG tablet Take 1 tablet (100 mg total) by mouth at bedtime. (Patient not taking: Reported on 05/15/2018) 30 tablet 1  . Vitamin D, Ergocalciferol, (DRISDOL) 50000 units CAPS capsule Take 1 capsule (50,000 Units total) by mouth every 7 (seven) days. (Patient not taking: Reported on 02/08/2018) 16 capsule 0   No facility-administered medications prior to visit.     ROS Review of Systems  Constitutional: Negative.  Negative for chills, fatigue, fever and unexpected weight change.  HENT: Negative for congestion, postnasal drip, rhinorrhea, sinus pressure, sinus pain and sneezing.   Eyes: Negative for photophobia and visual disturbance.  Respiratory: Negative for cough and wheezing.   Cardiovascular: Positive for leg swelling. Negative for chest pain and palpitations.  Gastrointestinal: Negative.   Endocrine: Negative for polyphagia and polyuria.  Genitourinary: Negative for difficulty urinating, frequency and vaginal pain.  Musculoskeletal: Negative for gait problem and joint swelling.  Skin: Negative for pallor and rash.  Allergic/Immunologic: Negative for immunocompromised state.  Neurological: Positive for headaches. Negative for dizziness, facial asymmetry, speech difficulty, weakness, light-headedness and numbness.  Hematological: Does not bruise/bleed easily.  Psychiatric/Behavioral: Positive for dysphoric mood. The patient is nervous/anxious.     Objective:  BP (!) 142/80   Ht 6' (1.829 m)   Wt (!) 304 lb 2 oz (138 kg)    LMP 02/19/2016   BMI 41.25 kg/m   BP Readings from Last 3 Encounters:  05/26/18 (!) 142/80  05/21/18 (!) 146/86  05/15/18 (!) 152/93    Wt Readings from Last 3 Encounters:  05/26/18 (!) 304 lb 2 oz (138 kg)  05/20/18 (!) 305 lb (138.3 kg)  02/08/18 (!) 308 lb (139.7 kg)    Physical Exam  Constitutional: She is oriented to person, place, and time. She appears well-developed and well-nourished. No distress.  HENT:  Head: Normocephalic and atraumatic.  Right Ear: External ear normal.  Left Ear: External ear normal.  Nose: Nose normal.  Mouth/Throat: Oropharynx is clear and moist. No oropharyngeal exudate.  Eyes: Pupils are equal, round, and reactive to light. Conjunctivae and EOM are normal. Right eye exhibits no discharge. Left eye exhibits no discharge. No scleral icterus.  Neck: Normal range of motion. Neck supple. No JVD present. No tracheal deviation present. No thyromegaly present.  Cardiovascular: Normal rate, regular rhythm and normal heart sounds.  Pulses:      Dorsalis pedis pulses are 2+ on the right side,  and 2+ on the left side.       Posterior tibial pulses are 2+ on the right side, and 2+ on the left side.  Pulmonary/Chest: Effort normal and breath sounds normal.  Abdominal: Bowel sounds are normal.  Musculoskeletal: She exhibits no edema.  Lymphadenopathy:    She has no cervical adenopathy.  Neurological: She is alert and oriented to person, place, and time.  Skin: Skin is warm and dry. Capillary refill takes less than 2 seconds. She is not diaphoretic.   Depression screen [facility] 2/9 05/26/2018 02/08/2018 01/06/2018  Decreased Interest 1 0 0  Down, Depressed, Hopeless 1 3 1   PHQ - 2 Score 2 3 1   Altered sleeping 3 3 3   Tired, decreased energy 3 3 3   Change in appetite 3 3 3   Feeling bad or failure about yourself  0 0 0  Trouble concentrating 3 3 3   Moving slowly or fidgety/restless 0 3 0  Suicidal thoughts 0 0 0  PHQ-9 Score 14 18 13     Lab Results    Component Value Date   WBC 5.4 05/20/2018   HGB 11.1 (L) 05/20/2018   HCT 34.4 (L) 05/20/2018   PLT 218 05/20/2018   GLUCOSE 116 (H) 05/20/2018   ALT 11 (L) 05/21/2018   AST 18 05/21/2018   NA 140 05/20/2018   K 3.6 05/20/2018   CL 107 05/20/2018   CREATININE 0.92 05/20/2018   BUN 13 05/20/2018   CO2 24 05/20/2018   TSH 1.750 11/24/2017   INR 1.08 09/22/2015    No results found.  Assessment & Plan:   Victoria Moore was seen today for establish care.  Diagnoses and all orders for this visit:  Essential hypertension -     CBC -     Comprehensive metabolic panel -     chlorthalidone (HYGROTON) 25 MG tablet; Take 1 tablet (25 mg total) by mouth daily.  Anemia, unspecified type -     Iron, TIBC and Ferritin Panel -     B12 and Folate Panel  Morbid (severe) obesity due to excess calories (HCC)  Vitamin D deficiency -     VITAMIN D 25 Hydroxy (Vit-D Deficiency, Fractures)  Depression, major, single episode, moderate (HCC) -     ALPRAZolam (XANAX) 1 MG tablet; Take 0.5-1 tablets (0.5-1 mg total) by mouth daily as needed for anxiety. -     FLUoxetine (PROZAC) 10 MG tablet; Take one each morning for 7 days and then take 2 each morning.  Encounter for health maintenance examination with abnormal findings -     HIV antibody -     Lipid panel  Urinary tract infection without hematuria, site unspecified  Hypertension, unspecified type -     amLODipine (NORVASC) 5 MG tablet; Take 1 tablet (5 mg total) by mouth daily.   I have discontinued Victoria Moore's topiramate, traZODone, metoCLOPramide, Vitamin D (Ergocalciferol), busPIRone, diclofenac, ibuprofen, sertraline, HYDROcodone-acetaminophen, dexlansoprazole, pantoprazole, SUMAtriptan, hydrOXYzine, and butalbital-acetaminophen-caffeine. I have also changed her ALPRAZolam. Additionally, I am having her start on FLUoxetine and chlorthalidone. Lastly, I am having her maintain her mometasone-formoterol, albuterol, ranitidine,  nitrofurantoin (macrocrystal-monohydrate), and amLODipine.  Meds ordered this encounter  Medications  . ALPRAZolam (XANAX) 1 MG tablet    Sig: Take 0.5-1 tablets (0.5-1 mg total) by mouth daily as needed for anxiety.    Dispense:  30 tablet    Refill:  0  . FLUoxetine (PROZAC) 10 MG tablet    Sig: Take one each morning for 7 days and  then take 2 each morning.    Dispense:  60 tablet    Refill:  0  . amLODipine (NORVASC) 5 MG tablet    Sig: Take 1 tablet (5 mg total) by mouth daily.    Dispense:  90 tablet    Refill:  1  . chlorthalidone (HYGROTON) 25 MG tablet    Sig: Take 1 tablet (25 mg total) by mouth daily.    Dispense:  90 tablet    Refill:  0   We discussed weaning her away from the Xanax and starting Prozac.  I do believe that she is depressed and is also suffering from anxiety.  See also her PHQ 9.  Will add chlorthalidone to her Norvasc for blood pressure control.  Believe that her blood pressure and depression have been responsible for most of her headaches.  Believe that once these are treated her headaches will resolve.  Also believe that chlorthalidone should help the swelling in her feet.  She has no edema.  Follow-up: Return in about 1 month (around 06/25/2018).  Libby Maw, MD

## 2018-05-27 NOTE — Progress Notes (Signed)
Chart and office note reviewed in detail along with available xrays/ labs > agree with a/p as outlined  

## 2018-06-01 ENCOUNTER — Encounter

## 2018-06-07 ENCOUNTER — Ambulatory Visit: Payer: Self-pay | Admitting: *Deleted

## 2018-06-07 NOTE — Telephone Encounter (Signed)
Dr. Ethelene Hal - please advise.   Patient is coming in on Friday to see you for back pain.

## 2018-06-07 NOTE — Telephone Encounter (Signed)
Pt reports itching "All over" since starting Norvasc 05/26/18. Denies rash, no hives, no redness. Denies any SOB or dysphagia, no swelling of throat,mouth. Last took dose this am. Pt requesting medication change.  Please advise: [phone]  Reason for Disposition . Caller has NON-URGENT medication question about med that PCP prescribed and triager unable to answer question  Answer Assessment - Initial Assessment Questions 1. SYMPTOMS: "Do you have any symptoms?"    Itching "All over" since starting on Norvasc 05/26/18 2. SEVERITY: If symptoms are present, ask "Are they mild, moderate or severe?"     Moderate itching, no rash, no hives, no other symptoms  Protocols used: MEDICATION QUESTION CALL-A-AH

## 2018-06-09 ENCOUNTER — Ambulatory Visit: Payer: BLUE CROSS/BLUE SHIELD | Admitting: Family Medicine

## 2018-06-12 ENCOUNTER — Encounter: Payer: Self-pay | Admitting: Family Medicine

## 2018-06-12 NOTE — Telephone Encounter (Signed)
Patient no-showed for appointment. Message sent to patient asking her to schedule an appointment. As of now, patient is suppose to come in on 7/1.

## 2018-06-12 NOTE — Telephone Encounter (Signed)
Will see her then 

## 2018-06-14 ENCOUNTER — Ambulatory Visit: Payer: BLUE CROSS/BLUE SHIELD | Admitting: Family Medicine

## 2018-06-19 ENCOUNTER — Ambulatory Visit: Payer: BLUE CROSS/BLUE SHIELD | Admitting: Family Medicine

## 2018-06-20 DIAGNOSIS — G4733 Obstructive sleep apnea (adult) (pediatric): Secondary | ICD-10-CM | POA: Diagnosis not present

## 2018-06-21 ENCOUNTER — Other Ambulatory Visit: Payer: Self-pay | Admitting: *Deleted

## 2018-06-21 DIAGNOSIS — G4733 Obstructive sleep apnea (adult) (pediatric): Secondary | ICD-10-CM | POA: Diagnosis not present

## 2018-06-21 DIAGNOSIS — R0683 Snoring: Secondary | ICD-10-CM

## 2018-06-21 DIAGNOSIS — R400 Somnolence: Secondary | ICD-10-CM

## 2018-06-22 ENCOUNTER — Encounter: Payer: Self-pay | Admitting: Family Medicine

## 2018-06-22 ENCOUNTER — Ambulatory Visit: Payer: BLUE CROSS/BLUE SHIELD | Admitting: Family Medicine

## 2018-06-22 VITALS — BP 118/80 | HR 77 | Ht 72.0 in | Wt 307.5 lb

## 2018-06-22 DIAGNOSIS — F321 Major depressive disorder, single episode, moderate: Secondary | ICD-10-CM

## 2018-06-22 DIAGNOSIS — M545 Low back pain, unspecified: Secondary | ICD-10-CM

## 2018-06-22 DIAGNOSIS — D509 Iron deficiency anemia, unspecified: Secondary | ICD-10-CM

## 2018-06-22 DIAGNOSIS — E876 Hypokalemia: Secondary | ICD-10-CM

## 2018-06-22 DIAGNOSIS — I10 Essential (primary) hypertension: Secondary | ICD-10-CM | POA: Diagnosis not present

## 2018-06-22 DIAGNOSIS — E559 Vitamin D deficiency, unspecified: Secondary | ICD-10-CM | POA: Diagnosis not present

## 2018-06-22 DIAGNOSIS — T887XXA Unspecified adverse effect of drug or medicament, initial encounter: Secondary | ICD-10-CM | POA: Insufficient documentation

## 2018-06-22 HISTORY — DX: Low back pain: M54.5

## 2018-06-22 HISTORY — DX: Low back pain, unspecified: M54.50

## 2018-06-22 MED ORDER — KETOROLAC TROMETHAMINE 60 MG/2ML IM SOLN
60.0000 mg | Freq: Once | INTRAMUSCULAR | Status: AC
  Administered 2018-06-22: 60 mg via INTRAMUSCULAR

## 2018-06-22 MED ORDER — FLUOXETINE HCL 20 MG PO TABS: 20.0000 mg | ORAL_TABLET | Freq: Every day | ORAL | 3 refills | Status: DC

## 2018-06-22 MED ORDER — CYCLOBENZAPRINE HCL 10 MG PO TABS: 10.0000 mg | ORAL_TABLET | Freq: Three times a day (TID) | ORAL | 0 refills | Status: DC | PRN

## 2018-06-22 MED ORDER — CARVEDILOL PHOSPHATE ER 20 MG PO CP24: 20.0000 mg | ORAL_CAPSULE | Freq: Every day | ORAL | 1 refills | Status: DC

## 2018-06-22 NOTE — Progress Notes (Addendum)
Subjective:  Patient ID: Victoria Moore, female    DOB: 1976/06/29  Age: 42 y.o. MRN: [id number]  CC: Back Pain   HPI Victoria Moore presents for follow-up of multiple medical issues.  She has been taking the Norvasc with improvement in her blood pressure but it makes her itch.  She denies that this side effect in the past.  She has no rash with it.  The flap for the last few weeks or so she has been experiencing lower back pain.  It is more so on the right.  It moves down the side of her right leg into her thigh.  She has no numbness or weakness.  There are no saddle paresthesias.  No changes in her bowel or bladder function.  No injuries that she can think of.  She works in a group home.  Prozac is helping her anxiety and she has not taken the Xanax.  She is still just taking 10 mg Prozac.  Outpatient Medications Prior to Visit  Medication Sig Dispense Refill  . albuterol (PROVENTIL HFA;VENTOLIN HFA) 108 (90 Base) MCG/ACT inhaler Inhale 1-2 puffs into the lungs every 4 (four) hours as needed for wheezing or shortness of breath (cough). 1 Inhaler 1  . ALPRAZolam (XANAX) 1 MG tablet Take 0.5-1 tablets (0.5-1 mg total) by mouth daily as needed for anxiety. 30 tablet 0  . chlorthalidone (HYGROTON) 25 MG tablet Take 1 tablet (25 mg total) by mouth daily. 90 tablet 0  . mometasone-formoterol (DULERA) 100-5 MCG/ACT AERO Inhale 2 puffs into the lungs 2 (two) times daily. 1 Inhaler 5  . nitrofurantoin, macrocrystal-monohydrate, (MACROBID) 100 MG capsule Take 1 capsule (100 mg total) by mouth 2 (two) times daily. X 7 days 14 capsule 0  . predniSONE (DELTASONE) 10 MG tablet 2 tabs daily for 1 week then 1 tab daily for 1 week and stop 21 tablet 0  . ranitidine (ZANTAC) 150 MG tablet Take 150 mg by mouth 3 (three) times daily.    Marland Kitchen amLODipine (NORVASC) 5 MG tablet Take 1 tablet (5 mg total) by mouth daily. 90 tablet 1  . FLUoxetine (PROZAC) 10 MG tablet Take one each morning for 7 days and then take 2 each  morning. 60 tablet 0   No facility-administered medications prior to visit.     ROS Review of Systems  Constitutional: Negative for chills, fatigue, fever and unexpected weight change.  HENT: Negative.   Eyes: Negative.   Respiratory: Negative.   Cardiovascular: Negative.   Gastrointestinal: Negative.   Endocrine: Negative for polyphagia and polyuria.  Genitourinary: Negative.   Musculoskeletal: Positive for back pain. Negative for myalgias.  Skin: Negative.   Allergic/Immunologic: Negative for immunocompromised state.  Neurological: Negative for weakness, numbness and headaches.  Hematological: Does not bruise/bleed easily.  Psychiatric/Behavioral: Negative.     Objective:  BP 118/80   Pulse 77   Ht 6' (1.829 m)   Wt (!) 307 lb 8 oz (139.5 kg)   LMP 02/19/2016   SpO2 99%   BMI 41.70 kg/m   BP Readings from Last 3 Encounters:  06/22/18 118/80  05/26/18 118/82  05/26/18 (!) 142/80    Wt Readings from Last 3 Encounters:  06/22/18 (!) 307 lb 8 oz (139.5 kg)  05/26/18 (!) 304 lb (137.9 kg)  05/26/18 (!) 304 lb 2 oz (138 kg)    Physical Exam  Constitutional: She is oriented to person, place, and time. She appears well-developed and well-nourished. No distress.  HENT:  Head: Normocephalic and  atraumatic.  Right Ear: External ear normal.  Left Ear: External ear normal.  Mouth/Throat: Oropharynx is clear and moist.  Eyes: Pupils are equal, round, and reactive to light. Conjunctivae and EOM are normal. Right eye exhibits no discharge. Left eye exhibits no discharge. No scleral icterus.  Neck: No JVD present. No tracheal deviation present. No thyromegaly present.  Cardiovascular: Normal rate, regular rhythm and normal heart sounds.  Pulmonary/Chest: Effort normal and breath sounds normal.  Musculoskeletal:       Lumbar back: She exhibits decreased range of motion. She exhibits no tenderness, no bony tenderness and no spasm.       Back:  Lymphadenopathy:    She has  no cervical adenopathy.  Neurological: She is alert and oriented to person, place, and time. She has normal strength. She displays normal reflexes. She exhibits normal muscle tone.  Reflex Scores:      Patellar reflexes are 1+ on the right side and 1+ on the left side.      Achilles reflexes are 1+ on the right side and 1+ on the left side. Skin: Skin is warm. No rash noted. She is not diaphoretic. No erythema. No pallor.  Psychiatric: She has a normal mood and affect. Her behavior is normal.    Lab Results  Component Value Date   WBC 3.8 (L) 06/27/2018   HGB 11.6 (L) 06/27/2018   HCT 34.9 (L) 06/27/2018   PLT 199.0 06/27/2018   GLUCOSE 109 (H) 06/27/2018   CHOL 178 06/27/2018   TRIG 63.0 06/27/2018   HDL 49.90 06/27/2018   LDLCALC 115 (H) 06/27/2018   ALT 13 06/27/2018   AST 14 06/27/2018   NA 139 06/27/2018   K 3.2 (L) 06/27/2018   CL 100 06/27/2018   CREATININE 0.97 06/27/2018   BUN 10 06/27/2018   CO2 32 06/27/2018   TSH 1.750 11/24/2017   INR 1.08 09/22/2015    No results found.  Assessment & Plan:   Victoria Moore was seen today for back pain.  Diagnoses and all orders for this visit:  Essential hypertension -     carvedilol (COREG CR) 20 MG 24 hr capsule; Take 1 capsule (20 mg total) by mouth daily.  Medication side effect  Acute right-sided low back pain, with sciatica presence unspecified -     cyclobenzaprine (FLEXERIL) 10 MG tablet; Take 1 tablet (10 mg total) by mouth 3 (three) times daily as needed for muscle spasms. -     ketorolac (TORADOL) injection 60 mg  Depression, major, single episode, moderate (HCC) -     FLUoxetine (PROZAC) 20 MG tablet; Take 1 tablet (20 mg total) by mouth daily.  Hypokalemia -     Potassium Chloride ER 20 MEQ TBCR; Take one daily.  Iron deficiency anemia, unspecified iron deficiency anemia type -     ferrous sulfate 325 (65 FE) MG tablet; Take 1 tablet (325 mg total) by mouth daily with breakfast.  Vitamin D deficiency -      Vitamin D, Ergocalciferol, (DRISDOL) 50000 units CAPS capsule; Take 1 capsule (50,000 Units total) by mouth every 7 (seven) days.   I have discontinued [name]'s FLUoxetine and amLODipine. I am also having her start on FLUoxetine, cyclobenzaprine, carvedilol, Potassium Chloride ER, ferrous sulfate, and Vitamin D (Ergocalciferol). Additionally, I am having her maintain her mometasone-formoterol, albuterol, ranitidine, nitrofurantoin (macrocrystal-monohydrate), ALPRAZolam, chlorthalidone, and predniSONE. We administered ketorolac.  Meds ordered this encounter  Medications  . FLUoxetine (PROZAC) 20 MG tablet    Sig: Take  1 tablet (20 mg total) by mouth daily.    Dispense:  30 tablet    Refill:  3  . cyclobenzaprine (FLEXERIL) 10 MG tablet    Sig: Take 1 tablet (10 mg total) by mouth 3 (three) times daily as needed for muscle spasms.    Dispense:  30 tablet    Refill:  0  . carvedilol (COREG CR) 20 MG 24 hr capsule    Sig: Take 1 capsule (20 mg total) by mouth daily.    Dispense:  30 capsule    Refill:  1  . ketorolac (TORADOL) injection 60 mg  . Potassium Chloride ER 20 MEQ TBCR    Sig: Take one daily.    Dispense:  30 tablet    Refill:  2  . ferrous sulfate 325 (65 FE) MG tablet    Sig: Take 1 tablet (325 mg total) by mouth daily with breakfast.    Dispense:  90 tablet    Refill:  1  . Vitamin D, Ergocalciferol, (DRISDOL) 50000 units CAPS capsule    Sig: Take 1 capsule (50,000 Units total) by mouth every 7 (seven) days.    Dispense:  5 capsule    Refill:  5     Follow-up: Return in about 1 month (around 07/22/2018).  Libby Maw, MD

## 2018-06-22 NOTE — Patient Instructions (Signed)

## 2018-06-26 ENCOUNTER — Ambulatory Visit: Payer: BLUE CROSS/BLUE SHIELD | Admitting: Family Medicine

## 2018-06-27 ENCOUNTER — Other Ambulatory Visit (INDEPENDENT_AMBULATORY_CARE_PROVIDER_SITE_OTHER): Payer: BLUE CROSS/BLUE SHIELD

## 2018-06-27 ENCOUNTER — Other Ambulatory Visit: Payer: BLUE CROSS/BLUE SHIELD

## 2018-06-27 DIAGNOSIS — I10 Essential (primary) hypertension: Secondary | ICD-10-CM

## 2018-06-27 DIAGNOSIS — E559 Vitamin D deficiency, unspecified: Secondary | ICD-10-CM

## 2018-06-27 DIAGNOSIS — D649 Anemia, unspecified: Secondary | ICD-10-CM

## 2018-06-27 DIAGNOSIS — Z0001 Encounter for general adult medical examination with abnormal findings: Secondary | ICD-10-CM

## 2018-06-27 LAB — IBC PANEL
Iron: 54 ug/dL (ref 42–145)
Saturation Ratios: 13.9 % — ABNORMAL LOW (ref 20.0–50.0)
Transferrin: 277 mg/dL (ref 212.0–360.0)

## 2018-06-27 LAB — LIPID PANEL
Cholesterol: 178 mg/dL (ref 0–200)
HDL: 49.9 mg/dL (ref >39.00)
LDL Cholesterol: 115 mg/dL — ABNORMAL HIGH (ref 0–99)
NonHDL: 127.81
Total CHOL/HDL Ratio: 4
Triglycerides: 63 mg/dL (ref 0.0–149.0)
VLDL: 12.6 mg/dL (ref 0.0–40.0)

## 2018-06-27 LAB — CBC
HCT: 34.9 % — ABNORMAL LOW (ref 36.0–46.0)
Hemoglobin: 11.6 g/dL — ABNORMAL LOW (ref 12.0–15.0)
MCHC: 33.2 g/dL (ref 30.0–36.0)
MCV: 83.7 fl (ref 78.0–100.0)
Platelets: 199 10*3/uL (ref 150.0–400.0)
RBC: 4.17 Mil/uL (ref 3.87–5.11)
RDW: 14.4 % (ref 11.5–15.5)
WBC: 3.8 10*3/uL — ABNORMAL LOW (ref 4.0–10.5)

## 2018-06-27 LAB — COMPREHENSIVE METABOLIC PANEL
ALT: 13 U/L (ref 0–35)
AST: 14 U/L (ref 0–37)
Albumin: 3.8 g/dL (ref 3.5–5.2)
Alkaline Phosphatase: 52 U/L (ref 39–117)
BUN: 10 mg/dL (ref 6–23)
CO2: 32 mEq/L (ref 19–32)
Calcium: 9 mg/dL (ref 8.4–10.5)
Chloride: 100 mEq/L (ref 96–112)
Creatinine, Ser: 0.97 mg/dL (ref 0.40–1.20)
GFR: 80.15 mL/min (ref >60.00)
Glucose, Bld: 109 mg/dL — ABNORMAL HIGH (ref 70–99)
Potassium: 3.2 mEq/L — ABNORMAL LOW (ref 3.5–5.1)
Sodium: 139 mEq/L (ref 135–145)
Total Bilirubin: 0.3 mg/dL (ref 0.2–1.2)
Total Protein: 6.7 g/dL (ref 6.0–8.3)

## 2018-06-27 LAB — VITAMIN D 25 HYDROXY (VIT D DEFICIENCY, FRACTURES): VITD: 22.09 ng/mL — ABNORMAL LOW (ref 30.00–100.00)

## 2018-06-27 LAB — IRON: Iron: 54 ug/dL (ref 42–145)

## 2018-06-27 LAB — FERRITIN: Ferritin: 8.8 ng/mL — ABNORMAL LOW (ref 10.0–291.0)

## 2018-06-27 LAB — B12 AND FOLATE PANEL
Folate: >24.1 ng/mL (ref >5.9)
Vitamin B-12: 642 pg/mL (ref 211–911)

## 2018-06-28 ENCOUNTER — Encounter: Payer: Self-pay | Admitting: Family Medicine

## 2018-06-28 DIAGNOSIS — E876 Hypokalemia: Secondary | ICD-10-CM | POA: Insufficient documentation

## 2018-06-28 HISTORY — DX: Hypokalemia: E87.6

## 2018-06-28 LAB — HIV ANTIBODY (ROUTINE TESTING W REFLEX): HIV 1&2 Ab, 4th Generation: NONREACTIVE

## 2018-06-28 MED ORDER — POTASSIUM CHLORIDE ER 20 MEQ PO TBCR: EXTENDED_RELEASE_TABLET | ORAL | 2 refills | Status: DC

## 2018-06-28 MED ORDER — FERROUS SULFATE 325 (65 FE) MG PO TABS: 325.0000 mg | ORAL_TABLET | Freq: Every day | ORAL | 1 refills | Status: DC

## 2018-06-28 MED ORDER — VITAMIN D (ERGOCALCIFEROL) 1.25 MG (50000 UNIT) PO CAPS: 50000.0000 [IU] | ORAL_CAPSULE | ORAL | 5 refills | Status: DC

## 2018-06-28 NOTE — Addendum Note (Signed)
Addended by: Jon Billings on: 06/28/2018 08:20 AM   Modules accepted: Orders

## 2018-06-28 NOTE — Addendum Note (Signed)
Addended by: Jon Billings on: 06/28/2018 08:24 AM   Modules accepted: Orders

## 2018-06-29 ENCOUNTER — Other Ambulatory Visit: Payer: Self-pay | Admitting: Family Medicine

## 2018-06-29 DIAGNOSIS — I10 Essential (primary) hypertension: Secondary | ICD-10-CM

## 2018-06-30 MED ORDER — CARVEDILOL PHOSPHATE ER 20 MG PO CP24
ORAL_CAPSULE | ORAL | 0 refills | Status: DC
Start: 2018-06-30 — End: 2019-08-06

## 2018-06-30 NOTE — Addendum Note (Signed)
Addended by: Kateri Mc E on: 06/30/2018 08:22 AM   Modules accepted: Orders

## 2018-07-06 ENCOUNTER — Encounter: Payer: Self-pay | Admitting: Family Medicine

## 2018-07-06 ENCOUNTER — Ambulatory Visit: Payer: BLUE CROSS/BLUE SHIELD | Admitting: Physician Assistant

## 2018-07-07 ENCOUNTER — Ambulatory Visit: Payer: BLUE CROSS/BLUE SHIELD | Admitting: Internal Medicine

## 2018-07-08 ENCOUNTER — Ambulatory Visit (HOSPITAL_COMMUNITY)
Admission: EM | Admit: 2018-07-08 | Discharge: 2018-07-08 | Disposition: A | Discharge status: Home / Self Care | Payer: BLUE CROSS/BLUE SHIELD | Attending: Internal Medicine | Admitting: Internal Medicine

## 2018-07-08 ENCOUNTER — Encounter (HOSPITAL_COMMUNITY): Payer: Self-pay | Admitting: *Deleted

## 2018-07-08 ENCOUNTER — Ambulatory Visit (INDEPENDENT_AMBULATORY_CARE_PROVIDER_SITE_OTHER): Payer: BLUE CROSS/BLUE SHIELD

## 2018-07-08 DIAGNOSIS — S93402A Sprain of unspecified ligament of left ankle, initial encounter: Secondary | ICD-10-CM

## 2018-07-08 MED ORDER — IBUPROFEN 800 MG PO TABS: 800.0000 mg | ORAL_TABLET | Freq: Three times a day (TID) | ORAL | 0 refills | Status: DC

## 2018-07-08 NOTE — Discharge Instructions (Signed)
Use anti-inflammatories for pain/swelling. You may take up to 800 mg Ibuprofen every 8 hours with food. You may supplement Ibuprofen with Tylenol [phone] mg every 8 hours.   Slowly transition to weightbearing, use crutches as needed  Ice and elevate at home  I expect pain to gradually improve over the next couple weeks

## 2018-07-08 NOTE — ED Triage Notes (Signed)
Reports slipping down 12 steps approx 30 min ago, injuring left ankle.  C/O pain, swelling, inability to bear weight.  CMS intact.

## 2018-07-08 NOTE — ED Provider Notes (Signed)
[facility]    CSN: [id number] Arrival date & time: 07/08/18  1705     History   Chief Complaint Chief Complaint  Patient presents with  . Ankle Injury    HPI Victoria Moore is a 42 y.o. female history of hypertension presenting today for evaluation of left ankle pain.  Patient was walking down steps and accidentally rolled her ankle and slipped on a few steps.  Since she has had difficulty with weightbearing and significant pain to her left ankle.  She has not taken any medicines for her pain.  HPI  Past Medical History:  Diagnosis Date  . Allergy   . Anemia   . Anxiety   . Arthritis   . Asthma   . Blood transfusion without reported diagnosis   . Chronic kidney disease   . Common migraine with intractable migraine 10/07/2016  . Depression   . GERD (gastroesophageal reflux disease)   . Hypertension   . Migraine   . Sarcoidosis   . Seizures ([place])    last one year ago    Patient Active Problem List   Diagnosis Date Noted  . Hypokalemia 06/28/2018  . Medication side effect 06/22/2018  . Acute right-sided low back pain 06/22/2018  . Personal history of sarcoidosis 05/26/2018  . Vitamin D deficiency 05/26/2018  . Encounter for health maintenance examination with abnormal findings 05/26/2018  . Depression, major, single episode, moderate ([place]) 05/26/2018  . Urinary tract infection without hematuria 05/26/2018  . Daytime sleepiness 05/26/2018  . Chest wall pain 05/26/2018  . GERD (gastroesophageal reflux disease) 05/26/2018  . Essential hypertension 11/15/2016  . 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 Morbid (severe) obesity due to excess calories ([place]) 10/23/2016  . Cough variant asthma vs UACS  10/23/2016  . Common migraine with intractable migraine 10/07/2016  . Dyspnea 04/15/2016  . Hilar adenopathy 04/15/2016  . Convulsions/seizures ([place]) 03/11/2016  . Fibroids 03/11/2016  . Anemia 11/12/2014  . Symptomatic anemia 11/12/2014  . Menorrhagia  11/12/2014  . Iron deficiency 11/12/2014    Past Surgical History:  Procedure Laterality Date  . ABDOMINAL HYSTERECTOMY    . LAPAROSCOPIC OVARIAN CYSTECTOMY Left 03/11/2016   Procedure: LAPAROSCOPIC OVARIAN CYSTECTOMY;  Surgeon: Eldred Manges, MD;  Location: [facility];  Service: Gynecology;  Laterality: Left;  . LAPAROSCOPIC VAGINAL HYSTERECTOMY WITH SALPINGECTOMY Bilateral 03/11/2016   Procedure: LAPAROSCOPIC ASSISTED VAGINAL HYSTERECTOMY WITH SALPINGECTOMY;  Surgeon: Eldred Manges, MD;  Location: [facility];  Service: Gynecology;  Laterality: Bilateral;  . TUBAL LIGATION      OB History    Gravida  1   Para      Term      Preterm      AB      Living        SAB      TAB      Ectopic      Multiple      Live Births               Home Medications    Prior to Admission medications   Medication Sig Start Date End Date Taking? Authorizing Provider  ALPRAZolam Duanne Moron) 1 MG tablet Take 0.5-1 tablets (0.5-1 mg total) by mouth daily as needed for anxiety. 05/26/18  Yes Libby Maw, MD  carvedilol (COREG CR) 20 MG 24 hr capsule TAKE 1 CAPSULE BY MOUTH EVERY DAY 06/30/18  Yes Libby Maw, MD  chlorthalidone (HYGROTON) 25 MG tablet Take 1 tablet (25 mg total)  by mouth daily. 05/26/18  Yes Libby Maw, MD  ferrous sulfate 325 (65 FE) MG tablet Take 1 tablet (325 mg total) by mouth daily with breakfast. 06/28/18  Yes Libby Maw, MD  FLUoxetine (PROZAC) 20 MG tablet Take 1 tablet (20 mg total) by mouth daily. 06/22/18  Yes Libby Maw, MD  mometasone-formoterol [facility]) 100-5 MCG/ACT AERO Inhale 2 puffs into the lungs 2 (two) times daily. 11/24/17  Yes Argentina Donovan, PA-C  Potassium Chloride ER 20 MEQ TBCR Take one daily. 06/28/18  Yes Libby Maw, MD  ranitidine (ZANTAC) 150 MG tablet Take 150 mg by mouth 3 (three) times daily.   Yes [provider]  Vitamin D, Ergocalciferol, (DRISDOL) 50000 units CAPS  capsule Take 1 capsule (50,000 Units total) by mouth every 7 (seven) days. 06/28/18  Yes Libby Maw, MD  albuterol (PROVENTIL HFA;VENTOLIN HFA) 108 (90 Base) MCG/ACT inhaler Inhale 1-2 puffs into the lungs every 4 (four) hours as needed for wheezing or shortness of breath (cough). 01/06/18   Gildardo Pounds, NP  ibuprofen (ADVIL,MOTRIN) 800 MG tablet Take 1 tablet (800 mg total) by mouth 3 (three) times daily. 07/08/18   Arleny Kruger, Elesa Hacker, PA-C    Family History Family History  Adopted: Yes  Problem Relation Age of Onset  . Other Son        Growing pains  . Asthma Mother   . Heart Problems Mother   . Migraines Mother   . Diabetes Father   . Peptic Ulcer Father   . Heart Problems Father     Social History Social History   Tobacco Use  . Smoking status: Former Smoker    Packs/day: 0.25    Years: 17.00    Pack years: 4.25    Types: Cigarettes    Last attempt to quit: 03/02/2013    Years since quitting: 5.3  . Smokeless tobacco: Never Used  Substance Use Topics  . Alcohol use: No    Alcohol/week: 0.0 oz  . Drug use: No    Types: Marijuana    Comment: former - 6 yrs ago     Allergies   Aspirin   Review of Systems Review of Systems  Constitutional: Negative for fatigue and fever.  Respiratory: Negative for shortness of breath.   Cardiovascular: Negative for chest pain.  Gastrointestinal: Negative for nausea and vomiting.  Musculoskeletal: Positive for arthralgias, gait problem, joint swelling and myalgias. Negative for neck pain and neck stiffness.  Skin: Negative for color change, pallor and wound.  Neurological: Positive for weakness. Negative for dizziness, light-headedness, numbness and headaches.     Physical Exam Triage Vital Signs ED Triage Vitals  Enc Vitals Group     BP 07/08/18 1716 (!) 141/72     Pulse Rate 07/08/18 1716 78     Resp 07/08/18 1716 18     Temp 07/08/18 1716 98.4 F (36.9 C)     Temp Source 07/08/18 1716 Oral     SpO2  07/08/18 1716 99 %     Weight --      Height --      Head Circumference --      Peak Flow --      Pain Score 07/08/18 1717 10     Pain Loc --      Pain Edu? --      Excl. in [place]? --    No data found.  Updated Vital Signs BP (!) 141/72   Pulse 78  Temp 98.4 F (36.9 C) (Oral)   Resp 18   LMP 02/19/2016   SpO2 99%   Visual Acuity Right Eye Distance:   Left Eye Distance:   Bilateral Distance:    Right Eye Near:   Left Eye Near:    Bilateral Near:     Physical Exam  Constitutional: She is oriented to person, place, and time. She appears well-developed and well-nourished.  No acute distress  HENT:  Head: Normocephalic and atraumatic.  Nose: Nose normal.  Eyes: Conjunctivae are normal.  Neck: Neck supple.  Cardiovascular: Normal rate.  Pulmonary/Chest: Effort normal. No respiratory distress.  Abdominal: She exhibits no distension.  Musculoskeletal: Normal range of motion.  Moderate swelling to lateral and medial malleolus of left ankle, significant tenderness over this area as well as the anterior aspect of ankle, nontender to palpation over first through fifth metatarsals.  Dorsalis pedis 2+  Neurological: She is alert and oriented to person, place, and time.  Skin: Skin is warm and dry.  Psychiatric: She has a normal mood and affect.  Nursing note and vitals reviewed.    UC Treatments / Results  Labs (all labs ordered are listed, but only abnormal results are displayed) Labs Reviewed - No data to display  EKG None  Radiology Dg Ankle 2 Views Left  Result Date: 07/08/2018 CLINICAL DATA:  Patient states that she missed some steps today and twisted her left ankle, pain and swelling, non weight bearing. Hx of left ankle fracture 01/2018 EXAM: LEFT ANKLE - 2 VIEW COMPARISON:  01/09/2018 FINDINGS: No acute fracture. Fracture of the distal fibula noted on the prior study has healed. Ankle mortise is normally spaced and aligned. Diffuse soft tissue swelling.  IMPRESSION: 1. No acute fracture or dislocation. 2. Previously seen fracture of the distal fibula has healed. Electronically Signed   By: Lajean Manes M.D.   On: 07/08/2018 17:43    Procedures Procedures (including critical care time)  Medications Ordered in UC Medications - No data to display  Initial Impression / Assessment and Plan / UC Course  I have reviewed the triage vital signs and the nursing notes.  Pertinent labs & imaging results that were available during my care of the patient were reviewed by me and considered in my medical decision making (see chart for details).     No fracture seen on x-ray, likely ankle sprain.  Will provide ankle brace and crutches, slowly change dressing to weightbearing.  Tylenol and ibuprofen for pain.  Has aspirin anaphylaxis allergy, patient stated she has tolerated ibuprofen before and does not have any issues.  ASO ankle brace also provided for further support.Discussed strict return precautions. Patient verbalized understanding and is agreeable with plan.  Final Clinical Impressions(s) / UC Diagnoses   Final diagnoses:  Sprain of left ankle, unspecified ligament, initial encounter     Discharge Instructions     Use anti-inflammatories for pain/swelling. You may take up to 800 mg Ibuprofen every 8 hours with food. You may supplement Ibuprofen with Tylenol [phone] mg every 8 hours.   Slowly transition to weightbearing, use crutches as needed  Ice and elevate at home  I expect pain to gradually improve over the next couple weeks    ED Prescriptions    Medication Sig Dispense Auth. Provider   ibuprofen (ADVIL,MOTRIN) 800 MG tablet Take 1 tablet (800 mg total) by mouth 3 (three) times daily. 21 tablet Tira Lafferty, [place] C, PA-C     Controlled Substance Prescriptions [place] Controlled Substance Registry  consulted? Not Applicable   Janith Lima, [place] 07/08/18 1850

## 2018-07-11 ENCOUNTER — Other Ambulatory Visit: Payer: Self-pay | Admitting: Urgent Care

## 2018-08-02 ENCOUNTER — Telehealth: Payer: Self-pay | Admitting: Internal Medicine

## 2018-08-02 DIAGNOSIS — G4733 Obstructive sleep apnea (adult) (pediatric): Secondary | ICD-10-CM

## 2018-08-02 NOTE — Telephone Encounter (Signed)
Called and spoke to Patient.  HST results are not available at this time. Informed Patient that someone would contact her with results soon as they are available. Understanding stated.  Nothing further at this time.

## 2018-08-03 DIAGNOSIS — G4730 Sleep apnea, unspecified: Secondary | ICD-10-CM

## 2018-08-03 HISTORY — DX: Sleep apnea, unspecified: G47.30

## 2018-08-03 NOTE — Telephone Encounter (Signed)
I don't see HST in epic

## 2018-08-03 NOTE — Telephone Encounter (Signed)
She does have sleep apea but very mild, no immediate need for rx but set up with sleep medicine consultation

## 2018-08-03 NOTE — Telephone Encounter (Signed)
Called and spoke with patient regarding HST results  Advised pt that at this time I do not see any results available  MW please advise

## 2018-08-03 NOTE — Telephone Encounter (Signed)
Results printed and placed in your lookat

## 2018-08-03 NOTE — Telephone Encounter (Signed)
Called and spoke with patient regarding results.  Informed the patient of results and recommendations today. Scheduled sleep consult with OL for 08/09/18 at 4:30pm Pt verbalized understanding and denied any questions or concerns at this time.  Nothing further needed.

## 2018-08-04 ENCOUNTER — Other Ambulatory Visit: Payer: Self-pay | Admitting: Family Medicine

## 2018-08-04 DIAGNOSIS — F321 Major depressive disorder, single episode, moderate: Secondary | ICD-10-CM

## 2018-08-09 ENCOUNTER — Institutional Professional Consult (permissible substitution): Payer: BLUE CROSS/BLUE SHIELD | Admitting: Pulmonary Disease

## 2018-08-15 ENCOUNTER — Ambulatory Visit: Payer: BLUE CROSS/BLUE SHIELD | Admitting: Cardiology

## 2018-08-15 NOTE — Progress Notes (Deleted)
Cardiology Office Note:    Date:  08/15/2018   ID:  Victoria Moore, DOB 10-02-1976, MRN [id number]  PCP:  Victoria Maw, MD  Cardiologist:  No primary care provider on file.   Referring MD: Victoria Moore,*     History of Present Illness:    Victoria Moore is a 42 y.o. female here for the evaluation of chest Moore at the request of Dr. Ethelene Moore.  In review of prior office note she was seen by pulmonary medicine with suspected sarcoid, demonstrating hilar and subcarinal adenopathy.  She was having more chest wall Moore as well as tenderness, GERD-like sensations and pleuritic discomfort.  She was having some mild coughing.  Troponin and d-dimer were negative in May 2019 emergency room visit.  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  Given her chest wall Moore she was given a short steroid course.  She does have a family history of coronary artery disease however.  Past Medical History:  Diagnosis Date  . Allergy   . Anemia   . Anxiety   . Arthritis   . Asthma   . Blood transfusion without reported diagnosis   . Chronic kidney disease   . Common migraine with intractable migraine 10/07/2016  . Depression   . GERD (gastroesophageal reflux disease)   . Hypertension   . Migraine   . Sarcoidosis   . Seizures ([place])    last one year ago    Past Surgical History:  Procedure Laterality Date  . ABDOMINAL HYSTERECTOMY    . LAPAROSCOPIC OVARIAN CYSTECTOMY Left 03/11/2016   Procedure: LAPAROSCOPIC OVARIAN CYSTECTOMY;  Surgeon: Victoria Manges, MD;  Location: [facility];  Service: Gynecology;  Laterality: Left;  . LAPAROSCOPIC VAGINAL HYSTERECTOMY WITH SALPINGECTOMY Bilateral 03/11/2016   Procedure: LAPAROSCOPIC ASSISTED VAGINAL HYSTERECTOMY WITH SALPINGECTOMY;  Surgeon: Victoria Manges, MD;  Location: [facility];  Service: Gynecology;  Laterality: Bilateral;  . TUBAL LIGATION       Current Medications: No outpatient medications have been marked as taking for the 08/15/18 encounter (Appointment) with Victoria Pain, MD.     Allergies:   Aspirin   Social History   Socioeconomic History  . Marital status: Single    Spouse name: Not on file  . Number of children: 1  . Years of education: 25  . Highest education level: Not on file  Occupational History  . Occupation: [employer]  . Financial resource strain: Not on file  . Food insecurity:    Worry: Not on file    Inability: Not on file  . Transportation needs:    Medical: Not on file    Non-medical: Not on file  Tobacco Use  . Smoking status: Former Smoker    Packs/day: 0.25    Years: 17.00    Pack years: 4.25    Types: Cigarettes    Last attempt to quit: 03/02/2013    Years since quitting: 5.4  . Smokeless tobacco: Never Used  Substance and Sexual Activity  . Alcohol use: No    Alcohol/week: 0.0 standard drinks  . Drug use: No    Types: Marijuana    Comment: former - 6 yrs ago  . Sexual activity: Not on file  Lifestyle  . Physical activity:    Days per week: Not on file    Minutes per session: Not on file  . Stress: Not on  file  Relationships  . Social connections:    Talks on phone: Not on file    Gets together: Not on file    Attends religious service: Not on file    Active member of club or organization: Not on file    Attends meetings of clubs or organizations: Not on file    Relationship status: Not on file  Other Topics Concern  . Not on file  Social History Narrative   She lives w/ her fiance'. She has one son.    Highest level of education:  12th grade, did not graduate. Currently back in school.   Right-handed   Caffeine: 1 cup of coffee some mornings     Family History: The patient's ***family history includes Asthma in her mother; Diabetes in her father; Heart Problems in her father and mother; Migraines in her mother; Other in her son; Peptic Ulcer in her  father. She was adopted.  ROS:   Please see the history of present illness.    *** All other systems reviewed and are negative.  EKGs/Labs/Other Studies Reviewed:    The following studies were reviewed today: ***  EKG: 05/20/2018-sinus rhythm with no other significant abnormalities.  Reviewed.  Recent Labs: 11/24/2017: TSH 1.750 06/27/2018: ALT 13; BUN 10; Creatinine, Ser 0.97; Hemoglobin 11.6; Platelets 199.0; Potassium 3.2; Sodium 139  Recent Lipid Panel    Component Value Date/Time   CHOL 178 06/27/2018 0915   TRIG 63.0 06/27/2018 0915   HDL 49.90 06/27/2018 0915   CHOLHDL 4 06/27/2018 0915   VLDL 12.6 06/27/2018 0915   LDLCALC 115 (H) 06/27/2018 0915    Physical Exam:    VS:  LMP 02/19/2016     Wt Readings from Last 3 Encounters:  06/22/18 (!) 307 lb 8 oz (139.5 kg)  05/26/18 (!) 304 lb (137.9 kg)  05/26/18 (!) 304 lb 2 oz (138 kg)     GEN: *** Well nourished, well developed in no acute distress HEENT: Normal NECK: No JVD; No carotid bruits LYMPHATICS: No lymphadenopathy CARDIAC: ***RRR, no murmurs, rubs, gallops RESPIRATORY:  Clear to auscultation without rales, wheezing or rhonchi  ABDOMEN: Soft, non-tender, non-distended MUSCULOSKELETAL:  No edema; No deformity  SKIN: Warm and dry NEUROLOGIC:  Alert and oriented x 3 PSYCHIATRIC:  Normal affect   ASSESSMENT:    No diagnosis found. PLAN:    In order of problems listed above:  Atypical chest wall Moore    Medication Adjustments/Labs and Tests Ordered: Current medicines are reviewed at length with the patient today.  Concerns regarding medicines are outlined above.  No orders of the defined types were placed in this encounter.  No orders of the defined types were placed in this encounter.   There are no Patient Instructions on file for this visit.   Signed, Victoria Furbish, MD  08/15/2018 8:08 AM    [facility]

## 2018-08-16 ENCOUNTER — Encounter: Payer: Self-pay | Admitting: Gastroenterology

## 2018-08-18 ENCOUNTER — Encounter: Payer: Self-pay | Admitting: Family Medicine

## 2018-08-18 ENCOUNTER — Ambulatory Visit (HOSPITAL_BASED_OUTPATIENT_CLINIC_OR_DEPARTMENT_OTHER)
Admission: RE | Admit: 2018-08-18 | Discharge: 2018-08-18 | Disposition: A | Discharge status: Home / Self Care | Payer: BLUE CROSS/BLUE SHIELD | Source: Ambulatory Visit | Attending: Family Medicine | Admitting: Family Medicine

## 2018-08-18 ENCOUNTER — Ambulatory Visit: Payer: Self-pay | Admitting: Family Medicine

## 2018-08-18 ENCOUNTER — Ambulatory Visit: Payer: BLUE CROSS/BLUE SHIELD | Admitting: Family Medicine

## 2018-08-18 ENCOUNTER — Telehealth: Payer: Self-pay

## 2018-08-18 VITALS — BP 124/78 | HR 100 | Ht 72.0 in | Wt 319.0 lb

## 2018-08-18 DIAGNOSIS — M7989 Other specified soft tissue disorders: Secondary | ICD-10-CM | POA: Insufficient documentation

## 2018-08-18 DIAGNOSIS — D649 Anemia, unspecified: Secondary | ICD-10-CM | POA: Diagnosis not present

## 2018-08-18 DIAGNOSIS — F321 Major depressive disorder, single episode, moderate: Secondary | ICD-10-CM

## 2018-08-18 DIAGNOSIS — K219 Gastro-esophageal reflux disease without esophagitis: Secondary | ICD-10-CM

## 2018-08-18 DIAGNOSIS — D509 Iron deficiency anemia, unspecified: Secondary | ICD-10-CM

## 2018-08-18 DIAGNOSIS — E611 Iron deficiency: Secondary | ICD-10-CM | POA: Diagnosis not present

## 2018-08-18 DIAGNOSIS — I10 Essential (primary) hypertension: Secondary | ICD-10-CM

## 2018-08-18 DIAGNOSIS — E559 Vitamin D deficiency, unspecified: Secondary | ICD-10-CM | POA: Diagnosis not present

## 2018-08-18 DIAGNOSIS — E876 Hypokalemia: Secondary | ICD-10-CM

## 2018-08-18 HISTORY — DX: Other specified soft tissue disorders: M79.89

## 2018-08-18 LAB — CBC
HCT: 34.7 % — ABNORMAL LOW (ref 36.0–46.0)
Hemoglobin: 11.4 g/dL — ABNORMAL LOW (ref 12.0–15.0)
MCHC: 32.9 g/dL (ref 30.0–36.0)
MCV: 83.5 fl (ref 78.0–100.0)
Platelets: 190 10*3/uL (ref 150.0–400.0)
RBC: 4.15 Mil/uL (ref 3.87–5.11)
RDW: 13.9 % (ref 11.5–15.5)
WBC: 5 10*3/uL (ref 4.0–10.5)

## 2018-08-18 LAB — VITAMIN D 25 HYDROXY (VIT D DEFICIENCY, FRACTURES): VITD: 20.07 ng/mL — ABNORMAL LOW (ref 30.00–100.00)

## 2018-08-18 MED ORDER — FERROUS SULFATE 325 (65 FE) MG PO TABS: 325.0000 mg | ORAL_TABLET | Freq: Every day | ORAL | 1 refills | Status: DC

## 2018-08-18 MED ORDER — VITAMIN D (ERGOCALCIFEROL) 1.25 MG (50000 UNIT) PO CAPS: 50000.0000 [IU] | ORAL_CAPSULE | ORAL | 5 refills | Status: DC

## 2018-08-18 MED ORDER — FLUOXETINE HCL 20 MG PO TABS: 20.0000 mg | ORAL_TABLET | Freq: Every day | ORAL | 0 refills | Status: DC

## 2018-08-18 MED ORDER — PANTOPRAZOLE SODIUM 40 MG PO TBEC: 40.0000 mg | DELAYED_RELEASE_TABLET | Freq: Every day | ORAL | 3 refills | Status: DC

## 2018-08-18 MED ORDER — TRAMADOL HCL 50 MG PO TABS: 50.0000 mg | ORAL_TABLET | Freq: Two times a day (BID) | ORAL | 0 refills | Status: AC | PRN

## 2018-08-18 NOTE — Telephone Encounter (Signed)
Phone call from pt. to report increased swelling of feet and ankles, and increased blood pressure over past few days.  Stated she has swelling in feet up to ankles with right slightly larger than left.  Pt. Stated she feels her swelling is "severe."  Denied any redness or warmth of LE's.  C/o pain with walking especially in the ankles; rated at 10/10.  Denied any calf pain.  Reported she has frequent, intermittent muscle cramps in her calves.  Reported today's BP readings: 143/93 @ 8:30 AM, and 147/93 @ 11:00 AM.  Stated her BP has been in the above range over past few days.  C/o headache.  Stated has bronchitis.  C/o pain in center of chest with coughing, that subsides after coughing.  Reported "a little SOB with activity."  Also c/o hand swelling.     Reason for Disposition . [1] MODERATE leg swelling (e.g., swelling extends up to knees) AND [2] new onset or worsening    C/o swelling of bilateral feet and ankles with right > left.  C/o swelling of hands.  Described the swelling of her feet and ankles as "severe".   Denied swelling in legs.  Reported elevated BP readings.  Answer Assessment - Initial Assessment Questions 1. ONSET: "When did the swelling start?" (e.g., minutes, hours, days)     Ongoing  2. LOCATION: "What part of the leg is swollen?"  "Are both legs swollen or just one leg?"     Feet and ankles; right slightly bigger than left  3. SEVERITY: "How bad is the swelling?" (e.g., localized; mild, moderate, severe)  - Localized - small area of swelling localized to one leg  - MILD pedal edema - swelling limited to foot and ankle, pitting edema < 1/4 inch (6 mm) deep, rest and elevation eliminate most or all swelling  - MODERATE edema - swelling of lower leg to knee, pitting edema > 1/4 inch (6 mm) deep, rest and elevation only partially reduce swelling  - SEVERE edema - swelling extends above knee, facial or hand swelling present      Stating severe 4. REDNESS: "Does the swelling look red  or infected?"     Denied redness or warmth  5. PAIN: "Is the swelling painful to touch?" If so, ask: "How painful is it?"   (Scale 1-10; mild, moderate or severe)     Feet and ankles hurt with walking 10+; especially in the ankles  6. FEVER: "Do you have a fever?" If so, ask: "What is it, how was it measured, and when did it start?"     Denied 7. CAUSE: "What do you think is causing the leg swelling?"     Has been on fluid medications  8. MEDICAL HISTORY: "Do you have a history of heart failure, kidney disease, liver failure, or cancer?"     Denied chronic medical problems 9. RECURRENT SYMPTOM: "Have you had leg swelling before?" If so, ask: "When was the last time?" "What happened that time?"    Feels worsening in swelling the past few days 10. OTHER SYMPTOMS: "Do you have any other symptoms?" (e.g., chest pain, difficulty breathing)      Headache, a little shortness of breath with activity.  Has bronchitis and stated her chest hurts with coughing; reported it hurts in center of chest but subsides when coughing stops.; BP 143/93 @ 8:30 AM today and 147/93 @ 11:00 AM today 11. PREGNANCY: "Is there any chance you are pregnant?" "When was your last menstrual period?"  No; has had tubal ligation and hysterectomy  Protocols used: LEG SWELLING AND EDEMA-A-AH

## 2018-08-18 NOTE — Progress Notes (Signed)
Subjective:  Patient ID: Victoria Moore, female    DOB: 05-Jul-1976  Age: 42 y.o. MRN: [id number]  CC: Leg Swelling   HPI Julian Askin presents for evaluation of multiple complaints and follow-up of multiple medical issues.  She has been taking the Prozac at 20 mg of over a month and still feels sad.  She has a loss of interest in overall decreased sense of well-being.  She is having headaches muscle aches and joint aches.  Her ankles are bothering her a lot.  There is swelling in her legs that seems to be a little worse on the right side.  Father did have blood clots in his legs.  Patient quit smoking some years ago.  She complains of severe reflux but is no longer relieved by her Zantac.  It is affected her ability to eat that she is experienced a 12 pound weight gain.  She took the vitamin D weekly for a month but did not have it refilled.  She claims compliance with her iron and potassium.   Outpatient Medications Prior to Visit  Medication Sig Dispense Refill  . albuterol (PROVENTIL HFA;VENTOLIN HFA) 108 (90 Base) MCG/ACT inhaler Inhale 1-2 puffs into the lungs every 4 (four) hours as needed for wheezing or shortness of breath (cough). 1 Inhaler 1  . ALPRAZolam (XANAX) 1 MG tablet Take 0.5-1 tablets (0.5-1 mg total) by mouth daily as needed for anxiety. 30 tablet 0  . carvedilol (COREG CR) 20 MG 24 hr capsule TAKE 1 CAPSULE BY MOUTH EVERY DAY 90 capsule 0  . chlorthalidone (HYGROTON) 25 MG tablet Take 1 tablet (25 mg total) by mouth daily. 90 tablet 0  . ibuprofen (ADVIL,MOTRIN) 800 MG tablet Take 1 tablet (800 mg total) by mouth 3 (three) times daily. 21 tablet 0  . mometasone-formoterol (DULERA) 100-5 MCG/ACT AERO Inhale 2 puffs into the lungs 2 (two) times daily. 1 Inhaler 5  . Potassium Chloride ER 20 MEQ TBCR Take one daily. 30 tablet 2  . ferrous sulfate 325 (65 FE) MG tablet Take 1 tablet (325 mg total) by mouth daily with breakfast. 90 tablet 1  . FLUoxetine (PROZAC) 20 MG tablet  TAKE 1 TABLET BY MOUTH EVERY DAY 90 tablet 0  . ranitidine (ZANTAC) 150 MG tablet Take 150 mg by mouth 3 (three) times daily.    . Vitamin D, Ergocalciferol, (DRISDOL) 50000 units CAPS capsule Take 1 capsule (50,000 Units total) by mouth every 7 (seven) days. 5 capsule 5   No facility-administered medications prior to visit.     ROS Review of Systems  Constitutional: Positive for fatigue. Negative for chills, fever and unexpected weight change.  HENT: Negative.   Eyes: Negative for photophobia and visual disturbance.  Respiratory: Negative for cough, chest tightness and shortness of breath.   Cardiovascular: Positive for leg swelling. Negative for chest pain and palpitations.  Gastrointestinal: Negative.   Endocrine: Negative for polyphagia and polyuria.  Genitourinary: Negative.   Musculoskeletal: Positive for arthralgias and myalgias.  Skin: Negative for pallor and rash.  Allergic/Immunologic: Negative for immunocompromised state.  Neurological: Negative for light-headedness.  Hematological: Does not bruise/bleed easily.  Psychiatric/Behavioral: Positive for dysphoric mood.    Objective:  BP 124/78   Pulse 100   Ht 6' (1.829 m)   Wt (!) 319 lb (144.7 kg)   LMP 02/19/2016   SpO2 98%   BMI 43.26 kg/m   BP Readings from Last 3 Encounters:  08/18/18 124/78  07/08/18 (!) 141/72  06/22/18 118/80  Wt Readings from Last 3 Encounters:  08/18/18 (!) 319 lb (144.7 kg)  06/22/18 (!) 307 lb 8 oz (139.5 kg)  05/26/18 (!) 304 lb (137.9 kg)    Physical Exam  Constitutional: She is oriented to person, place, and time. She appears well-developed and well-nourished. No distress.  HENT:  Head: Normocephalic and atraumatic.  Right Ear: External ear normal.  Left Ear: External ear normal.  Eyes: Conjunctivae are normal. Right eye exhibits no discharge. Left eye exhibits no discharge. No scleral icterus.  Neck: No JVD present. No tracheal deviation present. No thyromegaly present.    Cardiovascular: Normal rate, regular rhythm and normal heart sounds.  Pulmonary/Chest: Effort normal and breath sounds normal.  Abdominal: Bowel sounds are normal.  Musculoskeletal: She exhibits no edema.       Legs: Neurological: She is alert and oriented to person, place, and time.  Skin: Skin is warm and dry. She is not diaphoretic.  Psychiatric: Her behavior is normal.    Lab Results  Component Value Date   WBC 3.8 (L) 06/27/2018   HGB 11.6 (L) 06/27/2018   HCT 34.9 (L) 06/27/2018   PLT 199.0 06/27/2018   GLUCOSE 109 (H) 06/27/2018   CHOL 178 06/27/2018   TRIG 63.0 06/27/2018   HDL 49.90 06/27/2018   LDLCALC 115 (H) 06/27/2018   ALT 13 06/27/2018   AST 14 06/27/2018   NA 139 06/27/2018   K 3.2 (L) 06/27/2018   CL 100 06/27/2018   CREATININE 0.97 06/27/2018   BUN 10 06/27/2018   CO2 32 06/27/2018   TSH 1.750 11/24/2017   INR 1.08 09/22/2015    Dg Ankle 2 Views Left  Result Date: 07/08/2018 CLINICAL DATA:  Patient states that she missed some steps today and twisted her left ankle, pain and swelling, non weight bearing. Hx of left ankle fracture 01/2018 EXAM: LEFT ANKLE - 2 VIEW COMPARISON:  01/09/2018 FINDINGS: No acute fracture. Fracture of the distal fibula noted on the prior study has healed. Ankle mortise is normally spaced and aligned. Diffuse soft tissue swelling. IMPRESSION: 1. No acute fracture or dislocation. 2. Previously seen fracture of the distal fibula has healed. Electronically Signed   By: Lajean Manes M.D.   On: 07/08/2018 17:43   Depression screen [facility] 2/9 08/18/2018 06/22/2018 05/26/2018  Decreased Interest 3 1 1   Down, Depressed, Hopeless 1 1 1   PHQ - 2 Score 4 2 2   Altered sleeping 3 3 3   Tired, decreased energy 2 3 3   Change in appetite 0 2 3  Feeling bad or failure about yourself  0 0 0  Trouble concentrating 1 2 3   Moving slowly or fidgety/restless 0 0 0  Suicidal thoughts 0 0 0  PHQ-9 Score 10 12 14     Assessment & Plan:   Maley was  seen today for leg swelling.  Diagnoses and all orders for this visit:  Essential hypertension  Gastroesophageal reflux disease without esophagitis -     pantoprazole (PROTONIX) 40 MG tablet; Take 1 tablet (40 mg total) by mouth daily.  Anemia, unspecified type -     CBC  Iron deficiency -     CBC -     Iron, TIBC and Ferritin Panel  Morbid (severe) obesity due to excess calories (HCC) -     Amb ref to Medical Nutrition Therapy-MNT  Vitamin D deficiency -     VITAMIN D 25 Hydroxy (Vit-D Deficiency, Fractures) -     Vitamin D, Ergocalciferol, (DRISDOL) 50000 units CAPS  capsule; Take 1 capsule (50,000 Units total) by mouth every 7 (seven) days.  Depression, major, single episode, moderate (HCC) -     FLUoxetine (PROZAC) 20 MG tablet; Take 1 tablet (20 mg total) by mouth daily.  Hypokalemia  Right leg swelling -     Cancel: VAS [place] LOWER EXTREMITY VENOUS (DVT); Future -     US Venous Img Lower Unilateral Right; Future -     traMADol (ULTRAM) 50 MG tablet; Take 1 tablet (50 mg total) by mouth every 12 (twelve) hours as needed for up to 3 days.  Iron deficiency anemia, unspecified iron deficiency anemia type -     ferrous sulfate 325 (65 FE) MG tablet; Take 1 tablet (325 mg total) by mouth daily with breakfast.   I have discontinued Gaynel Farmer's ranitidine. I have also changed her FLUoxetine. Additionally, I am having her start on pantoprazole and traMADol. Lastly, I am having her maintain her mometasone-formoterol, albuterol, ALPRAZolam, chlorthalidone, Potassium Chloride ER, carvedilol, ibuprofen, Vitamin D (Ergocalciferol), and ferrous sulfate.  Meds ordered this encounter  Medications  . FLUoxetine (PROZAC) 20 MG tablet    Sig: Take 1 tablet (20 mg total) by mouth daily.    Dispense:  90 tablet    Refill:  0  . Vitamin D, Ergocalciferol, (DRISDOL) 50000 units CAPS capsule    Sig: Take 1 capsule (50,000 Units total) by mouth every 7 (seven) days.    Dispense:  5  capsule    Refill:  5  . ferrous sulfate 325 (65 FE) MG tablet    Sig: Take 1 tablet (325 mg total) by mouth daily with breakfast.    Dispense:  90 tablet    Refill:  1  . pantoprazole (PROTONIX) 40 MG tablet    Sig: Take 1 tablet (40 mg total) by mouth daily.    Dispense:  30 tablet    Refill:  3  . traMADol (ULTRAM) 50 MG tablet    Sig: Take 1 tablet (50 mg total) by mouth every 12 (twelve) hours as needed for up to 3 days.    Dispense:  6 tablet    Refill:  0   Urgent venous Doppler this afternoon to rule out DVT.  Protonix and discontinued ranitidine.  I recommended the patient working with nutritionist.  Continue Prozac at 20 mg and consider augmentation next visit if depression is not improved.  Encourage patient to be compliant with weekly with vitamin D and take her other medicines as directed.  Urgent venous Doppler studies were negative.  Patient requested acute pain medicine.  Sent her in a very brief course of Ultram.  She will otherwise use his Tylenol.  Advised her to elevate her leg and use gentle heat.  Follow-up: Return in about 1 month (around 09/18/2018).  Libby Maw, MD

## 2018-08-18 NOTE — Telephone Encounter (Signed)
Copied from [place]. Topic: Quick Communication - See Telephone Encounter >> Aug 18, 2018  3:59 PM Antonieta Iba C wrote: CRM for notification. See Telephone encounter for: 08/18/18.  Pt called in to request a pain medication. Pt says that she was seen today.    Please advise.    Pharmacy: [id number] - [place], [place] - [address].

## 2018-08-18 NOTE — Telephone Encounter (Signed)
I left patient a voicemail letting her know the medication was sent in & also sent a mychart message with the response below.

## 2018-08-18 NOTE — Telephone Encounter (Signed)
Very brief course of ultram sent to pharm. She will otherwise use tylenol, elevation and gentle heat.

## 2018-08-19 LAB — IRON,TIBC AND FERRITIN PANEL
%SAT: 13 % (calc) — ABNORMAL LOW (ref 16–45)
Ferritin: 10 ng/mL — ABNORMAL LOW (ref 16–232)
Iron: 46 ug/dL (ref 40–190)
TIBC: 360 mcg/dL (calc) (ref 250–450)

## 2018-08-29 ENCOUNTER — Ambulatory Visit: Payer: BLUE CROSS/BLUE SHIELD | Admitting: Family Medicine

## 2018-08-29 ENCOUNTER — Encounter (HOSPITAL_COMMUNITY): Payer: Self-pay | Admitting: Emergency Medicine

## 2018-08-29 ENCOUNTER — Other Ambulatory Visit: Payer: Self-pay

## 2018-08-29 ENCOUNTER — Emergency Department (HOSPITAL_COMMUNITY)
Admission: EM | Admit: 2018-08-29 | Discharge: 2018-08-30 | Disposition: A | Discharge status: Home / Self Care | Payer: BLUE CROSS/BLUE SHIELD | Attending: Emergency Medicine | Admitting: Emergency Medicine

## 2018-08-29 DIAGNOSIS — R1310 Dysphagia, unspecified: Secondary | ICD-10-CM | POA: Diagnosis not present

## 2018-08-29 DIAGNOSIS — Z87891 Personal history of nicotine dependence: Secondary | ICD-10-CM | POA: Diagnosis not present

## 2018-08-29 DIAGNOSIS — Z79899 Other long term (current) drug therapy: Secondary | ICD-10-CM | POA: Insufficient documentation

## 2018-08-29 DIAGNOSIS — J45909 Unspecified asthma, uncomplicated: Secondary | ICD-10-CM | POA: Diagnosis not present

## 2018-08-29 DIAGNOSIS — R42 Dizziness and giddiness: Secondary | ICD-10-CM | POA: Diagnosis not present

## 2018-08-29 DIAGNOSIS — I129 Hypertensive chronic kidney disease with stage 1 through stage 4 chronic kidney disease, or unspecified chronic kidney disease: Secondary | ICD-10-CM | POA: Insufficient documentation

## 2018-08-29 DIAGNOSIS — M542 Cervicalgia: Secondary | ICD-10-CM | POA: Diagnosis not present

## 2018-08-29 DIAGNOSIS — J029 Acute pharyngitis, unspecified: Secondary | ICD-10-CM | POA: Diagnosis not present

## 2018-08-29 DIAGNOSIS — N189 Chronic kidney disease, unspecified: Secondary | ICD-10-CM | POA: Diagnosis not present

## 2018-08-29 MED ORDER — ACETAMINOPHEN 325 MG PO TABS: 650.0000 mg | ORAL_TABLET | Freq: Once | ORAL | Status: DC | PRN

## 2018-08-29 MED ORDER — IBUPROFEN 200 MG PO TABS
600.0000 mg | ORAL_TABLET | Freq: Once | ORAL | Status: AC
  Administered 2018-08-29: 600 mg via ORAL
  Filled 2018-08-29: qty 3

## 2018-08-29 MED ORDER — DEXAMETHASONE SODIUM PHOSPHATE 10 MG/ML IJ SOLN
10.0000 mg | Freq: Once | INTRAMUSCULAR | Status: AC
  Administered 2018-08-29: 10 mg via INTRAMUSCULAR
  Filled 2018-08-29: qty 1

## 2018-08-29 MED ORDER — MAGIC MOUTHWASH
10.0000 mL | Freq: Once | ORAL | Status: AC
  Administered 2018-08-30: 10 mL via ORAL
  Filled 2018-08-29: qty 10

## 2018-08-29 NOTE — ED Notes (Signed)
Pt was given ice water to drink. Pt is able to swallow her ice water at this time. Pt is able to speak in full sentences at this time.

## 2018-08-29 NOTE — Progress Notes (Deleted)
Victoria Moore - 42 y.o. female MRN [id number]  Date of birth: 1976-01-25  SUBJECTIVE:  Including CC & ROS.  No chief complaint on file.   Victoria Moore is a 42 y.o. female that is  ***.  ***   Review of Systems  HISTORY: Past Medical, Surgical, Social, and Family History Reviewed & Updated per EMR.   Pertinent Historical Findings include:  Past Medical History:  Diagnosis Date  . Allergy   . Anemia   . Anxiety   . Arthritis   . Asthma   . Blood transfusion without reported diagnosis   . Chronic kidney disease   . Common migraine with intractable migraine 10/07/2016  . Depression   . GERD (gastroesophageal reflux disease)   . Hypertension   . Migraine   . Sarcoidosis   . Seizures ([place])    last one year ago    Past Surgical History:  Procedure Laterality Date  . ABDOMINAL HYSTERECTOMY    . LAPAROSCOPIC OVARIAN CYSTECTOMY Left 03/11/2016   Procedure: LAPAROSCOPIC OVARIAN CYSTECTOMY;  Surgeon: Eldred Manges, MD;  Location: [facility];  Service: Gynecology;  Laterality: Left;  . LAPAROSCOPIC VAGINAL HYSTERECTOMY WITH SALPINGECTOMY Bilateral 03/11/2016   Procedure: LAPAROSCOPIC ASSISTED VAGINAL HYSTERECTOMY WITH SALPINGECTOMY;  Surgeon: Eldred Manges, MD;  Location: [facility];  Service: Gynecology;  Laterality: Bilateral;  . TUBAL LIGATION      Allergies  Allergen Reactions  . Aspirin Anaphylaxis and Hives         Family History  Adopted: Yes  Problem Relation Age of Onset  . Other Son        Growing pains  . Asthma Mother   . Heart Problems Mother   . Migraines Mother   . Diabetes Father   . Peptic Ulcer Father   . Heart Problems Father      Social History   Socioeconomic History  . Marital status: Single    Spouse name: Not on file  . Number of children: 1  . Years of education: 59  . Highest education level: Not on file  Occupational History  . Occupation: [employer]  . Financial resource strain: Not on file  . Food insecurity:      Worry: Not on file    Inability: Not on file  . Transportation needs:    Medical: Not on file    Non-medical: Not on file  Tobacco Use  . Smoking status: Former Smoker    Packs/day: 0.25    Years: 17.00    Pack years: 4.25    Types: Cigarettes    Last attempt to quit: 03/02/2013    Years since quitting: 5.4  . Smokeless tobacco: Never Used  Substance and Sexual Activity  . Alcohol use: No    Alcohol/week: 0.0 standard drinks  . Drug use: No    Types: Marijuana    Comment: former - 6 yrs ago  . Sexual activity: Not on file  Lifestyle  . Physical activity:    Days per week: Not on file    Minutes per session: Not on file  . Stress: Not on file  Relationships  . Social connections:    Talks on phone: Not on file    Gets together: Not on file    Attends religious service: Not on file    Active member of club or organization: Not on file    Attends meetings of clubs or organizations: Not on file    Relationship status: Not on file  .  Intimate partner violence:    Fear of current or ex partner: Not on file    Emotionally abused: Not on file    Physically abused: Not on file    Forced sexual activity: Not on file  Other Topics Concern  . Not on file  Social History Narrative   She lives w/ her fiance'. She has one son.    Highest level of education:  12th grade, did not graduate. Currently back in school.   Right-handed   Caffeine: 1 cup of coffee some mornings     PHYSICAL EXAM:  VS: LMP 02/19/2016  Physical Exam Gen: NAD, alert, cooperative with exam, well-appearing ENT: normal lips, normal nasal mucosa,  Eye: normal EOM, normal conjunctiva and lids CV:  no edema, +2 pedal pulses   Resp: no accessory muscle use, non-labored,  GI: no masses or tenderness, no hernia  Skin: no rashes, no areas of induration  Neuro: normal tone, normal sensation to touch Psych:  normal insight, alert and oriented MSK:  ***      ASSESSMENT & PLAN:   No problem-specific  Assessment & Plan notes found for this encounter.

## 2018-08-29 NOTE — ED Triage Notes (Signed)
Patient states about 4 pm her throat is hurting and feels like she is having trouble swallowing. Patient states the back of her neck hurts. She also is feeling dizzy. Patients O2 SAT is 100% on room air.

## 2018-08-29 NOTE — ED Notes (Signed)
I attempted to collect labs and was unsuccessful. 

## 2018-08-31 ENCOUNTER — Ambulatory Visit: Payer: BLUE CROSS/BLUE SHIELD | Admitting: Gastroenterology

## 2018-08-31 ENCOUNTER — Encounter: Payer: Self-pay | Admitting: Family Medicine

## 2018-08-31 ENCOUNTER — Ambulatory Visit: Payer: BLUE CROSS/BLUE SHIELD | Admitting: Family Medicine

## 2018-08-31 VITALS — BP 132/82 | HR 85 | Ht 72.0 in

## 2018-08-31 DIAGNOSIS — F321 Major depressive disorder, single episode, moderate: Secondary | ICD-10-CM

## 2018-08-31 DIAGNOSIS — E559 Vitamin D deficiency, unspecified: Secondary | ICD-10-CM | POA: Diagnosis not present

## 2018-08-31 DIAGNOSIS — M545 Low back pain, unspecified: Secondary | ICD-10-CM

## 2018-08-31 DIAGNOSIS — E611 Iron deficiency: Secondary | ICD-10-CM | POA: Diagnosis not present

## 2018-08-31 MED ORDER — CYCLOBENZAPRINE HCL 10 MG PO TABS: 10.0000 mg | ORAL_TABLET | Freq: Every day | ORAL | 0 refills | Status: DC

## 2018-08-31 MED ORDER — VITAMIN D (ERGOCALCIFEROL) 1.25 MG (50000 UNIT) PO CAPS: ORAL_CAPSULE | ORAL | 3 refills | Status: DC

## 2018-08-31 MED ORDER — FERROUS SULFATE 325 (65 FE) MG PO TBEC: 325.0000 mg | DELAYED_RELEASE_TABLET | Freq: Every day | ORAL | 1 refills | Status: DC

## 2018-08-31 NOTE — Patient Instructions (Signed)
Back Exercises If you have pain in your back, do these exercises 2-3 times each day or as told by your doctor. When the pain goes away, do the exercises once each day, but repeat the steps more times for each exercise (do more repetitions). If you do not have pain in your back, do these exercises once each day or as told by your doctor. Exercises Single Knee to Chest  Do these steps 3-5 times in a row for each leg: 1. Lie on your back on a firm bed or the floor with your legs stretched out. 2. Bring one knee to your chest. 3. Hold your knee to your chest by grabbing your knee or thigh. 4. Pull on your knee until you feel a gentle stretch in your lower back. 5. Keep doing the stretch for 10-30 seconds. 6. Slowly let go of your leg and straighten it.  Pelvic Tilt  Do these steps 5-10 times in a row: 1. Lie on your back on a firm bed or the floor with your legs stretched out. 2. Bend your knees so they point up to the ceiling. Your feet should be flat on the floor. 3. Tighten your lower belly (abdomen) muscles to press your lower back against the floor. This will make your tailbone point up to the ceiling instead of pointing down to your feet or the floor. 4. Stay in this position for 5-10 seconds while you gently tighten your muscles and breathe evenly.  Cat-Cow  Do these steps until your lower back bends more easily: 1. Get on your hands and knees on a firm surface. Keep your hands under your shoulders, and keep your knees under your hips. You may put padding under your knees. 2. Let your head hang down, and make your tailbone point down to the floor so your lower back is round like the back of a cat. 3. Stay in this position for 5 seconds. 4. Slowly lift your head and make your tailbone point up to the ceiling so your back hangs low (sags) like the back of a cow. 5. Stay in this position for 5 seconds.  Press-Ups  Do these steps 5-10 times in a row: 1. Lie on your belly (face-down)  on the floor. 2. Place your hands near your head, about shoulder-width apart. 3. While you keep your back relaxed and keep your hips on the floor, slowly straighten your arms to raise the top half of your body and lift your shoulders. Do not use your back muscles. To make yourself more comfortable, you may change where you place your hands. 4. Stay in this position for 5 seconds. 5. Slowly return to lying flat on the floor.  Bridges  Do these steps 10 times in a row: 1. Lie on your back on a firm surface. 2. Bend your knees so they point up to the ceiling. Your feet should be flat on the floor. 3. Tighten your butt muscles and lift your butt off of the floor until your waist is almost as high as your knees. If you do not feel the muscles working in your butt and the back of your thighs, slide your feet 1-2 inches farther away from your butt. 4. Stay in this position for 3-5 seconds. 5. Slowly lower your butt to the floor, and let your butt muscles relax.  If this exercise is too easy, try doing it with your arms crossed over your chest. Belly Crunches  Do these steps 5-10 times in   a row: 1. Lie on your back on a firm bed or the floor with your legs stretched out. 2. Bend your knees so they point up to the ceiling. Your feet should be flat on the floor. 3. Cross your arms over your chest. 4. Tip your chin a little bit toward your chest but do not bend your neck. 5. Tighten your belly muscles and slowly raise your chest just enough to lift your shoulder blades a tiny bit off of the floor. 6. Slowly lower your chest and your head to the floor.  Back Lifts Do these steps 5-10 times in a row: 1. Lie on your belly (face-down) with your arms at your sides, and rest your forehead on the floor. 2. Tighten the muscles in your legs and your butt. 3. Slowly lift your chest off of the floor while you keep your hips on the floor. Keep the back of your head in line with the curve in your back. Look at  the floor while you do this. 4. Stay in this position for 3-5 seconds. 5. Slowly lower your chest and your face to the floor.  Contact a doctor if:  Your back pain gets a lot worse when you do an exercise.  Your back pain does not lessen 2 hours after you exercise. If you have any of these problems, stop doing the exercises. Do not do them again unless your doctor says it is okay. Get help right away if:  You have sudden, very bad back pain. If this happens, stop doing the exercises. Do not do them again unless your doctor says it is okay. This information is not intended to replace advice given to you by your health care provider. Make sure you discuss any questions you have with your health care provider. Document Released: 01/15/2011 Document Revised: 05/20/2016 Document Reviewed: 02/06/2015 Elsevier Interactive Patient Education  2018 [employer].  Major Depressive Disorder, Adult Major depressive disorder (MDD) is a mental health condition. MDD often makes you feel sad, hopeless, or helpless. MDD can also cause symptoms in your body. MDD can affect your:  Work.  School.  Relationships.  Other normal activities.  MDD can range from mild to very bad. It may occur once (single episode MDD). It can also occur many times (recurrent MDD). The main symptoms of MDD often include:  Feeling sad, depressed, or irritable most of the time.  Loss of interest.  MDD symptoms also include:  Sleeping too much or too little.  Eating too much or too little.  A change in your weight.  Feeling tired (fatigue) or having low energy.  Feeling worthless.  Feeling guilty.  Trouble making decisions.  Trouble thinking clearly.  Thoughts of suicide or harming others.  Feeling weak.  Feeling agitated.  Keeping yourself from being around other people (isolation).  Follow these instructions at home: Activity  Do these things as told by your doctor: ? Go back to your normal  activities. ? Exercise regularly. ? Spend time outdoors. Alcohol  Talk with your doctor about how alcohol can affect your antidepressant medicines.  Do not drink alcohol. Or, limit how much alcohol you drink. ? This means no more than 1 drink a day for nonpregnant women and 2 drinks a day for men. One drink equals one of these:  12 oz of beer.  5 oz of wine.  1 oz of hard liquor. General instructions  Take over-the-counter and prescription medicines only as told by your doctor.  Eat  a healthy diet.  Get plenty of sleep.  Find activities that you enjoy. Make time to do them.  Think about joining a support group. Your doctor may be able to suggest a group for you.  Keep all follow-up visits as told by your doctor. This is important. Where to find more information:  [employer] on Mental Illness: ? www.nami.[place]: ? https://carter.com/  National Suicide Prevention Lifeline: ? (256) 794-2622. This is free, 24-hour help. Contact a doctor if:  Your symptoms get worse.  You have new symptoms. Get help right away if:  You self-harm.  You see, hear, taste, smell, or feel things that are not present (hallucinate). If you ever feel like you may hurt yourself or others, or have thoughts about taking your own life, get help right away. You can go to your nearest emergency department or call:  Your local emergency services (911 in the [place]  A suicide crisis helpline, such as the National Suicide Prevention Lifeline: ? 956-758-0900. This is open 24 hours a day.  This information is not intended to replace advice given to you by your health care provider. Make sure you discuss any questions you have with your health care provider. Document Released: 11/24/2015 Document Revised: 08/29/2016 Document Reviewed: 08/29/2016 Elsevier Interactive Patient Education  2017 [employer].

## 2018-08-31 NOTE — ED Provider Notes (Signed)
[facility] DEPT Provider Note   CSN: [id number] Arrival date & time: 08/29/18  1848     History   Chief Complaint Chief Complaint  Patient presents with  . Dizziness  . trouble swallowing  . Neck Pain    HPI Victoria Moore is a 42 y.o. female.  HPI  42 year old female comes in with chief complaint of dizziness, trouble swallowing and neck pain. Patient reports that her pain started around 4 PM.  Patient has history of sarcoidosis, CKD, seizures.  She has no history of similar symptoms.  Patient denies nausea, vomiting, fevers, chills, headaches.  There is no posterior neck pain or posterior headache.  Patient dizziness is intermittent. Pt has no hx of PE, DVT and denies any exogenous hormone (testosterone / estrogen) use, long distance travels or surgery in the past 6 weeks, active cancer, recent immobilization.   Past Medical History:  Diagnosis Date  . Allergy   . Anemia   . Anxiety   . Arthritis   . Asthma   . Blood transfusion without reported diagnosis   . Chronic kidney disease   . Common migraine with intractable migraine 10/07/2016  . Depression   . GERD (gastroesophageal reflux disease)   . Hypertension   . Migraine   . Sarcoidosis   . Seizures ([place])    last one year ago    Patient Active Problem List   Diagnosis Date Noted  . Right leg swelling 08/18/2018  . OSA (obstructive sleep apnea) 08/03/2018  . Hypokalemia 06/28/2018  . Medication side effect 06/22/2018  . Midline low back pain without sciatica 06/22/2018  . Personal history of sarcoidosis 05/26/2018  . Vitamin D deficiency 05/26/2018  . Encounter for health maintenance examination with abnormal findings 05/26/2018  . Depression, major, single episode, moderate ([place]) 05/26/2018  . Urinary tract infection without hematuria 05/26/2018  . Daytime sleepiness 05/26/2018  . Chest wall pain 05/26/2018  . GERD (gastroesophageal reflux disease) 05/26/2018  . Essential  hypertension 11/15/2016  . 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 Morbid (severe) obesity due to excess calories ([place]) 10/23/2016  . Cough variant asthma vs UACS  10/23/2016  . Common migraine with intractable migraine 10/07/2016  . Dyspnea 04/15/2016  . Hilar adenopathy 04/15/2016  . Fibroids 03/11/2016  . Anemia 11/12/2014  . Symptomatic anemia 11/12/2014  . Menorrhagia 11/12/2014  . Iron deficiency 11/12/2014    Past Surgical History:  Procedure Laterality Date  . ABDOMINAL HYSTERECTOMY    . LAPAROSCOPIC OVARIAN CYSTECTOMY Left 03/11/2016   Procedure: LAPAROSCOPIC OVARIAN CYSTECTOMY;  Surgeon: Eldred Manges, MD;  Location: [facility];  Service: Gynecology;  Laterality: Left;  . LAPAROSCOPIC VAGINAL HYSTERECTOMY WITH SALPINGECTOMY Bilateral 03/11/2016   Procedure: LAPAROSCOPIC ASSISTED VAGINAL HYSTERECTOMY WITH SALPINGECTOMY;  Surgeon: Eldred Manges, MD;  Location: [facility];  Service: Gynecology;  Laterality: Bilateral;  . TUBAL LIGATION       OB History    Gravida  1   Para      Term      Preterm      AB      Living        SAB      TAB      Ectopic      Multiple      Live Births               Home Medications    Prior to Admission medications   Medication Sig Start Date End Date Taking? Authorizing Provider  albuterol (PROVENTIL HFA;VENTOLIN HFA) 108 (90 Base) MCG/ACT inhaler Inhale 1-2 puffs into the lungs every 4 (four) hours as needed for wheezing or shortness of breath (cough). 01/06/18  Yes Gildardo Pounds, NP  ALPRAZolam Duanne Moron) 1 MG tablet Take 0.5-1 tablets (0.5-1 mg total) by mouth daily as needed for anxiety. 05/26/18  Yes Libby Maw, MD  carvedilol (COREG CR) 20 MG 24 hr capsule TAKE 1 CAPSULE BY MOUTH EVERY DAY 06/30/18  Yes Libby Maw, MD  chlorthalidone (HYGROTON) 25 MG tablet Take 1 tablet (25 mg total) by mouth daily. 05/26/18  Yes Libby Maw, MD  ferrous sulfate 325 (65 FE) MG tablet Take 1 tablet (325  mg total) by mouth daily with breakfast. Patient taking differently: Take 325 mg by mouth 2 (two) times daily with a meal.  08/18/18  Yes Libby Maw, MD  FLUoxetine (PROZAC) 20 MG tablet Take 1 tablet (20 mg total) by mouth daily. 08/18/18  Yes Libby Maw, MD  ibuprofen (ADVIL,MOTRIN) 800 MG tablet Take 1 tablet (800 mg total) by mouth 3 (three) times daily. 07/08/18  Yes Wieters, Hallie C, PA-C  mometasone-formoterol (DULERA) 100-5 MCG/ACT AERO Inhale 2 puffs into the lungs 2 (two) times daily. 11/24/17  Yes Freeman Caldron M, PA-C  pantoprazole (PROTONIX) 40 MG tablet Take 1 tablet (40 mg total) by mouth daily. 08/18/18  Yes Libby Maw, MD  Potassium Chloride ER 20 MEQ TBCR Take one daily. 06/28/18  Yes Libby Maw, MD  cyclobenzaprine (FLEXERIL) 10 MG tablet Take 1 tablet (10 mg total) by mouth at bedtime. As needed for back pain. 08/31/18   Libby Maw, MD  ferrous sulfate 325 (65 FE) MG EC tablet Take 1 tablet (325 mg total) by mouth daily with breakfast. 08/31/18   Libby Maw, MD  Vitamin D, Ergocalciferol, (DRISDOL) 50000 units CAPS capsule Take one pill every 3 days. 08/31/18   Libby Maw, MD    Family History Family History  Adopted: Yes  Problem Relation Age of Onset  . Other Son        Growing pains  . Asthma Mother   . Heart Problems Mother   . Migraines Mother   . Diabetes Father   . Peptic Ulcer Father   . Heart Problems Father     Social History Social History   Tobacco Use  . Smoking status: Former Smoker    Packs/day: 0.25    Years: 17.00    Pack years: 4.25    Types: Cigarettes    Last attempt to quit: 03/02/2013    Years since quitting: 5.5  . Smokeless tobacco: Never Used  Substance Use Topics  . Alcohol use: No    Alcohol/week: 0.0 standard drinks  . Drug use: No    Types: Marijuana    Comment: former - 6 yrs ago     Allergies   Aspirin   Review of Systems Review of Systems   All other systems reviewed and are negative.    Physical Exam Updated Vital Signs BP 135/83 (BP Location: Left Arm)   Pulse 80   Temp 99.2 F (37.3 C) (Oral)   Resp 17   Ht 6' (1.829 m)   Wt (!) 141.5 kg   LMP 02/19/2016   SpO2 100%   BMI 42.31 kg/m   Physical Exam  Constitutional: She is oriented to person, place, and time. She appears well-developed.  HENT:  Head: Normocephalic and atraumatic.  Eyes: EOM are normal.  Neck: Normal range of motion. Neck supple.  Cardiovascular: Normal rate.  Pulmonary/Chest: Effort normal.  Abdominal: Bowel sounds are normal.  Neurological: She is alert and oriented to person, place, and time.  Cerebellar exam is normal (finger to nose) Sensory exam normal for bilateral upper and lower extremities - and patient is able to discriminate between sharp and dull. Motor exam is 4+/5   Skin: Skin is warm and dry.  Nursing note and vitals reviewed.    ED Treatments / Results  Labs (all labs ordered are listed, but only abnormal results are displayed) Labs Reviewed - No data to display  EKG EKG Interpretation  Date/Time:  Tuesday August 29 2018 20:38:35 EDT Ventricular Rate:  69 PR Interval:    QRS Duration: 97 QT Interval:  429 QTC Calculation: 460 R Axis:   47 Text Interpretation:  Sinus rhythm Nonspecific T abnormalities, anterior leads No acute changes No significant change since last tracing Confirmed by Varney Biles [phone]) on 08/29/2018 11:39:11 PM Also confirmed by Varney Biles [phone]), editor Lynder Parents [phone])  on 08/30/2018 8:28:43 AM   Radiology No results found.  Procedures Procedures (including critical care time)  Medications Ordered in ED Medications  dexamethasone (DECADRON) injection 10 mg (10 mg Intramuscular Given 08/29/18 2353)  magic mouthwash (10 mLs Oral Given 08/30/18 0022)  ibuprofen (ADVIL,MOTRIN) tablet 600 mg (600 mg Oral Given 08/29/18 2352)     Initial Impression / Assessment and Plan /  ED Course  I have reviewed the triage vital signs and the nursing notes.  Pertinent labs & imaging results that were available during my care of the patient were reviewed by me and considered in my medical decision making (see chart for details).     Patient comes with chief complaint of sore throat.  She is also having dizziness and neck pain.  She has history of sarcoidosis and other medical problems.  Her exam overall is reassuring. Neurologic exam is also nonfocal.  This could be sarcoidosis related discomfort.  I do not think patient is having posterior stroke or vertebrobasilar dissection.  Final Clinical Impressions(s) / ED Diagnoses   Final diagnoses:  Pharyngitis, unspecified etiology    ED Discharge Orders    None       Varney Biles, MD 09/01/18 [phone]

## 2018-08-31 NOTE — Progress Notes (Signed)
Subjective:  Patient ID: Victoria Moore, female    DOB: [birth year]  Age: 42 y.o. MRN: [id number]  CC: Back Pain (radiates down, neck pain, swollen glands along neck)   HPI Victoria Moore presents for evaluation of her lower back pain.  Pain is midline and does not radiate.  She has had no incontinence of her bowel or bladder function.  There is been no numbness tingling or weakness.  She was scheduled to see sports medicine and had to reschedule.  She has been rescheduled for the first part of October she tells me.  We had a discussion about her dropping vitamin D levels.  She assures me she has been taking the high-dose 50,000 unit vitamin D pill weekly.  Her iron levels remain depressed and she assures me that she has been taking iron pills.  She assures me that she has been taking the Prozac just feels a little depressed this week.  She was seen in the emergency room for sore throat.  Medical record was reviewed and her throat culture was negative.  Throat is better at this time.  She was referred to see the medical nutritionist.  They called her and she had to reschedule that appointment.  Her new appointment is pending.  Outpatient Medications Prior to Visit  Medication Sig Dispense Refill  . albuterol (PROVENTIL HFA;VENTOLIN HFA) 108 (90 Base) MCG/ACT inhaler Inhale 1-2 puffs into the lungs every 4 (four) hours as needed for wheezing or shortness of breath (cough). 1 Inhaler 1  . ALPRAZolam (XANAX) 1 MG tablet Take 0.5-1 tablets (0.5-1 mg total) by mouth daily as needed for anxiety. 30 tablet 0  . carvedilol (COREG CR) 20 MG 24 hr capsule TAKE 1 CAPSULE BY MOUTH EVERY DAY 90 capsule 0  . chlorthalidone (HYGROTON) 25 MG tablet Take 1 tablet (25 mg total) by mouth daily. 90 tablet 0  . ferrous sulfate 325 (65 FE) MG tablet Take 1 tablet (325 mg total) by mouth daily with breakfast. (Patient taking differently: Take 325 mg by mouth 2 (two) times daily with a meal. ) 90 tablet 1  . FLUoxetine  (PROZAC) 20 MG tablet Take 1 tablet (20 mg total) by mouth daily. 90 tablet 0  . ibuprofen (ADVIL,MOTRIN) 800 MG tablet Take 1 tablet (800 mg total) by mouth 3 (three) times daily. 21 tablet 0  . mometasone-formoterol (DULERA) 100-5 MCG/ACT AERO Inhale 2 puffs into the lungs 2 (two) times daily. 1 Inhaler 5  . pantoprazole (PROTONIX) 40 MG tablet Take 1 tablet (40 mg total) by mouth daily. 30 tablet 3  . Potassium Chloride ER 20 MEQ TBCR Take one daily. 30 tablet 2  . Vitamin D, Ergocalciferol, (DRISDOL) 50000 units CAPS capsule Take 1 capsule (50,000 Units total) by mouth every 7 (seven) days. 5 capsule 5   No facility-administered medications prior to visit.     ROS Review of Systems  Constitutional: Negative for chills, fatigue, fever and unexpected weight change.  HENT: Negative.  Negative for sore throat.   Eyes: Negative for photophobia and visual disturbance.  Respiratory: Negative.   Cardiovascular: Negative.   Gastrointestinal: Negative.   Endocrine: Negative for polyphagia and polyuria.  Genitourinary: Negative.   Musculoskeletal: Positive for back pain. Negative for myalgias.  Skin: Negative for pallor and rash.  Allergic/Immunologic: Negative for immunocompromised state.  Neurological: Negative for weakness and numbness.  Hematological: Does not bruise/bleed easily.  Psychiatric/Behavioral: Negative for behavioral problems and confusion.    Objective:  BP 132/82  Pulse 85   Ht 6' (1.829 m)   LMP 02/19/2016   SpO2 98%   BMI 42.31 kg/m   BP Readings from Last 3 Encounters:  08/31/18 132/82  08/29/18 135/83  08/18/18 124/78    Wt Readings from Last 3 Encounters:  08/29/18 (!) 312 lb (141.5 kg)  08/18/18 (!) 319 lb (144.7 kg)  06/22/18 (!) 307 lb 8 oz (139.5 kg)    Physical Exam  Constitutional: She is oriented to person, place, and time. She appears well-developed and well-nourished. No distress.  HENT:  Head: Normocephalic and atraumatic.  Right Ear:  External ear normal.  Left Ear: External ear normal.  Mouth/Throat: Oropharynx is clear and moist. No oropharyngeal exudate.  Eyes: Pupils are equal, round, and reactive to light. Conjunctivae and EOM are normal. Right eye exhibits no discharge. Left eye exhibits no discharge. No scleral icterus.  Neck: Neck supple. No JVD present. No tracheal deviation present. No thyromegaly present.  Cardiovascular: Normal rate, regular rhythm and normal heart sounds.  Pulmonary/Chest: Effort normal and breath sounds normal.  Abdominal: Bowel sounds are normal.  Musculoskeletal:       Lumbar back: She exhibits decreased range of motion and tenderness.  Lymphadenopathy:    She has no cervical adenopathy.  Neurological: She is alert and oriented to person, place, and time. She has normal strength.  Reflex Scores:      Patellar reflexes are 1+ on the right side and 1+ on the left side.      Achilles reflexes are 1+ on the right side and 1+ on the left side. Negative dural tension signs.   Skin: Skin is warm and dry. She is not diaphoretic.  Psychiatric: She has a normal mood and affect. Her behavior is normal.    Lab Results  Component Value Date   WBC 5.0 08/18/2018   HGB 11.4 (L) 08/18/2018   HCT 34.7 (L) 08/18/2018   PLT 190.0 08/18/2018   GLUCOSE 109 (H) 06/27/2018   CHOL 178 06/27/2018   TRIG 63.0 06/27/2018   HDL 49.90 06/27/2018   LDLCALC 115 (H) 06/27/2018   ALT 13 06/27/2018   AST 14 06/27/2018   NA 139 06/27/2018   K 3.2 (L) 06/27/2018   CL 100 06/27/2018   CREATININE 0.97 06/27/2018   BUN 10 06/27/2018   CO2 32 06/27/2018   TSH 1.750 11/24/2017   INR 1.08 09/22/2015    No results found.  Assessment & Plan:   Victoria Moore was seen today for back pain.  Diagnoses and all orders for this visit:  Iron deficiency -     ferrous sulfate 325 (65 FE) MG EC tablet; Take 1 tablet (325 mg total) by mouth daily with breakfast.  Vitamin D deficiency -     Vitamin D, Ergocalciferol,  (DRISDOL) 50000 units CAPS capsule; Take one pill every 3 days.  Depression, major, single episode, moderate ([facility])  Midline low back pain without sciatica, unspecified chronicity -     cyclobenzaprine (FLEXERIL) 10 MG tablet; Take 1 tablet (10 mg total) by mouth at bedtime. As needed for back pain.   I have changed Victoria Moore's Vitamin D (Ergocalciferol). I am also having her start on cyclobenzaprine and ferrous sulfate. Additionally, I am having her maintain her mometasone-formoterol, albuterol, ALPRAZolam, chlorthalidone, Potassium Chloride ER, carvedilol, ibuprofen, FLUoxetine, ferrous sulfate, and pantoprazole.  Meds ordered this encounter  Medications  . cyclobenzaprine (FLEXERIL) 10 MG tablet    Sig: Take 1 tablet (10 mg total) by mouth at bedtime.  As needed for back pain.    Dispense:  30 tablet    Refill:  0  . Vitamin D, Ergocalciferol, (DRISDOL) 50000 units CAPS capsule    Sig: Take one pill every 3 days.    Dispense:  8 capsule    Refill:  3  . ferrous sulfate 325 (65 FE) MG EC tablet    Sig: Take 1 tablet (325 mg total) by mouth daily with breakfast.    Dispense:  90 tablet    Refill:  1   Urged to follow-up with sports medicine and with nutritionist.  Have increased vitamin D high dose to q. 3 days.  We wrote the prescription for iron pills.  Will continue Prozac.  Anticipatory guidance was given for back exercises major depressive disorder.  I do question compliance.  Follow-up in 2 months.  Follow-up: Return in about 2 months (around 10/31/2018).  Libby Maw, MD

## 2018-09-01 ENCOUNTER — Encounter (HOSPITAL_COMMUNITY): Payer: Self-pay

## 2018-09-01 ENCOUNTER — Ambulatory Visit (HOSPITAL_COMMUNITY)
Admission: EM | Admit: 2018-09-01 | Discharge: 2018-09-01 | Disposition: A | Discharge status: Home / Self Care | Payer: BLUE CROSS/BLUE SHIELD | Attending: Internal Medicine | Admitting: Internal Medicine

## 2018-09-01 DIAGNOSIS — M79672 Pain in left foot: Secondary | ICD-10-CM

## 2018-09-01 DIAGNOSIS — M79671 Pain in right foot: Secondary | ICD-10-CM

## 2018-09-01 MED ORDER — GABAPENTIN 100 MG PO CAPS: 100.0000 mg | ORAL_CAPSULE | Freq: Three times a day (TID) | ORAL | 0 refills | Status: DC

## 2018-09-01 MED ORDER — KETOROLAC TROMETHAMINE 60 MG/2ML IM SOLN
INTRAMUSCULAR | Status: AC
  Filled 2018-09-01: qty 2

## 2018-09-01 MED ORDER — KETOROLAC TROMETHAMINE 60 MG/2ML IM SOLN
60.0000 mg | Freq: Once | INTRAMUSCULAR | Status: AC
  Administered 2018-09-01: 60 mg via INTRAMUSCULAR

## 2018-09-01 NOTE — Discharge Instructions (Signed)
We gave you a shot of Toradol today, this should begin working in 30 to 40 minutes Please begin taking gabapentin 100 mg 3 times a day, may take up to 300-3 times a day  Please follow-up with rheumatology  Please return or go to emergency room if pain worsening, not improving, developing worsening loss of sensation or worsening pain

## 2018-09-01 NOTE — ED Triage Notes (Signed)
Pt presents with complaints of headache and foot pain. States her feet look discolored to her and are numb. Patient does have some bruising in her left greater toe. Cms intact. Some numbness in right foot.

## 2018-09-02 NOTE — ED Provider Notes (Signed)
[place]    CSN: [id number] Arrival date & time: 09/01/18  [place]     History   Chief Complaint Chief Complaint  Patient presents with  . Headache  . Foot Pain    HPI Victoria Moore is a 42 y.o. female history of RA, neuropathy, hypertension, vitamin D deficiency presenting today for evaluation of foot pain and numbness.  Patient states that she has had foot pain for the past week, over the past couple days she has had increased numbness as well as noticed discoloration to the bases of her great toes on both sides.  She denies any injury or dropping anything on her foot.  States that her left foot is worse than her right.  Patient notes that this feels slightly different than her typical RA pain.  Feels as if her foot is falling asleep, tingling burning sensation.  Denies calf swelling or calf pain.  Denies previous blood clot.  She takes meloxicam, which has not been helping her pain.  She denies taking anything for neuropathic pain.  She denies any fevers.  Denies history of circulation issues.  HPI  Past Medical History:  Diagnosis Date  . Allergy   . Anemia   . Anxiety   . Arthritis   . Asthma   . Blood transfusion without reported diagnosis   . Chronic kidney disease   . Common migraine with intractable migraine 10/07/2016  . Depression   . GERD (gastroesophageal reflux disease)   . Hypertension   . Migraine   . Sarcoidosis   . Seizures ([place])    last one year ago    Patient Active Problem List   Diagnosis Date Noted  . Right leg swelling 08/18/2018  . OSA (obstructive sleep apnea) 08/03/2018  . Hypokalemia 06/28/2018  . Medication side effect 06/22/2018  . Midline low back pain without sciatica 06/22/2018  . Personal history of sarcoidosis 05/26/2018  . Vitamin D deficiency 05/26/2018  . Encounter for health maintenance examination with abnormal findings 05/26/2018  . Depression, major, single episode, moderate ([place]) 05/26/2018  . Urinary tract  infection without hematuria 05/26/2018  . Daytime sleepiness 05/26/2018  . Chest wall pain 05/26/2018  . GERD (gastroesophageal reflux disease) 05/26/2018  . Essential hypertension 11/15/2016  . 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 Morbid (severe) obesity due to excess calories ([place]) 10/23/2016  . Cough variant asthma vs UACS  10/23/2016  . Common migraine with intractable migraine 10/07/2016  . Dyspnea 04/15/2016  . Hilar adenopathy 04/15/2016  . Fibroids 03/11/2016  . Anemia 11/12/2014  . Symptomatic anemia 11/12/2014  . Menorrhagia 11/12/2014  . Iron deficiency 11/12/2014    Past Surgical History:  Procedure Laterality Date  . ABDOMINAL HYSTERECTOMY    . LAPAROSCOPIC OVARIAN CYSTECTOMY Left 03/11/2016   Procedure: LAPAROSCOPIC OVARIAN CYSTECTOMY;  Surgeon: Eldred Manges, MD;  Location: [facility];  Service: Gynecology;  Laterality: Left;  . LAPAROSCOPIC VAGINAL HYSTERECTOMY WITH SALPINGECTOMY Bilateral 03/11/2016   Procedure: LAPAROSCOPIC ASSISTED VAGINAL HYSTERECTOMY WITH SALPINGECTOMY;  Surgeon: Eldred Manges, MD;  Location: [facility];  Service: Gynecology;  Laterality: Bilateral;  . TUBAL LIGATION      OB History    Gravida  1   Para      Term      Preterm      AB      Living        SAB      TAB      Ectopic  Multiple      Live Births               Home Medications    Prior to Admission medications   Medication Sig Start Date End Date Taking? Authorizing Provider  albuterol (PROVENTIL HFA;VENTOLIN HFA) 108 (90 Base) MCG/ACT inhaler Inhale 1-2 puffs into the lungs every 4 (four) hours as needed for wheezing or shortness of breath (cough). 01/06/18   Gildardo Pounds, NP  ALPRAZolam Duanne Moron) 1 MG tablet Take 0.5-1 tablets (0.5-1 mg total) by mouth daily as needed for anxiety. 05/26/18   Libby Maw, MD  carvedilol (COREG CR) 20 MG 24 hr capsule TAKE 1 CAPSULE BY MOUTH EVERY DAY 06/30/18   Libby Maw, MD  chlorthalidone  (HYGROTON) 25 MG tablet Take 1 tablet (25 mg total) by mouth daily. 05/26/18   Libby Maw, MD  cyclobenzaprine (FLEXERIL) 10 MG tablet Take 1 tablet (10 mg total) by mouth at bedtime. As needed for back pain. 08/31/18   Libby Maw, MD  ferrous sulfate 325 (65 FE) MG EC tablet Take 1 tablet (325 mg total) by mouth daily with breakfast. 08/31/18   Libby Maw, MD  ferrous sulfate 325 (65 FE) MG tablet Take 1 tablet (325 mg total) by mouth daily with breakfast. Patient taking differently: Take 325 mg by mouth 2 (two) times daily with a meal.  08/18/18   Libby Maw, MD  FLUoxetine (PROZAC) 20 MG tablet Take 1 tablet (20 mg total) by mouth daily. 08/18/18   Libby Maw, MD  gabapentin (NEURONTIN) 100 MG capsule Take 1 capsule (100 mg total) by mouth 3 (three) times daily. 09/01/18 10/01/18  Wieters, Hallie C, PA-C  ibuprofen (ADVIL,MOTRIN) 800 MG tablet Take 1 tablet (800 mg total) by mouth 3 (three) times daily. 07/08/18   Wieters, Hallie C, PA-C  mometasone-formoterol (DULERA) 100-5 MCG/ACT AERO Inhale 2 puffs into the lungs 2 (two) times daily. 11/24/17   Argentina Donovan, PA-C  pantoprazole (PROTONIX) 40 MG tablet Take 1 tablet (40 mg total) by mouth daily. 08/18/18   Libby Maw, MD  Potassium Chloride ER 20 MEQ TBCR Take one daily. 06/28/18   Libby Maw, MD  Vitamin D, Ergocalciferol, (DRISDOL) 50000 units CAPS capsule Take one pill every 3 days. 08/31/18   Libby Maw, MD    Family History Family History  Adopted: Yes  Problem Relation Age of Onset  . Other Son        Growing pains  . Asthma Mother   . Heart Problems Mother   . Migraines Mother   . Diabetes Father   . Peptic Ulcer Father   . Heart Problems Father     Social History Social History   Tobacco Use  . Smoking status: Former Smoker    Packs/day: 0.25    Years: 17.00    Pack years: 4.25    Types: Cigarettes    Last attempt to quit: 03/02/2013      Years since quitting: 5.5  . Smokeless tobacco: Never Used  Substance Use Topics  . Alcohol use: No    Alcohol/week: 0.0 standard drinks  . Drug use: No    Types: Marijuana    Comment: former - 6 yrs ago     Allergies   Aspirin   Review of Systems Review of Systems  Constitutional: Negative for fatigue and fever.  Eyes: Negative for visual disturbance.  Respiratory: Negative for shortness of breath.   Cardiovascular:  Negative for chest pain.  Gastrointestinal: Negative for abdominal pain, nausea and vomiting.  Musculoskeletal: Positive for arthralgias and gait problem. Negative for joint swelling.  Skin: Positive for color change. Negative for rash and wound.  Neurological: Negative for dizziness, weakness, light-headedness and headaches.     Physical Exam Triage Vital Signs ED Triage Vitals  Enc Vitals Group     BP 09/01/18 1909 133/85     Pulse Rate 09/01/18 1909 79     Resp 09/01/18 1909 18     Temp 09/01/18 1909 (!) 97 F (36.1 C)     Temp src --      SpO2 09/01/18 1909 100 %     Weight --      Height --      Head Circumference --      Peak Flow --      Pain Score 09/01/18 1908 8     Pain Loc --      Pain Edu? --      Excl. in [place]? --    No data found.  Updated Vital Signs BP 133/85   Pulse 79   Temp (!) 97 F (36.1 C)   Resp 18   LMP 02/19/2016   SpO2 100%   Visual Acuity Right Eye Distance:   Left Eye Distance:   Bilateral Distance:    Right Eye Near:   Left Eye Near:    Bilateral Near:     Physical Exam  Constitutional: She is oriented to person, place, and time. She appears well-developed and well-nourished.  Patient appears uncomfortable, tearful  HENT:  Head: Normocephalic and atraumatic.  Nose: Nose normal.  Eyes: Conjunctivae are normal.  Neck: Neck supple.  Cardiovascular: Normal rate.  Pulmonary/Chest: Effort normal. No respiratory distress.  Abdominal: She exhibits no distension.  Musculoskeletal: Normal range of motion.   No calf swelling or tenderness, no cord palpated, negative Homans bilaterally  Minimal swelling appreciated to bilateral feet, dorsalis pedis 1+ bilaterally, base of bilateral great toe with slight darkening near cuticle, tenderness to palpation all around this area, no overlying erythema, see pictures below  Neurological: She is alert and oriented to person, place, and time.  Sensation intact throughout dorsum of foot, toes and plantar surface of foot on right side, sensation intact on left side through dorsum and plantar surface, second through fourth toe, decreased sensation to distal aspect of left great toe  Skin: Skin is warm and dry.  Psychiatric: She has a normal mood and affect.  Nursing note and vitals reviewed.        UC Treatments / Results  Labs (all labs ordered are listed, but only abnormal results are displayed) Labs Reviewed - No data to display  EKG None  Radiology No results found.  Procedures Procedures (including critical care time)  Medications Ordered in UC Medications  ketorolac (TORADOL) injection 60 mg (60 mg Intramuscular Given 09/01/18 2011)    Initial Impression / Assessment and Plan / UC Course  I have reviewed the triage vital signs and the nursing notes.  Pertinent labs & imaging results that were available during my care of the patient were reviewed by me and considered in my medical decision making (see chart for details).     Pain described seems more neuropathic pain.  Discoloration does not appear to be subungual hematoma, no inciting injury.  Given pain bilateral, less likely ischemia or DVT.  Will provide patient with injection of Toradol in clinic today to help with pain, will  initiate on gabapentin as trial.  Follow-up with rheumatology.  Return if symptoms worsening, increased swelling, not improving developing increased numbness/lack of sensation.Discussed strict return precautions. Patient verbalized understanding and is agreeable  with plan.  Final Clinical Impressions(s) / UC Diagnoses   Final diagnoses:  Bilateral foot pain     Discharge Instructions     We gave you a shot of Toradol today, this should begin working in 30 to 40 minutes Please begin taking gabapentin 100 mg 3 times a day, may take up to 300-3 times a day  Please follow-up with rheumatology  Please return or go to emergency room if pain worsening, not improving, developing worsening loss of sensation or worsening pain   ED Prescriptions    Medication Sig Dispense Auth. Provider   gabapentin (NEURONTIN) 100 MG capsule Take 1 capsule (100 mg total) by mouth 3 (three) times daily. 90 capsule Wieters, Hallie C, PA-C     Controlled Substance Prescriptions [place] Controlled Substance Registry consulted? Not Applicable   Janith Lima, [place] 09/02/18 [phone]

## 2018-09-11 ENCOUNTER — Other Ambulatory Visit: Payer: Self-pay

## 2018-09-11 ENCOUNTER — Ambulatory Visit: Payer: BLUE CROSS/BLUE SHIELD | Admitting: Medical

## 2018-09-11 ENCOUNTER — Encounter: Payer: Self-pay | Admitting: Medical

## 2018-09-11 VITALS — BP 140/90 | HR 89 | Temp 97.8°F | Resp 16 | Ht 69.0 in | Wt 327.4 lb

## 2018-09-11 DIAGNOSIS — R350 Frequency of micturition: Secondary | ICD-10-CM

## 2018-09-11 DIAGNOSIS — R609 Edema, unspecified: Secondary | ICD-10-CM | POA: Diagnosis not present

## 2018-09-11 DIAGNOSIS — R635 Abnormal weight gain: Secondary | ICD-10-CM | POA: Diagnosis not present

## 2018-09-11 DIAGNOSIS — E559 Vitamin D deficiency, unspecified: Secondary | ICD-10-CM

## 2018-09-11 DIAGNOSIS — E876 Hypokalemia: Secondary | ICD-10-CM

## 2018-09-11 DIAGNOSIS — R7301 Impaired fasting glucose: Secondary | ICD-10-CM

## 2018-09-11 DIAGNOSIS — Z79899 Other long term (current) drug therapy: Secondary | ICD-10-CM | POA: Diagnosis not present

## 2018-09-11 DIAGNOSIS — M545 Low back pain, unspecified: Secondary | ICD-10-CM

## 2018-09-11 DIAGNOSIS — R51 Headache: Secondary | ICD-10-CM | POA: Diagnosis not present

## 2018-09-11 DIAGNOSIS — I129 Hypertensive chronic kidney disease with stage 1 through stage 4 chronic kidney disease, or unspecified chronic kidney disease: Secondary | ICD-10-CM | POA: Insufficient documentation

## 2018-09-11 DIAGNOSIS — N189 Chronic kidney disease, unspecified: Secondary | ICD-10-CM | POA: Insufficient documentation

## 2018-09-11 DIAGNOSIS — J45909 Unspecified asthma, uncomplicated: Secondary | ICD-10-CM | POA: Diagnosis not present

## 2018-09-11 DIAGNOSIS — R2243 Localized swelling, mass and lump, lower limb, bilateral: Secondary | ICD-10-CM | POA: Insufficient documentation

## 2018-09-11 DIAGNOSIS — E611 Iron deficiency: Secondary | ICD-10-CM

## 2018-09-11 DIAGNOSIS — Z87891 Personal history of nicotine dependence: Secondary | ICD-10-CM | POA: Insufficient documentation

## 2018-09-11 DIAGNOSIS — I10 Essential (primary) hypertension: Secondary | ICD-10-CM

## 2018-09-11 HISTORY — DX: Frequency of micturition: R35.0

## 2018-09-11 HISTORY — DX: Impaired fasting glucose: R73.01

## 2018-09-11 LAB — POCT URINALYSIS DIP (PROADVANTAGE DEVICE)
Bilirubin, UA: NEGATIVE
Blood, UA: NEGATIVE
Glucose, UA: NEGATIVE mg/dL
Ketones, POC UA: NEGATIVE mg/dL
Leukocytes, UA: NEGATIVE
Nitrite, UA: NEGATIVE
Protein Ur, POC: NEGATIVE mg/dL
Specific Gravity, Urine: 1.03
Urobilinogen, Ur: NEGATIVE
pH, UA: 5.5 (ref 5.0–8.0)

## 2018-09-11 NOTE — Progress Notes (Signed)
Subjective: Chief Complaint  Patient presents with  . np    feet, leg swollen and painful  back pain, knee pain   Here as a new patient.      Was seeing Dr. Ethelene Hal, [place] prior.    She has several c/o  She reports swelling and pain in both legs and feet, back pain, headaches.  She notes BPs are up and down, has hypertension.    Anxiety is out the roof. Worries about her finances, health issues, other.    Was seeing psychiatry, Dr. Alessandra Grout, in [place], [place] prior.   Was on adderall and xanax.     HTN - on BP medication, compliant with medication daily.  Been on current BP medication for a while now, no new changes.    Checks BPs at home, sees 140/90s up to 183 SBP.   DBP always in the 90s.  Swelling in legs for last 2 weeks, worsening, tight feeling.   Elevating legs, no compression hose.  Has fatigue, but no CP, no SOB.  compliant with chlorthalidone and potassium  Had sleep study this past year, not sure about results, never was prescribed CPAP.  Has large growth on right arm she wants removed  Has chronic pain, low back pain.   Asthma - compliant with preventative and acute medications   Past Medical History:  Diagnosis Date  . Allergy   . Anemia   . Anxiety   . Arthritis   . Asthma   . Blood transfusion without reported diagnosis   . Chronic kidney disease   . Common migraine with intractable migraine 10/07/2016  . Depression   . GERD (gastroesophageal reflux disease)   . Hypertension   . Migraine   . Sarcoidosis   . Seizures ([place])    last one year ago   Current Outpatient Medications on File Prior to Visit  Medication Sig Dispense Refill  . albuterol (PROVENTIL HFA;VENTOLIN HFA) 108 (90 Base) MCG/ACT inhaler Inhale 1-2 puffs into the lungs every 4 (four) hours as needed for wheezing or shortness of breath (cough). 1 Inhaler 1  . ALPRAZolam (XANAX) 1 MG tablet Take 0.5-1 tablets (0.5-1 mg total) by mouth daily as needed for anxiety. 30 tablet 0  .  amphetamine-dextroamphetamine (ADDERALL XR) 30 MG 24 hr capsule Take 30 mg by mouth daily.    . carvedilol (COREG CR) 20 MG 24 hr capsule TAKE 1 CAPSULE BY MOUTH EVERY DAY 90 capsule 0  . chlorthalidone (HYGROTON) 25 MG tablet Take 1 tablet (25 mg total) by mouth daily. 90 tablet 0  . ferrous sulfate 325 (65 FE) MG EC tablet Take 1 tablet (325 mg total) by mouth daily with breakfast. 90 tablet 1  . FLUoxetine (PROZAC) 20 MG tablet Take 1 tablet (20 mg total) by mouth daily. 90 tablet 0  . gabapentin (NEURONTIN) 100 MG capsule Take 1 capsule (100 mg total) by mouth 3 (three) times daily. 90 capsule 0  . ibuprofen (ADVIL,MOTRIN) 800 MG tablet Take 1 tablet (800 mg total) by mouth 3 (three) times daily. 21 tablet 0  . mometasone-formoterol (DULERA) 100-5 MCG/ACT AERO Inhale 2 puffs into the lungs 2 (two) times daily. 1 Inhaler 5  . pantoprazole (PROTONIX) 40 MG tablet Take 1 tablet (40 mg total) by mouth daily. 30 tablet 3  . Potassium Chloride ER 20 MEQ TBCR Take one daily. 30 tablet 2  . Vitamin D, Ergocalciferol, (DRISDOL) 50000 units CAPS capsule Take one pill every 3 days. 8 capsule 3  .  cyclobenzaprine (FLEXERIL) 10 MG tablet Take 1 tablet (10 mg total) by mouth at bedtime. As needed for back pain. (Patient not taking: Reported on 09/11/2018) 30 tablet 0   No current facility-administered medications on file prior to visit.    ROS as in subjective     Objective: BP 140/90   Pulse 89   Temp 97.8 F (36.6 C) (Oral)   Resp 16   Ht 5\' 9"  (1.753 m)   Wt (!) 327 lb 6.4 oz (148.5 kg)   LMP 02/19/2016   SpO2 98%   BMI 48.35 kg/m   Wt Readings from Last 3 Encounters:  09/11/18 (!) 327 lb 6.4 oz (148.5 kg)  08/29/18 (!) 312 lb (141.5 kg)  08/18/18 (!) 319 lb (144.7 kg)   BP Readings from Last 3 Encounters:  09/11/18 140/90  09/01/18 133/85  08/31/18 132/82   Gen: obese AA female, and Skin: right anterior upper arm with fleshy large pedunculated skin tag, stalk approx 1cm diameter,  but lesion itself 3cm diameter Lungs clear Heart possible 2/6 holostylic brief murmur, normal s1, s2, otherwise Ext: 1+ nonpitting edema bilat LE Pulses 2+ UE and LE Neck: supple, nontener, no thyromegaly, no mass, possible mild JVD    Assessment: Encounter Diagnoses  Name Primary?  . Essential hypertension, benign Yes  . Impaired fasting blood sugar   . Weight gain   . Edema, unspecified type   . Acute low back pain without sciatica, unspecified back pain laterality   . Frequency of urination   . Iron deficiency   . Hypokalemia   . Vitamin D deficiency      Plan: Reviewed recent labs and notes in the chart.   July 2019 CMET with elevated glucose, decreased potasium, decreased Hgb and WBC 06/2018. B12 and folate normal 06/2018,   Ferritin low % iron sat low in 07/2018.  Vit D low on recent labs  Doppler US 08/18/18 negative for DVT, right  Labs as below  Consider cardiology consult and /or choledochogram  HTN - pending labs will need to adjust BP medications  Edema - etiology unclear, need to rule out CFH.    Iron deficiency - compliant with iron recently started  Hypokalemia - compliant with potassium  Vit D deficiency - compliant with vit D  She can return for procedure visit for excision of right arm mass \ Cristal was seen today for np.  Diagnoses and all orders for this visit:  Essential hypertension, benign -     Brain natriuretic peptide -     TSH -     Basic metabolic panel  Impaired fasting blood sugar -     Hemoglobin A1c  Weight gain -     Brain natriuretic peptide -     TSH -     Basic metabolic panel  Edema, unspecified type -     Brain natriuretic peptide -     TSH -     Basic metabolic panel  Acute low back pain without sciatica, unspecified back pain laterality -     POCT Urinalysis DIP (Proadvantage Device)  Frequency of urination -     POCT Urinalysis DIP (Proadvantage Device)  Iron deficiency  Hypokalemia  Vitamin D  deficiency

## 2018-09-11 NOTE — ED Triage Notes (Signed)
Patient reports seen at [facility] earlier today.  Reports having bilateral leg swelling for 2 weeks.  Tonight with "woozy" feeling.

## 2018-09-12 ENCOUNTER — Emergency Department
Admission: EM | Admit: 2018-09-12 | Discharge: 2018-09-12 | Disposition: A | Discharge status: Home / Self Care | Payer: BLUE CROSS/BLUE SHIELD | Attending: Student in an Organized Health Care Education/Training Program | Admitting: Student in an Organized Health Care Education/Training Program

## 2018-09-12 ENCOUNTER — Other Ambulatory Visit: Payer: Self-pay | Admitting: Medical

## 2018-09-12 ENCOUNTER — Emergency Department: Payer: BLUE CROSS/BLUE SHIELD

## 2018-09-12 DIAGNOSIS — I10 Essential (primary) hypertension: Secondary | ICD-10-CM

## 2018-09-12 DIAGNOSIS — E876 Hypokalemia: Secondary | ICD-10-CM

## 2018-09-12 DIAGNOSIS — M7989 Other specified soft tissue disorders: Secondary | ICD-10-CM

## 2018-09-12 DIAGNOSIS — R519 Headache, unspecified: Secondary | ICD-10-CM

## 2018-09-12 DIAGNOSIS — R51 Headache: Secondary | ICD-10-CM

## 2018-09-12 LAB — URINALYSIS, COMPLETE (UACMP) WITH MICROSCOPIC
Bacteria, UA: NONE SEEN
Bilirubin Urine: NEGATIVE
Glucose, UA: NEGATIVE mg/dL
Ketones, ur: NEGATIVE mg/dL
Nitrite: NEGATIVE
Protein, ur: NEGATIVE mg/dL
Specific Gravity, Urine: 1.014 (ref 1.005–1.030)
pH: 5 (ref 5.0–8.0)

## 2018-09-12 LAB — BASIC METABOLIC PANEL
Anion gap: 7 (ref 5–15)
BUN/Creatinine Ratio: 9 (ref 9–23)
BUN: 11 mg/dL (ref 6–24)
BUN: 13 mg/dL (ref 6–20)
CO2: 21 mmol/L (ref 20–29)
CO2: 25 mmol/L (ref 22–32)
Calcium: 8.5 mg/dL — ABNORMAL LOW (ref 8.9–10.3)
Calcium: 8.8 mg/dL (ref 8.7–10.2)
Chloride: 106 mmol/L (ref 96–106)
Chloride: 106 mmol/L (ref 98–111)
Creatinine, Ser: 1.19 mg/dL — ABNORMAL HIGH (ref 0.57–1.00)
Creatinine, Ser: 1.34 mg/dL — ABNORMAL HIGH (ref 0.44–1.00)
GFR calc Af Amer: 55 mL/min — ABNORMAL LOW (ref >60)
GFR calc Af Amer: 64 mL/min/{1.73_m2} (ref >59)
GFR calc non Af Amer: 47 mL/min — ABNORMAL LOW (ref >60)
GFR calc non Af Amer: 56 mL/min/{1.73_m2} — ABNORMAL LOW (ref >59)
Glucose, Bld: 132 mg/dL — ABNORMAL HIGH (ref 70–99)
Glucose: 130 mg/dL — ABNORMAL HIGH (ref 65–99)
Potassium: 3.5 mmol/L (ref 3.5–5.1)
Potassium: 3.7 mmol/L (ref 3.5–5.2)
Sodium: 138 mmol/L (ref 135–145)
Sodium: 142 mmol/L (ref 134–144)

## 2018-09-12 LAB — CBC
HCT: 32.5 % — ABNORMAL LOW (ref 35.0–47.0)
Hemoglobin: 10.8 g/dL — ABNORMAL LOW (ref 12.0–16.0)
MCH: 27.9 pg (ref 26.0–34.0)
MCHC: 33.1 g/dL (ref 32.0–36.0)
MCV: 84.4 fL (ref 80.0–100.0)
Platelets: 175 10*3/uL (ref 150–440)
RBC: 3.85 MIL/uL (ref 3.80–5.20)
RDW: 14.7 % — ABNORMAL HIGH (ref 11.5–14.5)
WBC: 6 10*3/uL (ref 3.6–11.0)

## 2018-09-12 LAB — HEMOGLOBIN A1C
Est. average glucose Bld gHb Est-mCnc: 134 mg/dL
Hgb A1c MFr Bld: 6.3 % — ABNORMAL HIGH (ref 4.8–5.6)

## 2018-09-12 LAB — TSH: TSH: 2.22 u[IU]/mL (ref 0.450–4.500)

## 2018-09-12 LAB — BRAIN NATRIURETIC PEPTIDE: BNP: 18.6 pg/mL (ref 0.0–100.0)

## 2018-09-12 MED ORDER — PROCHLORPERAZINE EDISYLATE 10 MG/2ML IJ SOLN
10.0000 mg | Freq: Once | INTRAMUSCULAR | Status: DC
  Filled 2018-09-12: qty 2

## 2018-09-12 MED ORDER — DIPHENHYDRAMINE HCL 50 MG/ML IJ SOLN
12.5000 mg | Freq: Once | INTRAMUSCULAR | Status: AC
  Administered 2018-09-12: 12.5 mg via INTRAMUSCULAR

## 2018-09-12 MED ORDER — DIPHENHYDRAMINE HCL 50 MG/ML IJ SOLN
12.5000 mg | Freq: Once | INTRAMUSCULAR | Status: DC
  Filled 2018-09-12: qty 1

## 2018-09-12 MED ORDER — FUROSEMIDE 20 MG PO TABS: 20.0000 mg | ORAL_TABLET | Freq: Every day | ORAL | 1 refills | Status: DC

## 2018-09-12 MED ORDER — POTASSIUM CHLORIDE ER 20 MEQ PO TBCR: EXTENDED_RELEASE_TABLET | ORAL | 2 refills | Status: DC

## 2018-09-12 MED ORDER — ACETAMINOPHEN 500 MG PO TABS
1000.0000 mg | ORAL_TABLET | Freq: Once | ORAL | Status: AC
  Administered 2018-09-12: 1000 mg via ORAL
  Filled 2018-09-12: qty 2

## 2018-09-12 MED ORDER — CHLORTHALIDONE 25 MG PO TABS: 25.0000 mg | ORAL_TABLET | Freq: Every day | ORAL | 0 refills | Status: DC

## 2018-09-12 MED ORDER — PROCHLORPERAZINE EDISYLATE 10 MG/2ML IJ SOLN
10.0000 mg | Freq: Once | INTRAMUSCULAR | Status: AC
  Administered 2018-09-12: 10 mg via INTRAMUSCULAR

## 2018-09-12 NOTE — ED Notes (Signed)
Unable to obtain an IV after multiple sticks by multiple people. Pt consents to give meds IM instead of IV.

## 2018-09-12 NOTE — ED Notes (Addendum)
Pt to the er for headache that began at [place]. Pt took no meds before coming. Pt was seen by her MD today but no  changes to her meds.

## 2018-09-12 NOTE — ED Notes (Signed)
Pt resting in bed NAD, rise and fall of chest noted. Bed locked and low.

## 2018-09-12 NOTE — ED Provider Notes (Signed)
[facility] Emergency Department Provider Note    First MD Initiated Contact with Patient 09/12/18 0024     (approximate)  I have reviewed the triage vital signs and the nursing notes.   HISTORY  Chief Complaint Leg Swelling    HPI Victoria Moore is a 42 y.o. female below listed past medical history presents the ER for evaluation of worsening low leg pain and swelling over the past 2 weeks.  States she is also got a mild headache this consistent with her previous history of headaches and migraines.  She is also been feeling lightheaded and "woozy" particular with standing.  This been ongoing for several days.  Came to the ER because she is felt like her headache was getting worse and was having worsening discomfort in her legs want to be checked out.  Denies any history of DVT.  Not on any blood thinners.  No recent trauma.  No fevers.    Past Medical History:  Diagnosis Date  . Allergy   . Anemia   . Anxiety   . Arthritis   . Asthma   . Blood transfusion without reported diagnosis   . Chronic kidney disease   . Common migraine with intractable migraine 10/07/2016  . Depression   . GERD (gastroesophageal reflux disease)   . Hypertension   . Migraine   . Sarcoidosis   . Seizures ([place])    last one year ago   Family History  Adopted: Yes  Problem Relation Age of Onset  . Other Son        Growing pains  . Asthma Mother   . Heart Problems Mother   . Migraines Mother   . Diabetes Father   . Peptic Ulcer Father   . Heart Problems Father    Past Surgical History:  Procedure Laterality Date  . ABDOMINAL HYSTERECTOMY    . LAPAROSCOPIC OVARIAN CYSTECTOMY Left 03/11/2016   Procedure: LAPAROSCOPIC OVARIAN CYSTECTOMY;  Surgeon: Eldred Manges, MD;  Location: [facility];  Service: Gynecology;  Laterality: Left;  . LAPAROSCOPIC VAGINAL HYSTERECTOMY WITH SALPINGECTOMY Bilateral 03/11/2016   Procedure: LAPAROSCOPIC ASSISTED VAGINAL HYSTERECTOMY WITH  SALPINGECTOMY;  Surgeon: Eldred Manges, MD;  Location: [facility];  Service: Gynecology;  Laterality: Bilateral;  . TUBAL LIGATION     Patient Active Problem List   Diagnosis Date Noted  . Impaired fasting blood sugar 09/11/2018  . Weight gain 09/11/2018  . Edema 09/11/2018  . Frequency of urination 09/11/2018  . Right leg swelling 08/18/2018  . OSA (obstructive sleep apnea) 08/03/2018  . Hypokalemia 06/28/2018  . Medication side effect 06/22/2018  . Acute low back pain without sciatica 06/22/2018  . Personal history of sarcoidosis 05/26/2018  . Vitamin D deficiency 05/26/2018  . Encounter for health maintenance examination with abnormal findings 05/26/2018  . Depression, major, single episode, moderate ([place]) 05/26/2018  . Urinary tract infection without hematuria 05/26/2018  . Daytime sleepiness 05/26/2018  . Chest wall pain 05/26/2018  . GERD (gastroesophageal reflux disease) 05/26/2018  . Essential hypertension, benign 11/15/2016  . 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 Morbid (severe) obesity due to excess calories ([place]) 10/23/2016  . Cough variant asthma vs UACS  10/23/2016  . Common migraine with intractable migraine 10/07/2016  . Dyspnea 04/15/2016  . Hilar adenopathy 04/15/2016  . Fibroids 03/11/2016  . Anemia 11/12/2014  . Symptomatic anemia 11/12/2014  . Menorrhagia 11/12/2014  . Iron deficiency 11/12/2014      Prior to Admission medications   Medication  Sig Start Date End Date Taking? Authorizing Provider  albuterol (PROVENTIL HFA;VENTOLIN HFA) 108 (90 Base) MCG/ACT inhaler Inhale 1-2 puffs into the lungs every 4 (four) hours as needed for wheezing or shortness of breath (cough). 01/06/18   Gildardo Pounds, NP  ALPRAZolam Duanne Moron) 1 MG tablet Take 0.5-1 tablets (0.5-1 mg total) by mouth daily as needed for anxiety. 05/26/18   Libby Maw, MD  amphetamine-dextroamphetamine (ADDERALL XR) 30 MG 24 hr capsule Take 30 mg by mouth daily.    [provider]  carvedilol (COREG CR) 20 MG 24 hr capsule TAKE 1 CAPSULE BY MOUTH EVERY DAY 06/30/18   Libby Maw, MD  chlorthalidone (HYGROTON) 25 MG tablet Take 1 tablet (25 mg total) by mouth daily. 05/26/18   Libby Maw, MD  cyclobenzaprine (FLEXERIL) 10 MG tablet Take 1 tablet (10 mg total) by mouth at bedtime. As needed for back pain. Patient not taking: Reported on 09/11/2018 08/31/18   Libby Maw, MD  ferrous sulfate 325 (65 FE) MG EC tablet Take 1 tablet (325 mg total) by mouth daily with breakfast. 08/31/18   Libby Maw, MD  FLUoxetine (PROZAC) 20 MG tablet Take 1 tablet (20 mg total) by mouth daily. 08/18/18   Libby Maw, MD  gabapentin (NEURONTIN) 100 MG capsule Take 1 capsule (100 mg total) by mouth 3 (three) times daily. 09/01/18 10/01/18  Wieters, Hallie C, PA-C  ibuprofen (ADVIL,MOTRIN) 800 MG tablet Take 1 tablet (800 mg total) by mouth 3 (three) times daily. 07/08/18   Wieters, Hallie C, PA-C  mometasone-formoterol (DULERA) 100-5 MCG/ACT AERO Inhale 2 puffs into the lungs 2 (two) times daily. 11/24/17   Argentina Donovan, PA-C  pantoprazole (PROTONIX) 40 MG tablet Take 1 tablet (40 mg total) by mouth daily. 08/18/18   Libby Maw, MD  Potassium Chloride ER 20 MEQ TBCR Take one daily. 06/28/18   Libby Maw, MD  Vitamin D, Ergocalciferol, (DRISDOL) 50000 units CAPS capsule Take one pill every 3 days. 08/31/18   Libby Maw, MD    Allergies Aspirin    Social History Social History   Tobacco Use  . Smoking status: Former Smoker    Packs/day: 0.25    Years: 17.00    Pack years: 4.25    Types: Cigarettes    Last attempt to quit: 03/02/2013    Years since quitting: 5.5  . Smokeless tobacco: Never Used  Substance Use Topics  . Alcohol use: No    Alcohol/week: 0.0 standard drinks  . Drug use: No    Types: Marijuana    Comment: former - 6 yrs ago    Review of Systems Patient denies headaches,  rhinorrhea, blurry vision, numbness, shortness of breath, chest pain, edema, cough, abdominal pain, nausea, vomiting, diarrhea, dysuria, fevers, rashes or hallucinations unless otherwise stated above in HPI. ____________________________________________   PHYSICAL EXAM:  VITAL SIGNS: Vitals:   09/11/18 2352  BP: 128/75  Pulse: 73  Resp: 18  Temp: 98.8 F (37.1 C)  SpO2: 98%    Constitutional: Alert and oriented.  Eyes: Conjunctivae are normal.  Head: Atraumatic. Nose: No congestion/rhinnorhea. Mouth/Throat: Mucous membranes are moist.   Neck: No stridor. Painless ROM.  Cardiovascular: Normal rate, regular rhythm. Grossly normal heart sounds.  Good peripheral circulation. Respiratory: Normal respiratory effort.  No retractions. Lungs CTAB. Gastrointestinal: Soft and nontender. No distention. No abdominal bruits. No CVA tenderness. Genitourinary:  Musculoskeletal: No lower extremity tenderness ,  L>R LE pitting edema.  No joint effusions. Neurologic:  Normal speech and language. No gross focal neurologic deficits are appreciated. No facial droop Skin:  Skin is warm, dry and intact. No rash noted. Psychiatric: Mood and affect are normal. Speech and behavior are normal.  ____________________________________________   LABS (all labs ordered are listed, but only abnormal results are displayed)  Results for orders placed or performed during the hospital encounter of 09/12/18 (from the past 24 hour(s))  Basic metabolic panel     Status: Abnormal   Collection Time: 09/12/18 12:00 AM  Result Value Ref Range   Sodium 138 135 - 145 mmol/L   Potassium 3.5 3.5 - 5.1 mmol/L   Chloride 106 98 - 111 mmol/L   CO2 25 22 - 32 mmol/L   Glucose, Bld 132 (H) 70 - 99 mg/dL   BUN 13 6 - 20 mg/dL   Creatinine, Ser 1.34 (H) 0.44 - 1.00 mg/dL   Calcium 8.5 (L) 8.9 - 10.3 mg/dL   GFR calc non Af Amer 47 (L) >60 mL/min   GFR calc Af Amer 55 (L) >60 mL/min   Anion gap 7 5 - 15  CBC     Status:  Abnormal   Collection Time: 09/12/18 12:00 AM  Result Value Ref Range   WBC 6.0 3.6 - 11.0 K/uL   RBC 3.85 3.80 - 5.20 MIL/uL   Hemoglobin 10.8 (L) 12.0 - 16.0 g/dL   HCT 32.5 (L) 35.0 - 47.0 %   MCV 84.4 80.0 - 100.0 fL   MCH 27.9 26.0 - 34.0 pg   MCHC 33.1 32.0 - 36.0 g/dL   RDW 14.7 (H) 11.5 - 14.5 %   Platelets 175 150 - 440 K/uL  Urinalysis, Complete w Microscopic     Status: Abnormal   Collection Time: 09/12/18  1:08 AM  Result Value Ref Range   Color, Urine YELLOW (A) YELLOW   APPearance HAZY (A) CLEAR   Specific Gravity, Urine 1.014 1.005 - 1.030   pH 5.0 5.0 - 8.0   Glucose, UA NEGATIVE NEGATIVE mg/dL   Hgb urine dipstick SMALL (A) NEGATIVE   Bilirubin Urine NEGATIVE NEGATIVE   Ketones, ur NEGATIVE NEGATIVE mg/dL   Protein, ur NEGATIVE NEGATIVE mg/dL   Nitrite NEGATIVE NEGATIVE   Leukocytes, UA MODERATE (A) NEGATIVE   RBC / HPF 6-10 0 - 5 RBC/hpf   WBC, UA 11-20 0 - 5 WBC/hpf   Bacteria, UA NONE SEEN NONE SEEN   Squamous Epithelial / LPF 0-5 0 - 5   Mucus PRESENT    ____________________________________________  EKG My review and personal interpretation at Time: 23:53   Indication: leg swelling  Rate: 70  Rhythm: sinus Axis: normal Other: normal intervals, no stemi ____________________________________________  RADIOLOGY  I personally reviewed all radiographic images ordered to evaluate for the above acute complaints and reviewed radiology reports and findings.  These findings were personally discussed with the patient.  Please see medical record for radiology report.  ____________________________________________   PROCEDURES  Procedure(s) performed:  Procedures    Critical Care performed: no ____________________________________________   INITIAL IMPRESSION / ASSESSMENT AND PLAN / ED COURSE  Pertinent labs & imaging results that were available during my care of the patient were reviewed by me and considered in my medical decision making (see chart  for details).   DDX: migraine, tension, cluster, htnive urgency, dvt, edema, lymphedema  Jerlean Peralta is a 42 y.o. who presents to the ED with symptoms as described above.  Patient nontoxic-appearing.  She is afebrile  and hemodynamically stable.  No signs or symptoms of infectious process.  Headache is consistent with previous.  Will give migraine cocktail.  Will order ultrasound to evaluate for DVT.  Does not seem clinically consistent with congestive heart failure.  Lower extremities are well perfused.  Possible lymphedema.  Clinical Course as of Sep 12 342  Tue Sep 12, 2018  0338 Exam shows no evidence of DVT.  No proteinuria.  At this point I do believe patient stable and appropriate for outpatient follow-up.  Headache improved.  Have discussed with the patient and available family all diagnostics and treatments performed thus far and all questions were answered to the best of my ability. The patient demonstrates understanding and agreement with plan.    [PR]    Clinical Course User Index [PR] Merlyn Lot, MD     As part of my medical decision making, I reviewed the following data within the [facility] notes reviewed and incorporated, Labs reviewed, notes from prior ED visits.  ____________________________________________   FINAL CLINICAL IMPRESSION(S) / ED DIAGNOSES  Final diagnoses:  Leg swelling  Acute nonintractable headache, unspecified headache type      NEW MEDICATIONS STARTED DURING THIS VISIT:  New Prescriptions   No medications on file     Note:  This document was prepared using Dragon voice recognition software and may include unintentional dictation errors.    Merlyn Lot, MD 09/12/18 [phone]

## 2018-09-12 NOTE — ED Notes (Signed)
Pt back from ultrasound.

## 2018-09-12 NOTE — ED Notes (Signed)
Pt to ultrasound

## 2018-09-12 NOTE — Discharge Instructions (Signed)

## 2018-09-13 ENCOUNTER — Encounter: Payer: Self-pay | Admitting: Gastroenterology

## 2018-09-13 ENCOUNTER — Ambulatory Visit (INDEPENDENT_AMBULATORY_CARE_PROVIDER_SITE_OTHER): Payer: BLUE CROSS/BLUE SHIELD | Admitting: Gastroenterology

## 2018-09-13 VITALS — BP 144/84 | HR 85 | Ht 69.0 in | Wt 327.0 lb

## 2018-09-13 DIAGNOSIS — R1310 Dysphagia, unspecified: Secondary | ICD-10-CM

## 2018-09-13 DIAGNOSIS — Z6841 Body Mass Index (BMI) 40.0 and over, adult: Secondary | ICD-10-CM

## 2018-09-13 DIAGNOSIS — D509 Iron deficiency anemia, unspecified: Secondary | ICD-10-CM | POA: Diagnosis not present

## 2018-09-13 DIAGNOSIS — K219 Gastro-esophageal reflux disease without esophagitis: Secondary | ICD-10-CM | POA: Diagnosis not present

## 2018-09-13 MED ORDER — DEXLANSOPRAZOLE 60 MG PO CPDR: 60.0000 mg | DELAYED_RELEASE_CAPSULE | Freq: Every day | ORAL | 3 refills | Status: DC

## 2018-09-13 NOTE — Patient Instructions (Signed)
If you are age 44 or older, your body mass index should be between 23-30. Your Body mass index is 48.29 kg/m. If this is out of the aforementioned range listed, please consider follow up with your Primary Care Provider.  If you are age 43 or younger, your body mass index should be between 19-25. Your Body mass index is 48.29 kg/m. If this is out of the aformentioned range listed, please consider follow up with your Primary Care Provider.   You have been scheduled for an endoscopy. Please follow written instructions given to you at your visit today. If you use inhalers (even only as needed), please bring them with you on the day of your procedure. Your physician has requested that you go to www.startemmi.com and enter the access code given to you at your visit today. This web site gives a general overview about your procedure. However, you should still follow specific instructions given to you by our office regarding your preparation for the procedure.  We have sent the following medications to your pharmacy for you to pick up at your convenience: Dexilant 60mg  by mouth once daily.  It was a pleasure to see you today!  Vito Cirigliano, D.O.

## 2018-09-13 NOTE — Progress Notes (Signed)
Chief Complaint: Reflux   Referring Provider:    Self     HPI:     Victoria Moore is a 42 y.o. female referred to the [facility]astro[facility]ology Clinic for evaluation of reflux and dysphagia.   She has been having reflux for approximatley 1 year, but worsenign lately and now w/ dysphagia to solids and liquids. Index sxs HB, regurgitation with infrequent raspy voice and dry cough.  More recently she has had to sleep in a recliner at night due to regurgitation and waterbrash.  She has trialed multiple acid suppressants/antacids.  Zantac previously worked but eventually lost efficacy.  No benefit with OTC Nexium. Started Protonix 40 mg a few months ago, which she has increased to TID without improvement at all. Worse with supine and post prandial. Dysphagia started a few months ago as solids only and more recently difficulty with liquids, pointing to mid chest.  No history of food impaction.  No changes in weight and no fever, chills, night sweats.  No previous EGD.  She has had infrequent episodes of nausea/vomiting, with one episode of blood-tinged emesis recently.  Additionally, she has a history of iron deficiency anemia dating back to 2015 with iron 10 and ferritin 3 at that time.  Hemoglobin as low as 6.8 in 2015, requiring admission with pRBC transfusion. Hysterectomy for menorrhagia and fibroids in March 2017, with overall improvement in baseline hemoglobin and iron indices. Peak of 12.4 in November 2018, and has mildly down trended since that time to 10.8 yesterday despite ongoing oral iron therapy.  Hgb range  10.8 (yesterday) to 12.7 since hysterectomy. MCV improved from 02/2016 (75) to stable mid 80's.  Iron indices improved, but still requiring BID ferrous sulfate.  Aside from single episode of blood-tinged emesis recently, no overt GIB.   Recent labs notable for hemoglobin/hematocrit 10.8/32.5 with MCV/RDW 84.4/14.7 with otherwise normal CBC.  Iron 46, TIBC 360, ferritin 10, iron sat  13%.  Vitamin D 20.07 open (PCM increased ergocalciferol).  Normal B12/folate creatinine 1.34 otherwise normal BMP  Past Medical History:  Diagnosis Date  . Allergy   . Anemia   . Anxiety   . Arthritis   . Asthma   . Blood transfusion without reported diagnosis   . Chronic kidney disease   . Common migraine with intractable migraine 10/07/2016  . Depression   . GERD (gastroesophageal reflux disease)   . Hypertension   . Migraine   . Sarcoidosis   . Seizures ([place])    last one year ago     Past Surgical History:  Procedure Laterality Date  . ABDOMINAL HYSTERECTOMY    . LAPAROSCOPIC OVARIAN CYSTECTOMY Left 03/11/2016   Procedure: LAPAROSCOPIC OVARIAN CYSTECTOMY;  Surgeon: Eldred Manges, MD;  Location: [facility];  Service: Gynecology;  Laterality: Left;  . LAPAROSCOPIC VAGINAL HYSTERECTOMY WITH SALPINGECTOMY Bilateral 03/11/2016   Procedure: LAPAROSCOPIC ASSISTED VAGINAL HYSTERECTOMY WITH SALPINGECTOMY;  Surgeon: Eldred Manges, MD;  Location: [facility];  Service: Gynecology;  Laterality: Bilateral;  . TUBAL LIGATION     Family History  Adopted: Yes  Problem Relation Age of Onset  . Other Son        Growing pains  . Asthma Mother   . Heart Problems Mother   . Migraines Mother   . Diabetes Father   . Peptic Ulcer Father   . Heart Problems Father    Social History   Tobacco Use  . Smoking status: Former  Smoker    Packs/day: 0.25    Years: 17.00    Pack years: 4.25    Types: Cigarettes    Last attempt to quit: 03/02/2013    Years since quitting: 5.5  . Smokeless tobacco: Never Used  Substance Use Topics  . Alcohol use: No    Alcohol/week: 0.0 standard drinks  . Drug use: No    Types: Marijuana    Comment: former - 6 yrs ago   Current Outpatient Medications  Medication Sig Dispense Refill  . albuterol (PROVENTIL HFA;VENTOLIN HFA) 108 (90 Base) MCG/ACT inhaler Inhale 1-2 puffs into the lungs every 4 (four) hours as needed for wheezing or shortness of breath (cough).  1 Inhaler 1  . ALPRAZolam (XANAX) 1 MG tablet Take 0.5-1 tablets (0.5-1 mg total) by mouth daily as needed for anxiety. 30 tablet 0  . amphetamine-dextroamphetamine (ADDERALL XR) 30 MG 24 hr capsule Take 30 mg by mouth as needed.     . carvedilol (COREG CR) 20 MG 24 hr capsule TAKE 1 CAPSULE BY MOUTH EVERY DAY 90 capsule 0  . chlorthalidone (HYGROTON) 25 MG tablet Take 1 tablet (25 mg total) by mouth daily. 90 tablet 0  . cyclobenzaprine (FLEXERIL) 10 MG tablet Take 1 tablet (10 mg total) by mouth at bedtime. As needed for back pain. 30 tablet 0  . ferrous sulfate 325 (65 FE) MG EC tablet Take 1 tablet (325 mg total) by mouth daily with breakfast. 90 tablet 1  . FLUoxetine (PROZAC) 20 MG tablet Take 1 tablet (20 mg total) by mouth daily. 90 tablet 0  . furosemide (LASIX) 20 MG tablet Take 1 tablet (20 mg total) by mouth daily. 30 tablet 1  . gabapentin (NEURONTIN) 100 MG capsule Take 1 capsule (100 mg total) by mouth 3 (three) times daily. 90 capsule 0  . ibuprofen (ADVIL,MOTRIN) 800 MG tablet Take 1 tablet (800 mg total) by mouth 3 (three) times daily. 21 tablet 0  . mometasone-formoterol (DULERA) 100-5 MCG/ACT AERO Inhale 2 puffs into the lungs 2 (two) times daily. 1 Inhaler 5  . pantoprazole (PROTONIX) 40 MG tablet Take 1 tablet (40 mg total) by mouth daily. 30 tablet 3  . Potassium Chloride ER 20 MEQ TBCR Take one daily. 30 tablet 2  . Vitamin D, Ergocalciferol, (DRISDOL) 50000 units CAPS capsule Take one pill every 3 days. 8 capsule 3   No current facility-administered medications for this visit.    Allergies  Allergen Reactions  . Aspirin Anaphylaxis and Hives          Review of Systems: All systems reviewed and negative except where noted in HPI.     Physical Exam:    Wt Readings from Last 3 Encounters:  09/13/18 (!) 327 lb (148.3 kg)  09/11/18 (!) 320 lb (145.2 kg)  09/11/18 (!) 327 lb 6.4 oz (148.5 kg)    BP (!) 144/84   Pulse 85   Ht 5\' 9"  (1.753 m)   Wt (!) 327 lb  (148.3 kg)   LMP 02/19/2016   BMI 48.29 kg/m  Constitutional:  Pleasant, in no acute distress. Psychiatric: Normal mood and affect. Behavior is normal. EENT: Pupils normal.  Conjunctivae are normal. No scleral icterus. Neck supple. No cervical LAD. Cardiovascular: Normal rate, regular rhythm. No edema Pulmonary/chest: Effort normal and breath sounds normal. No wheezing, rales or rhonchi. Abdominal: Soft, nondistended, nontender. Bowel sounds active throughout. There are no masses palpable. No hepatomegaly. Neurological: Alert and oriented to person place and time. Skin: Skin  is warm and dry. No rashes noted.   ASSESSMENT AND PLAN;   Victoria Moore is a 42 y.o. female presenting with:  1) GERD: History of typical reflux symptoms no longer responsive to H2 RA and to date no response to 2 different PPI agents.  Will evaluate and treat as below:  - Stop Protonix and change to Dexilant.  Provided with Dexilant sample today. -Resume antireflux lifestyle measures - We will plan on EGD to evaluate for erosive esophagitis, LES laxity, hiatal hernia, etc. - RTC in 3 months to review medication efficacy - Discussed further evaluation with pH/impedance pending endoscopic evaluation response to therapy  2) dysphagia: - EGD with possible dilation -Advised patient to cut food into small pieces, eat small bites, chew food thoroughly and with plenty of liquids to avoid food impaction.  3) iron deficiency anemia: Significant IDA in 2015 requiring blood transfusion.  Overall improvement in serum hemoglobin, MCV, iron indices since hysterectomy in March 2017, but continues to require oral iron supplementation.  Will evaluate for occult UGIB at time of EGD along with duodenal biopsies to rule out malabsorption, particularly with history of concomitant vitamin D deficiency -EGD with duodenal biopsies - Resume oral iron therapy - If above is unrevealing, we will plan on colonoscopy to evaluate for  occult GIB  4) vitamin D deficiency: - PCM recently increased ergocalciferol.  We will also evaluate for malabsorptive state at time of EGD with small bowel biopsies as noted above  5) obesity: BMI precludes TIF.  Pending on response to medications as above, and pending endoscopic evaluation, if antireflux procedure needed, may need to consider referral to bariatrics for RYGB  The indications, risks, and benefits of EGD were explained to the patient in detail. Risks include but are not limited to bleeding, perforation, adverse reaction to medications, and cardiopulmonary compromise. Sequelae include but are not limited to the possibility of surgery, hositalization, and mortality. The patient verbalized understanding and wished to proceed. All questions answered, referred to scheduler. Further recommendations pending results of the exam.     Victoria Bullion, DO, FACG  09/13/2018, 9:40 AM   Victoria Moore,*

## 2018-09-14 ENCOUNTER — Ambulatory Visit: Payer: BLUE CROSS/BLUE SHIELD | Admitting: Registered"

## 2018-09-14 ENCOUNTER — Telehealth: Payer: Self-pay | Admitting: Medical

## 2018-09-14 NOTE — Telephone Encounter (Signed)
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

## 2018-09-14 NOTE — Telephone Encounter (Signed)
I believe she sees a back specialist, so I will defer this request to them

## 2018-09-15 ENCOUNTER — Ambulatory Visit: Payer: BLUE CROSS/BLUE SHIELD | Admitting: Medical

## 2018-09-15 VITALS — BP 140/90 | HR 86 | Temp 98.3°F | Resp 16 | Ht 69.0 in | Wt 322.6 lb

## 2018-09-15 DIAGNOSIS — G8929 Other chronic pain: Secondary | ICD-10-CM | POA: Insufficient documentation

## 2018-09-15 DIAGNOSIS — R609 Edema, unspecified: Secondary | ICD-10-CM

## 2018-09-15 DIAGNOSIS — I10 Essential (primary) hypertension: Secondary | ICD-10-CM

## 2018-09-15 DIAGNOSIS — M479 Spondylosis, unspecified: Secondary | ICD-10-CM | POA: Diagnosis not present

## 2018-09-15 DIAGNOSIS — M5440 Lumbago with sciatica, unspecified side: Secondary | ICD-10-CM | POA: Diagnosis not present

## 2018-09-15 DIAGNOSIS — G4733 Obstructive sleep apnea (adult) (pediatric): Secondary | ICD-10-CM

## 2018-09-15 DIAGNOSIS — E876 Hypokalemia: Secondary | ICD-10-CM

## 2018-09-15 DIAGNOSIS — D649 Anemia, unspecified: Secondary | ICD-10-CM

## 2018-09-15 HISTORY — DX: Other chronic pain: G89.29

## 2018-09-15 MED ORDER — PREGABALIN 50 MG PO CAPS: 50.0000 mg | ORAL_CAPSULE | Freq: Three times a day (TID) | ORAL | 0 refills | Status: DC

## 2018-09-15 NOTE — Progress Notes (Signed)
Subjective: Chief Complaint  Patient presents with  . back pain    leg pain, back pain, neck pain, feet pain worse when up moving around   Here for leg and back pain, neck pain, feet pain.  I just saw her as a new patient 09/11/18, 4 days ago.  Went to work, but due to pain left work yesterday.    Has hx/o chornic back pain, radicular pain.   No recent trauma, fall, or new injury.  [facility], was put on Lyrica.   She is not sure about follow up.   Had nerve conduction tests she thinks, but not sure about results.   Of note, [facility] notes reviewed from 06/22/2018 visit and she has prior failed gabapentin 300, 3 times daily, Robaxin, Norco, oxycodone and Tylenol 3.  She is not currently taking Lyrica, ran out, was given samples originally.  She is taking the lasix we prescribed 4 days ago and seeing improvement.  Is elevating legs.  In the mornings feet and lower legs haven't felt tight and painful.    She went to the ED this week and saw gastroenterology.  No other aggravating or relieving factors. No other complaint.   Objective: BP 140/90   Pulse 86   Temp 98.3 F (36.8 C) (Oral)   Resp 16   Ht 5\' 9"  (1.753 m)   Wt (!) 322 lb 9.6 oz (146.3 kg)   LMP 02/19/2016   BMI 47.64 kg/m   BP Readings from Last 3 Encounters:  09/15/18 140/90  09/13/18 (!) 144/84  09/12/18 134/80   Wt Readings from Last 3 Encounters:  09/15/18 (!) 322 lb 9.6 oz (146.3 kg)  09/13/18 (!) 327 lb (148.3 kg)  09/11/18 (!) 320 lb (145.2 kg)   Gen: wd, wn, nad, obese AA female Skin unremarkable Tender paraspinal lumbar region, ROM limited to about 30% of normal due to pain, unable to perform heel and toe walk Hips with relatively normal ROM without pain, no deformity Legs with 1+ nonpitting edema, improved from 2 days ago Pulses WNL   MRI lumbar spine 11/ 2017 reviewed showing:  IMPRESSION: Disc and facet degeneration at L5-S1 causing foraminal  narrowing bilaterally. Possible L5 nerve root impingement bilaterally.  Feet swelling and bother her when moving, but improved after sleeping and upon wakening in the mornings.  She had sleep study June 20, 2018 showing mild sleep apnea, desat down to 80%  She has started Lasix from 4 days ago.   She does note some improvement, less tightness and swelling by morning time.    Assessment: Encounter Diagnoses  Name Primary?  . Spondylosis Yes  . Chronic low back pain with sciatica, sciatica laterality unspecified, unspecified back pain laterality   . Edema, unspecified type   . Essential hypertension, benign   . OSA (obstructive sleep apnea)   . Morbid (severe) obesity due to excess calories ([place])   . Hypokalemia   . Anemia, unspecified type      Plan: Spondylosis, chronic low back pain - gave note for work, but advised she follow up with [facility].  Gave samples of Lyrica 50mg  BID since she ran out.    This was initiated by [facility] in June.  Edema -  Improved.  Plan to recheck in 1 wk for BMET.  HTN - c/t current medication, refer to cardiology as discussed 4 days ago  OSA - reviewed sleep study from 05/2018 showing  mild OSA.   This was ordered by pulmonology  Obesity - contributing to the back pain, needs to work on course of action to address this  I reviewed her gastroenterology visit from yesterday, recommendations were given concerning GERD, obesity, anemia, possible upcoming endoscopy   Addendum: She my chart emailed me today later, and I replied with the following summary and clarification...  Leg swelling I recommend you elevate your legs in the evening, wear compression hose, either over-the-counter compression hose from the pharmacy or compressive socks Continue chlorthalidone fluid pill daily, we will continue the Lasix that I added this week daily for now, and continue potassium daily Plan to recheck next Friday with a lab visit  to check the kidney and electrolytes  Blood pressure We have referred you to cardiology for baseline consult Continue your current medications for blood pressure  Back pain As discussed today, make a follow-up appointment with [facility] Start back on the Lyrica samples twice daily  Follow-up with the gastroenterologist as planned regarding reflux and anemia, recent drop in blood count  Regarding prediabetes, blood sugar, weight: Your recent blood results shows elevated blood sugar, at risk for diabetes. I recommend you make some significant changes in exercise and diet and work on losing some weight as this is part of the reason for your back pain  I recommend you try walking 30 to 45 minutes daily for exercise  I recommend a healthy diet.    Do's:  whole grains such as whole grain pasta, rice, whole grains breads and whole grain cereals.  Use small quantities such as 1/2 cup per serving or 2 slices of bread per serving.   Eat 3-5 fruits daily Eat beans at least once daily Eat almonds in small quantities at least 3 days per week   If they eat meat, I recommend small portions of lean meats such as chicken, fish, and [place]. Eat as much NON corn and NON potato vegetables as they like, particularly raw or steamed Drink several large glasses of water daily  Cautions: Limit red meat Limit corn and potatoes Limit sweets, cake, pie, candy Limit beer and alcohol Avoid fried food, fast food, large portions Avoid sugary drinks such as regular soda and sweet tea    Follow up next Friday for lab visit.  Karmela was seen today for back pain.  Diagnoses and all orders for this visit:  Spondylosis  Chronic low back pain with sciatica, sciatica laterality unspecified, unspecified back pain laterality  Edema, unspecified type -     Basic metabolic panel; Future  Essential hypertension, benign -     Basic metabolic panel; Future  OSA (obstructive sleep apnea)  Morbid  (severe) obesity due to excess calories ([facility])  Hypokalemia  Anemia, unspecified type  Other orders -     pregabalin (LYRICA) 50 MG capsule; Take 1 capsule (50 mg total) by mouth 3 (three) times daily.  note given for work

## 2018-09-15 NOTE — Telephone Encounter (Signed)
Pt came in for appt today.  

## 2018-09-15 NOTE — Patient Instructions (Signed)
Recommendations  Begin back on samples of Lyrica 50mg  twice daily  Follow up with [facility] for chornic back pain  Continue your usual medications, but also continue Lasix fluid pill for another week  Plan to return for nurse visit to check kidney marker and potassium next Friday   Walk for exercise daily, elevated legs in the evening  Consider wearing compression hose daily for swelling  Limit salt intake as this worsens swelling  We are referring you to cardiology for baseline evaluation of your heart and blood pressure

## 2018-09-22 ENCOUNTER — Other Ambulatory Visit: Payer: BLUE CROSS/BLUE SHIELD

## 2018-09-25 ENCOUNTER — Other Ambulatory Visit: Payer: BLUE CROSS/BLUE SHIELD

## 2018-09-25 DIAGNOSIS — I10 Essential (primary) hypertension: Secondary | ICD-10-CM

## 2018-09-25 DIAGNOSIS — R609 Edema, unspecified: Secondary | ICD-10-CM

## 2018-09-26 ENCOUNTER — Ambulatory Visit (AMBULATORY_SURGERY_CENTER): Payer: BLUE CROSS/BLUE SHIELD | Admitting: Gastroenterology

## 2018-09-26 ENCOUNTER — Encounter: Payer: Self-pay | Admitting: Gastroenterology

## 2018-09-26 VITALS — BP 129/72 | HR 82 | Temp 98.4°F | Resp 16 | Ht 69.0 in | Wt 327.0 lb

## 2018-09-26 DIAGNOSIS — D509 Iron deficiency anemia, unspecified: Secondary | ICD-10-CM | POA: Diagnosis not present

## 2018-09-26 DIAGNOSIS — K2970 Gastritis, unspecified, without bleeding: Secondary | ICD-10-CM | POA: Diagnosis not present

## 2018-09-26 DIAGNOSIS — R1310 Dysphagia, unspecified: Secondary | ICD-10-CM | POA: Diagnosis not present

## 2018-09-26 DIAGNOSIS — K219 Gastro-esophageal reflux disease without esophagitis: Secondary | ICD-10-CM

## 2018-09-26 DIAGNOSIS — K2990 Gastroduodenitis, unspecified, without bleeding: Secondary | ICD-10-CM | POA: Diagnosis not present

## 2018-09-26 LAB — BASIC METABOLIC PANEL
BUN/Creatinine Ratio: 15 (ref 9–23)
BUN: 15 mg/dL (ref 6–24)
CO2: 27 mmol/L (ref 20–29)
Calcium: 9.1 mg/dL (ref 8.7–10.2)
Chloride: 100 mmol/L (ref 96–106)
Creatinine, Ser: 0.98 mg/dL (ref 0.57–1.00)
GFR calc Af Amer: 81 mL/min/{1.73_m2} (ref >59)
GFR calc non Af Amer: 70 mL/min/{1.73_m2} (ref >59)
Glucose: 133 mg/dL — ABNORMAL HIGH (ref 65–99)
Potassium: 3.8 mmol/L (ref 3.5–5.2)
Sodium: 144 mmol/L (ref 134–144)

## 2018-09-26 MED ORDER — SODIUM CHLORIDE 0.9 % IV SOLN: 500.0000 mL | Freq: Once | INTRAVENOUS | Status: DC

## 2018-09-26 NOTE — Patient Instructions (Signed)
Discharge instructions given. Biopsies taken. dilatation diet given. Resume previous medications. YOU HAD AN ENDOSCOPIC PROCEDURE TODAY AT [facility]:   Refer to the procedure report that was given to you for any specific questions about what was found during the examination.  If the procedure report does not answer your questions, please call your gastroenterologist to clarify.  If you requested that your care partner not be given the details of your procedure findings, then the procedure report has been included in a sealed envelope for you to review at your convenience later.  YOU SHOULD EXPECT: Some feelings of bloating in the abdomen. Passage of more gas than usual.  Walking can help get rid of the air that was put into your GI tract during the procedure and reduce the bloating. If you had a lower endoscopy (such as a colonoscopy or flexible sigmoidoscopy) you may notice spotting of blood in your stool or on the toilet paper. If you underwent a bowel prep for your procedure, you may not have a normal bowel movement for a few days.  Please Note:  You might notice some irritation and congestion in your nose or some drainage.  This is from the oxygen used during your procedure.  There is no need for concern and it should clear up in a day or so.  SYMPTOMS TO REPORT IMMEDIATELY:    Following upper endoscopy (EGD)  Vomiting of blood or coffee ground material  New chest pain or pain under the shoulder blades  Painful or persistently difficult swallowing  New shortness of breath  Fever of 100F or higher  Black, tarry-looking stools  For urgent or emergent issues, a gastroenterologist can be reached at any hour by calling [phone].   DIET:  We do recommend a small meal at first, but then you may proceed to your regular diet.  Drink plenty of fluids but you should avoid alcoholic beverages for 24 hours.  ACTIVITY:  You should plan to take it easy for the rest of today  and you should NOT DRIVE or use heavy machinery until tomorrow (because of the sedation medicines used during the test).    FOLLOW UP: Our staff will call the number listed on your records the next business day following your procedure to check on you and address any questions or concerns that you may have regarding the information given to you following your procedure. If we do not reach you, we will leave a message.  However, if you are feeling well and you are not experiencing any problems, there is no need to return our call.  We will assume that you have returned to your regular daily activities without incident.  If any biopsies were taken you will be contacted by phone or by letter within the next 1-3 weeks.  Please call us at [phone] if you have not heard about the biopsies in 3 weeks.    SIGNATURES/CONFIDENTIALITY: You and/or your care partner have signed paperwork which will be entered into your electronic medical record.  These signatures attest to the fact that that the information above on your After Visit Summary has been reviewed and is understood.  Full responsibility of the confidentiality of this discharge information lies with you and/or your care-partner.

## 2018-09-26 NOTE — Progress Notes (Signed)
Pt's states no medical or surgical changes since previsit or office visit. 

## 2018-09-26 NOTE — Op Note (Signed)
[facility] Patient Name: Victoria Moore Procedure Date: 09/26/2018 3:44 PM MRN: [id number] Endoscopist: Gerrit Heck , MD Age: 42 Referring MD:  Date of Birth: 1976-03-16 Gender: Female Account #: [id number] Procedure:                Upper GI endoscopy Indications:              Dysphagia, Suspected esophageal reflux Medicines:                Monitored Anesthesia Care Procedure:                Pre-Anesthesia Assessment:                           - Prior to the procedure, a History and Physical                            was performed, and patient medications and                            allergies were reviewed. The patient's tolerance of                            previous anesthesia was also reviewed. The risks                            and benefits of the procedure and the sedation                            options and risks were discussed with the patient.                            All questions were answered, and informed consent                            was obtained. Prior Anticoagulants: The patient has                            taken no previous anticoagulant or antiplatelet                            agents. ASA Grade Assessment: II - A patient with                            mild systemic disease. After reviewing the risks                            and benefits, the patient was deemed in                            satisfactory condition to undergo the procedure.                           After obtaining informed consent, the endoscope was  passed under direct vision. Throughout the                            procedure, the patient's blood pressure, pulse, and                            oxygen saturations were monitored continuously. The                            Endoscope was introduced through the mouth, and                            advanced to the second part of duodenum. The upper                            GI endoscopy was  accomplished without difficulty.                            The patient tolerated the procedure well. Scope In: Scope Out: Findings:                 A non-obstructing Schatzki ring was found in the                            lower third of the esophagus. The scope was                            withdrawn. Dilation was performed with a Maloney                            dilator with no resistance at 62 Fr. The dilation                            site was examined following endoscope reinsertion                            and showed no bleeding, mucosal tear or                            perforation. The remainder of the esophagus was                            similarly normal appearing and without mucosal                            rent. The stricture was then fractured using cold                            forceps. Estimated blood loss was minimal.                           The upper third of the esophagus and middle third  of the esophagus were normal.                           The Z-line was regular and was found 40 cm from the                            incisors.                           The gastroesophageal flap valve was visualized                            endoscopically and classified as Hill Grade II                            (fold present, opens with respiration).                           Scattered mild inflammation characterized by                            erythema was found in the gastric fundus and in the                            gastric body. Biopsies were taken with a cold                            forceps for Helicobacter pylori testing. Estimated                            blood loss was minimal.                           The gastric antrum and pylorus were normal.                           The duodenal bulb, first portion of the duodenum                            and second portion of the duodenum were normal. Complications:             No immediate complications. Estimated Blood Loss:     Estimated blood loss was minimal. Impression:               - Non-obstructing Schatzki ring. Dilated. Biopsied.                           - Normal upper third of esophagus and middle third                            of esophagus.                           - Z-line regular, 40 cm from the incisors.                           -  Gastroesophageal flap valve classified as Hill                            Grade II (fold present, opens with respiration).                           - Gastritis. Biopsied.                           - Normal antrum and pylorus.                           - Normal duodenal bulb, first portion of the                            duodenum and second portion of the duodenum. Recommendation:           - Patient has a contact number available for                            emergencies. The signs and symptoms of potential                            delayed complications were discussed with the                            patient. Return to normal activities tomorrow.                            Written discharge instructions were provided to the                            patient.                           - Soft diet today.                           - Continue present medications.                           - Await pathology results.                           - Return to GI clinic PRN. Gerrit Heck, MD 09/26/2018 4:15:47 PM

## 2018-09-26 NOTE — Progress Notes (Signed)
Called to room to assist during endoscopic procedure.  Patient ID and intended procedure confirmed with present staff. Received instructions for my participation in the procedure from the performing physician.  

## 2018-09-26 NOTE — Progress Notes (Signed)
To PACU, VSS. Report to RN.tb 

## 2018-09-27 ENCOUNTER — Telehealth: Payer: Self-pay

## 2018-09-27 DIAGNOSIS — K219 Gastro-esophageal reflux disease without esophagitis: Secondary | ICD-10-CM

## 2018-09-27 NOTE — Telephone Encounter (Signed)
No answer, left message to call if having any problems, B.Khalil Belote RN  

## 2018-09-27 NOTE — Telephone Encounter (Signed)
Attempted to reach pt. With follow-up call following endoscopic procedure 09/26/2018.  LM on pt. Voice mail.  Will try to reach pt. Again later today.

## 2018-09-27 NOTE — Telephone Encounter (Signed)
Pt returned call and stated that she is not feeling well, she cannot swallow as her throat hurts and she has nausea. Pls call her.

## 2018-09-28 ENCOUNTER — Telehealth: Payer: Self-pay | Admitting: Medical

## 2018-09-28 MED ORDER — ONDANSETRON HCL 4 MG PO TABS: ORAL_TABLET | ORAL | 0 refills | Status: DC

## 2018-09-28 MED ORDER — SUCRALFATE 1 GM/10ML PO SUSP: ORAL | 0 refills | Status: DC

## 2018-09-28 NOTE — Telephone Encounter (Signed)
Left detailed message on machine re: instructions to stay on soft food until able to tolerate diet.  Also, instructed regarding follow up, medications being sent to the pharmacy and follow up.  Told to call back if any further questions or needs.

## 2018-09-28 NOTE — Telephone Encounter (Signed)
I have faxed notes and referral to [facility] and waiting on them to call her to make appointment.

## 2018-09-28 NOTE — Addendum Note (Signed)
Addended by: Randall Hiss B on: 09/28/2018 03:59 PM   Modules accepted: Orders

## 2018-09-28 NOTE — Telephone Encounter (Signed)
What is status on cardiology consult?  She hasn't heard from cardiology about appt time?

## 2018-09-28 NOTE — Telephone Encounter (Signed)
Phoned patient this morning. Patient states she still has a sore throat, is having difficulty swallowing, only eating soft foods. Reports nausea and that her stomach is in knots.  Please advise.  Thank you, Victoria Moore

## 2018-09-28 NOTE — Addendum Note (Signed)
Addended by: Randall Hiss B on: 09/28/2018 04:05 PM   Modules accepted: Orders

## 2018-09-29 ENCOUNTER — Telehealth: Payer: Self-pay | Admitting: Gastroenterology

## 2018-09-29 NOTE — Telephone Encounter (Signed)
Can also get her back in for f/u as she had several concerns.   We need to make sure we have received notes from [facility] on her.

## 2018-10-02 ENCOUNTER — Institutional Professional Consult (permissible substitution): Payer: BLUE CROSS/BLUE SHIELD | Admitting: Pulmonary Disease

## 2018-10-02 NOTE — Telephone Encounter (Signed)
Patient is coming in on 10-04-18.

## 2018-10-02 NOTE — Telephone Encounter (Signed)
Pt calling again regarding this message 

## 2018-10-03 ENCOUNTER — Encounter: Payer: Self-pay | Admitting: Gastroenterology

## 2018-10-04 ENCOUNTER — Institutional Professional Consult (permissible substitution): Payer: BLUE CROSS/BLUE SHIELD | Admitting: Pulmonary Disease

## 2018-10-04 ENCOUNTER — Telehealth: Payer: Self-pay

## 2018-10-04 ENCOUNTER — Ambulatory Visit: Payer: BLUE CROSS/BLUE SHIELD | Admitting: Medical

## 2018-10-04 VITALS — BP 130/80 | HR 78 | Temp 97.7°F | Resp 16 | Ht 69.0 in | Wt 320.8 lb

## 2018-10-04 DIAGNOSIS — R0989 Other specified symptoms and signs involving the circulatory and respiratory systems: Secondary | ICD-10-CM | POA: Diagnosis not present

## 2018-10-04 DIAGNOSIS — R609 Edema, unspecified: Secondary | ICD-10-CM

## 2018-10-04 DIAGNOSIS — G4730 Sleep apnea, unspecified: Secondary | ICD-10-CM | POA: Diagnosis not present

## 2018-10-04 DIAGNOSIS — R7301 Impaired fasting glucose: Secondary | ICD-10-CM

## 2018-10-04 DIAGNOSIS — I10 Essential (primary) hypertension: Secondary | ICD-10-CM | POA: Diagnosis not present

## 2018-10-04 DIAGNOSIS — D649 Anemia, unspecified: Secondary | ICD-10-CM

## 2018-10-04 HISTORY — DX: Other specified symptoms and signs involving the circulatory and respiratory systems: R09.89

## 2018-10-04 LAB — POCT CBG (FASTING - GLUCOSE)-MANUAL ENTRY: Glucose Fasting, POC: 102 mg/dL — AB (ref 70–99)

## 2018-10-04 MED ORDER — SPIRONOLACTONE 25 MG PO TABS: 25.0000 mg | ORAL_TABLET | Freq: Every day | ORAL | 2 refills | Status: DC

## 2018-10-04 NOTE — Progress Notes (Signed)
Subjective: Chief Complaint  Patient presents with  . follow up    follow up   Here for recheck on swelling.  She emailed the other day about this.  I just started seeing her as a new patient on 09/11/2018.  Here mainly for recheck on swelling.  She notes despite current medication, staying swollen in both legs.  No calve pain.  But gets achy in both legs.    No current SOB or DOE.  Using OTC compression hose.  No prior ABIs.  She reports that her left great toe is discolored and feels cold.   She has gotten call back from cardiology, still pending appt.   We referred last visit.  She notes having sleep study back in June but never heard about results  Saw GI recently for anemia and consult   Past Medical History:  Diagnosis Date  . Allergy   . Anemia   . Anxiety   . Arthritis   . Asthma   . Blood transfusion without reported diagnosis   . Chronic kidney disease   . Common migraine with intractable migraine 10/07/2016  . Depression   . GERD (gastroesophageal reflux disease)   . Hypertension   . Migraine   . Sarcoidosis   . Seizures ([place])    last one year ago   Current Outpatient Medications on File Prior to Visit  Medication Sig Dispense Refill  . albuterol (PROVENTIL HFA;VENTOLIN HFA) 108 (90 Base) MCG/ACT inhaler Inhale 1-2 puffs into the lungs every 4 (four) hours as needed for wheezing or shortness of breath (cough). 1 Inhaler 1  . carvedilol (COREG CR) 20 MG 24 hr capsule TAKE 1 CAPSULE BY MOUTH EVERY DAY 90 capsule 0  . chlorthalidone (HYGROTON) 25 MG tablet Take 1 tablet (25 mg total) by mouth daily. 90 tablet 0  . cyclobenzaprine (FLEXERIL) 10 MG tablet Take 1 tablet (10 mg total) by mouth at bedtime. As needed for back pain. 30 tablet 0  . dexlansoprazole (DEXILANT) 60 MG capsule Take 1 capsule (60 mg total) by mouth daily. 90 capsule 3  . ferrous sulfate 325 (65 FE) MG EC tablet Take 1 tablet (325 mg total) by mouth daily with breakfast. 90 tablet 1  . FLUoxetine  (PROZAC) 20 MG tablet Take 1 tablet (20 mg total) by mouth daily. 90 tablet 0  . mometasone-formoterol (DULERA) 100-5 MCG/ACT AERO Inhale 2 puffs into the lungs 2 (two) times daily. 1 Inhaler 5  . Potassium Chloride ER 20 MEQ TBCR Take one daily. 30 tablet 2  . pregabalin (LYRICA) 50 MG capsule Take 1 capsule (50 mg total) by mouth 3 (three) times daily. 20 capsule 0  . sucralfate (CARAFATE) 1 GM/10ML suspension Takes 10 mL every 6 hours as needed for sore throat or abdominal discomfort 420 mL 0  . Vitamin D, Ergocalciferol, (DRISDOL) 50000 units CAPS capsule Take one pill every 3 days. 8 capsule 3  . ALPRAZolam (XANAX) 1 MG tablet Take 0.5-1 tablets (0.5-1 mg total) by mouth daily as needed for anxiety. (Patient not taking: Reported on 09/26/2018) 30 tablet 0  . amphetamine-dextroamphetamine (ADDERALL XR) 30 MG 24 hr capsule Take 30 mg by mouth as needed.     . gabapentin (NEURONTIN) 100 MG capsule Take 1 capsule (100 mg total) by mouth 3 (three) times daily. 90 capsule 0  . ondansetron (ZOFRAN) 4 MG tablet Take every 6 hours as needed for nausea 10 tablet 0  . pantoprazole (PROTONIX) 40 MG tablet Take 1 tablet (40  mg total) by mouth daily. (Patient not taking: Reported on 09/26/2018) 30 tablet 3   No current facility-administered medications on file prior to visit.    ROS as in subjective   Objective: BP 130/80   Pulse 78   Temp 97.7 F (36.5 C) (Oral)   Resp 16   Ht 5\' 9"  (1.753 m)   Wt (!) 320 lb 12.8 oz (145.5 kg)   LMP 02/19/2016   SpO2 98%   BMI 47.37 kg/m   Wt Readings from Last 3 Encounters:  10/04/18 (!) 320 lb 12.8 oz (145.5 kg)  09/26/18 (!) 327 lb (148.3 kg)  09/15/18 (!) 322 lb 9.6 oz (146.3 kg)   BP Readings from Last 3 Encounters:  10/04/18 130/80  09/26/18 129/72  09/15/18 140/90   Gen: wd, wn, nad, obese AA female 1+ barely palpable pedal pulses, cap refill decreased in left great toe with darkened coloration left great toenail Otherwise pulses WNL Left  great toe slightly cooler than other toes, subtly Bilat LE 2+ nonpitting edema Lungs clear Heart rrr , normal s1, s2, no murmurs     Assessment: Encounter Diagnoses  Name Primary?  . Edema, unspecified type Yes  . Prolonged capillary refill time   . Decreased pedal pulses   . Mild sleep apnea   . Essential hypertension, benign   . Impaired fasting blood sugar   . Anemia, unspecified type      Reviewed 09/12/18 leg venous US showing no DVT bilaterally.  Reviewed 05/20/18 CXR showing no active cardiopulmonary disease     Plan: Edema-continue chlorthalidone.  She has been on this prior to me meeting her recently.  I recently added Lasix but it has not made a significant change in her edema.  Let us stop that and change to spironolactone.  Advise she wear compression hose daily, limit salt, exercise  Decreased pedal pulses, prolonged capillary refill time the left great toe with discoloration-we will refer for ABIs  Hypertension-continue current medicine other than stopping Lasix and switching to spironolactone.  She has been referred to cardiology and has an appointment with them soon for baseline evaluation given long-standing high blood pressure  Impaired glucose- fingerstick glucose 102 today fasting.  Counseled on diet and exercise and follow-up to monitor this  Mild sleep apnea Home sleep study June 2019.  Discussed the need for weight loss, possibly considering CPAP machine.  I will defer this to cardiology since it was mild and blood pressures are currently controlled  Anemia-just had consult with GI.  Advised to resume iron therapy, had recent EGD with duodenal biopsies.  There was consideration for colonoscopy.   Brantleigh was seen today for follow up.  Diagnoses and all orders for this visit:  Edema, unspecified type -     VAS [place] ABI WITH/WO TBI; Future  Prolonged capillary refill time -     VAS [place] ABI WITH/WO TBI; Future  Decreased pedal pulses -     VAS [place]  ABI WITH/WO TBI; Future  Mild sleep apnea  Essential hypertension, benign  Impaired fasting blood sugar -     Glucose (CBG), Fasting  Anemia, unspecified type  Other orders -     spironolactone (ALDACTONE) 25 MG tablet; Take 1 tablet (25 mg total) by mouth daily.

## 2018-10-04 NOTE — Telephone Encounter (Signed)
Patient has appointment with [facility] at 10-12-18

## 2018-10-10 NOTE — Telephone Encounter (Signed)
Sent co-ay card to patients pharmacy. Notified patient.

## 2018-10-12 ENCOUNTER — Ambulatory Visit (HOSPITAL_COMMUNITY)
Admission: RE | Admit: 2018-10-12 | Discharge: 2018-10-12 | Disposition: A | Discharge status: Home / Self Care | Payer: BLUE CROSS/BLUE SHIELD | Source: Ambulatory Visit | Attending: Medical | Admitting: Medical

## 2018-10-12 DIAGNOSIS — R0989 Other specified symptoms and signs involving the circulatory and respiratory systems: Secondary | ICD-10-CM | POA: Diagnosis present

## 2018-10-12 DIAGNOSIS — R609 Edema, unspecified: Secondary | ICD-10-CM | POA: Diagnosis present

## 2018-10-12 NOTE — Progress Notes (Signed)
VASCULAR LAB PRELIMINARY  ARTERIAL  ABI completed: Summary: Right: Resting right ankle-brachial index is within normal range. No evidence of significant right lower extremity arterial disease. The right toe-brachial index is abnormal.   Left: Resting left ankle-brachial index is within normal range. No evidence of significant left lower extremity arterial disease. The left toe-brachial index is abnormal.     RIGHT    LEFT    PRESSURE WAVEFORM  PRESSURE WAVEFORM  BRACHIAL 135 Triphasic BRACHIAL 133 Triphasic  DP 145 Triphasic DP 152 Biphasic  PER 149 Triphasic PER 131 Biphasic  GREAT TOE 90  GREAT TOE 91     RIGHT LEFT  ABI/TBI 1.10/0.67 1.13/0.67     Victoria Moore  [name], RVT 10/12/2018, 3:52 PM

## 2018-10-13 ENCOUNTER — Other Ambulatory Visit: Payer: Self-pay

## 2018-10-13 DIAGNOSIS — R0989 Other specified symptoms and signs involving the circulatory and respiratory systems: Secondary | ICD-10-CM

## 2018-10-13 DIAGNOSIS — R609 Edema, unspecified: Secondary | ICD-10-CM

## 2018-10-16 ENCOUNTER — Other Ambulatory Visit: Payer: Self-pay

## 2018-10-16 DIAGNOSIS — R609 Edema, unspecified: Secondary | ICD-10-CM

## 2018-10-27 ENCOUNTER — Other Ambulatory Visit: Payer: Self-pay | Admitting: Medical

## 2018-10-27 DIAGNOSIS — M545 Low back pain, unspecified: Secondary | ICD-10-CM

## 2018-10-27 MED ORDER — CYCLOBENZAPRINE HCL 10 MG PO TABS: 10.0000 mg | ORAL_TABLET | Freq: Every day | ORAL | 0 refills | Status: DC

## 2018-11-01 ENCOUNTER — Other Ambulatory Visit: Payer: Self-pay | Admitting: Family Medicine

## 2018-11-01 ENCOUNTER — Encounter: Payer: Self-pay | Admitting: Family Medicine

## 2018-11-01 ENCOUNTER — Other Ambulatory Visit: Payer: Self-pay | Admitting: Medical

## 2018-11-01 DIAGNOSIS — F321 Major depressive disorder, single episode, moderate: Secondary | ICD-10-CM

## 2018-11-01 MED ORDER — FLUOXETINE HCL 20 MG PO TABS: 20.0000 mg | ORAL_TABLET | Freq: Every day | ORAL | 0 refills | Status: DC

## 2018-11-01 MED ORDER — ALPRAZOLAM 1 MG PO TABS: 0.5000 mg | ORAL_TABLET | Freq: Every day | ORAL | 0 refills | Status: DC | PRN

## 2018-11-02 ENCOUNTER — Ambulatory Visit (INDEPENDENT_AMBULATORY_CARE_PROVIDER_SITE_OTHER): Payer: BLUE CROSS/BLUE SHIELD | Admitting: Pulmonary Disease

## 2018-11-02 ENCOUNTER — Encounter: Payer: Self-pay | Admitting: Pulmonary Disease

## 2018-11-02 VITALS — BP 118/78 | HR 78 | Ht 69.0 in | Wt 316.0 lb

## 2018-11-02 DIAGNOSIS — G4733 Obstructive sleep apnea (adult) (pediatric): Secondary | ICD-10-CM

## 2018-11-02 DIAGNOSIS — R0683 Snoring: Secondary | ICD-10-CM

## 2018-11-02 MED ORDER — MOMETASONE FURO-FORMOTEROL FUM 100-5 MCG/ACT IN AERO: 2.0000 | INHALATION_SPRAY | Freq: Two times a day (BID) | RESPIRATORY_TRACT | 0 refills | Status: DC

## 2018-11-02 NOTE — Patient Instructions (Signed)
Mild obstructive sleep apnea on recent home sleep study  Severe excessive daytime sleepiness Inadequate sleep secondary to sleep onset insomnia  We will initiate CPAP treatment Auto titrating CPAP-5-15 of pressure  You may take melatonin 3 to 5 mg about 2 hours prior to desired bedtime If this does not help and you on CPAP already, a mild hypnotic will be initiated  Encourage regular exercise 20-30 minutes daily, continue your weight loss efforts  We will see you back in the office in about 6 to 8 weeks Call with any significant concerns

## 2018-11-02 NOTE — Progress Notes (Signed)
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 albuterol (PROVENTIL HFA;VENTOLIN HFA) 108 (90 Base) MCG/ACT inhaler Inhale 1-2 puffs into the lungs every 4 (four) hours as needed for wheezing or shortness of breath (cough).  . ALPRAZolam (XANAX) 1 MG tablet Take 0.5-1 tablets (0.5-1 mg total) by mouth daily as needed for anxiety.  Marland Kitchen amphetamine-dextroamphetamine (ADDERALL XR) 30 MG 24 hr capsule Take 30 mg by mouth as needed.   . carvedilol (COREG CR) 20 MG 24 hr capsule TAKE 1 CAPSULE BY MOUTH EVERY DAY  . chlorthalidone (HYGROTON) 25 MG tablet Take 1 tablet (25 mg total) by mouth daily.  . cyclobenzaprine (FLEXERIL) 10 MG tablet Take 1 tablet (10 mg total) by mouth at bedtime. As needed for back pain.  Marland Kitchen dexlansoprazole (DEXILANT) 60 MG capsule Take 1 capsule (60 mg total) by mouth daily.  . ferrous sulfate 325 (65 FE) MG EC tablet Take 1 tablet (325  mg total) by mouth daily with breakfast.  . FLUoxetine (PROZAC) 20 MG tablet Take 1 tablet (20 mg total) by mouth daily.  . mometasone-formoterol (DULERA) 100-5 MCG/ACT AERO Inhale 2 puffs into the lungs 2 (two) times daily.  . Potassium Chloride ER 20 MEQ TBCR Take one daily.  . pregabalin (LYRICA) 50 MG capsule Take 1 capsule (50 mg total) by mouth 3 (three) times daily.  Marland Kitchen spironolactone (ALDACTONE) 25 MG tablet Take 1 tablet (25 mg total) by mouth daily.  . sucralfate (CARAFATE) 1 GM/10ML suspension Takes 10 mL every 6 hours as needed for sore throat or abdominal discomfort  . Vitamin D, Ergocalciferol, (DRISDOL) 50000 units CAPS capsule Take one pill every 3 days.  . [DISCONTINUED] ondansetron (ZOFRAN) 4 MG tablet Take every 6 hours as needed for nausea  . [DISCONTINUED] pantoprazole (PROTONIX) 40 MG tablet Take 1 tablet (40 mg total) by mouth daily.  Marland Kitchen gabapentin (NEURONTIN) 100 MG capsule Take 1 capsule (100 mg total) by mouth 3 (three) times daily.   No facility-administered encounter medications on file as of 11/02/2018.     Allergies as of 11/02/2018 - Review Complete 11/02/2018  Allergen Reaction Noted  . Aspirin Anaphylaxis and Hives 11/15/2012    Past Medical History:  Diagnosis Date  . Allergy   . Anemia   . Anxiety   . Arthritis   . Asthma   . Blood transfusion without reported diagnosis   . Chronic kidney disease   . Common  migraine with intractable migraine 10/07/2016  . Depression   . GERD (gastroesophageal reflux disease)   . Hypertension   . Migraine   . Sarcoidosis   . Seizures ([place])    last one year ago    Past Surgical History:  Procedure Laterality Date  . ABDOMINAL HYSTERECTOMY    . LAPAROSCOPIC OVARIAN CYSTECTOMY Left 03/11/2016   Procedure: LAPAROSCOPIC OVARIAN CYSTECTOMY;  Surgeon: Eldred Manges, MD;  Location: [facility];  Service: Gynecology;  Laterality: Left;  . LAPAROSCOPIC VAGINAL HYSTERECTOMY WITH SALPINGECTOMY Bilateral 03/11/2016    Procedure: LAPAROSCOPIC ASSISTED VAGINAL HYSTERECTOMY WITH SALPINGECTOMY;  Surgeon: Eldred Manges, MD;  Location: [facility];  Service: Gynecology;  Laterality: Bilateral;  . TUBAL LIGATION      Family History  Adopted: Yes  Problem Relation Age of Onset  . Other Son        Growing pains  . Asthma Mother   . Heart Problems Mother   . Migraines Mother   . Diabetes Father   . Peptic Ulcer Father   . Heart Problems Father     Social History   Socioeconomic History  . Marital status: Significant Other    Spouse name: Not on file  . Number of children: 1  . Years of education: 80  . Highest education level: Not on file  Occupational History  . Occupation: [employer]  . Financial resource strain: Not on file  . Food insecurity:    Worry: Not on file    Inability: Not on file  . Transportation needs:    Medical: Not on file    Non-medical: Not on file  Tobacco Use  . Smoking status: Former Smoker    Packs/day: 0.25    Years: 17.00    Pack years: 4.25    Types: Cigarettes    Last attempt to quit: 03/02/2013    Years since quitting: 5.6  . Smokeless tobacco: Never Used  Substance and Sexual Activity  . Alcohol use: No    Alcohol/week: 0.0 standard drinks  . Drug use: No    Types: Marijuana    Comment: former - 6 yrs ago  . Sexual activity: Not on file  Lifestyle  . Physical activity:    Days per week: Not on file    Minutes per session: Not on file  . Stress: Not on file  Relationships  . Social connections:    Talks on phone: Not on file    Gets together: Not on file    Attends religious service: Not on file    Active member of club or organization: Not on file    Attends meetings of clubs or organizations: Not on file    Relationship status: Not on file  . Intimate partner violence:    Fear of current or ex partner: Not on file    Emotionally abused: Not on file    Physically abused: Not on file    Forced sexual activity: Not on file  Other  Topics Concern  . Not on file  Social History Narrative   She lives w/ her fiance'. She has one son.    Highest level of education:  12th grade, did not graduate. Currently back in school.   Right-handed   Caffeine: 1 cup of coffee some mornings    Review of Systems  Constitutional: Negative.   HENT: Negative.   Respiratory: Positive for apnea.   Cardiovascular: Negative.   Gastrointestinal: Negative.   Musculoskeletal: Negative.  Skin: Negative.   Psychiatric/Behavioral: Positive for sleep disturbance.    Vitals:   11/02/18 0909  BP: 118/78  Pulse: 78  SpO2: 97%     Physical Exam  Constitutional: She is oriented to person, place, and time. She appears well-developed and well-nourished.  obese  HENT:  Head: Normocephalic and atraumatic.  Crowded oropharynx, Mallampati 4  Eyes: Conjunctivae are normal. Right eye exhibits no discharge. Left eye exhibits no discharge.  Neck: Normal range of motion. Neck supple. No tracheal deviation present. No thyromegaly present.  Cardiovascular: Normal rate.  Pulmonary/Chest: Effort normal and breath sounds normal. No respiratory distress. She has no wheezes.  Abdominal: Soft. She exhibits no distension. There is no tenderness.  Musculoskeletal: Normal range of motion. She exhibits no edema or deformity.  Neurological: She is alert and oriented to person, place, and time. She has normal reflexes.  Skin: Skin is warm and dry. No erythema.  Psychiatric: She has a normal mood and affect.   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, no environmental factors contributing to this  Insufficient sleep -Only managing to sleep about 4 to 5 hours every night-this will contribute to daytime fatigue and tiredness Mild obstructive sleep  apnea History of asthma Obesity   Plan/Recommendations:  We will contact DME company to initiate auto titrating CPAP at 5-15 You may take melatonin at 3 to 5 mg, about 2 hours prior to desired bedtime Encourage weight loss and regular exercises-20 to 30 minutes a day Encourage lateral position sleeping and possible elevation of the head of the bed to about 30 degrees Continue treatment for asthma Continue treatment for GERD  I will see you back in the office in about 6 to 8 weeks We will try and get a download from the machine at that time to assess for adequacy of treatment of the obstructive sleep apnea   Sherrilyn Rist MD [facility] 11/02/2018, 9:11 AM  CC: Libby Maw,*

## 2018-11-06 ENCOUNTER — Ambulatory Visit: Payer: BLUE CROSS/BLUE SHIELD | Admitting: Medical

## 2018-11-08 ENCOUNTER — Encounter: Payer: Self-pay | Admitting: Medical

## 2018-11-15 ENCOUNTER — Emergency Department
Admission: EM | Admit: 2018-11-15 | Discharge: 2018-11-15 | Disposition: A | Discharge status: Home / Self Care | Payer: BLUE CROSS/BLUE SHIELD | Attending: Emergency Medicine | Admitting: Emergency Medicine

## 2018-11-15 ENCOUNTER — Other Ambulatory Visit: Payer: Self-pay

## 2018-11-15 ENCOUNTER — Encounter: Payer: Self-pay | Admitting: Emergency Medicine

## 2018-11-15 DIAGNOSIS — J45909 Unspecified asthma, uncomplicated: Secondary | ICD-10-CM | POA: Diagnosis not present

## 2018-11-15 DIAGNOSIS — Z79899 Other long term (current) drug therapy: Secondary | ICD-10-CM | POA: Diagnosis not present

## 2018-11-15 DIAGNOSIS — I129 Hypertensive chronic kidney disease with stage 1 through stage 4 chronic kidney disease, or unspecified chronic kidney disease: Secondary | ICD-10-CM | POA: Diagnosis not present

## 2018-11-15 DIAGNOSIS — N3000 Acute cystitis without hematuria: Secondary | ICD-10-CM | POA: Diagnosis not present

## 2018-11-15 DIAGNOSIS — N189 Chronic kidney disease, unspecified: Secondary | ICD-10-CM | POA: Insufficient documentation

## 2018-11-15 DIAGNOSIS — Z87891 Personal history of nicotine dependence: Secondary | ICD-10-CM | POA: Insufficient documentation

## 2018-11-15 DIAGNOSIS — R300 Dysuria: Secondary | ICD-10-CM | POA: Diagnosis present

## 2018-11-15 LAB — URINALYSIS, COMPLETE (UACMP) WITH MICROSCOPIC
Bilirubin Urine: NEGATIVE
Glucose, UA: NEGATIVE mg/dL
Hgb urine dipstick: NEGATIVE
Ketones, ur: NEGATIVE mg/dL
Nitrite: NEGATIVE
Protein, ur: NEGATIVE mg/dL
Specific Gravity, Urine: 1.019 (ref 1.005–1.030)
WBC, UA: >50 WBC/hpf — ABNORMAL HIGH (ref 0–5)
pH: 5 (ref 5.0–8.0)

## 2018-11-15 MED ORDER — CEPHALEXIN 500 MG PO CAPS: 500.0000 mg | ORAL_CAPSULE | Freq: Three times a day (TID) | ORAL | 0 refills | Status: DC

## 2018-11-15 MED ORDER — ONDANSETRON 4 MG PO TBDP: 4.0000 mg | ORAL_TABLET | Freq: Three times a day (TID) | ORAL | 0 refills | Status: DC | PRN

## 2018-11-15 NOTE — Discharge Instructions (Signed)
Follow-up with your primary care provider if any continued problems.  Increase fluids.  Begin taking antibiotics as directed for the next 10 days.  Also Zofran as needed for nausea vomiting every 8 hours.  You may take Tylenol if needed for pain.  Return to the emergency department if any severe worsening of your symptoms such as increased nausea or vomiting, fever or chills, or inability to take antibiotics.

## 2018-11-15 NOTE — ED Notes (Signed)
See triage note  Presents with urinary freq and dysuria   States sx's started about 3 days ago  Afebrile on arrival

## 2018-11-15 NOTE — ED Triage Notes (Signed)
C/O pain with urination x3 days.

## 2018-11-15 NOTE — ED Provider Notes (Signed)
[facility] Emergency Department Provider Note  ____________________________________________   First MD Initiated Contact with Patient 11/15/18 [phone]     (approximate)  I have reviewed the triage vital signs and the nursing notes.   HISTORY  Chief Complaint Dysuria   HPI Timea Breed is a 42 y.o. female presents to the ED with complaint of frequency and dysuria.  Patient states that she has had UTIs in the past with the last one being almost a year ago.  She denies any vaginal complaints, fever, chills,  or vomiting.  There is been no gross hematuria.  Patient denies back pain.  Past Medical History:  Diagnosis Date  . Allergy   . Anemia   . Anxiety   . Arthritis   . Asthma   . Blood transfusion without reported diagnosis   . Chronic kidney disease   . Common migraine with intractable migraine 10/07/2016  . Depression   . GERD (gastroesophageal reflux disease)   . Hypertension   . Migraine   . Sarcoidosis   . Seizures ([place])    last one year ago    Patient Active Problem List   Diagnosis Date Noted  . Prolonged capillary refill time 10/04/2018  . Decreased pedal pulses 10/04/2018  . Spondylosis 09/15/2018  . Chronic low back pain with sciatica 09/15/2018  . Impaired fasting blood sugar 09/11/2018  . Weight gain 09/11/2018  . Edema 09/11/2018  . Frequency of urination 09/11/2018  . Right leg swelling 08/18/2018  . Mild sleep apnea 08/03/2018  . Hypokalemia 06/28/2018  . Medication side effect 06/22/2018  . Acute low back pain without sciatica 06/22/2018  . Personal history of sarcoidosis 05/26/2018  . Vitamin D deficiency 05/26/2018  . Encounter for health maintenance examination with abnormal findings 05/26/2018  . Depression, major, single episode, moderate ([place]) 05/26/2018  . Urinary tract infection without hematuria 05/26/2018  . Daytime sleepiness 05/26/2018  . Chest wall pain 05/26/2018  . GERD (gastroesophageal reflux  disease) 05/26/2018  . Essential hypertension, benign 11/15/2016  . 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 Morbid (severe) obesity due to excess calories ([place]) 10/23/2016  . Cough variant asthma vs UACS  10/23/2016  . Common migraine with intractable migraine 10/07/2016  . Dyspnea 04/15/2016  . Hilar adenopathy 04/15/2016  . Fibroids 03/11/2016  . Anemia 11/12/2014  . Symptomatic anemia 11/12/2014  . Menorrhagia 11/12/2014  . Iron deficiency 11/12/2014    Past Surgical History:  Procedure Laterality Date  . ABDOMINAL HYSTERECTOMY    . LAPAROSCOPIC OVARIAN CYSTECTOMY Left 03/11/2016   Procedure: LAPAROSCOPIC OVARIAN CYSTECTOMY;  Surgeon: Eldred Manges, MD;  Location: [facility];  Service: Gynecology;  Laterality: Left;  . LAPAROSCOPIC VAGINAL HYSTERECTOMY WITH SALPINGECTOMY Bilateral 03/11/2016   Procedure: LAPAROSCOPIC ASSISTED VAGINAL HYSTERECTOMY WITH SALPINGECTOMY;  Surgeon: Eldred Manges, MD;  Location: [facility];  Service: Gynecology;  Laterality: Bilateral;  . TUBAL LIGATION      Prior to Admission medications   Medication Sig Start Date End Date Taking? Authorizing Provider  albuterol (PROVENTIL HFA;VENTOLIN HFA) 108 (90 Base) MCG/ACT inhaler Inhale 1-2 puffs into the lungs every 4 (four) hours as needed for wheezing or shortness of breath (cough). 01/06/18   Gildardo Pounds, NP  ALPRAZolam Duanne Moron) 1 MG tablet Take 0.5-1 tablets (0.5-1 mg total) by mouth daily as needed for anxiety. 11/01/18   Tysinger, Camelia Eng, PA-C  amphetamine-dextroamphetamine (ADDERALL XR) 30 MG 24 hr capsule Take 30 mg by mouth as needed.     [provider]  carvedilol (COREG CR) 20 MG 24 hr capsule TAKE 1 CAPSULE BY MOUTH EVERY DAY 06/30/18   Libby Maw, MD  cephALEXin (KEFLEX) 500 MG capsule Take 1 capsule (500 mg total) by mouth 3 (three) times daily. 11/15/18   Johnn Hai, PA-C  chlorthalidone (HYGROTON) 25 MG tablet Take 1 tablet (25 mg total) by mouth daily. 09/12/18    Tysinger, Camelia Eng, PA-C  cyclobenzaprine (FLEXERIL) 10 MG tablet Take 1 tablet (10 mg total) by mouth at bedtime. As needed for back pain. 10/27/18   Tysinger, Camelia Eng, PA-C  dexlansoprazole (DEXILANT) 60 MG capsule Take 1 capsule (60 mg total) by mouth daily. 09/13/18   Cirigliano, Vito V, DO  ferrous sulfate 325 (65 FE) MG EC tablet Take 1 tablet (325 mg total) by mouth daily with breakfast. 08/31/18   Libby Maw, MD  FLUoxetine (PROZAC) 20 MG tablet Take 1 tablet (20 mg total) by mouth daily. 11/01/18   Tysinger, Camelia Eng, PA-C  gabapentin (NEURONTIN) 100 MG capsule Take 1 capsule (100 mg total) by mouth 3 (three) times daily. 09/01/18 10/01/18  Wieters, Hallie C, PA-C  mometasone-formoterol (DULERA) 100-5 MCG/ACT AERO Inhale 2 puffs into the lungs 2 (two) times daily. 11/24/17   Argentina Donovan, PA-C  mometasone-formoterol (DULERA) 100-5 MCG/ACT AERO Inhale 2 puffs into the lungs 2 (two) times daily. 11/02/18   Olalere, Cicero Duck A, MD  ondansetron (ZOFRAN ODT) 4 MG disintegrating tablet Take 1 tablet (4 mg total) by mouth every 8 (eight) hours as needed for nausea or vomiting. 11/15/18   Johnn Hai, PA-C  Potassium Chloride ER 20 MEQ TBCR Take one daily. 09/12/18   Tysinger, Camelia Eng, PA-C  pregabalin (LYRICA) 50 MG capsule Take 1 capsule (50 mg total) by mouth 3 (three) times daily. 09/15/18   Tysinger, Camelia Eng, PA-C  spironolactone (ALDACTONE) 25 MG tablet Take 1 tablet (25 mg total) by mouth daily. 10/04/18   Tysinger, Camelia Eng, PA-C  sucralfate (CARAFATE) 1 GM/10ML suspension Takes 10 mL every 6 hours as needed for sore throat or abdominal discomfort 09/28/18   Cirigliano, Vito V, DO  Vitamin D, Ergocalciferol, (DRISDOL) 50000 units CAPS capsule Take one pill every 3 days. 08/31/18   Libby Maw, MD    Allergies Aspirin  Family History  Adopted: Yes  Problem Relation Age of Onset  . Other Son        Growing pains  . Asthma Mother   . Heart Problems Mother   . Migraines  Mother   . Diabetes Father   . Peptic Ulcer Father   . Heart Problems Father     Social History Social History   Tobacco Use  . Smoking status: Former Smoker    Packs/day: 0.25    Years: 17.00    Pack years: 4.25    Types: Cigarettes    Last attempt to quit: 03/02/2013    Years since quitting: 5.7  . Smokeless tobacco: Never Used  Substance Use Topics  . Alcohol use: No    Alcohol/week: 0.0 standard drinks  . Drug use: No    Types: Marijuana    Comment: former - 6 yrs ago    Review of Systems Constitutional: No fever/chills Cardiovascular: Denies chest pain. Respiratory: Denies shortness of breath. Gastrointestinal: No abdominal pain.  Positive nausea, no vomiting.   Genitourinary: Positive for dysuria.  Negative for vaginal discharge. Musculoskeletal: Negative for muscle aches. Skin: Negative for rash. Neurological: Negative for headaches, focal weakness  or numbness. ___________________________________________   PHYSICAL EXAM:  VITAL SIGNS: ED Triage Vitals  Enc Vitals Group     BP 11/15/18 0914 (!) 151/106     Pulse Rate 11/15/18 0914 83     Resp 11/15/18 0914 16     Temp 11/15/18 0914 98.5 F (36.9 C)     Temp Source 11/15/18 0914 Oral     SpO2 11/15/18 0914 100 %     Weight 11/15/18 0910 (!) 316 lb (143.3 kg)     Height --      Head Circumference --      Peak Flow --      Pain Score 11/15/18 0910 0     Pain Loc --      Pain Edu? --      Excl. in [place]? --    Constitutional: Alert and oriented. Well appearing and in no acute distress. Eyes: Conjunctivae are normal.  Head: Atraumatic. Neck: No stridor.   Cardiovascular: Normal rate, regular rhythm. Grossly normal heart sounds.  Good peripheral circulation. Respiratory: Normal respiratory effort.  No retractions. Lungs CTAB. Gastrointestinal: Soft and nontender. No distention.  No CVA tenderness. Musculoskeletal: Moves upper and lower extremities without any difficulty.  Normal gait was  noted. Neurologic:  Normal speech and language. No gross focal neurologic deficits are appreciated. No gait instability. Skin:  Skin is warm, dry and intact. No rash noted. Psychiatric: Mood and affect are normal. Speech and behavior are normal.  ____________________________________________   LABS (all labs ordered are listed, but only abnormal results are displayed)  Labs Reviewed  URINALYSIS, COMPLETE (UACMP) WITH MICROSCOPIC - Abnormal; Notable for the following components:      Result Value   Color, Urine YELLOW (*)    APPearance CLOUDY (*)    Leukocytes, UA LARGE (*)    WBC, UA >50 (*)    Bacteria, UA MANY (*)    All other components within normal limits  URINE CULTURE    PROCEDURES  Procedure(s) performed: None  Procedures  Critical Care performed: No  ____________________________________________   INITIAL IMPRESSION / ASSESSMENT AND PLAN / ED COURSE  As part of my medical decision making, I reviewed the following data within the electronic MEDICAL RECORD NUMBER Notes from prior ED visits and [place] Controlled Substance Database  Patient presents to the ED with complaint of frequency and dysuria.  She states that she has had a history of UTIs in the past and this feels very similar.  She denies any back pain or symptoms suggestive of a pyelonephritis.  Urinalysis is consistent with an acute cystitis.  Patient was given a prescription for Keflex 500 mg 3 times daily for 10 days and Zofran if needed for nausea.  She is encouraged to increase fluids and follow-up with her PCP if any continued problems.  Also a urine culture was ordered today.  ____________________________________________   FINAL CLINICAL IMPRESSION(S) / ED DIAGNOSES  Final diagnoses:  Acute cystitis without hematuria     ED Discharge Orders         Ordered    cephALEXin (KEFLEX) 500 MG capsule  3 times daily     11/15/18 1017    ondansetron (ZOFRAN ODT) 4 MG disintegrating tablet  Every 8 hours PRN      11/15/18 1017           Note:  This document was prepared using Dragon voice recognition software and may include unintentional dictation errors.    Johnn Hai, PA-C 11/15/18 1131  Nena Polio, MD 11/15/18 [phone]

## 2018-11-17 LAB — URINE CULTURE
Culture: >=100000 — AB
Special Requests: NORMAL

## 2018-11-27 ENCOUNTER — Encounter: Payer: Self-pay | Admitting: Medical

## 2018-11-27 ENCOUNTER — Ambulatory Visit: Payer: BLUE CROSS/BLUE SHIELD | Admitting: Medical

## 2018-11-27 VITALS — BP 120/70 | HR 74 | Temp 99.0°F | Resp 16 | Ht 69.0 in | Wt 325.4 lb

## 2018-11-27 DIAGNOSIS — R6882 Decreased libido: Secondary | ICD-10-CM

## 2018-11-27 DIAGNOSIS — G4730 Sleep apnea, unspecified: Secondary | ICD-10-CM

## 2018-11-27 DIAGNOSIS — N390 Urinary tract infection, site not specified: Secondary | ICD-10-CM

## 2018-11-27 DIAGNOSIS — E611 Iron deficiency: Secondary | ICD-10-CM

## 2018-11-27 DIAGNOSIS — R7301 Impaired fasting glucose: Secondary | ICD-10-CM

## 2018-11-27 DIAGNOSIS — D649 Anemia, unspecified: Secondary | ICD-10-CM

## 2018-11-27 DIAGNOSIS — R350 Frequency of micturition: Secondary | ICD-10-CM | POA: Diagnosis not present

## 2018-11-27 DIAGNOSIS — E559 Vitamin D deficiency, unspecified: Secondary | ICD-10-CM

## 2018-11-27 DIAGNOSIS — Z862 Personal history of diseases of the blood and blood-forming organs and certain disorders involving the immune mechanism: Secondary | ICD-10-CM

## 2018-11-27 HISTORY — DX: Decreased libido: R68.82

## 2018-11-27 LAB — POCT URINALYSIS DIP (PROADVANTAGE DEVICE)
Bilirubin, UA: NEGATIVE
Blood, UA: NEGATIVE
Glucose, UA: NEGATIVE mg/dL
Ketones, POC UA: NEGATIVE mg/dL
Nitrite, UA: NEGATIVE
Protein Ur, POC: NEGATIVE mg/dL
Specific Gravity, Urine: 1.015
Urobilinogen, Ur: NEGATIVE
pH, UA: 8 (ref 5.0–8.0)

## 2018-11-27 MED ORDER — NITROFURANTOIN MONOHYD MACRO 100 MG PO CAPS: 100.0000 mg | ORAL_CAPSULE | Freq: Two times a day (BID) | ORAL | 0 refills | Status: DC

## 2018-11-27 NOTE — Assessment & Plan Note (Signed)
She completed a 27-month course of weekly prescription vitamin D 50,000 units/week.  Recheck labs today

## 2018-11-27 NOTE — Assessment & Plan Note (Signed)
She is awaiting CPAP.  Sees pulmonology for sleep apnea

## 2018-11-27 NOTE — Assessment & Plan Note (Signed)
Because of and contributing factors.  We will check some labs today.  We discussed disease processes such as high blood pressure and diabetes and other diseases that can contribute to sexual dysfunction.  We discussed possible treatments that could possibly include hormone therapy

## 2018-11-27 NOTE — Progress Notes (Signed)
Subjective: Chief Complaint  Patient presents with  . follow up    discuss medication for sex drive. follow up UA   Here for concerns.  Medical team: Dr. Melvyn Novas, pulmonology Dr. Sherrilyn Rist, pulmonary/sleep medicine Dr. Carlena Sax, GI  Not currently seeing counseling, but has appt for 12/2018 for counseling scheduled.    First concern is decreased sex drive.   Feels like she has other things in her health under better control, so wants focus on improving this.   Not currently taking Prozac or Lyrica.  Married, been married 5 years.   Both she and husband work full time.  Has had problems with sex drive for years.  Relationship with husband is good, no fighting, no issues.  Does get some vaginal dryness.    Urge for sex is not there.  Doesn't feel depressed in general.  Does have lot of issues with anxiety, but says things have improved with anxiety since starting back to the gym an hour most days since last visit here.  No prior treatment for libido.   Has had libido problems far longer than she has been on current BP medication.   Stopped Prozac recently.  Still uses Xanax prn.  No pain with intercourse  S/p hysterectomy 2017, still has ovaries.  Adopted.  Saw emergency dept recently for urinary tract infection.  Finished the antibiotic but doesn't feel like the infection is all gone.   Did a urine sample today.  Has OSA ,is awaiting her CPAP.   Past Medical History:  Diagnosis Date  . Allergy   . Anemia   . Anxiety   . Arthritis   . Asthma   . Blood transfusion without reported diagnosis   . Chronic kidney disease   . Common migraine with intractable migraine 10/07/2016  . Depression   . GERD (gastroesophageal reflux disease)   . Hypertension   . Migraine   . Sarcoidosis    personal history of  . Seizures ([place])    history of    Past Surgical History:  Procedure Laterality Date  . ABDOMINAL HYSTERECTOMY    . LAPAROSCOPIC OVARIAN CYSTECTOMY Left 03/11/2016    Procedure: LAPAROSCOPIC OVARIAN CYSTECTOMY;  Surgeon: Eldred Manges, MD;  Location: [facility];  Service: Gynecology;  Laterality: Left;  . LAPAROSCOPIC VAGINAL HYSTERECTOMY WITH SALPINGECTOMY Bilateral 03/11/2016   Procedure: LAPAROSCOPIC ASSISTED VAGINAL HYSTERECTOMY WITH SALPINGECTOMY;  Surgeon: Eldred Manges, MD;  Location: [facility];  Service: Gynecology;  Laterality: Bilateral;  . TUBAL LIGATION       ROS as in subjective   Objective: BP 120/70   Pulse 74   Temp 99 F (37.2 C) (Oral)   Resp 16   Ht '5\' 9"'$  (1.753 m)   Wt (!) 325 lb 6.4 oz (147.6 kg)   LMP 02/19/2016   SpO2 99%   BMI 48.05 kg/m   Wt Readings from Last 3 Encounters:  11/27/18 (!) 325 lb 6.4 oz (147.6 kg)  11/15/18 (!) 316 lb (143.3 kg)  11/02/18 (!) 316 lb (143.3 kg)    Gen: wd, wn, nad Psych: pleasant, good eye contact, answers questions appropriately    Assessment: Encounter Diagnoses  Name Primary?  . Low libido Yes  . Frequency of urination   . Urinary tract infection without hematuria, site unspecified   . Mild sleep apnea   . Vitamin D deficiency   . Personal history of sarcoidosis   . Iron deficiency   . Impaired fasting blood sugar   . Anemia,  unspecified type   . Morbid (severe) obesity due to excess calories ([place])      Plan: I discussed with her that there was correspondence between 2 different primary care offices in the chart record recently.  I advised that she needs to pick one primary care office and stick with that so as to not confuse her or [place] family medical providers.  She should not have more than one primary care office so things do not get lost in the shuffle.  She voices understanding of this  With that said, we discussed her concerns below:  Anemia Labs in September still showing iron deficiency.   Check up on labs today.  She has been compliant with iron.   Consider GI f/u and colonoscopy  Low libido Because of and contributing factors.  We will check some labs  today.  We discussed disease processes such as high blood pressure and diabetes and other diseases that can contribute to sexual dysfunction.  We discussed possible treatments that could possibly include hormone therapy  Urinary tract infection without hematuria She is still having residual symptoms from recent UTI.  Urinalysis abnormal today.  We will change to Macrobid based on culture results from recent labs  Vitamin D deficiency She completed a 43-monthcourse of weekly prescription vitamin D 50,000 units/week.  Recheck labs today  Mild sleep apnea She is awaiting CPAP.  Sees pulmonology for sleep apnea  Impaired fasting blood sugar Counseled on diet recommendations, exercise, repeat be met lab today.  We will plan to check hemoglobin A1c next month as it has been less than 3 months since last check.  She was prediabetic last visit  Morbid (severe) obesity due to excess calories ([place] Counseled on weight, gave recommendations to begin vegetarian diet.  Gave example meal plan    [place] seen today for follow up.  Diagnoses and all orders for this visit:  Low libido -     Testosterone -     Estrogens, Total -     FSH/LH -     Basic metabolic panel -     CBC with Differential/Platelet -     VITAMIN D 25 Hydroxy (Vit-D Deficiency, Fractures)  Frequency of urination -     POCT Urinalysis DIP (Proadvantage Device)  Urinary tract infection without hematuria, site unspecified  Mild sleep apnea  Vitamin D deficiency -     VITAMIN D 25 Hydroxy (Vit-D Deficiency, Fractures)  Personal history of sarcoidosis  Iron deficiency -     Iron and TIBC -     CBC with Differential/Platelet  Impaired fasting blood sugar -     Basic metabolic panel  Anemia, unspecified type  Morbid (severe) obesity due to excess calories ([facility])  Other orders -     nitrofurantoin, macrocrystal-monohydrate, (MACROBID) 100 MG capsule; Take 1 capsule (100 mg total) by mouth 2 (two) times  daily.

## 2018-11-27 NOTE — Assessment & Plan Note (Signed)
Counseled on weight, gave recommendations to begin vegetarian diet.  Gave example meal plan

## 2018-11-27 NOTE — Assessment & Plan Note (Signed)
Labs in September still showing iron deficiency.   Check up on labs today.  She has been compliant with iron.   Consider GI f/u and colonoscopy

## 2018-11-27 NOTE — Assessment & Plan Note (Signed)
Counseled on diet recommendations, exercise, repeat be met lab today.  We will plan to check hemoglobin A1c next month as it has been less than 3 months since last check.  She was prediabetic last visit

## 2018-11-27 NOTE — Patient Instructions (Signed)
Lets change strategies and try something that may work better than what you are currently doing  I want you to eat 3 meals a day +2 snacks, one midmorning snack and one mid afternoon snack  Breakfast You may eat 1 of the following  Smoothie with Almond milk, handful of kale or spinach, and 1-2 fruit servings of your choice such as berries or 1/2 banana  Whole grain slice of toast and thin layer of low sugar jam or small amount of honey  Whole grain slice of toast and avocado spread  1/2 cup of steel cut oats (oatmeal)   Mid-morning snack 1 fruit serving such as one of the following:  medium-sized apple  medium-sized orange,  Tangerine  1/2 banana   3/4 cup of fresh berries or frozen berries  A protein source such as one of the following:  8 almonds   small handful of walnuts or other nuts  Hummus and vegetable such as carrots   Lunch A protein source such as 1 of the following: . 1 serving of beans such as black beans, pinto beans, green beans, or edamame (soy beans) . Veggie burger  . Non breaded fish such as salmon or tuna, either baked, grilled, or broiled Vegetable - Half of your plate should be a non-starchy vegetables!  So avoid white potatoes and corn.  Otherwise, eat a large portion of vegetables. . Avocado, cucumber, tomato, carrots, greens, lettuce, squash, okra, etc.  . Vegetables can include salad with olive oil/vinaigrette dressing Grains such as 1/2 cup of brown rice, quinoa, barley or other whole grain or 1 or 2 slices of whole grain bread   Mid-afternoon snack 1 fruit serving such as one of the following:  medium-sized apple  medium-sized orange,  Tangerine  1/2 banana   3/4 cup of fresh berries or frozen berries  A protein source such as one of the following:  8 almonds   small handful of walnuts or other nuts  Hummus and vegetable such as carrots   Dinner A protein source such as 1 of the following: . 1 serving of beans such as  black beans, pinto beans, green beans, or edamame (soy beans) . Veggie burger  . Non breaded fish such as salmon or tuna, either baked, grilled, or broiled Vegetable - Half of your plate should be a non-starchy vegetables!  So avoid white potatoes and corn.  Otherwise, eat a large portion of vegetables. . Avocado, cucumber, tomato, carrots, greens, lettuce, squash, okra, etc.  . Vegetables can include salad with olive oil/vinaigrette dressing Grains such as 1/2 cup of brown rice, quinoa, barley or other whole grain or 1 or 2 slices of whole grain bread   Beverages: Water Unsweet tea Home made juice with a juicer without sugar added other than small bit of honey or agave nectar Water with sugar free flavor such as [place]   AVOID.... For the time being I want you to cut out the following items completely: . Soda, sweet tea, juice, beer or wine or alcohol . Sweets such as cake, candy, pies, chips, cookies, chocolate

## 2018-11-27 NOTE — Assessment & Plan Note (Signed)
She is still having residual symptoms from recent UTI.  Urinalysis abnormal today.  We will change to Macrobid based on culture results from recent labs

## 2018-12-01 ENCOUNTER — Ambulatory Visit (INDEPENDENT_AMBULATORY_CARE_PROVIDER_SITE_OTHER): Payer: BLUE CROSS/BLUE SHIELD | Admitting: Adult Health

## 2018-12-01 ENCOUNTER — Encounter: Payer: Self-pay | Admitting: Adult Health

## 2018-12-01 VITALS — BP 122/80 | HR 68 | Temp 98.4°F | Ht 69.0 in | Wt 319.0 lb

## 2018-12-01 DIAGNOSIS — G4733 Obstructive sleep apnea (adult) (pediatric): Secondary | ICD-10-CM

## 2018-12-01 DIAGNOSIS — J45991 Cough variant asthma: Secondary | ICD-10-CM

## 2018-12-01 DIAGNOSIS — R05 Cough: Secondary | ICD-10-CM | POA: Diagnosis not present

## 2018-12-01 DIAGNOSIS — R059 Cough, unspecified: Secondary | ICD-10-CM

## 2018-12-01 MED ORDER — BENZONATATE 200 MG PO CAPS: 200.0000 mg | ORAL_CAPSULE | Freq: Three times a day (TID) | ORAL | 1 refills | Status: DC | PRN

## 2018-12-01 MED ORDER — LEVALBUTEROL HCL 0.63 MG/3ML IN NEBU
0.6300 mg | INHALATION_SOLUTION | Freq: Once | RESPIRATORY_TRACT | Status: AC
  Administered 2018-12-01: 0.63 mg via RESPIRATORY_TRACT

## 2018-12-01 NOTE — Patient Instructions (Addendum)
Begin Zyrtec 10mg  At bedtime for 1 week then As needed   Mucinex DM Twice daily  As needed  Cough /congestion  Tessalon Three times a day  As needed  Cough  Fluids and rest .  Continue on Dulera 2 puffs Twice daily  , rinse after use.  Saline nasal rinses As needed   Follow up with Dr. Melvyn Novas  In 3 months and As needed   Please contact office for sooner follow up if symptoms do not improve or worsen or seek emergency care

## 2018-12-01 NOTE — Assessment & Plan Note (Signed)
Mild flare with URI suspect viral in nature with normal VS .  xopenex neb x 1   Plan  Patient Instructions  Begin Zyrtec 10mg  At bedtime for 1 week then As needed   Mucinex DM Twice daily  As needed  Cough /congestion  Tessalon Three times a day  As needed  Cough  Fluids and rest .  Continue on Dulera 2 puffs Twice daily  , rinse after use.  Saline nasal rinses As needed   Follow up with Dr. Melvyn Novas  In 3 months and As needed   Please contact office for sooner follow up if symptoms do not improve or worsen or seek emergency care

## 2018-12-01 NOTE — Progress Notes (Signed)
@Patient  ID: Victoria Moore, female    DOB: 1976-10-15, 42 y.o.   MRN: [id number]  Chief Complaint  Patient presents with  . Acute Visit    Cough     Referring provider: Carlena Hurl, PA-C  HPI: 42 yo female former smoker followed for possible cough variant asthma and possible Sarcoid (CT Chest w/ hilar /subcarinal adenopathy) -no active symptoms and OSA (dx 05/2018 )  PMH : HTN   TEST/EVENTS  See CT 11/12/14 R hilar/ subcarinal : These are not significant by size criteria (size not reported)  - f/u cxr 02/03/2016 no vz adenopathy and nl on 10/22/2016  - ACE 10/22/2016 = 22  - PFTs 10/25/16 FVC 3.28 (85%) s obstruction and dlco 70 corrects to 96% -HST 05/2018 >AHI 8.1/hr   12/01/2018 Acute OV : Cough  Patient presents for an acute office visit. Complains of 3 days of dry cough, low grade fever (100.2) , severe coughing paroxsyms, and wheezing .   No known sick contacts. No recent travel.  Not used any otc for treatment . No discolored mucus or sinus pain/pressure.  Appetite is good with no n/v.d.   Recently dx with UTI , on Macrobid (currently on day 4/7) . Not on long term.    Recently diagnosed with mild OSA , started on CPAP . Awaiting CPAP approval.  Has daytime sleepiness.    Allergies  Allergen Reactions  . Aspirin Anaphylaxis and Hives         Immunization History  Administered Date(s) Administered  . PPD Test 04/20/2017  . Pneumococcal Polysaccharide-23 11/13/2014  . Td 10/18/2015  . Tdap 05/08/2014, 01/15/2016    Past Medical History:  Diagnosis Date  . Allergy   . Anemia   . Anxiety   . Arthritis   . Asthma   . Blood transfusion without reported diagnosis   . Chronic kidney disease   . Common migraine with intractable migraine 10/07/2016  . Depression   . GERD (gastroesophageal reflux disease)   . Hypertension   . Migraine   . Sarcoidosis    personal history of  . Seizures ([place])    history of     Tobacco History: Social History    Tobacco Use  Smoking Status Former Smoker  . Packs/day: 0.25  . Years: 17.00  . Pack years: 4.25  . Types: Cigarettes  . Last attempt to quit: 03/02/2013  . Years since quitting: 5.7  Smokeless Tobacco Never Used   Counseling given: Not Answered   Outpatient Medications Prior to Visit  Medication Sig Dispense Refill  . albuterol (PROVENTIL HFA;VENTOLIN HFA) 108 (90 Base) MCG/ACT inhaler Inhale 1-2 puffs into the lungs every 4 (four) hours as needed for wheezing or shortness of breath (cough). 1 Inhaler 1  . ALPRAZolam (XANAX) 1 MG tablet Take 0.5-1 tablets (0.5-1 mg total) by mouth daily as needed for anxiety. 30 tablet 0  . carvedilol (COREG CR) 20 MG 24 hr capsule TAKE 1 CAPSULE BY MOUTH EVERY DAY 90 capsule 0  . chlorthalidone (HYGROTON) 25 MG tablet Take 1 tablet (25 mg total) by mouth daily. 90 tablet 0  . cyclobenzaprine (FLEXERIL) 10 MG tablet Take 1 tablet (10 mg total) by mouth at bedtime. As needed for back pain. 30 tablet 0  . dexlansoprazole (DEXILANT) 60 MG capsule Take 1 capsule (60 mg total) by mouth daily. 90 capsule 3  . ferrous sulfate 325 (65 FE) MG EC tablet Take 1 tablet (325 mg total) by mouth daily with breakfast.  90 tablet 1  . mometasone-formoterol (DULERA) 100-5 MCG/ACT AERO Inhale 2 puffs into the lungs 2 (two) times daily. 1 Inhaler 5  . nitrofurantoin, macrocrystal-monohydrate, (MACROBID) 100 MG capsule Take 1 capsule (100 mg total) by mouth 2 (two) times daily. 14 capsule 0  . Potassium Chloride ER 20 MEQ TBCR Take one daily. 30 tablet 2  . spironolactone (ALDACTONE) 25 MG tablet Take 1 tablet (25 mg total) by mouth daily. 30 tablet 2  . amphetamine-dextroamphetamine (ADDERALL XR) 30 MG 24 hr capsule Take 30 mg by mouth as needed.     Marland Kitchen FLUoxetine (PROZAC) 20 MG tablet Take 1 tablet (20 mg total) by mouth daily. (Patient not taking: Reported on 12/01/2018) 90 tablet 0  . pregabalin (LYRICA) 50 MG capsule Take 1 capsule (50 mg total) by mouth 3 (three) times  daily. (Patient not taking: Reported on 12/01/2018) 20 capsule 0   No facility-administered medications prior to visit.      Review of Systems  Constitutional:   No  weight loss, night sweats,   +Fevers,  , fatigue, or  lassitude.  HEENT:   No headaches,  Difficulty swallowing,  Tooth/dental problems, or  Sore throat,                No sneezing, itching, ear ache, nasal congestion, post nasal drip,   CV:  No chest pain,  Orthopnea, PND, swelling in lower extremities, anasarca, dizziness, palpitations, syncope.   GI  No heartburn, indigestion, abdominal pain, nausea, vomiting, diarrhea, change in bowel habits, loss of appetite, bloody stools.   Resp:   No chest wall deformity  Skin: no rash or lesions.  GU: no dysuria, change in color of urine, no urgency or frequency.  No flank pain, no hematuria   MS:  No joint pain or swelling.  No decreased range of motion.  No back pain.    Physical Exam  BP 122/80 (BP Location: Left Arm, Cuff Size: Large)   Pulse 68   Temp 98.4 F (36.9 C) (Oral)   Ht 5\' 9"  (1.753 m)   Wt (!) 319 lb (144.7 kg)   LMP 02/19/2016   SpO2 100%   BMI 47.11 kg/m   GEN: A/Ox3; pleasant , NAD, obese    HEENT:  [place]/AT,  EACs-clear, TMs-wnl, NOSE-clear drainage , THROAT-clear, no lesions, no postnasal drip or exudate noted.   NECK:  Supple w/ fair ROM; no JVD; normal carotid impulses w/o bruits; no thyromegaly or nodules palpated; no lymphadenopathy.    RESP  Clear  P & A; w/o, wheezes/ rales/ or rhonchi. no accessory muscle use, no dullness to percussion  CARD:  RRR, no m/r/g, no peripheral edema, pulses intact, no cyanosis or clubbing.  GI:   Soft & nt; nml bowel sounds; no organomegaly or masses detected.   Musco: Warm bil, no deformities or joint swelling noted.   Neuro: alert, no focal deficits noted.    Skin: Warm, no lesions or rashes    Lab Results:  CBC  BMET  BNP  Imaging: No results found.  levalbuterol (XOPENEX) nebulizer  solution 0.63 mg    Date Action Dose Route User   12/01/2018 1018 Given 0.63 mg Nebulization Rinaldo Ratel, CMA      PFT Results Latest Ref Rng & Units 10/25/2016  FVC-Pre L 3.28  FVC-Predicted Pre % 85  FVC-Post L 3.20  FVC-Predicted Post % 83  Pre FEV1/FVC % % 86  Post FEV1/FCV % % 88  FEV1-Pre L 2.83  FEV1-Predicted Pre % 90  FEV1-Post L 2.83  DLCO UNC% % 70  DLCO COR %Predicted % 96  TLC L 4.56  TLC % Predicted % 75  RV % Predicted % 65    No results found for: NITRICOXIDE      Assessment & Plan:   Cough variant asthma vs UACS  Mild flare with URI suspect viral in nature with normal VS .  xopenex neb x 1   Plan  Patient Instructions  Begin Zyrtec 10mg  At bedtime for 1 week then As needed   Mucinex DM Twice daily  As needed  Cough /congestion  Tessalon Three times a day  As needed  Cough  Fluids and rest .  Continue on Dulera 2 puffs Twice daily  , rinse after use.  Saline nasal rinses As needed   Follow up with Dr. Melvyn Novas  In 3 months and As needed   Please contact office for sooner follow up if symptoms do not improve or worsen or seek emergency care          Rexene Edison, NP 12/01/2018

## 2018-12-05 ENCOUNTER — Emergency Department (HOSPITAL_COMMUNITY)
Admission: EM | Admit: 2018-12-05 | Discharge: 2018-12-05 | Disposition: A | Discharge status: Home / Self Care | Payer: BLUE CROSS/BLUE SHIELD | Attending: Emergency Medicine | Admitting: Emergency Medicine

## 2018-12-05 ENCOUNTER — Emergency Department (HOSPITAL_COMMUNITY): Payer: BLUE CROSS/BLUE SHIELD

## 2018-12-05 ENCOUNTER — Telehealth: Payer: Self-pay | Admitting: Adult Health

## 2018-12-05 ENCOUNTER — Other Ambulatory Visit: Payer: Self-pay | Admitting: Medical

## 2018-12-05 ENCOUNTER — Encounter (HOSPITAL_COMMUNITY): Payer: Self-pay | Admitting: *Deleted

## 2018-12-05 DIAGNOSIS — F321 Major depressive disorder, single episode, moderate: Secondary | ICD-10-CM

## 2018-12-05 DIAGNOSIS — Z79899 Other long term (current) drug therapy: Secondary | ICD-10-CM | POA: Insufficient documentation

## 2018-12-05 DIAGNOSIS — I10 Essential (primary) hypertension: Secondary | ICD-10-CM | POA: Diagnosis not present

## 2018-12-05 DIAGNOSIS — J029 Acute pharyngitis, unspecified: Secondary | ICD-10-CM

## 2018-12-05 DIAGNOSIS — Z87891 Personal history of nicotine dependence: Secondary | ICD-10-CM | POA: Insufficient documentation

## 2018-12-05 DIAGNOSIS — J4521 Mild intermittent asthma with (acute) exacerbation: Secondary | ICD-10-CM | POA: Insufficient documentation

## 2018-12-05 DIAGNOSIS — J209 Acute bronchitis, unspecified: Secondary | ICD-10-CM

## 2018-12-05 DIAGNOSIS — R51 Headache: Secondary | ICD-10-CM | POA: Diagnosis present

## 2018-12-05 LAB — GROUP A STREP BY PCR: Group A Strep by PCR: NOT DETECTED

## 2018-12-05 MED ORDER — IPRATROPIUM-ALBUTEROL 0.5-2.5 (3) MG/3ML IN SOLN
3.0000 mL | Freq: Once | RESPIRATORY_TRACT | Status: AC
  Administered 2018-12-05: 3 mL via RESPIRATORY_TRACT
  Filled 2018-12-05: qty 3

## 2018-12-05 MED ORDER — HYDROCODONE-ACETAMINOPHEN 5-325 MG PO TABS: 1.0000 | ORAL_TABLET | ORAL | 0 refills | Status: DC | PRN

## 2018-12-05 MED ORDER — DEXAMETHASONE SODIUM PHOSPHATE 10 MG/ML IJ SOLN
10.0000 mg | Freq: Once | INTRAMUSCULAR | Status: AC
  Administered 2018-12-05: 10 mg via INTRAMUSCULAR
  Filled 2018-12-05: qty 1

## 2018-12-05 MED ORDER — ALBUTEROL SULFATE (2.5 MG/3ML) 0.083% IN NEBU: 2.5000 mg | INHALATION_SOLUTION | Freq: Four times a day (QID) | RESPIRATORY_TRACT | 12 refills | Status: DC | PRN

## 2018-12-05 MED ORDER — HYDROCODONE-ACETAMINOPHEN 5-325 MG PO TABS
1.0000 | ORAL_TABLET | Freq: Once | ORAL | Status: AC
  Administered 2018-12-05: 1 via ORAL
  Filled 2018-12-05: qty 1

## 2018-12-05 MED ORDER — PREDNISONE 10 MG (21) PO TBPK: ORAL_TABLET | ORAL | 0 refills | Status: DC

## 2018-12-05 MED ORDER — DOXYCYCLINE HYCLATE 100 MG PO CAPS: 100.0000 mg | ORAL_CAPSULE | Freq: Two times a day (BID) | ORAL | 0 refills | Status: DC

## 2018-12-05 MED ORDER — DOXYCYCLINE HYCLATE 100 MG PO TABS
100.0000 mg | ORAL_TABLET | Freq: Once | ORAL | Status: AC
  Administered 2018-12-05: 100 mg via ORAL
  Filled 2018-12-05: qty 1

## 2018-12-05 MED ORDER — OXYCODONE HCL 5 MG PO TABS
5.0000 mg | ORAL_TABLET | Freq: Once | ORAL | Status: AC
  Administered 2018-12-05: 5 mg via ORAL
  Filled 2018-12-05: qty 1

## 2018-12-05 NOTE — Telephone Encounter (Signed)
Called pt who stated she still has a cough that she states has become worse since OV with TP 12/01/18. Pt also has complaints of chest tightness and chest pain.  While speaking with pt, she stated that she was currently on her way to the ED to be checked out.  Routing to TP as an [place].

## 2018-12-05 NOTE — ED Notes (Signed)
Bed: WA02 Expected date:  Expected time:  Means of arrival:  Comments: 

## 2018-12-05 NOTE — ED Triage Notes (Signed)
Pt complains of cough, sore throat, pain in chest, headache. Pt went to PCP last week and was given medication for bronchitis but states symptoms have not improved.

## 2018-12-05 NOTE — ED Provider Notes (Signed)
[facility] DEPT Provider Note   CSN: [id number] Arrival date & time: 12/05/18  0907     History   Chief Complaint Chief Complaint  Patient presents with  . Headache  . Cough  . Shortness of Breath    HPI Victoria Moore is a 42 y.o. female.  Pt presents to the ED today with headache, cough, sore throat, and SOB.  Pt said she's been sick for several days.  She did see her pcp on Friday, 12/6 for the same.  She was given a rx of tessalon perles and told to take mucinex and antihistamines.  She does not feel any better.  She said she's been having a hard time sleeping due to sob.  She has not had a fever.  She has also been using her inhalers, but they have not been helping.     Past Medical History:  Diagnosis Date  . Allergy   . Anemia   . Anxiety   . Arthritis   . Asthma   . Blood transfusion without reported diagnosis   . Chronic kidney disease   . Common migraine with intractable migraine 10/07/2016  . Depression   . GERD (gastroesophageal reflux disease)   . Hypertension   . Migraine   . Sarcoidosis    personal history of  . Seizures ([place])    history of     Patient Active Problem List   Diagnosis Date Noted  . Low libido 11/27/2018  . Prolonged capillary refill time 10/04/2018  . Decreased pedal pulses 10/04/2018  . Spondylosis 09/15/2018  . Chronic low back pain with sciatica 09/15/2018  . Impaired fasting blood sugar 09/11/2018  . Weight gain 09/11/2018  . Edema 09/11/2018  . Frequency of urination 09/11/2018  . Right leg swelling 08/18/2018  . Mild sleep apnea 08/03/2018  . Hypokalemia 06/28/2018  . Medication side effect 06/22/2018  . Acute low back pain without sciatica 06/22/2018  . Personal history of sarcoidosis 05/26/2018  . Vitamin D deficiency 05/26/2018  . Encounter for health maintenance examination with abnormal findings 05/26/2018  . Depression, major, single episode, moderate ([place]) 05/26/2018  .  Urinary tract infection without hematuria 05/26/2018  . Daytime sleepiness 05/26/2018  . Chest wall pain 05/26/2018  . GERD (gastroesophageal reflux disease) 05/26/2018  . Essential hypertension, benign 11/15/2016  . 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 Morbid (severe) obesity due to excess calories ([place]) 10/23/2016  . Cough variant asthma vs UACS  10/23/2016  . Common migraine with intractable migraine 10/07/2016  . Dyspnea 04/15/2016  . Hilar adenopathy 04/15/2016  . Fibroids 03/11/2016  . Anemia 11/12/2014  . Iron deficiency 11/12/2014    Past Surgical History:  Procedure Laterality Date  . ABDOMINAL HYSTERECTOMY    . LAPAROSCOPIC OVARIAN CYSTECTOMY Left 03/11/2016   Procedure: LAPAROSCOPIC OVARIAN CYSTECTOMY;  Surgeon: Eldred Manges, MD;  Location: [facility];  Service: Gynecology;  Laterality: Left;  . LAPAROSCOPIC VAGINAL HYSTERECTOMY WITH SALPINGECTOMY Bilateral 03/11/2016   Procedure: LAPAROSCOPIC ASSISTED VAGINAL HYSTERECTOMY WITH SALPINGECTOMY;  Surgeon: Eldred Manges, MD;  Location: [facility];  Service: Gynecology;  Laterality: Bilateral;  . TUBAL LIGATION       OB History    Gravida  1   Para      Term      Preterm      AB      Living        SAB      TAB      Ectopic  Multiple      Live Births               Home Medications    Prior to Admission medications   Medication Sig Start Date End Date Taking? Authorizing Provider  albuterol (PROVENTIL HFA;VENTOLIN HFA) 108 (90 Base) MCG/ACT inhaler Inhale 1-2 puffs into the lungs every 4 (four) hours as needed for wheezing or shortness of breath (cough). 01/06/18  Yes Gildardo Pounds, NP  ALPRAZolam Duanne Moron) 1 MG tablet Take 0.5-1 tablets (0.5-1 mg total) by mouth daily as needed for anxiety. 11/01/18  Yes Tysinger, Camelia Eng, PA-C  amphetamine-dextroamphetamine (ADDERALL XR) 30 MG 24 hr capsule Take 30 mg by mouth as needed (adhd).    Yes [provider]  benzonatate (TESSALON) 200 MG  capsule Take 1 capsule (200 mg total) by mouth 3 (three) times daily as needed for cough. 12/01/18 12/01/19 Yes Parrett, Tammy S, NP  chlorthalidone (HYGROTON) 25 MG tablet Take 1 tablet (25 mg total) by mouth daily. 09/12/18  Yes Tysinger, Camelia Eng, PA-C  cyclobenzaprine (FLEXERIL) 10 MG tablet Take 1 tablet (10 mg total) by mouth at bedtime. As needed for back pain. 10/27/18  Yes Tysinger, Camelia Eng, PA-C  dexlansoprazole (DEXILANT) 60 MG capsule Take 1 capsule (60 mg total) by mouth daily. 09/13/18  Yes Cirigliano, Vito V, DO  ferrous sulfate 325 (65 FE) MG EC tablet Take 1 tablet (325 mg total) by mouth daily with breakfast. 08/31/18  Yes Libby Maw, MD  guaiFENesin (MUCINEX) 600 MG 12 hr tablet Take 1,200 mg by mouth 2 (two) times daily.   Yes [provider]  loratadine (CLARITIN) 10 MG tablet Take 10 mg by mouth daily.   Yes [provider]  mometasone-formoterol (DULERA) 100-5 MCG/ACT AERO Inhale 2 puffs into the lungs 2 (two) times daily. 11/24/17  Yes McClung, Dionne Bucy, PA-C  nitrofurantoin, macrocrystal-monohydrate, (MACROBID) 100 MG capsule Take 1 capsule (100 mg total) by mouth 2 (two) times daily. 11/27/18  Yes Tysinger, Camelia Eng, PA-C  Potassium Chloride ER 20 MEQ TBCR Take one daily. Patient taking differently: Take 20 mEq by mouth daily. Take one daily. 09/12/18  Yes Tysinger, Camelia Eng, PA-C  spironolactone (ALDACTONE) 25 MG tablet Take 1 tablet (25 mg total) by mouth daily. 10/04/18  Yes Tysinger, Camelia Eng, PA-C  albuterol (PROVENTIL) (2.5 MG/3ML) 0.083% nebulizer solution Take 3 mLs (2.5 mg total) by nebulization every 6 (six) hours as needed for wheezing or shortness of breath. 12/05/18   Isla Pence, MD  carvedilol (COREG CR) 20 MG 24 hr capsule TAKE 1 CAPSULE BY MOUTH EVERY DAY Patient not taking: Reported on 12/05/2018 06/30/18   Libby Maw, MD  doxycycline (VIBRAMYCIN) 100 MG capsule Take 1 capsule (100 mg total) by mouth 2 (two) times daily.  12/05/18   Isla Pence, MD  FLUoxetine (PROZAC) 20 MG tablet Take 1 tablet (20 mg total) by mouth daily. Patient not taking: Reported on 12/05/2018 11/01/18   Tysinger, Camelia Eng, PA-C  HYDROcodone-acetaminophen (NORCO/VICODIN) 5-325 MG tablet Take 1 tablet by mouth every 4 (four) hours as needed. 12/05/18   Isla Pence, MD  predniSONE (STERAPRED UNI-PAK 21 TAB) 10 MG (21) TBPK tablet Take 6 tabs for 2 days, then 5 for 2 days, then 4 for 2 days, then 3 for 2 days, 2 for 2 days, then 1 for 2 days 12/05/18   Isla Pence, MD  pregabalin (LYRICA) 50 MG capsule Take 1 capsule (50 mg total) by mouth 3 (three)  times daily. Patient not taking: Reported on 12/01/2018 09/15/18   Tysinger, Camelia Eng, PA-C    Family History Family History  Adopted: Yes  Problem Relation Age of Onset  . Other Son        Growing pains  . Asthma Mother   . Heart Problems Mother   . Migraines Mother   . Diabetes Father   . Peptic Ulcer Father   . Heart Problems Father     Social History Social History   Tobacco Use  . Smoking status: Former Smoker    Packs/day: 0.25    Years: 17.00    Pack years: 4.25    Types: Cigarettes    Last attempt to quit: 03/02/2013    Years since quitting: 5.7  . Smokeless tobacco: Never Used  Substance Use Topics  . Alcohol use: No    Alcohol/week: 0.0 standard drinks  . Drug use: No    Types: Marijuana    Comment: former - 6 yrs ago     Allergies   Aspirin   Review of Systems Review of Systems  HENT: Positive for sore throat.   Respiratory: Positive for cough and shortness of breath.   All other systems reviewed and are negative.    Physical Exam Updated Vital Signs BP 124/88   Pulse 78   Temp 98.7 F (37.1 C) (Oral)   Resp 18   Ht 5\' 9"  (1.753 m)   Wt (!) 143.3 kg   LMP 02/19/2016   SpO2 100%   BMI 46.67 kg/m   Physical Exam  Constitutional: She is oriented to person, place, and time. She appears well-developed and well-nourished.  HENT:  Head:  Normocephalic and atraumatic.  Mouth/Throat: Mucous membranes are dry. Posterior oropharyngeal erythema present.  Eyes: Pupils are equal, round, and reactive to light. EOM are normal.  Neck: Normal range of motion. Neck supple.  Cardiovascular: Normal rate and regular rhythm.  Pulmonary/Chest: Effort normal and breath sounds normal.  Abdominal: Soft. Bowel sounds are normal.  Musculoskeletal: Normal range of motion.  Lymphadenopathy:    She has cervical adenopathy.  Neurological: She is alert and oriented to person, place, and time. She has normal strength.  Skin: Skin is warm and dry. Capillary refill takes less than 2 seconds.  Psychiatric: She has a normal mood and affect. Her behavior is normal.  Nursing note and vitals reviewed.    ED Treatments / Results  Labs (all labs ordered are listed, but only abnormal results are displayed) Labs Reviewed  GROUP A STREP BY PCR    EKG None  Radiology Dg Chest 2 View  Result Date: 12/05/2018 CLINICAL DATA:  Cough, sore throat, mid chest pain, shortness of breath EXAM: CHEST - 2 VIEW COMPARISON:  05/20/2018 FINDINGS: Low lung volumes. Heart and mediastinal contours are within normal limits. No focal opacities or effusions. No acute bony abnormality. IMPRESSION: Low volumes.  No active cardiopulmonary disease. Electronically Signed   By: Rolm Baptise M.D.   On: 12/05/2018 10:18    Procedures Procedures (including critical care time)  Medications Ordered in ED Medications  ipratropium-albuterol (DUONEB) 0.5-2.5 (3) MG/3ML nebulizer solution 3 mL (3 mLs Nebulization Given 12/05/18 1108)  dexamethasone (DECADRON) injection 10 mg (10 mg Intramuscular Given 12/05/18 1214)  HYDROcodone-acetaminophen (NORCO/VICODIN) 5-325 MG per tablet 1 tablet (1 tablet Oral Given 12/05/18 1214)  doxycycline (VIBRA-TABS) tablet 100 mg (100 mg Oral Given 12/05/18 1318)  oxyCODONE (Oxy IR/ROXICODONE) immediate release tablet 5 mg (5 mg Oral Given 12/05/18  1318)  Initial Impression / Assessment and Plan / ED Course  I have reviewed the triage vital signs and the nursing notes.  Pertinent labs & imaging results that were available during my care of the patient were reviewed by me and considered in my medical decision making (see chart for details).    Pt is feeling much better.  She does not have strep or pna.  However, as sx have been going on for several days, she will be started on Doxy.  The pt also requested a rx for a nebulizer machine, so I did give her that.  She is given a rx for prednisone, albuterol for neb machine, and lortab.  She knows to return if worse.  F/u with pcp.  Final Clinical Impressions(s) / ED Diagnoses   Final diagnoses:  Acute bronchitis, unspecified organism  Pharyngitis, unspecified etiology  Mild intermittent asthma with exacerbation    ED Discharge Orders         Ordered    predniSONE (STERAPRED UNI-PAK 21 TAB) 10 MG (21) TBPK tablet     12/05/18 1308    HYDROcodone-acetaminophen (NORCO/VICODIN) 5-325 MG tablet  Every 4 hours PRN     12/05/18 1308    doxycycline (VIBRAMYCIN) 100 MG capsule  2 times daily     12/05/18 1308    DME Nebulizer machine     12/05/18 1311    albuterol (PROVENTIL) (2.5 MG/3ML) 0.083% nebulizer solution  Every 6 hours PRN     12/05/18 1311           Isla Pence, MD 12/05/18 1323

## 2018-12-06 NOTE — Telephone Encounter (Signed)
Is this ok to refill?  

## 2018-12-06 NOTE — Telephone Encounter (Signed)
ATC patient to check on her. Looks by notes she went to the ED yesterday and was discharged. Told patient to call if she needed anything. Will close message for now

## 2018-12-07 ENCOUNTER — Ambulatory Visit (HOSPITAL_COMMUNITY)
Admission: RE | Admit: 2018-12-07 | Discharge: 2018-12-07 | Disposition: A | Discharge status: Home / Self Care | Payer: BLUE CROSS/BLUE SHIELD | Source: Ambulatory Visit | Attending: Vascular Surgery | Admitting: Vascular Surgery

## 2018-12-07 ENCOUNTER — Encounter: Payer: Self-pay | Admitting: Vascular Surgery

## 2018-12-07 ENCOUNTER — Other Ambulatory Visit: Payer: Self-pay

## 2018-12-07 ENCOUNTER — Ambulatory Visit (INDEPENDENT_AMBULATORY_CARE_PROVIDER_SITE_OTHER): Payer: BLUE CROSS/BLUE SHIELD | Admitting: Vascular Surgery

## 2018-12-07 VITALS — BP 136/89 | HR 69 | Temp 98.0°F | Resp 20 | Ht 69.0 in | Wt 316.0 lb

## 2018-12-07 DIAGNOSIS — R0989 Other specified symptoms and signs involving the circulatory and respiratory systems: Secondary | ICD-10-CM

## 2018-12-07 DIAGNOSIS — M79604 Pain in right leg: Secondary | ICD-10-CM

## 2018-12-07 DIAGNOSIS — R609 Edema, unspecified: Secondary | ICD-10-CM | POA: Insufficient documentation

## 2018-12-07 DIAGNOSIS — M79605 Pain in left leg: Secondary | ICD-10-CM | POA: Diagnosis not present

## 2018-12-07 DIAGNOSIS — M79606 Pain in leg, unspecified: Secondary | ICD-10-CM | POA: Insufficient documentation

## 2018-12-07 HISTORY — DX: Pain in leg, unspecified: M79.606

## 2018-12-07 NOTE — Progress Notes (Addendum)
Requested by:  Carlena Hurl, PA-C [address] [place],  [address]  Reason for consultation: BLE pain   History of Present Illness   Victoria Moore is a 42 y.o. (04/27/1976) female who presents with chief complaint: bilateral leg pain.  She states about 2 to 3 months ago she started having persistent bilateral lower extremity pain from her toes up to her hips.  This pain is persistent at rest and is made worse with any movement.  She is also having cramping in bilateral lower extremities at night.  ABIs performed in October of this year demonstrated ratios of 1 with triphasic signals in right leg tibial vessels and biphasic signals in left leg tibial vessels.  She denies any excessive edema.  She used to be a very light smoker about 5 years ago however has quit.  She follows with her PCP for management of hypertension and prediabetes.  She denies any active tissue changes in bilateral lower extremities.    Past Medical History:  Diagnosis Date  . Allergy   . Anemia   . Anxiety   . Arthritis   . Asthma   . Blood transfusion without reported diagnosis   . Chronic kidney disease   . Common migraine with intractable migraine 10/07/2016  . Depression   . GERD (gastroesophageal reflux disease)   . Hypertension   . Migraine   . Sarcoidosis    personal history of  . Seizures ([place])    history of     Past Surgical History:  Procedure Laterality Date  . ABDOMINAL HYSTERECTOMY    . LAPAROSCOPIC OVARIAN CYSTECTOMY Left 03/11/2016   Procedure: LAPAROSCOPIC OVARIAN CYSTECTOMY;  Surgeon: Eldred Manges, MD;  Location: [facility];  Service: Gynecology;  Laterality: Left;  . LAPAROSCOPIC VAGINAL HYSTERECTOMY WITH SALPINGECTOMY Bilateral 03/11/2016   Procedure: LAPAROSCOPIC ASSISTED VAGINAL HYSTERECTOMY WITH SALPINGECTOMY;  Surgeon: Eldred Manges, MD;  Location: [facility];  Service: Gynecology;  Laterality: Bilateral;  . TUBAL LIGATION      Social History   Socioeconomic  History  . Marital status: Single    Spouse name: Not on file  . Number of children: 1  . Years of education: 25  . Highest education level: Not on file  Occupational History  . Occupation: [employer]  . Financial resource strain: Not on file  . Food insecurity:    Worry: Not on file    Inability: Not on file  . Transportation needs:    Medical: Not on file    Non-medical: Not on file  Tobacco Use  . Smoking status: Former Smoker    Packs/day: 0.25    Years: 17.00    Pack years: 4.25    Types: Cigarettes    Last attempt to quit: 03/02/2013    Years since quitting: 5.7  . Smokeless tobacco: Never Used  Substance and Sexual Activity  . Alcohol use: No    Alcohol/week: 0.0 standard drinks  . Drug use: No    Types: Marijuana    Comment: former - 6 yrs ago  . Sexual activity: Not on file  Lifestyle  . Physical activity:    Days per week: Not on file    Minutes per session: Not on file  . Stress: Not on file  Relationships  . Social connections:    Talks on phone: Not on file    Gets together: Not on file    Attends religious service: Not on file    Active  member of club or organization: Not on file    Attends meetings of clubs or organizations: Not on file    Relationship status: Not on file  . Intimate partner violence:    Fear of current or ex partner: Not on file    Emotionally abused: Not on file    Physically abused: Not on file    Forced sexual activity: Not on file  Other Topics Concern  . Not on file  Social History Narrative   She lives w/ her fiance'. She has one son.    Highest level of education:  12th grade, did not graduate. Currently back in school.   Right-handed   Caffeine: 1 cup of coffee some mornings    Family History  Adopted: Yes  Problem Relation Age of Onset  . Other Son        Growing pains  . Asthma Mother   . Heart Problems Mother   . Migraines Mother   . Diabetes Father   . Peptic Ulcer Father   . Heart Problems  Father     Current Outpatient Medications  Medication Sig Dispense Refill  . albuterol (PROVENTIL HFA;VENTOLIN HFA) 108 (90 Base) MCG/ACT inhaler Inhale 1-2 puffs into the lungs every 4 (four) hours as needed for wheezing or shortness of breath (cough). 1 Inhaler 1  . albuterol (PROVENTIL) (2.5 MG/3ML) 0.083% nebulizer solution Take 3 mLs (2.5 mg total) by nebulization every 6 (six) hours as needed for wheezing or shortness of breath. 75 mL 12  . ALPRAZolam (XANAX) 1 MG tablet Take 0.5-1 tablets (0.5-1 mg total) by mouth daily as needed for anxiety. 30 tablet 0  . amphetamine-dextroamphetamine (ADDERALL XR) 30 MG 24 hr capsule Take 30 mg by mouth as needed (adhd).     . benzonatate (TESSALON) 200 MG capsule Take 1 capsule (200 mg total) by mouth 3 (three) times daily as needed for cough. 30 capsule 1  . carvedilol (COREG CR) 20 MG 24 hr capsule TAKE 1 CAPSULE BY MOUTH EVERY DAY 90 capsule 0  . chlorthalidone (HYGROTON) 25 MG tablet Take 1 tablet (25 mg total) by mouth daily. 90 tablet 0  . cyclobenzaprine (FLEXERIL) 10 MG tablet Take 1 tablet (10 mg total) by mouth at bedtime. As needed for back pain. 30 tablet 0  . dexlansoprazole (DEXILANT) 60 MG capsule Take 1 capsule (60 mg total) by mouth daily. 90 capsule 3  . doxycycline (VIBRAMYCIN) 100 MG capsule Take 1 capsule (100 mg total) by mouth 2 (two) times daily. 14 capsule 0  . ferrous sulfate 325 (65 FE) MG EC tablet Take 1 tablet (325 mg total) by mouth daily with breakfast. 90 tablet 1  . FLUoxetine (PROZAC) 20 MG tablet Take 1 tablet (20 mg total) by mouth daily. 90 tablet 0  . guaiFENesin (MUCINEX) 600 MG 12 hr tablet Take 1,200 mg by mouth 2 (two) times daily.    Marland Kitchen HYDROcodone-acetaminophen (NORCO/VICODIN) 5-325 MG tablet Take 1 tablet by mouth every 4 (four) hours as needed. 10 tablet 0  . loratadine (CLARITIN) 10 MG tablet Take 10 mg by mouth daily.    . mometasone-formoterol (DULERA) 100-5 MCG/ACT AERO Inhale 2 puffs into the lungs 2  (two) times daily. 1 Inhaler 5  . nitrofurantoin, macrocrystal-monohydrate, (MACROBID) 100 MG capsule Take 1 capsule (100 mg total) by mouth 2 (two) times daily. 14 capsule 0  . Potassium Chloride ER 20 MEQ TBCR Take one daily. (Patient taking differently: Take 20 mEq by mouth daily. Take one  daily.) 30 tablet 2  . predniSONE (STERAPRED UNI-PAK 21 TAB) 10 MG (21) TBPK tablet Take 6 tabs for 2 days, then 5 for 2 days, then 4 for 2 days, then 3 for 2 days, 2 for 2 days, then 1 for 2 days 42 tablet 0  . pregabalin (LYRICA) 50 MG capsule Take 1 capsule (50 mg total) by mouth 3 (three) times daily. 20 capsule 0  . spironolactone (ALDACTONE) 25 MG tablet Take 1 tablet (25 mg total) by mouth daily. 30 tablet 2   No current facility-administered medications for this visit.     Allergies  Allergen Reactions  . Aspirin Anaphylaxis and Hives         REVIEW OF SYSTEMS (negative unless checked):   Cardiac:  []  Chest pain or chest pressure? [x]  Shortness of breath upon activity? []  Shortness of breath when lying flat? []  Irregular heart rhythm?  Vascular:  [x]  Pain in calf, thigh, or hip brought on by walking? [x]  Pain in feet at night that wakes you up from your sleep? []  Blood clot in your veins? [x]  Leg swelling?  Pulmonary:  []  Oxygen at home? []  Productive cough? []  Wheezing?  Neurologic:  []  Sudden weakness in arms or legs? []  Sudden numbness in arms or legs? []  Sudden onset of difficult speaking or slurred speech? []  Temporary loss of vision in one eye? []  Problems with dizziness?  Gastrointestinal:  []  Blood in stool? []  Vomited blood?  Genitourinary:  []  Burning when urinating? []  Blood in urine?  Psychiatric:  []  Major depression  Hematologic:  []  Bleeding problems? []  Problems with blood clotting?  Dermatologic:  []  Rashes or ulcers?  Constitutional:  []  Fever or chills?  Ear/Nose/Throat:  []  Change in hearing? []  Nose bleeds? []  Sore  throat?  Musculoskeletal:  []  Back pain? []  Joint pain? []  Muscle pain?   For VQI Use Only   PRE-ADM LIVING Home  AMB STATUS Ambulatory  CAD Sx None  PRIOR CHF None  STRESS TEST No   Physical Examination     Vitals:   12/07/18 1348  BP: 136/89  Pulse: 69  Resp: 20  Temp: 98 F (36.7 C)  SpO2: 96%  Weight: (!) 316 lb (143.3 kg)  Height: 5\' 9"  (1.753 m)   Body mass index is 46.67 kg/m.  General Alert, O x 3, Obese, NAD  Pulmonary Sym exp, good B air movt,   Cardiac RRR, Nl S1, S2  Vascular Vessel Right Left  Radial Palpable Palpable  Brachial Palpable Palpable  Aorta Not palpable N/A  Femoral Palpable Palpable  Popliteal Not palpable Not palpable  PT Not palpable Not palpable  DP Palpable Palpable    Gastro- intestinal soft, non-distended, non-tender to palpation,   Musculo- skeletal M/S 5/5 throughout  , Extremities without ischemic changes  , No edema present, No visible varicosities , No Lipodermatosclerosis present  Neurologic Cranial nerves 2-12 intact , Pain and light touch intact in extremities , Motor exam as listed above  Psychiatric Judgement intact, Mood & affect appropriate for pt's clinical situation  Dermatologic See M/S exam for extremity exam, No rashes otherwise noted  Lymphatic  Palpable lymph nodes: None    Non-Invasive Vascular imaging    Saphenofemoral junction greater than 5 mm bilaterally  Reflux times noted in proximal right small saphenous vein however only 2 mm in diameter  Venous duplex negative for DVT bilateral lower extremities   Medical Decision Making   Victoria Moore is a 42 y.o. female who  presents with: bilateral leg pain of unknown origin   This patient was seen in conjunction with Dr. Oneida Alar  Venous reflux study negative for DVT; no significant reflux noted in deep or superficial system bilaterally  Normal ABIs 09/2018 with triphasic right tibial artery signals and biphasic left tibial artery signals  Due  to patient's history of pain extending up to hips and lower back as well as her risk factors of prediabetes and hypertension we will rule out aortoiliac disease with CTA of the aorta with runoff  She will follow-up to go over CT scan  Encouraged weight loss and active lifestyle   Dagoberto Ligas PA-C [facility]: [phone]  12/07/2018, 2:38 PM

## 2018-12-08 ENCOUNTER — Other Ambulatory Visit: Payer: Self-pay

## 2018-12-08 DIAGNOSIS — R0989 Other specified symptoms and signs involving the circulatory and respiratory systems: Secondary | ICD-10-CM

## 2018-12-08 DIAGNOSIS — M79605 Pain in left leg: Secondary | ICD-10-CM

## 2018-12-08 DIAGNOSIS — M79604 Pain in right leg: Secondary | ICD-10-CM

## 2018-12-11 NOTE — Progress Notes (Signed)
Chart and office note reviewed in detail  > agree with a/p as outlined    

## 2018-12-25 ENCOUNTER — Telehealth: Payer: Self-pay | Admitting: Pulmonary Disease

## 2018-12-25 NOTE — Telephone Encounter (Signed)
Called and lmom for patient to see if she has insurance [employer] states she does not not so the did not start her on a cpap also to reschedule the apt 12/28/18 this was to follow up on cpap. I will call back.

## 2018-12-26 NOTE — Telephone Encounter (Signed)
Left message to call back  

## 2018-12-28 ENCOUNTER — Ambulatory Visit: Payer: BLUE CROSS/BLUE SHIELD | Admitting: Pulmonary Disease

## 2018-12-29 NOTE — Telephone Encounter (Signed)
Please see my Chart message regarding this matter.

## 2018-12-30 ENCOUNTER — Other Ambulatory Visit: Payer: Self-pay | Admitting: Medical

## 2018-12-30 DIAGNOSIS — M545 Low back pain, unspecified: Secondary | ICD-10-CM

## 2019-01-01 NOTE — Telephone Encounter (Signed)
Is this ok to refill?  

## 2019-01-02 ENCOUNTER — Ambulatory Visit: Payer: BLUE CROSS/BLUE SHIELD | Admitting: Medical

## 2019-01-04 NOTE — Telephone Encounter (Signed)
This MyChart encounter was inadvertently sent to the incorrect pool and therefor was not able to be seen by Pulmonary. Pt has been seen by Pulmonary on 12.6.19. Will sign off.

## 2019-01-05 ENCOUNTER — Ambulatory Visit: Payer: Self-pay | Admitting: Gastroenterology

## 2019-01-22 ENCOUNTER — Other Ambulatory Visit: Payer: Self-pay | Admitting: Vascular Surgery

## 2019-01-23 ENCOUNTER — Telehealth: Payer: Self-pay | Admitting: Gastroenterology

## 2019-01-23 NOTE — Telephone Encounter (Signed)
I have call this patient and left a message for her to return my call.

## 2019-01-25 ENCOUNTER — Ambulatory Visit: Payer: BLUE CROSS/BLUE SHIELD | Admitting: Vascular Surgery

## 2019-01-25 ENCOUNTER — Other Ambulatory Visit: Payer: BLUE CROSS/BLUE SHIELD

## 2019-01-25 ENCOUNTER — Encounter: Payer: Self-pay | Admitting: Vascular Surgery

## 2019-01-25 NOTE — Telephone Encounter (Signed)
Called patient left message for return phone call.

## 2019-01-29 NOTE — Telephone Encounter (Signed)
Called patient left a message

## 2019-02-01 DIAGNOSIS — N83292 Other ovarian cyst, left side: Secondary | ICD-10-CM | POA: Insufficient documentation

## 2019-02-01 DIAGNOSIS — D869 Sarcoidosis, unspecified: Secondary | ICD-10-CM | POA: Insufficient documentation

## 2019-03-01 ENCOUNTER — Other Ambulatory Visit: Payer: Self-pay | Admitting: Medical

## 2019-03-01 DIAGNOSIS — F321 Major depressive disorder, single episode, moderate: Secondary | ICD-10-CM

## 2019-03-02 ENCOUNTER — Ambulatory Visit: Payer: BLUE CROSS/BLUE SHIELD | Admitting: Internal Medicine

## 2019-04-18 ENCOUNTER — Other Ambulatory Visit: Payer: Self-pay

## 2019-04-18 ENCOUNTER — Encounter (HOSPITAL_COMMUNITY): Payer: Self-pay

## 2019-04-18 ENCOUNTER — Emergency Department (HOSPITAL_COMMUNITY): Payer: BLUE CROSS/BLUE SHIELD

## 2019-04-18 ENCOUNTER — Emergency Department (HOSPITAL_COMMUNITY)
Admission: EM | Admit: 2019-04-18 | Discharge: 2019-04-18 | Disposition: A | Discharge status: Home / Self Care | Payer: BLUE CROSS/BLUE SHIELD | Attending: Emergency Medicine | Admitting: Emergency Medicine

## 2019-04-18 DIAGNOSIS — N189 Chronic kidney disease, unspecified: Secondary | ICD-10-CM | POA: Diagnosis not present

## 2019-04-18 DIAGNOSIS — E876 Hypokalemia: Secondary | ICD-10-CM | POA: Insufficient documentation

## 2019-04-18 DIAGNOSIS — M5416 Radiculopathy, lumbar region: Secondary | ICD-10-CM | POA: Insufficient documentation

## 2019-04-18 DIAGNOSIS — I129 Hypertensive chronic kidney disease with stage 1 through stage 4 chronic kidney disease, or unspecified chronic kidney disease: Secondary | ICD-10-CM | POA: Insufficient documentation

## 2019-04-18 DIAGNOSIS — J45909 Unspecified asthma, uncomplicated: Secondary | ICD-10-CM | POA: Insufficient documentation

## 2019-04-18 DIAGNOSIS — Z79899 Other long term (current) drug therapy: Secondary | ICD-10-CM | POA: Insufficient documentation

## 2019-04-18 DIAGNOSIS — M545 Low back pain, unspecified: Secondary | ICD-10-CM

## 2019-04-18 DIAGNOSIS — Z87891 Personal history of nicotine dependence: Secondary | ICD-10-CM | POA: Insufficient documentation

## 2019-04-18 DIAGNOSIS — M5410 Radiculopathy, site unspecified: Secondary | ICD-10-CM

## 2019-04-18 LAB — COMPREHENSIVE METABOLIC PANEL
ALT: 17 U/L (ref 0–44)
AST: 21 U/L (ref 15–41)
Albumin: 3.8 g/dL (ref 3.5–5.0)
Alkaline Phosphatase: 50 U/L (ref 38–126)
Anion gap: 9 (ref 5–15)
BUN: 5 mg/dL — ABNORMAL LOW (ref 6–20)
CO2: 22 mmol/L (ref 22–32)
Calcium: 8.9 mg/dL (ref 8.9–10.3)
Chloride: 105 mmol/L (ref 98–111)
Creatinine, Ser: 1 mg/dL (ref 0.44–1.00)
GFR calc Af Amer: >60 mL/min (ref >60)
GFR calc non Af Amer: >60 mL/min (ref >60)
Glucose, Bld: 127 mg/dL — ABNORMAL HIGH (ref 70–99)
Potassium: 3.2 mmol/L — ABNORMAL LOW (ref 3.5–5.1)
Sodium: 136 mmol/L (ref 135–145)
Total Bilirubin: 0.4 mg/dL (ref 0.3–1.2)
Total Protein: 6.8 g/dL (ref 6.5–8.1)

## 2019-04-18 LAB — URINALYSIS, ROUTINE W REFLEX MICROSCOPIC
Bacteria, UA: NONE SEEN
Glucose, UA: NEGATIVE mg/dL
Hgb urine dipstick: NEGATIVE
Ketones, ur: 5 mg/dL — AB
Nitrite: NEGATIVE
Protein, ur: 30 mg/dL — AB
Specific Gravity, Urine: 1.03 (ref 1.005–1.030)
pH: 5 (ref 5.0–8.0)

## 2019-04-18 LAB — CBC WITH DIFFERENTIAL/PLATELET
Abs Immature Granulocytes: 0.01 10*3/uL (ref 0.00–0.07)
Basophils Absolute: 0 10*3/uL (ref 0.0–0.1)
Basophils Relative: 1 %
Eosinophils Absolute: 0.3 10*3/uL (ref 0.0–0.5)
Eosinophils Relative: 5 %
HCT: 37.3 % (ref 36.0–46.0)
Hemoglobin: 11.7 g/dL — ABNORMAL LOW (ref 12.0–15.0)
Immature Granulocytes: 0 %
Lymphocytes Relative: 38 %
Lymphs Abs: 1.9 10*3/uL (ref 0.7–4.0)
MCH: 26.6 pg (ref 26.0–34.0)
MCHC: 31.4 g/dL (ref 30.0–36.0)
MCV: 84.8 fL (ref 80.0–100.0)
Monocytes Absolute: 0.3 10*3/uL (ref 0.1–1.0)
Monocytes Relative: 6 %
Neutro Abs: 2.5 10*3/uL (ref 1.7–7.7)
Neutrophils Relative %: 50 %
Platelets: 216 10*3/uL (ref 150–400)
RBC: 4.4 MIL/uL (ref 3.87–5.11)
RDW: 13.6 % (ref 11.5–15.5)
WBC: 5.1 10*3/uL (ref 4.0–10.5)
nRBC: 0 % (ref 0.0–0.2)

## 2019-04-18 LAB — LIPASE, BLOOD: Lipase: 25 U/L (ref 11–51)

## 2019-04-18 MED ORDER — CYCLOBENZAPRINE HCL 10 MG PO TABS
10.0000 mg | ORAL_TABLET | Freq: Once | ORAL | Status: AC
  Administered 2019-04-18: 10 mg via ORAL
  Filled 2019-04-18: qty 1

## 2019-04-18 MED ORDER — SODIUM CHLORIDE 0.9 % IV BOLUS
1000.0000 mL | Freq: Once | INTRAVENOUS | Status: AC
  Administered 2019-04-18: 1000 mL via INTRAVENOUS

## 2019-04-18 MED ORDER — LIDOCAINE 5 % EX PTCH
1.0000 | MEDICATED_PATCH | CUTANEOUS | Status: DC
  Administered 2019-04-18: 1 via TRANSDERMAL
  Filled 2019-04-18: qty 1

## 2019-04-18 MED ORDER — ONDANSETRON HCL 4 MG/2ML IJ SOLN
4.0000 mg | Freq: Once | INTRAMUSCULAR | Status: AC
  Administered 2019-04-18: 4 mg via INTRAVENOUS
  Filled 2019-04-18: qty 2

## 2019-04-18 MED ORDER — POTASSIUM CHLORIDE CRYS ER 20 MEQ PO TBCR
40.0000 meq | EXTENDED_RELEASE_TABLET | Freq: Once | ORAL | Status: AC
  Administered 2019-04-18: 04:00:00 40 meq via ORAL
  Filled 2019-04-18: qty 2

## 2019-04-18 MED ORDER — LIDOCAINE 5 % EX PTCH: 1.0000 | MEDICATED_PATCH | CUTANEOUS | 0 refills | Status: DC

## 2019-04-18 MED ORDER — CYCLOBENZAPRINE HCL 10 MG PO TABS: ORAL_TABLET | ORAL | 0 refills | Status: DC

## 2019-04-18 MED ORDER — MORPHINE SULFATE (PF) 4 MG/ML IV SOLN
4.0000 mg | Freq: Once | INTRAVENOUS | Status: AC
  Administered 2019-04-18: 4 mg via INTRAVENOUS
  Filled 2019-04-18: qty 1

## 2019-04-18 MED ORDER — IOHEXOL 300 MG/ML  SOLN
100.0000 mL | Freq: Once | INTRAMUSCULAR | Status: AC | PRN
  Administered 2019-04-18: 100 mL via INTRAVENOUS

## 2019-04-18 NOTE — ED Triage Notes (Signed)
Pt complains of back pain for the past three days. Pt denies lifting anything heavy or trauma. Pt denies any bowel or bladder changes. Pt also notes nausea without emesis.

## 2019-04-18 NOTE — ED Provider Notes (Signed)
[facility] EMERGENCY DEPARTMENT Provider Note   CSN: [id number] Arrival date & time: 04/18/19  0134    History   Chief Complaint Chief Complaint  Patient presents with  . Back Pain    HPI Victoria Moore is a 43 y.o. female.     43 year old female multiple medical problems including possible sarcoidosis, chronic back pain presents with complaint of low back pain x3 days and nausea.  Patient states the pain radiates to bilateral lower extremities.  Patient describes circumferential spasming of her left thigh and diffuse right lower leg pain to her ankle.  Patient states spasming and describes this as a sharp shooting pain.  Patient reports onset upon waking 3 days ago, denies any falls or injuries prior to this, denies doing any bending over or heavy lifting the day before the onset of her pain.  Patient has a history of previous pinched nerve in her back.  Patient denies abdominal pain or vomiting, changes in bowel or bladder habits.  Prior abdominal surgeries include tubal ligation (hysterectomy).  Patient states that she does not drink much water and suspect she is dehydrated, feels thirsty all the time, reports history of prediabetes.  Patient has been taking Tylenol No. 4, applying Aspercreme and BenGay without improvement in her back pain.  No other complaints or concerns.     Past Medical History:  Diagnosis Date  . Allergy   . Anemia   . Anxiety   . Arthritis   . Asthma   . Blood transfusion without reported diagnosis   . Chronic kidney disease   . Common migraine with intractable migraine 10/07/2016  . Depression   . GERD (gastroesophageal reflux disease)   . Hypertension   . Migraine   . Sarcoidosis    personal history of  . Seizures ([place])    history of     Patient Active Problem List   Diagnosis Date Noted  . Leg pain 12/07/2018  . Low libido 11/27/2018  . Prolonged capillary refill time 10/04/2018  . Decreased pedal pulses 10/04/2018  .  Spondylosis 09/15/2018  . Chronic low back pain with sciatica 09/15/2018  . Impaired fasting blood sugar 09/11/2018  . Weight gain 09/11/2018  . Edema 09/11/2018  . Frequency of urination 09/11/2018  . Right leg swelling 08/18/2018  . Mild sleep apnea 08/03/2018  . Hypokalemia 06/28/2018  . Medication side effect 06/22/2018  . Acute low back pain without sciatica 06/22/2018  . Personal history of sarcoidosis 05/26/2018  . Vitamin D deficiency 05/26/2018  . Encounter for health maintenance examination with abnormal findings 05/26/2018  . Depression, major, single episode, moderate ([place]) 05/26/2018  . Urinary tract infection without hematuria 05/26/2018  . Daytime sleepiness 05/26/2018  . Chest wall pain 05/26/2018  . GERD (gastroesophageal reflux disease) 05/26/2018  . Essential hypertension, benign 11/15/2016  . 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 Morbid (severe) obesity due to excess calories ([place]) 10/23/2016  . Cough variant asthma vs UACS  10/23/2016  . Common migraine with intractable migraine 10/07/2016  . Dyspnea 04/15/2016  . Hilar adenopathy 04/15/2016  . Fibroids 03/11/2016  . Anemia 11/12/2014  . Iron deficiency 11/12/2014    Past Surgical History:  Procedure Laterality Date  . ABDOMINAL HYSTERECTOMY    . LAPAROSCOPIC OVARIAN CYSTECTOMY Left 03/11/2016   Procedure: LAPAROSCOPIC OVARIAN CYSTECTOMY;  Surgeon: Eldred Manges, MD;  Location: [facility];  Service: Gynecology;  Laterality: Left;  . LAPAROSCOPIC VAGINAL HYSTERECTOMY WITH SALPINGECTOMY Bilateral 03/11/2016   Procedure: LAPAROSCOPIC  ASSISTED VAGINAL HYSTERECTOMY WITH SALPINGECTOMY;  Surgeon: Eldred Manges, MD;  Location: [facility];  Service: Gynecology;  Laterality: Bilateral;  . TUBAL LIGATION       OB History    Gravida  1   Para      Term      Preterm      AB      Living        SAB      TAB      Ectopic      Multiple      Live Births               Home Medications    Prior  to Admission medications   Medication Sig Start Date End Date Taking? Authorizing Provider  albuterol (PROVENTIL HFA;VENTOLIN HFA) 108 (90 Base) MCG/ACT inhaler Inhale 1-2 puffs into the lungs every 4 (four) hours as needed for wheezing or shortness of breath (cough). 01/06/18   Gildardo Pounds, NP  albuterol (PROVENTIL) (2.5 MG/3ML) 0.083% nebulizer solution Take 3 mLs (2.5 mg total) by nebulization every 6 (six) hours as needed for wheezing or shortness of breath. 12/05/18   Isla Pence, MD  ALPRAZolam Duanne Moron) 1 MG tablet TAKE 1/2 TO 1 TABLET DAILY AS NEEDED FOR ANXIETY 03/01/19   Tysinger, Camelia Eng, PA-C  amphetamine-dextroamphetamine (ADDERALL XR) 30 MG 24 hr capsule Take 30 mg by mouth as needed (adhd).     [provider]  benzonatate (TESSALON) 200 MG capsule Take 1 capsule (200 mg total) by mouth 3 (three) times daily as needed for cough. 12/01/18 12/01/19  Parrett, Fonnie Mu, NP  carvedilol (COREG CR) 20 MG 24 hr capsule TAKE 1 CAPSULE BY MOUTH EVERY DAY 06/30/18   Libby Maw, MD  chlorthalidone (HYGROTON) 25 MG tablet Take 1 tablet (25 mg total) by mouth daily. 09/12/18   Tysinger, Camelia Eng, PA-C  cyclobenzaprine (FLEXERIL) 10 MG tablet TAKE 1 TABLET BY MOUTH AT BEDTIME AS NEEDED FOR BACK PAIN 04/18/19   Tacy Learn, PA-C  dexlansoprazole (DEXILANT) 60 MG capsule Take 1 capsule (60 mg total) by mouth daily. 09/13/18   Cirigliano, Vito V, DO  doxycycline (VIBRAMYCIN) 100 MG capsule Take 1 capsule (100 mg total) by mouth 2 (two) times daily. 12/05/18   Isla Pence, MD  ferrous sulfate 325 (65 FE) MG EC tablet Take 1 tablet (325 mg total) by mouth daily with breakfast. 08/31/18   Libby Maw, MD  FLUoxetine (PROZAC) 20 MG tablet Take 1 tablet (20 mg total) by mouth daily. 11/01/18   Tysinger, Camelia Eng, PA-C  guaiFENesin (MUCINEX) 600 MG 12 hr tablet Take 1,200 mg by mouth 2 (two) times daily.    [provider]  HYDROcodone-acetaminophen (NORCO/VICODIN) 5-325  MG tablet Take 1 tablet by mouth every 4 (four) hours as needed. 12/05/18   Isla Pence, MD  lidocaine (LIDODERM) 5 % Place 1 patch onto the skin daily. Remove & Discard patch within 12 hours or as directed by MD 04/18/19   Tacy Learn, PA-C  loratadine (CLARITIN) 10 MG tablet Take 10 mg by mouth daily.    [provider]  mometasone-formoterol (DULERA) 100-5 MCG/ACT AERO Inhale 2 puffs into the lungs 2 (two) times daily. 11/24/17   Argentina Donovan, PA-C  nitrofurantoin, macrocrystal-monohydrate, (MACROBID) 100 MG capsule Take 1 capsule (100 mg total) by mouth 2 (two) times daily. 11/27/18   Tysinger, Camelia Eng, PA-C  Potassium Chloride ER 20 MEQ TBCR Take one daily.  Patient taking differently: Take 20 mEq by mouth daily. Take one daily. 09/12/18   Tysinger, Camelia Eng, PA-C  predniSONE (STERAPRED UNI-PAK 21 TAB) 10 MG (21) TBPK tablet Take 6 tabs for 2 days, then 5 for 2 days, then 4 for 2 days, then 3 for 2 days, 2 for 2 days, then 1 for 2 days 12/05/18   Isla Pence, MD  pregabalin (LYRICA) 50 MG capsule Take 1 capsule (50 mg total) by mouth 3 (three) times daily. 09/15/18   Tysinger, Camelia Eng, PA-C  spironolactone (ALDACTONE) 25 MG tablet Take 1 tablet (25 mg total) by mouth daily. 10/04/18   Tysinger, Camelia Eng, PA-C    Family History Family History  Adopted: Yes  Problem Relation Age of Onset  . Other Son        Growing pains  . Asthma Mother   . Heart Problems Mother   . Migraines Mother   . Diabetes Father   . Peptic Ulcer Father   . Heart Problems Father     Social History Social History   Tobacco Use  . Smoking status: Former Smoker    Packs/day: 0.25    Years: 17.00    Pack years: 4.25    Types: Cigarettes    Last attempt to quit: 03/02/2013    Years since quitting: 6.1  . Smokeless tobacco: Never Used  Substance Use Topics  . Alcohol use: No    Alcohol/week: 0.0 standard drinks  . Drug use: No    Types: Marijuana    Comment: former - 6 yrs ago      Allergies   Aspirin   Review of Systems Review of Systems  Constitutional: Negative for chills and fever.  Respiratory: Negative for shortness of breath.   Cardiovascular: Negative for chest pain.  Gastrointestinal: Positive for nausea. Negative for abdominal pain, constipation, diarrhea and vomiting.  Genitourinary: Negative for dysuria, frequency and urgency.  Musculoskeletal: Positive for back pain and myalgias.  Skin: Negative for rash and wound.  Neurological: Negative for weakness and numbness.  Psychiatric/Behavioral: Negative for confusion.  All other systems reviewed and are negative.    Physical Exam Updated Vital Signs BP 124/78   Pulse 65   Temp 98.5 F (36.9 C) (Oral)   Resp 16   Ht 6' (1.829 m)   Wt (!) 141.5 kg   LMP 02/19/2016   SpO2 99%   BMI 42.31 kg/m   Physical Exam Vitals signs and nursing note reviewed.  Constitutional:      General: She is not in acute distress.    Appearance: She is well-developed. She is not diaphoretic.  HENT:     Head: Normocephalic and atraumatic.  Cardiovascular:     Rate and Rhythm: Normal rate and regular rhythm.     Pulses: Normal pulses.     Heart sounds: Normal heart sounds.  Pulmonary:     Effort: Pulmonary effort is normal.     Breath sounds: Normal breath sounds.  Abdominal:     Tenderness: There is abdominal tenderness in the right lower quadrant. There is rebound. There is no right CVA tenderness or left CVA tenderness.     Comments: Mild TTP RLQ with rebound tenderness   Musculoskeletal:        General: Tenderness present.     Lumbar back: She exhibits tenderness. She exhibits no bony tenderness.       Back:     Comments: TTP right and left SI joint  Skin:    General:  Skin is warm and dry.     Findings: No erythema or rash.  Neurological:     General: No focal deficit present.     Mental Status: She is alert and oriented to person, place, and time.     Sensory: Sensation is intact. No sensory  deficit.     Motor: No weakness.     Deep Tendon Reflexes:     Reflex Scores:      Patellar reflexes are 1+ on the right side and 1+ on the left side.      Achilles reflexes are 1+ on the right side and 1+ on the left side. Psychiatric:        Behavior: Behavior normal.      ED Treatments / Results  Labs (all labs ordered are listed, but only abnormal results are displayed) Labs Reviewed  CBC WITH DIFFERENTIAL/PLATELET - Abnormal; Notable for the following components:      Result Value   Hemoglobin 11.7 (*)    All other components within normal limits  COMPREHENSIVE METABOLIC PANEL - Abnormal; Notable for the following components:   Potassium 3.2 (*)    Glucose, Bld 127 (*)    BUN 5 (*)    All other components within normal limits  URINALYSIS, ROUTINE W REFLEX MICROSCOPIC - Abnormal; Notable for the following components:   Color, Urine AMBER (*)    Bilirubin Urine SMALL (*)    Ketones, ur 5 (*)    Protein, ur 30 (*)    Leukocytes,Ua SMALL (*)    All other components within normal limits  LIPASE, BLOOD    EKG None  Radiology Ct Abdomen Pelvis W Contrast  Result Date: 04/18/2019 CLINICAL DATA:  Back pain, nausea EXAM: CT ABDOMEN AND PELVIS WITH CONTRAST TECHNIQUE: Multidetector CT imaging of the abdomen and pelvis was performed using the standard protocol following bolus administration of intravenous contrast. CONTRAST:  134mL OMNIPAQUE IOHEXOL 300 MG/ML  SOLN COMPARISON:  None. FINDINGS: Lower chest: Lung bases are clear. No effusions. Heart is normal size. Hepatobiliary: No focal hepatic abnormality. Gallbladder unremarkable. Focal fatty infiltration adjacent to the falciform ligament. Pancreas: No focal abnormality or ductal dilatation. Spleen: No focal abnormality.  Normal size. Adrenals/Urinary Tract: No adrenal abnormality. No focal renal abnormality. No stones or hydronephrosis. Urinary bladder is unremarkable. Stomach/Bowel: Normal appendix. Stomach, large and small  bowel grossly unremarkable. Vascular/Lymphatic: No evidence of aneurysm or adenopathy. Reproductive: Prior hysterectomy.  No adnexal masses. Other: No free fluid or free air. Musculoskeletal: No acute bony abnormality. IMPRESSION: No acute findings in the abdomen or pelvis. Electronically Signed   By: Rolm Baptise M.D.   On: 04/18/2019 03:37    Procedures Procedures (including critical care time)  Medications Ordered in ED Medications  lidocaine (LIDODERM) 5 % 1 patch (1 patch Transdermal Patch Applied 04/18/19 0410)  ondansetron (ZOFRAN) injection 4 mg (4 mg Intravenous Given 04/18/19 0219)  morphine 4 MG/ML injection 4 mg (4 mg Intravenous Given 04/18/19 0225)  sodium chloride 0.9 % bolus 1,000 mL (0 mLs Intravenous Stopped 04/18/19 0415)  iohexol (OMNIPAQUE) 300 MG/ML solution 100 mL (100 mLs Intravenous Contrast Given 04/18/19 0323)  cyclobenzaprine (FLEXERIL) tablet 10 mg (10 mg Oral Given 04/18/19 0412)  potassium chloride SA (K-DUR) CR tablet 40 mEq (40 mEq Oral Given 04/18/19 0412)     Initial Impression / Assessment and Plan / ED Course  I have reviewed the triage vital signs and the nursing notes.  Pertinent labs & imaging results that were available during  my care of the patient were reviewed by me and considered in my medical decision making (see chart for details).  Clinical Course as of Apr 17 432  Wed Apr 18, 2019  0430 43yo female with complaint of low back pain, radiating to bilateral lower extremities x 3 days without fall or injury, found to have RLQ tenderness with rebound tenderness. Also c/o nausea. Denies saddle anesthesia, leg weakness, loss of bowel/bladder control. CT abdomen and pelvis normal, normal appendix.  Review of labs- mild hypokalemia (orally repleted), CBC unremarkable, UA with small ketones and protein. Patient given IV fluids, morphine, zofran, some relief of pain and BP improved. Given flexeril and Lidoderm patch, dc with same with plan to call PCP for  follow up. Suspect msk cause of pain.    [LM]    Clinical Course User Index [LM] Tacy Learn, PA-C      Final Clinical Impressions(s) / ED Diagnoses   Final diagnoses:  Radicular low back pain  Hypokalemia    ED Discharge Orders         Ordered    cyclobenzaprine (FLEXERIL) 10 MG tablet    Note to Pharmacy:  DX Code Needed  PT NEEDS REFILLS PLEASE. [age] YO.   04/18/19 0343    lidocaine (LIDODERM) 5 %  Every 24 hours     04/18/19 0343           Tacy Learn, PA-C 10/16/10 [id number]    Delora Fuel, MD 70/14/10 [phone]

## 2019-04-18 NOTE — ED Notes (Addendum)
Urine culture sent down with UA. 

## 2019-04-18 NOTE — Discharge Instructions (Addendum)
Take Flexeril as prescribed. Apply Lidoderm patch as prescribed as needed for pain. Apply warm compresses for 20-30 minutes at a time. Follow up with your doctor for recheck.

## 2019-04-19 ENCOUNTER — Emergency Department (HOSPITAL_COMMUNITY): Payer: BLUE CROSS/BLUE SHIELD

## 2019-04-19 ENCOUNTER — Encounter (HOSPITAL_COMMUNITY): Payer: Self-pay

## 2019-04-19 ENCOUNTER — Emergency Department (HOSPITAL_COMMUNITY)
Admission: EM | Admit: 2019-04-19 | Discharge: 2019-04-19 | Disposition: A | Discharge status: Home / Self Care | Payer: BLUE CROSS/BLUE SHIELD | Attending: Emergency Medicine | Admitting: Emergency Medicine

## 2019-04-19 ENCOUNTER — Other Ambulatory Visit: Payer: Self-pay

## 2019-04-19 DIAGNOSIS — N189 Chronic kidney disease, unspecified: Secondary | ICD-10-CM | POA: Diagnosis not present

## 2019-04-19 DIAGNOSIS — Z87891 Personal history of nicotine dependence: Secondary | ICD-10-CM | POA: Insufficient documentation

## 2019-04-19 DIAGNOSIS — I129 Hypertensive chronic kidney disease with stage 1 through stage 4 chronic kidney disease, or unspecified chronic kidney disease: Secondary | ICD-10-CM | POA: Diagnosis not present

## 2019-04-19 DIAGNOSIS — M47812 Spondylosis without myelopathy or radiculopathy, cervical region: Secondary | ICD-10-CM | POA: Insufficient documentation

## 2019-04-19 DIAGNOSIS — Z79899 Other long term (current) drug therapy: Secondary | ICD-10-CM | POA: Insufficient documentation

## 2019-04-19 DIAGNOSIS — Z6841 Body Mass Index (BMI) 40.0 and over, adult: Secondary | ICD-10-CM | POA: Insufficient documentation

## 2019-04-19 DIAGNOSIS — Z8709 Personal history of other diseases of the respiratory system: Secondary | ICD-10-CM | POA: Insufficient documentation

## 2019-04-19 DIAGNOSIS — M5412 Radiculopathy, cervical region: Secondary | ICD-10-CM | POA: Diagnosis not present

## 2019-04-19 DIAGNOSIS — G8929 Other chronic pain: Secondary | ICD-10-CM | POA: Insufficient documentation

## 2019-04-19 DIAGNOSIS — M5441 Lumbago with sciatica, right side: Secondary | ICD-10-CM | POA: Diagnosis not present

## 2019-04-19 DIAGNOSIS — E669 Obesity, unspecified: Secondary | ICD-10-CM | POA: Diagnosis not present

## 2019-04-19 DIAGNOSIS — N390 Urinary tract infection, site not specified: Secondary | ICD-10-CM | POA: Diagnosis not present

## 2019-04-19 DIAGNOSIS — M545 Low back pain: Secondary | ICD-10-CM | POA: Diagnosis present

## 2019-04-19 LAB — URINALYSIS, ROUTINE W REFLEX MICROSCOPIC
Bilirubin Urine: NEGATIVE
Glucose, UA: NEGATIVE mg/dL
Hgb urine dipstick: NEGATIVE
Ketones, ur: NEGATIVE mg/dL
Nitrite: NEGATIVE
Protein, ur: 30 mg/dL — AB
Specific Gravity, Urine: 1.031 — ABNORMAL HIGH (ref 1.005–1.030)
pH: 5 (ref 5.0–8.0)

## 2019-04-19 MED ORDER — PREDNISONE 10 MG PO TABS: 40.0000 mg | ORAL_TABLET | Freq: Every day | ORAL | 0 refills | Status: AC

## 2019-04-19 MED ORDER — METHOCARBAMOL 500 MG PO TABS
1000.0000 mg | ORAL_TABLET | Freq: Once | ORAL | Status: AC
  Administered 2019-04-19: 1000 mg via ORAL
  Filled 2019-04-19: qty 2

## 2019-04-19 MED ORDER — PREDNISONE 20 MG PO TABS
60.0000 mg | ORAL_TABLET | Freq: Once | ORAL | Status: AC
  Administered 2019-04-19: 60 mg via ORAL
  Filled 2019-04-19: qty 3

## 2019-04-19 MED ORDER — SULFAMETHOXAZOLE-TRIMETHOPRIM 800-160 MG PO TABS: 1.0000 | ORAL_TABLET | Freq: Two times a day (BID) | ORAL | 0 refills | Status: DC

## 2019-04-19 MED ORDER — HYDROCODONE-ACETAMINOPHEN 5-325 MG PO TABS: 2.0000 | ORAL_TABLET | Freq: Four times a day (QID) | ORAL | 0 refills | Status: AC | PRN

## 2019-04-19 MED ORDER — SULFAMETHOXAZOLE-TRIMETHOPRIM 800-160 MG PO TABS
1.0000 | ORAL_TABLET | Freq: Once | ORAL | Status: AC
  Administered 2019-04-19: 1 via ORAL
  Filled 2019-04-19: qty 1

## 2019-04-19 MED ORDER — HYDROCODONE-ACETAMINOPHEN 5-325 MG PO TABS
2.0000 | ORAL_TABLET | Freq: Once | ORAL | Status: AC
  Administered 2019-04-19: 2 via ORAL
  Filled 2019-04-19: qty 2

## 2019-04-19 MED ORDER — METHOCARBAMOL 500 MG PO TABS: 500.0000 mg | ORAL_TABLET | Freq: Two times a day (BID) | ORAL | 0 refills | Status: DC

## 2019-04-19 NOTE — Discharge Instructions (Signed)
Follow-up with primary care for further work-up and treatment of your pain to include possible physical therapy, injections or orthopedic referral. Thank you for allowing me to care for you today. Please return to the emergency department if you have new or worsening symptoms. Take your medications as instructed.

## 2019-04-19 NOTE — ED Notes (Signed)
Pt ambulated to bathroom independently to provide UA sample at this time.

## 2019-04-19 NOTE — ED Triage Notes (Signed)
Pt c.o sharp lower back pain that started 4 days ago as well as bilateral hand and arm numbness that started last night around 9pm. Pt was seen here last night for the same, given muscle relaxers without relief. Pt a.o, ambulatory in triage

## 2019-04-19 NOTE — ED Notes (Signed)
Patient verbalizes understanding of discharge instructions. Opportunity for questioning and answers were provided. Armband removed by staff, pt discharged from ED.  

## 2019-04-19 NOTE — ED Provider Notes (Signed)
[facility] EMERGENCY DEPARTMENT Provider Note   CSN: [id number] Arrival date & time: 04/19/19  2021    History   Chief Complaint Chief Complaint  Patient presents with  . Back Pain    HPI Victoria Moore is a 43 y.o. female.     Patient is a 43 year old obese female with past medical history of chronic back pain who presents the emergency department for lower back pain, mid back pain, neck pain with radiation into the arms.  Reports that the back pain is been going on for some time but that the neck pain just started today.  Reports that she feels tingling and numbness in her bilateral shoulders into her entire hands and fingertips.  No exacerbating or relieving factors.  Denies any fever, chills, loss of control of bowel or bladder movements, saddle anesthesia.  She has tried some Flexeril for relief that she was given yesterday when she was seen here for her same back pain.  She also have lab work done yesterday which was unremarkable.  She denies any injury or fall.  She reports that she currently does not have a primary care doctor.  She also reports that she wants her urine checked because she is having a little bit of burning with urination.  Denies any vaginal discharge, vaginal bleeding     Past Medical History:  Diagnosis Date  . Allergy   . Anemia   . Anxiety   . Arthritis   . Asthma   . Blood transfusion without reported diagnosis   . Chronic kidney disease   . Common migraine with intractable migraine 10/07/2016  . Depression   . GERD (gastroesophageal reflux disease)   . Hypertension   . Migraine   . Sarcoidosis    personal history of  . Seizures ([place])    history of     Patient Active Problem List   Diagnosis Date Noted  . Leg pain 12/07/2018  . Low libido 11/27/2018  . Prolonged capillary refill time 10/04/2018  . Decreased pedal pulses 10/04/2018  . Spondylosis 09/15/2018  . Chronic low back pain with sciatica 09/15/2018  .  Impaired fasting blood sugar 09/11/2018  . Weight gain 09/11/2018  . Edema 09/11/2018  . Frequency of urination 09/11/2018  . Right leg swelling 08/18/2018  . Mild sleep apnea 08/03/2018  . Hypokalemia 06/28/2018  . Medication side effect 06/22/2018  . Acute low back pain without sciatica 06/22/2018  . Personal history of sarcoidosis 05/26/2018  . Vitamin D deficiency 05/26/2018  . Encounter for health maintenance examination with abnormal findings 05/26/2018  . Depression, major, single episode, moderate ([place]) 05/26/2018  . Urinary tract infection without hematuria 05/26/2018  . Daytime sleepiness 05/26/2018  . Chest wall pain 05/26/2018  . GERD (gastroesophageal reflux disease) 05/26/2018  . Essential hypertension, benign 11/15/2016  . 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 Morbid (severe) obesity due to excess calories ([place]) 10/23/2016  . Cough variant asthma vs UACS  10/23/2016  . Common migraine with intractable migraine 10/07/2016  . Dyspnea 04/15/2016  . Hilar adenopathy 04/15/2016  . Fibroids 03/11/2016  . Anemia 11/12/2014  . Iron deficiency 11/12/2014    Past Surgical History:  Procedure Laterality Date  . ABDOMINAL HYSTERECTOMY    . LAPAROSCOPIC OVARIAN CYSTECTOMY Left 03/11/2016   Procedure: LAPAROSCOPIC OVARIAN CYSTECTOMY;  Surgeon: Eldred Manges, MD;  Location: [facility];  Service: Gynecology;  Laterality: Left;  . LAPAROSCOPIC VAGINAL HYSTERECTOMY WITH SALPINGECTOMY Bilateral 03/11/2016   Procedure: LAPAROSCOPIC ASSISTED  VAGINAL HYSTERECTOMY WITH SALPINGECTOMY;  Surgeon: Eldred Manges, MD;  Location: [facility];  Service: Gynecology;  Laterality: Bilateral;  . TUBAL LIGATION       OB History    Gravida  1   Para      Term      Preterm      AB      Living        SAB      TAB      Ectopic      Multiple      Live Births               Home Medications    Prior to Admission medications   Medication Sig Start Date End Date Taking?  Authorizing Provider  albuterol (PROVENTIL HFA;VENTOLIN HFA) 108 (90 Base) MCG/ACT inhaler Inhale 1-2 puffs into the lungs every 4 (four) hours as needed for wheezing or shortness of breath (cough). 01/06/18   Gildardo Pounds, NP  albuterol (PROVENTIL) (2.5 MG/3ML) 0.083% nebulizer solution Take 3 mLs (2.5 mg total) by nebulization every 6 (six) hours as needed for wheezing or shortness of breath. 12/05/18   Isla Pence, MD  ALPRAZolam Duanne Moron) 1 MG tablet TAKE 1/2 TO 1 TABLET DAILY AS NEEDED FOR ANXIETY 03/01/19   Tysinger, Camelia Eng, PA-C  amphetamine-dextroamphetamine (ADDERALL XR) 30 MG 24 hr capsule Take 30 mg by mouth as needed (adhd).     [provider]  benzonatate (TESSALON) 200 MG capsule Take 1 capsule (200 mg total) by mouth 3 (three) times daily as needed for cough. 12/01/18 12/01/19  Parrett, Fonnie Mu, NP  carvedilol (COREG CR) 20 MG 24 hr capsule TAKE 1 CAPSULE BY MOUTH EVERY DAY 06/30/18   Libby Maw, MD  chlorthalidone (HYGROTON) 25 MG tablet Take 1 tablet (25 mg total) by mouth daily. 09/12/18   Tysinger, Camelia Eng, PA-C  dexlansoprazole (DEXILANT) 60 MG capsule Take 1 capsule (60 mg total) by mouth daily. 09/13/18   Cirigliano, Vito V, DO  doxycycline (VIBRAMYCIN) 100 MG capsule Take 1 capsule (100 mg total) by mouth 2 (two) times daily. 12/05/18   Isla Pence, MD  ferrous sulfate 325 (65 FE) MG EC tablet Take 1 tablet (325 mg total) by mouth daily with breakfast. 08/31/18   Libby Maw, MD  FLUoxetine (PROZAC) 20 MG tablet Take 1 tablet (20 mg total) by mouth daily. 11/01/18   Tysinger, Camelia Eng, PA-C  guaiFENesin (MUCINEX) 600 MG 12 hr tablet Take 1,200 mg by mouth 2 (two) times daily.    [provider]  HYDROcodone-acetaminophen (NORCO/VICODIN) 5-325 MG tablet Take 2 tablets by mouth every 6 (six) hours as needed for up to 3 days. 04/19/19 04/22/19  Madilyn Hook A, PA-C  lidocaine (LIDODERM) 5 % Place 1 patch onto the skin daily. Remove & Discard  patch within 12 hours or as directed by MD 04/18/19   Tacy Learn, PA-C  loratadine (CLARITIN) 10 MG tablet Take 10 mg by mouth daily.    [provider]  methocarbamol (ROBAXIN) 500 MG tablet Take 1 tablet (500 mg total) by mouth 2 (two) times daily. 04/19/19   Alveria Apley, PA-C  mometasone-formoterol (DULERA) 100-5 MCG/ACT AERO Inhale 2 puffs into the lungs 2 (two) times daily. 11/24/17   Argentina Donovan, PA-C  nitrofurantoin, macrocrystal-monohydrate, (MACROBID) 100 MG capsule Take 1 capsule (100 mg total) by mouth 2 (two) times daily. 11/27/18   Tysinger, Camelia Eng, PA-C  Potassium Chloride ER 20  MEQ TBCR Take one daily. Patient taking differently: Take 20 mEq by mouth daily. Take one daily. 09/12/18   Tysinger, Camelia Eng, PA-C  predniSONE (DELTASONE) 10 MG tablet Take 4 tablets (40 mg total) by mouth daily for 5 days. 04/19/19 04/24/19  Alveria Apley, PA-C  pregabalin (LYRICA) 50 MG capsule Take 1 capsule (50 mg total) by mouth 3 (three) times daily. 09/15/18   Tysinger, Camelia Eng, PA-C  spironolactone (ALDACTONE) 25 MG tablet Take 1 tablet (25 mg total) by mouth daily. 10/04/18   Tysinger, Camelia Eng, PA-C  sulfamethoxazole-trimethoprim (BACTRIM DS) 800-160 MG tablet Take 1 tablet by mouth 2 (two) times daily for 5 days. 04/19/19 04/24/19  Alveria Apley, PA-C    Family History Family History  Adopted: Yes  Problem Relation Age of Onset  . Other Son        Growing pains  . Asthma Mother   . Heart Problems Mother   . Migraines Mother   . Diabetes Father   . Peptic Ulcer Father   . Heart Problems Father     Social History Social History   Tobacco Use  . Smoking status: Former Smoker    Packs/day: 0.25    Years: 17.00    Pack years: 4.25    Types: Cigarettes    Last attempt to quit: 03/02/2013    Years since quitting: 6.1  . Smokeless tobacco: Never Used  Substance Use Topics  . Alcohol use: No    Alcohol/week: 0.0 standard drinks  . Drug use: No    Types: Marijuana     Comment: former - 6 yrs ago     Allergies   Aspirin   Review of Systems Review of Systems  Constitutional: Negative for chills and fever.  HENT: Negative for ear pain and sore throat.   Eyes: Negative for pain and visual disturbance.  Respiratory: Negative for cough and shortness of breath.   Cardiovascular: Negative for chest pain and palpitations.  Gastrointestinal: Negative for abdominal pain and vomiting.  Genitourinary: Negative for dysuria and hematuria.  Musculoskeletal: Positive for arthralgias, back pain, myalgias and neck pain. Negative for gait problem, joint swelling and neck stiffness.  Skin: Negative for color change and rash.  Neurological: Positive for numbness. Negative for dizziness, tremors, seizures, syncope, facial asymmetry, speech difficulty, weakness, light-headedness and headaches.  All other systems reviewed and are negative.    Physical Exam Updated Vital Signs BP (!) 129/94 (BP Location: Right Arm)   Pulse 75   Temp 98.2 F (36.8 C) (Oral)   Resp 16   Ht 5\' 1"  (1.549 m)   Wt (!) 141.5 kg   LMP 02/19/2016   SpO2 100%   BMI 58.95 kg/m   Physical Exam Vitals signs and nursing note reviewed.  Constitutional:      General: She is not in acute distress.    Appearance: She is well-developed. She is obese.  HENT:     Head: Normocephalic and atraumatic.  Eyes:     Conjunctiva/sclera: Conjunctivae normal.  Neck:     Musculoskeletal: Neck supple.  Cardiovascular:     Rate and Rhythm: Normal rate and regular rhythm.     Heart sounds: No murmur.  Pulmonary:     Effort: Pulmonary effort is normal. No respiratory distress.     Breath sounds: Normal breath sounds.  Abdominal:     Palpations: Abdomen is soft.     Tenderness: There is no abdominal tenderness.  Skin:    General: Skin is  warm and dry.  Neurological:     General: No focal deficit present.     Mental Status: She is alert.     Sensory: No sensory deficit.     Motor: No weakness.      Coordination: Coordination normal.     Gait: Gait normal.     Comments: 1+ bilateral lower extremity reflexes.  Normal upper and lower extremity strength and sensation.  No saddle anesthesia  Psychiatric:        Mood and Affect: Mood normal.      ED Treatments / Results  Labs (all labs ordered are listed, but only abnormal results are displayed) Labs Reviewed  URINALYSIS, ROUTINE W REFLEX MICROSCOPIC - Abnormal; Notable for the following components:      Result Value   Color, Urine AMBER (*)    APPearance CLOUDY (*)    Specific Gravity, Urine 1.031 (*)    Protein, ur 30 (*)    Leukocytes,Ua LARGE (*)    Bacteria, UA FEW (*)    All other components within normal limits  URINE CULTURE    EKG None  Radiology Dg Cervical Spine 2-3 Views  Result Date: 04/19/2019 CLINICAL DATA:  Back pain for 4 days. Bilateral hand and arm numbness since last night. EXAM: CERVICAL SPINE - 2-3 VIEW COMPARISON:  Cervical spine radiographs 10/27/2012. FINDINGS: The prevertebral soft tissues are normal. The alignment is anatomic through C7. The cervicothoracic junction is not well visualized in the lateral projection. It is partially imaged on the thoracic spine series and appears grossly normal. There is no evidence of acute fracture or traumatic subluxation. The C1-2 articulation appears normal in the AP projection. Mildly progressive disc space narrowing and uncinate spurring at C5-6. IMPRESSION: No acute cervical spine findings. Mildly progressive spondylosis at C5-6. Electronically Signed   By: Richardean Sale M.D.   On: 04/19/2019 21:35   Dg Thoracic Spine 2 View  Result Date: 04/19/2019 CLINICAL DATA:  Hervey Ard back pain for 4 days. No reported acute injury. EXAM: THORACIC SPINE 2 VIEWS COMPARISON:  Chest radiographs 03/07/2019. FINDINGS: There are 12 rib-bearing thoracic type vertebral bodies. The alignment is normal. No evidence of acute fracture, paraspinal hematoma or widening of the interpedicular  distance. There is mild intervertebral spurring throughout the thoracic spine, similar to prior radiographs. IMPRESSION: No acute osseous findings.  Mild thoracic spondylosis. Electronically Signed   By: Richardean Sale M.D.   On: 04/19/2019 21:33   Dg Lumbar Spine Complete  Result Date: 04/19/2019 CLINICAL DATA:  Hervey Ard back pain for 4 days. No reported acute injury. EXAM: LUMBAR SPINE - COMPLETE 4+ VIEW COMPARISON:  Abdominopelvic CT 04/18/2019. Abdominal radiographs 03/07/2019. FINDINGS: There are 5 lumbar type vertebral bodies. The alignment is normal. There is stable mild disc space narrowing at L5-S1 and mild facet hypertrophy inferiorly. No evidence of acute fracture or pars defect. IMPRESSION: No acute findings.  Mild lumbar spondylosis. Electronically Signed   By: Richardean Sale M.D.   On: 04/19/2019 21:32    Procedures Procedures (including critical care time)  Medications Ordered in ED Medications  HYDROcodone-acetaminophen (NORCO/VICODIN) 5-325 MG per tablet 2 tablet (2 tablets Oral Given 04/19/19 2058)  predniSONE (DELTASONE) tablet 60 mg (60 mg Oral Given 04/19/19 2058)  methocarbamol (ROBAXIN) tablet 1,000 mg (1,000 mg Oral Given 04/19/19 2058)  sulfamethoxazole-trimethoprim (BACTRIM DS) 800-160 MG per tablet 1 tablet (1 tablet Oral Given 04/19/19 2241)     Initial Impression / Assessment and Plan / ED Course  I have reviewed the  triage vital signs and the nursing notes.  Pertinent labs & imaging results that were available during my care of the patient were reviewed by me and considered in my medical decision making (see chart for details).  Clinical Course as of Apr 19 704  Thu Apr 19, 2019  2220 I think patient's pain is related to her chronic back pain as well as may be some new onset neck and thoracic pain.  She has some arthritis throughout her entire spine which is probably causing some radicular pain as well.  No concerning findings on her physical exam or x-ray.  She  has symptoms of UTI and wanted her urine checked.  There are some few bacteria, white blood cells, leukocytes.  I will treat her with some Bactrim. Patient was evaluated for back pain today. Patient has no concerning symptoms or physical exam findings including no fever, no loss of control of bowel or bladder, no urinary retention, no saddle anesthesia, no leg weakness and no pain radiation into the legs. She was given medication to treat her symptoms and advised to f/u with PMD for further workup including possible PT, medication change, further imaging, etc. She was advised on all concerning symptoms above and to return to the ED if any of them arise.      I discussed treatment options for the patients pain to include narcotic and non-narcotic medications. I discussed the risks of narcotic medications in full detail to include overdose and addiction. Patient verbalized full understanding of risks of narcotic medication but still insisted to take rx for narcotic pain medication due to the severity of their pain.  Prior to providing a prescription for a controlled substance, I independently reviewed the patient's recent prescription history on the [facility]. The patient had no recent or regular prescriptions and was deemed appropriate for a brief, less than 3 day prescription of narcotic for acute analgesia.     [KM]    Clinical Course User Index [KM] Alveria Apley, PA-C       Based on review of vitals, medical screening exam, lab work and/or imaging, there does not appear to be an acute, emergent etiology for the patient's symptoms. Counseled pt on good return precautions and encouraged both PCP and ED follow-up as needed.  Prior to discharge, I also discussed incidental imaging findings with patient in detail and advised appropriate, recommended follow-up in detail.  Clinical Impression: 1. Chronic bilateral low back pain with right-sided  sciatica   2. Cervical arthritis   3. Cervical radiculitis   4. Acute UTI     Disposition: Discharge  Prior to providing a prescription for a controlled substance, I independently reviewed the patient's recent prescription history on the [facility]. The patient had no recent or regular prescriptions and was deemed appropriate for a brief, less than 3 day prescription of narcotic for acute analgesia.  This note was prepared with assistance of Systems analyst. Occasional wrong-word or sound-a-like substitutions may have occurred due to the inherent limitations of voice recognition software.   Final Clinical Impressions(s) / ED Diagnoses   Final diagnoses:  Chronic bilateral low back pain with right-sided sciatica  Cervical arthritis  Cervical radiculitis  Acute UTI    ED Discharge Orders         Ordered    predniSONE (DELTASONE) 10 MG tablet  Daily     04/19/19 2220    HYDROcodone-acetaminophen (NORCO/VICODIN) 5-325 MG tablet  Every 6 hours PRN     04/19/19 2220    methocarbamol (ROBAXIN) 500 MG tablet  2 times daily     04/19/19 2220    sulfamethoxazole-trimethoprim (BACTRIM DS) 800-160 MG tablet  2 times daily     04/19/19 2220           Kristine Royal 04/20/19 [id number]    Virgel Manifold, MD 04/20/19 2022

## 2019-04-21 LAB — URINE CULTURE: Culture: NO GROWTH

## 2019-04-23 ENCOUNTER — Encounter (HOSPITAL_COMMUNITY): Payer: Self-pay

## 2019-04-23 ENCOUNTER — Ambulatory Visit (HOSPITAL_COMMUNITY)
Admission: EM | Admit: 2019-04-23 | Discharge: 2019-04-23 | Disposition: A | Discharge status: Home / Self Care | Payer: BLUE CROSS/BLUE SHIELD | Attending: Family Medicine | Admitting: Family Medicine

## 2019-04-23 ENCOUNTER — Other Ambulatory Visit: Payer: Self-pay

## 2019-04-23 DIAGNOSIS — G8929 Other chronic pain: Secondary | ICD-10-CM

## 2019-04-23 DIAGNOSIS — M545 Low back pain: Secondary | ICD-10-CM | POA: Diagnosis not present

## 2019-04-23 MED ORDER — HYDROCODONE-ACETAMINOPHEN 7.5-325 MG PO TABS: 1.0000 | ORAL_TABLET | Freq: Four times a day (QID) | ORAL | 0 refills | Status: DC | PRN

## 2019-04-23 MED ORDER — METHOCARBAMOL 500 MG PO TABS: 500.0000 mg | ORAL_TABLET | Freq: Two times a day (BID) | ORAL | 0 refills | Status: DC

## 2019-04-23 NOTE — Discharge Instructions (Addendum)
Activity as tolerated.  If you stay more active, your car will be quicker.  Avoid heavy lifting or bending. Finish the prednisone as directed Take Robaxin as needed as muscle relaxer Take hydrocodone as needed for severe pain.  Take with food.  Do not drive while on hydrocodone.  This will not be refilled. It is important to keep your appointment with the PCP on March 1

## 2019-04-23 NOTE — ED Triage Notes (Signed)
Patient presents to Urgent Care with complaints of lower back pain since several days ago. Patient states she was seen in the ED recently and they told her she had arthritis, now her work is requesting paperwork be filled out describing the pt's restrictions. Pt did bring paperwork with her (6 pages worth)to be filled out by the provider, does not have a PCP until the first of May.

## 2019-04-24 NOTE — ED Provider Notes (Signed)
[place]    CSN: [id number] Arrival date & time: 04/23/19  1639     History   Chief Complaint Chief Complaint  Patient presents with  . Letter for School/Work  . Back Pain    HPI Victoria Moore is a 43 y.o. female.   HPI  Patient was seen in the emergency room for low back pain.  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  She is scheduled to see a primary care doctor in the next couple of days on March 1 when her insurance kicks in.  She does not feel like she can continue working with her back pain.  She is here hoping to get a work note to leave her off until she can establish with her primary care and get treatment for her ongoing back condition.  She has central low back pain.  No radiation.  It is chronic.  Is been flared up recently.  Please refer to emergency room note of a couple days ago.  Past Medical History:  Diagnosis Date  . Allergy   . Anemia   . Anxiety   . Arthritis   . Asthma   . Blood transfusion without reported diagnosis   . Chronic kidney disease   . Common migraine with intractable migraine 10/07/2016  . Depression   . GERD (gastroesophageal reflux disease)   . Hypertension   . Migraine   . Sarcoidosis    personal history of  . Seizures ([place])    history of     Patient Active Problem List   Diagnosis Date Noted  . Leg pain 12/07/2018  . Low libido 11/27/2018  . Prolonged capillary refill time 10/04/2018  . Decreased pedal pulses 10/04/2018  . Spondylosis 09/15/2018  . Chronic low back pain with sciatica 09/15/2018  . Impaired fasting blood sugar 09/11/2018  . Weight gain 09/11/2018  . Edema 09/11/2018  . Frequency of urination 09/11/2018  . Right leg swelling 08/18/2018  . Mild sleep apnea 08/03/2018  . Hypokalemia 06/28/2018  . Medication side effect 06/22/2018  . Acute low back pain without sciatica 06/22/2018  . Personal history of sarcoidosis 05/26/2018  . Vitamin D  deficiency 05/26/2018  . Encounter for health maintenance examination with abnormal findings 05/26/2018  . Depression, major, single episode, moderate ([place]) 05/26/2018  . Urinary tract infection without hematuria 05/26/2018  . Daytime sleepiness 05/26/2018  . Chest wall pain 05/26/2018  . GERD (gastroesophageal reflux disease) 05/26/2018  . Essential hypertension, benign 11/15/2016  . 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 Morbid (severe) obesity due to excess calories ([place]) 10/23/2016  . Cough variant asthma vs UACS  10/23/2016  . Common migraine with intractable migraine 10/07/2016  . Dyspnea 04/15/2016  . Hilar adenopathy 04/15/2016  . Fibroids 03/11/2016  . Anemia 11/12/2014  . Iron deficiency 11/12/2014    Past Surgical History:  Procedure Laterality Date  . ABDOMINAL HYSTERECTOMY    . LAPAROSCOPIC OVARIAN CYSTECTOMY Left 03/11/2016   Procedure: LAPAROSCOPIC OVARIAN CYSTECTOMY;  Surgeon: Eldred Manges, MD;  Location: [facility];  Service: Gynecology;  Laterality: Left;  . LAPAROSCOPIC VAGINAL HYSTERECTOMY WITH SALPINGECTOMY Bilateral 03/11/2016   Procedure: LAPAROSCOPIC ASSISTED VAGINAL HYSTERECTOMY WITH SALPINGECTOMY;  Surgeon: Eldred Manges, MD;  Location: [facility];  Service: Gynecology;  Laterality: Bilateral;  . TUBAL LIGATION      OB History    Gravida  1   Para      Term  Preterm      AB      Living        SAB      TAB      Ectopic      Multiple      Live Births               Home Medications    Prior to Admission medications   Medication Sig Start Date End Date Taking? Authorizing Provider  albuterol (PROVENTIL HFA;VENTOLIN HFA) 108 (90 Base) MCG/ACT inhaler Inhale 1-2 puffs into the lungs every 4 (four) hours as needed for wheezing or shortness of breath (cough). 01/06/18   Gildardo Pounds, NP  albuterol (PROVENTIL) (2.5 MG/3ML) 0.083% nebulizer solution Take 3 mLs (2.5 mg total) by nebulization every 6 (six) hours as needed for  wheezing or shortness of breath. 12/05/18   Isla Pence, MD  ALPRAZolam Duanne Moron) 1 MG tablet TAKE 1/2 TO 1 TABLET DAILY AS NEEDED FOR ANXIETY 03/01/19   Tysinger, Camelia Eng, PA-C  amphetamine-dextroamphetamine (ADDERALL XR) 30 MG 24 hr capsule Take 30 mg by mouth as needed (adhd).     [provider]  benzonatate (TESSALON) 200 MG capsule Take 1 capsule (200 mg total) by mouth 3 (three) times daily as needed for cough. 12/01/18 12/01/19  Parrett, Fonnie Mu, NP  carvedilol (COREG CR) 20 MG 24 hr capsule TAKE 1 CAPSULE BY MOUTH EVERY DAY 06/30/18   Libby Maw, MD  chlorthalidone (HYGROTON) 25 MG tablet Take 1 tablet (25 mg total) by mouth daily. 09/12/18   Tysinger, Camelia Eng, PA-C  dexlansoprazole (DEXILANT) 60 MG capsule Take 1 capsule (60 mg total) by mouth daily. 09/13/18   Cirigliano, Vito V, DO  ferrous sulfate 325 (65 FE) MG EC tablet Take 1 tablet (325 mg total) by mouth daily with breakfast. 08/31/18   Libby Maw, MD  FLUoxetine (PROZAC) 20 MG tablet Take 1 tablet (20 mg total) by mouth daily. 11/01/18   Tysinger, Camelia Eng, PA-C  guaiFENesin (MUCINEX) 600 MG 12 hr tablet Take 1,200 mg by mouth 2 (two) times daily.    [provider]  HYDROcodone-acetaminophen (NORCO) 7.5-325 MG tablet Take 1 tablet by mouth every 6 (six) hours as needed for moderate pain. 04/23/19   Raylene Everts, MD  lidocaine (LIDODERM) 5 % Place 1 patch onto the skin daily. Remove & Discard patch within 12 hours or as directed by MD 04/18/19   Tacy Learn, PA-C  loratadine (CLARITIN) 10 MG tablet Take 10 mg by mouth daily.    [provider]  methocarbamol (ROBAXIN) 500 MG tablet Take 1 tablet (500 mg total) by mouth 2 (two) times daily. 04/23/19   Raylene Everts, MD  mometasone-formoterol [facility]) 100-5 MCG/ACT AERO Inhale 2 puffs into the lungs 2 (two) times daily. 11/24/17   Argentina Donovan, PA-C  nitrofurantoin, macrocrystal-monohydrate, (MACROBID) 100 MG capsule Take 1  capsule (100 mg total) by mouth 2 (two) times daily. 11/27/18   Tysinger, Camelia Eng, PA-C  Potassium Chloride ER 20 MEQ TBCR Take one daily. Patient taking differently: Take 20 mEq by mouth daily. Take one daily. 09/12/18   Tysinger, Camelia Eng, PA-C  predniSONE (DELTASONE) 10 MG tablet Take 4 tablets (40 mg total) by mouth daily for 5 days. 04/19/19 04/24/19  Alveria Apley, PA-C  pregabalin (LYRICA) 50 MG capsule Take 1 capsule (50 mg total) by mouth 3 (three) times daily. 09/15/18   Tysinger, Camelia Eng, PA-C  spironolactone (ALDACTONE) 25  MG tablet Take 1 tablet (25 mg total) by mouth daily. 10/04/18   Tysinger, Camelia Eng, PA-C    Family History Family History  Adopted: Yes  Problem Relation Age of Onset  . Other Son        Growing pains  . Asthma Mother   . Heart Problems Mother   . Migraines Mother   . Diabetes Father   . Peptic Ulcer Father   . Heart Problems Father     Social History Social History   Tobacco Use  . Smoking status: Former Smoker    Packs/day: 0.25    Years: 17.00    Pack years: 4.25    Types: Cigarettes    Last attempt to quit: 03/02/2013    Years since quitting: 6.1  . Smokeless tobacco: Never Used  Substance Use Topics  . Alcohol use: No    Alcohol/week: 0.0 standard drinks  . Drug use: No    Types: Marijuana    Comment: former - 6 yrs ago     Allergies   Aspirin   Review of Systems Review of Systems  Constitutional: Negative for chills and fever.  HENT: Negative for ear pain and sore throat.   Eyes: Negative for pain and visual disturbance.  Respiratory: Negative for cough and shortness of breath.   Cardiovascular: Negative for chest pain and palpitations.  Gastrointestinal: Negative for abdominal pain and vomiting.  Genitourinary: Negative for dysuria and hematuria.  Musculoskeletal: Positive for back pain. Negative for arthralgias.  Skin: Negative for color change and rash.  Neurological: Negative for seizures and syncope.  All other systems  reviewed and are negative.    Physical Exam Triage Vital Signs ED Triage Vitals  Enc Vitals Group     BP 04/23/19 1700 (!) 147/88     Pulse Rate 04/23/19 1700 62     Resp 04/23/19 1700 17     Temp 04/23/19 1700 98.5 F (36.9 C)     Temp Source 04/23/19 1700 Oral     SpO2 04/23/19 1700 100 %     Weight --      Height --      Head Circumference --      Peak Flow --      Pain Score 04/23/19 1659 4     Pain Loc --      Pain Edu? --      Excl. in [place]? --    No data found.  Updated Vital Signs BP (!) 147/88 (BP Location: Right Arm)   Pulse 62   Temp 98.5 F (36.9 C) (Oral)   Resp 17   LMP 02/19/2016   SpO2 100%      Physical Exam Constitutional:      General: She is not in acute distress.    Appearance: She is well-developed.  HENT:     Head: Normocephalic and atraumatic.  Eyes:     Conjunctiva/sclera: Conjunctivae normal.     Pupils: Pupils are equal, round, and reactive to light.  Neck:     Musculoskeletal: Normal range of motion.  Cardiovascular:     Rate and Rhythm: Normal rate.  Pulmonary:     Effort: Pulmonary effort is normal. No respiratory distress.  Abdominal:     General: There is no distension.     Palpations: Abdomen is soft.  Musculoskeletal: Normal range of motion.     Comments: Tenderness in the central low back.  Mild tenderness in the muscles without palpable spasm.  Strength sensation range of  motion and reflexes are normal in both lower extremities.  No tenderness through the thoracic or cervical regions  Skin:    General: Skin is warm and dry.  Neurological:     Mental Status: She is alert.      UC Treatments / Results  Labs (all labs ordered are listed, but only abnormal results are displayed) Labs Reviewed - No data to display  EKG None  Radiology No results found.  Procedures Procedures (including critical care time)  Medications Ordered in UC Medications - No data to display  Initial Impression / Assessment and Plan /  UC Course  I have reviewed the triage vital signs and the nursing notes.  Pertinent labs & imaging results that were available during my care of the patient were reviewed by me and considered in my medical decision making (see chart for details).     Final Clinical Impressions(s) / UC Diagnoses   Final diagnoses:  Chronic bilateral low back pain without sciatica     Discharge Instructions     Activity as tolerated.  If you stay more active, your car will be quicker.  Avoid heavy lifting or bending. Finish the prednisone as directed Take Robaxin as needed as muscle relaxer Take hydrocodone as needed for severe pain.  Take with food.  Do not drive while on hydrocodone.  This will not be refilled. It is important to keep your appointment with the PCP on March 1   ED Prescriptions    Medication Sig Dispense Auth. Provider   methocarbamol (ROBAXIN) 500 MG tablet Take 1 tablet (500 mg total) by mouth 2 (two) times daily. 20 tablet Raylene Everts, MD   HYDROcodone-acetaminophen [facility]) 7.5-325 MG tablet Take 1 tablet by mouth every 6 (six) hours as needed for moderate pain. 15 tablet Raylene Everts, MD     Controlled Substance Prescriptions [place] Controlled Substance Registry consulted? Yes, I have consulted the [place] Controlled Substances Registry for this patient, and feel the risk/benefit ratio today is favorable for proceeding with this prescription for a controlled substance.   Raylene Everts, MD 04/24/19 [phone]

## 2019-06-25 ENCOUNTER — Encounter (HOSPITAL_COMMUNITY): Payer: Self-pay | Admitting: Emergency Medicine

## 2019-06-25 ENCOUNTER — Emergency Department (HOSPITAL_COMMUNITY): Payer: BLUE CROSS/BLUE SHIELD

## 2019-06-25 ENCOUNTER — Emergency Department (HOSPITAL_COMMUNITY)
Admission: EM | Admit: 2019-06-25 | Discharge: 2019-06-25 | Disposition: A | Discharge status: Home / Self Care | Payer: BLUE CROSS/BLUE SHIELD | Attending: Emergency Medicine | Admitting: Emergency Medicine

## 2019-06-25 ENCOUNTER — Other Ambulatory Visit: Payer: Self-pay

## 2019-06-25 DIAGNOSIS — W010XXA Fall on same level from slipping, tripping and stumbling without subsequent striking against object, initial encounter: Secondary | ICD-10-CM | POA: Insufficient documentation

## 2019-06-25 DIAGNOSIS — Y939 Activity, unspecified: Secondary | ICD-10-CM | POA: Diagnosis not present

## 2019-06-25 DIAGNOSIS — I129 Hypertensive chronic kidney disease with stage 1 through stage 4 chronic kidney disease, or unspecified chronic kidney disease: Secondary | ICD-10-CM | POA: Diagnosis not present

## 2019-06-25 DIAGNOSIS — J45909 Unspecified asthma, uncomplicated: Secondary | ICD-10-CM | POA: Insufficient documentation

## 2019-06-25 DIAGNOSIS — G43809 Other migraine, not intractable, without status migrainosus: Secondary | ICD-10-CM

## 2019-06-25 DIAGNOSIS — W19XXXA Unspecified fall, initial encounter: Secondary | ICD-10-CM

## 2019-06-25 DIAGNOSIS — Y999 Unspecified external cause status: Secondary | ICD-10-CM | POA: Diagnosis not present

## 2019-06-25 DIAGNOSIS — Z79899 Other long term (current) drug therapy: Secondary | ICD-10-CM | POA: Insufficient documentation

## 2019-06-25 DIAGNOSIS — N189 Chronic kidney disease, unspecified: Secondary | ICD-10-CM | POA: Diagnosis not present

## 2019-06-25 DIAGNOSIS — Z87891 Personal history of nicotine dependence: Secondary | ICD-10-CM | POA: Insufficient documentation

## 2019-06-25 DIAGNOSIS — Y929 Unspecified place or not applicable: Secondary | ICD-10-CM | POA: Insufficient documentation

## 2019-06-25 DIAGNOSIS — R51 Headache: Secondary | ICD-10-CM | POA: Diagnosis present

## 2019-06-25 DIAGNOSIS — M25571 Pain in right ankle and joints of right foot: Secondary | ICD-10-CM | POA: Diagnosis not present

## 2019-06-25 LAB — CBC WITH DIFFERENTIAL/PLATELET
Abs Immature Granulocytes: 0.02 10*3/uL (ref 0.00–0.07)
Basophils Absolute: 0 10*3/uL (ref 0.0–0.1)
Basophils Relative: 1 %
Eosinophils Absolute: 0.2 10*3/uL (ref 0.0–0.5)
Eosinophils Relative: 4 %
HCT: 36.8 % (ref 36.0–46.0)
Hemoglobin: 11.5 g/dL — ABNORMAL LOW (ref 12.0–15.0)
Immature Granulocytes: 1 %
Lymphocytes Relative: 30 %
Lymphs Abs: 1.4 10*3/uL (ref 0.7–4.0)
MCH: 27.6 pg (ref 26.0–34.0)
MCHC: 31.3 g/dL (ref 30.0–36.0)
MCV: 88.5 fL (ref 80.0–100.0)
Monocytes Absolute: 0.4 10*3/uL (ref 0.1–1.0)
Monocytes Relative: 8 %
Neutro Abs: 2.5 10*3/uL (ref 1.7–7.7)
Neutrophils Relative %: 56 %
Platelets: 199 10*3/uL (ref 150–400)
RBC: 4.16 MIL/uL (ref 3.87–5.11)
RDW: 14.1 % (ref 11.5–15.5)
WBC: 4.4 10*3/uL (ref 4.0–10.5)
nRBC: 0 % (ref 0.0–0.2)

## 2019-06-25 LAB — COMPREHENSIVE METABOLIC PANEL
ALT: 12 U/L (ref 0–44)
AST: 17 U/L (ref 15–41)
Albumin: 3.8 g/dL (ref 3.5–5.0)
Alkaline Phosphatase: 50 U/L (ref 38–126)
Anion gap: 9 (ref 5–15)
BUN: 14 mg/dL (ref 6–20)
CO2: 26 mmol/L (ref 22–32)
Calcium: 8.8 mg/dL — ABNORMAL LOW (ref 8.9–10.3)
Chloride: 106 mmol/L (ref 98–111)
Creatinine, Ser: 1.09 mg/dL — ABNORMAL HIGH (ref 0.44–1.00)
GFR calc Af Amer: >60 mL/min (ref >60)
GFR calc non Af Amer: >60 mL/min (ref >60)
Glucose, Bld: 68 mg/dL — ABNORMAL LOW (ref 70–99)
Potassium: 3.7 mmol/L (ref 3.5–5.1)
Sodium: 141 mmol/L (ref 135–145)
Total Bilirubin: 0.2 mg/dL — ABNORMAL LOW (ref 0.3–1.2)
Total Protein: 7 g/dL (ref 6.5–8.1)

## 2019-06-25 MED ORDER — ONDANSETRON HCL 4 MG/2ML IJ SOLN
4.0000 mg | Freq: Once | INTRAMUSCULAR | Status: AC
  Administered 2019-06-25: 4 mg via INTRAVENOUS
  Filled 2019-06-25: qty 2

## 2019-06-25 MED ORDER — ACETAMINOPHEN 500 MG PO TABS
1000.0000 mg | ORAL_TABLET | Freq: Once | ORAL | Status: DC
  Filled 2019-06-25: qty 2

## 2019-06-25 MED ORDER — PROCHLORPERAZINE EDISYLATE 10 MG/2ML IJ SOLN
10.0000 mg | Freq: Once | INTRAMUSCULAR | Status: DC
  Filled 2019-06-25: qty 2

## 2019-06-25 MED ORDER — SODIUM CHLORIDE 0.9 % IV BOLUS
1000.0000 mL | Freq: Once | INTRAVENOUS | Status: AC
  Administered 2019-06-25: 1000 mL via INTRAVENOUS

## 2019-06-25 MED ORDER — SUMATRIPTAN SUCCINATE 6 MG/0.5ML ~~LOC~~ SOLN
6.0000 mg | Freq: Once | SUBCUTANEOUS | Status: AC
  Administered 2019-06-25: 6 mg via SUBCUTANEOUS
  Filled 2019-06-25: qty 0.5

## 2019-06-25 MED ORDER — DIPHENHYDRAMINE HCL 50 MG/ML IJ SOLN
25.0000 mg | Freq: Once | INTRAMUSCULAR | Status: DC
  Filled 2019-06-25: qty 1

## 2019-06-25 NOTE — Discharge Instructions (Signed)
Follow up with PCP for reevaluation  Return to the ED if you continue to have new worsening symptoms.

## 2019-06-25 NOTE — ED Notes (Signed)
Britni, PA aware pt refused medications ordered.

## 2019-06-25 NOTE — ED Notes (Signed)
Bed: WTR5 Expected date:  Expected time:  Means of arrival:  Comments: 

## 2019-06-25 NOTE — ED Triage Notes (Signed)
Headache x 3-4 days, dizzy started today. Fell at work and hurt right ankle.

## 2019-06-25 NOTE — ED Notes (Signed)
IV team bedside. 

## 2019-06-25 NOTE — ED Provider Notes (Signed)
[facility] DEPT Provider Note   CSN: [id number] Arrival date & time: 06/25/19  0017   History   Chief Complaint Chief Complaint  Patient presents with  . Headache  . Dizziness  . Fall  . Ankle Pain    right    HPI Victoria Moore is a 43 y.o. female has medical history significant for migraines, anemia, anxiety, CKD, sarcoid, seizures who presents for evaluation of headache.  Patient states she has had a headache for days.  Patient states this feels like her typical migraine.  Headache started gradually.  Denies sudden onset thunderclap headache.  States she has been taking Tylenol, ibuprofen as well as Excedrin for headache without relief.  Patient states she has photophobia and phonophobia.  Has had some mild nausea and dizziness.  Patient states she was at work today and she also slipped on a puddle of water and inverted her right ankle.  She was able to ambulate after this.  Patient denies fever, chills, slurred speech, unilateral weakness, dysphasia, phonation changes, dysphasia, chest pain, shortness of breath, abdominal pain, diarrhea, dysuria, decreased range of motion of extremities, numbness or ting in her extremities.  Patient states she did not fall or hit her head when she slipped.  She denies LOC.  Denies anticoagulation.  Her current pain a 9/10.  Pain located to her right occipital region.  Denies radiation of pain.  Denies pain to her neck or neck stiffness.  History obtained from patient and past medical records.  No interpreter was used.     HPI  Past Medical History:  Diagnosis Date  . Allergy   . Anemia   . Anxiety   . Arthritis   . Asthma   . Blood transfusion without reported diagnosis   . Chronic kidney disease   . Common migraine with intractable migraine 10/07/2016  . Depression   . GERD (gastroesophageal reflux disease)   . Hypertension   . Migraine   . Sarcoidosis    personal history of  . Seizures ([place])    history  of     Patient Active Problem List   Diagnosis Date Noted  . Leg pain 12/07/2018  . Low libido 11/27/2018  . Prolonged capillary refill time 10/04/2018  . Decreased pedal pulses 10/04/2018  . Spondylosis 09/15/2018  . Chronic low back pain with sciatica 09/15/2018  . Impaired fasting blood sugar 09/11/2018  . Weight gain 09/11/2018  . Edema 09/11/2018  . Frequency of urination 09/11/2018  . Right leg swelling 08/18/2018  . Mild sleep apnea 08/03/2018  . Hypokalemia 06/28/2018  . Medication side effect 06/22/2018  . Acute low back pain without sciatica 06/22/2018  . Personal history of sarcoidosis 05/26/2018  . Vitamin D deficiency 05/26/2018  . Encounter for health maintenance examination with abnormal findings 05/26/2018  . Depression, major, single episode, moderate ([place]) 05/26/2018  . Urinary tract infection without hematuria 05/26/2018  . Daytime sleepiness 05/26/2018  . Chest wall pain 05/26/2018  . GERD (gastroesophageal reflux disease) 05/26/2018  . Essential hypertension, benign 11/15/2016  . 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 Morbid (severe) obesity due to excess calories ([place]) 10/23/2016  . Cough variant asthma vs UACS  10/23/2016  . Common migraine with intractable migraine 10/07/2016  . Dyspnea 04/15/2016  . Hilar adenopathy 04/15/2016  . Fibroids 03/11/2016  . Anemia 11/12/2014  . Iron deficiency 11/12/2014    Past Surgical History:  Procedure Laterality Date  . ABDOMINAL HYSTERECTOMY    . LAPAROSCOPIC OVARIAN  CYSTECTOMY Left 03/11/2016   Procedure: LAPAROSCOPIC OVARIAN CYSTECTOMY;  Surgeon: Eldred Manges, MD;  Location: [facility];  Service: Gynecology;  Laterality: Left;  . LAPAROSCOPIC VAGINAL HYSTERECTOMY WITH SALPINGECTOMY Bilateral 03/11/2016   Procedure: LAPAROSCOPIC ASSISTED VAGINAL HYSTERECTOMY WITH SALPINGECTOMY;  Surgeon: Eldred Manges, MD;  Location: [facility];  Service: Gynecology;  Laterality: Bilateral;  . TUBAL LIGATION       OB  History    Gravida  1   Para      Term      Preterm      AB      Living        SAB      TAB      Ectopic      Multiple      Live Births               Home Medications    Prior to Admission medications   Medication Sig Start Date End Date Taking? Authorizing Provider  albuterol (PROVENTIL HFA;VENTOLIN HFA) 108 (90 Base) MCG/ACT inhaler Inhale 1-2 puffs into the lungs every 4 (four) hours as needed for wheezing or shortness of breath (cough). 01/06/18   Gildardo Pounds, NP  albuterol (PROVENTIL) (2.5 MG/3ML) 0.083% nebulizer solution Take 3 mLs (2.5 mg total) by nebulization every 6 (six) hours as needed for wheezing or shortness of breath. 12/05/18   Isla Pence, MD  ALPRAZolam Duanne Moron) 1 MG tablet TAKE 1/2 TO 1 TABLET DAILY AS NEEDED FOR ANXIETY 03/01/19   Tysinger, Camelia Eng, PA-C  amphetamine-dextroamphetamine (ADDERALL XR) 30 MG 24 hr capsule Take 30 mg by mouth as needed (adhd).     [provider]  benzonatate (TESSALON) 200 MG capsule Take 1 capsule (200 mg total) by mouth 3 (three) times daily as needed for cough. 12/01/18 12/01/19  Parrett, Fonnie Mu, NP  carvedilol (COREG CR) 20 MG 24 hr capsule TAKE 1 CAPSULE BY MOUTH EVERY DAY 06/30/18   Libby Maw, MD  chlorthalidone (HYGROTON) 25 MG tablet Take 1 tablet (25 mg total) by mouth daily. 09/12/18   Tysinger, Camelia Eng, PA-C  dexlansoprazole (DEXILANT) 60 MG capsule Take 1 capsule (60 mg total) by mouth daily. 09/13/18   Cirigliano, Vito V, DO  ferrous sulfate 325 (65 FE) MG EC tablet Take 1 tablet (325 mg total) by mouth daily with breakfast. 08/31/18   Libby Maw, MD  FLUoxetine (PROZAC) 20 MG tablet Take 1 tablet (20 mg total) by mouth daily. 11/01/18   Tysinger, Camelia Eng, PA-C  guaiFENesin (MUCINEX) 600 MG 12 hr tablet Take 1,200 mg by mouth 2 (two) times daily.    [provider]  HYDROcodone-acetaminophen (NORCO) 7.5-325 MG tablet Take 1 tablet by mouth every 6 (six) hours as needed  for moderate pain. 04/23/19   Raylene Everts, MD  lidocaine (LIDODERM) 5 % Place 1 patch onto the skin daily. Remove & Discard patch within 12 hours or as directed by MD 04/18/19   Tacy Learn, PA-C  loratadine (CLARITIN) 10 MG tablet Take 10 mg by mouth daily.    [provider]  methocarbamol (ROBAXIN) 500 MG tablet Take 1 tablet (500 mg total) by mouth 2 (two) times daily. 04/23/19   Raylene Everts, MD  mometasone-formoterol [facility]) 100-5 MCG/ACT AERO Inhale 2 puffs into the lungs 2 (two) times daily. 11/24/17   Argentina Donovan, PA-C  nitrofurantoin, macrocrystal-monohydrate, (MACROBID) 100 MG capsule Take 1 capsule (100 mg total) by mouth 2 (two) times  daily. 11/27/18   Tysinger, Camelia Eng, PA-C  Potassium Chloride ER 20 MEQ TBCR Take one daily. Patient taking differently: Take 20 mEq by mouth daily. Take one daily. 09/12/18   Tysinger, Camelia Eng, PA-C  pregabalin (LYRICA) 50 MG capsule Take 1 capsule (50 mg total) by mouth 3 (three) times daily. 09/15/18   Tysinger, Camelia Eng, PA-C  spironolactone (ALDACTONE) 25 MG tablet Take 1 tablet (25 mg total) by mouth daily. 10/04/18   Tysinger, Camelia Eng, PA-C    Family History Family History  Adopted: Yes  Problem Relation Age of Onset  . Other Son        Growing pains  . Asthma Mother   . Heart Problems Mother   . Migraines Mother   . Diabetes Father   . Peptic Ulcer Father   . Heart Problems Father     Social History Social History   Tobacco Use  . Smoking status: Former Smoker    Packs/day: 0.25    Years: 17.00    Pack years: 4.25    Types: Cigarettes    Quit date: 03/02/2013    Years since quitting: 6.3  . Smokeless tobacco: Never Used  Substance Use Topics  . Alcohol use: No    Alcohol/week: 0.0 standard drinks  . Drug use: No    Types: Marijuana    Comment: former - 6 yrs ago     Allergies   Aspirin   Review of Systems Review of Systems  Constitutional: Negative.   HENT: Negative.   Eyes: Positive for  photophobia. Negative for pain, discharge, redness, itching and visual disturbance.  Respiratory: Negative.   Cardiovascular: Negative.   Gastrointestinal: Negative.   Genitourinary: Negative.   Musculoskeletal: Negative.   Skin: Negative.   Neurological: Positive for dizziness and headaches. Negative for tremors, seizures, syncope, facial asymmetry, speech difficulty, weakness, light-headedness and numbness.  All other systems reviewed and are negative.    Physical Exam Updated Vital Signs BP 130/80   Pulse 60   Temp 98.3 F (36.8 C) (Oral)   Resp 15   LMP 02/19/2016   SpO2 100%   Physical Exam  Physical Exam  Constitutional: Pt is oriented to person, place, and time. Pt appears well-developed and well-nourished. No distress.  HENT:  Head: Normocephalic and atraumatic.  Mouth/Throat: Oropharynx is clear and moist.  Eyes: Conjunctivae and EOM are normal. Pupils are equal, round, and reactive to light. No scleral icterus.  No horizontal, vertical or rotational nystagmus  Neck: Normal range of motion. Neck supple.  Full active and passive ROM without pain No midline or paraspinal tenderness No nuchal rigidity or meningeal signs  Cardiovascular: Normal rate, regular rhythm and intact distal pulses.   Pulmonary/Chest: Effort normal and breath sounds normal. No respiratory distress. Pt has no wheezes. No rales.  Abdominal: Soft. Bowel sounds are normal. There is no tenderness. There is no rebound and no guarding.  Musculoskeletal: Normal range of motion.  Range of motion bilateral lower extremities without difficulty.  Full plantarflexion, dorsiflexion, inversion and eversion without difficulty.  Compartments are soft.  Mild tenderness to right lower extremity to lateral malleolus.  Wiggles toes without difficulty.  No Edema, erythema, ecchymosis or warmth.  2+ DP, PT pulses bilaterally Lymphadenopathy:    No cervical adenopathy.  Neurological: Pt. is alert and oriented to person,  place, and time. He has normal reflexes. No cranial nerve deficit.  Exhibits normal muscle tone. Coordination normal.  Mental Status:  Alert, oriented, thought content appropriate. Speech  fluent without evidence of aphasia. Able to follow 2 step commands without difficulty.  Cranial Nerves:  II:  Peripheral visual fields grossly normal, pupils equal, round, reactive to light III,IV, VI: ptosis not present, extra-ocular motions intact bilaterally  V,VII: smile symmetric, facial light touch sensation equal VIII: hearing grossly normal bilaterally  IX,X: midline uvula rise  XI: bilateral shoulder shrug equal and strong XII: midline tongue extension  Motor:  5/5 in upper and lower extremities bilaterally including strong and equal grip strength and dorsiflexion/plantar flexion Sensory: Pinprick and light touch normal in all extremities.  Deep Tendon Reflexes: 2+ and symmetric  Cerebellar: normal finger-to-nose with bilateral upper extremities Gait: normal gait and balance CV: distal pulses palpable throughout   Skin: Skin is warm and dry. No rash noted. Pt is not diaphoretic.  Lacerations, contusions or abrasions. Psychiatric: Pt has a normal mood and affect. Behavior is normal. Judgment and thought content normal.  Nursing note and vitals reviewed. ED Treatments / Results  Labs (all labs ordered are listed, but only abnormal results are displayed) Labs Reviewed  CBC WITH DIFFERENTIAL/PLATELET - Abnormal; Notable for the following components:      Result Value   Hemoglobin 11.5 (*)    All other components within normal limits  COMPREHENSIVE METABOLIC PANEL - Abnormal; Notable for the following components:   Glucose, Bld 68 (*)    Creatinine, Ser 1.09 (*)    Calcium 8.8 (*)    Total Bilirubin 0.2 (*)    All other components within normal limits    EKG EKG Interpretation  Date/Time:  Monday June 25 2019 12:05:37 EDT Ventricular Rate:  60 PR Interval:    QRS Duration: 91 QT  Interval:  434 QTC Calculation: 434 R Axis:   49 Text Interpretation:  Sinus rhythm Borderline T abnormalities, anterior leads No significant change was found Confirmed by Jola Schmidt [phone]) on 06/25/2019 12:12:54 PM   Radiology Dg Ankle Complete Right  Result Date: 06/25/2019 CLINICAL DATA:  Headache for 3-4 days with dizziness.Slipped at work hurting right ankle. EXAM: RIGHT ANKLE - COMPLETE 3+ VIEW COMPARISON:  None FINDINGS: There is no evidence of fracture, dislocation, or joint effusion. There is no evidence of arthropathy or other focal bone abnormality. Soft tissues are unremarkable. IMPRESSION: 1. No acute osseous abnormality. Electronically Signed   By: Kerby Moors M.D.   On: 06/25/2019 10:43   Procedures Procedures (including critical care time)  Medications Ordered in ED Medications  diphenhydrAMINE (BENADRYL) injection 25 mg (25 mg Intravenous Not Given 06/25/19 1346)  acetaminophen (TYLENOL) tablet 1,000 mg (1,000 mg Oral Not Given 06/25/19 1346)  prochlorperazine (COMPAZINE) injection 10 mg (10 mg Intravenous Not Given 06/25/19 1346)  sodium chloride 0.9 % bolus 1,000 mL (0 mLs Intravenous Stopped 06/25/19 1401)  SUMAtriptan (IMITREX) injection 6 mg (6 mg Subcutaneous Given 06/25/19 1225)  ondansetron (ZOFRAN) injection 4 mg (4 mg Intravenous Given 06/25/19 1226)     Initial Impression / Assessment and Plan / ED Course  I have reviewed the triage vital signs and the nursing notes.  Pertinent labs & imaging results that were available during my care of the patient were reviewed by me and considered in my medical decision making (see chart for details).  40 old female peers otherwise well presents for evaluation of 2 separate complaints.  Afebrile, nonseptic, non-ill-appearing.  Patient with headache x4 days.  States feels similar to her previous history of migraines.  Denies sudden onset thunderclap headache.  Normal neurologic exam without neurologic deficits.  Pain  located to right occipital region.  Has had photophobia, phonophobia as well as some nausea.  Did experience some intermittent dizziness today.  No neck stiffness or neck rigidity.  No systemic symptoms.  Patient states she was also at work earlier today where she sustained an inversion of her right ankle.  She was ambulatory after the incident.  Has mild tenderness palpation to her right lateral malleolus.  She has no other bony tenderness.  No edema, erythema, ecchymosis or warmth.  Compartments soft.  2+ DP, PT pulses bilaterally.  Was ambulatory to her room in the ED from the lobby.  Patient does have anaphylaxis to aspirin.  Consulted with pharmacy who states she cannot receive Toradol.  Will trial Imitrex, fluids and reevaluate.  Plain film right lower extremity negative for fracture, dislocation or effusion.  Likely with sprain/strain.  Low suspicion for septic joint, gout, hemarthrosis, compartment syndrome, infectious process, acute fracture, DVT or dislocation.  Will provide ASO brace and discussed rice for symptomatic management.  1300: Patient without significant improvement in headache on reevaluation with Imitrex.  Will trial Compazine and Benadryl.  1330: Nursing has notified me that he went to bring patient her medicines and she refuses.  States her headache has resolved and is requesting DC home, refused ASO brace.  She is ambulatory prior to ED.  Pt HA treated and improved while in ED.  Presentation is like pts typical HA and non concerning for [facility], ICH, Meningitis, or temporal arteritis, dissection, central vertigo process. Pt is afebrile with no focal neuro deficits, nuchal rigidity, or change in vision. Pt is to follow up with PCP to discuss prophylactic medication. Pt verbalizes understanding and is agreeable with plan to dc.   The patient has been appropriately medically screened and/or stabilized in the ED. I have low suspicion for any other emergent medical condition which would  require further screening, evaluation or treatment in the ED or require inpatient management.  Patient is hemodynamically stable and in no acute distress.  Patient able to ambulate in department prior to ED.  Evaluation does not show acute pathology that would require ongoing or additional emergent interventions while in the emergency department or further inpatient treatment.  I have discussed the diagnosis with the patient and answered all questions.  Pain is been managed while in the emergency department and patient has no further complaints prior to discharge.  Patient is comfortable with plan discussed in room and is stable for discharge at this time.  I have discussed strict return precautions for returning to the emergency department.  Patient was encouraged to follow-up with PCP/specialist refer to at discharge.      Final Clinical Impressions(s) / ED Diagnoses   Final diagnoses:  Other migraine without status migrainosus, not intractable  Fall, initial encounter  Acute right ankle pain    ED Discharge Orders    None       Jasmain Ahlberg A, PA-C 06/25/19 Deering, Kevin, MD 06/25/19 1539

## 2019-07-03 ENCOUNTER — Emergency Department (HOSPITAL_COMMUNITY): Payer: BLUE CROSS/BLUE SHIELD

## 2019-07-03 ENCOUNTER — Other Ambulatory Visit: Payer: Self-pay

## 2019-07-03 ENCOUNTER — Encounter (HOSPITAL_COMMUNITY): Payer: Self-pay

## 2019-07-03 ENCOUNTER — Emergency Department (HOSPITAL_COMMUNITY)
Admission: EM | Admit: 2019-07-03 | Discharge: 2019-07-04 | Disposition: A | Discharge status: Home / Self Care | Payer: BLUE CROSS/BLUE SHIELD | Attending: Emergency Medicine | Admitting: Emergency Medicine

## 2019-07-03 DIAGNOSIS — I129 Hypertensive chronic kidney disease with stage 1 through stage 4 chronic kidney disease, or unspecified chronic kidney disease: Secondary | ICD-10-CM | POA: Diagnosis not present

## 2019-07-03 DIAGNOSIS — G43001 Migraine without aura, not intractable, with status migrainosus: Secondary | ICD-10-CM

## 2019-07-03 DIAGNOSIS — J45909 Unspecified asthma, uncomplicated: Secondary | ICD-10-CM | POA: Diagnosis not present

## 2019-07-03 DIAGNOSIS — Z87891 Personal history of nicotine dependence: Secondary | ICD-10-CM | POA: Insufficient documentation

## 2019-07-03 DIAGNOSIS — R0789 Other chest pain: Secondary | ICD-10-CM | POA: Diagnosis present

## 2019-07-03 DIAGNOSIS — N189 Chronic kidney disease, unspecified: Secondary | ICD-10-CM | POA: Diagnosis not present

## 2019-07-03 LAB — TROPONIN I (HIGH SENSITIVITY): Troponin I (High Sensitivity): <2 ng/L (ref <18)

## 2019-07-03 LAB — CBC
HCT: 38.3 % (ref 36.0–46.0)
Hemoglobin: 11.6 g/dL — ABNORMAL LOW (ref 12.0–15.0)
MCH: 26.7 pg (ref 26.0–34.0)
MCHC: 30.3 g/dL (ref 30.0–36.0)
MCV: 88.2 fL (ref 80.0–100.0)
Platelets: 229 10*3/uL (ref 150–400)
RBC: 4.34 MIL/uL (ref 3.87–5.11)
RDW: 14.1 % (ref 11.5–15.5)
WBC: 7.8 10*3/uL (ref 4.0–10.5)
nRBC: 0 % (ref 0.0–0.2)

## 2019-07-03 LAB — BASIC METABOLIC PANEL
Anion gap: 10 (ref 5–15)
BUN: 14 mg/dL (ref 6–20)
CO2: 26 mmol/L (ref 22–32)
Calcium: 8.9 mg/dL (ref 8.9–10.3)
Chloride: 102 mmol/L (ref 98–111)
Creatinine, Ser: 0.96 mg/dL (ref 0.44–1.00)
GFR calc Af Amer: >60 mL/min (ref >60)
GFR calc non Af Amer: >60 mL/min (ref >60)
Glucose, Bld: 118 mg/dL — ABNORMAL HIGH (ref 70–99)
Potassium: 3.5 mmol/L (ref 3.5–5.1)
Sodium: 138 mmol/L (ref 135–145)

## 2019-07-03 LAB — I-STAT BETA HCG BLOOD, ED (NOT ORDERABLE): I-stat hCG, quantitative: <5 m[IU]/mL (ref <5)

## 2019-07-03 MED ORDER — METOCLOPRAMIDE HCL 5 MG/ML IJ SOLN
10.0000 mg | Freq: Once | INTRAMUSCULAR | Status: DC
  Filled 2019-07-03: qty 2

## 2019-07-03 MED ORDER — KETOROLAC TROMETHAMINE 15 MG/ML IJ SOLN
15.0000 mg | Freq: Once | INTRAMUSCULAR | Status: DC
  Filled 2019-07-03: qty 1

## 2019-07-03 MED ORDER — DEXAMETHASONE SODIUM PHOSPHATE 10 MG/ML IJ SOLN
10.0000 mg | Freq: Once | INTRAMUSCULAR | Status: DC
  Filled 2019-07-03: qty 1

## 2019-07-03 MED ORDER — SODIUM CHLORIDE 0.9% FLUSH: 3.0000 mL | Freq: Once | INTRAVENOUS | Status: DC

## 2019-07-03 NOTE — ED Triage Notes (Signed)
Patient c/o mid chest pain since last night. Patient states her arms and hands feel numb at times. Patient also c/o SOB and diaphoresis at times.  Patient reports that she had a migraine a week ago and was told if it did not get any better to come back. Patient also c/o floaters, blurred vision, and sensitivity to light.  patient was also seen last week for right ankle pain and states the pain is no better.

## 2019-07-04 LAB — TROPONIN I (HIGH SENSITIVITY): Troponin I (High Sensitivity): <2 ng/L (ref <18)

## 2019-07-04 MED ORDER — KETOROLAC TROMETHAMINE 30 MG/ML IJ SOLN: 15.0000 mg | Freq: Once | INTRAMUSCULAR | Status: DC

## 2019-07-04 MED ORDER — DEXAMETHASONE SODIUM PHOSPHATE 10 MG/ML IJ SOLN
10.0000 mg | Freq: Once | INTRAMUSCULAR | Status: AC
  Administered 2019-07-04: 10 mg via INTRAMUSCULAR

## 2019-07-04 MED ORDER — METOCLOPRAMIDE HCL 5 MG/ML IJ SOLN
10.0000 mg | Freq: Once | INTRAMUSCULAR | Status: AC
  Administered 2019-07-04: 10 mg via INTRAMUSCULAR

## 2019-07-04 MED ORDER — ACETAMINOPHEN 500 MG PO TABS
1000.0000 mg | ORAL_TABLET | Freq: Once | ORAL | Status: AC
  Administered 2019-07-04: 1000 mg via ORAL
  Filled 2019-07-04: qty 2

## 2019-07-04 MED ORDER — SUMATRIPTAN SUCCINATE 6 MG/0.5ML ~~LOC~~ SOLN
6.0000 mg | Freq: Once | SUBCUTANEOUS | Status: AC
  Administered 2019-07-04: 6 mg via SUBCUTANEOUS
  Filled 2019-07-04: qty 0.5

## 2019-07-04 MED ORDER — DIPHENHYDRAMINE HCL 25 MG PO CAPS
50.0000 mg | ORAL_CAPSULE | Freq: Once | ORAL | Status: AC
  Administered 2019-07-04: 50 mg via ORAL
  Filled 2019-07-04: qty 2

## 2019-07-04 MED ORDER — KETOROLAC TROMETHAMINE 30 MG/ML IJ SOLN
15.0000 mg | Freq: Once | INTRAMUSCULAR | Status: AC
  Administered 2019-07-04: 15 mg via INTRAMUSCULAR

## 2019-07-04 NOTE — ED Provider Notes (Signed)
[facility] DEPT Provider Note   CSN: [id number] Arrival date & time: 07/03/19  1654    History   Chief Complaint Chief Complaint  Patient presents with   Chest Pain   Migraine    HPI Victoria Moore is a 43 y.o. female.  HPI: A 43 year old patient with a history of hypertension and obesity presents for evaluation of chest pain. Initial onset of pain was less than one hour ago. The patient's chest pain is not worse with exertion. The patient reports some diaphoresis. The patient's chest pain is middle- or left-sided, is not well-localized, is not described as heaviness/pressure/tightness, is not sharp and does not radiate to the arms/jaw/neck. The patient does not complain of nausea. The patient has no history of stroke, has no history of peripheral artery disease, has not smoked in the past 90 days, denies any history of treated diabetes, has no relevant family history of coronary artery disease (first degree relative at less than age 42) and has no history of hypercholesterolemia.   Patient is also complaining of headache. She has history of migraines.  The migrainous headaches have been going on for a week.  Headaches are located in the bilateral temporal region and described as throbbing pain, and she has associated dizziness and floaters -all of which are common with her migrainous headaches.  Patient is also complaining of sensitivity to light.  HPI  Past Medical History:  Diagnosis Date   Allergy    Anemia    Anxiety    Arthritis    Asthma    Blood transfusion without reported diagnosis    Chronic kidney disease    Common migraine with intractable migraine 10/07/2016   Depression    GERD (gastroesophageal reflux disease)    Hypertension    Migraine    Sarcoidosis    personal history of   Seizures ([place])    history of     Patient Active Problem List   Diagnosis Date Noted   Leg pain 12/07/2018   Low libido 11/27/2018     Prolonged capillary refill time 10/04/2018   Decreased pedal pulses 10/04/2018   Spondylosis 09/15/2018   Chronic low back pain with sciatica 09/15/2018   Impaired fasting blood sugar 09/11/2018   Weight gain 09/11/2018   Edema 09/11/2018   Frequency of urination 09/11/2018   Right leg swelling 08/18/2018   Mild sleep apnea 08/03/2018   Hypokalemia 06/28/2018   Medication side effect 06/22/2018   Acute low back pain without sciatica 06/22/2018   Personal history of sarcoidosis 05/26/2018   Vitamin D deficiency 05/26/2018   Encounter for health maintenance examination with abnormal findings 05/26/2018   Depression, major, single episode, moderate ([place]) 05/26/2018   Urinary tract infection without hematuria 05/26/2018   Daytime sleepiness 05/26/2018   Chest wall pain 05/26/2018   GERD (gastroesophageal reflux disease) 05/26/2018   Essential hypertension, benign 11/15/2016   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/10/2016   Morbid (severe) obesity due to excess calories ([place]) 10/23/2016   Cough variant asthma vs UACS  10/23/2016   Common migraine with intractable migraine 10/07/2016   Dyspnea 04/15/2016   Hilar adenopathy 04/15/2016   Fibroids 03/11/2016   Anemia 11/12/2014   Iron deficiency 11/12/2014    Past Surgical History:  Procedure Laterality Date   ABDOMINAL HYSTERECTOMY     LAPAROSCOPIC OVARIAN CYSTECTOMY Left 03/11/2016   Procedure: LAPAROSCOPIC OVARIAN CYSTECTOMY;  Surgeon: Eldred Manges, MD;  Location: [facility];  Service: Gynecology;  Laterality: Left;  LAPAROSCOPIC VAGINAL HYSTERECTOMY WITH SALPINGECTOMY Bilateral 03/11/2016   Procedure: LAPAROSCOPIC ASSISTED VAGINAL HYSTERECTOMY WITH SALPINGECTOMY;  Surgeon: Eldred Manges, MD;  Location: [facility];  Service: Gynecology;  Laterality: Bilateral;   TUBAL LIGATION       OB History    Gravida  1   Para      Term      Preterm      AB      Living        SAB      TAB       Ectopic      Multiple      Live Births               Home Medications    Prior to Admission medications   Medication Sig Start Date End Date Taking? Authorizing Provider  albuterol (PROVENTIL HFA;VENTOLIN HFA) 108 (90 Base) MCG/ACT inhaler Inhale 1-2 puffs into the lungs every 4 (four) hours as needed for wheezing or shortness of breath (cough). 01/06/18  Yes Gildardo Pounds, NP  albuterol (PROVENTIL) (2.5 MG/3ML) 0.083% nebulizer solution Take 3 mLs (2.5 mg total) by nebulization every 6 (six) hours as needed for wheezing or shortness of breath. 12/05/18  Yes Isla Pence, MD  ALPRAZolam Duanne Moron) 1 MG tablet TAKE 1/2 TO 1 TABLET DAILY AS NEEDED FOR ANXIETY Patient taking differently: Take 0.5-1 mg by mouth daily as needed for anxiety.  03/01/19  Yes Tysinger, Camelia Eng, PA-C  amphetamine-dextroamphetamine (ADDERALL XR) 30 MG 24 hr capsule Take 30 mg by mouth daily as needed (ADHD).    Yes [provider]  carvedilol (COREG CR) 20 MG 24 hr capsule TAKE 1 CAPSULE BY MOUTH EVERY DAY Patient taking differently: Take 20 mg by mouth daily.  06/30/18  Yes Libby Maw, MD  cetirizine (ZYRTEC) 10 MG tablet Take 10 mg by mouth daily as needed for allergies.   Yes [provider]  chlorthalidone (HYGROTON) 25 MG tablet Take 1 tablet (25 mg total) by mouth daily. 09/12/18  Yes Tysinger, Camelia Eng, PA-C  esomeprazole (NEXIUM) 20 MG capsule Take 20 mg by mouth daily at 12 noon.   Yes [provider]  ferrous sulfate 325 (65 FE) MG EC tablet Take 1 tablet (325 mg total) by mouth daily with breakfast. 08/31/18  Yes Libby Maw, MD  guaiFENesin (MUCINEX) 600 MG 12 hr tablet Take 600 mg by mouth daily as needed for cough or to loosen phlegm.    Yes [provider]  HYDROcodone-acetaminophen (NORCO) 7.5-325 MG tablet Take 1 tablet by mouth every 6 (six) hours as needed for moderate pain. 04/23/19  Yes Raylene Everts, MD  mometasone-formoterol [facility])  100-5 MCG/ACT AERO Inhale 2 puffs into the lungs 2 (two) times daily. 11/24/17  Yes Argentina Donovan, PA-C  Multiple Vitamins-Minerals (MULTIVITAMIN GUMMIES WOMENS) CHEW Chew 2 each by mouth daily.   Yes [provider]  Potassium Chloride ER 20 MEQ TBCR Take one daily. Patient taking differently: Take 20 mEq by mouth daily. Take one daily. 09/12/18  Yes Tysinger, Camelia Eng, PA-C  spironolactone (ALDACTONE) 25 MG tablet Take 1 tablet (25 mg total) by mouth daily. 10/04/18  Yes Tysinger, Camelia Eng, PA-C  benzonatate (TESSALON) 200 MG capsule Take 1 capsule (200 mg total) by mouth 3 (three) times daily as needed for cough. Patient not taking: Reported on 07/04/2019 12/01/18 12/01/19  Parrett, Fonnie Mu, NP  dexlansoprazole (DEXILANT) 60 MG capsule Take 1 capsule (60 mg total) by  mouth daily. Patient not taking: Reported on 07/04/2019 09/13/18   Cirigliano, Vito V, DO  FLUoxetine (PROZAC) 20 MG tablet Take 1 tablet (20 mg total) by mouth daily. Patient not taking: Reported on 07/04/2019 11/01/18   Tysinger, Camelia Eng, PA-C  lidocaine (LIDODERM) 5 % Place 1 patch onto the skin daily. Remove & Discard patch within 12 hours or as directed by MD Patient not taking: Reported on 07/04/2019 04/18/19   Suella Broad A, PA-C  methocarbamol (ROBAXIN) 500 MG tablet Take 1 tablet (500 mg total) by mouth 2 (two) times daily. Patient not taking: Reported on 07/04/2019 04/23/19   Raylene Everts, MD  nitrofurantoin, macrocrystal-monohydrate, (MACROBID) 100 MG capsule Take 1 capsule (100 mg total) by mouth 2 (two) times daily. Patient not taking: Reported on 07/04/2019 11/27/18   Tysinger, Camelia Eng, PA-C  pregabalin (LYRICA) 50 MG capsule Take 1 capsule (50 mg total) by mouth 3 (three) times daily. Patient not taking: Reported on 07/04/2019 09/15/18   Tysinger, Camelia Eng, PA-C    Family History Family History  Adopted: Yes  Problem Relation Age of Onset   Other Son        Growing pains   Asthma Mother    Heart Problems Mother     Migraines Mother    Diabetes Father    Peptic Ulcer Father    Heart Problems Father     Social History Social History   Tobacco Use   Smoking status: Former Smoker    Packs/day: 0.25    Years: 17.00    Pack years: 4.25    Types: Cigarettes    Quit date: 03/02/2013    Years since quitting: 6.3   Smokeless tobacco: Never Used  Substance Use Topics   Alcohol use: No    Alcohol/week: 0.0 standard drinks   Drug use: Not Currently    Types: Marijuana    Comment: former - 6 yrs ago     Allergies   Aspirin   Review of Systems Review of Systems  Constitutional: Positive for activity change.  Eyes: Positive for visual disturbance.  Cardiovascular: Positive for chest pain.  Neurological: Positive for headaches.  All other systems reviewed and are negative.    Physical Exam Updated Vital Signs BP 125/75 (BP Location: Right Arm)    Pulse (!) 59    Temp 98.3 F (36.8 C) (Oral)    Resp 16    Ht 5\' 9"  (1.753 m)    Wt (!) 137.9 kg    LMP 02/19/2016    SpO2 98%    BMI 44.89 kg/m   Physical Exam Vitals signs and nursing note reviewed.  Constitutional:      Appearance: She is well-developed.  HENT:     Head: Normocephalic and atraumatic.  Eyes:     Extraocular Movements: Extraocular movements intact.     Pupils: Pupils are equal, round, and reactive to light.  Neck:     Musculoskeletal: Normal range of motion and neck supple.     Comments: No meningismus Cardiovascular:     Rate and Rhythm: Normal rate.     Heart sounds: Normal heart sounds.  Pulmonary:     Effort: Pulmonary effort is normal.  Abdominal:     General: Bowel sounds are normal.  Musculoskeletal:     Right lower leg: No edema.     Left lower leg: No edema.  Skin:    General: Skin is warm and dry.  Neurological:     Mental Status: She is  alert and oriented to person, place, and time.     Motor: No weakness.     Comments: Besides the floaters, cranial nerves II through XII intact      ED  Treatments / Results  Labs (all labs ordered are listed, but only abnormal results are displayed) Labs Reviewed  BASIC METABOLIC PANEL - Abnormal; Notable for the following components:      Result Value   Glucose, Bld 118 (*)    All other components within normal limits  CBC - Abnormal; Notable for the following components:   Hemoglobin 11.6 (*)    All other components within normal limits  TROPONIN I (HIGH SENSITIVITY)  TROPONIN I (HIGH SENSITIVITY)  I-STAT BETA HCG BLOOD, ED ([facility], [facility], [facility] ONLY)  I-STAT BETA HCG BLOOD, ED (NOT ORDERABLE)    EKG EKG Interpretation  Date/Time:  Tuesday July 03 2019 17:48:46 EDT Ventricular Rate:  75 PR Interval:    QRS Duration: 93 QT Interval:  375 QTC Calculation: 419 R Axis:   52 Text Interpretation:  Sinus rhythm Borderline T abnormalities, anterior leads inferior t wave flattening No significant change since last tracing Confirmed by Varney Biles ([id number]) on 07/03/2019 11:24:38 PM   Radiology Dg Chest 2 View  Result Date: 07/03/2019 CLINICAL DATA:  Chest pain EXAM: CHEST - 2 VIEW COMPARISON:  March 07, 2019 FINDINGS: The heart size is stable. There is no pneumothorax. No large pleural effusion. There is an airspace opacity at the left lung base which is favored to represent atelectasis or scarring and is somewhat similar cross prior studies. IMPRESSION: No active cardiopulmonary disease. Electronically Signed   By: Constance Holster M.D.   On: 07/03/2019 19:26    Procedures Procedures (including critical care time)  Medications Ordered in ED Medications  sodium chloride flush (NS) 0.9 % injection 3 mL (3 mLs Intravenous Not Given 07/04/19 0146)  dexamethasone (DECADRON) injection 10 mg (10 mg Intramuscular Given 07/04/19 0201)  metoCLOPramide (REGLAN) injection 10 mg (10 mg Intramuscular Given 07/04/19 0201)  ketorolac (TORADOL) 30 MG/ML injection 15 mg (15 mg Intramuscular Given 07/04/19 0203)  SUMAtriptan (IMITREX) injection 6 mg (6 mg  Subcutaneous Given 07/04/19 0346)  acetaminophen (TYLENOL) tablet 1,000 mg (1,000 mg Oral Given 07/04/19 0346)  diphenhydrAMINE (BENADRYL) capsule 50 mg (50 mg Oral Given 07/04/19 0346)     Initial Impression / Assessment and Plan / ED Course  I have reviewed the triage vital signs and the nursing notes.  Pertinent labs & imaging results that were available during my care of the patient were reviewed by me and considered in my medical decision making (see chart for details).  Clinical Course as of Jul 03 724  Wed Jul 04, 2019  0724 Patient was reassessed after initial round of medication.  She was still not feeling better, so we gave her second line agents.  She was reassessed again prior to discharge - and she states that her pain had resolved.  Results of the workup discussed. Strict ER return precautions discussed. Follow up instruction discussed, and pt agrees with the plan and is comfortable with it.    [AN]    Clinical Course User Index [AN] Varney Biles, MD    HEAR Score: 58  43 year old comes in a chief complaint of headache and chest pain.  Chest pain is nonspecific and does not appear to be ACS related.  Initial high-sensitivity troponin is undetectable.  ACS essentially ruled out.  It is unclear why she was having the chest discomfort -  as we do not think PE, pericarditis, myocarditis, dissection, costochondritis or likely.  Patient is also having headaches, which is likely her primary complaint. She has history of migraines and states that her current headaches and the associated symptoms are typical of her migraines.  She was seen in the ER few days ago for the same complaint.  Patient states that her headache never completely resolved.   Final Clinical Impressions(s) / ED Diagnoses   Final diagnoses:  Migraine without aura and with status migrainosus, not intractable    ED Discharge Orders    None       Varney Biles, MD 07/04/19 [phone]

## 2019-07-04 NOTE — Discharge Instructions (Signed)
We saw you in the ER for headaches.  It appears that the headaches are due to your migraines.  Please take motrin round the clock for the next 6 hours, and take other meds prescribed only for break through pain. See your doctor if the pain persists, as you might need better medications or a specialist.

## 2019-07-15 ENCOUNTER — Encounter (HOSPITAL_COMMUNITY): Payer: Self-pay | Admitting: Emergency Medicine

## 2019-07-15 ENCOUNTER — Ambulatory Visit (HOSPITAL_COMMUNITY)
Admission: EM | Admit: 2019-07-15 | Discharge: 2019-07-15 | Disposition: A | Discharge status: Home / Self Care | Payer: BLUE CROSS/BLUE SHIELD | Attending: Family Medicine | Admitting: Family Medicine

## 2019-07-15 ENCOUNTER — Other Ambulatory Visit: Payer: Self-pay

## 2019-07-15 DIAGNOSIS — B373 Candidiasis of vulva and vagina: Secondary | ICD-10-CM | POA: Diagnosis not present

## 2019-07-15 DIAGNOSIS — N390 Urinary tract infection, site not specified: Secondary | ICD-10-CM | POA: Insufficient documentation

## 2019-07-15 DIAGNOSIS — B3731 Acute candidiasis of vulva and vagina: Secondary | ICD-10-CM

## 2019-07-15 LAB — POCT URINALYSIS DIP (DEVICE)
Bilirubin Urine: NEGATIVE
Glucose, UA: NEGATIVE mg/dL
Hgb urine dipstick: NEGATIVE
Ketones, ur: NEGATIVE mg/dL
Nitrite: NEGATIVE
Protein, ur: NEGATIVE mg/dL
Specific Gravity, Urine: 1.02 (ref 1.005–1.030)
Urobilinogen, UA: 0.2 mg/dL (ref 0.0–1.0)
pH: 7.5 (ref 5.0–8.0)

## 2019-07-15 MED ORDER — NITROFURANTOIN MONOHYD MACRO 100 MG PO CAPS: 100.0000 mg | ORAL_CAPSULE | Freq: Two times a day (BID) | ORAL | 0 refills | Status: DC

## 2019-07-15 MED ORDER — FLUCONAZOLE 150 MG PO TABS: 150.0000 mg | ORAL_TABLET | ORAL | 0 refills | Status: DC

## 2019-07-15 NOTE — Discharge Instructions (Addendum)
Take the antibiotic Macrobid as prescribed.  Take the yeast infection medication Diflucan as prescribed.    Your urine has been sent for culture; if your medication needs to be changed, we will call you.    Your STD tests are pending.  You should avoid sex until the test results come back and are negative.  If your test results come back positive, your sexual partner will need to be treated.

## 2019-07-15 NOTE — ED Provider Notes (Signed)
[place]    CSN: [id number] Arrival date & time: 07/15/19  1029     History   Chief Complaint Chief Complaint  Patient presents with  . Vaginal Discharge    HPI Victoria Moore is a 43 y.o. female.   Patient presents with 1 week history of white vaginal discharge and vaginal itching.  She has tried over-the-counter yeast infection cream without relief.  She denies pelvic pain, dysuria, flank pain, fever, chills, vomiting, diarrhea.  LMP: hysterectomy.  She is sexually active in a monogamous marriage.     The history is provided by the patient.    Past Medical History:  Diagnosis Date  . Allergy   . Anemia   . Anxiety   . Arthritis   . Asthma   . Blood transfusion without reported diagnosis   . Chronic kidney disease   . Common migraine with intractable migraine 10/07/2016  . Depression   . GERD (gastroesophageal reflux disease)   . Hypertension   . Migraine   . Sarcoidosis    personal history of  . Seizures ([place])    history of     Patient Active Problem List   Diagnosis Date Noted  . Leg pain 12/07/2018  . Low libido 11/27/2018  . Prolonged capillary refill time 10/04/2018  . Decreased pedal pulses 10/04/2018  . Spondylosis 09/15/2018  . Chronic low back pain with sciatica 09/15/2018  . Impaired fasting blood sugar 09/11/2018  . Weight gain 09/11/2018  . Edema 09/11/2018  . Frequency of urination 09/11/2018  . Right leg swelling 08/18/2018  . Mild sleep apnea 08/03/2018  . Hypokalemia 06/28/2018  . Medication side effect 06/22/2018  . Acute low back pain without sciatica 06/22/2018  . Personal history of sarcoidosis 05/26/2018  . Vitamin D deficiency 05/26/2018  . Encounter for health maintenance examination with abnormal findings 05/26/2018  . Depression, major, single episode, moderate ([place]) 05/26/2018  . Urinary tract infection without hematuria 05/26/2018  . Daytime sleepiness 05/26/2018  . Chest wall pain 05/26/2018  . GERD  (gastroesophageal reflux disease) 05/26/2018  . Essential hypertension, benign 11/15/2016  . 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 Morbid (severe) obesity due to excess calories ([place]) 10/23/2016  . Cough variant asthma vs UACS  10/23/2016  . Common migraine with intractable migraine 10/07/2016  . Dyspnea 04/15/2016  . Hilar adenopathy 04/15/2016  . Fibroids 03/11/2016  . Anemia 11/12/2014  . Iron deficiency 11/12/2014    Past Surgical History:  Procedure Laterality Date  . ABDOMINAL HYSTERECTOMY    . LAPAROSCOPIC OVARIAN CYSTECTOMY Left 03/11/2016   Procedure: LAPAROSCOPIC OVARIAN CYSTECTOMY;  Surgeon: Eldred Manges, MD;  Location: [facility];  Service: Gynecology;  Laterality: Left;  . LAPAROSCOPIC VAGINAL HYSTERECTOMY WITH SALPINGECTOMY Bilateral 03/11/2016   Procedure: LAPAROSCOPIC ASSISTED VAGINAL HYSTERECTOMY WITH SALPINGECTOMY;  Surgeon: Eldred Manges, MD;  Location: [facility];  Service: Gynecology;  Laterality: Bilateral;  . TUBAL LIGATION      OB History    Gravida  1   Para      Term      Preterm      AB      Living        SAB      TAB      Ectopic      Multiple      Live Births               Home Medications    Prior to Admission medications   Medication  Sig Start Date End Date Taking? Authorizing Provider  albuterol (PROVENTIL HFA;VENTOLIN HFA) 108 (90 Base) MCG/ACT inhaler Inhale 1-2 puffs into the lungs every 4 (four) hours as needed for wheezing or shortness of breath (cough). 01/06/18   Gildardo Pounds, NP  albuterol (PROVENTIL) (2.5 MG/3ML) 0.083% nebulizer solution Take 3 mLs (2.5 mg total) by nebulization every 6 (six) hours as needed for wheezing or shortness of breath. 12/05/18   Isla Pence, MD  ALPRAZolam Duanne Moron) 1 MG tablet TAKE 1/2 TO 1 TABLET DAILY AS NEEDED FOR ANXIETY Patient taking differently: Take 0.5-1 mg by mouth daily as needed for anxiety.  03/01/19   Tysinger, Camelia Eng, PA-C  amphetamine-dextroamphetamine (ADDERALL XR)  30 MG 24 hr capsule Take 30 mg by mouth daily as needed (ADHD).     [provider]  benzonatate (TESSALON) 200 MG capsule Take 1 capsule (200 mg total) by mouth 3 (three) times daily as needed for cough. Patient not taking: Reported on 07/04/2019 12/01/18 12/01/19  Parrett, Fonnie Mu, NP  carvedilol (COREG CR) 20 MG 24 hr capsule TAKE 1 CAPSULE BY MOUTH EVERY DAY Patient taking differently: Take 20 mg by mouth daily.  06/30/18   Libby Maw, MD  cetirizine (ZYRTEC) 10 MG tablet Take 10 mg by mouth daily as needed for allergies.    [provider]  chlorthalidone (HYGROTON) 25 MG tablet Take 1 tablet (25 mg total) by mouth daily. 09/12/18   Tysinger, Camelia Eng, PA-C  dexlansoprazole (DEXILANT) 60 MG capsule Take 1 capsule (60 mg total) by mouth daily. Patient not taking: Reported on 07/04/2019 09/13/18   Cirigliano, Vito V, DO  esomeprazole (NEXIUM) 20 MG capsule Take 20 mg by mouth daily at 12 noon.    [provider]  ferrous sulfate 325 (65 FE) MG EC tablet Take 1 tablet (325 mg total) by mouth daily with breakfast. 08/31/18   Libby Maw, MD  fluconazole (DIFLUCAN) 150 MG tablet Take 1 tablet (150 mg total) by mouth as directed. Take one tablet now.  May repeat in 3 days if needed. 07/15/19   Sharion Balloon, NP  FLUoxetine (PROZAC) 20 MG tablet Take 1 tablet (20 mg total) by mouth daily. Patient not taking: Reported on 07/04/2019 11/01/18   Tysinger, Camelia Eng, PA-C  guaiFENesin (MUCINEX) 600 MG 12 hr tablet Take 600 mg by mouth daily as needed for cough or to loosen phlegm.     [provider]  HYDROcodone-acetaminophen (NORCO) 7.5-325 MG tablet Take 1 tablet by mouth every 6 (six) hours as needed for moderate pain. 04/23/19   Raylene Everts, MD  lidocaine (LIDODERM) 5 % Place 1 patch onto the skin daily. Remove & Discard patch within 12 hours or as directed by MD Patient not taking: Reported on 07/04/2019 04/18/19   Suella Broad A, PA-C  methocarbamol  (ROBAXIN) 500 MG tablet Take 1 tablet (500 mg total) by mouth 2 (two) times daily. Patient not taking: Reported on 07/04/2019 04/23/19   Raylene Everts, MD  mometasone-formoterol [facility]) 100-5 MCG/ACT AERO Inhale 2 puffs into the lungs 2 (two) times daily. 11/24/17   Argentina Donovan, PA-C  Multiple Vitamins-Minerals (MULTIVITAMIN GUMMIES WOMENS) CHEW Chew 2 each by mouth daily.    [provider]  nitrofurantoin, macrocrystal-monohydrate, (MACROBID) 100 MG capsule Take 1 capsule (100 mg total) by mouth 2 (two) times daily. 07/15/19   Sharion Balloon, NP  Potassium Chloride ER 20 MEQ TBCR Take one daily. Patient taking differently: Take 20  mEq by mouth daily. Take one daily. 09/12/18   Tysinger, Camelia Eng, PA-C  pregabalin (LYRICA) 50 MG capsule Take 1 capsule (50 mg total) by mouth 3 (three) times daily. Patient not taking: Reported on 07/04/2019 09/15/18   Tysinger, Camelia Eng, PA-C  spironolactone (ALDACTONE) 25 MG tablet Take 1 tablet (25 mg total) by mouth daily. 10/04/18   Tysinger, Camelia Eng, PA-C    Family History Family History  Adopted: Yes  Problem Relation Age of Onset  . Other Son        Growing pains  . Asthma Mother   . Heart Problems Mother   . Migraines Mother   . Diabetes Father   . Peptic Ulcer Father   . Heart Problems Father     Social History Social History   Tobacco Use  . Smoking status: Former Smoker    Packs/day: 0.25    Years: 17.00    Pack years: 4.25    Types: Cigarettes    Quit date: 03/02/2013    Years since quitting: 6.3  . Smokeless tobacco: Never Used  Substance Use Topics  . Alcohol use: No    Alcohol/week: 0.0 standard drinks  . Drug use: Not Currently    Types: Marijuana    Comment: former - 6 yrs ago     Allergies   Aspirin   Review of Systems Review of Systems  Constitutional: Negative for chills and fever.  HENT: Negative for ear pain and sore throat.   Eyes: Negative for pain and visual disturbance.  Respiratory: Negative for  cough and shortness of breath.   Cardiovascular: Negative for chest pain and palpitations.  Gastrointestinal: Negative for abdominal pain and vomiting.  Genitourinary: Positive for vaginal discharge. Negative for dysuria, flank pain, hematuria and pelvic pain.  Musculoskeletal: Negative for arthralgias and back pain.  Skin: Negative for color change and rash.  Neurological: Negative for seizures and syncope.  All other systems reviewed and are negative.    Physical Exam Triage Vital Signs ED Triage Vitals  Enc Vitals Group     BP 07/15/19 1131 140/88     Pulse Rate 07/15/19 1131 61     Resp 07/15/19 1131 16     Temp 07/15/19 1131 98.2 F (36.8 C)     Temp Source 07/15/19 1131 Oral     SpO2 07/15/19 1131 98 %     Weight --      Height --      Head Circumference --      Peak Flow --      Pain Score 07/15/19 1136 2     Pain Loc --      Pain Edu? --      Excl. in [place]? --    No data found.  Updated Vital Signs BP 135/77 (BP Location: Right Arm)   Pulse 85   Temp 98.7 F (37.1 C) (Oral)   Resp 18   LMP 02/19/2016   SpO2 100%   Visual Acuity Right Eye Distance:   Left Eye Distance:   Bilateral Distance:    Right Eye Near:   Left Eye Near:    Bilateral Near:     Physical Exam Vitals signs and nursing note reviewed.  Constitutional:      General: She is not in acute distress.    Appearance: She is well-developed.  HENT:     Head: Normocephalic and atraumatic.  Eyes:     Conjunctiva/sclera: Conjunctivae normal.  Neck:     Musculoskeletal: Neck  supple.  Cardiovascular:     Rate and Rhythm: Normal rate and regular rhythm.  Pulmonary:     Effort: Pulmonary effort is normal. No respiratory distress.     Breath sounds: Normal breath sounds.  Abdominal:     Palpations: Abdomen is soft.     Tenderness: There is no abdominal tenderness. There is no right CVA tenderness, left CVA tenderness, guarding or rebound.  Skin:    General: Skin is warm and dry.   Neurological:     Mental Status: She is alert.      UC Treatments / Results  Labs (all labs ordered are listed, but only abnormal results are displayed) Labs Reviewed  POCT URINALYSIS DIP (DEVICE) - Abnormal; Notable for the following components:      Result Value   Leukocytes,Ua TRACE (*)    All other components within normal limits  URINE CULTURE  CERVICOVAGINAL ANCILLARY ONLY    EKG   Radiology No results found.  Procedures Procedures (including critical care time)  Medications Ordered in UC Medications - No data to display  Initial Impression / Assessment and Plan / UC Course  I have reviewed the triage vital signs and the nursing notes.  Pertinent labs & imaging results that were available during my care of the patient were reviewed by me and considered in my medical decision making (see chart for details).  Clinical Course as of Jul 15 1231  Sun Jul 15, 2019  1203 POCT Urinalysis, Dipstick [KT]  1209 POCT Urinalysis, Dipstick [KT]    Clinical Course User Index [KT] Sharion Balloon, NP   UTI.  Vaginal yeast infection.  Treating today with Macrobid and Diflucan.  Urine sent for culture; discussed with patient that we will call her if her medication needs to be changed.  Low suspicion for STD; test sent for gonorrhea, chlamydia, trichomonas, BV.  Instructed patient that she should abstain from sex until her test results are back and are negative.  Instructed patient that her partner will need treatment if any STD tests come back positive.  Final Clinical Impressions(s) / UC Diagnoses   Final diagnoses:  Urinary tract infection without hematuria, site unspecified  Yeast vaginitis     Discharge Instructions     Take the antibiotic Macrobid as prescribed.  Take the yeast infection medication Diflucan as prescribed.    Your urine has been sent for culture; if your medication needs to be changed, we will call you.    Your STD tests are pending.  You should  avoid sex until the test results come back and are negative.  If your test results come back positive, your sexual partner will need to be treated.        ED Prescriptions    Medication Sig Dispense Auth. Provider   fluconazole (DIFLUCAN) 150 MG tablet Take 1 tablet (150 mg total) by mouth as directed. Take one tablet now.  May repeat in 3 days if needed. 2 tablet Sharion Balloon, NP   nitrofurantoin, macrocrystal-monohydrate, (MACROBID) 100 MG capsule Take 1 capsule (100 mg total) by mouth 2 (two) times daily. 10 capsule Sharion Balloon, NP     Controlled Substance Prescriptions [place] Controlled Substance Registry consulted? Not Applicable   Sharion Balloon, NP 07/15/19 1232

## 2019-07-15 NOTE — ED Triage Notes (Signed)
Pt here for vaginal discharge that she thinks is yeast

## 2019-07-16 LAB — URINE CULTURE

## 2019-07-18 LAB — CERVICOVAGINAL ANCILLARY ONLY
Bacterial vaginitis: POSITIVE — AB
Chlamydia: NEGATIVE
Neisseria Gonorrhea: NEGATIVE
Trichomonas: POSITIVE — AB

## 2019-07-19 ENCOUNTER — Encounter (HOSPITAL_COMMUNITY): Payer: Self-pay

## 2019-07-19 ENCOUNTER — Telehealth (HOSPITAL_COMMUNITY): Payer: Self-pay | Admitting: Emergency Medicine

## 2019-07-19 MED ORDER — METRONIDAZOLE 500 MG PO TABS: 500.0000 mg | ORAL_TABLET | Freq: Two times a day (BID) | ORAL | 0 refills | Status: AC

## 2019-07-19 NOTE — Telephone Encounter (Signed)
Bacterial vaginosis is positive. This was not treated at the urgent care visit.  Flagyl 500 mg BID x 7 days #14 no refills sent to patients pharmacy of choice.   Trichomonas is positive. Rx for flagyl was sent to the pharmacy of record. Pt needs education to refrain from sexual intercourse for 7 days to give the medicine time to work. Sexual partners need to be notified and tested/treated. Condoms may reduce risk of reinfection. Recheck for further evaluation if symptoms are not improving.    Attempted to reach patient. No answer at this time. Voicemail left.  [place]

## 2019-07-23 ENCOUNTER — Telehealth (HOSPITAL_COMMUNITY): Payer: Self-pay | Admitting: Emergency Medicine

## 2019-07-23 ENCOUNTER — Telehealth: Payer: Self-pay | Admitting: Medical

## 2019-07-23 NOTE — Telephone Encounter (Signed)
Attempted to reach patient x2. No answer at this time. Phone busy signal

## 2019-07-23 NOTE — Telephone Encounter (Signed)
Dismissal letter in guarantor snapshot  °

## 2019-07-26 ENCOUNTER — Encounter (HOSPITAL_COMMUNITY): Payer: Self-pay

## 2019-07-26 ENCOUNTER — Emergency Department (HOSPITAL_COMMUNITY)
Admission: EM | Admit: 2019-07-26 | Discharge: 2019-07-26 | Disposition: A | Discharge status: Home / Self Care | Payer: Self-pay | Attending: Emergency Medicine | Admitting: Emergency Medicine

## 2019-07-26 ENCOUNTER — Other Ambulatory Visit: Payer: Self-pay

## 2019-07-26 DIAGNOSIS — Z87891 Personal history of nicotine dependence: Secondary | ICD-10-CM | POA: Insufficient documentation

## 2019-07-26 DIAGNOSIS — M5431 Sciatica, right side: Secondary | ICD-10-CM | POA: Insufficient documentation

## 2019-07-26 DIAGNOSIS — J45909 Unspecified asthma, uncomplicated: Secondary | ICD-10-CM | POA: Insufficient documentation

## 2019-07-26 DIAGNOSIS — I10 Essential (primary) hypertension: Secondary | ICD-10-CM | POA: Insufficient documentation

## 2019-07-26 MED ORDER — PREDNISONE 20 MG PO TABS: 40.0000 mg | ORAL_TABLET | Freq: Every day | ORAL | 0 refills | Status: AC

## 2019-07-26 NOTE — Discharge Instructions (Addendum)
You were evaluated in the Emergency Department and after careful evaluation, we did not find any emergent condition requiring admission or further testing in the hospital.  Your symptoms today seem to be due to sciatica.  Please take the prednisone steroid medication as directed.  Please return to the Emergency Department if you experience any worsening of your condition.  We encourage you to follow up with a primary care provider.  Thank you for allowing [place] to be a part of your care.

## 2019-07-26 NOTE — ED Triage Notes (Signed)
Patient c/o right lower back pain that radiates down the right leg x 3 weeks. Patient states she went to an UC and was prescribed an antibiotic for a bladder infection Patient also c/o left knee pain. Patient denies any injury. Patient reports pain is worse with weight bearing.

## 2019-07-26 NOTE — ED Provider Notes (Signed)
[facility] Emergency Department Provider Note MRN:  [id number]  Arrival date & time: 07/26/19     Chief Complaint   Back Pain   History of Present Illness   Victoria Moore is a 43 y.o. year-old female with a history of CKD presenting to the ED with chief complaint of back pain.  1 week of right-sided back pain that radiates to the right buttocks and down the back of the right leg.  Worse with movement.  Moderate in severity.  Diagnosed with UTI and started on antibiotics several days ago, antibiotics are not helping the pain.  Denies fever, no chest pain or shortness of breath, no abdominal pain.  No bowel or bladder dysfunction, no numbness or weakness to the arms or legs.  Review of Systems  A complete 10 system review of systems was obtained and all systems are negative except as noted in the HPI and PMH.   Patient's Health History    Past Medical History:  Diagnosis Date  . Allergy   . Anemia   . Anxiety   . Arthritis   . Asthma   . Blood transfusion without reported diagnosis   . Chronic kidney disease   . Common migraine with intractable migraine 10/07/2016  . Depression   . GERD (gastroesophageal reflux disease)   . Hypertension   . Migraine   . Sarcoidosis    personal history of  . Seizures ([place])    history of     Past Surgical History:  Procedure Laterality Date  . ABDOMINAL HYSTERECTOMY    . LAPAROSCOPIC OVARIAN CYSTECTOMY Left 03/11/2016   Procedure: LAPAROSCOPIC OVARIAN CYSTECTOMY;  Surgeon: Eldred Manges, MD;  Location: [facility];  Service: Gynecology;  Laterality: Left;  . LAPAROSCOPIC VAGINAL HYSTERECTOMY WITH SALPINGECTOMY Bilateral 03/11/2016   Procedure: LAPAROSCOPIC ASSISTED VAGINAL HYSTERECTOMY WITH SALPINGECTOMY;  Surgeon: Eldred Manges, MD;  Location: [facility];  Service: Gynecology;  Laterality: Bilateral;  . TUBAL LIGATION      Family History  Adopted: Yes  Problem Relation Age of Onset  . Other Son        Growing pains   . Asthma Mother   . Heart Problems Mother   . Migraines Mother   . Diabetes Father   . Peptic Ulcer Father   . Heart Problems Father     Social History   Socioeconomic History  . Marital status: Single    Spouse name: Not on file  . Number of children: 1  . Years of education: 43  . Highest education level: Not on file  Occupational History  . Occupation: [employer]  . Financial resource strain: Not on file  . Food insecurity    Worry: Not on file    Inability: Not on file  . Transportation needs    Medical: Not on file    Non-medical: Not on file  Tobacco Use  . Smoking status: Former Smoker    Packs/day: 0.25    Years: 17.00    Pack years: 4.25    Types: Cigarettes    Quit date: 03/02/2013    Years since quitting: 6.4  . Smokeless tobacco: Never Used  Substance and Sexual Activity  . Alcohol use: No    Alcohol/week: 0.0 standard drinks  . Drug use: Not Currently    Types: Marijuana    Comment: former - 6 yrs ago  . Sexual activity: Not on file  Lifestyle  . Physical activity    Days per  week: Not on file    Minutes per session: Not on file  . Stress: Not on file  Relationships  . Social Herbalist on phone: Not on file    Gets together: Not on file    Attends religious service: Not on file    Active member of club or organization: Not on file    Attends meetings of clubs or organizations: Not on file    Relationship status: Not on file  . Intimate partner violence    Fear of current or ex partner: Not on file    Emotionally abused: Not on file    Physically abused: Not on file    Forced sexual activity: Not on file  Other Topics Concern  . Not on file  Social History Narrative   She lives w/ her fiance'. She has one son.    Highest level of education:  12th grade, did not graduate. Currently back in school.   Right-handed   Caffeine: 1 cup of coffee some mornings     Physical Exam  Vital Signs and Nursing Notes reviewed  Vitals:   07/26/19 0711  BP: (!) 126/103  Pulse: 83  Resp: 18  Temp: 98.5 F (36.9 C)  SpO2: 98%    CONSTITUTIONAL: Well-appearing, NAD NEURO:  Alert and oriented x 3, no focal deficits EYES:  eyes equal and reactive ENT/NECK:  no LAD, no JVD CARDIO: Regular rate, well-perfused, normal S1 and S2 PULM:  CTAB no wheezing or rhonchi GI/GU:  normal bowel sounds, non-distended, non-tender MSK/SPINE:  No gross deformities, no edema; tenderness palpation to the bilateral paraspinal lumbar back, right-sided positive straight leg test SKIN:  no rash, atraumatic PSYCH:  Appropriate speech and behavior  Diagnostic and Interventional Summary    Labs Reviewed - No data to display  No orders to display    Medications - No data to display   Procedures Critical Care  ED Course and Medical Decision Making  I have reviewed the triage vital signs and the nursing notes.  Pertinent labs & imaging results that were available during my care of the patient were reviewed by me and considered in my medical decision making (see below for details).  Consistent with sciatica with no red flag symptoms to suggest myelopathy.  Patient is with normal vital signs, she has no CVA tenderness, she is advised to continue and complete her antibiotic course for the urinary tract infection, however her pain seems more related to musculoskeletal etiology.  She has tried multiple medications including ibuprofen and muscle relaxers, will escalate to prednisone burst.  After the discussed management above, the patient was determined to be safe for discharge.  The patient was in agreement with this plan and all questions regarding their care were answered.  ED return precautions were discussed and the patient will return to the ED with any significant worsening of condition.  Barth Kirks. Sedonia Small, MD [place] [email or web address]  Final Clinical Impressions(s) / ED  Diagnoses     ICD-10-CM   1. Sciatica of right side  M54.31     ED Discharge Orders         Ordered    predniSONE (DELTASONE) 20 MG tablet  Daily     07/26/19 0746             Maudie Flakes, MD 07/26/19 [phone]

## 2019-08-10 ENCOUNTER — Encounter: Payer: Self-pay | Admitting: Family

## 2019-08-10 ENCOUNTER — Ambulatory Visit (INDEPENDENT_AMBULATORY_CARE_PROVIDER_SITE_OTHER): Payer: PRIVATE HEALTH INSURANCE | Admitting: Family

## 2019-08-10 ENCOUNTER — Other Ambulatory Visit: Payer: Self-pay

## 2019-08-10 VITALS — BP 138/80 | HR 69 | Temp 98.2°F | Resp 18 | Ht 69.0 in | Wt 313.4 lb

## 2019-08-10 DIAGNOSIS — M5440 Lumbago with sciatica, unspecified side: Secondary | ICD-10-CM | POA: Diagnosis not present

## 2019-08-10 DIAGNOSIS — M19079 Primary osteoarthritis, unspecified ankle and foot: Secondary | ICD-10-CM

## 2019-08-10 DIAGNOSIS — G4730 Sleep apnea, unspecified: Secondary | ICD-10-CM

## 2019-08-10 DIAGNOSIS — F411 Generalized anxiety disorder: Secondary | ICD-10-CM | POA: Diagnosis not present

## 2019-08-10 DIAGNOSIS — G43009 Migraine without aura, not intractable, without status migrainosus: Secondary | ICD-10-CM

## 2019-08-10 DIAGNOSIS — R2232 Localized swelling, mass and lump, left upper limb: Secondary | ICD-10-CM

## 2019-08-10 DIAGNOSIS — R079 Chest pain, unspecified: Secondary | ICD-10-CM

## 2019-08-10 DIAGNOSIS — G8929 Other chronic pain: Secondary | ICD-10-CM

## 2019-08-10 DIAGNOSIS — I10 Essential (primary) hypertension: Secondary | ICD-10-CM

## 2019-08-10 DIAGNOSIS — R0789 Other chest pain: Secondary | ICD-10-CM

## 2019-08-10 DIAGNOSIS — R2231 Localized swelling, mass and lump, right upper limb: Secondary | ICD-10-CM

## 2019-08-10 DIAGNOSIS — G4700 Insomnia, unspecified: Secondary | ICD-10-CM

## 2019-08-10 DIAGNOSIS — J45991 Cough variant asthma: Secondary | ICD-10-CM

## 2019-08-10 DIAGNOSIS — F909 Attention-deficit hyperactivity disorder, unspecified type: Secondary | ICD-10-CM

## 2019-08-10 DIAGNOSIS — L918 Other hypertrophic disorders of the skin: Secondary | ICD-10-CM

## 2019-08-10 MED ORDER — SPIRONOLACTONE 25 MG PO TABS: 25.0000 mg | ORAL_TABLET | Freq: Every day | ORAL | 3 refills | Status: DC

## 2019-08-10 MED ORDER — CHLORTHALIDONE 25 MG PO TABS: 25.0000 mg | ORAL_TABLET | Freq: Every day | ORAL | 3 refills | Status: DC

## 2019-08-10 MED ORDER — ALPRAZOLAM 1 MG PO TABS: 0.5000 mg | ORAL_TABLET | Freq: Every evening | ORAL | 0 refills | Status: DC | PRN

## 2019-08-10 MED ORDER — MOMETASONE FURO-FORMOTEROL FUM 100-5 MCG/ACT IN AERO: 2.0000 | INHALATION_SPRAY | Freq: Two times a day (BID) | RESPIRATORY_TRACT | 3 refills | Status: DC

## 2019-08-10 MED ORDER — ALBUTEROL SULFATE HFA 108 (90 BASE) MCG/ACT IN AERS: 1.0000 | INHALATION_SPRAY | RESPIRATORY_TRACT | 3 refills | Status: DC | PRN

## 2019-08-10 MED ORDER — PREGABALIN 50 MG PO CAPS: 50.0000 mg | ORAL_CAPSULE | Freq: Three times a day (TID) | ORAL | 0 refills | Status: DC

## 2019-08-10 NOTE — Progress Notes (Signed)
Provider: Marlowe Sax FNP-C   Ngetich, Nelda Bucks, NP  Patient Care Team: Ngetich, Nelda Bucks, NP as PCP - General (Family Medicine)  Extended Emergency Contact Information Primary Emergency Contact: [facility] Address: [address]          [place], [place] [address] Johnnette Litter of [place] Phone: [phone] Mobile Phone: [phone] Relation: Significant other Secondary Emergency Contact: Denzil Magnuson Home Phone: [phone] Relation: Other  Code Status:  Full Code  Goals of care: Advanced Directive information Advanced Directives 07/26/2019  Does Patient Have a Medical Advance Directive? No  Would patient like information on creating a medical advance directive? No - Patient declined     Chief Complaint  Patient presents with  . Establish Care    New patient to establish care patient states last night knots popped up on right and left arm and are painful also experiencing some right hand pain and numbess, migraines, and chest pain and dizziness for about a week, and concern about ADHD      HPI:  Pt is a 43 y.o. female seen today to establish care for medical management of chronic diseases.she states was following up with PCP and specialist at [facility] center but her insurance recently changed to [place].She complains of both top of wrist areas swelling and pain since last night feels like knots popped up on right and left arm and are painful.Also has some numbness.she states has a medical history of rheumatoid arthritis but has never seen a rheumatologist.   Migraines - chronic migraines described as intermittent and throbbing.Headache located on bilateral temporal.she states tends to be  sensitive to light.Had dizziness a week ago but none today.she denies any changes in vision or aura.She was seen in the ED for migraine headache 06/25/2019 and 07/03/2019    chest pain - mid chest pain described as tender to touch and occasional pain with breathing.she denies any chest pain  radiation to arm or jaw.status post ED visit for chest wall pain 07/26/2019.Troponin negative.EKG showed Normal sinus Rhythm with borderline T abnormalities on anterior leads.she has had occasional dizziness for about a week but none today.    ADHD- concerned about her ADHD states takes Adderall 30 mg tablet as needed.Has not required in the past one week.she has not been seen by Psychiatrist for her ADHD.    Lower chronic back pain - chronic.states bilateral lower back and radiates down to her legs.Has had multiple visit to the ED for similar symptoms.she denies any new onset of urine incontinency,loss of bowel control,numbness,tingling or weakness of legs.    Sleep Apnea -states sleep study recommended in the past but her insurance has change.will need a new Pulmonologist referral.   Asthma - on Albuterol HFA every 4 hours as needed.Also has albuterol via Nebulizer.On Dulera 100-5 mcg/ACT 2 puffs twice daily.No worsening of symptoms reported.  GERD- states currently off medication.Takes OTC as needed.  Depression/anxiety- she states has tried several medication like Prozac and Zoloft in the past.Alprazolam has been effective.    Hypertension -she does  Not check her blood pressure at home.Takes spironolactone 25 mg tablet daily and chlorthalidone 25 mg tablet daily.Has chronic mid-chest pain as above.   Past Medical History:  Diagnosis Date  . ADHD   . Allergy   . Anemia   . Anxiety   . Arthritis   . Asthma   . Blood transfusion without reported diagnosis   . Chronic kidney disease   . Common migraine with intractable migraine 10/07/2016  .  Depression   . Diabetes ([place])   . Edema   . GERD (gastroesophageal reflux disease)   . Herniated lumbar intervertebral disc   . Hilar adenopathy   . Hypertension   . Iron deficiency   . Migraine   . Prolonged capillary refill time   . Sarcoidosis    personal history of  . Seizures ([place])    history of   . Sleep apnea   . Spondylosis   .  Vitamin D deficiency    Past Surgical History:  Procedure Laterality Date  . ABDOMINAL HYSTERECTOMY    . LAPAROSCOPIC OVARIAN CYSTECTOMY Left 03/11/2016   Procedure: LAPAROSCOPIC OVARIAN CYSTECTOMY;  Surgeon: Eldred Manges, MD;  Location: [facility];  Service: Gynecology;  Laterality: Left;  . LAPAROSCOPIC VAGINAL HYSTERECTOMY WITH SALPINGECTOMY Bilateral 03/11/2016   Procedure: LAPAROSCOPIC ASSISTED VAGINAL HYSTERECTOMY WITH SALPINGECTOMY;  Surgeon: Eldred Manges, MD;  Location: [facility];  Service: Gynecology;  Laterality: Bilateral;  . TUBAL LIGATION      Allergies  Allergen Reactions  . Aspirin Anaphylaxis and Hives         Allergies as of 08/10/2019      Reactions   Aspirin Anaphylaxis, Hives          Medication List       Accurate as of August 10, 2019 12:12 PM. If you have any questions, ask your nurse or doctor.        albuterol 108 (90 Base) MCG/ACT inhaler Commonly known as: VENTOLIN HFA Inhale 1-2 puffs into the lungs every 4 (four) hours as needed for wheezing or shortness of breath.   albuterol (2.5 MG/3ML) 0.083% nebulizer solution Commonly known as: PROVENTIL Take 2.5 mg by nebulization every 6 (six) hours as needed for wheezing or shortness of breath.   ALPRAZolam 1 MG tablet Commonly known as: XANAX Take 0.5-1 mg by mouth at bedtime as needed for anxiety.   amphetamine-dextroamphetamine 30 MG 24 hr capsule Commonly known as: ADDERALL XR Take 30 mg by mouth daily as needed (ADHD).   chlorthalidone 25 MG tablet Commonly known as: HYGROTON Take 25 mg by mouth daily.   ferrous sulfate 325 (65 FE) MG tablet Take 325 mg by mouth daily with breakfast.   HYDROcodone-acetaminophen 7.5-325 MG tablet Commonly known as: NORCO Take 1 tablet by mouth every 6 (six) hours as needed for moderate pain.   mometasone-formoterol 100-5 MCG/ACT Aero Commonly known as: DULERA Inhale 2 puffs into the lungs 2 (two) times daily.   pregabalin 50 MG capsule Commonly  known as: LYRICA Take 50 mg by mouth 3 (three) times daily.   spironolactone 25 MG tablet Commonly known as: ALDACTONE Take 25 mg by mouth daily.       Review of Systems  Constitutional: Positive for fatigue. Negative for appetite change, chills, fever and unexpected weight change.  HENT: Negative for congestion, rhinorrhea, sinus pressure, sinus pain, sneezing and sore throat.   Eyes: Negative for pain, discharge, redness, itching and visual disturbance.  Respiratory: Negative for cough, chest tightness, shortness of breath and wheezing.   Cardiovascular: Negative for palpitations and leg swelling.       Chronic chest wall pain   Gastrointestinal: Negative for abdominal distention, abdominal pain, constipation, diarrhea, nausea and vomiting.  Endocrine: Negative for cold intolerance, heat intolerance, polydipsia, polyphagia and polyuria.  Genitourinary: Negative for difficulty urinating, dyspareunia, dysuria, flank pain, frequency and urgency.  Musculoskeletal: Positive for arthralgias, back pain and joint swelling. Negative for gait problem, neck pain and neck stiffness.  Skin: Negative for color change, pallor and rash.       Skin tag on right arm   Neurological: Positive for headaches. Negative for weakness, light-headedness and numbness.       Occasional dizziness,migraines   Hematological: Does not bruise/bleed easily.  Psychiatric/Behavioral: Positive for sleep disturbance. Negative for agitation, confusion and suicidal ideas. The patient is nervous/anxious.        ADHD,Deprssion     Immunization History  Administered Date(s) Administered  . Influenza-Unspecified 08/03/2019  . PPD Test 04/20/2017  . Pneumococcal Polysaccharide-23 11/13/2014  . Td 10/18/2015  . Tdap 05/08/2014, 01/15/2016   Pertinent  Health Maintenance Due  Topic Date Due  . PAP SMEAR-Modifier  05/26/2020  . INFLUENZA VACCINE  Completed   Fall Risk  12/17/2016  Falls in the past year? No     Vitals:   08/10/19 1124  BP: 138/80  Pulse: 69  Resp: 18  Temp: 98.2 F (36.8 C)  TempSrc: Oral  SpO2: 98%  Weight: (!) 313 lb 6.4 oz (142.2 kg)  Height: '5\' 9"'$  (1.753 m)   Body mass index is 46.28 kg/m. Physical Exam Vitals signs reviewed.  Constitutional:      General: She is not in acute distress.    Appearance: She is obese. She is not ill-appearing.  HENT:     Head: Normocephalic.     Right Ear: Tympanic membrane, ear canal and external ear normal. There is no impacted cerumen.     Left Ear: Tympanic membrane, ear canal and external ear normal. There is no impacted cerumen.     Nose: Nose normal. No congestion or rhinorrhea.     Mouth/Throat:     Mouth: Mucous membranes are moist.     Pharynx: Oropharynx is clear. No oropharyngeal exudate or posterior oropharyngeal erythema.  Eyes:     General: No scleral icterus.       Right eye: No discharge.        Left eye: No discharge.     Extraocular Movements: Extraocular movements intact.     Conjunctiva/sclera: Conjunctivae normal.     Pupils: Pupils are equal, round, and reactive to light.  Neck:     Musculoskeletal: Normal range of motion. No neck rigidity or muscular tenderness.     Vascular: No carotid bruit.  Cardiovascular:     Rate and Rhythm: Normal rate and regular rhythm.     Pulses: Normal pulses.     Heart sounds: Normal heart sounds. No murmur. No friction rub. No gallop.   Pulmonary:     Effort: Pulmonary effort is normal. No respiratory distress.     Breath sounds: Normal breath sounds. No wheezing, rhonchi or rales.  Chest:     Chest wall: No tenderness.  Abdominal:     General: Bowel sounds are normal. There is no distension.     Palpations: Abdomen is soft. There is no mass.     Tenderness: There is no abdominal tenderness. There is no right CVA tenderness, left CVA tenderness, guarding or rebound.  Musculoskeletal: Normal range of motion.        General: No swelling, tenderness or deformity.      Right lower leg: No edema.     Left lower leg: No edema.  Lymphadenopathy:     Cervical: No cervical adenopathy.  Skin:    General: Skin is warm and dry.     Coloration: Skin is not pale.     Findings: No bruising, erythema or rash.  Neurological:  Mental Status: She is oriented to person, place, and time.     Cranial Nerves: No cranial nerve deficit.     Sensory: No sensory deficit.     Motor: No weakness.     Coordination: Coordination normal.     Gait: Gait normal.  Psychiatric:        Mood and Affect: Mood is depressed. Mood is not anxious. Affect is flat. Affect is not tearful.        Speech: Speech normal.        Behavior: Behavior is withdrawn. Behavior is cooperative.        Thought Content: Thought content normal.        Judgment: Judgment normal.     Comments: Falls asleep intermittently during visit    Labs reviewed: Recent Labs    04/18/19 0154 06/25/19 1109 07/03/19 2107  NA 136 141 138  K 3.2* 3.7 3.5  CL 105 106 102  CO2 '22 26 26  '$ GLUCOSE 127* 68* 118*  BUN 5* 14 14  CREATININE 1.00 1.09* 0.96  CALCIUM 8.9 8.8* 8.9   Recent Labs    04/18/19 0154 06/25/19 1109  AST 21 17  ALT 17 12  ALKPHOS 50 50  BILITOT 0.4 0.2*  PROT 6.8 7.0  ALBUMIN 3.8 3.8   Recent Labs    04/18/19 0154 06/25/19 1109 07/03/19 2107  WBC 5.1 4.4 7.8  NEUTROABS 2.5 2.5  --   HGB 11.7* 11.5* 11.6*  HCT 37.3 36.8 38.3  MCV 84.8 88.5 88.2  PLT 216 199 229   Lab Results  Component Value Date   TSH 2.220 09/11/2018   Lab Results  Component Value Date   HGBA1C 6.3 (H) 09/11/2018   Lab Results  Component Value Date   CHOL 178 06/27/2018   HDL 49.90 06/27/2018   LDLCALC 115 (H) 06/27/2018   TRIG 63.0 06/27/2018   CHOLHDL 4 06/27/2018    Significant Diagnostic Results in last 30 days:  No results found.  Assessment/Plan 1. Essential hypertension No blood pressure log for review but blood pressure at goal this visit.spironolactone 25 mg tablet daily and  chlorthalidone 25 mg tablet daily.will consider adding ASA and Statin on next visit.Awaiting labs an previous records.  - CBC with Differential/Platelet - CMP with eGFR([facility]) - TSH - Lipid Panel  2. Generalized anxiety disorder Stable.continue on Alprazolam 0.5 -1 mg tablet at bedtime as needed.   3. Chronic low back pain with sciatica, sciatica laterality unspecified, unspecified back pain laterality Has had frequent ED visit for pain.No evaluation by Orthopedic noted.Had Norco from urgent care x 7 days.will not refill Norco for now.encouraged to apply OTC salon pas daily. - Urine Drug Screen w/Alc, no confirm([facility]) - Ambulatory referral to Orthopedic Surgery  4. Arthritis of ankle joint Intermittent swelling.Reports RA but has never had confirmed diagnosis. - Rheumatic Factor,Sed rate.  5. Localized swelling of both hands No signs of inflammation.will refer to Rheumatologist if labs indicate. - Sedimentation Rate - Rheumatoid Factor  6. Cough variant asthma vs UACS  Afebrile.bilateral lungs clear.continue on Albuterol inhaler 1-2 puffs every 4 hours as needed.Proventil 2.5 mg every 6 hours as needed via nebulizer.  - Ambulatory referral to Pulmonology  7. Sleep apnea, unspecified type - Ambulatory referral to Pulmonology  8. Morbid (severe) obesity due to excess calories (HCC) Diet and lifestyle modification advised.  - TSH  9. Attention deficit hyperactivity disorder (ADHD), unspecified ADHD type On adderall 30 mg capsule daily as needed initiated by previous PCP.No  records for review.Has not seen a psychiatry.will need evaluation by Psychiatry once previous medical records are obtained.    10. Skin tag Right arm moderate size soft similar skin color.Non-tender to touch.  - Ambulatory referral to Dermatology  11. Chest pain of uncertain etiology Status post ED  Mid-chest pain.Troponin negative.EKG showed Normal sinus Rhythm with borderline T abnormalities on anterior  leads.she has had occasional dizziness for about a week but none today.  - Ambulatory referral to Cardiology  12. Migraine without aura and without status migrainosus, not intractable Chronic. - Ambulatory referral to Neurology  13. Insomnia, unspecified type Recommended melatonin 3 mg tablet daily at bedtime may repeat x 1 dose if still unable to sleep.   Family/ staff Communication: Reviewed plan of care with patient.  Labs/tests ordered:  - CBC with Differential/Platelet - CMP with eGFR([facility]) - TSH - Lipid Panel - Sedimentation Rate - Rheumatoid Factor  Dinah C Ngetich, NP

## 2019-08-13 MED ORDER — BACLOFEN 5 MG PO TABS: 10.0000 mg | ORAL_TABLET | Freq: Two times a day (BID) | ORAL | 0 refills | Status: AC | PRN

## 2019-08-15 ENCOUNTER — Telehealth: Payer: Self-pay

## 2019-08-15 NOTE — Telephone Encounter (Signed)
Covid-19 screening questions   Do you now or have you had a fever in the last 14 days? No  Do you have any respiratory symptoms of shortness of breath or cough now or in the last 14 days? No  Do you have any family members or close contacts with diagnosed or suspected Covid-19 in the past 14 days? No  Have you been tested for Covid-19 and found to be positive? No        

## 2019-08-16 ENCOUNTER — Ambulatory Visit (INDEPENDENT_AMBULATORY_CARE_PROVIDER_SITE_OTHER): Payer: PRIVATE HEALTH INSURANCE | Admitting: Gastroenterology

## 2019-08-16 ENCOUNTER — Telehealth: Payer: Self-pay

## 2019-08-16 ENCOUNTER — Other Ambulatory Visit: Payer: Self-pay

## 2019-08-16 ENCOUNTER — Encounter: Payer: Self-pay | Admitting: Gastroenterology

## 2019-08-16 VITALS — BP 146/100 | HR 71 | Temp 98.5°F | Ht 69.0 in | Wt 315.4 lb

## 2019-08-16 DIAGNOSIS — R1310 Dysphagia, unspecified: Secondary | ICD-10-CM

## 2019-08-16 DIAGNOSIS — K219 Gastro-esophageal reflux disease without esophagitis: Secondary | ICD-10-CM | POA: Diagnosis not present

## 2019-08-16 DIAGNOSIS — R1013 Epigastric pain: Secondary | ICD-10-CM | POA: Diagnosis not present

## 2019-08-16 DIAGNOSIS — Z1211 Encounter for screening for malignant neoplasm of colon: Secondary | ICD-10-CM

## 2019-08-16 DIAGNOSIS — Z1212 Encounter for screening for malignant neoplasm of rectum: Secondary | ICD-10-CM

## 2019-08-16 MED ORDER — CLENPIQ 10-3.5-12 MG-GM -GM/160ML PO SOLN: 1.0000 | ORAL | 0 refills | Status: DC

## 2019-08-16 NOTE — Telephone Encounter (Signed)
Form was sent in for a Prior Authorization on Pregabalin 50 mg I sent the information for the Authorization by [email or web address]

## 2019-08-16 NOTE — Telephone Encounter (Signed)
I agree patient needs to be evaluated in the ED as soon as possible.

## 2019-08-16 NOTE — Telephone Encounter (Signed)
FYI       Patient called the office to say she had a blood pressure of 146/100 and she was having sharp chest pains and she was at her [place] office at the time and was told to call our office I explained to her Victoria Moore was with a patient but if she was in that much distress she should seek help at an ER she responded Okay and ended the call

## 2019-08-16 NOTE — Progress Notes (Signed)
P  Chief Complaint:    GERD, regurgitation  GI History: Victoria Moore is a 43 y.o. female  with a history of reflux, starting in 2018, along with dysphagia starting in 2019. Index sxs HB, regurgitation with infrequent raspy voice and dry cough.    Sleeps in a recliner at night due to regurgitation and waterbrash.  She has trialed multiple acid suppressants/antacids. Zantac previously worked but eventually lost efficacy.  No benefit with OTC Nexium. Protonix 40 mg/day initially worked, but again lost efficacy and she increased to TID without improvement at all.  Changed to Dexilant 60 mg/day, which worked for about a month, then lost efficacy.  She has since reverted back to Nexium 60 mg/day, but again with breakthrough symptoms of heartburn, regurgitation, sour brash. Worse with supine and post prandial.   Dysphagia occurs intermittently, solids > liquids, pointing to mid chest.  No history of food impaction.  No changes in weight and no fever, chills, night sweats.    She has had infrequent episodes of nausea/vomiting, with one episode of blood-tinged emesis recently.  Additionally, she has a history of iron deficiency anemia dating back to 2015 with iron 10 and ferritin 3 at that time.  Hemoglobin as low as 6.8 in 2015, requiring admission with pRBC transfusion. Hysterectomy for menorrhagia and fibroids in March 2017, with overall improvement in baseline hemoglobin and iron indices. Peak of 12.4 in November 2018, and has mildly down trended since that time to 10.8 in 08/2017 despite ongoing oral iron therapy.  Hgb range 10.8-12.7 since hysterectomy. MCV improved from 02/2016 (75) to stable mid 80's.  Iron indices improved, but still requiring BID ferrous sulfate.  No overt GIB.   EGD in 09/2018 unrevealing.  06/2019: H/H 11.6/38.3 with MCV 88. 07/2018: Iron 46, TIBC 360, ferritin 10, iron sat 13%.  Vitamin D 20.  Normal B12/folate  Endoscopic history: -EGD (09/2018, Dr. Bryan Lemma): Schatzki's  ring, dilated with 110 Fr (no ring) then fracture with forceps, normal Z line, Hill grade 2 valve, mild non-H. pylori gastritis, normal duodenum  HPI:     Patient is a 43 y.o. female presenting to the [facility]ology [facility] for follow-up. States she continues to have reflux sxs- HB, regurgitation. Trialed Dexliant which worked for a month or so, then progressive return of index sxs. Resumed Nexium, which again worked for a couple weeks, then lost efficacy. Now taking Nexium 60 mg/day and Alka Seltzer daily for breakthrough sxs. Still with intermittent solid food dysphagia, but no food impaction.   Is due for routine CRC screening.  No hematochezia.  No known family history of CRC.  Does endorse intermittent rectal discomfort.  None currently.  Otherwise no lower GI symptoms.  No appreciable improvement with Dexilant or Nexium.  Having emesis with just water.  Review of systems:     No chest pain, no SOB, no fevers, no urinary sx   Past Medical History:  Diagnosis Date  . ADHD   . Allergy   . Anemia   . Anxiety   . Arthritis   . Asthma   . Blood transfusion without reported diagnosis   . Chronic kidney disease   . Common migraine with intractable migraine 10/07/2016  . Depression   . Diabetes ([place])   . Edema   . GERD (gastroesophageal reflux disease)   . Herniated lumbar intervertebral disc   . Hilar adenopathy   . Hypertension   . Iron deficiency   . Migraine   . Prolonged capillary refill  time   . Sarcoidosis    personal history of  . Seizures ([place])    history of   . Sleep apnea   . Spondylosis   . Vitamin D deficiency     Patient's surgical history, family medical history, social history, medications and allergies were all reviewed in Epic    Current Outpatient Medications  Medication Sig Dispense Refill  . albuterol (PROVENTIL) (2.5 MG/3ML) 0.083% nebulizer solution Take 2.5 mg by nebulization every 6 (six) hours as needed for wheezing or shortness of breath.    Marland Kitchen  albuterol (VENTOLIN HFA) 108 (90 Base) MCG/ACT inhaler Inhale 1-2 puffs into the lungs every 4 (four) hours as needed for wheezing or shortness of breath. 8 g 3  . ALPRAZolam (XANAX) 1 MG tablet Take 0.5-1 tablets (0.5-1 mg total) by mouth at bedtime as needed for anxiety. 30 tablet 0  . amphetamine-dextroamphetamine (ADDERALL XR) 30 MG 24 hr capsule Take 30 mg by mouth daily as needed (ADHD).     Marland Kitchen baclofen 5 MG TABS Take 10 mg by mouth 2 (two) times daily as needed for up to 14 days for muscle spasms. 30 tablet 0  . chlorthalidone (HYGROTON) 25 MG tablet Take 1 tablet (25 mg total) by mouth daily. 30 tablet 3  . ferrous sulfate 325 (65 FE) MG tablet Take 325 mg by mouth daily with breakfast.    . mometasone-formoterol (DULERA) 100-5 MCG/ACT AERO Inhale 2 puffs into the lungs 2 (two) times daily. 13 g 3  . pregabalin (LYRICA) 50 MG capsule Take 1 capsule (50 mg total) by mouth 3 (three) times daily. 90 capsule 0  . spironolactone (ALDACTONE) 25 MG tablet Take 1 tablet (25 mg total) by mouth daily. 30 tablet 3   No current facility-administered medications for this visit.     Physical Exam:     BP (!) 146/100   Pulse 71   Temp 98.5 F (36.9 C)   Ht 5\' 9"  (1.753 m)   Wt (!) 315 lb 6 oz (143.1 kg)   LMP 02/19/2016   BMI 46.57 kg/m   GENERAL:  Pleasant female in NAD PSYCH: : Cooperative, normal affect EENT:  conjunctiva pink, mucous membranes moist, neck supple without masses CARDIAC:  RRR, no murmur heard, no peripheral edema PULM: Normal respiratory effort, lungs CTA bilaterally, no wheezing ABDOMEN:  Nondistended, soft, nontender. No obvious masses, no hepatomegaly,  normal bowel sounds SKIN:  turgor, no lesions seen Musculoskeletal:  Normal muscle tone, normal strength NEURO: Alert and oriented x 3, no focal neurologic deficits   IMPRESSION and PLAN:   .   1) GERD 2) Dysphagia 3) Dyspepsia  -EGD was otherwise largely unrevealing, with normal Z line, Hill grade 2 valve, no  HH, no erosive esophagitis.  However, continues to have daily reflux symptoms despite high-dose acid suppression therapy.  She is very interested in antireflux surgical options. - Esophageal Manometry and pH/impedance testing off PPI therapy x5 days - Discussed antireflux surgical options.  Given her elevated BMI (46), my recommendation would be for referral to Bariatric Surgery for bypass rather than TIF -Resume antireflux lifestyle modifications - Can resume PPI therapy for now, with hold prior to pH/impedance - Pending studies, can also trial FD guard, peppermint oil, etc.  4) CRC screening: -Due for age-appropriate, average risk CRC screening -Schedule colonoscopy  5) Iron deficiency: - Recent H/H and MCV now stable - Repeat iron panel as previously ordered -Can certainly evaluate for alternative sources of iron deficiency at time  of colonoscopy as above  The indications, risks, and benefits of colonoscopy were explained to the patient in detail. Risks include but are not limited to bleeding, perforation, adverse reaction to medications, and cardiopulmonary compromise. Sequelae include but are not limited to the possibility of surgery, hospitalization, and mortality. The patient verbalized understanding and wished to proceed. All questions answered, referred to the scheduler and bowel prep ordered. Further recommendations pending results of the exam.    Lavena Bullion ,DO, FACG 08/16/2019, 2:34 PM

## 2019-08-16 NOTE — Patient Instructions (Addendum)
If you are age 45 or older, your body mass index should be between 23-30. Your Body mass index is 46.57 kg/m. If this is out of the aforementioned range listed, please consider follow up with your Primary Care Provider.  If you are age 78 or younger, your body mass index should be between 19-25. Your Body mass index is 46.57 kg/m. If this is out of the aformentioned range listed, please consider follow up with your Primary Care Provider.   To help prevent the possible spread of infection to our patients, communities, and staff; we will be implementing the following measures:  As of now we are not allowing any visitors/family members to accompany you to any upcoming appointments with [facility]. If you have any concerns about this please contact our office to discuss prior to the appointment.   You have been scheduled for a colonoscopy. Please follow written instructions given to you at your visit today.  Please pick up your prep supplies at the pharmacy within the next 1-3 days. If you use inhalers (even only as needed), please bring them with you on the day of your procedure. Your physician has requested that you go to www.startemmi.com and enter the access code given to you at your visit today. This web site gives a general overview about your procedure. However, you should still follow specific instructions given to you by our office regarding your preparation for the procedure.  You have been scheduled for an esophageal manometry at [facility] on 09/05/2019 at 9:00am. Please arrive 30 minutes prior to your procedure for registration. You will need to go to outpatient registration (1st floor of the hospital) first. Make certain to bring your insurance cards as well as a complete list of medications.  Please remember the following:  1) Do not take any muscle relaxants, xanax (alprazolam) or ativan for 1 day prior to your test as well as the day of the test.  2) Nothing to  eat or drink for 4 hours before your test.  3) Hold all diabetic medications/insulin the morning of the test. You may eat and take your medications after the test.  It will take at least 2 weeks to receive the results of this test from your physician. ------------------------------------------ ABOUT ESOPHAGEAL MANOMETRY Esophageal manometry (muh-NOM-uh-tree) is a test that gauges how well your esophagus works. Your esophagus is the long, muscular tube that connects your throat to your stomach. Esophageal manometry measures the rhythmic muscle contractions (peristalsis) that occur in your esophagus when you swallow. Esophageal manometry also measures the coordination and force exerted by the muscles of your esophagus.  During esophageal manometry, a thin, flexible tube (catheter) that contains sensors is passed through your nose, down your esophagus and into your stomach. Esophageal manometry can be helpful in diagnosing some mostly uncommon disorders that affect your esophagus.  Why it's done Esophageal manometry is used to evaluate the movement (motility) of food through the esophagus and into the stomach. The test measures how well the circular bands of muscle (sphincters) at the top and bottom of your esophagus open and close, as well as the pressure, strength and pattern of the wave of esophageal muscle contractions that moves food along.  What you can expect Esophageal manometry is an outpatient procedure done without sedation. Most people tolerate it well. You may be asked to change into a hospital gown before the test starts.  During esophageal manometry  . While you are sitting up, a member of your health  care team sprays your throat with a numbing medication or puts numbing gel in your nose or both.  . A catheter is guided through your nose into your esophagus. The catheter may be sheathed in a water-filled sleeve. It doesn't interfere with your breathing. However, your eyes may water, and  you may gag. You may have a slight nosebleed from irritation.  . After the catheter is in place, you may be asked to lie on your back on an exam table, or you may be asked to remain seated.  . You then swallow small sips of water. As you do, a computer connected to the catheter records the pressure, strength and pattern of your esophageal muscle contractions.  . During the test, you'll be asked to breathe slowly and smoothly, remain as still as possible, and swallow only when you're asked to do so.  . A member of your health care team may move the catheter down into your stomach while the catheter continues its measurements.  . The catheter then is slowly withdrawn. The test usually lasts 20 to 30 minutes.  After esophageal manometry  When your esophageal manometry is complete, you may return to your normal activities  This test typically takes 30-45 minutes to complete. ________________________________________________________________________________  It was a pleasure to see you today!  Vito Cirigliano, D.O.

## 2019-08-16 NOTE — Telephone Encounter (Signed)
Fwd to Raymond since she is pts PCP

## 2019-08-17 ENCOUNTER — Telehealth: Payer: Self-pay | Admitting: Gastroenterology

## 2019-08-17 ENCOUNTER — Encounter: Payer: Self-pay | Admitting: Gastroenterology

## 2019-08-17 LAB — DRUG SCREEN URINE W/ALC, NO CONF

## 2019-08-17 NOTE — Telephone Encounter (Signed)
Please review additional documentation concerning this patient- 

## 2019-08-17 NOTE — Telephone Encounter (Signed)
Called and spoke with patient-patient informed that she would need an appt for her COVID screening pre procedure -appt has been scheduled on 09/01/2019 at 9:40 am; patient given instructions for her COVID screening and esophageal manometry via MyChart and mailed to patient; Patient has also requested a work note be sent via [place] for her to have from [place] on 08/16/2019; work note sent through [employer] and mailed (for patient records) to patient; patient advised to call back should questions/concerns arise; patient verbalized understanding of information/instructions;

## 2019-08-17 NOTE — Telephone Encounter (Signed)
Patient answered "NO" to all covid-19 questions °

## 2019-08-17 NOTE — Telephone Encounter (Signed)
Spoke with patient and discussed Victoria Moore's recommendations. Patient state she had not been to ED yet but was planning to go this morning. States she is still have slight chest pain. Informed patient we are still awaiting drug screening results and will notify her as soon as those results are in.

## 2019-08-17 NOTE — Telephone Encounter (Signed)

## 2019-08-17 NOTE — Telephone Encounter (Signed)
She also has a question about getting a COVID test prior to her manometry scheduled on 09-05-2019. Does she need another one?

## 2019-08-20 ENCOUNTER — Encounter: Payer: Self-pay | Admitting: *Deleted

## 2019-08-20 ENCOUNTER — Encounter: Payer: PRIVATE HEALTH INSURANCE | Admitting: Gastroenterology

## 2019-08-20 ENCOUNTER — Other Ambulatory Visit: Payer: Self-pay

## 2019-08-20 ENCOUNTER — Ambulatory Visit: Payer: PRIVATE HEALTH INSURANCE | Admitting: Gastroenterology

## 2019-08-20 VITALS — Temp 98.4°F

## 2019-08-20 NOTE — Progress Notes (Signed)
Victoria Moore  Vital signs-courtney [place]   Pt. Reported to [place] that she was still having mixture of stools alternating in diarrhea and soft stools made doctor aware instructed to reschedule pt. With 2 day prep.

## 2019-08-21 ENCOUNTER — Ambulatory Visit: Payer: PRIVATE HEALTH INSURANCE | Admitting: Orthopaedic Surgery

## 2019-08-21 ENCOUNTER — Other Ambulatory Visit: Payer: Self-pay

## 2019-08-22 ENCOUNTER — Other Ambulatory Visit: Payer: Self-pay

## 2019-08-22 ENCOUNTER — Other Ambulatory Visit: Payer: PRIVATE HEALTH INSURANCE

## 2019-08-23 ENCOUNTER — Other Ambulatory Visit: Payer: Self-pay

## 2019-08-23 LAB — DRUG SCREEN URINE W/ALC, NO CONF
ALCOHOL, ETHYL (U): NEGATIVE
AMPHETAMINES (1000 ng/mL SCRN): NEGATIVE
BARBITURATES: NEGATIVE
BENZODIAZEPINES: NEGATIVE
COCAINE METABOLITES: NEGATIVE
MARIJUANA MET (50 ng/mL SCRN): NEGATIVE
METHADONE: NEGATIVE
METHAQUALONE: NEGATIVE
OPIATES: NEGATIVE
PHENCYCLIDINE: NEGATIVE
PROPOXYPHENE: NEGATIVE

## 2019-08-23 MED ORDER — AMPHETAMINE-DEXTROAMPHET ER 30 MG PO CP24: 30.0000 mg | ORAL_CAPSULE | Freq: Every day | ORAL | 0 refills | Status: DC | PRN

## 2019-08-23 NOTE — Addendum Note (Signed)
Addended by: Ruthell Rummage A on: 08/23/2019 11:54 AM   Modules accepted: Orders

## 2019-08-23 NOTE — Addendum Note (Signed)
Addended by: Ruthell Rummage A on: 08/23/2019 01:07 PM   Modules accepted: Orders

## 2019-08-24 LAB — CBC WITH DIFFERENTIAL/PLATELET
Absolute Monocytes: 405 cells/uL (ref 200–950)
Basophils Absolute: 20 cells/uL (ref 0–200)
Basophils Relative: 0.4 %
Eosinophils Absolute: 210 cells/uL (ref 15–500)
Eosinophils Relative: 4.2 %
HCT: 36.1 % (ref 35.0–45.0)
Hemoglobin: 11.6 g/dL — ABNORMAL LOW (ref 11.7–15.5)
Lymphs Abs: 1250 cells/uL (ref 850–3900)
MCH: 27.2 pg (ref 27.0–33.0)
MCHC: 32.1 g/dL (ref 32.0–36.0)
MCV: 84.5 fL (ref 80.0–100.0)
MPV: 10.9 fL (ref 7.5–12.5)
Monocytes Relative: 8.1 %
Neutro Abs: 3115 cells/uL (ref 1500–7800)
Neutrophils Relative %: 62.3 %
Platelets: 240 10*3/uL (ref 140–400)
RBC: 4.27 10*6/uL (ref 3.80–5.10)
RDW: 13.6 % (ref 11.0–15.0)
Total Lymphocyte: 25 %
WBC: 5 10*3/uL (ref 3.8–10.8)

## 2019-08-24 LAB — COMPLETE METABOLIC PANEL WITH GFR
AG Ratio: 1.5 (calc) (ref 1.0–2.5)
ALT: 10 U/L (ref 6–29)
AST: 13 U/L (ref 10–35)
Albumin: 4.1 g/dL (ref 3.6–5.1)
Alkaline phosphatase (APISO): 52 U/L (ref 31–125)
BUN: 11 mg/dL (ref 7–25)
CO2: 25 mmol/L (ref 20–32)
Calcium: 9 mg/dL (ref 8.6–10.2)
Chloride: 104 mmol/L (ref 98–110)
Creat: 0.96 mg/dL (ref 0.50–1.10)
GFR, Est African American: 83 mL/min/{1.73_m2} (ref >=60)
GFR, Est Non African American: 71 mL/min/{1.73_m2} (ref >=60)
Globulin: 2.7 g/dL (calc) (ref 1.9–3.7)
Glucose, Bld: 120 mg/dL — ABNORMAL HIGH (ref 65–99)
Potassium: 4.3 mmol/L (ref 3.5–5.3)
Sodium: 138 mmol/L (ref 135–146)
Total Bilirubin: 0.2 mg/dL (ref 0.2–1.2)
Total Protein: 6.8 g/dL (ref 6.1–8.1)

## 2019-08-24 LAB — TSH: TSH: 1.21 mIU/L

## 2019-08-24 LAB — HEMOGLOBIN A1C
Hgb A1c MFr Bld: 6.1 % of total Hgb — ABNORMAL HIGH (ref <5.7)
Mean Plasma Glucose: 128 (calc)
eAG (mmol/L): 7.1 (calc)

## 2019-08-24 LAB — TEST AUTHORIZATION

## 2019-08-24 LAB — LIPID PANEL
Cholesterol: 192 mg/dL (ref <200)
HDL: 53 mg/dL (ref >=50)
LDL Cholesterol (Calc): 118 mg/dL (calc) — ABNORMAL HIGH
Non-HDL Cholesterol (Calc): 139 mg/dL (calc) — ABNORMAL HIGH (ref <130)
Total CHOL/HDL Ratio: 3.6 (calc) (ref <5.0)
Triglycerides: 105 mg/dL (ref <150)

## 2019-08-24 LAB — RHEUMATOID FACTOR: Rheumatoid fact SerPl-aCnc: <14 IU/mL (ref <14)

## 2019-08-24 LAB — SEDIMENTATION RATE: Sed Rate: 19 mm/h (ref 0–20)

## 2019-08-27 ENCOUNTER — Telehealth: Payer: Self-pay

## 2019-08-27 ENCOUNTER — Ambulatory Visit: Payer: PRIVATE HEALTH INSURANCE | Admitting: Internal Medicine

## 2019-08-27 NOTE — Telephone Encounter (Signed)
Covid-19 screening questions   Do you now or have you had a fever in the last 14 days?  Do you have any respiratory symptoms of shortness of breath or cough now or in the last 14 days?  Do you have any family members or close contacts with diagnosed or suspected Covid-19 in the past 14 days?  Have you been tested for Covid-19 and found to be positive?       

## 2019-08-28 ENCOUNTER — Encounter: Payer: Self-pay | Admitting: Gastroenterology

## 2019-08-28 ENCOUNTER — Ambulatory Visit (AMBULATORY_SURGERY_CENTER): Payer: PRIVATE HEALTH INSURANCE | Admitting: Gastroenterology

## 2019-08-28 ENCOUNTER — Other Ambulatory Visit: Payer: Self-pay

## 2019-08-28 VITALS — BP 116/78 | HR 72 | Temp 98.7°F | Resp 21 | Ht 69.0 in | Wt 315.0 lb

## 2019-08-28 DIAGNOSIS — D129 Benign neoplasm of anus and anal canal: Secondary | ICD-10-CM

## 2019-08-28 DIAGNOSIS — K621 Rectal polyp: Secondary | ICD-10-CM | POA: Diagnosis not present

## 2019-08-28 DIAGNOSIS — K635 Polyp of colon: Secondary | ICD-10-CM

## 2019-08-28 DIAGNOSIS — Z1211 Encounter for screening for malignant neoplasm of colon: Secondary | ICD-10-CM | POA: Diagnosis not present

## 2019-08-28 DIAGNOSIS — D128 Benign neoplasm of rectum: Secondary | ICD-10-CM

## 2019-08-28 DIAGNOSIS — K640 First degree hemorrhoids: Secondary | ICD-10-CM

## 2019-08-28 DIAGNOSIS — D125 Benign neoplasm of sigmoid colon: Secondary | ICD-10-CM

## 2019-08-28 MED ORDER — SODIUM CHLORIDE 0.9 % IV SOLN: 500.0000 mL | Freq: Once | INTRAVENOUS | Status: DC

## 2019-08-28 NOTE — Progress Notes (Signed)
Report to PACU, RN, vss, BBS= Clear.  

## 2019-08-28 NOTE — Patient Instructions (Signed)
YOU HAD AN ENDOSCOPIC PROCEDURE TODAY AT [facility]:   Refer to the procedure report that was given to you for any specific questions about what was found during the examination.  If the procedure report does not answer your questions, please call your gastroenterologist to clarify.  If you requested that your care partner not be given the details of your procedure findings, then the procedure report has been included in a sealed envelope for you to review at your convenience later.  YOU SHOULD EXPECT: Some feelings of bloating in the abdomen. Passage of more gas than usual.  Walking can help get rid of the air that was put into your GI tract during the procedure and reduce the bloating. If you had a lower endoscopy (such as a colonoscopy or flexible sigmoidoscopy) you may notice spotting of blood in your stool or on the toilet paper. If you underwent a bowel prep for your procedure, you may not have a normal bowel movement for a few days.  Please Note:  You might notice some irritation and congestion in your nose or some drainage.  This is from the oxygen used during your procedure.  There is no need for concern and it should clear up in a day or so.  SYMPTOMS TO REPORT IMMEDIATELY:   Following lower endoscopy (colonoscopy or flexible sigmoidoscopy):  Excessive amounts of blood in the stool  Significant tenderness or worsening of abdominal pains  Swelling of the abdomen that is new, acute  Fever of 100F or higher  Please see handouts given to you on Polyps and Hemorrhoids.  Fiber supplement daily, for example Citrucel, Fibercon or Metamucil. For urgent or emergent issues, a gastroenterologist can be reached at any hour by calling [phone].   DIET:  We do recommend a small meal at first, but then you may proceed to your regular diet.  Drink plenty of fluids but you should avoid alcoholic beverages for 24 hours.  ACTIVITY:  You should plan to take it easy for the rest  of today and you should NOT DRIVE or use heavy machinery until tomorrow (because of the sedation medicines used during the test).    FOLLOW UP: Our staff will call the number listed on your records 48-72 hours following your procedure to check on you and address any questions or concerns that you may have regarding the information given to you following your procedure. If we do not reach you, we will leave a message.  We will attempt to reach you two times.  During this call, we will ask if you have developed any symptoms of COVID 19. If you develop any symptoms (ie: fever, flu-like symptoms, shortness of breath, cough etc.) before then, please call [phone].  If you test positive for Covid 19 in the 2 weeks post procedure, please call and report this information to [place].    If any biopsies were taken you will be contacted by phone or by letter within the next 1-3 weeks.  Please call us at [phone] if you have not heard about the biopsies in 3 weeks.    SIGNATURES/CONFIDENTIALITY: You and/or your care partner have signed paperwork which will be entered into your electronic medical record.  These signatures attest to the fact that that the information above on your After Visit Summary has been reviewed and is understood.  Full responsibility of the confidentiality of this discharge information lies with you and/or your care-partner.  Thank you for letting us take care  of your healthcare needs today.

## 2019-08-28 NOTE — Op Note (Signed)
[facility] Patient Name: Victoria Moore Procedure Date: 08/28/2019 3:38 PM MRN: [id number] Endoscopist: Gerrit Heck , MD Age: 43 Referring MD:  Date of Birth: 1976/04/17 Gender: Female Account #: [id number] Procedure:                Colonoscopy Indications:              Screening for colorectal malignant neoplasm Medicines:                Monitored Anesthesia Care Procedure:                Pre-Anesthesia Assessment:                           - Prior to the procedure, a History and Physical                            was performed, and patient medications and                            allergies were reviewed. The patient's tolerance of                            previous anesthesia was also reviewed. The risks                            and benefits of the procedure and the sedation                            options and risks were discussed with the patient.                            All questions were answered, and informed consent                            was obtained. Prior Anticoagulants: The patient has                            taken no previous anticoagulant or antiplatelet                            agents. ASA Grade Assessment: III - A patient with                            severe systemic disease. After reviewing the risks                            and benefits, the patient was deemed in                            satisfactory condition to undergo the procedure.                           After obtaining informed consent, the colonoscope  was passed under direct vision. Throughout the                            procedure, the patient's blood pressure, pulse, and                            oxygen saturations were monitored continuously. The                            Colonoscope was introduced through the anus and                            advanced to the the terminal ileum. The colonoscopy                            was performed  without difficulty. The patient                            tolerated the procedure well. The quality of the                            bowel preparation was adequate. The terminal ileum,                            ileocecal valve, appendiceal orifice, and rectum                            were photographed. Scope In: 3:43:52 PM Scope Out: 4:01:37 PM Scope Withdrawal Time: 0 hours 10 minutes 20 seconds  Total Procedure Duration: 0 hours 17 minutes 45 seconds  Findings:                 The perianal and digital rectal examinations were                            normal.                           Four sessile polyps were found in the sigmoid                            colon. The polyps were 2 to 3 mm in size. These                            polyps were removed with a cold biopsy forceps.                            Resection and retrieval were complete. Estimated                            blood loss was minimal.                           Two sessile polyps were found in the rectum. The  polyps were 1 to 2 mm in size. These polyps were                            removed with a cold biopsy forceps. Resection and                            retrieval were complete. Estimated blood loss was                            minimal.                           Non-bleeding internal hemorrhoids were found during                            retroflexion. The hemorrhoids were small.                           The exam was otherwise normal throughout the                            examined colon.                           The terminal ileum appeared normal. Complications:            No immediate complications. Estimated Blood Loss:     Estimated blood loss was minimal. Impression:               - Four 2 to 3 mm polyps in the sigmoid colon,                            removed with a cold biopsy forceps. Resected and                            retrieved.                           - Two  1 to 2 mm polyps in the rectum, removed with                            a cold biopsy forceps. Resected and retrieved.                           - Non-bleeding internal hemorrhoids.                           - The examined portion of the ileum was normal. Recommendation:           - Patient has a contact number available for                            emergencies. The signs and symptoms of potential                            delayed  complications were discussed with the                            patient. Return to normal activities tomorrow.                            Written discharge instructions were provided to the                            patient.                           - Resume previous diet today.                           - Continue present medications.                           - Await pathology results.                           - Repeat colonoscopy for surveillance based on                            pathology results.                           - Return to GI clinic PRN.                           - Use fiber, for example Citrucel, Fibercon, Konsyl                            or Metamucil. Gerrit Heck, MD 08/28/2019 4:08:29 PM

## 2019-08-28 NOTE — Progress Notes (Signed)
Called to room to assist during endoscopic procedure.  Patient ID and intended procedure confirmed with present staff. Received instructions for my participation in the procedure from the performing physician.  

## 2019-08-28 NOTE — Progress Notes (Signed)
Pt's states no medical or surgical changes since previsit or office visit.  Temp- June Vitals- Courtney 

## 2019-08-28 NOTE — Progress Notes (Signed)
CBG intially 78 in admission. OJ given and graham crackers and PB. CBG 73, more OJ an d graham crackers given, CBG up to 76. With no symptoms of Hypoglycemia.

## 2019-08-30 ENCOUNTER — Telehealth: Payer: Self-pay

## 2019-08-30 NOTE — Telephone Encounter (Signed)
  Follow up Call-  Call back number 08/28/2019 08/20/2019 09/26/2018  Post procedure Call Back phone  # [phone]  Permission to leave phone message Yes Yes Yes  Some recent data might be hidden     Patient questions:  Do you have a fever, pain , or abdominal swelling? No. Pain Score  0 *  Have you tolerated food without any problems? Yes.    Have you been able to return to your normal activities? Yes.    Do you have any questions about your discharge instructions: Diet   No. Medications  No. Follow up visit  No.  Do you have questions or concerns about your Care? No.  Actions: * If pain score is 4 or above: No action needed, pain <4. 1. Have you developed a fever since your procedure? no  2.   Have you had an respiratory symptoms (SOB or cough) since your procedure? no  3.   Have you tested positive for COVID 19 since your procedure no  4.   Have you had any family members/close contacts diagnosed with the COVID 19 since your procedure?  no   If yes to any of these questions please route to Joylene John, RN and Alphonsa Gin, Therapist, sports.

## 2019-08-30 NOTE — Telephone Encounter (Signed)
First attempt follow up call to pt, LM on vm.

## 2019-08-31 ENCOUNTER — Ambulatory Visit: Payer: PRIVATE HEALTH INSURANCE | Admitting: Internal Medicine

## 2019-09-01 ENCOUNTER — Other Ambulatory Visit (HOSPITAL_COMMUNITY)
Admission: RE | Admit: 2019-09-01 | Discharge: 2019-09-01 | Disposition: A | Discharge status: Home / Self Care | Payer: PRIVATE HEALTH INSURANCE | Source: Ambulatory Visit | Attending: Gastroenterology | Admitting: Gastroenterology

## 2019-09-01 DIAGNOSIS — Z20828 Contact with and (suspected) exposure to other viral communicable diseases: Secondary | ICD-10-CM | POA: Diagnosis not present

## 2019-09-01 DIAGNOSIS — Z01812 Encounter for preprocedural laboratory examination: Secondary | ICD-10-CM | POA: Insufficient documentation

## 2019-09-02 LAB — NOVEL CORONAVIRUS, NAA (HOSP ORDER, SEND-OUT TO REF LAB; TAT 18-24 HRS): SARS-CoV-2, NAA: NOT DETECTED

## 2019-09-03 ENCOUNTER — Other Ambulatory Visit: Payer: Self-pay

## 2019-09-03 ENCOUNTER — Encounter (HOSPITAL_COMMUNITY): Payer: Self-pay | Admitting: Emergency Medicine

## 2019-09-03 ENCOUNTER — Emergency Department (HOSPITAL_COMMUNITY)
Admission: EM | Admit: 2019-09-03 | Discharge: 2019-09-03 | Disposition: A | Discharge status: Home / Self Care | Payer: PRIVATE HEALTH INSURANCE | Attending: Emergency Medicine | Admitting: Emergency Medicine

## 2019-09-03 DIAGNOSIS — Z87891 Personal history of nicotine dependence: Secondary | ICD-10-CM | POA: Insufficient documentation

## 2019-09-03 DIAGNOSIS — R198 Other specified symptoms and signs involving the digestive system and abdomen: Secondary | ICD-10-CM

## 2019-09-03 DIAGNOSIS — E1122 Type 2 diabetes mellitus with diabetic chronic kidney disease: Secondary | ICD-10-CM | POA: Diagnosis not present

## 2019-09-03 DIAGNOSIS — J45909 Unspecified asthma, uncomplicated: Secondary | ICD-10-CM | POA: Diagnosis not present

## 2019-09-03 DIAGNOSIS — Z886 Allergy status to analgesic agent status: Secondary | ICD-10-CM | POA: Diagnosis not present

## 2019-09-03 DIAGNOSIS — R0989 Other specified symptoms and signs involving the circulatory and respiratory systems: Secondary | ICD-10-CM | POA: Insufficient documentation

## 2019-09-03 DIAGNOSIS — I129 Hypertensive chronic kidney disease with stage 1 through stage 4 chronic kidney disease, or unspecified chronic kidney disease: Secondary | ICD-10-CM | POA: Diagnosis not present

## 2019-09-03 DIAGNOSIS — R221 Localized swelling, mass and lump, neck: Secondary | ICD-10-CM | POA: Diagnosis present

## 2019-09-03 DIAGNOSIS — N189 Chronic kidney disease, unspecified: Secondary | ICD-10-CM | POA: Insufficient documentation

## 2019-09-03 DIAGNOSIS — Z7984 Long term (current) use of oral hypoglycemic drugs: Secondary | ICD-10-CM | POA: Insufficient documentation

## 2019-09-03 DIAGNOSIS — Z79899 Other long term (current) drug therapy: Secondary | ICD-10-CM | POA: Diagnosis not present

## 2019-09-03 MED ORDER — DEXAMETHASONE 4 MG PO TABS
10.0000 mg | ORAL_TABLET | Freq: Once | ORAL | Status: AC
  Administered 2019-09-03: 10 mg via ORAL
  Filled 2019-09-03 (×2): qty 2

## 2019-09-03 MED ORDER — LIDOCAINE VISCOUS HCL 2 % MT SOLN
15.0000 mL | Freq: Once | OROMUCOSAL | Status: AC
  Administered 2019-09-03: 15 mL via ORAL
  Filled 2019-09-03: qty 15

## 2019-09-03 MED ORDER — ALUM & MAG HYDROXIDE-SIMETH 200-200-20 MG/5ML PO SUSP
30.0000 mL | Freq: Once | ORAL | Status: AC
  Administered 2019-09-03: 30 mL via ORAL
  Filled 2019-09-03: qty 30

## 2019-09-03 NOTE — ED Provider Notes (Signed)
[facility] DEPT Provider Note   CSN: [id number] Arrival date & time: 09/03/19  2059     History   Chief Complaint Chief Complaint  Patient presents with  . neck swelling    HPI Victoria Moore is a 43 y.o. female.     43 yo F with a chief complaints of throat swelling.  She felt this started earlier this evening.  She was sitting and watching TV when it started.  Denies rash denies cough denies shortness of breath denies fever.  Denies overt sore throat.  She just feels that the throat is swelling on her and she has trouble taking a breath.  Never had this happen to her before.  Has a history of allergy to aspirin but denies other allergies.  She denies tongue or lip swelling.  The history is provided by the patient.  Illness Severity:  Mild Onset quality:  Gradual Duration:  4 hours Timing:  Constant Progression:  Worsening Chronicity:  New Associated symptoms: no chest pain, no congestion, no fever, no headaches, no myalgias, no nausea, no rhinorrhea, no shortness of breath, no vomiting and no wheezing     Past Medical History:  Diagnosis Date  . ADHD   . Allergy   . Anemia   . Anxiety   . Arthritis   . Asthma   . Blood transfusion without reported diagnosis   . Chronic kidney disease   . Common migraine with intractable migraine 10/07/2016  . Depression   . Diabetes ([place])   . Edema   . GERD (gastroesophageal reflux disease)   . Herniated lumbar intervertebral disc   . Hilar adenopathy   . Hypertension   . Iron deficiency   . Migraine   . Prolonged capillary refill time   . Sarcoidosis    personal history of  . Seizures ([place])    history of   . Sleep apnea   . Spondylosis   . Vitamin D deficiency     Patient Active Problem List   Diagnosis Date Noted  . Leg pain 12/07/2018  . Low libido 11/27/2018  . Prolonged capillary refill time 10/04/2018  . Decreased pedal pulses 10/04/2018  . Spondylosis 09/15/2018  . Chronic  low back pain with sciatica 09/15/2018  . Impaired fasting blood sugar 09/11/2018  . Weight gain 09/11/2018  . Edema 09/11/2018  . Frequency of urination 09/11/2018  . Right leg swelling 08/18/2018  . Mild sleep apnea 08/03/2018  . Hypokalemia 06/28/2018  . Medication side effect 06/22/2018  . Acute low back pain without sciatica 06/22/2018  . Personal history of sarcoidosis 05/26/2018  . Vitamin D deficiency 05/26/2018  . Encounter for health maintenance examination with abnormal findings 05/26/2018  . Depression, major, single episode, moderate ([place]) 05/26/2018  . Urinary tract infection without hematuria 05/26/2018  . Daytime sleepiness 05/26/2018  . Chest wall pain 05/26/2018  . GERD (gastroesophageal reflux disease) 05/26/2018  . Essential hypertension, benign 11/15/2016  . 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 Morbid (severe) obesity due to excess calories ([place]) 10/23/2016  . Cough variant asthma vs UACS  10/23/2016  . Common migraine with intractable migraine 10/07/2016  . Dyspnea 04/15/2016  . Hilar adenopathy 04/15/2016  . Fibroids 03/11/2016  . Anemia 11/12/2014  . Iron deficiency 11/12/2014    Past Surgical History:  Procedure Laterality Date  . ABDOMINAL HYSTERECTOMY    . LAPAROSCOPIC OVARIAN CYSTECTOMY Left 03/11/2016   Procedure: LAPAROSCOPIC OVARIAN CYSTECTOMY;  Surgeon: Eldred Manges, MD;  Location:  [facility];  Service: Gynecology;  Laterality: Left;  . LAPAROSCOPIC VAGINAL HYSTERECTOMY WITH SALPINGECTOMY Bilateral 03/11/2016   Procedure: LAPAROSCOPIC ASSISTED VAGINAL HYSTERECTOMY WITH SALPINGECTOMY;  Surgeon: Eldred Manges, MD;  Location: [facility];  Service: Gynecology;  Laterality: Bilateral;  . TUBAL LIGATION       OB History    Gravida  1   Para      Term      Preterm      AB      Living        SAB      TAB      Ectopic      Multiple      Live Births               Home Medications    Prior to Admission medications    Medication Sig Start Date End Date Taking? Authorizing Provider  albuterol (PROVENTIL) (2.5 MG/3ML) 0.083% nebulizer solution Take 2.5 mg by nebulization every 6 (six) hours as needed for wheezing or shortness of breath.   Yes [provider]  albuterol (VENTOLIN HFA) 108 (90 Base) MCG/ACT inhaler Inhale 1-2 puffs into the lungs every 4 (four) hours as needed for wheezing or shortness of breath. 08/10/19  Yes Ngetich, Dinah C, NP  ALPRAZolam (XANAX) 1 MG tablet Take 0.5-1 tablets (0.5-1 mg total) by mouth at bedtime as needed for anxiety. 08/10/19  Yes Ngetich, Dinah C, NP  amphetamine-dextroamphetamine (ADDERALL XR) 30 MG 24 hr capsule Take 1 capsule (30 mg total) by mouth daily as needed (ADHD). 08/23/19  Yes Ngetich, Dinah C, NP  chlorthalidone (HYGROTON) 25 MG tablet Take 1 tablet (25 mg total) by mouth daily. 08/10/19  Yes Ngetich, Dinah C, NP  ferrous sulfate 325 (65 FE) MG tablet Take 325 mg by mouth daily with breakfast.   Yes [provider]  metFORMIN (GLUCOPHAGE) 500 MG tablet Take 1 tablet by mouth daily.    Yes [provider]  mometasone-formoterol (DULERA) 100-5 MCG/ACT AERO Inhale 2 puffs into the lungs 2 (two) times daily. 08/10/19  Yes Ngetich, Dinah C, NP  spironolactone (ALDACTONE) 25 MG tablet Take 1 tablet (25 mg total) by mouth daily. 08/10/19  Yes Ngetich, Dinah C, NP    Family History Family History  Adopted: Yes  Problem Relation Age of Onset  . Other Son        Growing pains  . Asthma Mother   . Heart Problems Mother   . Migraines Mother   . COPD Mother   . Heart attack Mother   . Diabetes Father   . Peptic Ulcer Father   . Heart Problems Father   . COPD Father   . Heart attack Father     Social History Social History   Tobacco Use  . Smoking status: Former Smoker    Packs/day: 0.25    Years: 17.00    Pack years: 4.25    Types: Cigarettes    Quit date: 03/02/2013    Years since quitting: 6.5  . Smokeless tobacco: Never Used   Substance Use Topics  . Alcohol use: No    Alcohol/week: 0.0 standard drinks  . Drug use: Not Currently    Types: Marijuana    Comment: former - 6 yrs ago     Allergies   Aspirin   Review of Systems Review of Systems  Constitutional: Negative for chills and fever.  HENT: Negative for congestion and rhinorrhea.        Throat swelling  Eyes: Negative for redness and visual disturbance.  Respiratory: Negative for shortness of breath and wheezing.   Cardiovascular: Negative for chest pain and palpitations.  Gastrointestinal: Negative for nausea and vomiting.  Genitourinary: Negative for dysuria and urgency.  Musculoskeletal: Negative for arthralgias and myalgias.  Skin: Negative for pallor and wound.  Neurological: Negative for dizziness and headaches.     Physical Exam Updated Vital Signs BP 126/86 (BP Location: Right Arm)   Pulse 78   Temp 98.8 F (37.1 C) (Oral)   Resp 18   Ht 5\' 9"  (1.753 m)   Wt (!) 140.6 kg   LMP 02/19/2016   SpO2 98%   BMI 45.78 kg/m   Physical Exam Vitals signs and nursing note reviewed.  Constitutional:      General: She is not in acute distress.    Appearance: She is well-developed. She is not diaphoretic.  HENT:     Head: Normocephalic and atraumatic.     Comments: Tolerating secretions without difficulty.  Phonation is normal.  Able to rotate her neck without pain.  Turbinates are normal.  No posterior nasal drip.  No tonsils. Eyes:     Pupils: Pupils are equal, round, and reactive to light.  Neck:     Musculoskeletal: Normal range of motion and neck supple.  Cardiovascular:     Rate and Rhythm: Normal rate and regular rhythm.     Heart sounds: No murmur. No friction rub. No gallop.   Pulmonary:     Effort: Pulmonary effort is normal.     Breath sounds: No wheezing or rales.  Abdominal:     General: There is no distension.     Palpations: Abdomen is soft.     Tenderness: There is no abdominal tenderness.  Musculoskeletal:         General: No tenderness.  Skin:    General: Skin is warm and dry.  Neurological:     Mental Status: She is alert and oriented to person, place, and time.  Psychiatric:        Behavior: Behavior normal.      ED Treatments / Results  Labs (all labs ordered are listed, but only abnormal results are displayed) Labs Reviewed - No data to display  EKG None  Radiology No results found.  Procedures Procedures (including critical care time)  Medications Ordered in ED Medications  dexamethasone (DECADRON) tablet 10 mg (10 mg Oral Given 09/03/19 2147)  alum & mag hydroxide-simeth (MAALOX/MYLANTA) 200-200-20 MG/5ML suspension 30 mL (30 mLs Oral Given 09/03/19 2148)    And  lidocaine (XYLOCAINE) 2 % viscous mouth solution 15 mL (15 mLs Oral Given 09/03/19 2148)     Initial Impression / Assessment and Plan / ED Course  I have reviewed the triage vital signs and the nursing notes.  Pertinent labs & imaging results that were available during my care of the patient were reviewed by me and considered in my medical decision making (see chart for details).        43 yo F with a chief complaints of a feeling that her throat is closing.  She has no other symptoms of allergic reaction.  She is well-appearing and nontoxic.  Has clear lung sounds.  Exam of her posterior pharynx is unremarkable.  Given a GI cocktail and steroids.  Observed in the ED.  The patient was observed in the ED for about 90 minutes.  She had no change in her symptoms.  In fact she had mild improvement with a  GI cocktail.  I discussed with her doing a trial of Zantac or Pepcid and she told me that she actually has a pH probe study this was to be performed on Wednesday.  She did recently come off of her reflux medications.  I feel that worsening reflux is likely the cause of her symptoms.  I will have her call her gastroenterologist or PCP and discuss this with them.  10:37 PM:  I have discussed the diagnosis/risks/treatment  options with the patient and believe the pt to be eligible for discharge home to follow-up with PCP, GI. We also discussed returning to the ED immediately if new or worsening sx occur. We discussed the sx which are most concerning (e.g., sudden worsening trouble breathing, fever, inability to tolerate by mouth) that necessitate immediate return. Medications administered to the patient during their visit and any new prescriptions provided to the patient are listed below.  Medications given during this visit Medications  dexamethasone (DECADRON) tablet 10 mg (10 mg Oral Given 09/03/19 2147)  alum & mag hydroxide-simeth (MAALOX/MYLANTA) 200-200-20 MG/5ML suspension 30 mL (30 mLs Oral Given 09/03/19 2148)    And  lidocaine (XYLOCAINE) 2 % viscous mouth solution 15 mL (15 mLs Oral Given 09/03/19 2148)     The patient appears reasonably screen and/or stabilized for discharge and I doubt any other medical condition or other [facility] requiring further screening, evaluation, or treatment in the ED at this time prior to discharge.    Final Clinical Impressions(s) / ED Diagnoses   Final diagnoses:  Globus sensation    ED Discharge Orders    None       Deno Etienne, DO 09/03/19 2237

## 2019-09-03 NOTE — Discharge Instructions (Signed)
Please call your gastroenterologist or whoever is having you performed the pH probe study and discussed with them your symptoms.  See if they want to change the timing of the study are called off.  Please return to the emergency department for sensation of worsening trouble breathing or inability to swallow.

## 2019-09-03 NOTE — ED Triage Notes (Signed)
Pt reports feeling like throat is swelling that started suddenly. Denies any allergen exposure. Pt states it feels like it is difficult to swallow.

## 2019-09-04 ENCOUNTER — Ambulatory Visit: Payer: PRIVATE HEALTH INSURANCE | Admitting: Orthopaedic Surgery

## 2019-09-05 ENCOUNTER — Encounter (HOSPITAL_COMMUNITY)
Admission: RE | Disposition: A | Discharge status: Home / Self Care | Payer: Self-pay | Source: Home / Self Care | Attending: Gastroenterology

## 2019-09-05 ENCOUNTER — Ambulatory Visit (HOSPITAL_COMMUNITY)
Admission: RE | Admit: 2019-09-05 | Discharge: 2019-09-05 | Disposition: A | Discharge status: Home / Self Care | Payer: PRIVATE HEALTH INSURANCE | Attending: Gastroenterology | Admitting: Gastroenterology

## 2019-09-05 DIAGNOSIS — R1110 Vomiting, unspecified: Secondary | ICD-10-CM | POA: Insufficient documentation

## 2019-09-05 DIAGNOSIS — K219 Gastro-esophageal reflux disease without esophagitis: Secondary | ICD-10-CM | POA: Diagnosis not present

## 2019-09-05 DIAGNOSIS — K449 Diaphragmatic hernia without obstruction or gangrene: Secondary | ICD-10-CM | POA: Diagnosis not present

## 2019-09-05 HISTORY — PX: PH IMPEDANCE STUDY: SHX5565

## 2019-09-05 HISTORY — PX: ESOPHAGEAL MANOMETRY: SHX5429

## 2019-09-05 SURGERY — MANOMETRY, ESOPHAGUS

## 2019-09-05 MED ORDER — LIDOCAINE VISCOUS HCL 2 % MT SOLN
OROMUCOSAL | Status: AC
  Filled 2019-09-05: qty 15

## 2019-09-05 SURGICAL SUPPLY — 2 items
FACESHIELD LNG OPTICON STERILE (SAFETY) IMPLANT
GLOVE BIO SURGEON STRL SZ8 (GLOVE) ×6 IMPLANT

## 2019-09-05 NOTE — Progress Notes (Signed)
Esophageal manometry performed per protocol. Patient tolerated well with no complications.  PH probe placed per protocol placed 34 cm.  Patient tolerated well.  Education given. Patient verbalized understanding.  Patient to return on 09/06/19 at 0955 for probe removal.

## 2019-09-06 ENCOUNTER — Encounter (HOSPITAL_COMMUNITY): Payer: Self-pay | Admitting: Gastroenterology

## 2019-09-10 ENCOUNTER — Other Ambulatory Visit: Payer: Self-pay | Admitting: Family

## 2019-09-10 ENCOUNTER — Encounter: Payer: Self-pay | Admitting: Neurology

## 2019-09-10 ENCOUNTER — Ambulatory Visit (INDEPENDENT_AMBULATORY_CARE_PROVIDER_SITE_OTHER): Payer: PRIVATE HEALTH INSURANCE | Admitting: Neurology

## 2019-09-10 ENCOUNTER — Other Ambulatory Visit: Payer: Self-pay

## 2019-09-10 ENCOUNTER — Emergency Department (HOSPITAL_COMMUNITY)
Admission: EM | Admit: 2019-09-10 | Discharge: 2019-09-11 | Disposition: A | Discharge status: Home / Self Care | Payer: PRIVATE HEALTH INSURANCE | Attending: Emergency Medicine | Admitting: Emergency Medicine

## 2019-09-10 ENCOUNTER — Encounter (HOSPITAL_COMMUNITY): Payer: Self-pay

## 2019-09-10 ENCOUNTER — Emergency Department (HOSPITAL_COMMUNITY): Payer: PRIVATE HEALTH INSURANCE

## 2019-09-10 VITALS — BP 115/76 | HR 69 | Temp 96.4°F | Ht 73.0 in | Wt 299.0 lb

## 2019-09-10 DIAGNOSIS — R569 Unspecified convulsions: Secondary | ICD-10-CM | POA: Diagnosis not present

## 2019-09-10 DIAGNOSIS — N189 Chronic kidney disease, unspecified: Secondary | ICD-10-CM | POA: Insufficient documentation

## 2019-09-10 DIAGNOSIS — I129 Hypertensive chronic kidney disease with stage 1 through stage 4 chronic kidney disease, or unspecified chronic kidney disease: Secondary | ICD-10-CM | POA: Diagnosis not present

## 2019-09-10 DIAGNOSIS — M542 Cervicalgia: Secondary | ICD-10-CM | POA: Diagnosis not present

## 2019-09-10 DIAGNOSIS — G4489 Other headache syndrome: Secondary | ICD-10-CM | POA: Diagnosis not present

## 2019-09-10 DIAGNOSIS — R079 Chest pain, unspecified: Secondary | ICD-10-CM | POA: Diagnosis present

## 2019-09-10 DIAGNOSIS — F1210 Cannabis abuse, uncomplicated: Secondary | ICD-10-CM | POA: Diagnosis not present

## 2019-09-10 DIAGNOSIS — G43019 Migraine without aura, intractable, without status migrainosus: Secondary | ICD-10-CM

## 2019-09-10 DIAGNOSIS — R0989 Other specified symptoms and signs involving the circulatory and respiratory systems: Secondary | ICD-10-CM | POA: Diagnosis not present

## 2019-09-10 DIAGNOSIS — R198 Other specified symptoms and signs involving the digestive system and abdomen: Secondary | ICD-10-CM

## 2019-09-10 DIAGNOSIS — Z87891 Personal history of nicotine dependence: Secondary | ICD-10-CM | POA: Diagnosis not present

## 2019-09-10 DIAGNOSIS — R0602 Shortness of breath: Secondary | ICD-10-CM | POA: Diagnosis not present

## 2019-09-10 DIAGNOSIS — G4730 Sleep apnea, unspecified: Secondary | ICD-10-CM

## 2019-09-10 DIAGNOSIS — E1122 Type 2 diabetes mellitus with diabetic chronic kidney disease: Secondary | ICD-10-CM | POA: Insufficient documentation

## 2019-09-10 DIAGNOSIS — Z79899 Other long term (current) drug therapy: Secondary | ICD-10-CM | POA: Insufficient documentation

## 2019-09-10 MED ORDER — NORTRIPTYLINE HCL 10 MG PO CAPS: ORAL_CAPSULE | ORAL | 3 refills | Status: DC

## 2019-09-10 MED ORDER — LIDOCAINE VISCOUS HCL 2 % MT SOLN
15.0000 mL | Freq: Once | OROMUCOSAL | Status: AC
  Administered 2019-09-10: 15 mL via ORAL
  Filled 2019-09-10: qty 15

## 2019-09-10 MED ORDER — SUMATRIPTAN SUCCINATE 100 MG PO TABS: 100.0000 mg | ORAL_TABLET | Freq: Two times a day (BID) | ORAL | 2 refills | Status: DC | PRN

## 2019-09-10 MED ORDER — ALUM & MAG HYDROXIDE-SIMETH 200-200-20 MG/5ML PO SUSP
30.0000 mL | Freq: Once | ORAL | Status: AC
  Administered 2019-09-10: 30 mL via ORAL
  Filled 2019-09-10: qty 30

## 2019-09-10 MED ORDER — SODIUM CHLORIDE 0.9% FLUSH: 3.0000 mL | Freq: Once | INTRAVENOUS | Status: DC

## 2019-09-10 MED ORDER — FAMOTIDINE IN NACL 20-0.9 MG/50ML-% IV SOLN
20.0000 mg | INTRAVENOUS | Status: AC
  Administered 2019-09-11: 20 mg via INTRAVENOUS
  Filled 2019-09-10: qty 50

## 2019-09-10 MED ORDER — DEXAMETHASONE SODIUM PHOSPHATE 10 MG/ML IJ SOLN
10.0000 mg | Freq: Once | INTRAMUSCULAR | Status: AC
  Administered 2019-09-11: 10 mg via INTRAVENOUS
  Filled 2019-09-10: qty 1

## 2019-09-10 NOTE — ED Triage Notes (Signed)
Patient c/o swelling to the right neck since 09/03/19, SOB that started today, and chest pain that started today. Patient states she has difficulty swallowing, but speaking in complete sentences.  Sats-100 and resp-18.

## 2019-09-10 NOTE — ED Provider Notes (Signed)
[facility] DEPT Provider Note   CSN: [id number] Arrival date & time: 09/10/19  1655     History   Chief Complaint Chief Complaint  Patient presents with  . Chest Pain  . neck swelling  . Shortness of Breath    HPI Victoria Moore is a 43 y.o. female.     The history is provided by the patient and medical records.  Chest Pain Associated symptoms: shortness of breath   Shortness of Breath Associated symptoms: chest pain     43 year old female with history of ADHD, allergies, anxiety, asthma, chronic kidney disease, diabetes, GERD, iron deficiency, sleep apnea, presenting to the ED with globus sensation of the throat.  Reports this is been ongoing since 09/03/2019.  She was evaluated in the ED at that time.  She has since followed up with her GI doctor and had esophageal manometry performed, she has not yet been notified of the results.  States she has actually been having issues for quite some time but seems worse lately.  She reports when she tries to eat and drink she feels like her throat is "swollen" and is having difficulty passing food.  She denies any choking episode but has had some intermittent vomiting.  She is now starting to feel these symptoms in her chest.  She called GI physician tonight and was told to come to the ED for evaluation.  She has been tried on multiple PPIs and states they work for a few days to weeks but then she is right back to where she started.  She had prior endoscopy in October 2019 which revealed a non-obstructive Schatzki ring which was dilated, remainder of exam was normal.  States she remembers her mother having similar symptoms as well.  Past Medical History:  Diagnosis Date  . ADHD   . Allergy   . Anemia   . Anxiety   . Arthritis   . Asthma   . Blood transfusion without reported diagnosis   . Chronic kidney disease   . Common migraine with intractable migraine 10/07/2016  . Depression   . Diabetes ([place])   .  Edema   . GERD (gastroesophageal reflux disease)   . Herniated lumbar intervertebral disc   . Hilar adenopathy   . Hypertension   . Iron deficiency   . Migraine   . Prolonged capillary refill time   . Sarcoidosis    personal history of  . Seizures ([place])    history of   . Sleep apnea   . Spondylosis   . Vitamin D deficiency     Patient Active Problem List   Diagnosis Date Noted  . Leg pain 12/07/2018  . Low libido 11/27/2018  . Prolonged capillary refill time 10/04/2018  . Decreased pedal pulses 10/04/2018  . Spondylosis 09/15/2018  . Chronic low back pain with sciatica 09/15/2018  . Impaired fasting blood sugar 09/11/2018  . Weight gain 09/11/2018  . Edema 09/11/2018  . Frequency of urination 09/11/2018  . Right leg swelling 08/18/2018  . Mild sleep apnea 08/03/2018  . Hypokalemia 06/28/2018  . Medication side effect 06/22/2018  . Acute low back pain without sciatica 06/22/2018  . Personal history of sarcoidosis 05/26/2018  . Vitamin D deficiency 05/26/2018  . Encounter for health maintenance examination with abnormal findings 05/26/2018  . Depression, major, single episode, moderate ([place]) 05/26/2018  . Urinary tract infection without hematuria 05/26/2018  . Daytime sleepiness 05/26/2018  . Chest wall pain 05/26/2018  . GERD (gastroesophageal reflux  disease) 05/26/2018  . Essential hypertension, benign 11/15/2016  . 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 Morbid (severe) obesity due to excess calories ([place]) 10/23/2016  . Cough variant asthma vs UACS  10/23/2016  . Common migraine with intractable migraine 10/07/2016  . Dyspnea 04/15/2016  . Hilar adenopathy 04/15/2016  . Seizures ([place]) 03/11/2016  . Fibroids 03/11/2016  . Anemia 11/12/2014  . Iron deficiency 11/12/2014    Past Surgical History:  Procedure Laterality Date  . ABDOMINAL HYSTERECTOMY    . ESOPHAGEAL MANOMETRY N/A 09/05/2019   Procedure: ESOPHAGEAL MANOMETRY (EM);  Surgeon: Lavena Bullion, DO;   Location: [facility];  Service: Gastroenterology;  Laterality: N/A;  . LAPAROSCOPIC OVARIAN CYSTECTOMY Left 03/11/2016   Procedure: LAPAROSCOPIC OVARIAN CYSTECTOMY;  Surgeon: Eldred Manges, MD;  Location: [facility];  Service: Gynecology;  Laterality: Left;  . LAPAROSCOPIC VAGINAL HYSTERECTOMY WITH SALPINGECTOMY Bilateral 03/11/2016   Procedure: LAPAROSCOPIC ASSISTED VAGINAL HYSTERECTOMY WITH SALPINGECTOMY;  Surgeon: Eldred Manges, MD;  Location: [facility];  Service: Gynecology;  Laterality: Bilateral;  . [place] IMPEDANCE STUDY  09/05/2019   Procedure: PH IMPEDANCE STUDY;  Surgeon: Lavena Bullion, DO;  Location: [facility];  Service: Gastroenterology;;  . TUBAL LIGATION       OB History    Gravida  1   Para      Term      Preterm      AB      Living        SAB      TAB      Ectopic      Multiple      Live Births               Home Medications    Prior to Admission medications   Medication Sig Start Date End Date Taking? Authorizing Provider  albuterol (PROVENTIL) (2.5 MG/3ML) 0.083% nebulizer solution Take 2.5 mg by nebulization every 6 (six) hours as needed for wheezing or shortness of breath.    [provider]  albuterol (VENTOLIN HFA) 108 (90 Base) MCG/ACT inhaler Inhale 1-2 puffs into the lungs every 4 (four) hours as needed for wheezing or shortness of breath. 08/10/19   Ngetich, Dinah C, NP  ALPRAZolam (XANAX) 1 MG tablet Take 0.5-1 tablets (0.5-1 mg total) by mouth at bedtime as needed for anxiety. 08/10/19   Ngetich, Dinah C, NP  amphetamine-dextroamphetamine (ADDERALL XR) 30 MG 24 hr capsule Take 1 capsule (30 mg total) by mouth daily as needed (ADHD). 08/23/19   Ngetich, Dinah C, NP  Baclofen 5 MG TABS Take by mouth. 1 bid PRN    [provider]  chlorthalidone (HYGROTON) 25 MG tablet Take 1 tablet (25 mg total) by mouth daily. 08/10/19   Ngetich, Dinah C, NP  ferrous sulfate 325 (65 FE) MG tablet Take 325 mg by mouth daily with breakfast.     [provider]  metFORMIN (GLUCOPHAGE) 500 MG tablet Take 1 tablet by mouth daily.     [provider]  mometasone-formoterol (DULERA) 100-5 MCG/ACT AERO Inhale 2 puffs into the lungs 2 (two) times daily. 08/10/19   Ngetich, Dinah C, NP  nortriptyline (PAMELOR) 10 MG capsule Take one capsule at night for one week, then take 2 capsules at night for one week, then take 3 capsules at night 09/10/19   Kathrynn Ducking, MD  spironolactone (ALDACTONE) 25 MG tablet Take 1 tablet (25 mg total) by mouth daily. 08/10/19   Ngetich, Nelda Bucks, NP  SUMAtriptan (  IMITREX) 100 MG tablet Take 1 tablet (100 mg total) by mouth 2 (two) times daily as needed for migraine. May repeat in 2 hours if headache persists or recurs. 09/10/19   Kathrynn Ducking, MD    Family History Family History  Adopted: Yes  Problem Relation Age of Onset  . Other Son        Growing pains  . Asthma Mother   . Heart Problems Mother   . Migraines Mother   . COPD Mother   . Heart attack Mother   . Diabetes Father   . Peptic Ulcer Father   . Heart Problems Father   . COPD Father   . Heart attack Father     Social History Social History   Tobacco Use  . Smoking status: Former Smoker    Packs/day: 0.25    Years: 17.00    Pack years: 4.25    Types: Cigarettes    Quit date: 03/02/2013    Years since quitting: 6.5  . Smokeless tobacco: Never Used  Substance Use Topics  . Alcohol use: No    Alcohol/week: 0.0 standard drinks  . Drug use: Not Currently    Types: Marijuana    Comment: former - 6 yrs ago     Allergies   Aspirin   Review of Systems Review of Systems  Respiratory: Positive for shortness of breath.   Cardiovascular: Positive for chest pain.  All other systems reviewed and are negative.    Physical Exam Updated Vital Signs BP 133/83 (BP Location: Left Arm)   Pulse 64   Temp 98.4 F (36.9 C) (Oral)   Resp 18   Ht 6\' 1"  (1.854 m)   Wt 135.6 kg   LMP 02/19/2016   SpO2 100%   BMI  39.45 kg/m   Physical Exam Vitals signs and nursing note reviewed.  Constitutional:      Appearance: She is well-developed.  HENT:     Head: Normocephalic and atraumatic.     Mouth/Throat:     Comments: Oropharynx is clear, no tonsillar edema or exudates, handling secretions well, normal phonation without stridor Eyes:     Conjunctiva/sclera: Conjunctivae normal.     Pupils: Pupils are equal, round, and reactive to light.  Neck:     Musculoskeletal: Normal range of motion.     Comments: No visible swelling of the neck, no tracheal deviation, full range of motion maintained Cardiovascular:     Rate and Rhythm: Normal rate and regular rhythm.     Heart sounds: Normal heart sounds.  Pulmonary:     Effort: Pulmonary effort is normal.     Breath sounds: Normal breath sounds. No wheezing.     Comments: Lungs clear, no distress Abdominal:     General: Bowel sounds are normal.     Palpations: Abdomen is soft.  Musculoskeletal: Normal range of motion.  Skin:    General: Skin is warm and dry.  Neurological:     Mental Status: She is alert and oriented to person, place, and time.      ED Treatments / Results  Labs (all labs ordered are listed, but only abnormal results are displayed) Labs Reviewed  BASIC METABOLIC PANEL - Abnormal; Notable for the following components:      Result Value   Glucose, Bld 113 (*)    Creatinine, Ser 1.01 (*)    All other components within normal limits  CBC  HCG, QUANTITATIVE, PREGNANCY  TROPONIN I (HIGH SENSITIVITY)    EKG  EKG Interpretation  Date/Time:  Monday September 10 2019 23:48:34 EDT Ventricular Rate:  56 PR Interval:    QRS Duration: 109 QT Interval:  446 QTC Calculation: 431 R Axis:   65 Text Interpretation:  Sinus rhythm Low voltage, precordial leads Borderline T abnormalities, diffuse leads No significant change since last tracing Confirmed by Dorie Rank [phone]) on 09/11/2019 12:04:32 AM   Radiology Dg Chest 2 View   Result Date: 09/10/2019 CLINICAL DATA:  Swelling in the right neck. Shortness of breath started today. EXAM: CHEST - 2 VIEW COMPARISON:  07/03/2019 FINDINGS: The heart size and mediastinal contours are within normal limits. Both lungs are clear. The visualized skeletal structures are unremarkable. IMPRESSION: No active cardiopulmonary disease. Electronically Signed   By: Van Clines M.D.   On: 09/10/2019 18:40    Procedures Procedures (including critical care time)  Medications Ordered in ED Medications  sodium chloride flush (NS) 0.9 % injection 3 mL (has no administration in time range)  acetaminophen (TYLENOL) tablet 1,000 mg (has no administration in time range)  dexamethasone (DECADRON) injection 10 mg (10 mg Intravenous Given 09/11/19 0036)  famotidine (PEPCID) IVPB 20 mg premix (0 mg Intravenous Stopped 09/11/19 0113)  alum & mag hydroxide-simeth (MAALOX/MYLANTA) 200-200-20 MG/5ML suspension 30 mL (30 mLs Oral Given 09/10/19 2351)    And  lidocaine (XYLOCAINE) 2 % viscous mouth solution 15 mL (15 mLs Oral Given 09/10/19 2351)     Initial Impression / Assessment and Plan / ED Course  I have reviewed the triage vital signs and the nursing notes.  Pertinent labs & imaging results that were available during my care of the patient were reviewed by me and considered in my medical decision making (see chart for details).  43 y.o. F here with globus sensation in her throat since 09/03/19.  Seen here for same.  She had recent follow-up with GI and underwent esophageal manometry but has not yet gotten the results.  After talking with her, it sounds like this has been ongoing for several weeks to months.  Reports sensation is always there but worse when trying to eat or drink.  She has not had any choking episodes but does report some intermittent vomiting.  Now feels like sensation is in her chest.  EKG is reassuring.  Labs are pending.  When seen for this previously she was treated with  Decadron, GI cocktail and seemed to improve so we will repeat that, and add Pepcid.  2:22 AM Labs reassuring. Globus sensation has improved, tolerating some fluids here.  Airway remains widely patent. Now she feels like she is getting a migraine since she has been waiting in the ED all day, given tylenol.  She will need close follow-up with her GI physician, continue PPI.  Also recommended to follow-up with her PCP.  Can take Maalox PRN as this helped her today.  Return here for any new/acute changes.  Final Clinical Impressions(s) / ED Diagnoses   Final diagnoses:  Globus sensation    ED Discharge Orders    None       Larene Pickett, PA-C 09/11/19 0250    Mesner, Corene Cornea, MD 09/11/19 [phone]

## 2019-09-10 NOTE — Progress Notes (Signed)
Reason for visit: Headaches, seizures  Referring physician: Dr. Clent Ridges is a 43 y.o. female  History of present illness:  Ms. Gotch is a 43 year old right-handed black female who was last seen through this office in February 2018.  The patient has a history of seizures, she had a recurring seizure on 11 February 2017, she was placed on Topamax at that time working up to 50 mg twice daily dose to treat the seizures as well as migraine headaches.  The patient does have a longstanding history of migraine, but over the last year the headaches have converted from being intermittent in nature to daily in nature and now the headache is constant from sun up to sundown over the last month.  The patient reports that the headaches are throughout the head, within the last day she has had episodes of darkness or loss of vision that will come and go for 6 to 7 seconds and then return.  She has neck stiffness and some discomfort down the spine, she also reports some muffled hearing but no pulsatile tinnitus.  The patient reports some occasional tingling in the hands but otherwise no numbness, she feels "weak all over.  She is working, she operates a Teacher, music, she has been off of any anticonvulsant medication for over a year and a half, she has not had any recurrent seizures.  The patient is obese but she claims that she has recently lost some weight.  The patient denies any significant balance issues or difficulty controlling the bowels or the bladder.  She has a history of sleep apnea, she has not yet gotten her CPAP machine.  Past Medical History:  Diagnosis Date  . ADHD   . Allergy   . Anemia   . Anxiety   . Arthritis   . Asthma   . Blood transfusion without reported diagnosis   . Chronic kidney disease   . Common migraine with intractable migraine 10/07/2016  . Depression   . Diabetes ([place])   . Edema   . GERD (gastroesophageal reflux disease)   . Herniated lumbar  intervertebral disc   . Hilar adenopathy   . Hypertension   . Iron deficiency   . Migraine   . Prolonged capillary refill time   . Sarcoidosis    personal history of  . Seizures ([place])    history of   . Sleep apnea   . Spondylosis   . Vitamin D deficiency     Past Surgical History:  Procedure Laterality Date  . ABDOMINAL HYSTERECTOMY    . ESOPHAGEAL MANOMETRY N/A 09/05/2019   Procedure: ESOPHAGEAL MANOMETRY (EM);  Surgeon: Lavena Bullion, DO;  Location: [facility];  Service: Gastroenterology;  Laterality: N/A;  . LAPAROSCOPIC OVARIAN CYSTECTOMY Left 03/11/2016   Procedure: LAPAROSCOPIC OVARIAN CYSTECTOMY;  Surgeon: Eldred Manges, MD;  Location: [facility];  Service: Gynecology;  Laterality: Left;  . LAPAROSCOPIC VAGINAL HYSTERECTOMY WITH SALPINGECTOMY Bilateral 03/11/2016   Procedure: LAPAROSCOPIC ASSISTED VAGINAL HYSTERECTOMY WITH SALPINGECTOMY;  Surgeon: Eldred Manges, MD;  Location: [facility];  Service: Gynecology;  Laterality: Bilateral;  . [place] IMPEDANCE STUDY  09/05/2019   Procedure: [place] IMPEDANCE STUDY;  Surgeon: Lavena Bullion, DO;  Location: [facility];  Service: Gastroenterology;;  . TUBAL LIGATION      Family History  Adopted: Yes  Problem Relation Age of Onset  . Other Son        Growing pains  . Asthma Mother   . Heart Problems  Mother   . Migraines Mother   . COPD Mother   . Heart attack Mother   . Diabetes Father   . Peptic Ulcer Father   . Heart Problems Father   . COPD Father   . Heart attack Father     Social history:  reports that she quit smoking about 6 years ago. Her smoking use included cigarettes. She has a 4.25 pack-year smoking history. She has never used smokeless tobacco. She reports previous drug use. Drug: Marijuana. She reports that she does not drink alcohol.  Medications:  Prior to Admission medications   Medication Sig Start Date End Date Taking? Authorizing Provider  albuterol (PROVENTIL) (2.5 MG/3ML) 0.083% nebulizer solution  Take 2.5 mg by nebulization every 6 (six) hours as needed for wheezing or shortness of breath.   Yes [provider]  ALPRAZolam Duanne Moron) 1 MG tablet Take 0.5-1 tablets (0.5-1 mg total) by mouth at bedtime as needed for anxiety. 08/10/19  Yes Ngetich, Dinah C, NP  amphetamine-dextroamphetamine (ADDERALL XR) 30 MG 24 hr capsule Take 1 capsule (30 mg total) by mouth daily as needed (ADHD). 08/23/19  Yes Ngetich, Dinah C, NP  Baclofen 5 MG TABS Take by mouth. 1 bid PRN   Yes [provider]  chlorthalidone (HYGROTON) 25 MG tablet Take 1 tablet (25 mg total) by mouth daily. 08/10/19  Yes Ngetich, Dinah C, NP  ferrous sulfate 325 (65 FE) MG tablet Take 325 mg by mouth daily with breakfast.   Yes [provider]  metFORMIN (GLUCOPHAGE) 500 MG tablet Take 1 tablet by mouth daily.    Yes [provider]  mometasone-formoterol (DULERA) 100-5 MCG/ACT AERO Inhale 2 puffs into the lungs 2 (two) times daily. 08/10/19  Yes Ngetich, Dinah C, NP  spironolactone (ALDACTONE) 25 MG tablet Take 1 tablet (25 mg total) by mouth daily. 08/10/19  Yes Ngetich, Dinah C, NP  albuterol (VENTOLIN HFA) 108 (90 Base) MCG/ACT inhaler Inhale 1-2 puffs into the lungs every 4 (four) hours as needed for wheezing or shortness of breath. 08/10/19   Ngetich, Nelda Bucks, NP      Allergies  Allergen Reactions  . Aspirin Anaphylaxis and Hives         ROS:  Out of a complete 14 system review of symptoms, the patient complains only of the following symptoms, and all other reviewed systems are negative.  Headaches Visual loss Neck pain and stiffness  Blood pressure 115/76, pulse 69, temperature (!) 96.4 F (35.8 C), temperature source Temporal, height 6\' 1"  (1.854 m), weight 299 lb (135.6 kg), last menstrual period 02/19/2016.  Physical Exam  General: The patient is alert and cooperative at the time of the examination.  The patient is markedly obese.  Eyes: Pupils are equal, round, and reactive to  light. Discs are flat bilaterally.  Neck: The neck is supple, no carotid bruits are noted.  Respiratory: The respiratory examination is clear.  Cardiovascular: The cardiovascular examination reveals a regular rate and rhythm, no obvious murmurs or rubs are noted.  Skin: Extremities are without significant edema.  Neurologic Exam  Mental status: The patient is alert and oriented x 3 at the time of the examination. The patient has apparent normal recent and remote memory, with an apparently normal attention span and concentration ability.  Cranial nerves: Facial symmetry is present. There is good sensation of the face to pinprick and soft touch bilaterally. The strength of the facial muscles and the muscles to head turning and shoulder shrug are normal  bilaterally. Speech is well enunciated, no aphasia or dysarthria is noted. Extraocular movements are full. Visual fields are full. The tongue is midline, and the patient has symmetric elevation of the soft palate. No obvious hearing deficits are noted.  Motor: The motor testing reveals 5 over 5 strength of all 4 extremities. Good symmetric motor tone is noted throughout.  Sensory: Sensory testing is intact to pinprick, soft touch, vibration sensation, and position sense on all 4 extremities. No evidence of extinction is noted.  Coordination: Cerebellar testing reveals good finger-nose-finger and heel-to-shin bilaterally.  Gait and station: Gait is normal. Tandem gait is normal. Romberg is negative. No drift is seen.  Reflexes: Deep tendon reflexes are symmetric and normal bilaterally. Toes are downgoing bilaterally.   Assessment/Plan:  1.  Chronic daily headache  2.  History of seizures, not currently on medication  3.  Obesity  4.  Episodic visual loss  5.  Sleep apnea  The patient has had worsening of her headaches over time, they are now daily from sent up to sundown and recently associated with episodic visual loss.  For this  reason, MRI of the brain will be done with and without gadolinium enhancement, if this is normal, we will send the patient for lumbar puncture to rule out pseudotumor cerebri.  The patient in the meantime will be placed on nortriptyline working up on the dose and she will be given Imitrex to take if needed, Maxalt did not offer benefit.  The patient cannot take aspirin like medications secondary to allergy.  She will follow-up here in 3 months.  She will call for any dose adjustments of her medication.  At some point, she will need to go on a medication for seizures.  Jill Alexanders MD 09/10/2019 10:19 AM  [facility] [address] [place], [place] [address]  Phone [phone] Fax [phone]

## 2019-09-10 NOTE — Patient Instructions (Signed)
We will go on nortriptyline for the headache, call for any dose adjustments.  Pamelor (nortriptyline) is an antidepressant medication that has many uses that may include headache, whiplash injuries, or for peripheral neuropathy pain. Side effects may include drowsiness, dry mouth, blurred vision, or constipation. As with any antidepressant medication, worsening depression may occur. If you had any significant side effects, please call our office. The full effects of this medication may take 7-10 days after starting the drug, or going up on the dose.   Use Imitrex 100 mg tablets if needed.

## 2019-09-11 ENCOUNTER — Telehealth: Payer: Self-pay | Admitting: Neurology

## 2019-09-11 LAB — BASIC METABOLIC PANEL
Anion gap: 11 (ref 5–15)
BUN: 10 mg/dL (ref 6–20)
CO2: 26 mmol/L (ref 22–32)
Calcium: 9.2 mg/dL (ref 8.9–10.3)
Chloride: 101 mmol/L (ref 98–111)
Creatinine, Ser: 1.01 mg/dL — ABNORMAL HIGH (ref 0.44–1.00)
GFR calc Af Amer: >60 mL/min (ref >60)
GFR calc non Af Amer: >60 mL/min (ref >60)
Glucose, Bld: 113 mg/dL — ABNORMAL HIGH (ref 70–99)
Potassium: 3.5 mmol/L (ref 3.5–5.1)
Sodium: 138 mmol/L (ref 135–145)

## 2019-09-11 LAB — CBC
HCT: 42.1 % (ref 36.0–46.0)
Hemoglobin: 13.3 g/dL (ref 12.0–15.0)
MCH: 28.1 pg (ref 26.0–34.0)
MCHC: 31.6 g/dL (ref 30.0–36.0)
MCV: 89 fL (ref 80.0–100.0)
Platelets: 199 10*3/uL (ref 150–400)
RBC: 4.73 MIL/uL (ref 3.87–5.11)
RDW: 13.2 % (ref 11.5–15.5)
WBC: 5.6 10*3/uL (ref 4.0–10.5)
nRBC: 0 % (ref 0.0–0.2)

## 2019-09-11 LAB — TROPONIN I (HIGH SENSITIVITY): Troponin I (High Sensitivity): <2 ng/L (ref <18)

## 2019-09-11 LAB — HCG, QUANTITATIVE, PREGNANCY: hCG, Beta Chain, Quant, S: <1 m[IU]/mL (ref <5)

## 2019-09-11 MED ORDER — ACETAMINOPHEN 500 MG PO TABS
1000.0000 mg | ORAL_TABLET | Freq: Once | ORAL | Status: AC
  Administered 2019-09-11: 1000 mg via ORAL
  Filled 2019-09-11: qty 2

## 2019-09-11 NOTE — Telephone Encounter (Signed)
ambetter of [place] Auth: [id number] (exp. 09/11/19 to 01/09/20) patient is scheduled at [facility] for 09/16/19 arrival time is 12:30 pm. Patient is aware of time and day. She is also aware she has to go through the ER to check in.

## 2019-09-11 NOTE — ED Notes (Signed)
IV team unable to obtain blood.

## 2019-09-11 NOTE — Telephone Encounter (Signed)
ambetter of [place] pending sight location for [facility]

## 2019-09-11 NOTE — Discharge Instructions (Addendum)
Continue your PPI.  Can use over the counter Maalox when needed since that seemed to help you here. Follow-up with your GI doctor for ongoing management. Return to the ED for new or worsening symptoms.

## 2019-09-11 NOTE — ED Notes (Signed)
Pt verbalized discharge instructions and follow up care. Alert and ambulatory. No iv. Has ride home.

## 2019-09-11 NOTE — ED Notes (Signed)
Phlebotomy at bedside.

## 2019-09-13 ENCOUNTER — Encounter: Payer: Self-pay | Admitting: Internal Medicine

## 2019-09-13 ENCOUNTER — Ambulatory Visit: Payer: PRIVATE HEALTH INSURANCE | Admitting: Gastroenterology

## 2019-09-13 ENCOUNTER — Ambulatory Visit: Payer: PRIVATE HEALTH INSURANCE | Admitting: Internal Medicine

## 2019-09-14 ENCOUNTER — Emergency Department (HOSPITAL_COMMUNITY)
Admission: EM | Admit: 2019-09-14 | Discharge: 2019-09-15 | Disposition: A | Discharge status: Home / Self Care | Payer: PRIVATE HEALTH INSURANCE | Attending: Emergency Medicine | Admitting: Emergency Medicine

## 2019-09-14 ENCOUNTER — Encounter: Payer: Self-pay | Admitting: Gastroenterology

## 2019-09-14 ENCOUNTER — Ambulatory Visit (INDEPENDENT_AMBULATORY_CARE_PROVIDER_SITE_OTHER): Payer: PRIVATE HEALTH INSURANCE | Admitting: Family

## 2019-09-14 ENCOUNTER — Other Ambulatory Visit: Payer: Self-pay

## 2019-09-14 ENCOUNTER — Encounter: Payer: Self-pay | Admitting: Family

## 2019-09-14 ENCOUNTER — Emergency Department (HOSPITAL_COMMUNITY): Payer: PRIVATE HEALTH INSURANCE

## 2019-09-14 ENCOUNTER — Encounter (HOSPITAL_COMMUNITY): Payer: Self-pay | Admitting: Emergency Medicine

## 2019-09-14 VITALS — BP 128/86 | HR 99 | Temp 97.8°F | Resp 20 | Ht 69.0 in | Wt 302.8 lb

## 2019-09-14 DIAGNOSIS — G43809 Other migraine, not intractable, without status migrainosus: Secondary | ICD-10-CM | POA: Insufficient documentation

## 2019-09-14 DIAGNOSIS — Z7984 Long term (current) use of oral hypoglycemic drugs: Secondary | ICD-10-CM | POA: Insufficient documentation

## 2019-09-14 DIAGNOSIS — E1122 Type 2 diabetes mellitus with diabetic chronic kidney disease: Secondary | ICD-10-CM | POA: Diagnosis not present

## 2019-09-14 DIAGNOSIS — I129 Hypertensive chronic kidney disease with stage 1 through stage 4 chronic kidney disease, or unspecified chronic kidney disease: Secondary | ICD-10-CM | POA: Insufficient documentation

## 2019-09-14 DIAGNOSIS — Z87891 Personal history of nicotine dependence: Secondary | ICD-10-CM | POA: Insufficient documentation

## 2019-09-14 DIAGNOSIS — R079 Chest pain, unspecified: Secondary | ICD-10-CM | POA: Diagnosis not present

## 2019-09-14 DIAGNOSIS — I10 Essential (primary) hypertension: Secondary | ICD-10-CM | POA: Diagnosis not present

## 2019-09-14 DIAGNOSIS — J45909 Unspecified asthma, uncomplicated: Secondary | ICD-10-CM | POA: Diagnosis not present

## 2019-09-14 DIAGNOSIS — N189 Chronic kidney disease, unspecified: Secondary | ICD-10-CM | POA: Insufficient documentation

## 2019-09-14 DIAGNOSIS — R5383 Other fatigue: Secondary | ICD-10-CM

## 2019-09-14 DIAGNOSIS — F411 Generalized anxiety disorder: Secondary | ICD-10-CM

## 2019-09-14 DIAGNOSIS — Z79899 Other long term (current) drug therapy: Secondary | ICD-10-CM | POA: Diagnosis not present

## 2019-09-14 DIAGNOSIS — R519 Headache, unspecified: Secondary | ICD-10-CM

## 2019-09-14 LAB — CBC
HCT: 42.5 % (ref 36.0–46.0)
Hemoglobin: 13.7 g/dL (ref 12.0–15.0)
MCH: 28.1 pg (ref 26.0–34.0)
MCHC: 32.2 g/dL (ref 30.0–36.0)
MCV: 87.3 fL (ref 80.0–100.0)
Platelets: 252 10*3/uL (ref 150–400)
RBC: 4.87 MIL/uL (ref 3.87–5.11)
RDW: 13 % (ref 11.5–15.5)
WBC: 6.5 10*3/uL (ref 4.0–10.5)
nRBC: 0 % (ref 0.0–0.2)

## 2019-09-14 LAB — I-STAT BETA HCG BLOOD, ED (MC, WL, AP ONLY): I-stat hCG, quantitative: <5 m[IU]/mL (ref <5)

## 2019-09-14 LAB — BASIC METABOLIC PANEL
Anion gap: 11 (ref 5–15)
BUN: 13 mg/dL (ref 6–20)
CO2: 27 mmol/L (ref 22–32)
Calcium: 9.3 mg/dL (ref 8.9–10.3)
Chloride: 99 mmol/L (ref 98–111)
Creatinine, Ser: 1.17 mg/dL — ABNORMAL HIGH (ref 0.44–1.00)
GFR calc Af Amer: >60 mL/min (ref >60)
GFR calc non Af Amer: 56 mL/min — ABNORMAL LOW (ref >60)
Glucose, Bld: 134 mg/dL — ABNORMAL HIGH (ref 70–99)
Potassium: 3.3 mmol/L — ABNORMAL LOW (ref 3.5–5.1)
Sodium: 137 mmol/L (ref 135–145)

## 2019-09-14 LAB — TROPONIN I (HIGH SENSITIVITY): Troponin I (High Sensitivity): 3 ng/L (ref <18)

## 2019-09-14 MED ORDER — SODIUM CHLORIDE 0.9% FLUSH: 3.0000 mL | Freq: Once | INTRAVENOUS | Status: DC

## 2019-09-14 MED ORDER — NITROGLYCERIN 0.4 MG SL SUBL
0.4000 mg | SUBLINGUAL_TABLET | SUBLINGUAL | Status: AC | PRN
  Administered 2019-09-14: 0.4 mg via SUBLINGUAL

## 2019-09-14 NOTE — ED Notes (Signed)
Updated on wait for treatment room. 

## 2019-09-14 NOTE — ED Triage Notes (Addendum)
Arrived via EMS. Onset one month ago seen at the hospital and primary doctors office. Sent to the ED for evaluation today chest pain pressure and headache throbbing 10/10. Alert answering and following commands appropriate. Doctor's office administered nitro 1 tab  and EMS 1 tab. Patient allergic to aspirin.

## 2019-09-14 NOTE — Progress Notes (Signed)
Provider: Marlowe Sax FNP-C  Ngetich, Nelda Bucks, NP  Patient Care Team: Ngetich, Nelda Bucks, NP as PCP - General (Family Medicine)  Extended Emergency Contact Information Primary Emergency Contact: [facility] Address: [address]          [place], [place] [address] Johnnette Litter of [place] Phone: [phone] Mobile Phone: [phone] Relation: Significant other Secondary Emergency Contact: Denzil Magnuson Home Phone: [phone] Mobile Phone: [phone] Relation: Mother  Code Status:  Full Code  Goals of care: Advanced Directive information Advanced Directives 09/10/2019  Does Patient Have a Medical Advance Directive? No  Would patient like information on creating a medical advance directive? No - Patient declined     Chief Complaint  Patient presents with  . Acute Visit    c/o Chest Pain    HPI:  Pt is a 43 y.o. female seen today for an acute visit for follow up ED visit for chest pain.she complains of mid chest pain.she describes pain as pressure on the chest non-radiating.she rates chest pain 10/10 on scale.States feeling tired all the time.Her vital signs are stable.Nitrogylcerine given. She is status post ED visit 09/10/2019 for chest pain,neck swelling,shortness of breath and globus sensation of the throat.Her lab work and EKG was unremarkable.she was treated with decadron,GI cocktail and Pepcid with improvement. She denies any acid reflux states has not eaten anything.Also denies any fever,chills,cough or shortness of breath.    Past Medical History:  Diagnosis Date  . ADHD   . Allergy   . Anemia   . Anxiety   . Arthritis   . Asthma   . Blood transfusion without reported diagnosis   . Chronic kidney disease   . Common migraine with intractable migraine 10/07/2016  . Depression   . Diabetes ([place])   . Edema   . GERD (gastroesophageal reflux disease)   . Herniated lumbar intervertebral disc   . Hilar adenopathy   . Hypertension   . Iron deficiency   .  Migraine   . Prolonged capillary refill time   . Sarcoidosis    personal history of  . Seizures ([place])    history of   . Sleep apnea   . Spondylosis   . Vitamin D deficiency    Past Surgical History:  Procedure Laterality Date  . ABDOMINAL HYSTERECTOMY    . ESOPHAGEAL MANOMETRY N/A 09/05/2019   Procedure: ESOPHAGEAL MANOMETRY (EM);  Surgeon: Lavena Bullion, DO;  [facility];  Service: Gastroenterology;  Laterality: N/A;  . LAPAROSCOPIC OVARIAN CYSTECTOMY Left 03/11/2016   Procedure: LAPAROSCOPIC OVARIAN CYSTECTOMY;  Surgeon: Eldred Manges, MD;  Location: [facility];  Service: Gynecology;  Laterality: Left;  . LAPAROSCOPIC VAGINAL HYSTERECTOMY WITH SALPINGECTOMY Bilateral 03/11/2016   Procedure: LAPAROSCOPIC ASSISTED VAGINAL HYSTERECTOMY WITH SALPINGECTOMY;  Surgeon: Eldred Manges, MD;  Location: [facility];  Service: Gynecology;  Laterality: Bilateral;  . [place] IMPEDANCE STUDY  09/05/2019   Procedure: [place] IMPEDANCE STUDY;  Surgeon: Lavena Bullion, DO;  [facility];  Service: Gastroenterology;;  . TUBAL LIGATION      Allergies  Allergen Reactions  . Aspirin Anaphylaxis and Hives         Outpatient Encounter Medications as of 09/14/2019  Medication Sig  . albuterol (PROVENTIL) (2.5 MG/3ML) 0.083% nebulizer solution Take 2.5 mg by nebulization every 6 (six) hours as needed for wheezing or shortness of breath.  Marland Kitchen albuterol (VENTOLIN HFA) 108 (90 Base) MCG/ACT inhaler Inhale 1-2 puffs into the lungs every 4 (four) hours as needed for wheezing or  shortness of breath.  . ALPRAZolam (XANAX) 1 MG tablet Take 0.5-1 tablets (0.5-1 mg total) by mouth at bedtime as needed for anxiety.  Marland Kitchen amphetamine-dextroamphetamine (ADDERALL XR) 30 MG 24 hr capsule Take 1 capsule (30 mg total) by mouth daily as needed (ADHD).  . Baclofen 5 MG TABS Take 5 mg by mouth 2 (two) times daily as needed (muscle spasm).   . chlorthalidone (HYGROTON) 25 MG tablet Take 1 tablet (25 mg total) by mouth  daily.  . ferrous sulfate 325 (65 FE) MG tablet Take 325 mg by mouth daily with breakfast.  . metFORMIN (GLUCOPHAGE) 500 MG tablet Take 1 tablet by mouth daily.   . mometasone-formoterol (DULERA) 100-5 MCG/ACT AERO Inhale 2 puffs into the lungs 2 (two) times daily.  . nortriptyline (PAMELOR) 10 MG capsule Take one capsule at night for one week, then take 2 capsules at night for one week, then take 3 capsules at night  . spironolactone (ALDACTONE) 25 MG tablet Take 1 tablet (25 mg total) by mouth daily.  . SUMAtriptan (IMITREX) 100 MG tablet Take 1 tablet (100 mg total) by mouth 2 (two) times daily as needed for migraine. May repeat in 2 hours if headache persists or recurs.   No facility-administered encounter medications on file as of 09/14/2019.     Review of Systems  Constitutional: Positive for fatigue. Negative for chills and fever.       Unable to tolerate oral intake   HENT: Negative for congestion, rhinorrhea, sinus pressure, sinus pain and sneezing.        Feels like something is in the Throat,neck swelling.  Respiratory: Negative for chest tightness, shortness of breath and wheezing.   Cardiovascular: Positive for chest pain. Negative for palpitations and leg swelling.  Gastrointestinal: Negative for abdominal distention, abdominal pain, constipation, diarrhea and vomiting.  Musculoskeletal:       Reports loss of balance   Skin: Negative for color change, pallor and rash.  Neurological: Negative for dizziness and weakness.       Intermittent headaches/migraines  Psychiatric/Behavioral: Negative for agitation, confusion and sleep disturbance. The patient is not nervous/anxious.     Immunization History  Administered Date(s) Administered  . Influenza-Unspecified 08/03/2019  . PPD Test 04/20/2017  . Pneumococcal Polysaccharide-23 11/13/2014  . Td 10/18/2015  . Tdap 05/08/2014, 01/15/2016   Pertinent  Health Maintenance Due  Topic Date Due  . PAP SMEAR-Modifier  05/26/2020   . INFLUENZA VACCINE  Completed   Fall Risk  12/17/2016  Falls in the past year? No     Vitals:   09/14/19 1556  BP: 128/86  Pulse: 75  Resp: 20  Temp: 98.3 F (36.8 C)  TempSrc: Oral  SpO2: 98%  Weight: (!) 302 lb 12.8 oz (137.3 kg)  Height: 5\' 9"  (1.753 m)   Body mass index is 44.72 kg/m. Physical Exam Vitals signs reviewed.  Constitutional:      General: She is not in acute distress.    Appearance: She is obese. She is not ill-appearing.  HENT:     Head: Normocephalic.     Mouth/Throat:     Mouth: Mucous membranes are moist.     Pharynx: Oropharynx is clear. No oropharyngeal exudate or posterior oropharyngeal erythema.  Eyes:     General: No scleral icterus.       Right eye: No discharge.        Left eye: No discharge.     Extraocular Movements: Extraocular movements intact.     Conjunctiva/sclera: Conjunctivae  normal.     Pupils: Pupils are equal, round, and reactive to light.  Neck:     Musculoskeletal: Normal range of motion. No neck rigidity or muscular tenderness.  Cardiovascular:     Rate and Rhythm: Normal rate and regular rhythm.     Pulses: Normal pulses.     Heart sounds: Normal heart sounds. No murmur. No friction rub. No gallop.   Pulmonary:     Effort: Pulmonary effort is normal. No respiratory distress.     Breath sounds: Normal breath sounds. No wheezing, rhonchi or rales.  Chest:     Chest wall: No tenderness.  Abdominal:     General: Bowel sounds are normal. There is no distension.     Palpations: Abdomen is soft. There is no mass.     Tenderness: There is no abdominal tenderness. There is no right CVA tenderness, left CVA tenderness, guarding or rebound.  Musculoskeletal: Normal range of motion.        General: No swelling or tenderness.     Right lower leg: No edema.     Left lower leg: No edema.  Lymphadenopathy:     Cervical: No cervical adenopathy.  Skin:    General: Skin is warm and dry.     Coloration: Skin is not pale.      Findings: No bruising or erythema.  Neurological:     Mental Status: She is alert and oriented to person, place, and time.     Cranial Nerves: No cranial nerve deficit.     Sensory: No sensory deficit.     Motor: No weakness.     Gait: Gait normal.  Psychiatric:        Mood and Affect: Mood is depressed. Mood is not anxious. Affect is tearful.        Speech: Speech normal.        Behavior: Behavior normal.        Thought Content: Thought content normal.        Judgment: Judgment normal.    Labs reviewed: Recent Labs    07/03/19 2107 08/22/19 1540 09/11/19 0050  NA 138 138 138  K 3.5 4.3 3.5  CL 102 104 101  CO2 26 25 26   GLUCOSE 118* 120* 113*  BUN 14 11 10   CREATININE 0.96 0.96 1.01*  CALCIUM 8.9 9.0 9.2   Recent Labs    04/18/19 0154 06/25/19 1109 08/22/19 1540  AST 21 17 13   ALT 17 12 10   ALKPHOS 50 50  --   BILITOT 0.4 0.2* 0.2  PROT 6.8 7.0 6.8  ALBUMIN 3.8 3.8  --    Recent Labs    04/18/19 0154 06/25/19 1109 07/03/19 2107 08/22/19 1540 09/11/19 0050  WBC 5.1 4.4 7.8 5.0 5.6  NEUTROABS 2.5 2.5  --  3,115  --   HGB 11.7* 11.5* 11.6* 11.6* 13.3  HCT 37.3 36.8 38.3 36.1 42.1  MCV 84.8 88.5 88.2 84.5 89.0  PLT 216 199 229 240 199   Lab Results  Component Value Date   TSH 1.21 08/22/2019   Lab Results  Component Value Date   HGBA1C 6.1 (H) 08/22/2019   Lab Results  Component Value Date   CHOL 192 08/22/2019   HDL 53 08/22/2019   LDLCALC 118 (H) 08/22/2019   TRIG 105 08/22/2019   CHOLHDL 3.6 08/22/2019    Significant Diagnostic Results in last 30 days:  Dg Chest 2 View  Result Date: 09/10/2019 CLINICAL DATA:  Swelling in the right neck.  Shortness of breath started today. EXAM: CHEST - 2 VIEW COMPARISON:  07/03/2019 FINDINGS: The heart size and mediastinal contours are within normal limits. Both lungs are clear. The visualized skeletal structures are unremarkable. IMPRESSION: No active cardiopulmonary disease. Electronically Signed   By:  Van Clines M.D.   On: 09/10/2019 18:40    Assessment/Plan 1. Chest pain, unspecified type Rates 10/10  mid chest pain described as pressure.Non-radiating pain.Nitro given without any relief.Vital signs stable.Exam finding negative.Asprin not given due to allergies to ASA.  - nitroGLYCERIN (NITROSTAT) SL tablet 0.4 mg Send to ED for further evaluation.   2. Fatigue, unspecified type Afebrile.Send to ED due to chest pain.  3. Essential hypertension B/p stable.continue on current medication.send to ED due to complains of chest pain.  4. Generalized anxiety disorder Continue on Alprazolam 1 mg tablet at bedtime as needed    Family/ staff Communication: Reviewed plan of care with patient.Send to ED via EMS for evaluation of chest pain.   Labs/tests ordered: Send to ED   Sandrea Hughs, NP

## 2019-09-14 NOTE — Patient Instructions (Signed)
Send to ED via EMS

## 2019-09-15 ENCOUNTER — Emergency Department (HOSPITAL_COMMUNITY): Payer: PRIVATE HEALTH INSURANCE

## 2019-09-15 LAB — TROPONIN I (HIGH SENSITIVITY)
Troponin I (High Sensitivity): <2 ng/L (ref <18)
Troponin I (High Sensitivity): <2 ng/L (ref <18)

## 2019-09-15 MED ORDER — POTASSIUM CHLORIDE CRYS ER 20 MEQ PO TBCR
40.0000 meq | EXTENDED_RELEASE_TABLET | Freq: Once | ORAL | Status: AC
  Administered 2019-09-15: 40 meq via ORAL
  Filled 2019-09-15: qty 2

## 2019-09-15 MED ORDER — SODIUM CHLORIDE 0.9 % IV SOLN: 1000.0000 mL | INTRAVENOUS | Status: DC

## 2019-09-15 MED ORDER — PROCHLORPERAZINE EDISYLATE 10 MG/2ML IJ SOLN
10.0000 mg | Freq: Once | INTRAMUSCULAR | Status: AC
  Administered 2019-09-15: 10 mg via INTRAVENOUS
  Filled 2019-09-15: qty 2

## 2019-09-15 MED ORDER — SODIUM CHLORIDE 0.9 % IV BOLUS: 500.0000 mL | Freq: Once | INTRAVENOUS | Status: DC

## 2019-09-15 MED ORDER — SODIUM CHLORIDE 0.9 % IV BOLUS (SEPSIS)
1000.0000 mL | Freq: Once | INTRAVENOUS | Status: AC
  Administered 2019-09-15: 1000 mL via INTRAVENOUS

## 2019-09-15 MED ORDER — IOHEXOL 350 MG/ML SOLN
75.0000 mL | Freq: Once | INTRAVENOUS | Status: AC | PRN
  Administered 2019-09-15: 75 mL via INTRAVENOUS

## 2019-09-15 MED ORDER — DIPHENHYDRAMINE HCL 50 MG/ML IJ SOLN
25.0000 mg | Freq: Once | INTRAMUSCULAR | Status: AC
  Administered 2019-09-15: 25 mg via INTRAVENOUS
  Filled 2019-09-15: qty 1

## 2019-09-15 NOTE — ED Provider Notes (Signed)
[facility] EMERGENCY DEPARTMENT Provider Note   CSN: [id number] Arrival date & time: 09/14/19  1651     History   Chief Complaint Chief Complaint  Patient presents with  . Chest Pain  . Headache    HPI Victoria Moore is a 43 y.o. female.     HPI   Tight, crushing pain, hard, sharp pain in chest Headache feels like sitting in water and flu  Reports chest pain for one month, constant for one month, hurts to breath and move, any movement makes it worse, feels like it is a pulling pain  No change with positions Got nitro at office, maybe it helped some just a little bit but came back  Also describes spinning, began Thurs-Friday, feeling off balance, headache.   No double vision or visual field changes, both feel more blurry Left arm has numbness that comes and goes briefly, doesn't last long No other numbness or weakness No difficulty talking or walking. Dizziness worse sitting up. Not having dizziness right now. Has been coming  And going since Thurs/Fri. No problems walking.   Headaches, hx of migraines, has had dizziness and numbness in the past with migraines. Worse with bright lights, loud sounds. Has not been relieved by imitrex home meds  Both headache and chest pain 10/10 right now  Fam hx of CAD in 70s No smoking/etoh drugs  Has appt to see Cardiology  No long trips/estrogens  Htn, dm  Past Medical History:  Diagnosis Date  . ADHD   . Allergy   . Anemia   . Anxiety   . Arthritis   . Asthma   . Blood transfusion without reported diagnosis   . Chronic kidney disease   . Common migraine with intractable migraine 10/07/2016  . Depression   . Diabetes ([place])   . Edema   . GERD (gastroesophageal reflux disease)   . Herniated lumbar intervertebral disc   . Hilar adenopathy   . Hypertension   . Iron deficiency   . Migraine   . Prolonged capillary refill time   . Sarcoidosis    personal history of  . Seizures ([place])    history of    . Sleep apnea   . Spondylosis   . Vitamin D deficiency     Patient Active Problem List   Diagnosis Date Noted  . Leg pain 12/07/2018  . Low libido 11/27/2018  . Prolonged capillary refill time 10/04/2018  . Decreased pedal pulses 10/04/2018  . Spondylosis 09/15/2018  . Chronic low back pain with sciatica 09/15/2018  . Impaired fasting blood sugar 09/11/2018  . Weight gain 09/11/2018  . Edema 09/11/2018  . Frequency of urination 09/11/2018  . Right leg swelling 08/18/2018  . Mild sleep apnea 08/03/2018  . Hypokalemia 06/28/2018  . Medication side effect 06/22/2018  . Acute low back pain without sciatica 06/22/2018  . Personal history of sarcoidosis 05/26/2018  . Vitamin D deficiency 05/26/2018  . Encounter for health maintenance examination with abnormal findings 05/26/2018  . Depression, major, single episode, moderate ([place]) 05/26/2018  . Urinary tract infection without hematuria 05/26/2018  . Daytime sleepiness 05/26/2018  . Chest wall pain 05/26/2018  . GERD (gastroesophageal reflux disease) 05/26/2018  . Essential hypertension, benign 11/15/2016  . 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 Morbid (severe) obesity due to excess calories ([place]) 10/23/2016  . Cough variant asthma vs UACS  10/23/2016  . Common migraine with intractable migraine 10/07/2016  . Dyspnea 04/15/2016  . Hilar adenopathy 04/15/2016  .  Seizures ([place]) 03/11/2016  . Fibroids 03/11/2016  . Anemia 11/12/2014  . Iron deficiency 11/12/2014    Past Surgical History:  Procedure Laterality Date  . ABDOMINAL HYSTERECTOMY    . ESOPHAGEAL MANOMETRY N/A 09/05/2019   Procedure: ESOPHAGEAL MANOMETRY (EM);  Surgeon: Lavena Bullion, DO;  [facility];  Service: Gastroenterology;  Laterality: N/A;  . LAPAROSCOPIC OVARIAN CYSTECTOMY Left 03/11/2016   Procedure: LAPAROSCOPIC OVARIAN CYSTECTOMY;  Surgeon: Eldred Manges, MD;  Location: [facility];  Service: Gynecology;  Laterality: Left;  . LAPAROSCOPIC  VAGINAL HYSTERECTOMY WITH SALPINGECTOMY Bilateral 03/11/2016   Procedure: LAPAROSCOPIC ASSISTED VAGINAL HYSTERECTOMY WITH SALPINGECTOMY;  Surgeon: Eldred Manges, MD;  Location: [facility];  Service: Gynecology;  Laterality: Bilateral;  . [place] IMPEDANCE STUDY  09/05/2019   Procedure: PH IMPEDANCE STUDY;  Surgeon: Lavena Bullion, DO;  Location: [facility];  Service: Gastroenterology;;  . TUBAL LIGATION       OB History    Gravida  1   Para      Term      Preterm      AB      Living        SAB      TAB      Ectopic      Multiple      Live Births               Home Medications    Prior to Admission medications   Medication Sig Start Date End Date Taking? Authorizing Provider  albuterol (PROVENTIL) (2.5 MG/3ML) 0.083% nebulizer solution Take 2.5 mg by nebulization every 6 (six) hours as needed for wheezing or shortness of breath.   Yes [provider]  albuterol (VENTOLIN HFA) 108 (90 Base) MCG/ACT inhaler Inhale 1-2 puffs into the lungs every 4 (four) hours as needed for wheezing or shortness of breath. 08/10/19  Yes Ngetich, Dinah C, NP  ALPRAZolam (XANAX) 1 MG tablet Take 0.5-1 tablets (0.5-1 mg total) by mouth at bedtime as needed for anxiety. 08/10/19  Yes Ngetich, Dinah C, NP  amphetamine-dextroamphetamine (ADDERALL XR) 30 MG 24 hr capsule Take 1 capsule (30 mg total) by mouth daily as needed (ADHD). 08/23/19  Yes Ngetich, Dinah C, NP  Baclofen 5 MG TABS Take 5 mg by mouth 2 (two) times daily as needed (muscle spasm).    Yes [provider]  chlorthalidone (HYGROTON) 25 MG tablet Take 1 tablet (25 mg total) by mouth daily. 08/10/19  Yes Ngetich, Dinah C, NP  ferrous sulfate 325 (65 FE) MG tablet Take 325 mg by mouth daily with breakfast.   Yes [provider]  metFORMIN (GLUCOPHAGE) 500 MG tablet Take 1 tablet by mouth daily.    Yes [provider]  mometasone-formoterol (DULERA) 100-5 MCG/ACT AERO Inhale 2 puffs into the lungs 2  (two) times daily. 08/10/19  Yes Ngetich, Dinah C, NP  nortriptyline (PAMELOR) 10 MG capsule Take one capsule at night for one week, then take 2 capsules at night for one week, then take 3 capsules at night Patient taking differently: Take 10-20 mg by mouth See admin instructions. Take one capsule at night for one week, then take 2 capsules at night for one week, then take 3 capsules at night 09/10/19  Yes Kathrynn Ducking, MD  spironolactone (ALDACTONE) 25 MG tablet Take 1 tablet (25 mg total) by mouth daily. 08/10/19  Yes Ngetich, Dinah C, NP  SUMAtriptan (IMITREX) 100 MG tablet Take 1 tablet (100 mg total) by mouth  2 (two) times daily as needed for migraine. May repeat in 2 hours if headache persists or recurs. 09/10/19  Yes Kathrynn Ducking, MD    Family History Family History  Adopted: Yes  Problem Relation Age of Onset  . Other Son        Growing pains  . Asthma Mother   . Heart Problems Mother   . Migraines Mother   . COPD Mother   . Heart attack Mother   . Diabetes Father   . Peptic Ulcer Father   . Heart Problems Father   . COPD Father   . Heart attack Father     Social History Social History   Tobacco Use  . Smoking status: Former Smoker    Packs/day: 0.25    Years: 17.00    Pack years: 4.25    Types: Cigarettes    Quit date: 03/02/2013    Years since quitting: 6.5  . Smokeless tobacco: Never Used  Substance Use Topics  . Alcohol use: No    Alcohol/week: 0.0 standard drinks  . Drug use: Not Currently    Types: Marijuana    Comment: former - 6 yrs ago     Allergies   Aspirin   Review of Systems Review of Systems  Constitutional: Negative for fever.  HENT: Negative for sore throat.   Eyes: Positive for visual disturbance.  Respiratory: Negative for cough and shortness of breath.   Cardiovascular: Positive for chest pain.  Gastrointestinal: Negative for abdominal pain, constipation, diarrhea, nausea and vomiting.  Genitourinary: Negative for difficulty  urinating.  Musculoskeletal: Negative for back pain, gait problem and neck pain.  Skin: Negative for rash.  Neurological: Positive for dizziness and headaches. Negative for syncope, facial asymmetry, speech difficulty and weakness. Numbness: comes and goes.     Physical Exam Updated Vital Signs BP 110/75 (BP Location: Right Arm)   Pulse 64   Temp 98.9 F (37.2 C) (Oral)   Resp 16   Ht 6\' 1"  (1.854 m)   Wt 135.6 kg   LMP 02/19/2016   SpO2 100%   BMI 39.45 kg/m   Physical Exam Vitals signs and nursing note reviewed.  Constitutional:      General: She is not in acute distress.    Appearance: She is well-developed. She is not diaphoretic.  HENT:     Head: Normocephalic and atraumatic.  Eyes:     General: No visual field deficit.    Conjunctiva/sclera: Conjunctivae normal.  Neck:     Musculoskeletal: Normal range of motion.  Cardiovascular:     Rate and Rhythm: Normal rate and regular rhythm.     Heart sounds: Normal heart sounds. No murmur. No friction rub. No gallop.   Pulmonary:     Effort: Pulmonary effort is normal. No respiratory distress.     Breath sounds: Normal breath sounds. No wheezing or rales.  Abdominal:     General: There is no distension.     Palpations: Abdomen is soft.     Tenderness: There is no abdominal tenderness. There is no guarding.  Musculoskeletal:        General: No tenderness.  Skin:    General: Skin is warm and dry.     Findings: No erythema or rash.  Neurological:     Mental Status: She is alert and oriented to person, place, and time.     GCS: GCS eye subscore is 4. GCS verbal subscore is 5. GCS motor subscore is 6.  Cranial Nerves: No cranial nerve deficit, dysarthria or facial asymmetry.     Sensory: Sensory deficit: RUE with altered sensation in comparison to left, no other abnormalities.     Motor: Motor function is intact. No weakness or pronator drift.     Coordination: Coordination is intact. Coordination normal. Heel to Shin  Test normal.     Gait: Gait is intact.      ED Treatments / Results  Labs (all labs ordered are listed, but only abnormal results are displayed) Labs Reviewed  BASIC METABOLIC PANEL - Abnormal; Notable for the following components:      Result Value   Potassium 3.3 (*)    Glucose, Bld 134 (*)    Creatinine, Ser 1.17 (*)    GFR calc non Af Amer 56 (*)    All other components within normal limits  CBC  I-STAT BETA HCG BLOOD, ED ([facility], [facility], [facility] ONLY)  TROPONIN I (HIGH SENSITIVITY)  TROPONIN I (HIGH SENSITIVITY)  TROPONIN I (HIGH SENSITIVITY)  TROPONIN I (HIGH SENSITIVITY)    EKG EKG Interpretation  Date/Time:  Friday September 14 2019 17:02:54 EDT Ventricular Rate:  85 PR Interval:  148 QRS Duration: 84 QT Interval:  386 QTC Calculation: 459 R Axis:   7 Text Interpretation:  Normal sinus rhythm Normal ECG No significant change since last tracing Confirmed by Gareth Morgan [phone]) on 09/15/2019 7:13:34 AM   Radiology Dg Chest 2 View  Result Date: 09/14/2019 CLINICAL DATA:  Chest pain EXAM: CHEST - 2 VIEW COMPARISON:  09/10/2019 FINDINGS: The heart size and mediastinal contours are within normal limits. Both lungs are clear. Disc degenerative disease of the thoracic spine. IMPRESSION: No acute abnormality of the lungs. Electronically Signed   By: Eddie Candle M.D.   On: 09/14/2019 17:48    Procedures Procedures (including critical care time)  Medications Ordered in ED Medications  sodium chloride flush (NS) 0.9 % injection 3 mL (has no administration in time range)  sodium chloride 0.9 % bolus 1,000 mL (has no administration in time range)    Followed by  0.9 %  sodium chloride infusion (has no administration in time range)  potassium chloride SA (K-DUR) CR tablet 40 mEq (has no administration in time range)  prochlorperazine (COMPAZINE) injection 10 mg (10 mg Intravenous Given 09/15/19 0958)  diphenhydrAMINE (BENADRYL) injection 25 mg (25 mg Intravenous Given 09/15/19  0958)  iohexol (OMNIPAQUE) 350 MG/ML injection 75 mL (75 mLs Intravenous Contrast Given 09/15/19 1013)     Initial Impression / Assessment and Plan / ED Course  I have reviewed the triage vital signs and the nursing notes.  Pertinent labs & imaging results that were available during my care of the patient were reviewed by me and considered in my medical decision making (see chart for details).        43yo female with history of htn, DM, sarcoidosis, migraines presents with concern for chest pain and headache. Differential diagnosis for chest pain includes pulmonary embolus, dissection, pneumothorax, pneumonia, ACS, myocarditis, pericarditis.  EKG was done and evaluate by me and showed no acute ST changes and no signs of pericarditis. Chest x-ray was done and evaluated by me and radiology and showed no sign of pneumonia or pneumothorax. Patient is PERC negative and low risk [place] and have low suspicion for PE.  Patient is low risk HEART score 3 and had delta troponins x3 over 12 hours which were all negative. Given this evaluation, history and physical have low suspicion for pulmonary embolus,  pneumonia, ACS, myocarditis, pericarditis, dissection.  She does have chest wall tenderness and pain with movement which may represent msk component, although with long duration. Consider sarcoidosis related pain, other, recommend PCP and Cardiology follow up.  Regarding headache, has history of migraine and this is similar quality, however given severity and duration with other associated neurologic symptoms, will obtain CTA for further evaluation.  Her neurologic exam is normal with exception of altered sensation to right side, had reported intermittent left sided symptoms prior. Does have history of numbness with migraines in the past and given headache with photophobia/phonophobia similar to migraines with similar neurologic symptoms I do not feel further testing with MRI is indicated at this time.  She has  unfortunately been waiting in the waiting room for 14 hours prior to me seeing her.  Given headache cocktail, fluids with improvement in headache. CTA without acute findings. Suspect migraine. Recommend Cardiology and Neurology follow up.     [place] Abood was evaluated in Emergency Department on 09/15/2019 for the symptoms described in the history of present illness. She was evaluated in the context of the global COVID-19 pandemic, which necessitated consideration that the patient might be at risk for infection with the SARS-CoV-2 virus that causes COVID-19. Institutional protocols and algorithms that pertain to the evaluation of patients at risk for COVID-19 are in a state of rapid change based on information released by regulatory bodies including the CDC and federal and state organizations. These policies and algorithms were followed during the patient's care in the ED.   Final Clinical Impressions(s) / ED Diagnoses   Final diagnoses:  Nonspecific chest pain  Acute nonintractable headache, unspecified headache type  Other migraine without status migrainosus, not intractable    ED Discharge Orders    None       Gareth Morgan, MD 09/15/19 2055

## 2019-09-15 NOTE — ED Notes (Signed)
Patient transported to CT 

## 2019-09-15 NOTE — ED Notes (Signed)
Gave pt [place] sand which water and juice.

## 2019-09-15 NOTE — ED Notes (Signed)
Pt verbalized understanding of discharge instructions and follow up care. IV removed and bleeding controlled.

## 2019-09-15 NOTE — ED Notes (Signed)
Pt returned from CT °

## 2019-09-16 ENCOUNTER — Ambulatory Visit (HOSPITAL_COMMUNITY): Admission: RE | Admit: 2019-09-16 | Payer: PRIVATE HEALTH INSURANCE | Source: Ambulatory Visit

## 2019-09-16 ENCOUNTER — Encounter (HOSPITAL_COMMUNITY): Payer: Self-pay

## 2019-09-17 NOTE — Telephone Encounter (Signed)
Message routed to Dinah Ngetich, NP. 

## 2019-09-17 NOTE — Telephone Encounter (Signed)
Message routed to Ngetich, Dinah C, NP  

## 2019-09-19 ENCOUNTER — Encounter: Payer: Self-pay | Admitting: Orthopaedic Surgery

## 2019-09-19 ENCOUNTER — Other Ambulatory Visit: Payer: Self-pay

## 2019-09-19 ENCOUNTER — Ambulatory Visit (INDEPENDENT_AMBULATORY_CARE_PROVIDER_SITE_OTHER): Payer: PRIVATE HEALTH INSURANCE | Admitting: Orthopaedic Surgery

## 2019-09-19 DIAGNOSIS — M5136 Other intervertebral disc degeneration, lumbar region: Secondary | ICD-10-CM | POA: Insufficient documentation

## 2019-09-19 NOTE — Addendum Note (Signed)
Addended by: Meyer Cory on: 09/19/2019 09:18 AM   Modules accepted: Orders

## 2019-09-19 NOTE — Progress Notes (Signed)
Office Visit Note   Patient: Victoria Moore           Date of Birth: 10-05-1976           MRN: [id number] Visit Date: 09/19/2019              Requested by: Sandrea Hughs, NP [address] [place],  [place] [address] PCP: Sandrea Hughs, NP   Assessment & Plan: Visit Diagnoses:  1. Other intervertebral disc degeneration, lumbar region     Plan: Previous labs x-rays previous lumbar MRI 2017, cervical spine x-rays reviewed with patient and discussed.  I recommend she talk with her PCP about treatment with the depression medicine which may help her pain.  She needs to continue work on walking daily we will set up some physical therapy for the lumbar spine.  I will check her back again in 6 weeks we can consider either repeating MRI scan or a single epidural injection at the L5-S1 level where she has some disc degeneration.  She is done well losing some weight I encouraged her to continue with this to help with her back pain.  Follow-Up Instructions: No follow-ups on file.   Orders:  No orders of the defined types were placed in this encounter.  No orders of the defined types were placed in this encounter.     Procedures: No procedures performed   Clinical Data: No additional findings.   Subjective: Chief Complaint  Patient presents with  . Neck - Pain  . Lower Back - Pain    HPI 43 year old female seen with history of chronic back pain x1 year.  She states she actually had some therapy 2 or 3 years ago for her back she states she has pain in her neck pain in her shoulders arms legs pain every place.  She states she has had ED visits for times in the last 3 weeks.  She has a MRI  scheduled for her  head and has a history of seizures.  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  She wakes up for alarm goes off.  She is to walk 4 miles a day with her husband states she can only walk a mile and is limited by pain she has anxiety.  Morbid obesity she states she  is lost weight from 317 down to 299.  Review of Systems positive for seizures, depression, anxiety, hypertension, GERD with recent chest pain diagnosed as reflux.  She is waiting on GI follow-up.  Rheumatoid factor sed rate in the past year were normal.  14 point systems otherwise negative.   Objective: Vital Signs: BP (!) 134/93   Pulse 74   Ht 6\' 1"  (1.854 m)   Wt 299 lb (135.6 kg)   LMP 02/19/2016   BMI 39.45 kg/m   Physical Exam Constitutional:      Appearance: She is well-developed.  HENT:     Head: Normocephalic.     Right Ear: External ear normal.     Left Ear: External ear normal.  Eyes:     Pupils: Pupils are equal, round, and reactive to light.  Neck:     Thyroid: No thyromegaly.     Trachea: No tracheal deviation.  Cardiovascular:     Rate and Rhythm: Normal rate.  Pulmonary:     Effort: Pulmonary effort is normal.  Abdominal:     Palpations: Abdomen is soft.  Skin:    General: Skin is warm and dry.  Neurological:  Mental Status: She is alert and oriented to person, place, and time.  Psychiatric:        Behavior: Behavior normal.     Ortho Exam patient has 1+ upper and lower extremity reflexes negative impingement shoulders tenderness over the supraspinatus as well as brachial plexus negative Spurling.  She complains of foot numbness from ankle down with straight leg raising 90 degrees negative logroll of the hips knee and ankle jerk are 1+.  She is able to heel and toe walk across the room and complains of some pain when she toe walks in her back.  No calf atrophy negative [place]oman.  Peroneals are strong. Specialty Comments:  No specialty comments available.  Imaging: CLINICAL DATA:  Sciatica right side  EXAM: MRI LUMBAR SPINE WITHOUT CONTRAST  TECHNIQUE: Multiplanar, multisequence MR imaging of the lumbar spine was performed. No intravenous contrast was administered.  COMPARISON:  CT lumbar 10/09/2016  FINDINGS: Segmentation:  Normal   Alignment:  Normal  Vertebrae: Negative for fracture or mass. 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  Conus medullaris: Extends to the L1-2 level and appears normal.  Paraspinal and other soft tissues: Paraspinous muscles normal. Retroperitoneal structures normal  Disc levels:  L1-2:  Negative  L2-3:  Negative  L3-4:  Negative  L4-5:  Mild facet degeneration.  Negative for spinal stenosis  L5-S1: Mild disc degeneration and disc bulging. Mild facet degeneration. Moderate foraminal by lack bilaterally which could cause L5 nerve root impingement.  IMPRESSION: Disc and facet degeneration at L5-S1 causing foraminal narrowing bilaterally. Possible L5 nerve root impingement bilaterally.   Electronically Signed   By: Franchot Gallo M.D.   On: 11/07/2016 18:09   PMFS History: Patient Active Problem List   Diagnosis Date Noted  . Other intervertebral disc degeneration, lumbar region 09/19/2019  . Leg pain 12/07/2018  . Low libido 11/27/2018  . Prolonged capillary refill time 10/04/2018  . Decreased pedal pulses 10/04/2018  . Spondylosis 09/15/2018  . Chronic low back pain with sciatica 09/15/2018  . Impaired fasting blood sugar 09/11/2018  . Weight gain 09/11/2018  . Edema 09/11/2018  . Frequency of urination 09/11/2018  . Right leg swelling 08/18/2018  . Mild sleep apnea 08/03/2018  . Hypokalemia 06/28/2018  . Medication side effect 06/22/2018  . Acute low back pain without sciatica 06/22/2018  . Personal history of sarcoidosis 05/26/2018  . Vitamin D deficiency 05/26/2018  . Encounter for health maintenance examination with abnormal findings 05/26/2018  . Depression, major, single episode, moderate ([place]) 05/26/2018  . Urinary tract infection without hematuria 05/26/2018  . Daytime sleepiness 05/26/2018  . Chest wall pain 05/26/2018  . GERD (gastroesophageal reflux disease) 05/26/2018  . Essential hypertension, benign 11/15/2016  .  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 Morbid (severe) obesity due to excess calories ([place]) 10/23/2016  . Cough variant asthma vs UACS  10/23/2016  . Common migraine with intractable migraine 10/07/2016  . Dyspnea 04/15/2016  . Hilar adenopathy 04/15/2016  . Seizures ([place]) 03/11/2016  . Fibroids 03/11/2016  . Anemia 11/12/2014  . Iron deficiency 11/12/2014   Past Medical History:  Diagnosis Date  . ADHD   . Allergy   . Anemia   . Anxiety   . Arthritis   . Asthma   . Blood transfusion without reported diagnosis   . Chronic kidney disease   . Common migraine with intractable migraine 10/07/2016  . Depression   . Diabetes ([place])   . Edema   . GERD (gastroesophageal reflux disease)   .  Herniated lumbar intervertebral disc   . Hilar adenopathy   . Hypertension   . Iron deficiency   . Migraine   . Prolonged capillary refill time   . Sarcoidosis    personal history of  . Seizures ([place])    history of   . Sleep apnea   . Spondylosis   . Vitamin D deficiency     Family History  Adopted: Yes  Problem Relation Age of Onset  . Other Son        Growing pains  . Asthma Mother   . Heart Problems Mother   . Migraines Mother   . COPD Mother   . Heart attack Mother   . Diabetes Father   . Peptic Ulcer Father   . Heart Problems Father   . COPD Father   . Heart attack Father     Past Surgical History:  Procedure Laterality Date  . ABDOMINAL HYSTERECTOMY    . ESOPHAGEAL MANOMETRY N/A 09/05/2019   Procedure: ESOPHAGEAL MANOMETRY (EM);  Surgeon: Lavena Bullion, DO;  [facility];  Service: Gastroenterology;  Laterality: N/A;  . LAPAROSCOPIC OVARIAN CYSTECTOMY Left 03/11/2016   Procedure: LAPAROSCOPIC OVARIAN CYSTECTOMY;  Surgeon: Eldred Manges, MD;  Location: [facility];  Service: Gynecology;  Laterality: Left;  . LAPAROSCOPIC VAGINAL HYSTERECTOMY WITH SALPINGECTOMY Bilateral 03/11/2016   Procedure: LAPAROSCOPIC ASSISTED VAGINAL HYSTERECTOMY WITH SALPINGECTOMY;   Surgeon: Eldred Manges, MD;  Location: [facility];  Service: Gynecology;  Laterality: Bilateral;  . [place] IMPEDANCE STUDY  09/05/2019   Procedure: [place] IMPEDANCE STUDY;  Surgeon: Lavena Bullion, DO;  Location: [facility];  Service: Gastroenterology;;  . TUBAL LIGATION     Social History   Occupational History  . Occupation: Company secretary  Tobacco Use  . Smoking status: Former Smoker    Packs/day: 0.25    Years: 17.00    Pack years: 4.25    Types: Cigarettes    Quit date: 03/02/2013    Years since quitting: 6.5  . Smokeless tobacco: Never Used  Substance and Sexual Activity  . Alcohol use: No    Alcohol/week: 0.0 standard drinks  . Drug use: Not Currently    Types: Marijuana    Comment: former - 6 yrs ago  . Sexual activity: Not on file

## 2019-09-20 ENCOUNTER — Ambulatory Visit (HOSPITAL_COMMUNITY)
Admission: RE | Admit: 2019-09-20 | Discharge: 2019-09-20 | Disposition: A | Discharge status: Home / Self Care | Payer: PRIVATE HEALTH INSURANCE | Source: Ambulatory Visit | Attending: Neurology | Admitting: Neurology

## 2019-09-20 ENCOUNTER — Encounter (HOSPITAL_COMMUNITY): Payer: Self-pay

## 2019-09-20 ENCOUNTER — Other Ambulatory Visit: Payer: Self-pay

## 2019-09-20 DIAGNOSIS — K219 Gastro-esophageal reflux disease without esophagitis: Secondary | ICD-10-CM

## 2019-09-20 DIAGNOSIS — G4489 Other headache syndrome: Secondary | ICD-10-CM

## 2019-09-20 MED ORDER — FAMOTIDINE 40 MG PO TABS: 40.0000 mg | ORAL_TABLET | Freq: Two times a day (BID) | ORAL | 5 refills | Status: DC

## 2019-09-20 NOTE — Telephone Encounter (Signed)
Message routed to Ngetich, Dinah C, NP  

## 2019-09-21 ENCOUNTER — Ambulatory Visit (INDEPENDENT_AMBULATORY_CARE_PROVIDER_SITE_OTHER): Payer: PRIVATE HEALTH INSURANCE | Admitting: Gastroenterology

## 2019-09-21 ENCOUNTER — Encounter: Payer: Self-pay | Admitting: *Deleted

## 2019-09-21 ENCOUNTER — Ambulatory Visit: Payer: PRIVATE HEALTH INSURANCE | Admitting: Internal Medicine

## 2019-09-21 VITALS — BP 128/82 | HR 75 | Temp 99.0°F | Ht 72.0 in | Wt 307.0 lb

## 2019-09-21 DIAGNOSIS — K219 Gastro-esophageal reflux disease without esophagitis: Secondary | ICD-10-CM | POA: Diagnosis not present

## 2019-09-21 DIAGNOSIS — R112 Nausea with vomiting, unspecified: Secondary | ICD-10-CM

## 2019-09-21 DIAGNOSIS — K449 Diaphragmatic hernia without obstruction or gangrene: Secondary | ICD-10-CM | POA: Diagnosis not present

## 2019-09-21 DIAGNOSIS — Z6841 Body Mass Index (BMI) 40.0 and over, adult: Secondary | ICD-10-CM

## 2019-09-21 MED ORDER — CITALOPRAM HYDROBROMIDE 10 MG PO TABS: 10.0000 mg | ORAL_TABLET | Freq: Every day | ORAL | 0 refills | Status: DC

## 2019-09-21 MED ORDER — CITALOPRAM HYDROBROMIDE 20 MG PO TABS: 20.0000 mg | ORAL_TABLET | Freq: Every day | ORAL | 3 refills | Status: DC

## 2019-09-21 NOTE — Telephone Encounter (Signed)
High risk or very high risk warning populated when attempting to refill medication. RX request sent to PCP for review and approval if warranted.   

## 2019-09-21 NOTE — Patient Instructions (Signed)
You have been scheduled for an endoscopy. Please follow written instructions given to you at your visit today. If you use inhalers (even only as needed), please bring them with you on the day of your procedure. Your physician has requested that you go to www.startemmi.com and enter the access code given to you at your visit today. This web site gives a general overview about your procedure. However, you should still follow specific instructions given to you by our office regarding your preparation for the procedure.  We have given you samples of the following medication to take: Dexilant 60 mg once daily  You have been scheduled for an appointment with Dr Redmond Pulling at [facility]. Your appointment is on _______________ at _______________. Please arrive at __________________ for registration. Make certain to bring a list of current medications, including any over the counter medications or vitamins. Also bring your co-pay if you have one as well as your insurance cards. [facility] is located at [address]. Should you need to reschedule your appointment, please contact them at [phone].  If you are age 68 or older, your body mass index should be between 23-30. Your Body mass index is 41.64 kg/m. If this is out of the aforementioned range listed, please consider follow up with your Primary Care Provider.  If you are age 63 or younger, your body mass index should be between 19-25. Your Body mass index is 41.64 kg/m. If this is out of the aformentioned range listed, please consider follow up with your Primary Care Provider.

## 2019-09-21 NOTE — Progress Notes (Signed)
P  Chief Complaint:   GERD, dysphagia, nausea/vomiting  GI History: Kennetha Womackis a 43 y.o.female with a history of reflux, starting in 2018, along with dysphagia starting in 2019. Index sxs HB, regurgitation with infrequent raspy voice and dry cough.   Sleeps in a recliner at night due to regurgitation and waterbrash. She has trialed multiple acid suppressants/antacids. Zantac previously worked buteventually lost efficacy. No benefit with OTC Nexium.Protonix 40 mg/day initially worked, but again lost efficacy and she increased to TID without improvement at all.  Changed to Dexilant 60 mg/day, which worked for about a month, then lost efficacy (along with cost prohibitive).  She has since reverted back to Nexium 60 mg/day, but again with breakthrough symptoms of heartburn, regurgitation, sour brash. Worse with supine and post prandial.   Dysphagia occurs intermittently, solids > liquids,pointing to mid chest. No history of food impaction.No changes in weight and no fever, chills, night sweats.   She has had infrequent episodes of nausea/vomiting.  Additionally, she has a history of iron deficiency anemia dating back to 2015 with iron 10 and ferritin 3 at that time. Hemoglobin as low as 6.8 in 2015, requiring admission with pRBCtransfusion. Hysterectomy for menorrhagia and fibroids in March 2017,with overall improvement in baseline hemoglobin and iron indices.Peak of 12.4 in November 2018,and has mildly down trended since that time to 10.8 in 08/2017 despite ongoing oral iron therapy.Hgbrange10.8-12.7 since hysterectomy. MCV improved from 02/2016 (75) to stable mid 80's.Ironindices improved, but still requiring BID ferrous sulfate. No overt GIB.  EGD in 09/2018 unrevealing.  06/2019: H/H 11.6/38.3 with MCV 88. 07/2018:Iron 46, TIBC 360, ferritin 10, iron sat 13%. Vitamin D 20. Normal B12/folate  Endoscopic history: -Colonoscopy (08/2019, Dr. Bryan Lemma): 4 benign  polyps in the sigmoid, 2 in the rectum, internal hemorrhoids.  Otherwise normal.  Repeat in 10 years. -EGD (09/2018, Dr. Bryan Lemma): Schatzki's ring, dilated with 79 Fr (no ring) then fracture with forceps, normal Z line, Hill grade 2 valve, mild non-H. pylori gastritis, normal duodenum - EM (08/2019): 2.1 cm HH, low EG resting pressure, but otherwise normal with appropriate relaxation of the LES and normal motility -pH/impedance (08/2019, off PPI): Significant acid reflux with pH <4 18.9% of the time and JD score 72.4.  Negative symptom correlation.  Overall normal number of reflux episodes at 50, with 34 being acid refluxate.  However significant time of acid reflux certainly consistent with GERD  HPI:     Patient is a 43 y.o. female presenting [facility] for follow-up.  She continues to have significant reflux symptoms despite ongoing high-dose acid suppression therapy.  Was last seen by me in 07/2019, and at that time continued Nexium (was taking 60 mg/day) and referred for EM and pH/impedance testing, which demonstrated significant esophageal acid exposure with JD score 72.4 and pH <4 18.9% of the time.  Recommended referral to Dr. Redmond Pulling at [place] for consideration of hiatal hernia repair, antireflux surgical options to include bypass given BMI >35.  Since that appointment she has been to the ER 3 times.  Once for neck swelling and two more for chest pain/pressure.  Has had negative extensive work-up, to include CT angio neck/head, CXR, lab panel.  Today she states no improvement since adding Pepcid BID. Continues to have perceived neck swelling and pyrosis, pain.  Continues to have intermittent nausea/vomiting.  No hematemesis or melena.   Review of systems:     No chest pain, no SOB, no fevers, no urinary sx  Past Medical History:  Diagnosis Date  . ADHD   . Allergy   . Anemia   . Anxiety   . Arthritis   . Asthma   . Blood transfusion without reported diagnosis   .  Chronic kidney disease   . Common migraine with intractable migraine 10/07/2016  . Depression   . Diabetes ([place])   . Edema   . GERD (gastroesophageal reflux disease)   . Herniated lumbar intervertebral disc   . Hiatal hernia   . Hilar adenopathy   . Hypertension   . Iron deficiency   . Migraine   . Prolonged capillary refill time   . Sarcoidosis    personal history of  . Seizures ([place])    history of   . Sleep apnea   . Spondylosis   . Vitamin D deficiency     Patient's surgical history, family medical history, social history, medications and allergies were all reviewed in Epic    Current Outpatient Medications  Medication Sig Dispense Refill  . albuterol (PROVENTIL) (2.5 MG/3ML) 0.083% nebulizer solution Take 2.5 mg by nebulization every 6 (six) hours as needed for wheezing or shortness of breath.    Marland Kitchen albuterol (VENTOLIN HFA) 108 (90 Base) MCG/ACT inhaler Inhale 1-2 puffs into the lungs every 4 (four) hours as needed for wheezing or shortness of breath. 8 g 3  . ALPRAZolam (XANAX) 1 MG tablet Take 0.5-1 tablets (0.5-1 mg total) by mouth at bedtime as needed for anxiety. 30 tablet 0  . amphetamine-dextroamphetamine (ADDERALL XR) 30 MG 24 hr capsule Take 1 capsule (30 mg total) by mouth daily as needed (ADHD). 30 capsule 0  . Baclofen 5 MG TABS Take 5 mg by mouth 2 (two) times daily as needed (muscle spasm).     . chlorthalidone (HYGROTON) 25 MG tablet Take 1 tablet (25 mg total) by mouth daily. 30 tablet 3  . famotidine (PEPCID) 40 MG tablet Take 1 tablet (40 mg total) by mouth 2 (two) times daily. 180 tablet 5  . ferrous sulfate 325 (65 FE) MG tablet Take 325 mg by mouth daily with breakfast.    . metFORMIN (GLUCOPHAGE) 500 MG tablet Take 1 tablet by mouth daily.     . mometasone-formoterol (DULERA) 100-5 MCG/ACT AERO Inhale 2 puffs into the lungs 2 (two) times daily. 13 g 3  . nortriptyline (PAMELOR) 10 MG capsule Take one capsule at night for one week, then take 2 capsules at  night for one week, then take 3 capsules at night (Patient taking differently: Take 10-20 mg by mouth See admin instructions. Take one capsule at night for one week, then take 2 capsules at night for one week, then take 3 capsules at night) 90 capsule 3  . spironolactone (ALDACTONE) 25 MG tablet Take 1 tablet (25 mg total) by mouth daily. 30 tablet 3  . SUMAtriptan (IMITREX) 100 MG tablet Take 1 tablet (100 mg total) by mouth 2 (two) times daily as needed for migraine. May repeat in 2 hours if headache persists or recurs. 10 tablet 2   Current Facility-Administered Medications  Medication Dose Route Frequency Provider Last Rate Last Dose  . nitroGLYCERIN (NITROSTAT) SL tablet 0.4 mg  0.4 mg Sublingual Q5 min PRN Ngetich, Dinah C, NP   0.4 mg at 09/14/19 1621    Physical Exam:     BP 128/82 (BP Location: Left Arm, Patient Position: Sitting, Cuff Size: Large)   Pulse 75   Temp 99 F (37.2 C) (Oral)   Ht  6' (1.829 m)   Wt (!) 307 lb (139.3 kg)   LMP 02/19/2016   BMI 41.64 kg/m   GENERAL:  Pleasant female in NAD PSYCH: : Cooperative, normal affect EENT:  conjunctiva pink, mucous membranes moist, neck supple without masses CARDIAC:  RRR, no murmur heard, no peripheral edema PULM: Normal respiratory effort, lungs CTA bilaterally, no wheezing ABDOMEN:  Nondistended, soft, nontender. No obvious masses, no hepatomegaly,  normal bowel sounds SKIN:  turgor, no lesions seen Musculoskeletal:  Normal muscle tone, normal strength NEURO: Alert and oriented x 3, no focal neurologic deficits   IMPRESSION and PLAN:    1) GERD 2) Hiatal hernia 3) Nausea/Vomiting  - Provided samples of Dexilant -Resume Pepcid as currently prescribed -Resume antireflux lifestyle/dietary modifications - Previously placed referral to Dr. Redmond Pulling at [place]. Will f/u on status of this referral -Repeat EGD now to assess for erosive esophagitis given change in symptoms, particularly if considering surgical intervention -  RTC after appt with Dr. Redmond Pulling - Not a candidate for TIF due to BMI  4) Iron deficiency: - Could be due to high-dose acid suppression medications causing malabsorption.  EGD and colonoscopy otherwise largely unrevealing - Repeat iron indices in 2 to 3 months       I spent a total of 15 minutes of face-to-face time with the patient. Greater than 50% of the time was spent counseling and coordinating care.   [place] ,DO, FACG 09/21/2019, 3:08 PM

## 2019-09-24 ENCOUNTER — Ambulatory Visit: Payer: PRIVATE HEALTH INSURANCE | Admitting: Internal Medicine

## 2019-09-26 ENCOUNTER — Telehealth: Payer: Self-pay | Admitting: *Deleted

## 2019-09-26 NOTE — Telephone Encounter (Signed)
I have attempted to reach patient to discuss [facility] referral but mailbox is full. I will attempt to reach again at a later time.  I contacted [facility] regarding Victoria Moore's referral to Dr Redmond Pulling for consideration of hernia repair, antireflux surgical options and possible bypass due to BMI >35 (see esophageal manometry result from 09/05/19) and they indicate that they have attempted to reach patient but voicemail has been full and they do not accept her insurance ([employer] [place] [employer]) so she would be self pay. Of note, I have attempted to see which surgeons [employer] would cover and from what I can tell, surgeons in [facility] they [facility] are at the [facility] and surgeons they cover in [facility] are Vascular surgery. I do not see any true "general surgeons" available for her plan, so it may be something she would need to petition her insurance about.

## 2019-09-27 ENCOUNTER — Telehealth: Payer: Self-pay | Admitting: Gastroenterology

## 2019-09-27 NOTE — Telephone Encounter (Signed)
FYI-please see previous messages

## 2019-09-27 NOTE — Telephone Encounter (Signed)
Patient called questioning what her referral is for. I advised it is for consideration of hernia repair, antireflux surgical options, and possible gastric bypass. She indicates that she is trying to find covered surgeon on her health plan.

## 2019-09-27 NOTE — Telephone Encounter (Signed)
Please see previous messages

## 2019-09-27 NOTE — Telephone Encounter (Signed)
I have spoken to patient who indicates that she will call [facility] to schedule an appointment even if she needs to be self pay because she know she needs surgery. She is advised she probably needs to call her insurance regarding covered surgeons on her plan as well.

## 2019-10-02 ENCOUNTER — Other Ambulatory Visit: Payer: Self-pay | Admitting: Neurology

## 2019-10-03 ENCOUNTER — Ambulatory Visit: Payer: PRIVATE HEALTH INSURANCE | Admitting: Gastroenterology

## 2019-10-04 ENCOUNTER — Other Ambulatory Visit: Payer: Self-pay

## 2019-10-04 DIAGNOSIS — Z1159 Encounter for screening for other viral diseases: Secondary | ICD-10-CM

## 2019-10-04 NOTE — Progress Notes (Signed)
covi

## 2019-10-06 ENCOUNTER — Other Ambulatory Visit: Payer: Self-pay | Admitting: Family

## 2019-10-08 ENCOUNTER — Ambulatory Visit (INDEPENDENT_AMBULATORY_CARE_PROVIDER_SITE_OTHER): Payer: PRIVATE HEALTH INSURANCE | Admitting: Family

## 2019-10-08 ENCOUNTER — Encounter: Payer: Self-pay | Admitting: Gastroenterology

## 2019-10-08 ENCOUNTER — Other Ambulatory Visit: Payer: Self-pay

## 2019-10-08 VITALS — BP 128/76 | HR 67 | Temp 98.6°F | Ht 72.0 in | Wt 307.8 lb

## 2019-10-08 DIAGNOSIS — R42 Dizziness and giddiness: Secondary | ICD-10-CM | POA: Diagnosis not present

## 2019-10-08 DIAGNOSIS — F321 Major depressive disorder, single episode, moderate: Secondary | ICD-10-CM

## 2019-10-08 MED ORDER — CITALOPRAM HYDROBROMIDE 20 MG PO TABS: 20.0000 mg | ORAL_TABLET | Freq: Every day | ORAL | 3 refills | Status: DC

## 2019-10-08 MED ORDER — MECLIZINE HCL 25 MG PO TABS: 25.0000 mg | ORAL_TABLET | Freq: Three times a day (TID) | ORAL | 0 refills | Status: DC | PRN

## 2019-10-08 NOTE — Patient Instructions (Signed)

## 2019-10-08 NOTE — Progress Notes (Signed)
Cardiology Office Note:    Date:  10/10/2019   ID:  Victoria Moore, DOB 04-07-1976, MRN [id number]  PCP:  Sandrea Hughs, NP  Cardiologist:     Electrophysiologist:  None   Referring MD: Sandrea Hughs, NP   Chief Complaint  Patient presents with  . Chest Pain    History of Present Illness:    Victoria Moore is a 43 y.o. female with a hx of chest pain She was seen in the ER and was referred to see [place] by Dr. Billy Fischer for further evaluation of this chest pain   Symptoms are a crushing cp. Present for a month 4 days ago.  Noticed her HR is a bit high ( 119)   Dizziness  Walks 3-5 daily . Cp is worse with walking and movement  Has palpitations when she lies down .   CP worsens with twisting / turning of her torso   Pain does not keep her from walking .  Associated with dyspnea.   No syncope   Hx of HTN, DM    Was just diagnosted with DM .  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   Past Medical History:  Diagnosis Date  . Acute low back pain without sciatica 06/22/2018  . ADHD   . Allergy   . Anemia   . Anxiety   . Arthritis   . 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 Fibromyalgia, doubt sarcoid   . Asthma   . Blood transfusion without reported diagnosis   . Chest wall pain 05/26/2018  . Chronic kidney disease   . Chronic low back pain with sciatica 09/15/2018  . Common migraine with intractable migraine 10/07/2016  . Cough variant asthma vs UACS  10/23/2016   - Spirometry 04/15/2016  Very truncated exp loop effort dep portion only  - Allergy profile 10/22/2016 >  Eos 0.2/  IgE  52 RAST POS grass/trees/ragweed  10/22/2016  After extensive coaching HFA effectiveness =    75% try duelra 100 2bid > improved  . Daytime sleepiness 05/26/2018  . Decreased pedal pulses 10/04/2018  . Depression   . Depression, major, single episode, moderate ([place]) 05/26/2018  . Diabetes ([place])    . Dyspnea 04/15/2016   04/15/2016  Walked RA x 3 laps @ 185 ft each stopped due to  End of study, nl pace, no sob or desat    - Spirometry 04/15/2016  Very truncated exp loop effort dep portion only  - 10/22/2016  Walked RA x 3 laps @ 185 ft each stopped due to  End of study, slow pace, min sob/ no desat - full pfts rec 10/22/2016 >>>      . Edema   . Essential hypertension, benign 11/15/2016  . Fibroids 03/11/2016  . Frequency of urination 09/11/2018  . GERD (gastroesophageal reflux disease)   . Herniated lumbar intervertebral disc   . Hiatal hernia   . Hilar adenopathy   . Hypertension   . Hypokalemia 06/28/2018  . Impaired fasting blood sugar 09/11/2018  . Iron deficiency   . Leg pain 12/07/2018  . Low libido 11/27/2018  . Migraine   . Mild sleep apnea 08/03/2018   HST 06/20/18  AHI  8.1 / snoring with 02 nadir 80% >  08/03/2018 rec sleep medicine consultation   . Morbid (severe) obesity due to excess calories ([place]) 10/23/2016  . Personal  history of sarcoidosis 05/26/2018  . Prolonged capillary refill time   . Right leg swelling 08/18/2018  . Sarcoidosis    personal history of  . Seizures ([place])    history of   . Sleep apnea   . Spondylosis   . Vitamin D deficiency     Past Surgical History:  Procedure Laterality Date  . ABDOMINAL HYSTERECTOMY    . ESOPHAGEAL MANOMETRY N/A 09/05/2019   Procedure: ESOPHAGEAL MANOMETRY (EM);  Surgeon: Lavena Bullion, DO;  Location: [facility];  Service: Gastroenterology;  Laterality: N/A;  . LAPAROSCOPIC OVARIAN CYSTECTOMY Left 03/11/2016   Procedure: LAPAROSCOPIC OVARIAN CYSTECTOMY;  Surgeon: Eldred Manges, MD;  Location: [facility];  Service: Gynecology;  Laterality: Left;  . LAPAROSCOPIC VAGINAL HYSTERECTOMY WITH SALPINGECTOMY Bilateral 03/11/2016   Procedure: LAPAROSCOPIC ASSISTED VAGINAL HYSTERECTOMY WITH SALPINGECTOMY;  Surgeon: Eldred Manges, MD;  Location: [facility];  Service: Gynecology;  Laterality: Bilateral;  . [place] IMPEDANCE STUDY  09/05/2019    Procedure: [place] IMPEDANCE STUDY;  Surgeon: Lavena Bullion, DO;  Location: [facility];  Service: Gastroenterology;;  . TUBAL LIGATION      Current Medications: Current Meds  Medication Sig  . albuterol (PROVENTIL) (2.5 MG/3ML) 0.083% nebulizer solution Take 2.5 mg by nebulization every 6 (six) hours as needed for wheezing or shortness of breath.  Marland Kitchen albuterol (VENTOLIN HFA) 108 (90 Base) MCG/ACT inhaler Inhale 1-2 puffs into the lungs every 4 (four) hours as needed for wheezing or shortness of breath.  . ALPRAZolam (XANAX) 1 MG tablet Take 0.5-1 tablets (0.5-1 mg total) by mouth at bedtime as needed for anxiety.  Marland Kitchen amphetamine-dextroamphetamine (ADDERALL XR) 30 MG 24 hr capsule Take 1 capsule (30 mg total) by mouth daily as needed (ADHD).  . chlorthalidone (HYGROTON) 25 MG tablet Take 1 tablet (25 mg total) by mouth daily.  . citalopram (CELEXA) 20 MG tablet Take 1 tablet (20 mg total) by mouth daily.  . famotidine (PEPCID) 40 MG tablet Take 1 tablet (40 mg total) by mouth 2 (two) times daily.  . ferrous sulfate 325 (65 FE) MG tablet Take 325 mg by mouth daily with breakfast.  . meclizine (ANTIVERT) 25 MG tablet Take 1 tablet (25 mg total) by mouth 3 (three) times daily as needed for dizziness.  . metFORMIN (GLUCOPHAGE) 500 MG tablet Take 1 tablet by mouth daily.   . mometasone-formoterol (DULERA) 100-5 MCG/ACT AERO Inhale 2 puffs into the lungs 2 (two) times daily.  . nortriptyline (PAMELOR) 10 MG capsule TAKE 1 CAPSULE AT NIGHT FOR ONE WEEK, 2 CAPSULES AT NIGHT FOR ONE WEEK, THEN 3 CAPSULES AT NIGHT  . spironolactone (ALDACTONE) 25 MG tablet Take 1 tablet (25 mg total) by mouth daily.   Current Facility-Administered Medications for the 10/09/19 encounter (Office Visit) with , Wonda Cheng, MD  Medication  . nitroGLYCERIN (NITROSTAT) SL tablet 0.4 mg     Allergies:   Aspirin   Social History   Socioeconomic History  . Marital status: Single    Spouse name: Not on file  . Number  of children: 1  . Years of education: 86  . Highest education level: Not on file  Occupational History  . Occupation: [employer]  . Financial resource strain: Not on file  . Food insecurity    Worry: Not on file    Inability: Not on file  . Transportation needs    Medical: Not on file    Non-medical: Not on file  Tobacco Use  . Smoking  status: Former Smoker    Packs/day: 0.25    Years: 17.00    Pack years: 4.25    Types: Cigarettes    Quit date: 03/02/2013    Years since quitting: 6.6  . Smokeless tobacco: Never Used  Substance and Sexual Activity  . Alcohol use: No    Alcohol/week: 0.0 standard drinks  . Drug use: Not Currently    Types: Marijuana    Comment: former - 6 yrs ago  . Sexual activity: Not on file  Lifestyle  . Physical activity    Days per week: Not on file    Minutes per session: Not on file  . Stress: Not on file  Relationships  . Social Herbalist on phone: Not on file    Gets together: Not on file    Attends religious service: Not on file    Active member of club or organization: Not on file    Attends meetings of clubs or organizations: Not on file    Relationship status: Not on file  Other Topics Concern  . Not on file  Social History Narrative   She lives w/ her fiance'. She has one son.    Highest level of education:  12th grade, did not graduate. Currently back in school.   Right-handed   Caffeine: 1 cup of coffee some mornings      Social History      Diet? No       Do you drink/eat things with caffeine? yes      Marital status?           Single                          What year were you married?       Do you live in a house, apartment, assisted living, condo, trailer, etc.? house      Is it one or more stories?     How many persons live in your home?  2      Do you have any pets in your home? (please list)   No       Highest level of education completed? Some college       Current or past profession:        Do you exercise?       Yes                                Type & how often? Daily       Advanced Directives      Do you have a living will? No       Do you have a DNR form?                                  If not, do you want to discuss one? No       Do you have signed POA/HPOA for forms?  No       Functional Status      Do you have difficulty bathing or dressing yourself? No       Do you have difficulty preparing food or eating? No       Do you have difficulty managing your medications? No       Do you have difficulty managing your finances? No  Do you have difficulty affording your medications? No         Family History: The patient's family history includes Asthma in her mother; COPD in her father and mother; Diabetes in her father; Heart Problems in her father and mother; Heart attack in her father and mother; Migraines in her mother; Other in her son; Peptic Ulcer in her father. She was adopted.  ROS:   Please see the history of present illness.     All other systems reviewed and are negative.  EKGs/Labs/Other Studies Reviewed:    The following studies were reviewed today:   EKG: September 14, 2019: Normal sinus rhythm 95.  No ST or T wave changes.  Normal EKG.  Recent Labs: 08/22/2019: ALT 10; TSH 1.21 09/14/2019: BUN 13; Creatinine, Ser 1.17; Hemoglobin 13.7; Platelets 252; Potassium 3.3; Sodium 137  Recent Lipid Panel    Component Value Date/Time   CHOL 192 08/22/2019 1540   TRIG 105 08/22/2019 1540   HDL 53 08/22/2019 1540   CHOLHDL 3.6 08/22/2019 1540   VLDL 12.6 06/27/2018 0915   LDLCALC 118 (H) 08/22/2019 1540    Physical Exam:    VS:  BP 126/88   Pulse 83   Ht 6' (1.829 m)   Wt (!) 306 lb 12.8 oz (139.2 kg)   LMP 02/19/2016   SpO2 97%   BMI 41.61 kg/m     Wt Readings from Last 3 Encounters:  10/09/19 (!) 306 lb 12.8 oz (139.2 kg)  10/08/19 (!) 307 lb 12.8 oz (139.6 kg)  09/21/19 (!) 307 lb (139.3 kg)     GEN: obese, middle age  black female,   HEENT: Normal NECK: No JVD; No carotid bruits LYMPHATICS: No lymphadenopathy CARDIAC: RR ,   Very tender along her sternum  RESPIRATORY:  Clear to auscultation without rales, wheezing or rhonchi  ABDOMEN: Soft, non-tender, non-distended MUSCULOSKELETAL:  No edema; No deformity  SKIN: Warm and dry NEUROLOGIC:  Alert and oriented x 3 PSYCHIATRIC:  Normal affect   ASSESSMENT:    1. Chest pain of uncertain etiology    PLAN:    In order of problems listed above:  1. Chest pain :   Sternal tenderness.  Pain increases when she twists and turns her torso.  I suspect that this is a musculoskeletal chest pain.   The pain may be related to the weight of her breasts on her sternum .   The pain is not any diffenent whether or not she wears her bra.   I've strongly advised her to work on a diet . Weight loss program .  Will try Meloxicam 7.5 mg a day for 10 days She will get further prescriptions from her primary MD if this is helpful.   2.  Palpitations.   Has occasional sudden increases in her heart rate.  She is on Adderall which she takes on an as-needed basis and also takes albuterol.  Discussed the fact that both of these medications will tend increase her heart rate which she takes them fairly rarely.  We will place an event monitor for further evaluation of her palpitations.  I will see her in 3 months for follow-up visit.   Medication Adjustments/Labs and Tests Ordered: Current medicines are reviewed at length with the patient today.  Concerns regarding medicines are outlined above.  Orders Placed This Encounter  Procedures  . CARDIAC EVENT MONITOR   Meds ordered this encounter  Medications  . meloxicam (MOBIC) 7.5 MG tablet    Sig:  Take 1 tablet (7.5 mg total) by mouth daily.    Dispense:  10 tablet    Refill:  0    Patient Instructions  Medication Instructions:  Your physician has recommended you make the following change in your medication:  1-Take  Meloxicam 7.5 mg by mouth daily for ten days.  If you need a refill on your cardiac medications before your next appointment, please call your pharmacy.   Lab work:  If you have labs (blood work) drawn today and your tests are completely normal, you will receive your results only by: Marland Kitchen MyChart Message (if you have MyChart) OR . A paper copy in the mail If you have any lab test that is abnormal or we need to change your treatment, we will call you to review the results.  Testing/Procedures: Your physician has recommended that you wear an event monitor. Event monitors are medical devices that record the heart's electrical activity. Doctors most often [place] these monitors to diagnose arrhythmias. Arrhythmias are problems with the speed or rhythm of the heartbeat. The monitor is a small, portable device. You can wear one while you do your normal daily activities. This is usually used to diagnose what is causing palpitations/syncope (passing out).  Follow-Up: At [facility], you and your health needs are our priority.  As part of our continuing mission to provide you with exceptional heart care, we have created designated Provider Care Teams.  These Care Teams include your primary Cardiologist (physician) and Advanced Practice Providers (APPs -  Physician Assistants and Nurse Practitioners) who all work together to provide you with the care you need, when you need it. You will need a follow up appointment in:  3 months. You may see Dr. Acie Fredrickson or one of the following Advanced Practice Providers on your designated Care Team: Richardson Dopp, PA-C [place], [place] . Daune Perch, NP       Signed, Mertie Moores, MD  10/10/2019 5:58 PM    [place]

## 2019-10-08 NOTE — Progress Notes (Signed)
Provider: Marlowe Sax FNP-C  Ngetich, Nelda Bucks, NP  Patient Care Team: Ngetich, Nelda Bucks, NP as PCP - General (Family Medicine)  Extended Emergency Contact Information Primary Emergency Contact: [facility] Address: [address]          [place], [place] [address] Johnnette Litter of [place] Phone: [phone] Mobile Phone: [phone] Relation: Significant other Secondary Emergency Contact: Denzil Magnuson Home Phone: [phone] Mobile Phone: [phone] Relation: Mother  Code Status: Full code  Goals of care: Advanced Directive information Advanced Directives 10/08/2019  Does Patient Have a Medical Advance Directive? No  Would patient like information on creating a medical advance directive? No - Patient declined     Chief Complaint  Patient presents with  . Acute Visit    Vision loss and dizziness, also complains of palpitations    HPI:  Pt is a 43 y.o. female seen today  for an acute visit for evaluation of dizziness and changes in her vision.she states has upcoming appointment with Opthalmology for her vision.she has had dizziness since last Thursday 10/04/2019.Dizziness is described " feels like I'm outside my body" as everything is spinning.she denies any nausea or vomiting.she has chronic migraine and follows up with Neurology.  Depression - Started on Celexa 20 mg tablet daily.states feeling much better on current medication.   Immunization - declines influenza vaccine.        Past Medical History:  Diagnosis Date  . Acute low back pain without sciatica 06/22/2018  . ADHD   . Allergy   . Anemia   . Anxiety   . Arthritis   . 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 Fibromyalgia, doubt sarcoid   . Asthma   . Blood transfusion without reported diagnosis   . Chest wall pain 05/26/2018  . Chronic kidney disease   . Chronic low back pain with sciatica 09/15/2018  . Common migraine with intractable migraine  10/07/2016  . Cough variant asthma vs UACS  10/23/2016   - Spirometry 04/15/2016  Very truncated exp loop effort dep portion only  - Allergy profile 10/22/2016 >  Eos 0.2/  IgE  52 RAST POS grass/trees/ragweed  10/22/2016  After extensive coaching HFA effectiveness =    75% try duelra 100 2bid > improved  . Daytime sleepiness 05/26/2018  . Decreased pedal pulses 10/04/2018  . Depression   . Depression, major, single episode, moderate ([place]) 05/26/2018  . Diabetes ([place])   . Dyspnea 04/15/2016   04/15/2016  Walked RA x 3 laps @ 185 ft each stopped due to  End of study, nl pace, no sob or desat    - Spirometry 04/15/2016  Very truncated exp loop effort dep portion only  - 10/22/2016  Walked RA x 3 laps @ 185 ft each stopped due to  End of study, slow pace, min sob/ no desat - full pfts rec 10/22/2016 >>>      . Edema   . Essential hypertension, benign 11/15/2016  . Fibroids 03/11/2016  . Frequency of urination 09/11/2018  . GERD (gastroesophageal reflux disease)   . Herniated lumbar intervertebral disc   . Hiatal hernia   . Hilar adenopathy   . Hypertension   . Hypokalemia 06/28/2018  . Impaired fasting blood sugar 09/11/2018  . Iron deficiency   . Leg pain 12/07/2018  . Low libido 11/27/2018  . Migraine   . Mild sleep apnea 08/03/2018   HST 06/20/18  AHI  8.1 / snoring  with 02 nadir 80% >  08/03/2018 rec sleep medicine consultation   . Morbid (severe) obesity due to excess calories ([place]) 10/23/2016  . Personal history of sarcoidosis 05/26/2018  . Prolonged capillary refill time   . Right leg swelling 08/18/2018  . Sarcoidosis    personal history of  . Seizures ([place])    history of   . Sleep apnea   . Spondylosis   . Vitamin D deficiency    Past Surgical History:  Procedure Laterality Date  . ABDOMINAL HYSTERECTOMY    . ESOPHAGEAL MANOMETRY N/A 09/05/2019   Procedure: ESOPHAGEAL MANOMETRY (EM);  Surgeon: Lavena Bullion, DO;  Location: [facility];  Service: Gastroenterology;  Laterality: N/A;   . LAPAROSCOPIC OVARIAN CYSTECTOMY Left 03/11/2016   Procedure: LAPAROSCOPIC OVARIAN CYSTECTOMY;  Surgeon: Eldred Manges, MD;  Location: [facility];  Service: Gynecology;  Laterality: Left;  . LAPAROSCOPIC VAGINAL HYSTERECTOMY WITH SALPINGECTOMY Bilateral 03/11/2016   Procedure: LAPAROSCOPIC ASSISTED VAGINAL HYSTERECTOMY WITH SALPINGECTOMY;  Surgeon: Eldred Manges, MD;  Location: [facility];  Service: Gynecology;  Laterality: Bilateral;  . [place] IMPEDANCE STUDY  09/05/2019   Procedure: [place] IMPEDANCE STUDY;  Surgeon: Lavena Bullion, DO;  Location: [facility];  Service: Gastroenterology;;  . TUBAL LIGATION      Allergies  Allergen Reactions  . Aspirin Anaphylaxis and Hives         Outpatient Encounter Medications as of 10/08/2019  Medication Sig  . albuterol (PROVENTIL) (2.5 MG/3ML) 0.083% nebulizer solution Take 2.5 mg by nebulization every 6 (six) hours as needed for wheezing or shortness of breath.  Marland Kitchen albuterol (VENTOLIN HFA) 108 (90 Base) MCG/ACT inhaler Inhale 1-2 puffs into the lungs every 4 (four) hours as needed for wheezing or shortness of breath.  . ALPRAZolam (XANAX) 1 MG tablet Take 0.5-1 tablets (0.5-1 mg total) by mouth at bedtime as needed for anxiety.  Marland Kitchen amphetamine-dextroamphetamine (ADDERALL XR) 30 MG 24 hr capsule Take 1 capsule (30 mg total) by mouth daily as needed (ADHD).  . chlorthalidone (HYGROTON) 25 MG tablet Take 1 tablet (25 mg total) by mouth daily.  . citalopram (CELEXA) 20 MG tablet Take 1 tablet (20 mg total) by mouth daily.  . famotidine (PEPCID) 40 MG tablet Take 1 tablet (40 mg total) by mouth 2 (two) times daily.  . ferrous sulfate 325 (65 FE) MG tablet Take 325 mg by mouth daily with breakfast.  . metFORMIN (GLUCOPHAGE) 500 MG tablet Take 1 tablet by mouth daily.   . mometasone-formoterol (DULERA) 100-5 MCG/ACT AERO Inhale 2 puffs into the lungs 2 (two) times daily.  . nortriptyline (PAMELOR) 10 MG capsule TAKE 1 CAPSULE AT NIGHT FOR ONE WEEK, 2 CAPSULES  AT NIGHT FOR ONE WEEK, THEN 3 CAPSULES AT NIGHT  . spironolactone (ALDACTONE) 25 MG tablet Take 1 tablet (25 mg total) by mouth daily.  . [DISCONTINUED] Baclofen 5 MG TABS Take 5 mg by mouth 2 (two) times daily as needed (muscle spasm).   . [DISCONTINUED] citalopram (CELEXA) 10 MG tablet Take 1 tablet (10 mg total) by mouth daily. Take 10 mg x 4 weeks then increase to 20 mg  . [DISCONTINUED] SUMAtriptan (IMITREX) 100 MG tablet Take 1 tablet (100 mg total) by mouth 2 (two) times daily as needed for migraine. May repeat in 2 hours if headache persists or recurs.   Facility-Administered Encounter Medications as of 10/08/2019  Medication  . nitroGLYCERIN (NITROSTAT) SL tablet 0.4 mg    Review of Systems  Constitutional: Negative for appetite change, chills  and fatigue.  HENT: Negative for congestion, rhinorrhea, sinus pressure, sinus pain, sneezing and sore throat.   Eyes: Positive for visual disturbance. Negative for pain, discharge and redness.       Wears bifocal has upcoming appointment with ophthalmology   Respiratory: Negative for cough, choking, chest tightness, shortness of breath and wheezing.   Cardiovascular: Negative for chest pain, palpitations and leg swelling.  Gastrointestinal:       Hx hiatal hernia has appointment for Endoscopy 10/11/2019.Also has appointment with [facility] to discuss option for hiatal hernia repair.   Neurological: Positive for dizziness. Negative for light-headedness.       Chronic headache sees Neurology   Psychiatric/Behavioral: Negative for sleep disturbance and suicidal ideas. The patient is not nervous/anxious.        Depression symptoms has improved     Immunization History  Administered Date(s) Administered  . Influenza-Unspecified 08/03/2019  . PPD Test 04/20/2017  . Pneumococcal Polysaccharide-23 11/13/2014  . Td 10/18/2015  . Tdap 05/08/2014, 01/15/2016   Pertinent  Health Maintenance Due  Topic Date Due  . PAP SMEAR-Modifier   05/26/2020  . INFLUENZA VACCINE  Completed   Fall Risk  12/17/2016  Falls in the past year? No    Vitals:   10/08/19 1554  BP: 128/76  Pulse: 67  Temp: 98.6 F (37 C)  TempSrc: Oral  SpO2: 97%  Weight: (!) 307 lb 12.8 oz (139.6 kg)  Height: 6' (1.829 m)   Body mass index is 41.75 kg/m. Physical Exam Vitals signs reviewed.  Constitutional:      General: She is not in acute distress.    Appearance: She is obese. She is not ill-appearing.  HENT:     Head: Normocephalic.     Right Ear: Tympanic membrane, ear canal and external ear normal. There is no impacted cerumen.     Left Ear: Tympanic membrane, ear canal and external ear normal. There is no impacted cerumen.     Nose: Nose normal. No congestion or rhinorrhea.     Mouth/Throat:     Mouth: Mucous membranes are moist.     Pharynx: Oropharynx is clear. No oropharyngeal exudate or posterior oropharyngeal erythema.  Eyes:     General: No scleral icterus.       Right eye: No discharge.        Left eye: No discharge.     Extraocular Movements: Extraocular movements intact.     Conjunctiva/sclera: Conjunctivae normal.     Pupils: Pupils are equal, round, and reactive to light.     Comments: Corrective lens in place  Neck:     Musculoskeletal: Normal range of motion. No neck rigidity or muscular tenderness.     Vascular: No carotid bruit.  Cardiovascular:     Rate and Rhythm: Normal rate and regular rhythm.     Pulses: Normal pulses.     Heart sounds: Normal heart sounds. No murmur. No friction rub. No gallop.   Pulmonary:     Effort: Pulmonary effort is normal. No respiratory distress.     Breath sounds: Normal breath sounds. No wheezing, rhonchi or rales.  Chest:     Chest wall: No tenderness.  Abdominal:     General: Bowel sounds are normal. There is no distension.     Palpations: Abdomen is soft. There is no mass.     Tenderness: There is no abdominal tenderness. There is no right CVA tenderness, left CVA  tenderness, guarding or rebound.  Lymphadenopathy:  Cervical: No cervical adenopathy.  Skin:    General: Skin is warm and dry.     Coloration: Skin is not pale.     Findings: No bruising or erythema.  Neurological:     Mental Status: She is alert and oriented to person, place, and time.     Cranial Nerves: No cranial nerve deficit.     Sensory: No sensory deficit.     Motor: No weakness.     Coordination: Coordination normal.     Gait: Gait normal.  Psychiatric:        Mood and Affect: Mood normal.        Behavior: Behavior normal.        Thought Content: Thought content normal.        Judgment: Judgment normal.    Labs reviewed: Recent Labs    08/22/19 1540 09/11/19 0050 09/14/19 1718  NA 138 138 137  K 4.3 3.5 3.3*  CL 104 101 99  CO2 25 26 27   GLUCOSE 120* 113* 134*  BUN 11 10 13   CREATININE 0.96 1.01* 1.17*  CALCIUM 9.0 9.2 9.3   Recent Labs    04/18/19 0154 06/25/19 1109 08/22/19 1540  AST 21 17 13   ALT 17 12 10   ALKPHOS 50 50  --   BILITOT 0.4 0.2* 0.2  PROT 6.8 7.0 6.8  ALBUMIN 3.8 3.8  --    Recent Labs    04/18/19 0154 06/25/19 1109  08/22/19 1540 09/11/19 0050 09/14/19 1718  WBC 5.1 4.4   < > 5.0 5.6 6.5  NEUTROABS 2.5 2.5  --  3,115  --   --   HGB 11.7* 11.5*   < > 11.6* 13.3 13.7  HCT 37.3 36.8   < > 36.1 42.1 42.5  MCV 84.8 88.5   < > 84.5 89.0 87.3  PLT 216 199   < > 240 199 252   < > = values in this interval not displayed.   Lab Results  Component Value Date   TSH 1.21 08/22/2019   Lab Results  Component Value Date   HGBA1C 6.1 (H) 08/22/2019   Lab Results  Component Value Date   CHOL 192 08/22/2019   HDL 53 08/22/2019   LDLCALC 118 (H) 08/22/2019   TRIG 105 08/22/2019   CHOLHDL 3.6 08/22/2019    Significant Diagnostic Results in last 30 days:  Ct Angio Head W Or Wo Contrast  Result Date: 09/15/2019 CLINICAL DATA:  Headache. EXAM: CT ANGIOGRAPHY HEAD AND NECK TECHNIQUE: Multidetector CT imaging of the head and neck  was performed using the standard protocol during bolus administration of intravenous contrast. Multiplanar CT image reconstructions and MIPs were obtained to evaluate the vascular anatomy. Carotid stenosis measurements (when applicable) are obtained utilizing NASCET criteria, using the distal internal carotid diameter as the denominator. CONTRAST:  62mL OMNIPAQUE IOHEXOL 350 MG/ML SOLN COMPARISON:  Head CT 02/13/2017 FINDINGS: CT HEAD FINDINGS Brain: There is no evidence of acute infarct, intracranial hemorrhage, mass, midline shift, or extra-axial fluid collection. The ventricles and sulci are normal. Vascular: Evaluated below. Skull: No fracture or focal osseous lesion. Sinuses: Paranasal sinuses and mastoid air cells are clear. Orbits: Unremarkable. Review of the MIP images confirms the above findings CTA NECK FINDINGS Aortic arch: Normal variant aortic arch branching pattern with common origin of the brachiocephalic and left common carotid arteries. Widely patent arch vessel origins. Right carotid system: Patent and smooth without evidence of stenosis or dissection. Tortuous distal cervical ICA. Left carotid system:  Patent and smooth without evidence of stenosis or dissection. Tortuous distal cervical ICA. Vertebral arteries: Patent, codominant, and smooth without evidence of stenosis or dissection. Skeleton: Mild cervical spondylosis. Other neck: No evidence of cervical lymphadenopathy or mass. Upper chest: Clear lung apices. Review of the MIP images confirms the above findings CTA HEAD FINDINGS Anterior circulation: The internal carotid arteries are patent from skull base to carotid termini without evidence of significant stenosis. ACAs and MCAs are patent without evidence of proximal branch occlusion or significant A1 or M1 stenosis. Diffusely irregular vessel appearance is considered artifactual. No definite aneurysm is identified. Posterior circulation: The intracranial vertebral arteries are widely patent  to the basilar. The basilar artery is widely patent. Patent PICAs are seen bilaterally. SCAs are small and not well evaluated though small infundibular are suspected at both origins. There are right larger than left posterior communicating arteries with a hypoplastic right P1 segment. Both PCAs are patent without evidence of flow limiting proximal stenosis. Venous sinuses: Patent. Anatomic variants: Predominantly fetal origin of the right PCA. Review of the MIP images confirms the above findings IMPRESSION: 1. No evidence of acute intracranial abnormality. Unremarkable CT appearance of the brain. 2. No large vessel occlusion, flow limiting proximal stenosis, or definite aneurysm in the head or neck. Electronically Signed   By: Logan Bores M.D.   On: 09/15/2019 11:16   Dg Chest 2 View  Result Date: 09/14/2019 CLINICAL DATA:  Chest pain EXAM: CHEST - 2 VIEW COMPARISON:  09/10/2019 FINDINGS: The heart size and mediastinal contours are within normal limits. Both lungs are clear. Disc degenerative disease of the thoracic spine. IMPRESSION: No acute abnormality of the lungs. Electronically Signed   By: Eddie Candle M.D.   On: 09/14/2019 17:48   Dg Chest 2 View  Result Date: 09/10/2019 CLINICAL DATA:  Swelling in the right neck. Shortness of breath started today. EXAM: CHEST - 2 VIEW COMPARISON:  07/03/2019 FINDINGS: The heart size and mediastinal contours are within normal limits. Both lungs are clear. The visualized skeletal structures are unremarkable. IMPRESSION: No active cardiopulmonary disease. Electronically Signed   By: Van Clines M.D.   On: 09/10/2019 18:40   Ct Angio Neck W And/or Wo Contrast  Result Date: 09/15/2019 CLINICAL DATA:  Headache. EXAM: CT ANGIOGRAPHY HEAD AND NECK TECHNIQUE: Multidetector CT imaging of the head and neck was performed using the standard protocol during bolus administration of intravenous contrast. Multiplanar CT image reconstructions and MIPs were obtained to  evaluate the vascular anatomy. Carotid stenosis measurements (when applicable) are obtained utilizing NASCET criteria, using the distal internal carotid diameter as the denominator. CONTRAST:  59mL OMNIPAQUE IOHEXOL 350 MG/ML SOLN COMPARISON:  Head CT 02/13/2017 FINDINGS: CT HEAD FINDINGS Brain: There is no evidence of acute infarct, intracranial hemorrhage, mass, midline shift, or extra-axial fluid collection. The ventricles and sulci are normal. Vascular: Evaluated below. Skull: No fracture or focal osseous lesion. Sinuses: Paranasal sinuses and mastoid air cells are clear. Orbits: Unremarkable. Review of the MIP images confirms the above findings CTA NECK FINDINGS Aortic arch: Normal variant aortic arch branching pattern with common origin of the brachiocephalic and left common carotid arteries. Widely patent arch vessel origins. Right carotid system: Patent and smooth without evidence of stenosis or dissection. Tortuous distal cervical ICA. Left carotid system: Patent and smooth without evidence of stenosis or dissection. Tortuous distal cervical ICA. Vertebral arteries: Patent, codominant, and smooth without evidence of stenosis or dissection. Skeleton: Mild cervical spondylosis. Other neck: No evidence of cervical lymphadenopathy  or mass. Upper chest: Clear lung apices. Review of the MIP images confirms the above findings CTA HEAD FINDINGS Anterior circulation: The internal carotid arteries are patent from skull base to carotid termini without evidence of significant stenosis. ACAs and MCAs are patent without evidence of proximal branch occlusion or significant A1 or M1 stenosis. Diffusely irregular vessel appearance is considered artifactual. No definite aneurysm is identified. Posterior circulation: The intracranial vertebral arteries are widely patent to the basilar. The basilar artery is widely patent. Patent PICAs are seen bilaterally. SCAs are small and not well evaluated though small infundibular are  suspected at both origins. There are right larger than left posterior communicating arteries with a hypoplastic right P1 segment. Both PCAs are patent without evidence of flow limiting proximal stenosis. Venous sinuses: Patent. Anatomic variants: Predominantly fetal origin of the right PCA. Review of the MIP images confirms the above findings IMPRESSION: 1. No evidence of acute intracranial abnormality. Unremarkable CT appearance of the brain. 2. No large vessel occlusion, flow limiting proximal stenosis, or definite aneurysm in the head or neck. Electronically Signed   By: Logan Bores M.D.   On: 09/15/2019 11:16    Assessment/Plan 1. Dizziness Vital signs stable.Neuro exam intact.Possible related to her vision changes or vestibular related.follow up with Ophthalmology for vision changes. Encouraged fluid intake. - meclizine (ANTIVERT) 25 MG tablet; Take 1 tablet (25 mg total) by mouth 3 (three) times daily as needed for dizziness.  Dispense: 30 tablet; Refill: 0 - Education information on dizziness provided on after visit summary this visit.   2. Current moderate episode of major depressive disorder, unspecified whether recurrent ([place]) Symptoms has improved compared to previous visit.will continue on current Citalopram 20 mg tablet daily. - Refilled citalopram script send to pharmacy.    Family/ staff Communication: Reviewed plan of care with patient.   Labs/tests ordered: None   Dinah C Ngetich, NP

## 2019-10-08 NOTE — Telephone Encounter (Signed)
RX request came over for citalopram 10 mg, patient to take 10 mg x 4 weeks then increase to 20 mg. 20 mg RX should be on file, Dinah sent RX's for 10 and 20 mg on 09/21/2019

## 2019-10-09 ENCOUNTER — Ambulatory Visit (INDEPENDENT_AMBULATORY_CARE_PROVIDER_SITE_OTHER): Payer: PRIVATE HEALTH INSURANCE | Admitting: Cardiovascular Disease

## 2019-10-09 ENCOUNTER — Encounter: Payer: Self-pay | Admitting: Cardiovascular Disease

## 2019-10-09 VITALS — BP 126/88 | HR 83 | Ht 72.0 in | Wt 306.8 lb

## 2019-10-09 DIAGNOSIS — R079 Chest pain, unspecified: Secondary | ICD-10-CM

## 2019-10-09 MED ORDER — MELOXICAM 7.5 MG PO TABS: 7.5000 mg | ORAL_TABLET | Freq: Every day | ORAL | 0 refills | Status: DC

## 2019-10-09 NOTE — Patient Instructions (Signed)
Medication Instructions:  Your physician has recommended you make the following change in your medication:  1-Take Meloxicam 7.5 mg by mouth daily for ten days.  If you need a refill on your cardiac medications before your next appointment, please call your pharmacy.   Lab work:  If you have labs (blood work) drawn today and your tests are completely normal, you will receive your results only by: Marland Kitchen MyChart Message (if you have MyChart) OR . A paper copy in the mail If you have any lab test that is abnormal or we need to change your treatment, we will call you to review the results.  Testing/Procedures: Your physician has recommended that you wear an event monitor. Event monitors are medical devices that record the heart's electrical activity. Doctors most often [place] these monitors to diagnose arrhythmias. Arrhythmias are problems with the speed or rhythm of the heartbeat. The monitor is a small, portable device. You can wear one while you do your normal daily activities. This is usually used to diagnose what is causing palpitations/syncope (passing out).  Follow-Up: At [facility], you and your health needs are our priority.  As part of our continuing mission to provide you with exceptional heart care, we have created designated Provider Care Teams.  These Care Teams include your primary Cardiologist (physician) and Advanced Practice Providers (APPs -  Physician Assistants and Nurse Practitioners) who all work together to provide you with the care you need, when you need it. You will need a follow up appointment in:  3 months. You may see Dr. Acie Fredrickson or one of the following Advanced Practice Providers on your designated Care Team: Richardson Dopp, PA-C [place], [place] . Daune Perch, NP

## 2019-10-10 ENCOUNTER — Telehealth: Payer: Self-pay

## 2019-10-10 LAB — SARS CORONAVIRUS 2 (TAT 6-24 HRS): SARS Coronavirus 2: NEGATIVE

## 2019-10-10 NOTE — Telephone Encounter (Signed)
Covid-19 screening questions   Do you now or have you had a fever in the last 14 days? NO   Do you have any respiratory symptoms of shortness of breath or cough now or in the last 14 days? NO  Do you have any family members or close contacts with diagnosed or suspected Covid-19 in the past 14 days? NO  Have you been tested for Covid-19 and found to be positive? NO        

## 2019-10-11 ENCOUNTER — Ambulatory Visit (AMBULATORY_SURGERY_CENTER): Payer: PRIVATE HEALTH INSURANCE | Admitting: Gastroenterology

## 2019-10-11 ENCOUNTER — Encounter: Payer: Self-pay | Admitting: Gastroenterology

## 2019-10-11 ENCOUNTER — Other Ambulatory Visit: Payer: Self-pay | Admitting: Gastroenterology

## 2019-10-11 ENCOUNTER — Other Ambulatory Visit: Payer: Self-pay

## 2019-10-11 VITALS — BP 132/73 | HR 67 | Temp 98.7°F | Resp 13 | Ht 72.0 in | Wt 307.0 lb

## 2019-10-11 DIAGNOSIS — K2970 Gastritis, unspecified, without bleeding: Secondary | ICD-10-CM | POA: Diagnosis not present

## 2019-10-11 DIAGNOSIS — K219 Gastro-esophageal reflux disease without esophagitis: Secondary | ICD-10-CM

## 2019-10-11 DIAGNOSIS — R1310 Dysphagia, unspecified: Secondary | ICD-10-CM

## 2019-10-11 DIAGNOSIS — B9681 Helicobacter pylori [H. pylori] as the cause of diseases classified elsewhere: Secondary | ICD-10-CM | POA: Diagnosis not present

## 2019-10-11 DIAGNOSIS — R1319 Other dysphagia: Secondary | ICD-10-CM

## 2019-10-11 HISTORY — PX: UPPER GASTROINTESTINAL ENDOSCOPY: SHX188

## 2019-10-11 MED ORDER — SODIUM CHLORIDE 0.9 % IV SOLN: 500.0000 mL | Freq: Once | INTRAVENOUS | Status: DC

## 2019-10-11 NOTE — Progress Notes (Signed)
Report to PACU, RN, vss, BBS= Clear.  

## 2019-10-11 NOTE — Progress Notes (Signed)
Temps per JB Vitals per CW

## 2019-10-11 NOTE — Op Note (Signed)
[place] Patient Name: Victoria Moore Procedure Date: 10/11/2019 12:49 PM MRN: [id number] Endoscopist: Gerrit Heck , MD Age: 43 Referring MD:  Date of Birth: [birth year] Gender: Female Account #: [id number] Procedure:                Upper GI endoscopy Indications:              Dysphagia, Esophageal reflux, Preoperative                            assessment, History of iron deficiency anemia,                            History of Schatzki ring Medicines:                Monitored Anesthesia Care Procedure:                Pre-Anesthesia Assessment:                           - Prior to the procedure, a History and Physical                            was performed, and patient medications and                            allergies were reviewed. The patient's tolerance of                            previous anesthesia was also reviewed. The risks                            and benefits of the procedure and the sedation                            options and risks were discussed with the patient.                            All questions were answered, and informed consent                            was obtained. Prior Anticoagulants: The patient has                            taken no previous anticoagulant or antiplatelet                            agents. ASA Grade Assessment: III - A patient with                            severe systemic disease. After reviewing the risks                            and benefits, the patient was deemed in  satisfactory condition to undergo the procedure.                           After obtaining informed consent, the endoscope was                            passed under direct vision. Throughout the                            procedure, the patient's blood pressure, pulse, and                            oxygen saturations were monitored continuously. The                            Endoscope was introduced through  the mouth, and                            advanced to the second part of duodenum. The upper                            GI endoscopy was accomplished without difficulty.                            The patient tolerated the procedure well. Scope In: Scope Out: Findings:                 The examined esophagus was normal. The scope was                            withdrawn. Dilation was performed with a Maloney                            dilator with no resistance at 58 Fr. The dilation                            site was examined following endoscope reinsertion                            and showed no bleeding, mucosal tear or                            perforation. Estimated blood loss: none.                           The Z-line was regular and was found 40 cm from the                            incisors.                           Localized mild inflammation characterized by                            erythema was found  in the gastric fundus and in the                            gastric body. Biopsies were taken with a cold                            forceps for Helicobacter pylori testing. Estimated                            blood loss was minimal.                           The incisura, gastric antrum and pylorus were                            normal.                           The duodenal bulb, first portion of the duodenum                            and second portion of the duodenum were normal.                            Biopsies for histology were taken with a cold                            forceps for evaluation of celiac disease. Estimated                            blood loss was minimal. Complications:            No immediate complications. Estimated Blood Loss:     Estimated blood loss was minimal. Impression:               - Normal esophagus. Dilated with 54 Fr Maloney                            dilator.                           - Z-line regular, 40 cm from the  incisors.                           - Gastritis. Biopsied.                           - Normal incisura, antrum and pylorus.                           - Normal duodenal bulb, first portion of the                            duodenum and second portion of the duodenum.  Biopsied. Recommendation:           - Patient has a contact number available for                            emergencies. The signs and symptoms of potential                            delayed complications were discussed with the                            patient. Return to normal activities tomorrow.                            Written discharge instructions were provided to the                            patient.                           - Resume previous diet.                           - Continue present medications.                           - Await pathology results.                           - Return to GI clinic at appointment to be                            scheduled following appointment with Dr. Redmond Pulling at                            [facility]. Gerrit Heck, MD 10/11/2019 1:34:45 PM

## 2019-10-11 NOTE — Patient Instructions (Signed)
Please read handouts provided. Continue present medications. Await pathology results. Return to GI clinic at appointment to be scheduled following appointment with Dr. Redmond Pulling at [facility].       YOU HAD AN ENDOSCOPIC PROCEDURE TODAY AT [facility]:   Refer to the procedure report that was given to you for any specific questions about what was found during the examination.  If the procedure report does not answer your questions, please call your gastroenterologist to clarify.  If you requested that your care partner not be given the details of your procedure findings, then the procedure report has been included in a sealed envelope for you to review at your convenience later.  YOU SHOULD EXPECT: Some feelings of bloating in the abdomen. Passage of more gas than usual.  Walking can help get rid of the air that was put into your GI tract during the procedure and reduce the bloating. If you had a lower endoscopy (such as a colonoscopy or flexible sigmoidoscopy) you may notice spotting of blood in your stool or on the toilet paper. If you underwent a bowel prep for your procedure, you may not have a normal bowel movement for a few days.  Please Note:  You might notice some irritation and congestion in your nose or some drainage.  This is from the oxygen used during your procedure.  There is no need for concern and it should clear up in a day or so.  SYMPTOMS TO REPORT IMMEDIATELY:     Following upper endoscopy (EGD)  Vomiting of blood or coffee ground material  New chest pain or pain under the shoulder blades  Painful or persistently difficult swallowing  New shortness of breath  Fever of 100F or higher  Black, tarry-looking stools  For urgent or emergent issues, a gastroenterologist can be reached at any hour by calling [phone].   DIET:  We do recommend a small meal at first, but then you may proceed to your regular diet.  Drink plenty of fluids but  you should avoid alcoholic beverages for 24 hours.  ACTIVITY:  You should plan to take it easy for the rest of today and you should NOT DRIVE or use heavy machinery until tomorrow (because of the sedation medicines used during the test).    FOLLOW UP: Our staff will call the number listed on your records 48-72 hours following your procedure to check on you and address any questions or concerns that you may have regarding the information given to you following your procedure. If we do not reach you, we will leave a message.  We will attempt to reach you two times.  During this call, we will ask if you have developed any symptoms of COVID 19. If you develop any symptoms (ie: fever, flu-like symptoms, shortness of breath, cough etc.) before then, please call [phone].  If you test positive for Covid 19 in the 2 weeks post procedure, please call and report this information to [place].    If any biopsies were taken you will be contacted by phone or by letter within the next 1-3 weeks.  Please call us at [phone] if you have not heard about the biopsies in 3 weeks.    SIGNATURES/CONFIDENTIALITY: You and/or your care partner have signed paperwork which will be entered into your electronic medical record.  These signatures attest to the fact that that the information above on your After Visit Summary has been reviewed and is understood.  Full responsibility  of the confidentiality of this discharge information lies with you and/or your care-partner.

## 2019-10-11 NOTE — Progress Notes (Signed)
Called to room to assist during endoscopic procedure.  Patient ID and intended procedure confirmed with present staff. Received instructions for my participation in the procedure from the performing physician.  

## 2019-10-15 ENCOUNTER — Telehealth: Payer: Self-pay | Admitting: *Deleted

## 2019-10-15 ENCOUNTER — Telehealth: Payer: Self-pay | Admitting: Neurology

## 2019-10-15 ENCOUNTER — Telehealth: Payer: Self-pay

## 2019-10-15 DIAGNOSIS — G4489 Other headache syndrome: Secondary | ICD-10-CM

## 2019-10-15 MED ORDER — LEVETIRACETAM 500 MG PO TABS: ORAL_TABLET | ORAL | 5 refills | Status: DC

## 2019-10-15 NOTE — Telephone Encounter (Signed)
I reached out to the pt she reports on Saturday during the evening she was asleep and her husband witnessed what he thought may have been a seizure. I asked the pt what her husband said happened during this episode but she states he did not say, just that he thought she had a seizure. Pt reports since Saturday she has been tired and h/a as been present but no other events have been noted. Pt was previously on Dilantin and Topamax and wanted to know what MD would advise. Pt's best call back # is [phone].

## 2019-10-15 NOTE — Telephone Encounter (Signed)
  Follow up Call-  Call back number 10/11/2019 08/28/2019 08/20/2019 09/26/2018  Post procedure Call Back phone  # [phone]  Permission to leave phone message Yes Yes Yes Yes  Some recent data might be hidden     Patient questions:  Do you have a fever, pain , or abdominal swelling? No. Pain Score  0 *  Have you tolerated food without any problems? Yes.    Have you been able to return to your normal activities? Yes.    Do you have any questions about your discharge instructions: Diet   No. Medications  No. Follow up visit  No.  Do you have questions or concerns about your Care? No.  Actions: * If pain score is 4 or above: No action needed, pain <4.  1. Have you developed a fever since your procedure? no  2.   Have you had an respiratory symptoms (SOB or cough) since your procedure? no  3.   Have you tested positive for COVID 19 since your procedure no  4.   Have you had any family members/close contacts diagnosed with the COVID 19 since your procedure?  no   If yes to any of these questions please route to Joylene John, RN and Alphonsa Gin, Therapist, sports.

## 2019-10-15 NOTE — Telephone Encounter (Signed)
Pt called wanting to know if she can get some seizure medications prescribed to her due to her having one the other day. Please advise.

## 2019-10-15 NOTE — Telephone Encounter (Signed)
Spoke to pt, went over monitor instructions. Verified address. 30 day [employer] Event monitor ordered to be mailed to pt's home address.

## 2019-10-15 NOTE — Telephone Encounter (Signed)
I called the patient.  The patient had a nocturnal seizure 2 days ago, witnessed by her husband.  I will get her on Keppra, working up to 500 mg twice daily.  The patient never had her MRI of the brain done, but she did have a CT of the head done with a CT angiogram which was unremarkable, I will go ahead and get a lumbar puncture set up as she continues to have headaches with some intermittent episodes of visual loss, need to rule out pseudotumor cerebri.

## 2019-10-18 ENCOUNTER — Telehealth: Payer: Self-pay | Admitting: Neurology

## 2019-10-18 ENCOUNTER — Ambulatory Visit
Admission: RE | Admit: 2019-10-18 | Discharge: 2019-10-18 | Disposition: A | Discharge status: Home / Self Care | Payer: Self-pay | Source: Ambulatory Visit | Attending: Neurology | Admitting: Neurology

## 2019-10-18 ENCOUNTER — Other Ambulatory Visit: Payer: Self-pay

## 2019-10-18 ENCOUNTER — Telehealth: Payer: Self-pay | Admitting: Gastroenterology

## 2019-10-18 VITALS — BP 163/81 | HR 67

## 2019-10-18 DIAGNOSIS — A048 Other specified bacterial intestinal infections: Secondary | ICD-10-CM

## 2019-10-18 DIAGNOSIS — G4489 Other headache syndrome: Secondary | ICD-10-CM

## 2019-10-18 DIAGNOSIS — K2990 Gastroduodenitis, unspecified, without bleeding: Secondary | ICD-10-CM

## 2019-10-18 DIAGNOSIS — K2970 Gastritis, unspecified, without bleeding: Secondary | ICD-10-CM

## 2019-10-18 LAB — CSF CELL COUNT WITH DIFFERENTIAL
RBC Count, CSF: 10 cells/uL (ref 0–10)
WBC, CSF: 2 cells/uL (ref 0–5)

## 2019-10-18 LAB — GLUCOSE, CSF: Glucose, CSF: 72 mg/dL (ref 40–80)

## 2019-10-18 LAB — PROTEIN, CSF: Total Protein, CSF: 33 mg/dL (ref 15–45)

## 2019-10-18 MED ORDER — METRONIDAZOLE 250 MG PO TABS: 250.0000 mg | ORAL_TABLET | Freq: Four times a day (QID) | ORAL | 0 refills | Status: AC

## 2019-10-18 MED ORDER — ACETAZOLAMIDE 125 MG PO TABS: ORAL_TABLET | ORAL | 2 refills | Status: DC

## 2019-10-18 MED ORDER — DOXYCYCLINE MONOHYDRATE 100 MG PO TABS: 100.0000 mg | ORAL_TABLET | Freq: Two times a day (BID) | ORAL | 0 refills | Status: AC

## 2019-10-18 MED ORDER — OMEPRAZOLE 20 MG PO CPDR: 20.0000 mg | DELAYED_RELEASE_CAPSULE | Freq: Two times a day (BID) | ORAL | 0 refills | Status: DC

## 2019-10-18 NOTE — Telephone Encounter (Signed)
Spoke with patient concerning results-please review alternative documentation

## 2019-10-18 NOTE — Discharge Instructions (Signed)

## 2019-10-18 NOTE — Telephone Encounter (Signed)
I called the patient.  The spinal tap was done, opening pressure was elevated at 27 cm.  The patient likely does have pseudotumor cerebri.  I will send a prescription for Diamox, she will need to wait 2 days before she starts the medication as she just had a spinal tap.

## 2019-10-19 ENCOUNTER — Ambulatory Visit: Payer: PRIVATE HEALTH INSURANCE | Admitting: Gastroenterology

## 2019-10-23 ENCOUNTER — Encounter: Payer: Self-pay | Admitting: Cardiovascular Disease

## 2019-10-23 ENCOUNTER — Ambulatory Visit (INDEPENDENT_AMBULATORY_CARE_PROVIDER_SITE_OTHER): Payer: PRIVATE HEALTH INSURANCE

## 2019-10-23 DIAGNOSIS — R002 Palpitations: Secondary | ICD-10-CM | POA: Diagnosis not present

## 2019-10-23 DIAGNOSIS — R079 Chest pain, unspecified: Secondary | ICD-10-CM

## 2019-10-24 ENCOUNTER — Telehealth: Payer: Self-pay

## 2019-10-24 NOTE — Telephone Encounter (Signed)
Patient called the office requesting additional samples of Dexilant. #20 provided. Pt to pick up at the front desk.   Medication Samples have been provided to the patient.  Drug name: Dexilant       Strength: 60mg         Qty: 20  LOT: [id number]  Exp.Date: 06-2021  Dosing instructions: one po daily  The patient has been instructed regarding the correct time, dose, and frequency of taking this medication, including desired effects and most common side effects.

## 2019-10-25 MED ORDER — SODIUM CHLORIDE (PF) 0.9 % IJ SOLN
INTRAMUSCULAR | Status: AC
  Filled 2019-10-25: qty 50

## 2019-10-31 ENCOUNTER — Other Ambulatory Visit: Payer: Self-pay

## 2019-10-31 ENCOUNTER — Ambulatory Visit (INDEPENDENT_AMBULATORY_CARE_PROVIDER_SITE_OTHER): Payer: Self-pay | Admitting: Orthopaedic Surgery

## 2019-10-31 ENCOUNTER — Encounter: Payer: Self-pay | Admitting: Orthopaedic Surgery

## 2019-10-31 VITALS — BP 125/83 | HR 77 | Ht 72.0 in | Wt 302.0 lb

## 2019-10-31 DIAGNOSIS — M5136 Other intervertebral disc degeneration, lumbar region: Secondary | ICD-10-CM

## 2019-10-31 NOTE — Telephone Encounter (Signed)
Message routed to Ngetich, Dinah C, NP  

## 2019-11-01 NOTE — Progress Notes (Signed)
Office Visit Note   Patient: Victoria Moore           Date of Birth: [birth year]           MRN: [id number] Visit Date: 10/31/2019              Requested by: Sandrea Hughs, NP [address] [place],  [place] [address] PCP: Sandrea Hughs, NP   Assessment & Plan: Visit Diagnoses:  1. Other intervertebral disc degeneration, lumbar region     Plan: Patient has foraminal narrowing bilateral L5-S1 with disc degeneration.  This was by MRI scan several years ago.  Her symptoms have progressed.  She has been through therapy epidural injections in the past anti-inflammatory medications and also some narcotic medication.  I recommend proceeding with repeat MRI scan.  Office follow-up after scan.  Follow-Up Instructions: No follow-ups on file.   Orders:  Orders Placed This Encounter  Procedures   MR Lumbar Spine w/o contrast   No orders of the defined types were placed in this encounter.     Procedures: No procedures performed   Clinical Data: No additional findings.   Subjective: Chief Complaint  Patient presents with   Lower Back - Pain, Follow-up    HPI 43 year old female first-time visit here with complaints of chronic low back pain.  She has been on different pain medication in the past small amounts usually with emergency room hydrocodone, Tylenol 3, oxycodone gave little relief she had previous epidural injection several years ago.  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  She has had chronic back pain at the lumbosacral junction and states she has equal right and left leg pain.  She still able to ambulate stairs.  She has increased BMI at 40.  Also problems with depression being treated.  Review of Systems possible depression GERD dyspnea, mild sleep apnea, morbid obesity.  Patient relates history of sarcoidosis, seizures.   Objective: Vital Signs: BP 125/83    Pulse 77    Ht 6' (1.829 m)    Wt (!) 302 lb (137 kg)    LMP  02/19/2016    BMI 40.96 kg/m   Physical Exam Constitutional:      Appearance: She is well-developed.  HENT:     Head: Normocephalic.     Right Ear: External ear normal.     Left Ear: External ear normal.  Eyes:     Pupils: Pupils are equal, round, and reactive to light.  Neck:     Thyroid: No thyromegaly.     Trachea: No tracheal deviation.  Cardiovascular:     Rate and Rhythm: Normal rate.  Pulmonary:     Effort: Pulmonary effort is normal.  Abdominal:     Palpations: Abdomen is soft.  Skin:    General: Skin is warm and dry.  Neurological:     Mental Status: She is alert and oriented to person, place, and time.  Psychiatric:        Behavior: Behavior normal.     Ortho Exam patient is able to heel and toe walk she has tenderness palpation of the lumbar spine.  Some sciatic notch tenderness tenderness over both trochanters.  Some pain with straight leg raising 90 degrees.  Anterior tib quad EHL is normal.  Negative logroll the hips knees reach full extension no quad atrophy.  Distal pulses are 2+ and symmetrical.  She has increased pain with forward flexion with fingertips to  mid tibia region.  Specialty Comments:  No specialty comments available.  Imaging: CLINICAL DATA:  Sciatica right side  EXAM: MRI LUMBAR SPINE WITHOUT CONTRAST  TECHNIQUE: Multiplanar, multisequence MR imaging of the lumbar spine was performed. No intravenous contrast was administered.  COMPARISON:  CT lumbar 10/09/2016  FINDINGS: Segmentation:  Normal  Alignment:  Normal  Vertebrae: Negative for fracture or mass. 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  Conus medullaris: Extends to the L1-2 level and appears normal.  Paraspinal and other soft tissues: Paraspinous muscles normal. Retroperitoneal structures normal  Disc levels:  L1-2:  Negative  L2-3:  Negative  L3-4:  Negative  L4-5:  Mild facet degeneration.  Negative for spinal stenosis  L5-S1:  Mild disc degeneration and disc bulging. Mild facet degeneration. Moderate foraminal by lack bilaterally which could cause L5 nerve root impingement.  IMPRESSION: Disc and facet degeneration at L5-S1 causing foraminal narrowing bilaterally. Possible L5 nerve root impingement bilaterally.   Electronically Signed   By: Franchot Gallo M.D.   On: 11/07/2016 18:09   PMFS History: Patient Active Problem List   Diagnosis Date Noted   Other intervertebral disc degeneration, lumbar region 09/19/2019   Complex cyst of left ovary 02/01/2019   Sarcoidosis 02/01/2019   Leg pain 12/07/2018   Low libido 11/27/2018   Prolonged capillary refill time 10/04/2018   Decreased pedal pulses 10/04/2018   Spondylosis 09/15/2018   Chronic low back pain with sciatica 09/15/2018   Impaired fasting blood sugar 09/11/2018   Weight gain 09/11/2018   Mild sleep apnea 08/03/2018   Hypokalemia 06/28/2018   Medication side effect 06/22/2018   Acute low back pain without sciatica 06/22/2018   Personal history of sarcoidosis 05/26/2018   Vitamin D deficiency 05/26/2018   Encounter for health maintenance examination with abnormal findings 05/26/2018   Depression, major, single episode, moderate ([place]) 05/26/2018   Daytime sleepiness 05/26/2018   Chest pain of uncertain etiology [id number]   GERD (gastroesophageal reflux disease) 05/26/2018   Essential hypertension, benign 11/15/2016   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/10/2016   Morbid (severe) obesity due to excess calories ([place]) 10/23/2016   Common migraine with intractable migraine 10/07/2016   Dyspnea 04/15/2016   Hilar adenopathy 04/15/2016   Seizures ([place]) 03/11/2016   Anemia 11/12/2014   Iron deficiency 11/12/2014   Past Medical History:  Diagnosis Date   Acute low back pain without sciatica 06/22/2018   ADHD    Allergy    Anemia    Anxiety    Arthritis    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/10/2016   Refer to  Rheumatology see Nov 25 2016 [place] :  ? Fibromyalgia, doubt sarcoid    Asthma    Blood transfusion without reported diagnosis    Chest wall pain 05/26/2018   Chronic kidney disease    Chronic low back pain with sciatica 09/15/2018   Common migraine with intractable migraine 10/07/2016   Cough variant asthma vs UACS  10/23/2016   - Spirometry 04/15/2016  Very truncated exp loop effort dep portion only  - Allergy profile 10/22/2016 >  Eos 0.2/  IgE  52 RAST POS grass/trees/ragweed  10/22/2016  After extensive coaching HFA effectiveness =    75% try duelra 100 2bid > improved   Daytime sleepiness 05/26/2018   Decreased pedal pulses 10/04/2018   Depression    Depression, major, single episode, moderate ([place]) 05/26/2018   Diabetes ([place])    Dyspnea 04/15/2016   04/15/2016  Walked RA x 3 laps @ 185  ft each stopped due to  End of study, nl pace, no sob or desat    - Spirometry 04/15/2016  Very truncated exp loop effort dep portion only  - 10/22/2016  Walked RA x 3 laps @ 185 ft each stopped due to  End of study, slow pace, min sob/ no desat - full pfts rec 10/22/2016 >>>       Edema    Essential hypertension, benign 11/15/2016   Fibroids 03/11/2016   Frequency of urination 09/11/2018   GERD (gastroesophageal reflux disease)    Herniated lumbar intervertebral disc    Hiatal hernia    Hilar adenopathy    Hypertension    Hypokalemia 06/28/2018   Impaired fasting blood sugar 09/11/2018   Iron deficiency    Leg pain 12/07/2018   Low libido 11/27/2018   Migraine    Mild sleep apnea 08/03/2018   HST 06/20/18  AHI  8.1 / snoring with 02 nadir 80% >  08/03/2018 rec sleep medicine consultation    Morbid (severe) obesity due to excess calories ([place]) 10/23/2016   Personal history of sarcoidosis 05/26/2018   Prolonged capillary refill time    Right leg swelling 08/18/2018   Sarcoidosis    personal history of   Seizures ([place])    history of    Sleep apnea    Spondylosis     Vitamin D deficiency     Family History  Adopted: Yes  Problem Relation Age of Onset   Other Son        Growing pains   Asthma Mother    Heart Problems Mother    Migraines Mother    COPD Mother    Heart attack Mother    Diabetes Father    Peptic Ulcer Father    Heart Problems Father    COPD Father    Heart attack Father    Colon cancer Neg Hx    Colon polyps Neg Hx    Esophageal cancer Neg Hx    Rectal cancer Neg Hx    Stomach cancer Neg Hx     Past Surgical History:  Procedure Laterality Date   ABDOMINAL HYSTERECTOMY     ESOPHAGEAL MANOMETRY N/A 09/05/2019   Procedure: ESOPHAGEAL MANOMETRY (EM);  Surgeon: Lavena Bullion, DO;  Location: [facility];  Service: Gastroenterology;  Laterality: N/A;   LAPAROSCOPIC OVARIAN CYSTECTOMY Left 03/11/2016   Procedure: LAPAROSCOPIC OVARIAN CYSTECTOMY;  Surgeon: Eldred Manges, MD;  Location: [facility];  Service: Gynecology;  Laterality: Left;   LAPAROSCOPIC VAGINAL HYSTERECTOMY WITH SALPINGECTOMY Bilateral 03/11/2016   Procedure: LAPAROSCOPIC ASSISTED VAGINAL HYSTERECTOMY WITH SALPINGECTOMY;  Surgeon: Eldred Manges, MD;  Location: [facility];  Service: Gynecology;  Laterality: Bilateral;   [place] IMPEDANCE STUDY  09/05/2019   Procedure: [place] IMPEDANCE STUDY;  Surgeon: Lavena Bullion, DO;  Location: [facility];  Service: Gastroenterology;;   TUBAL LIGATION     UPPER GASTROINTESTINAL ENDOSCOPY  10/11/2019   06/2019   Social History   Occupational History   Occupation: [employer]  Tobacco Use   Smoking status: Former Smoker    Packs/day: 0.25    Years: 17.00    Pack years: 4.25    Types: Cigarettes    Quit date: 03/02/2013    Years since quitting: 6.6   Smokeless tobacco: Never Used  Substance and Sexual Activity   Alcohol use: No    Alcohol/week: 0.0 standard drinks   Drug use: Not Currently    Types: Marijuana    Comment: former -  6 yrs ago   Sexual activity: Not on file

## 2019-11-02 ENCOUNTER — Encounter: Payer: Self-pay | Admitting: Neurology

## 2019-11-02 ENCOUNTER — Other Ambulatory Visit: Payer: Self-pay

## 2019-11-02 ENCOUNTER — Encounter: Payer: Self-pay | Admitting: Orthopaedic Surgery

## 2019-11-02 MED ORDER — AMPHETAMINE-DEXTROAMPHET ER 30 MG PO CP24: 30.0000 mg | ORAL_CAPSULE | Freq: Every day | ORAL | 0 refills | Status: DC | PRN

## 2019-11-02 NOTE — Telephone Encounter (Signed)
Patient requesting refill. Verified in [place] last refill was 08/23/2019 for 30 tabs. Last appointment was 10/08/2019 and next appointment was 02/11/2020. Routing to provider for review.

## 2019-11-03 ENCOUNTER — Other Ambulatory Visit: Payer: Self-pay | Admitting: Family

## 2019-11-05 ENCOUNTER — Other Ambulatory Visit: Payer: Self-pay

## 2019-11-05 ENCOUNTER — Telehealth (INDEPENDENT_AMBULATORY_CARE_PROVIDER_SITE_OTHER): Payer: PRIVATE HEALTH INSURANCE | Admitting: Gastroenterology

## 2019-11-05 ENCOUNTER — Encounter: Payer: Self-pay | Admitting: Gastroenterology

## 2019-11-05 VITALS — Ht 72.0 in | Wt 302.0 lb

## 2019-11-05 DIAGNOSIS — K449 Diaphragmatic hernia without obstruction or gangrene: Secondary | ICD-10-CM | POA: Diagnosis not present

## 2019-11-05 DIAGNOSIS — K594 Anal spasm: Secondary | ICD-10-CM

## 2019-11-05 DIAGNOSIS — K219 Gastro-esophageal reflux disease without esophagitis: Secondary | ICD-10-CM

## 2019-11-05 MED ORDER — HYOSCYAMINE SULFATE 0.125 MG SL SUBL: 0.1250 mg | SUBLINGUAL_TABLET | Freq: Four times a day (QID) | SUBLINGUAL | 1 refills | Status: DC | PRN

## 2019-11-05 NOTE — Progress Notes (Signed)
Chief Complaint: GERD, rectal spasm  GI History: Victoria Moore a 43y.o.femalewith a history of reflux, starting in 2018, along with dysphagia starting in 2019.Index sxs HB, regurgitation with infrequent raspy voice and dry cough.Sleeps in a recliner at night due to regurgitation and waterbrash. She has trialed multiple acid suppressants/antacids. Zantac previously worked buteventually lost efficacy. No benefit with OTC Nexium.Protonix 40 mg/day initially worked, but again lost efficacy andshe increased to TID without improvement at all. Changed to Dexilant 60 mg/day, which worked for about a month, then lost efficacy (along with cost prohibitive). She reverted back to Nexium 60 mg/day, but again with breakthrough symptoms of heartburn, regurgitation, sour brash.Worse with supine and post prandial.   Dysphagiaoccurs intermittently,solids > liquids,pointing to mid chest. No history of food impaction. BM normal.  Some improvement with empiric dilation and 09/2019. No changes in weight and no fever, chills, night sweats.   She has had infrequent episodes of nausea/vomiting.  Additionally, she has a history of iron deficiency anemia dating back to 2015 with iron 10 and ferritin 3 at that time. Hemoglobin as low as 6.8 in 2015, requiring admission with pRBCtransfusion. Hysterectomy for menorrhagia and fibroids in March 2017,with overall improvement in baseline hemoglobin and iron indices.Peak of 12.4 in November 2018,and has mildly down trended since that time to 10.8in 9/2018despite ongoing oral iron therapy.Hgbrange10.8-12.7 since hysterectomy. MCV improved from 02/2016 (75) to stable mid 80's.Ironindices improved, but still requiring BID ferrous sulfate.No overt GIB.EGD in 09/2018 unrevealing.  06/2019: H/H 11.6/38.3 with MCV 88. 07/2018:Iron 46, TIBC 360, ferritin 10, iron sat 13%. Vitamin D 20.Normal B12/folate  Endoscopic history:  -EGD (09/2019, Dr. Bryan Lemma): Normal esophagus, dilated with 54Fr Venia Minks without mucosal rent, normal Z-line, non-H. pylori gastritis -Colonoscopy (08/2019, Dr. Bryan Lemma): 4 benign polyps in the sigmoid, 2 in the rectum, internal hemorrhoids.  Otherwise normal.  Repeat in 10 years. -EGD (09/2018, Dr. Bryan Lemma): Schatzki's ring, dilated with 54Fr(no ring) then fracture with forceps, normal Z line, Hill grade 2 valve, mild non-H. pylori gastritis, normal duodenum - EM (08/2019): 2.1 cm HH, low EG resting pressure, but otherwise normal with appropriate relaxation of the LES and normal motility -pH/impedance (08/2019, off PPI): Significant acid reflux with pH <4 18.9% of the time and JD score 72.4.  Negative symptom correlation.  Overall normal number of reflux episodes at 50, with 34 being acid refluxate.  However significant time of acid reflux certainly consistent with GERD   HPI:    Due to current restrictions/limitations of in-office visits due to the COVID-19 pandemic, this scheduled clinical appointment was converted to a telehealth virtual consultation using [employer].  -Time of medical discussion: 25 minutes -The patient did consent to this virtual visit and is aware of possible charges through their insurance for this visit.  -Names of all parties present: Victoria Moore (patient), Gerrit Heck, DO, [facility] (physician) -Patient location: Home -Physician location: Office  Victoria Moore is a 43 y.o. female referred to the Gastroenterology Clinic for follow-up.  Was last seen by me in 08/2019 for ongoing reflux with significant esophageal acid exposure noted on pH/impedance testing in 08/2019.  Repeat endoscopy demonstrates normal Z-line and non-H. pylori mild gastritis.  Was continued on Dexilant and Pepcid and she has since been seen by Dr. Redmond Pulling at [place], but unfortunately he is not in network. Has started to have breakthrough with the Dexilant, taking Pepcid BID.   Was also seen in the  cardiology clinic in 09/2019  with chest pain/sternal tenderness, diagnosed with MSK pain.  Placed on event monitor for intermittent palpitations.  Source of iron deficiency suspected due to acid suppression therapy.  EGD/colonoscopy otherwise unrevealing.  Have not completed small bowel interrogation, but most recent CBC in 08/2019 with normalization of hemoglobin to 13.7.  Separately, is again having intermittent intense rectal spasm pain. Typically lasts a few minutes, but can recur through the day.  No exacerbating/alleviating factors.  Symptoms restarted yesterday. Last episode was o/w a couple months ago.   Past medical history, past surgical history, social history, family history, medications, and allergies reviewed in the chart and with patient.    Past Medical History:  Diagnosis Date  . Acute low back pain without sciatica 06/22/2018  . ADHD   . Allergy   . Anemia   . Anxiety   . Arthritis   . 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 Fibromyalgia, doubt sarcoid   . Asthma   . Blood transfusion without reported diagnosis   . Chest wall pain 05/26/2018  . Chronic kidney disease   . Chronic low back pain with sciatica 09/15/2018  . Common migraine with intractable migraine 10/07/2016  . Cough variant asthma vs UACS  10/23/2016   - Spirometry 04/15/2016  Very truncated exp loop effort dep portion only  - Allergy profile 10/22/2016 >  Eos 0.2/  IgE  52 RAST POS grass/trees/ragweed  10/22/2016  After extensive coaching HFA effectiveness =    75% try duelra 100 2bid > improved  . Daytime sleepiness 05/26/2018  . Decreased pedal pulses 10/04/2018  . Depression   . Depression, major, single episode, moderate ([place]) 05/26/2018  . Diabetes ([place])   . Dyspnea 04/15/2016   04/15/2016  Walked RA x 3 laps @ 185 ft each stopped due to  End of study, nl pace, no sob or desat    - Spirometry 04/15/2016  Very truncated exp loop effort dep portion only  -  10/22/2016  Walked RA x 3 laps @ 185 ft each stopped due to  End of study, slow pace, min sob/ no desat - full pfts rec 10/22/2016 >>>      . Edema   . Essential hypertension, benign 11/15/2016  . Fibroids 03/11/2016  . Frequency of urination 09/11/2018  . GERD (gastroesophageal reflux disease)   . Herniated lumbar intervertebral disc   . Hiatal hernia   . Hilar adenopathy   . Hypertension   . Hypokalemia 06/28/2018  . Impaired fasting blood sugar 09/11/2018  . Iron deficiency   . Leg pain 12/07/2018  . Low libido 11/27/2018  . Migraine   . Mild sleep apnea 08/03/2018   HST 06/20/18  AHI  8.1 / snoring with 02 nadir 80% >  08/03/2018 rec sleep medicine consultation   . Morbid (severe) obesity due to excess calories ([place]) 10/23/2016  . Personal history of sarcoidosis 05/26/2018  . Prolonged capillary refill time   . Right leg swelling 08/18/2018  . Sarcoidosis    personal history of  . Seizures ([place])    history of   . Sleep apnea   . Spondylosis   . Vitamin D deficiency      Past Surgical History:  Procedure Laterality Date  . ABDOMINAL HYSTERECTOMY    . ESOPHAGEAL MANOMETRY N/A 09/05/2019   Procedure: ESOPHAGEAL MANOMETRY (EM);  Surgeon: Lavena Bullion, DO;  Location: [facility];  Service: Gastroenterology;  Laterality: N/A;  . LAPAROSCOPIC  OVARIAN CYSTECTOMY Left 03/11/2016   Procedure: LAPAROSCOPIC OVARIAN CYSTECTOMY;  Surgeon: Eldred Manges, MD;  Location: [facility];  Service: Gynecology;  Laterality: Left;  . LAPAROSCOPIC VAGINAL HYSTERECTOMY WITH SALPINGECTOMY Bilateral 03/11/2016   Procedure: LAPAROSCOPIC ASSISTED VAGINAL HYSTERECTOMY WITH SALPINGECTOMY;  Surgeon: Eldred Manges, MD;  Location: [facility];  Service: Gynecology;  Laterality: Bilateral;  . [place] IMPEDANCE STUDY  09/05/2019   Procedure: [place] IMPEDANCE STUDY;  Surgeon: Lavena Bullion, DO;  Location: [facility];  Service: Gastroenterology;;  . TUBAL LIGATION    . UPPER GASTROINTESTINAL ENDOSCOPY  10/11/2019   06/2019    Family History  Adopted: Yes  Problem Relation Age of Onset  . Other Son        Growing pains  . Asthma Mother   . Heart Problems Mother   . Migraines Mother   . COPD Mother   . Heart attack Mother   . Diabetes Father   . Peptic Ulcer Father   . Heart Problems Father   . COPD Father   . Heart attack Father   . Colon cancer Neg Hx   . Colon polyps Neg Hx   . Esophageal cancer Neg Hx   . Rectal cancer Neg Hx   . Stomach cancer Neg Hx    Social History   Tobacco Use  . Smoking status: Former Smoker    Packs/day: 0.25    Years: 17.00    Pack years: 4.25    Types: Cigarettes    Quit date: 03/02/2013    Years since quitting: 6.6  . Smokeless tobacco: Never Used  Substance Use Topics  . Alcohol use: No    Alcohol/week: 0.0 standard drinks  . Drug use: Not Currently    Types: Marijuana    Comment: former - 6 yrs ago   Current Outpatient Medications  Medication Sig Dispense Refill  . acetaZOLAMIDE (DIAMOX) 125 MG tablet 1 tablet twice daily for 1 week, then take 2 tablets twice daily 120 tablet 2  . albuterol (PROVENTIL) (2.5 MG/3ML) 0.083% nebulizer solution Take 2.5 mg by nebulization every 6 (six) hours as needed for wheezing or shortness of breath.    Marland Kitchen albuterol (VENTOLIN HFA) 108 (90 Base) MCG/ACT inhaler Inhale 1-2 puffs into the lungs every 4 (four) hours as needed for wheezing or shortness of breath. 8 g 3  . ALPRAZolam (XANAX) 1 MG tablet Take 0.5-1 tablets (0.5-1 mg total) by mouth at bedtime as needed for anxiety. 30 tablet 0  . amphetamine-dextroamphetamine (ADDERALL XR) 30 MG 24 hr capsule Take 1 capsule (30 mg total) by mouth daily as needed (ADHD). 30 capsule 0  . chlorthalidone (HYGROTON) 25 MG tablet TAKE 1 TABLET BY MOUTH EVERY DAY 90 tablet 1  . citalopram (CELEXA) 20 MG tablet Take 1 tablet (20 mg total) by mouth daily. 30 tablet 3  . famotidine (PEPCID) 40 MG tablet Take 1 tablet (40 mg total) by mouth 2 (two) times daily. 180 tablet 5  . ferrous  sulfate 325 (65 FE) MG tablet Take 325 mg by mouth daily with breakfast.    . levETIRAcetam (KEPPRA) 500 MG tablet 1/2 tablet twice daily for 1 week and then take 1 full tablet twice daily 60 tablet 5  . meclizine (ANTIVERT) 25 MG tablet Take 1 tablet (25 mg total) by mouth 3 (three) times daily as needed for dizziness. 30 tablet 0  . meloxicam (MOBIC) 7.5 MG tablet Take 1 tablet (7.5 mg total) by mouth daily. 10 tablet 0  .  metFORMIN (GLUCOPHAGE) 500 MG tablet Take 1 tablet by mouth daily.     . mometasone-formoterol (DULERA) 100-5 MCG/ACT AERO Inhale 2 puffs into the lungs 2 (two) times daily. 13 g 3  . nortriptyline (PAMELOR) 10 MG capsule TAKE 1 CAPSULE AT NIGHT FOR ONE WEEK, 2 CAPSULES AT NIGHT FOR ONE WEEK, THEN 3 CAPSULES AT NIGHT 270 capsule 2  . spironolactone (ALDACTONE) 25 MG tablet TAKE 1 TABLET BY MOUTH EVERY DAY 90 tablet 1   Current Facility-Administered Medications  Medication Dose Route Frequency Provider Last Rate Last Dose  . nitroGLYCERIN (NITROSTAT) SL tablet 0.4 mg  0.4 mg Sublingual Q5 min PRN Ngetich, Dinah C, NP   0.4 mg at 09/14/19 1621   Allergies  Allergen Reactions  . Aspirin Anaphylaxis and Hives          Review of Systems: All systems reviewed and negative except where noted in HPI.     Physical Exam:    Complete physical exam not completed due to the nature of this telehealth communication.   Gen: Awake, alert, and oriented, and well communicative. HEENT: EOMI, non-icteric sclera, NCAT, MMM Neck: Normal movement of head and neck Pulm: No labored breathing, speaking in full sentences without conversational dyspnea Derm: No apparent lesions or bruising in visible field MS: Moves all visible extremities without noticeable abnormality Psych: Pleasant, cooperative, normal speech, thought processing seemingly intact   ASSESSMENT AND PLAN;   1) GERD 2) Hiatal hernia  -Improved with Dexilant, but increasing breakthrough symptoms requiring Pepcid bid  -Was seen by Dr. Redmond Pulling at [facility] who agrees that gastric bypass would be the most beneficial therapeutic intervention.  Unfortunately, he is out of network, so she needs to find a Chiropodist.  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  Unfortunately, not a lot of great treatment options.  Could consider topical nitroglycerin or topical CCB, but ADR profile of headache and BP disruption seems less favorable to her.  While only scant literature to support, can trial course of antispasmodic such as Levsin to use on days where she seems to have recurrent spasms.  Otherwise, reassurance of an otherwise benign process.  4) Iron deficiency: -No overt GI blood loss.  EGD/colonoscopy otherwise unrevealing.  Certainly could be 2/2 high-dose acid suppression therapy -Recent normal CBC -Repeat iron indices in 3 months to ensure normalization  RTC in 6 months or sooner as needed  I spent a total of 25 minutes of virtual face-to-face time with the patient. Greater than 50% of the time was spent counseling and coordinating care.    Lavena Bullion, DO, FACG  11/05/2019, 1:21 PM   Ngetich, Nelda Bucks, NP

## 2019-11-06 ENCOUNTER — Telehealth: Payer: Self-pay | Admitting: Neurology

## 2019-11-06 ENCOUNTER — Telehealth: Payer: Self-pay

## 2019-11-06 ENCOUNTER — Telehealth: Payer: PRIVATE HEALTH INSURANCE | Admitting: Family

## 2019-11-06 DIAGNOSIS — G932 Benign intracranial hypertension: Secondary | ICD-10-CM

## 2019-11-06 DIAGNOSIS — R350 Frequency of micturition: Secondary | ICD-10-CM

## 2019-11-06 DIAGNOSIS — M545 Low back pain, unspecified: Secondary | ICD-10-CM

## 2019-11-06 DIAGNOSIS — N898 Other specified noninflammatory disorders of vagina: Secondary | ICD-10-CM

## 2019-11-06 MED ORDER — ACETAZOLAMIDE ER 500 MG PO CP12: 500.0000 mg | ORAL_CAPSULE | Freq: Two times a day (BID) | ORAL | 3 refills | Status: DC

## 2019-11-06 NOTE — Telephone Encounter (Signed)
I spoke to a referral coordinator at [facility] who states that the patient's insurance is in there network. Per there policy, the patient will need to log onto [email or web address] and watch a video,then be prompted to submit an application. A nurse will review the application a notify the patient with an appointment date and time. Patient was notified of this information and has agreed to proceed with the process.

## 2019-11-06 NOTE — Progress Notes (Signed)
Based on what you shared with me, I feel your condition warrants further evaluation and I recommend that you be seen for a face to face office visit.  Given you are having vaginal discharge with UTI symptoms you need to be seen face to face. Usually with yeast you do not have urinary frequency and low back pain.   NOTE: If you entered your credit card information for this eVisit, you will not be charged. You may see a "hold" on your card for the $35 but that hold will drop off and you will not have a charge processed.  If you are having a true medical emergency please call 911.     For an urgent face to face visit, [place] has four urgent care centers for your convenience:    NEW:  [facility]   [email or web address]%2c+[address]+[place]%2c+[place]+[address]                                                   [address] [place], [place] [address] .  Monday - Friday 10 am - 6 pm    . [facility]    [phone]                  Get Driving Directions  [id number] [place], [place] [address] . 10 am to 8 pm Monday-Friday . 12 pm to 8 pm Saturday-Sunday   . [facility] [place]                  Get Driving Directions  [id number] [place], [place], [place] [address] . 8 am to 8 pm Monday-Friday . 9 am to 6 pm Saturday . 11 am to 6 pm Sunday     . [facility] [place]                  Get Driving Directions   [address] [place], [place] [address] . 8 am to 8 pm Monday-Friday . 8 am to 4 pm Saturday-Sunday    . [facility] [place]                    Get Driving Directions  [id number]  [address] [place], [place] [address]  . Monday-Friday, 12 PM to 6 PM    Your e-visit answers  were reviewed by a board certified advanced clinical practitioner to complete your personal care plan.  Thank you for using e-Visits.

## 2019-11-06 NOTE — Telephone Encounter (Signed)
I called the patient.  The patient is having episodes of visual loss, the headaches have gotten worse, she is on Diamox taking 250 mg twice daily.  The patient's history is concerning that the spinal fluid pressure may be going up instead of coming down on acetazolamide.  I will increase the Diamox dose to 500 mg twice daily, I will get her set up for another lumbar puncture to be done to look at the opening pressure.  She claims that her blood pressures have been normal, she is not hypotensive.

## 2019-11-07 ENCOUNTER — Ambulatory Visit: Payer: PRIVATE HEALTH INSURANCE | Admitting: Family

## 2019-11-07 ENCOUNTER — Telehealth: Payer: Self-pay

## 2019-11-07 ENCOUNTER — Ambulatory Visit (INDEPENDENT_AMBULATORY_CARE_PROVIDER_SITE_OTHER): Payer: PRIVATE HEALTH INSURANCE | Admitting: Family

## 2019-11-07 ENCOUNTER — Encounter: Payer: Self-pay | Admitting: Family

## 2019-11-07 ENCOUNTER — Other Ambulatory Visit: Payer: Self-pay

## 2019-11-07 VITALS — BP 134/80 | HR 68 | Temp 97.5°F | Ht 72.0 in | Wt 313.8 lb

## 2019-11-07 DIAGNOSIS — B3731 Acute candidiasis of vulva and vagina: Secondary | ICD-10-CM

## 2019-11-07 DIAGNOSIS — B373 Candidiasis of vulva and vagina: Secondary | ICD-10-CM

## 2019-11-07 DIAGNOSIS — R399 Unspecified symptoms and signs involving the genitourinary system: Secondary | ICD-10-CM

## 2019-11-07 DIAGNOSIS — Z124 Encounter for screening for malignant neoplasm of cervix: Secondary | ICD-10-CM | POA: Diagnosis not present

## 2019-11-07 LAB — POCT URINALYSIS DIPSTICK
Blood, UA: NEGATIVE
Glucose, UA: NEGATIVE
Ketones, UA: NEGATIVE
Nitrite, UA: NEGATIVE
Protein, UA: POSITIVE — AB
Spec Grav, UA: 1.01 (ref 1.010–1.025)
Urobilinogen, UA: 0.2 E.U./dL
pH, UA: 8.5 — AB (ref 5.0–8.0)

## 2019-11-07 MED ORDER — FLUCONAZOLE 150 MG PO TABS: ORAL_TABLET | ORAL | 0 refills | Status: DC

## 2019-11-07 NOTE — Telephone Encounter (Signed)
Patient called to see if it was alright to come to her appointment at 3:45 because she can't get from work any quicker I noted this on her appointment for her

## 2019-11-07 NOTE — Progress Notes (Signed)
Provider: Marlowe Sax FNP-C  Bobi Daudelin, Nelda Bucks, NP  Patient Care Team: Zniyah Midkiff, Nelda Bucks, NP as PCP - General (Family Medicine)  Extended Emergency Contact Information Primary Emergency Contact: [facility] Address: [address]          [place], [place] [address] Johnnette Litter of [place] Phone: [phone] Mobile Phone: [phone] Relation: Significant other Secondary Emergency Contact: Denzil Magnuson Home Phone: [phone] Mobile Phone: [phone] Relation: Mother  Code Status:  Full code  Goals of care: Advanced Directive information Advanced Directives 10/08/2019  Does Patient Have a Medical Advance Directive? No  Would patient like information on creating a medical advance directive? No - Patient declined     Chief Complaint  Patient presents with   Acute Visit    Possible yeast infection patient states duration of a week and frequency in urination and itches after and before going     HPI:  Pt is a 43 y.o. female seen today for an acute visit for evaluation of possible yeast infection x 1 week.she states has had whitish thick vaginal discharge.Vaginal itching a lot.Also complains of increased urine frequency and burning before and after urination.she denies any fever,chills,abdominal pain,nausea or vomiting.she denies having multiple or new sexual partners.    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 Fibromyalgia, doubt sarcoid    Asthma    Blood transfusion without reported diagnosis    Chest wall pain 05/26/2018   Chronic kidney disease    Chronic low back pain with sciatica 09/15/2018   Common migraine with intractable migraine 10/07/2016   Cough variant asthma vs UACS  10/23/2016   - Spirometry 04/15/2016  Very truncated exp loop effort dep  portion only  - Allergy profile 10/22/2016 >  Eos 0.2/  IgE  52 RAST POS grass/trees/ragweed  10/22/2016  After extensive coaching HFA effectiveness =    75% try duelra 100 2bid > improved   Daytime sleepiness 05/26/2018   Decreased pedal pulses 10/04/2018   Depression    Depression, major, single episode, moderate ([place]) 05/26/2018   Diabetes ([place])    Dyspnea 04/15/2016   04/15/2016  Walked RA x 3 laps @ 185 ft each stopped due to  End of study, nl pace, no sob or desat    - Spirometry 04/15/2016  Very truncated exp loop effort dep portion only  - 10/22/2016  Walked RA x 3 laps @ 185 ft each stopped due to  End of study, slow pace, min sob/ no desat - full pfts rec 10/22/2016 >>>       Edema    Essential hypertension, benign 11/15/2016   Fibroids 03/11/2016   Frequency of urination 09/11/2018   GERD (gastroesophageal reflux disease)    Herniated lumbar intervertebral disc    Hiatal hernia    Hilar adenopathy    Hypertension    Hypokalemia 06/28/2018   Impaired fasting blood sugar 09/11/2018   Iron deficiency    Leg pain 12/07/2018   Low libido 11/27/2018   Migraine    Mild sleep apnea 08/03/2018   HST 06/20/18  AHI  8.1 / snoring with 02 nadir 80% >  08/03/2018 rec sleep medicine consultation    Morbid (severe) obesity due to excess calories ([place]) 10/23/2016  Personal history of sarcoidosis 05/26/2018   Prolonged capillary refill time    Right leg swelling 08/18/2018   Sarcoidosis    personal history of   Seizures ([place])    history of    Sleep apnea    Spondylosis    Vitamin D deficiency    Past Surgical History:  Procedure Laterality Date   ABDOMINAL HYSTERECTOMY     ESOPHAGEAL MANOMETRY N/A 09/05/2019   Procedure: ESOPHAGEAL MANOMETRY (EM);  Surgeon: Lavena Bullion, DO;  Location: [facility];  Service: Gastroenterology;  Laterality: N/A;   LAPAROSCOPIC OVARIAN CYSTECTOMY Left 03/11/2016   Procedure: LAPAROSCOPIC OVARIAN CYSTECTOMY;  Surgeon: Eldred Manges, MD;  Location: [facility];  Service: Gynecology;  Laterality: Left;   LAPAROSCOPIC VAGINAL HYSTERECTOMY WITH SALPINGECTOMY Bilateral 03/11/2016   Procedure: LAPAROSCOPIC ASSISTED VAGINAL HYSTERECTOMY WITH SALPINGECTOMY;  Surgeon: Eldred Manges, MD;  Location: [facility];  Service: Gynecology;  Laterality: Bilateral;   [place] IMPEDANCE STUDY  09/05/2019   Procedure: [place] IMPEDANCE STUDY;  Surgeon: Lavena Bullion, DO;  Location: [facility];  Service: Gastroenterology;;   TUBAL LIGATION     UPPER GASTROINTESTINAL ENDOSCOPY  10/11/2019   06/2019    Allergies  Allergen Reactions   Aspirin Anaphylaxis and Hives         Outpatient Encounter Medications as of 11/07/2019  Medication Sig   acetaZOLAMIDE (DIAMOX) 500 MG capsule Take 1 capsule (500 mg total) by mouth 2 (two) times daily.   albuterol (VENTOLIN HFA) 108 (90 Base) MCG/ACT inhaler Inhale 1-2 puffs into the lungs every 4 (four) hours as needed for wheezing or shortness of breath.   ALPRAZolam (XANAX) 1 MG tablet Take 0.5-1 tablets (0.5-1 mg total) by mouth at bedtime as needed for anxiety.   amphetamine-dextroamphetamine (ADDERALL XR) 30 MG 24 hr capsule Take 1 capsule (30 mg total) by mouth daily as needed (ADHD).   chlorthalidone (HYGROTON) 25 MG tablet TAKE 1 TABLET BY MOUTH EVERY DAY   citalopram (CELEXA) 20 MG tablet Take 1 tablet (20 mg total) by mouth daily.   famotidine (PEPCID) 40 MG tablet Take 1 tablet (40 mg total) by mouth 2 (two) times daily.   ferrous sulfate 325 (65 FE) MG tablet Take 325 mg by mouth daily with breakfast.   hyoscyamine (LEVSIN SL) 0.125 MG SL tablet Place 1 tablet (0.125 mg total) under the tongue every 6 (six) hours as needed.   levETIRAcetam (KEPPRA) 500 MG tablet 1/2 tablet twice daily for 1 week and then take 1 full tablet twice daily   meclizine (ANTIVERT) 25 MG tablet Take 1 tablet (25 mg total) by mouth 3 (three) times daily as needed for dizziness.   meloxicam (MOBIC) 7.5 MG  tablet Take 1 tablet (7.5 mg total) by mouth daily.   mometasone-formoterol (DULERA) 100-5 MCG/ACT AERO Inhale 2 puffs into the lungs 2 (two) times daily.   nortriptyline (PAMELOR) 10 MG capsule TAKE 1 CAPSULE AT NIGHT FOR ONE WEEK, 2 CAPSULES AT NIGHT FOR ONE WEEK, THEN 3 CAPSULES AT NIGHT   spironolactone (ALDACTONE) 25 MG tablet TAKE 1 TABLET BY MOUTH EVERY DAY   albuterol (PROVENTIL) (2.5 MG/3ML) 0.083% nebulizer solution Take 2.5 mg by nebulization every 6 (six) hours as needed for wheezing or shortness of breath.   metFORMIN (GLUCOPHAGE) 500 MG tablet Take 1 tablet by mouth daily.    Facility-Administered Encounter Medications as of 11/07/2019  Medication   nitroGLYCERIN (NITROSTAT) SL tablet 0.4 mg    Review of Systems  Constitutional: Negative for chills  and fever.  Gastrointestinal: Negative for abdominal distention, abdominal pain, nausea and vomiting.  Genitourinary: Positive for dysuria, frequency and urgency. Negative for decreased urine volume and difficulty urinating.  Psychiatric/Behavioral: Negative for agitation. The patient is not nervous/anxious.     Immunization History  Administered Date(s) Administered   Influenza-Unspecified 08/03/2019   PPD Test 04/20/2017   Pneumococcal Polysaccharide-23 11/13/2014   Td 10/18/2015   Tdap 05/08/2014, 01/15/2016   Pertinent  Health Maintenance Due  Topic Date Due   PAP SMEAR-Modifier  05/26/2020   INFLUENZA VACCINE  Completed   Fall Risk  12/17/2016  Falls in the past year? No    Vitals:   11/07/19 1543  BP: 134/80  Pulse: 68  Temp: (!) 97.5 F (36.4 C)  TempSrc: Temporal  SpO2: 98%  Weight: (!) 313 lb 12.8 oz (142.3 kg)  Height: 6' (1.829 m)   Body mass index is 42.56 kg/m. Physical Exam Vitals signs reviewed.  Constitutional:      General: She is not in acute distress.    Appearance: She is obese. She is not ill-appearing.  Eyes:     General: No scleral icterus.       Right eye: No  discharge.        Left eye: No discharge.     Extraocular Movements: Extraocular movements intact.     Conjunctiva/sclera: Conjunctivae normal.     Pupils: Pupils are equal, round, and reactive to light.  Cardiovascular:     Rate and Rhythm: Normal rate and regular rhythm.     Pulses: Normal pulses.     Heart sounds: Normal heart sounds. No murmur. No friction rub. No gallop.   Pulmonary:     Effort: Pulmonary effort is normal. No respiratory distress.     Breath sounds: Normal breath sounds. No wheezing, rhonchi or rales.  Chest:     Chest wall: No tenderness.  Abdominal:     General: Bowel sounds are normal. There is no distension.     Palpations: Abdomen is soft. There is no mass.     Tenderness: There is no abdominal tenderness. There is no right CVA tenderness, left CVA tenderness, guarding or rebound.     Hernia: There is no hernia in the left inguinal area or right inguinal area.  Genitourinary:    General: Normal vulva.     Exam position: Lithotomy position.     Pubic Area: No rash or pubic lice.      Labia:        Right: No rash, tenderness or lesion.        Left: No rash, tenderness or lesion.      Urethra: No prolapse, urethral pain, urethral swelling or urethral lesion.     Vagina: No foreign body. Vaginal discharge present. No erythema, tenderness, bleeding, lesions or prolapsed vaginal walls.     Cervix: Discharge present. No friability or erythema.     Uterus: Absent.      Adnexa:        Right: No tenderness.         Left: No tenderness.    Musculoskeletal: Normal range of motion.        General: No swelling or tenderness.     Right lower leg: No edema.     Left lower leg: No edema.  Lymphadenopathy:     Lower Body: No right inguinal adenopathy. No left inguinal adenopathy.  Skin:    General: Skin is warm and dry.     Coloration: Skin  is not pale.     Findings: No bruising, erythema or rash.  Neurological:     Mental Status: She is alert and oriented to  person, place, and time.     Cranial Nerves: No cranial nerve deficit.     Sensory: No sensory deficit.     Gait: Gait normal.  Psychiatric:        Mood and Affect: Mood normal.        Behavior: Behavior normal.        Thought Content: Thought content normal.        Judgment: Judgment normal.     Labs reviewed: Recent Labs    08/22/19 1540 09/11/19 0050 09/14/19 1718  NA 138 138 137  K 4.3 3.5 3.3*  CL 104 101 99  CO2 25 26 27   GLUCOSE 120* 113* 134*  BUN 11 10 13   CREATININE 0.96 1.01* 1.17*  CALCIUM 9.0 9.2 9.3   Recent Labs    04/18/19 0154 06/25/19 1109 08/22/19 1540  AST 21 17 13   ALT 17 12 10   ALKPHOS 50 50  --   BILITOT 0.4 0.2* 0.2  PROT 6.8 7.0 6.8  ALBUMIN 3.8 3.8  --    Recent Labs    04/18/19 0154 06/25/19 1109  08/22/19 1540 09/11/19 0050 09/14/19 1718  WBC 5.1 4.4   < > 5.0 5.6 6.5  NEUTROABS 2.5 2.5  --  3,115  --   --   HGB 11.7* 11.5*   < > 11.6* 13.3 13.7  HCT 37.3 36.8   < > 36.1 42.1 42.5  MCV 84.8 88.5   < > 84.5 89.0 87.3  PLT 216 199   < > 240 199 252   < > = values in this interval not displayed.   Lab Results  Component Value Date   TSH 1.21 08/22/2019   Lab Results  Component Value Date   HGBA1C 6.1 (H) 08/22/2019   Lab Results  Component Value Date   CHOL 192 08/22/2019   HDL 53 08/22/2019   LDLCALC 118 (H) 08/22/2019   TRIG 105 08/22/2019   CHOLHDL 3.6 08/22/2019    Significant Diagnostic Results in last 30 days:  Dg Fl Guided Lumbar Puncture  Result Date: 10/18/2019 CLINICAL DATA:  Headaches.  Concern for intracranial hypertension. EXAM: DIAGNOSTIC LUMBAR PUNCTURE UNDER FLUOROSCOPIC GUIDANCE FLUOROSCOPY TIME:  Fluoroscopy Time:  11 seconds Radiation Exposure Index (if provided by the fluoroscopic device): 6.4 mGy Number of Acquired Spot Images: 0 PROCEDURE: Informed consent was obtained from the patient prior to the procedure, including potential complications of headache, allergy, and pain. With the patient  prone, the lower back was prepped with Betadine. 1% Lidocaine was used for local anesthesia. Lumbar puncture was performed at the L3-L4 level via a right interlaminar approach using a 6 inch 20 gauge needle with return of clear, colorless CSF. As the needle is not long enough to reach the thecal sac with the patient in the left lateral decubitus position, opening pressure was measured with the patient prone. Estimated CSF opening pressure of 27 cm water. 15 ml of CSF were obtained for laboratory studies. Estimated closing pressure was 18 cm water. The patient tolerated the procedure well and there were no apparent complications. IMPRESSION: 1. Technically successful fluoroscopically guided lumbar puncture. 2. Elevated estimated CSF opening pressure of 27 cm water. Electronically Signed   By: Titus Dubin M.D.   On: 10/18/2019 14:42    Assessment/Plan 1. UTI symptoms Afebrile.Has had increased urine  frequency,itching,urgency and burning with urination and after. - POC Urinalysis Dipstick shows orange cloudy urine positive for protein and leukocytes.will send for C& S.  - Culture, Urine  2. Screening for cervical cancer Last pap smear 2018 hx hysterectomy.  - PAP, Image Guided [[facility]]  3. Vaginal candidiasis Yellow thick discharge noted on Vaginal exam.No tenderness noted. - fluconazole (DIFLUCAN) 150 MG tablet; Take one by mouth x 1 dose then repeat one tablet after 3 days.  Dispense: 2 tablet; Refill: 0  Family/ staff Communication: Reviewed plan of care with patient  Labs/tests ordered:  - Culture, Urine - PAP smear  Sandrea Hughs, NP

## 2019-11-08 ENCOUNTER — Telehealth: Payer: Self-pay

## 2019-11-08 ENCOUNTER — Telehealth: Payer: Self-pay | Admitting: Student

## 2019-11-08 LAB — PAP IG (IMAGE GUIDED)

## 2019-11-08 NOTE — Telephone Encounter (Signed)
I received a monitor alert (lead loss) from [employer] from 5:00 am 11/12.  Sande Rives, PA had already addressed it this morning with patient.  I called the patient and said "she was sleeping" at the time.  I reviewed with DOD (Dr Marlou Porch) and he agreed to monitor, no changes.

## 2019-11-08 NOTE — Telephone Encounter (Signed)
   Received page from [facility] about critical EKG. Called and spoke with [employer]. Around 4:06am, they received EKG reading with artifact and lead loss. However, they also received alert of "passing out." They tried to call the patient but were unable to reach her. I personally called and spoke with the patient. She denies any lightheadedness, dizziness, or syncope. She states she is still having some of the same chest pressure that she saw Dr. Acie Fredrickson for on 10/09/2019. Pain was felt to be musculoskeletal in nature and she was prescribed Meloxicam. She denies any worsening of chest pain. Otherwise, patient states she feels fine. Patient advised to call with any concerns. She thanked me for calling.  Will route to Dr. Acie Fredrickson so that he is aware.  Darreld Mclean, PA-C 11/08/2019 7:18 AM

## 2019-11-09 LAB — URINE CULTURE
MICRO NUMBER:: 1092637
Result:: NO GROWTH
SPECIMEN QUALITY:: ADEQUATE

## 2019-11-13 ENCOUNTER — Ambulatory Visit
Admission: RE | Admit: 2019-11-13 | Discharge: 2019-11-13 | Disposition: A | Discharge status: Home / Self Care | Payer: PRIVATE HEALTH INSURANCE | Source: Ambulatory Visit | Attending: Neurology | Admitting: Neurology

## 2019-11-13 ENCOUNTER — Other Ambulatory Visit: Payer: Self-pay

## 2019-11-13 ENCOUNTER — Ambulatory Visit: Payer: Self-pay | Admitting: Gastroenterology

## 2019-11-13 VITALS — BP 124/64 | HR 64

## 2019-11-13 DIAGNOSIS — G932 Benign intracranial hypertension: Secondary | ICD-10-CM

## 2019-11-13 NOTE — Discharge Instructions (Signed)

## 2019-11-14 ENCOUNTER — Telehealth: Payer: Self-pay | Admitting: Neurology

## 2019-11-14 DIAGNOSIS — K219 Gastro-esophageal reflux disease without esophagitis: Secondary | ICD-10-CM

## 2019-11-14 DIAGNOSIS — K449 Diaphragmatic hernia without obstruction or gangrene: Secondary | ICD-10-CM

## 2019-11-14 DIAGNOSIS — K594 Anal spasm: Secondary | ICD-10-CM

## 2019-11-14 MED ORDER — SUCRALFATE 1 G PO TABS: 1.0000 g | ORAL_TABLET | Freq: Two times a day (BID) | ORAL | 3 refills | Status: DC

## 2019-11-14 MED ORDER — DILTIAZEM GEL 2 %: 1.0000 "application " | Freq: Two times a day (BID) | CUTANEOUS | 1 refills | Status: DC | PRN

## 2019-11-14 NOTE — Telephone Encounter (Signed)
Called and spoke with patient-patient informed of MD recommendations; patient is agreeable with plan of care and patient verified pharmacy; RX sent to pharmacy of patient choice; Patient verbalized understanding of information/instructions;  Patient was advised to call the office at [phone] if questions/concerns arise;

## 2019-11-14 NOTE — Telephone Encounter (Signed)
Called and spoke with patient-patient informed of MD recommendations; patient is agreeable with plan of care; Patient verbalized understanding of information/instructions;  Patient was advised to call the office at [phone] if questions/concerns arise; -patient reports-due to insurance, she is not able to have the bariatric sx for at least 6 months-"clearance period" -patient wants to know is there anything that can be done in the meantime while she waits for bariatric sx? Please advise

## 2019-11-14 NOTE — Telephone Encounter (Signed)
Spinal fluid was 27 cm, similar to what was previously, it is not dangerously high, the patient does not need a VP shunt placement.  The patient will stay on the Diamox 500 mg twice daily over the next week, if the headaches continue she is to contact our office and we will go up to the 500 mg 3 times daily dosing.

## 2019-11-17 LAB — CSF CULTURE W GRAM STAIN
MICRO NUMBER:: 1109129
Result:: NO GROWTH
SPECIMEN QUALITY:: ADEQUATE

## 2019-11-17 LAB — CSF CELL COUNT WITH DIFFERENTIAL
RBC Count, CSF: 0 cells/uL
WBC, CSF: 0 cells/uL (ref 0–5)

## 2019-11-17 LAB — GLUCOSE, CSF: Glucose, CSF: 78 mg/dL (ref 40–80)

## 2019-11-17 LAB — PROTEIN, CSF: Total Protein, CSF: 30 mg/dL (ref 15–45)

## 2019-11-19 ENCOUNTER — Telehealth: Payer: Self-pay | Admitting: Internal Medicine

## 2019-11-19 ENCOUNTER — Other Ambulatory Visit: Payer: Self-pay | Admitting: Family

## 2019-11-19 DIAGNOSIS — B373 Candidiasis of vulva and vagina: Secondary | ICD-10-CM

## 2019-11-19 DIAGNOSIS — B3731 Acute candidiasis of vulva and vagina: Secondary | ICD-10-CM

## 2019-11-19 MED ORDER — FLUCONAZOLE 150 MG PO TABS: ORAL_TABLET | ORAL | 0 refills | Status: DC

## 2019-11-19 NOTE — Telephone Encounter (Signed)
Checked sample closet and there are no samples of Dulera available. Requests have been made by our office for replacment (per conversation with Ashley Akin, RN - who logs samples), but no response or delivery has been received.

## 2019-11-19 NOTE — Telephone Encounter (Signed)
I called the patient and made her aware we did not have any samples available. She was agreeable to try another sample medication if possible.  Based on emessage from patient, she will need to be scheduled with app for video/televisit as she has not been seen in a year.  Hold message in triage until app advises during visit of alternative.

## 2019-11-19 NOTE — Telephone Encounter (Signed)
Called patient since she had sent a MyChart message on this same matter. She has been scheduled to see TP tomorrow at 4pm. Will print the respiratory portion of her formulary for her visit tomorrow.   Nothing further needed at time of call.

## 2019-11-20 ENCOUNTER — Other Ambulatory Visit: Payer: Self-pay

## 2019-11-20 ENCOUNTER — Telehealth: Payer: Self-pay | Admitting: *Deleted

## 2019-11-20 ENCOUNTER — Encounter: Payer: Self-pay | Admitting: Adult Health

## 2019-11-20 ENCOUNTER — Ambulatory Visit (INDEPENDENT_AMBULATORY_CARE_PROVIDER_SITE_OTHER): Payer: PRIVATE HEALTH INSURANCE | Admitting: Adult Health

## 2019-11-20 DIAGNOSIS — J45991 Cough variant asthma: Secondary | ICD-10-CM

## 2019-11-20 MED ORDER — FLUTICASONE-SALMETEROL 100-50 MCG/DOSE IN AEPB: 1.0000 | INHALATION_SPRAY | Freq: Two times a day (BID) | RESPIRATORY_TRACT | 5 refills | Status: DC

## 2019-11-20 NOTE — Telephone Encounter (Signed)
pts insurance [employer] NIA Denied pt for LUMBAR MRI I called [employer] to see why the denial and they stated there was no documentation pt had Physical therapy within last 3-6 months. I called pt and asked if she had any type of PT and she stated no and was aware she had to have some PT in order for insurnace to cover. Pt asked about what she can do and I suggested maybe being self pay and working out a payment plan with imaging and to call them and see what can work with them. I also suggested [facility] or [facility] as an option to that may better suit her. Pt will think about it and call me back with decision. I will hold MRI order for one month.

## 2019-11-20 NOTE — Progress Notes (Signed)
Virtual Visit via Telephone Note  I connected with [name] on 11/20/19 at  4:00 PM EST by telephone and verified that I am speaking with the correct person using two identifiers.  Location: Patient: Home  Provider: Office    I discussed the limitations, risks, security and privacy concerns of performing an evaluation and management service by telephone and the availability of in person appointments. I also discussed with the patient that there may be a patient responsible charge related to this service. The patient expressed understanding and agreed to proceed.   History of Present Illness: 43 year old female former smoker followed for cough variant asthma and possible sarcoid (CT chest with hilar and subcarinal adenopathy)-no active symptoms.  Mild Obstructive sleep apnea (diagnosed June 2019) Medical history significant for hypertension  Today's televisit is for a follow-up of asthma.  Patient says she has been doing well since last visit.  She was last seen in December 2019.  She denies any flare cough or wheezing.  Says she has no increased shortness of breath.  She remains on Dulera twice a day.  No albuterol use.  Says that her insurance will not cover [facility] and she needs an alternative.  Her formulary says that Advair is the preferred inhaler.  Patient says her allergies have been well controlled with no increased nasal congestion.  She remains on Zyrtec daily .  Flu shot up-to-date Chest x-ray September 2020 showed clear lungs.   Sleep study showed mild OSA in 6.2019 . Did not start CPAP . Says she is going to be set up for it next week.      Observations/Objective:  See CT 11/12/14 R hilar/ subcarinal : These are not significant by size criteria (size not reported)  - f/u cxr 02/03/2016 no vz adenopathy and nl on 10/22/2016  - ACE 10/22/2016 = 22  - PFTs 10/25/16 FVC 3.28 (85%) s obstruction and dlco 70 corrects to 96% -HST 05/2018 >AHI 8.1/hr    Assessment and  Plan: Asthma/cough variant asthma well-controlled on current regimen.  Will need to change to Advair for insurance purposes.  Plan  Patient Instructions  Change Dulera to Advair 100 1 puff twice daily, rinse after use Continue on Zyrtec daily Albuterol inhaler as needed for wheezing and shortness of breath Activity as tolerated          Follow Up Instructions:  Follow up in 1 year and As needed    I discussed the assessment and treatment plan with the patient. The patient was provided an opportunity to ask questions and all were answered. The patient agreed with the plan and demonstrated an understanding of the instructions.   The patient was advised to call back or seek an in-person evaluation if the symptoms worsen or if the condition fails to improve as anticipated.  I provided 22  minutes of non-face-to-face time during this encounter.   Rexene Edison, NP

## 2019-11-20 NOTE — Patient Instructions (Addendum)
Change Dulera to Advair 100 1 puff twice daily, rinse after use Continue on Zyrtec daily Albuterol inhaler as needed for wheezing and shortness of breath Activity as tolerated Follow up in 1 year and As needed  With Dr. Melvyn Novas

## 2019-11-21 ENCOUNTER — Encounter: Payer: Self-pay | Admitting: Neurology

## 2019-11-25 ENCOUNTER — Other Ambulatory Visit: Payer: PRIVATE HEALTH INSURANCE

## 2019-11-26 ENCOUNTER — Other Ambulatory Visit: Payer: PRIVATE HEALTH INSURANCE

## 2019-11-26 ENCOUNTER — Telehealth: Payer: Self-pay | Admitting: *Deleted

## 2019-11-26 NOTE — Telephone Encounter (Signed)
[  10:02 AM] Doralee Albino     [facility]!  I received call from patient to schedule MRI.  mr# [id number].  She said that [employer] told her okay to call here to schedule.  She said it was authorized but needed to be at our location.  she is scheduled for tomorrow at 4:45pm.    [10:06 AM] Daylene Posey     I will call them..but looks like on 11/20/19 [employer] denied it because she had to have PT. she told me she didnt have PT.   ?[10:19 AM] Daylene Posey     ok I just just spoke with [place] wiht [employer] and this has been denied due to pt not have PT within last 3-6 months, I actually spoke with pt on 11/20/19 and asked her if she had any PT and she stated no so I suggested she be self py adn work out a Agricultural consultant. Call ref # [id number]  [10:22 AM] Doralee Albino     well that is interesting.  I better call the patient and make sure she is clear on that.  Thank you so much!

## 2019-11-27 ENCOUNTER — Other Ambulatory Visit: Payer: PRIVATE HEALTH INSURANCE

## 2019-11-28 ENCOUNTER — Ambulatory Visit: Payer: Self-pay | Admitting: Neurology

## 2019-11-29 DIAGNOSIS — Z6841 Body Mass Index (BMI) 40.0 and over, adult: Secondary | ICD-10-CM | POA: Insufficient documentation

## 2019-12-05 ENCOUNTER — Other Ambulatory Visit: Payer: Self-pay

## 2019-12-05 ENCOUNTER — Telehealth: Payer: Self-pay | Admitting: Adult Health

## 2019-12-05 DIAGNOSIS — J45991 Cough variant asthma: Secondary | ICD-10-CM

## 2019-12-05 DIAGNOSIS — R42 Dizziness and giddiness: Secondary | ICD-10-CM

## 2019-12-05 DIAGNOSIS — B373 Candidiasis of vulva and vagina: Secondary | ICD-10-CM

## 2019-12-05 DIAGNOSIS — B3731 Acute candidiasis of vulva and vagina: Secondary | ICD-10-CM

## 2019-12-05 MED ORDER — ALPRAZOLAM 1 MG PO TABS: 0.5000 mg | ORAL_TABLET | Freq: Every evening | ORAL | 0 refills | Status: DC | PRN

## 2019-12-05 MED ORDER — FLUCONAZOLE 150 MG PO TABS: ORAL_TABLET | ORAL | 0 refills | Status: DC

## 2019-12-05 MED ORDER — MECLIZINE HCL 25 MG PO TABS: 25.0000 mg | ORAL_TABLET | Freq: Three times a day (TID) | ORAL | 0 refills | Status: DC | PRN

## 2019-12-05 NOTE — Telephone Encounter (Signed)
Pt requesting a refill on meloxicam. Would Dr. Acie Fredrickson like to refill this medication? Please address

## 2019-12-05 NOTE — Telephone Encounter (Signed)
I called and spoke with the patient and she states that she called [employer] and spoke with them and they state that since its been years since she received her machine and all the records have not been merged from when they were [employer] that she will have to bring the machine to them because she states it has their sticker on it. Nothing further is needed as the patient will take the machine to the store to get supplies.

## 2019-12-05 NOTE — Telephone Encounter (Signed)
Ok to fill these medications? Pt last seen 11/07/19 for UTI and yeast, please advise

## 2019-12-05 NOTE — Telephone Encounter (Signed)
This was the response from [employer]:    From: Mariann Barter  Sent: 12/05/2019 12:09 PM EST  To: Darlina Guys, Elon Alas, *  Subject: RE: Nebulizer tubing kits             We did not supply the nebulizer machine. She will need to either go thru company who supplied nebulizer or she can go instore and private pay for the nebulizer kits   SO, I sent the order to [facility] as this DME is listed as her DME in the chart.  However, this was their response:  Cletis Athens sent to Nadine Counts, [place] states [employer] as she is their patient please send to them   Estill Bamberg    The patient needs to be contacted to find out who supplied the nebulizer so we can get her the supplies she needs.

## 2019-12-06 ENCOUNTER — Other Ambulatory Visit: Payer: Self-pay | Admitting: Cardiovascular Disease

## 2019-12-06 DIAGNOSIS — R002 Palpitations: Secondary | ICD-10-CM

## 2019-12-06 DIAGNOSIS — R079 Chest pain, unspecified: Secondary | ICD-10-CM

## 2019-12-11 ENCOUNTER — Other Ambulatory Visit: Payer: Self-pay | Admitting: Neurology

## 2019-12-12 ENCOUNTER — Other Ambulatory Visit: Payer: Self-pay

## 2019-12-12 ENCOUNTER — Encounter: Payer: Self-pay | Admitting: Neurology

## 2019-12-12 ENCOUNTER — Ambulatory Visit (INDEPENDENT_AMBULATORY_CARE_PROVIDER_SITE_OTHER): Payer: PRIVATE HEALTH INSURANCE | Admitting: Neurology

## 2019-12-12 VITALS — BP 141/95 | HR 78 | Temp 97.7°F | Ht 72.0 in | Wt 315.4 lb

## 2019-12-12 DIAGNOSIS — G43019 Migraine without aura, intractable, without status migrainosus: Secondary | ICD-10-CM | POA: Diagnosis not present

## 2019-12-12 MED ORDER — ACETAZOLAMIDE ER 500 MG PO CP12: 500.0000 mg | ORAL_CAPSULE | Freq: Three times a day (TID) | ORAL | 3 refills | Status: DC

## 2019-12-12 NOTE — Progress Notes (Signed)
PATIENT: Victoria Moore DOB: 08/23/1976  REASON FOR VISIT: follow up HISTORY FROM: patient  HISTORY OF PRESENT ILLNESS: Today 12/12/19  Victoria Moore is a 43 year old female with history of seizures and migraine headaches.  She suffered a seizure in October, witnessed by her husband.  She has been placed on Keppra working up to 500 mg twice a day.  She had a CT of the head done with a CT angiogram that was unremarkable.  She was set up for lumbar puncture due to report of continued headache with some intermittent episodes of visual loss.  Opening pressure was elevated at 27, likely indicating the patient has pseudotumor cerebri.  She was started on Diamox.  She complained of increased headache, was sent for a repeat lumbar puncture, pressure was elevated at 27.  She remains on Diamox 500 mg twice a day.  She complains of continued daily headache, pressure in her head.  She indicates she will have times of blurry vision, and loss of vision every few days.  With the LP, she will have headache relief for a day or 2.  And the headache returns.  She has not had recurrent seizure.  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  She drives a car.  She says she is planning to have gastric bypass surgery.  She presents today for evaluation unaccompanied.  HISTORY 09/10/2019 Dr. Jannifer Franklin: Victoria Moore is a 43 year old right-handed black female who was last seen through this office in February 2018.  The patient has a history of seizures, she had a recurring seizure on 11 February 2017, she was placed on Topamax at that time working up to 50 mg twice daily dose to treat the seizures as well as migraine headaches.  The patient does have a longstanding history of migraine, but over the last year the headaches have converted from being intermittent in nature to daily in nature and now the headache is constant from sun up to sundown over the last month.  The patient reports that the headaches are throughout the  head, within the last day she has had episodes of darkness or loss of vision that will come and go for 6 to 7 seconds and then return.  She has neck stiffness and some discomfort down the spine, she also reports some muffled hearing but no pulsatile tinnitus.  The patient reports some occasional tingling in the hands but otherwise no numbness, she feels "weak all over.  She is working, she operates a Teacher, music, she has been off of any anticonvulsant medication for over a year and a half, she has not had any recurrent seizures.  The patient is obese but she claims that she has recently lost some weight.  The patient denies any significant balance issues or difficulty controlling the bowels or the bladder.  She has a history of sleep apnea, she has not yet gotten her CPAP machine.   REVIEW OF SYSTEMS: Out of a complete 14 system review of symptoms, the patient complains only of the following symptoms, and all other reviewed systems are negative.  Seizure, headache  ALLERGIES: Allergies  Allergen Reactions  . Aspirin Anaphylaxis and Hives         HOME MEDICATIONS: Outpatient Medications Prior to Visit  Medication Sig Dispense Refill  . acetaZOLAMIDE (DIAMOX) 500 MG capsule Take 1 capsule (500 mg total) by mouth 2 (two) times daily. 60 capsule 3  . albuterol (VENTOLIN HFA) 108 (90 Base) MCG/ACT inhaler Inhale 1-2  puffs into the lungs every 4 (four) hours as needed for wheezing or shortness of breath. 8 g 3  . ALPRAZolam (XANAX) 1 MG tablet Take 0.5-1 tablets (0.5-1 mg total) by mouth at bedtime as needed for anxiety. 30 tablet 0  . amphetamine-dextroamphetamine (ADDERALL XR) 30 MG 24 hr capsule Take 1 capsule (30 mg total) by mouth daily as needed (ADHD). 30 capsule 0  . chlorthalidone (HYGROTON) 25 MG tablet TAKE 1 TABLET BY MOUTH EVERY DAY 90 tablet 1  . citalopram (CELEXA) 20 MG tablet Take 1 tablet (20 mg total) by mouth daily. 30 tablet 3  . famotidine (PEPCID) 40 MG tablet Take 1 tablet  (40 mg total) by mouth 2 (two) times daily. 180 tablet 5  . ferrous sulfate 325 (65 FE) MG tablet Take 325 mg by mouth daily with breakfast.    . Fluticasone-Salmeterol (ADVAIR DISKUS) 100-50 MCG/DOSE AEPB Inhale 1 puff into the lungs 2 (two) times daily. 60 each 5  . hyoscyamine (LEVSIN SL) 0.125 MG SL tablet Place 1 tablet (0.125 mg total) under the tongue every 6 (six) hours as needed. 30 tablet 1  . levETIRAcetam (KEPPRA) 500 MG tablet 1/2 tablet twice daily for 1 week and then take 1 full tablet twice daily 60 tablet 5  . meclizine (ANTIVERT) 25 MG tablet Take 1 tablet (25 mg total) by mouth 3 (three) times daily as needed for dizziness. 30 tablet 0  . meloxicam (MOBIC) 7.5 MG tablet Take 1 tablet (7.5 mg total) by mouth daily. 10 tablet 0  . metFORMIN (GLUCOPHAGE) 500 MG tablet Take 1 tablet by mouth daily.     . mometasone-formoterol (DULERA) 100-5 MCG/ACT AERO Inhale 2 puffs into the lungs 2 (two) times daily. 13 g 3  . nortriptyline (PAMELOR) 10 MG capsule TAKE 1 CAPSULE AT NIGHT FOR ONE WEEK, 2 CAPSULES AT NIGHT FOR ONE WEEK, THEN 3 CAPSULES AT NIGHT 270 capsule 2  . spironolactone (ALDACTONE) 25 MG tablet TAKE 1 TABLET BY MOUTH EVERY DAY 90 tablet 1  . sucralfate (CARAFATE) 1 g tablet Take 1 tablet (1 g total) by mouth 2 (two) times daily. 90 tablet 3  . SUMAtriptan (IMITREX) 100 MG tablet TAKE 1 TABLET BY MOUTH 2X DAILY AS NEEDED FOR MIGRAINE. MAY REPEAT IN 2 HOURS IF HEADACHE PERSISTS OR RECURS. 9 tablet 3  . albuterol (PROVENTIL) (2.5 MG/3ML) 0.083% nebulizer solution Take 2.5 mg by nebulization every 6 (six) hours as needed for wheezing or shortness of breath.    . diltiazem 2 % GEL Apply 1 application topically 2 (two) times daily as needed. Apply a pea sized amount to rectal area when a spasm occurs (not to exceed twice daily) 30 g 1  . fluconazole (DIFLUCAN) 150 MG tablet Take one by mouth x 1 dose then repeat one tablet after 3 days. 2 tablet 0   Facility-Administered Medications  Prior to Visit  Medication Dose Route Frequency Provider Last Rate Last Admin  . nitroGLYCERIN (NITROSTAT) SL tablet 0.4 mg  0.4 mg Sublingual Q5 min PRN Ngetich, Dinah C, NP   0.4 mg at 09/14/19 1621    PAST MEDICAL HISTORY: Past Medical History:  Diagnosis Date  . Acute low back pain without sciatica 06/22/2018  . ADHD   . Allergy   . Anemia   . Anxiety   . Arthritis   . 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 Fibromyalgia, doubt sarcoid   . Asthma   .  Blood transfusion without reported diagnosis   . Chest wall pain 05/26/2018  . Chronic kidney disease   . Chronic low back pain with sciatica 09/15/2018  . Common migraine with intractable migraine 10/07/2016  . Cough variant asthma vs UACS  10/23/2016   - Spirometry 04/15/2016  Very truncated exp loop effort dep portion only  - Allergy profile 10/22/2016 >  Eos 0.2/  IgE  52 RAST POS grass/trees/ragweed  10/22/2016  After extensive coaching HFA effectiveness =    75% try duelra 100 2bid > improved  . Daytime sleepiness 05/26/2018  . Decreased pedal pulses 10/04/2018  . Depression   . Depression, major, single episode, moderate ([place]) 05/26/2018  . Diabetes ([place])   . Dyspnea 04/15/2016   04/15/2016  Walked RA x 3 laps @ 185 ft each stopped due to  End of study, nl pace, no sob or desat    - Spirometry 04/15/2016  Very truncated exp loop effort dep portion only  - 10/22/2016  Walked RA x 3 laps @ 185 ft each stopped due to  End of study, slow pace, min sob/ no desat - full pfts rec 10/22/2016 >>>      . Edema   . Essential hypertension, benign 11/15/2016  . Fibroids 03/11/2016  . Frequency of urination 09/11/2018  . GERD (gastroesophageal reflux disease)   . Herniated lumbar intervertebral disc   . Hiatal hernia   . Hilar adenopathy   . Hypertension   . Hypokalemia 06/28/2018  . Impaired fasting blood sugar 09/11/2018  . Iron deficiency   . Leg pain 12/07/2018  . Low libido 11/27/2018  .  Migraine   . Mild sleep apnea 08/03/2018   HST 06/20/18  AHI  8.1 / snoring with 02 nadir 80% >  08/03/2018 rec sleep medicine consultation   . Morbid (severe) obesity due to excess calories ([place]) 10/23/2016  . Personal history of sarcoidosis 05/26/2018  . Prolonged capillary refill time   . Right leg swelling 08/18/2018  . Sarcoidosis    personal history of  . Seizures ([place])    history of   . Sleep apnea   . Spondylosis   . Vitamin D deficiency     PAST SURGICAL HISTORY: Past Surgical History:  Procedure Laterality Date  . ABDOMINAL HYSTERECTOMY    . ESOPHAGEAL MANOMETRY N/A 09/05/2019   Procedure: ESOPHAGEAL MANOMETRY (EM);  Surgeon: Lavena Bullion, DO;  Location: [facility];  Service: Gastroenterology;  Laterality: N/A;  . LAPAROSCOPIC OVARIAN CYSTECTOMY Left 03/11/2016   Procedure: LAPAROSCOPIC OVARIAN CYSTECTOMY;  Surgeon: Eldred Manges, MD;  Location: [facility];  Service: Gynecology;  Laterality: Left;  . LAPAROSCOPIC VAGINAL HYSTERECTOMY WITH SALPINGECTOMY Bilateral 03/11/2016   Procedure: LAPAROSCOPIC ASSISTED VAGINAL HYSTERECTOMY WITH SALPINGECTOMY;  Surgeon: Eldred Manges, MD;  Location: [facility];  Service: Gynecology;  Laterality: Bilateral;  . [place] IMPEDANCE STUDY  09/05/2019   Procedure: [place] IMPEDANCE STUDY;  Surgeon: Lavena Bullion, DO;  Location: [facility];  Service: Gastroenterology;;  . TUBAL LIGATION    . UPPER GASTROINTESTINAL ENDOSCOPY  10/11/2019   06/2019    FAMILY HISTORY: Family History  Adopted: Yes  Problem Relation Age of Onset  . Other Son        Growing pains  . Asthma Mother   . Heart Problems Mother   . Migraines Mother   . COPD Mother   . Heart attack Mother   . Diabetes Father   . Peptic Ulcer Father   . Heart Problems Father   .  COPD Father   . Heart attack Father   . Colon cancer Neg Hx   . Colon polyps Neg Hx   . Esophageal cancer Neg Hx   . Rectal cancer Neg Hx   . Stomach cancer Neg Hx     SOCIAL HISTORY: Social History    Socioeconomic History  . Marital status: Single    Spouse name: Not on file  . Number of children: 1  . Years of education: 74  . Highest education level: Not on file  Occupational History  . Occupation: Company secretary  Tobacco Use  . Smoking status: Former Smoker    Packs/day: 0.25    Years: 17.00    Pack years: 4.25    Types: Cigarettes    Quit date: 03/02/2013    Years since quitting: 6.7  . Smokeless tobacco: Never Used  Substance and Sexual Activity  . Alcohol use: No    Alcohol/week: 0.0 standard drinks  . Drug use: Not Currently    Types: Marijuana    Comment: former - 6 yrs ago  . Sexual activity: Not on file  Other Topics Concern  . Not on file  Social History Narrative   She lives w/ her fiance'. She has one son.    Highest level of education:  12th grade, did not graduate. Currently back in school.   Right-handed   Caffeine: 1 cup of coffee some mornings      Social History      Diet? No       Do you drink/eat things with caffeine? yes      Marital status?           Single                          What year were you married?       Do you live in a house, apartment, assisted living, condo, trailer, etc.? house      Is it one or more stories?     How many persons live in your home?  2      Do you have any pets in your home? (please list)   No       Highest level of education completed? Some college       Current or past profession:       Do you exercise?       Yes                                Type & how often? Daily       Advanced Directives      Do you have a living will? No       Do you have a DNR form?                                  If not, do you want to discuss one? No       Do you have signed POA/HPOA for forms?  No       Functional Status      Do you have difficulty bathing or dressing yourself? No       Do you have difficulty preparing food or eating? No       Do you have difficulty managing your medications? No  Do you have  difficulty managing your finances? No       Do you have difficulty affording your medications? No       Social Determinants of Health   Financial Resource Strain:   . Difficulty of Paying Living Expenses: Not on file  Food Insecurity:   . Worried About Charity fundraiser in the Last Year: Not on file  . Ran Out of Food in the Last Year: Not on file  Transportation Needs:   . Lack of Transportation (Medical): Not on file  . Lack of Transportation (Non-Medical): Not on file  Physical Activity:   . Days of Exercise per Week: Not on file  . Minutes of Exercise per Session: Not on file  Stress:   . Feeling of Stress : Not on file  Social Connections:   . Frequency of Communication with Friends and Family: Not on file  . Frequency of Social Gatherings with Friends and Family: Not on file  . Attends Religious Services: Not on file  . Active Member of Clubs or Organizations: Not on file  . Attends Archivist Meetings: Not on file  . Marital Status: Not on file  Intimate Partner Violence:   . Fear of Current or Ex-Partner: Not on file  . Emotionally Abused: Not on file  . Physically Abused: Not on file  . Sexually Abused: Not on file   PHYSICAL EXAM  Vitals:   12/12/19 1453  BP: (!) 141/95  Pulse: 78  Temp: 97.7 F (36.5 C)  TempSrc: Oral  Weight: (!) 315 lb 6.4 oz (143.1 kg)  Height: 6' (1.829 m)   Body mass index is 42.78 kg/m.  Generalized: Well developed, in no acute distress   Neurological examination  Mentation: Alert oriented to time, place, history taking. Follows all commands speech and language fluent Cranial nerve II-XII: Pupils were equal round reactive to light. Extraocular movements were full, visual field were full on confrontational test. Facial sensation and strength were normal.  Head turning and shoulder shrug  were normal and symmetric. Motor: The motor testing reveals 5 over 5 strength of all 4 extremities. Good symmetric motor tone is noted  throughout.  Sensory: Sensory testing is intact to soft touch on all 4 extremities. No evidence of extinction is noted.  Coordination: Cerebellar testing reveals good finger-nose-finger and heel-to-shin bilaterally.  Gait and station: Gait is normal. Tandem gait is normal. Romberg is negative. No drift is seen.  Reflexes: Deep tendon reflexes are symmetric and normal bilaterally.   DIAGNOSTIC DATA (LABS, IMAGING, TESTING) - I reviewed patient records, labs, notes, testing and imaging myself where available.  Lab Results  Component Value Date   WBC 6.5 09/14/2019   HGB 13.7 09/14/2019   HCT 42.5 09/14/2019   MCV 87.3 09/14/2019   PLT 252 09/14/2019      Component Value Date/Time   NA 137 09/14/2019 1718   NA 144 09/25/2018 1317   K 3.3 (L) 09/14/2019 1718   CL 99 09/14/2019 1718   CO2 27 09/14/2019 1718   GLUCOSE 134 (H) 09/14/2019 1718   BUN 13 09/14/2019 1718   BUN 15 09/25/2018 1317   CREATININE 1.17 (H) 09/14/2019 1718   CREATININE 0.96 08/22/2019 1540   CALCIUM 9.3 09/14/2019 1718   PROT 6.8 08/22/2019 1540   PROT 7.0 11/24/2017 1629   ALBUMIN 3.8 06/25/2019 1109   ALBUMIN 4.3 11/24/2017 1629   AST 13 08/22/2019 1540   ALT 10 08/22/2019 1540   ALKPHOS  50 06/25/2019 1109   BILITOT 0.2 08/22/2019 1540   BILITOT <0.2 11/24/2017 1629   GFRNONAA 56 (L) 09/14/2019 1718   GFRNONAA 71 08/22/2019 1540   GFRAA >60 09/14/2019 1718   GFRAA 83 08/22/2019 1540   Lab Results  Component Value Date   CHOL 192 08/22/2019   HDL 53 08/22/2019   LDLCALC 118 (H) 08/22/2019   TRIG 105 08/22/2019   CHOLHDL 3.6 08/22/2019   Lab Results  Component Value Date   HGBA1C 6.1 (H) 08/22/2019   Lab Results  Component Value Date   [id number] 06/27/2018   Lab Results  Component Value Date   TSH 1.21 08/22/2019    ASSESSMENT AND PLAN 44 y.o. year old female  has a past medical history of Acute low back pain without sciatica (06/22/2018), ADHD, Allergy, Anemia, Anxiety, Arthritis,  Arthritis of ankle joint (11/10/2016), Asthma, Blood transfusion without reported diagnosis, Chest wall pain (05/26/2018), Chronic kidney disease, Chronic low back pain with sciatica (09/15/2018), Common migraine with intractable migraine (10/07/2016), Cough variant asthma vs UACS  (10/23/2016), Daytime sleepiness (05/26/2018), Decreased pedal pulses (10/04/2018), Depression, Depression, major, single episode, moderate ([place]) (05/26/2018), Diabetes ([place]), Dyspnea (04/15/2016), Edema, Essential hypertension, benign (11/15/2016), Fibroids (03/11/2016), Frequency of urination (09/11/2018), GERD (gastroesophageal reflux disease), Herniated lumbar intervertebral disc, Hiatal hernia, Hilar adenopathy, Hypertension, Hypokalemia (06/28/2018), Impaired fasting blood sugar (09/11/2018), Iron deficiency, Leg pain (12/07/2018), Low libido (11/27/2018), Migraine, Mild sleep apnea (08/03/2018), Morbid (severe) obesity due to excess calories ([place]) (10/23/2016), Personal history of sarcoidosis (05/26/2018), Prolonged capillary refill time, Right leg swelling (08/18/2018), Sarcoidosis, Seizures ([place]), Sleep apnea, Spondylosis, and Vitamin D deficiency. here with:  1.  Chronic daily headache 2.  Pseudotumor cerebri, LP x 2 opening pressure was 27 3.  Seizure 4.  Episodic visual loss  She last had a lumbar puncture 11/13/2019 with an opening pressure of 27, she had prior LP 10/18/2019 with opening pressure 27.  She continues to complain of chronic daily headache, pressure, episodic visual loss every few days and blurry vision.  I will increase her Diamox to 500 mg 3 times a day (as per Dr. Tobey Grim note).  She will remain on Keppra and nortriptyline.  I will also refer her to an ophthalmologist for evaluation.  If her headaches do not improve, she is to contact our office.  I will see her back in the office in 3 months or sooner if needed.  I did advise her symptoms worsen or she develops any new symptoms she should let us know.  I spent 15  minutes with the patient. 50% of this time was spent discussing her plan of care.  Butler Denmark, AGNP-C, DNP 12/12/2019, 3:16 PM [facility] [address], [place], [place] [address] [phone]

## 2019-12-12 NOTE — Patient Instructions (Addendum)
Increase the Diamox to 500 mg three times a day   I will refer you to the eye doctor to evaluation  If your headaches, don't improve with medication adjustment, let me know next week    Return in 3 months or sooner if needed

## 2019-12-13 ENCOUNTER — Encounter: Payer: Self-pay | Admitting: Neurology

## 2019-12-13 NOTE — Progress Notes (Signed)
I have read the note, and I agree with the clinical assessment and plan.  Victoria Moore   

## 2019-12-17 ENCOUNTER — Telehealth: Payer: Self-pay | Admitting: Neurology

## 2019-12-17 MED ORDER — NORTRIPTYLINE HCL 25 MG PO CAPS: ORAL_CAPSULE | ORAL | 1 refills | Status: DC

## 2019-12-17 NOTE — Telephone Encounter (Signed)
I called the patient in response to her MyChart message.  She will continue taking Diamox 500 mg 3 times a day, was just increased on 12/12/2019. We need to get her in for an eye evaluation, will route to the nurse to see if she can work with the referrals team here.  I will increase her nortriptyline to 50 mg at bedtime for headaches. She will call for dose adjustment if needed

## 2019-12-18 NOTE — Telephone Encounter (Signed)
Referral made to [facility] for Pseudotumor cerebri.   Fax confirmation received [phone].  ofv [phone].  They do accept [facility] .

## 2019-12-18 NOTE — Telephone Encounter (Signed)
I called pt and relayed that did fax over referral and notes.  I gave her # to call and make appt.  She verbalized understanding.

## 2019-12-19 ENCOUNTER — Ambulatory Visit: Payer: Self-pay | Admitting: Adult Health

## 2019-12-24 ENCOUNTER — Ambulatory Visit: Payer: Self-pay | Admitting: Adult Health

## 2019-12-28 HISTORY — PX: GASTRIC BYPASS: SHX52

## 2020-01-13 ENCOUNTER — Other Ambulatory Visit: Payer: Self-pay | Admitting: Family

## 2020-01-14 MED ORDER — ALBUTEROL SULFATE HFA 108 (90 BASE) MCG/ACT IN AERS: 1.0000 | INHALATION_SPRAY | RESPIRATORY_TRACT | 3 refills | Status: DC | PRN

## 2020-01-14 NOTE — Telephone Encounter (Signed)
Received a possible conflict warning when sending refill.

## 2020-01-14 NOTE — Addendum Note (Signed)
Addended by: Lorretta Harp on: 01/14/2020 09:18 AM   Modules accepted: Orders

## 2020-01-15 ENCOUNTER — Ambulatory Visit: Payer: PRIVATE HEALTH INSURANCE | Admitting: Physician Assistant

## 2020-01-15 ENCOUNTER — Other Ambulatory Visit: Payer: Self-pay | Admitting: Neurology

## 2020-01-15 NOTE — Progress Notes (Deleted)
Cardiology Office Note:    Date:  01/15/2020   ID:  Victoria Moore, DOB 01/31/1976, MRN [id number]  PCP:  Victoria Hughs, NP  Cardiologist:  No primary care provider on file. *** Electrophysiologist:  None   Referring MD: Ngetich, Nelda Bucks, NP   No chief complaint on file. ***  History of Present Illness:    Victoria Moore is a 44 y.o. female with:   Chest pain   Diabetes mellitus   Hypertension   Chronic kidney disease   Cough variant asthma   GERD  Sarcoidosis   OSA   Morbid Obesity   Followed by [facility]  [name] was evaluated by Victoria Moore in 09/2019 for chest pain and palpitations.  Her chest pain was felt to be MSK and she was given a trial of Meloxicam.  An event monitor was obtained and demonstrated no significant arrhythmia.     The DICTATELATER SmartLink is not supported in this context. ***   Prior CV studies:   The following studies were reviewed today:  *** Event monitor 11/2019  Normal sinus rhythm  There were frequent episodes of lead loss and no data is available.  No significant arrhythmias seen  ABIs 10/13/2018  Summary: Right: Resting right ankle-brachial index is within normal range. No evidence of significant right lower extremity arterial disease. The right toe-brachial index is abnormal.  Left: Resting left ankle-brachial index is within normal range. No evidence of significant left lower extremity arterial disease. The left toe-brachial index is abnormal.  Past Medical History:  Diagnosis Date  . Acute low back pain without sciatica 06/22/2018  . ADHD   . Allergy   . Anemia   . Anxiety   . Arthritis   . 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 Fibromyalgia, doubt sarcoid   . Asthma   . Blood transfusion without reported diagnosis   . Chest wall pain 05/26/2018  . Chronic kidney disease   . Chronic low back pain with sciatica 09/15/2018  . Common migraine  with intractable migraine 10/07/2016  . Cough variant asthma vs UACS  10/23/2016   - Spirometry 04/15/2016  Very truncated exp loop effort dep portion only  - Allergy profile 10/22/2016 >  Eos 0.2/  IgE  52 RAST POS grass/trees/ragweed  10/22/2016  After extensive coaching HFA effectiveness =    75% try duelra 100 2bid > improved  . Daytime sleepiness 05/26/2018  . Decreased pedal pulses 10/04/2018  . Depression   . Depression, major, single episode, moderate ([place]) 05/26/2018  . Diabetes ([place])   . Dyspnea 04/15/2016   04/15/2016  Walked RA x 3 laps @ 185 ft each stopped due to  End of study, nl pace, no sob or desat    - Spirometry 04/15/2016  Very truncated exp loop effort dep portion only  - 10/22/2016  Walked RA x 3 laps @ 185 ft each stopped due to  End of study, slow pace, min sob/ no desat - full pfts rec 10/22/2016 >>>      . Edema   . Essential hypertension, benign 11/15/2016  . Fibroids 03/11/2016  . Frequency of urination 09/11/2018  . GERD (gastroesophageal reflux disease)   . Herniated lumbar intervertebral disc   . Hiatal hernia   . Hilar adenopathy   . Hypertension   . Hypokalemia 06/28/2018  . Impaired fasting blood sugar 09/11/2018  . Iron deficiency   . Leg pain  12/07/2018  . Low libido 11/27/2018  . Migraine   . Mild sleep apnea 08/03/2018   HST 06/20/18  AHI  8.1 / snoring with 02 nadir 80% >  08/03/2018 rec sleep medicine consultation   . Morbid (severe) obesity due to excess calories ([place]) 10/23/2016  . Personal history of sarcoidosis 05/26/2018  . Prolonged capillary refill time   . Right leg swelling 08/18/2018  . Sarcoidosis    personal history of  . Seizures ([place])    history of   . Sleep apnea   . Spondylosis   . Vitamin D deficiency    Surgical Hx: The patient  has a past surgical history that includes Tubal ligation; Laparoscopic vaginal hysterectomy with salpingectomy (Bilateral, 03/11/2016); Laparoscopic ovarian cystectomy (Left, 03/11/2016); Abdominal hysterectomy;  Esophageal manometry (N/A, 09/05/2019); PH impedance study (09/05/2019); and Upper gastrointestinal endoscopy (10/11/2019).   Current Medications: No outpatient medications have been marked as taking for the 01/15/20 encounter (Appointment) with Victoria Moore.   Current Facility-Administered Medications for the 01/15/20 encounter (Appointment) with Victoria Moore  Medication  . nitroGLYCERIN (NITROSTAT) SL tablet 0.4 mg     Allergies:   Aspirin   Social History   Tobacco Use  . Smoking status: Former Smoker    Packs/day: 0.25    Years: 17.00    Pack years: 4.25    Types: Cigarettes    Quit date: 03/02/2013    Years since quitting: 6.8  . Smokeless tobacco: Never Used  Substance Use Topics  . Alcohol use: No    Alcohol/week: 0.0 standard drinks  . Drug use: Not Currently    Types: Marijuana    Comment: former - 6 yrs ago     Family Hx: The patient's family history includes Asthma in her mother; COPD in her father and mother; Diabetes in her father; Heart Problems in her father and mother; Heart attack in her father and mother; Migraines in her mother; Other in her son; Peptic Ulcer in her father. There is no history of Colon cancer, Colon polyps, Esophageal cancer, Rectal cancer, or Stomach cancer. She was adopted.  ROS:   Please see the history of present illness.    ROS All other systems reviewed and are negative.   EKGs/Labs/Other Test Reviewed:    EKG:  EKG is *** ordered today.  The ekg ordered today demonstrates ***  Recent Labs: 08/22/2019: ALT 10; TSH 1.21 09/14/2019: BUN 13; Creatinine, Ser 1.17; Hemoglobin 13.7; Platelets 252; Potassium 3.3; Sodium 137   Recent Lipid Panel Lab Results  Component Value Date/Time   CHOL 192 08/22/2019 03:40 PM   TRIG 105 08/22/2019 03:40 PM   HDL 53 08/22/2019 03:40 PM   CHOLHDL 3.6 08/22/2019 03:40 PM   LDLCALC 118 (H) 08/22/2019 03:40 PM    Physical Exam:    VS:  LMP 02/19/2016     Wt Readings from Last 3  Encounters:  12/12/19 (!) 315 lb 6.4 oz (143.1 kg)  11/07/19 (!) 313 lb 12.8 oz (142.3 kg)  11/05/19 (!) 302 lb (137 kg)     ***Physical Exam  ASSESSMENT & PLAN:    ***  Dispo:  No follow-ups on file.   Medication Adjustments/Labs and Tests Ordered: Current medicines are reviewed at length with the patient today.  Concerns regarding medicines are outlined above.  Tests Ordered: No orders of the defined types were placed in this encounter.  Medication Changes: No orders of the defined types were placed in this encounter.   Signed,  Victoria Dopp, Moore  01/15/2020 11:15 AM    [address], [place], [place]  [address] Phone: [phone]; Fax: [phone]

## 2020-01-24 DIAGNOSIS — G4726 Circadian rhythm sleep disorder, shift work type: Secondary | ICD-10-CM | POA: Insufficient documentation

## 2020-01-27 ENCOUNTER — Emergency Department (HOSPITAL_COMMUNITY)
Admission: EM | Admit: 2020-01-27 | Discharge: 2020-01-27 | Disposition: A | Discharge status: Home / Self Care | Payer: 59 | Attending: Emergency Medicine | Admitting: Emergency Medicine

## 2020-01-27 ENCOUNTER — Encounter (HOSPITAL_COMMUNITY): Payer: Self-pay | Admitting: Emergency Medicine

## 2020-01-27 ENCOUNTER — Other Ambulatory Visit: Payer: Self-pay

## 2020-01-27 ENCOUNTER — Emergency Department (HOSPITAL_COMMUNITY): Payer: 59

## 2020-01-27 DIAGNOSIS — N189 Chronic kidney disease, unspecified: Secondary | ICD-10-CM | POA: Insufficient documentation

## 2020-01-27 DIAGNOSIS — R519 Headache, unspecified: Secondary | ICD-10-CM | POA: Diagnosis present

## 2020-01-27 DIAGNOSIS — I129 Hypertensive chronic kidney disease with stage 1 through stage 4 chronic kidney disease, or unspecified chronic kidney disease: Secondary | ICD-10-CM | POA: Insufficient documentation

## 2020-01-27 DIAGNOSIS — Z87891 Personal history of nicotine dependence: Secondary | ICD-10-CM | POA: Insufficient documentation

## 2020-01-27 DIAGNOSIS — Z7984 Long term (current) use of oral hypoglycemic drugs: Secondary | ICD-10-CM | POA: Insufficient documentation

## 2020-01-27 DIAGNOSIS — Z79899 Other long term (current) drug therapy: Secondary | ICD-10-CM | POA: Diagnosis not present

## 2020-01-27 DIAGNOSIS — E1122 Type 2 diabetes mellitus with diabetic chronic kidney disease: Secondary | ICD-10-CM | POA: Diagnosis not present

## 2020-01-27 MED ORDER — SODIUM CHLORIDE 0.9 % IV BOLUS
1000.0000 mL | Freq: Once | INTRAVENOUS | Status: AC
  Administered 2020-01-27: 1000 mL via INTRAVENOUS

## 2020-01-27 MED ORDER — DEXAMETHASONE SODIUM PHOSPHATE 10 MG/ML IJ SOLN
10.0000 mg | Freq: Once | INTRAMUSCULAR | Status: AC
  Administered 2020-01-27: 10 mg via INTRAVENOUS
  Filled 2020-01-27: qty 1

## 2020-01-27 MED ORDER — METOCLOPRAMIDE HCL 5 MG/ML IJ SOLN
10.0000 mg | Freq: Once | INTRAMUSCULAR | Status: AC
  Administered 2020-01-27: 10:00:00 10 mg via INTRAVENOUS
  Filled 2020-01-27: qty 2

## 2020-01-27 MED ORDER — KETOROLAC TROMETHAMINE 30 MG/ML IJ SOLN
30.0000 mg | Freq: Once | INTRAMUSCULAR | Status: AC
  Administered 2020-01-27: 30 mg via INTRAVENOUS
  Filled 2020-01-27: qty 1

## 2020-01-27 MED ORDER — DIPHENHYDRAMINE HCL 50 MG/ML IJ SOLN
25.0000 mg | Freq: Once | INTRAMUSCULAR | Status: AC
  Administered 2020-01-27: 25 mg via INTRAVENOUS
  Filled 2020-01-27: qty 1

## 2020-01-27 NOTE — Discharge Instructions (Signed)
Continue to take all of your home medications as prescribed.  Drink plenty of fluids and get rest.  Call your neurologist tomorrow to schedule follow-up appointment and for further recommendations.  They may want to adjust your medications.  Return to the emergency department if any concerning signs or symptoms develop such as high fevers, neck stiffness, persistent vomiting, vision changes, weakness to 1 side of the body, or loss of consciousness.

## 2020-01-27 NOTE — ED Triage Notes (Signed)
Patient here from home with complaints of headache that started this morning. Hx of same. Reports taking "headache meds" with no relief.

## 2020-01-27 NOTE — ED Notes (Signed)
Pt provided cup of water, apple juice, [place] sandwich.

## 2020-01-27 NOTE — ED Provider Notes (Signed)
[facility] DEPT Provider Note   CSN: [id number] Arrival date & time: 01/27/20  [id number]     History Chief Complaint  Patient presents with  . Migraine    Victoria Moore is a 44 y.o. female with past medical history as detailed below presents for evaluation of gradual onset, persistent right-sided headache.  She reports that she awoke at 3 AM this morning with sharp aching right headache which she states is a little different from her usual migraine headache.  She went to bed in her usual state of health.  She notes photophobia and phonophobia which is typical of her usual migraine headaches but states that the headache is different because it radiates into the right side of the neck, worsens with looking up and she notes a "buzzing" sound in her right ear.  Also notes some nausea but no vomiting which she states is a little unusual for her typical migraine headaches.  She denies recent travel, fevers, neck stiffness, IV drug use, numbness or weakness of the extremities, or vision changes.  Feels lightheaded with position changes but no syncope. She reports feeling very fatigued and having dry mouth which she sometimes feels after having a seizure but there was no witnessed seizure this morning.  She has been taking all of her medications including her antiepileptics as prescribed.  She denies excessive alcohol use or recreational drug use.  She has taken 2 tablets of Imitrex without relief of her symptoms.  She has come to the ED for intractable migraines previously.  She is followed by [facility] at this time for management of her migraine headaches.  The history is provided by the patient.       Past Medical History:  Diagnosis Date  . Acute low back pain without sciatica 06/22/2018  . ADHD   . Allergy   . Anemia   . Anxiety   . Arthritis   . 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 Fibromyalgia, doubt  sarcoid   . Asthma   . Blood transfusion without reported diagnosis   . Chest wall pain 05/26/2018  . Chronic kidney disease   . Chronic low back pain with sciatica 09/15/2018  . Common migraine with intractable migraine 10/07/2016  . Cough variant asthma vs UACS  10/23/2016   - Spirometry 04/15/2016  Very truncated exp loop effort dep portion only  - Allergy profile 10/22/2016 >  Eos 0.2/  IgE  52 RAST POS grass/trees/ragweed  10/22/2016  After extensive coaching HFA effectiveness =    75% try duelra 100 2bid > improved  . Daytime sleepiness 05/26/2018  . Decreased pedal pulses 10/04/2018  . Depression   . Depression, major, single episode, moderate ([place]) 05/26/2018  . Diabetes ([place])   . Dyspnea 04/15/2016   04/15/2016  Walked RA x 3 laps @ 185 ft each stopped due to  End of study, nl pace, no sob or desat    - Spirometry 04/15/2016  Very truncated exp loop effort dep portion only  - 10/22/2016  Walked RA x 3 laps @ 185 ft each stopped due to  End of study, slow pace, min sob/ no desat - full pfts rec 10/22/2016 >>>      . Edema   . Essential hypertension, benign 11/15/2016  . Fibroids 03/11/2016  . Frequency of urination 09/11/2018  . GERD (gastroesophageal reflux disease)   . Herniated lumbar intervertebral disc   . Hiatal  hernia   . Hilar adenopathy   . Hypertension   . Hypokalemia 06/28/2018  . Impaired fasting blood sugar 09/11/2018  . Iron deficiency   . Leg pain 12/07/2018  . Low libido 11/27/2018  . Migraine   . Mild sleep apnea 08/03/2018   HST 06/20/18  AHI  8.1 / snoring with 02 nadir 80% >  08/03/2018 rec sleep medicine consultation   . Morbid (severe) obesity due to excess calories ([place]) 10/23/2016  . Personal history of sarcoidosis 05/26/2018  . Prolonged capillary refill time   . Right leg swelling 08/18/2018  . Sarcoidosis    personal history of  . Seizures ([place])    history of   . Sleep apnea   . Spondylosis   . Vitamin D deficiency     Patient Active Problem List    Diagnosis Date Noted  . Other intervertebral disc degeneration, lumbar region 09/19/2019  . Complex cyst of left ovary 02/01/2019  . Sarcoidosis 02/01/2019  . Leg pain 12/07/2018  . Low libido 11/27/2018  . Prolonged capillary refill time 10/04/2018  . Decreased pedal pulses 10/04/2018  . Spondylosis 09/15/2018  . Chronic low back pain with sciatica 09/15/2018  . Impaired fasting blood sugar 09/11/2018  . Weight gain 09/11/2018  . Mild sleep apnea 08/03/2018  . Hypokalemia 06/28/2018  . Medication side effect 06/22/2018  . Acute low back pain without sciatica 06/22/2018  . Personal history of sarcoidosis 05/26/2018  . Vitamin D deficiency 05/26/2018  . Encounter for health maintenance examination with abnormal findings 05/26/2018  . Depression, major, single episode, moderate ([place]) 05/26/2018  . Daytime sleepiness 05/26/2018  . Chest pain of uncertain etiology [id number]  . GERD (gastroesophageal reflux disease) 05/26/2018  . Essential hypertension, benign 11/15/2016  . 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 Morbid (severe) obesity due to excess calories ([place]) 10/23/2016  . Common migraine with intractable migraine 10/07/2016  . Dyspnea 04/15/2016  . Hilar adenopathy 04/15/2016  . Seizures ([place]) 03/11/2016  . Anemia 11/12/2014  . Iron deficiency 11/12/2014    Past Surgical History:  Procedure Laterality Date  . ABDOMINAL HYSTERECTOMY    . ESOPHAGEAL MANOMETRY N/A 09/05/2019   Procedure: ESOPHAGEAL MANOMETRY (EM);  Surgeon: Lavena Bullion, DO;  Location: [facility];  Service: Gastroenterology;  Laterality: N/A;  . LAPAROSCOPIC OVARIAN CYSTECTOMY Left 03/11/2016   Procedure: LAPAROSCOPIC OVARIAN CYSTECTOMY;  Surgeon: Eldred Manges, MD;  Location: [facility];  Service: Gynecology;  Laterality: Left;  . LAPAROSCOPIC VAGINAL HYSTERECTOMY WITH SALPINGECTOMY Bilateral 03/11/2016   Procedure: LAPAROSCOPIC ASSISTED VAGINAL HYSTERECTOMY WITH SALPINGECTOMY;  Surgeon: Eldred Manges, MD;  Location: [facility];  Service: Gynecology;  Laterality: Bilateral;  . [place] IMPEDANCE STUDY  09/05/2019   Procedure: [place] IMPEDANCE STUDY;  Surgeon: Lavena Bullion, DO;  Location: [facility];  Service: Gastroenterology;;  . TUBAL LIGATION    . UPPER GASTROINTESTINAL ENDOSCOPY  10/11/2019   06/2019     OB History    Gravida  1   Para      Term      Preterm      AB      Living        SAB      TAB      Ectopic      Multiple      Live Births              Family History  Adopted: Yes  Problem Relation Age of Onset  . Other Son  Growing pains  . Asthma Mother   . Heart Problems Mother   . Migraines Mother   . COPD Mother   . Heart attack Mother   . Diabetes Father   . Peptic Ulcer Father   . Heart Problems Father   . COPD Father   . Heart attack Father   . Colon cancer Neg Hx   . Colon polyps Neg Hx   . Esophageal cancer Neg Hx   . Rectal cancer Neg Hx   . Stomach cancer Neg Hx     Social History   Tobacco Use  . Smoking status: Former Smoker    Packs/day: 0.25    Years: 17.00    Pack years: 4.25    Types: Cigarettes    Quit date: 03/02/2013    Years since quitting: 6.9  . Smokeless tobacco: Never Used  Substance Use Topics  . Alcohol use: No    Alcohol/week: 0.0 standard drinks  . Drug use: Not Currently    Types: Marijuana    Comment: former - 6 yrs ago    Home Medications Prior to Admission medications   Medication Sig Start Date End Date Taking? Authorizing Provider  albuterol (PROVENTIL) (2.5 MG/3ML) 0.083% nebulizer solution Take 2.5 mg by nebulization every 6 (six) hours as needed for wheezing or shortness of breath.   Yes [provider]  albuterol (VENTOLIN HFA) 108 (90 Base) MCG/ACT inhaler Inhale 1-2 puffs into the lungs every 4 (four) hours as needed for wheezing or shortness of breath. 01/14/20  Yes Parrett, Fonnie Mu, NP  ALPRAZolam (XANAX) 1 MG tablet Take 0.5-1 tablets (0.5-1 mg total) by mouth at bedtime  as needed for anxiety. 12/05/19  Yes Ngetich, Dinah C, NP  amphetamine-dextroamphetamine (ADDERALL XR) 30 MG 24 hr capsule Take 1 capsule (30 mg total) by mouth daily as needed (ADHD). 11/02/19  Yes Ngetich, Dinah C, NP  chlorthalidone (HYGROTON) 25 MG tablet TAKE 1 TABLET BY MOUTH EVERY DAY Patient taking differently: Take 25 mg by mouth daily.  11/05/19  Yes Ngetich, Dinah C, NP  citalopram (CELEXA) 20 MG tablet TAKE 1 TABLET BY MOUTH EVERY DAY Patient taking differently: Take 20 mg by mouth daily.  01/14/20  Yes Ngetich, Dinah C, NP  famotidine (PEPCID) 40 MG tablet Take 1 tablet (40 mg total) by mouth 2 (two) times daily. 09/20/19  Yes Cirigliano, Vito V, DO  ferrous sulfate 325 (65 FE) MG tablet Take 325 mg by mouth daily with breakfast.   Yes [provider]  Fluticasone-Salmeterol (ADVAIR DISKUS) 100-50 MCG/DOSE AEPB Inhale 1 puff into the lungs 2 (two) times daily. 11/20/19  Yes Parrett, Tammy S, NP  hyoscyamine (LEVSIN SL) 0.125 MG SL tablet Place 1 tablet (0.125 mg total) under the tongue every 6 (six) hours as needed. Patient taking differently: Place 0.125 mg under the tongue every 6 (six) hours as needed for cramping.  11/05/19  Yes Cirigliano, Vito V, DO  levETIRAcetam (KEPPRA) 500 MG tablet 1/2 tablet twice daily for 1 week and then take 1 full tablet twice daily Patient taking differently: Take 500 mg by mouth 2 (two) times daily.  10/15/19  Yes Kathrynn Ducking, MD  meloxicam (MOBIC) 7.5 MG tablet Take 1 tablet (7.5 mg total) by mouth daily. 10/09/19  Yes Nahser, Wonda Cheng, MD  metFORMIN (GLUCOPHAGE) 500 MG tablet Take 500 mg by mouth daily.    Yes [provider]  mometasone-formoterol (DULERA) 100-5 MCG/ACT AERO Inhale 2 puffs into the lungs 2 (two) times  daily. 08/10/19  Yes Ngetich, Dinah C, NP  nortriptyline (PAMELOR) 25 MG capsule Take 2 capsules at bedtime. Patient taking differently: Take 50 mg by mouth at bedtime.  12/17/19  Yes Suzzanne Cloud, NP    spironolactone (ALDACTONE) 25 MG tablet TAKE 1 TABLET BY MOUTH EVERY DAY Patient taking differently: Take 25 mg by mouth daily.  11/05/19  Yes Ngetich, Dinah C, NP  sucralfate (CARAFATE) 1 g tablet Take 1 tablet (1 g total) by mouth 2 (two) times daily. 11/14/19  Yes Cirigliano, Vito V, DO  SUMAtriptan (IMITREX) 100 MG tablet TAKE 1 TABLET BY MOUTH 2X DAILY AS NEEDED FOR MIGRAINE. MAY REPEAT IN 2 HOURS IF HEADACHE PERSISTS OR RECURS. Patient taking differently: Take 100 mg by mouth every 2 (two) hours as needed for migraine. May repeat in 2 hours if headache persists or reoccurs 12/11/19  Yes Kathrynn Ducking, MD  acetaZOLAMIDE (DIAMOX) 500 MG capsule Take 1 capsule (500 mg total) by mouth 3 (three) times daily. Patient not taking: Reported on 01/27/2020 12/12/19   Suzzanne Cloud, NP  meclizine (ANTIVERT) 25 MG tablet Take 1 tablet (25 mg total) by mouth 3 (three) times daily as needed for dizziness. Patient not taking: Reported on 01/27/2020 12/05/19   Ngetich, Dinah C, NP    Allergies    Aspirin  Review of Systems   Review of Systems  Constitutional: Negative for chills and fever.  HENT: Negative for congestion, ear discharge and sore throat.   Eyes: Positive for photophobia. Negative for visual disturbance.  Respiratory: Negative for shortness of breath.   Cardiovascular: Negative for chest pain.  Gastrointestinal: Positive for nausea. Negative for abdominal pain and vomiting.  Musculoskeletal: Positive for neck pain. Negative for neck stiffness.  Neurological: Positive for light-headedness. Negative for weakness and numbness.  All other systems reviewed and are negative.   Physical Exam Updated Vital Signs BP 116/84   Pulse 77   Temp 99.1 F (37.3 C) (Oral)   Resp 16   LMP 02/19/2016   SpO2 100%   Physical Exam Vitals and nursing note reviewed.  Constitutional:      General: She is not in acute distress.    Appearance: She is well-developed.     Comments: Appears  uncomfortable, sitting in bed with sunglasses on  HENT:     Head: Normocephalic and atraumatic.  Eyes:     General:        Right eye: No discharge.        Left eye: No discharge.     Extraocular Movements: Extraocular movements intact.     Conjunctiva/sclera: Conjunctivae normal.     Pupils: Pupils are equal, round, and reactive to light.  Neck:     Vascular: No JVD.     Trachea: No tracheal deviation.     Comments: No midline cervical spine tenderness.  No deformity, crepitus or step-off noted.  Tenderness noted on palpation along right paracervical muscles. No neck stiffness.  Cardiovascular:     Rate and Rhythm: Normal rate and regular rhythm.     Pulses: Normal pulses.  Pulmonary:     Effort: Pulmonary effort is normal.     Breath sounds: Normal breath sounds.  Abdominal:     General: There is no distension.     Palpations: Abdomen is soft.  Musculoskeletal:     Cervical back: Normal range of motion and neck supple. No rigidity.  Skin:    General: Skin is warm and dry.  Findings: No erythema.  Neurological:     Mental Status: She is alert.     Comments: Mental Status:  Alert, thought content appropriate, able to give a coherent history. Speech fluent without evidence of aphasia. Able to follow 2 step commands without difficulty.  Cranial Nerves:  II:  Peripheral visual fields grossly normal, pupils equal, round, reactive to light III,IV, VI: ptosis not present, extra-ocular motions intact bilaterally  V,VII: smile symmetric, facial light touch sensation equal VIII: hearing grossly normal to voice  X: uvula elevates symmetrically  XI: bilateral shoulder shrug symmetric and strong XII: midline tongue extension without fassiculations Motor:  Normal tone. 5/5 strength of BUE and BLE major muscle groups including strong and equal grip strength and dorsiflexion/plantar flexion Sensory: light touch normal in all extremities. Cerebellar: normal finger-to-nose with bilateral  upper extremities, Romberg sign absent Gait: normal gait and balance. Able to walk on toes and heels with ease.   Psychiatric:        Behavior: Behavior normal.     ED Results / Procedures / Treatments   Labs (all labs ordered are listed, but only abnormal results are displayed) Labs Reviewed - No data to display  EKG None  Radiology CT Head Wo Contrast  Result Date: 01/27/2020 CLINICAL DATA:  Acute headache with light sensitivity. EXAM: CT HEAD WITHOUT CONTRAST TECHNIQUE: Contiguous axial images were obtained from the base of the skull through the vertex without intravenous contrast. COMPARISON:  09/15/2019 FINDINGS: Brain: No evidence of acute infarction, hemorrhage, hydrocephalus, extra-axial collection or mass lesion/mass effect. Vascular: No hyperdense vessel or unexpected calcification. Skull: Normal. Negative for fracture or focal lesion. Sinuses/Orbits: No acute finding. Other: None. IMPRESSION: Normal head CT. Electronically Signed   By: Marin Olp M.D.   On: 01/27/2020 10:05    Procedures Procedures (including critical care time)  Medications Ordered in ED Medications  metoCLOPramide (REGLAN) injection 10 mg (10 mg Intravenous Given 01/27/20 0948)  diphenhydrAMINE (BENADRYL) injection 25 mg (25 mg Intravenous Given 01/27/20 0948)  sodium chloride 0.9 % bolus 1,000 mL (0 mLs Intravenous Stopped 01/27/20 1150)  ketorolac (TORADOL) 30 MG/ML injection 30 mg (30 mg Intravenous Given 01/27/20 1149)  dexamethasone (DECADRON) injection 10 mg (10 mg Intravenous Given 01/27/20 1148)    ED Course  I have reviewed the triage vital signs and the nursing notes.  Pertinent labs & imaging results that were available during my care of the patient were reviewed by me and considered in my medical decision making (see chart for details).    MDM Rules/Calculators/A&P                      Patient presenting for evaluation of right-sided headache.  Reports several features are consistent  with her usual migraine headaches but some are little bit different.  Has a history of intractable migraines previously requiring visit to the ED for treatment.  She is afebrile, vital signs are stable.  She is nontoxic in appearance.  She has a completely normal neurologic examination.  No nuchal rigidity, fever or meningeal signs to suggest meningitis.  Doubt SAH, ICH, temporal arteritis, or CVA.  However given the headache is little different from her typical migraine headache we will obtain head imaging to rule out acute abnormality.  Head imaging shows no acute intracranial abnormalities.  She was given IV fluids, Toradol, Decadron, Benadryl, and Reglan and on reevaluation reports that she is feeling much better.  She is tolerating p.o. food and fluids without difficulty.  Reports her headache improved from 10/10 in severity to 5/10 in severity.  She is ambulatory in the ED without difficulty.  She will call her neurologist tomorrow to schedule follow-up and discuss possible medication management.  Discussed strict ED return precautions. Patient verbalized understanding of and agreement with plan and is safe for discharge home at this time.  Final Clinical Impression(s) / ED Diagnoses Final diagnoses:  Bad headache    Rx / DC Orders ED Discharge Orders    None       Debroah Baller 01/27/20 1230    Daleen Bo, MD 01/27/20 1539

## 2020-01-28 ENCOUNTER — Telehealth: Payer: Self-pay | Admitting: Neurology

## 2020-01-28 ENCOUNTER — Encounter: Payer: Self-pay | Admitting: Neurology

## 2020-01-28 ENCOUNTER — Telehealth: Payer: Self-pay

## 2020-01-28 MED ORDER — ACETAZOLAMIDE ER 500 MG PO CP12: 1000.0000 mg | ORAL_CAPSULE | Freq: Two times a day (BID) | ORAL | 3 refills | Status: DC

## 2020-01-28 NOTE — Telephone Encounter (Signed)
I called the patient.  She did not answer.  She went to the ER yesterday for right-sided headache.,  According to the note, was different from her usual migraine, was sharp.  She had a buzzing sound in the right ear.  She took 2 Imitrex without relief.  CT head was done that did not show acute abnormality.  She was given IV fluids, Toradol, Decadron, Benadryl, Reglan, was feeling much better.  Please call her, when last seen, Diamox was increased to 500 mg 3 times a day, nortriptyline 50 mg at bedtime. She had lumbar puncture 11/13/2019 spinal fluid was 27, similar to previous. Call to check on patient.   I saw her 12/12/2019, she was to follow-up with Dr. Katy Fitch for evaluation of pseudotumor cerebri, check to see that she did this, this is important.   Please schedule follow-up appointment to be seen, with Dr. Jannifer Franklin if possible, if none available, ok to see me this week.

## 2020-01-28 NOTE — Telephone Encounter (Signed)
Spoke to patient this morning to remind her that her repeat labs were due this month per MD last office visit. Patient stated she will go by the [facility] office this week and havre her labs drawn.

## 2020-01-28 NOTE — Telephone Encounter (Signed)
I called the patient.  Patient got benefit from the spinal tap for 1 month without headache.  The headaches have returned, she went to the emergency room yesterday.  We will go up on the Diamox taking 1000 mg twice daily, the maximum dose is 4 g daily.

## 2020-01-28 NOTE — Addendum Note (Signed)
Addended by: Kathrynn Ducking on: 01/28/2020 01:06 PM   Modules accepted: Orders

## 2020-01-31 ENCOUNTER — Other Ambulatory Visit: Payer: Self-pay | Admitting: Internal Medicine

## 2020-01-31 DIAGNOSIS — G4459 Other complicated headache syndrome: Secondary | ICD-10-CM

## 2020-02-01 ENCOUNTER — Other Ambulatory Visit: Payer: Self-pay | Admitting: Student

## 2020-02-01 DIAGNOSIS — R0602 Shortness of breath: Secondary | ICD-10-CM

## 2020-02-01 DIAGNOSIS — Z01818 Encounter for other preprocedural examination: Secondary | ICD-10-CM

## 2020-02-01 DIAGNOSIS — I208 Other forms of angina pectoris: Secondary | ICD-10-CM

## 2020-02-04 ENCOUNTER — Other Ambulatory Visit: Payer: Self-pay

## 2020-02-04 ENCOUNTER — Emergency Department (HOSPITAL_COMMUNITY)
Admission: EM | Admit: 2020-02-04 | Discharge: 2020-02-05 | Disposition: A | Discharge status: Home / Self Care | Payer: 59 | Attending: Emergency Medicine | Admitting: Emergency Medicine

## 2020-02-04 ENCOUNTER — Encounter (HOSPITAL_COMMUNITY): Payer: Self-pay

## 2020-02-04 DIAGNOSIS — I129 Hypertensive chronic kidney disease with stage 1 through stage 4 chronic kidney disease, or unspecified chronic kidney disease: Secondary | ICD-10-CM | POA: Diagnosis not present

## 2020-02-04 DIAGNOSIS — G43809 Other migraine, not intractable, without status migrainosus: Secondary | ICD-10-CM | POA: Insufficient documentation

## 2020-02-04 DIAGNOSIS — E1122 Type 2 diabetes mellitus with diabetic chronic kidney disease: Secondary | ICD-10-CM | POA: Diagnosis not present

## 2020-02-04 DIAGNOSIS — Z7984 Long term (current) use of oral hypoglycemic drugs: Secondary | ICD-10-CM | POA: Diagnosis not present

## 2020-02-04 DIAGNOSIS — N189 Chronic kidney disease, unspecified: Secondary | ICD-10-CM | POA: Insufficient documentation

## 2020-02-04 DIAGNOSIS — Z79899 Other long term (current) drug therapy: Secondary | ICD-10-CM | POA: Diagnosis not present

## 2020-02-04 DIAGNOSIS — J45909 Unspecified asthma, uncomplicated: Secondary | ICD-10-CM | POA: Insufficient documentation

## 2020-02-04 DIAGNOSIS — L089 Local infection of the skin and subcutaneous tissue, unspecified: Secondary | ICD-10-CM | POA: Diagnosis not present

## 2020-02-04 DIAGNOSIS — M79662 Pain in left lower leg: Secondary | ICD-10-CM | POA: Diagnosis present

## 2020-02-04 MED ORDER — DROPERIDOL 2.5 MG/ML IJ SOLN
1.2500 mg | Freq: Once | INTRAMUSCULAR | Status: AC
  Administered 2020-02-04: 1.25 mg via INTRAVENOUS
  Filled 2020-02-04: qty 2

## 2020-02-04 MED ORDER — MUPIROCIN CALCIUM 2 % EX CREA
TOPICAL_CREAM | Freq: Two times a day (BID) | CUTANEOUS | Status: DC
  Administered 2020-02-04: 1 via TOPICAL
  Filled 2020-02-04: qty 15

## 2020-02-04 MED ORDER — DIPHENHYDRAMINE HCL 50 MG/ML IJ SOLN
25.0000 mg | Freq: Once | INTRAMUSCULAR | Status: AC
  Administered 2020-02-04: 25 mg via INTRAVENOUS
  Filled 2020-02-04: qty 1

## 2020-02-04 MED ORDER — SODIUM CHLORIDE 0.9 % IV BOLUS
1000.0000 mL | Freq: Once | INTRAVENOUS | Status: AC
  Administered 2020-02-04: 1000 mL via INTRAVENOUS

## 2020-02-04 MED ORDER — DEXAMETHASONE SODIUM PHOSPHATE 10 MG/ML IJ SOLN
10.0000 mg | Freq: Once | INTRAMUSCULAR | Status: AC
  Administered 2020-02-04: 10 mg via INTRAVENOUS
  Filled 2020-02-04: qty 1

## 2020-02-04 NOTE — ED Provider Notes (Signed)
[place] DEPT Provider Note   CSN: [id number] Arrival date & time: 02/04/20  [place]     History Chief Complaint  Patient presents with  . Leg Pain    Victoria Moore is a 44 y.o. female who presents emergency department with chief complaint of migraine headache and left leg pain.  Patient states that she has a history of migraine headaches.  She has Imitrex at home however it did not work.  She complains of bilateral headache worse on the right, photophobia, phonophobia, nausea typical of her headaches.  She rates her pain at 8 out of 10.  She denies neck stiffness, changes in vision, unilateral weakness, difficulty with speech or swallowing.  She denies sudden onset thunderclap headache. Secondly patient complains of a small, punctate red bump on her left lower leg which is very painful.  She is never had anything like it before.  She denies a history of MRSA.  HPI     Past Medical History:  Diagnosis Date  . Acute low back pain without sciatica 06/22/2018  . ADHD   . Allergy   . Anemia   . Anxiety   . Arthritis   . 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 Fibromyalgia, doubt sarcoid   . Asthma   . Blood transfusion without reported diagnosis   . Chest wall pain 05/26/2018  . Chronic kidney disease   . Chronic low back pain with sciatica 09/15/2018  . Common migraine with intractable migraine 10/07/2016  . Cough variant asthma vs UACS  10/23/2016   - Spirometry 04/15/2016  Very truncated exp loop effort dep portion only  - Allergy profile 10/22/2016 >  Eos 0.2/  IgE  52 RAST POS grass/trees/ragweed  10/22/2016  After extensive coaching HFA effectiveness =    75% try duelra 100 2bid > improved  . Daytime sleepiness 05/26/2018  . Decreased pedal pulses 10/04/2018  . Depression   . Depression, major, single episode, moderate ([place]) 05/26/2018  . Diabetes ([place])   . Dyspnea 04/15/2016   04/15/2016  Walked RA x  3 laps @ 185 ft each stopped due to  End of study, nl pace, no sob or desat    - Spirometry 04/15/2016  Very truncated exp loop effort dep portion only  - 10/22/2016  Walked RA x 3 laps @ 185 ft each stopped due to  End of study, slow pace, min sob/ no desat - full pfts rec 10/22/2016 >>>      . Edema   . Essential hypertension, benign 11/15/2016  . Fibroids 03/11/2016  . Frequency of urination 09/11/2018  . GERD (gastroesophageal reflux disease)   . Herniated lumbar intervertebral disc   . Hiatal hernia   . Hilar adenopathy   . Hypertension   . Hypokalemia 06/28/2018  . Impaired fasting blood sugar 09/11/2018  . Iron deficiency   . Leg pain 12/07/2018  . Low libido 11/27/2018  . Migraine   . Mild sleep apnea 08/03/2018   HST 06/20/18  AHI  8.1 / snoring with 02 nadir 80% >  08/03/2018 rec sleep medicine consultation   . Morbid (severe) obesity due to excess calories ([place]) 10/23/2016  . Personal history of sarcoidosis 05/26/2018  . Prolonged capillary refill time   . Right leg swelling 08/18/2018  . Sarcoidosis    personal history of  . Seizures ([place])    history of   . Sleep apnea   .  Spondylosis   . Vitamin D deficiency     Patient Active Problem List   Diagnosis Date Noted  . Other intervertebral disc degeneration, lumbar region 09/19/2019  . Complex cyst of left ovary 02/01/2019  . Sarcoidosis 02/01/2019  . Leg pain 12/07/2018  . Low libido 11/27/2018  . Prolonged capillary refill time 10/04/2018  . Decreased pedal pulses 10/04/2018  . Spondylosis 09/15/2018  . Chronic low back pain with sciatica 09/15/2018  . Impaired fasting blood sugar 09/11/2018  . Weight gain 09/11/2018  . Mild sleep apnea 08/03/2018  . Hypokalemia 06/28/2018  . Medication side effect 06/22/2018  . Acute low back pain without sciatica 06/22/2018  . Personal history of sarcoidosis 05/26/2018  . Vitamin D deficiency 05/26/2018  . Encounter for health maintenance examination with abnormal findings 05/26/2018    . Depression, major, single episode, moderate ([place]) 05/26/2018  . Daytime sleepiness 05/26/2018  . Chest pain of uncertain etiology [id number]  . GERD (gastroesophageal reflux disease) 05/26/2018  . Essential hypertension, benign 11/15/2016  . 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 Morbid (severe) obesity due to excess calories ([place]) 10/23/2016  . Common migraine with intractable migraine 10/07/2016  . Dyspnea 04/15/2016  . Hilar adenopathy 04/15/2016  . Seizures ([place]) 03/11/2016  . Anemia 11/12/2014  . Iron deficiency 11/12/2014    Past Surgical History:  Procedure Laterality Date  . ABDOMINAL HYSTERECTOMY    . ESOPHAGEAL MANOMETRY N/A 09/05/2019   Procedure: ESOPHAGEAL MANOMETRY (EM);  Surgeon: Lavena Bullion, DO;  Location: [facility];  Service: Gastroenterology;  Laterality: N/A;  . LAPAROSCOPIC OVARIAN CYSTECTOMY Left 03/11/2016   Procedure: LAPAROSCOPIC OVARIAN CYSTECTOMY;  Surgeon: Eldred Manges, MD;  Location: [facility];  Service: Gynecology;  Laterality: Left;  . LAPAROSCOPIC VAGINAL HYSTERECTOMY WITH SALPINGECTOMY Bilateral 03/11/2016   Procedure: LAPAROSCOPIC ASSISTED VAGINAL HYSTERECTOMY WITH SALPINGECTOMY;  Surgeon: Eldred Manges, MD;  Location: [facility];  Service: Gynecology;  Laterality: Bilateral;  . [place] IMPEDANCE STUDY  09/05/2019   Procedure: [place] IMPEDANCE STUDY;  Surgeon: Lavena Bullion, DO;  Location: [facility];  Service: Gastroenterology;;  . TUBAL LIGATION    . UPPER GASTROINTESTINAL ENDOSCOPY  10/11/2019   06/2019     OB History    Gravida  1   Para      Term      Preterm      AB      Living        SAB      TAB      Ectopic      Multiple      Live Births              Family History  Adopted: Yes  Problem Relation Age of Onset  . Other Son        Growing pains  . Asthma Mother   . Heart Problems Mother   . Migraines Mother   . COPD Mother   . Heart attack Mother   . Diabetes Father   . Peptic Ulcer Father   . Heart  Problems Father   . COPD Father   . Heart attack Father   . Colon cancer Neg Hx   . Colon polyps Neg Hx   . Esophageal cancer Neg Hx   . Rectal cancer Neg Hx   . Stomach cancer Neg Hx     Social History   Tobacco Use  . Smoking status: Former Smoker    Packs/day: 0.25    Years: 17.00  Pack years: 4.25    Types: Cigarettes    Quit date: 03/02/2013    Years since quitting: 6.9  . Smokeless tobacco: Never Used  Substance Use Topics  . Alcohol use: No    Alcohol/week: 0.0 standard drinks  . Drug use: Not Currently    Types: Marijuana    Comment: former - 6 yrs ago    Home Medications Prior to Admission medications   Medication Sig Start Date End Date Taking? Authorizing Provider  acetaZOLAMIDE (DIAMOX) 500 MG capsule Take 2 capsules (1,000 mg total) by mouth 2 (two) times daily. 01/28/20  Yes Kathrynn Ducking, MD  albuterol (PROVENTIL) (2.5 MG/3ML) 0.083% nebulizer solution Take 2.5 mg by nebulization every 6 (six) hours as needed for wheezing or shortness of breath.   Yes [provider]  albuterol (VENTOLIN HFA) 108 (90 Base) MCG/ACT inhaler Inhale 1-2 puffs into the lungs every 4 (four) hours as needed for wheezing or shortness of breath. 01/14/20  Yes Parrett, Fonnie Mu, NP  ALPRAZolam (XANAX) 1 MG tablet Take 0.5-1 tablets (0.5-1 mg total) by mouth at bedtime as needed for anxiety. 12/05/19  Yes Ngetich, Dinah C, NP  amphetamine-dextroamphetamine (ADDERALL XR) 30 MG 24 hr capsule Take 1 capsule (30 mg total) by mouth daily as needed (ADHD). 11/02/19  Yes Ngetich, Dinah C, NP  chlorthalidone (HYGROTON) 25 MG tablet TAKE 1 TABLET BY MOUTH EVERY DAY Patient taking differently: Take 25 mg by mouth daily.  11/05/19  Yes Ngetich, Dinah C, NP  citalopram (CELEXA) 20 MG tablet TAKE 1 TABLET BY MOUTH EVERY DAY Patient taking differently: Take 20 mg by mouth daily.  01/14/20  Yes Ngetich, Dinah C, NP  famotidine (PEPCID) 40 MG tablet Take 1 tablet (40 mg total) by mouth 2 (two) times  daily. 09/20/19  Yes Cirigliano, Vito V, DO  ferrous sulfate 325 (65 FE) MG tablet Take 325 mg by mouth daily with breakfast.   Yes [provider]  Fluticasone-Salmeterol (ADVAIR DISKUS) 100-50 MCG/DOSE AEPB Inhale 1 puff into the lungs 2 (two) times daily. 11/20/19  Yes Parrett, Tammy S, NP  hyoscyamine (LEVSIN SL) 0.125 MG SL tablet Place 1 tablet (0.125 mg total) under the tongue every 6 (six) hours as needed. Patient taking differently: Place 0.125 mg under the tongue every 6 (six) hours as needed for cramping.  11/05/19  Yes Cirigliano, Vito V, DO  levETIRAcetam (KEPPRA) 500 MG tablet 1/2 tablet twice daily for 1 week and then take 1 full tablet twice daily Patient taking differently: Take 500 mg by mouth at bedtime.  10/15/19  Yes Kathrynn Ducking, MD  metFORMIN (GLUCOPHAGE) 500 MG tablet Take 500 mg by mouth daily.    Yes [provider]  mometasone-formoterol (DULERA) 100-5 MCG/ACT AERO Inhale 2 puffs into the lungs 2 (two) times daily. 08/10/19  Yes Ngetich, Dinah C, NP  nortriptyline (PAMELOR) 25 MG capsule Take 2 capsules at bedtime. Patient taking differently: Take 50 mg by mouth at bedtime.  12/17/19  Yes Suzzanne Cloud, NP  spironolactone (ALDACTONE) 25 MG tablet TAKE 1 TABLET BY MOUTH EVERY DAY Patient taking differently: Take 25 mg by mouth daily.  11/05/19  Yes Ngetich, Dinah C, NP  sucralfate (CARAFATE) 1 g tablet Take 1 tablet (1 g total) by mouth 2 (two) times daily. 11/14/19  Yes Cirigliano, Vito V, DO  SUMAtriptan (IMITREX) 100 MG tablet TAKE 1 TABLET BY MOUTH 2X DAILY AS NEEDED FOR MIGRAINE. MAY REPEAT IN 2 HOURS IF HEADACHE PERSISTS OR RECURS.  Patient taking differently: Take 100 mg by mouth every 2 (two) hours as needed for migraine. May repeat in 2 hours if headache persists or reoccurs 12/11/19  Yes Kathrynn Ducking, MD  meloxicam (MOBIC) 7.5 MG tablet Take 1 tablet (7.5 mg total) by mouth daily. Patient not taking: Reported on 02/04/2020 10/09/19   Nahser,  Wonda Cheng, MD    Allergies    Aspirin  Review of Systems   Review of Systems Ten systems reviewed and are negative for acute change, except as noted in the HPI.   Physical Exam Updated Vital Signs BP (!) 156/85   Pulse 64   Temp 98.1 F (36.7 C) (Oral)   Resp 18   Ht 5\' 9"  (1.753 m)   Wt (!) 142.9 kg   LMP 02/19/2016   SpO2 100%   BMI 46.52 kg/m   Physical Exam Vitals and nursing note reviewed.  Constitutional:      General: She is not in acute distress.    Appearance: She is well-developed. She is not diaphoretic.  HENT:     Head: Normocephalic and atraumatic.  Eyes:     General: No scleral icterus.    Conjunctiva/sclera: Conjunctivae normal.     Pupils: Pupils are equal, round, and reactive to light.     Comments: No horizontal, vertical or rotational nystagmus  Neck:     Comments: Full active and passive ROM without pain No midline or paraspinal tenderness No nuchal rigidity or meningeal signs Cardiovascular:     Rate and Rhythm: Normal rate and regular rhythm.  Pulmonary:     Effort: Pulmonary effort is normal. No respiratory distress.     Breath sounds: Normal breath sounds. No wheezing or rales.  Abdominal:     General: Bowel sounds are normal.     Palpations: Abdomen is soft.     Tenderness: There is no abdominal tenderness. There is no guarding or rebound.  Musculoskeletal:        General: Normal range of motion.     Cervical back: Normal range of motion and neck supple.  Lymphadenopathy:     Cervical: No cervical adenopathy.  Skin:    General: Skin is warm and dry.     Findings: Lesion present.       Neurological:     Mental Status: She is alert and oriented to person, place, and time.     Cranial Nerves: No cranial nerve deficit.     Motor: No abnormal muscle tone.     Coordination: Coordination normal.     Comments: Mental Status:  Alert, oriented, thought content appropriate. Speech fluent without evidence of aphasia. Able to follow 2 step  commands without difficulty.  Cranial Nerves:  II:  Peripheral visual fields grossly normal, pupils equal, round, reactive to light III,IV, VI: ptosis not present, extra-ocular motions intact bilaterally  V,VII: smile symmetric, facial light touch sensation equal VIII: hearing grossly normal bilaterally  IX,X: midline uvula rise  XI: bilateral shoulder shrug equal and strong XII: midline tongue extension  Motor:  5/5 in upper and lower extremities bilaterally including strong and equal grip strength and dorsiflexion/plantar flexion Sensory: Pinprick and light touch normal in all extremities.  Cerebellar: normal finger-to-nose with bilateral upper extremities Gait: normal gait and balance CV: distal pulses palpable throughout   Psychiatric:        Behavior: Behavior normal.        Thought Content: Thought content normal.        Judgment: Judgment normal.  ED Results / Procedures / Treatments   Labs (all labs ordered are listed, but only abnormal results are displayed) Labs Reviewed - No data to display  EKG None  Radiology No results found.  Procedures Procedures (including critical care time)  Medications Ordered in ED Medications - No data to display  ED Course  I have reviewed the triage vital signs and the nursing notes.  Pertinent labs & imaging results that were available during my care of the patient were reviewed by me and considered in my medical decision making (see chart for details).    MDM Rules/Calculators/A&P                      Patient with small pustule on the leg. Will treat with Bactroban. No evidence of abscess.  Victoria Moore presents with headache Given the large differential diagnosis for [facility], the decision making in this case is of high complexity. Patient is on acetazolamide and may be having issues with her idiopathic intracranial hypertension.  She denies any vision changes.  She should follow closely with outpatient  neurology.  After evaluating all of the data points in this case, the presentation of Victoria Moore is NOT consistent with skull fracture, meningitis/encephalitis, SAH/sentinel bleed, Intracranial Hemorrhage (ICH) (subdural/epidural), acute obstructive hydrocephalus, space occupying lesions, CVA, CO Poisoning, Basilar/vertebral artery dissection, preeclampsia, cerebral venous thrombosis, hypertensive emergency, temporal Arteritis.  Strict return and follow-up precautions have been given by me personally or by detailed written instructions verbalized by nursing staff using the teach back method to patient/family/caregiver.  Data Reviewed/Counseling: I have reviewed the patient's vital signs, nursing notes, and other relevant tests/information. I had a detailed discussion regarding the historical points, exam findings, and any diagnostic results supporting the discharge diagnosis. I also discussed the need for outpatient follow-up and the need to return to the ED if symptoms worsen or if there are any questions or concerns that arise at hom   Final Clinical Impression(s) / ED Diagnoses Final diagnoses:  Skin pustule  Other migraine without status migrainosus, not intractable    Rx / DC Orders ED Discharge Orders    None       Margarita Mail, PA-C 02/05/20 0037    Varney Biles, MD 02/05/20 1609

## 2020-02-04 NOTE — ED Triage Notes (Signed)
Patient states she woke this AM and had a ared knot on the lateral aspect of the left leg. Area is red and painful.  Patient also c/o migraine headache that started today. Patient has a history of the same.

## 2020-02-05 NOTE — Discharge Instructions (Addendum)
Apply the Bactroban ointment to the affected area 2 x daily for at least 1 week. You are having a headache. No specific cause was found today for your headache. It may have been a migraine or other cause of headache. Stress, anxiety, fatigue, and depression are common triggers for headaches. Your headache today does not appear to be life-threatening or require hospitalization, but often the exact cause of headaches is not determined in the emergency department. Therefore, follow-up with your doctor is very important to find out what may have caused your headache, and whether or not you need any further diagnostic testing or treatment. Sometimes headaches can appear benign (not harmful), but then more serious symptoms can develop which should prompt an immediate re-evaluation by your doctor or the emergency department. SEEK MEDICAL ATTENTION IF: You develop possible problems with medications prescribed.  The medications don't resolve your headache, if it recurs , or if you have multiple episodes of vomiting or can't take fluids. You have a change from the usual headache. RETURN IMMEDIATELY IF you develop a sudden, severe headache or confusion, become poorly responsive or faint, develop a fever above 100.34F or problem breathing, have a change in speech, vision, swallowing, or understanding, or develop new weakness, numbness, tingling, incoordination, or have a seizure.

## 2020-02-11 ENCOUNTER — Ambulatory Visit: Payer: PRIVATE HEALTH INSURANCE | Admitting: Family

## 2020-02-12 ENCOUNTER — Other Ambulatory Visit: Payer: Self-pay

## 2020-02-12 ENCOUNTER — Ambulatory Visit
Admission: RE | Admit: 2020-02-12 | Discharge: 2020-02-12 | Disposition: A | Discharge status: Home / Self Care | Payer: 59 | Source: Ambulatory Visit | Attending: Internal Medicine | Admitting: Internal Medicine

## 2020-02-12 DIAGNOSIS — G4459 Other complicated headache syndrome: Secondary | ICD-10-CM | POA: Diagnosis not present

## 2020-02-12 LAB — POCT I-STAT CREATININE: Creatinine, Ser: 1 mg/dL (ref 0.44–1.00)

## 2020-02-12 MED ORDER — GADOBUTROL 1 MMOL/ML IV SOLN
10.0000 mL | Freq: Once | INTRAVENOUS | Status: AC | PRN
  Administered 2020-02-12: 10 mL via INTRAVENOUS

## 2020-02-14 DIAGNOSIS — G932 Benign intracranial hypertension: Secondary | ICD-10-CM | POA: Insufficient documentation

## 2020-02-14 DIAGNOSIS — H5213 Myopia, bilateral: Secondary | ICD-10-CM | POA: Insufficient documentation

## 2020-02-15 ENCOUNTER — Other Ambulatory Visit: Payer: Self-pay

## 2020-02-15 ENCOUNTER — Ambulatory Visit
Admission: RE | Admit: 2020-02-15 | Discharge: 2020-02-15 | Disposition: A | Discharge status: Home / Self Care | Payer: 59 | Source: Ambulatory Visit | Attending: Student | Admitting: Student

## 2020-02-15 DIAGNOSIS — G4730 Sleep apnea, unspecified: Secondary | ICD-10-CM | POA: Insufficient documentation

## 2020-02-15 DIAGNOSIS — R0602 Shortness of breath: Secondary | ICD-10-CM | POA: Insufficient documentation

## 2020-02-15 DIAGNOSIS — Z01810 Encounter for preprocedural cardiovascular examination: Secondary | ICD-10-CM | POA: Diagnosis not present

## 2020-02-15 DIAGNOSIS — I10 Essential (primary) hypertension: Secondary | ICD-10-CM | POA: Insufficient documentation

## 2020-02-15 DIAGNOSIS — G43909 Migraine, unspecified, not intractable, without status migrainosus: Secondary | ICD-10-CM | POA: Insufficient documentation

## 2020-02-15 DIAGNOSIS — D869 Sarcoidosis, unspecified: Secondary | ICD-10-CM | POA: Insufficient documentation

## 2020-02-15 DIAGNOSIS — I071 Rheumatic tricuspid insufficiency: Secondary | ICD-10-CM | POA: Diagnosis not present

## 2020-02-15 NOTE — Progress Notes (Signed)
*  PRELIMINARY RESULTS* Echocardiogram 2D Echocardiogram has been performed.  Victoria Moore 02/15/2020, 11:56 AM

## 2020-02-20 ENCOUNTER — Ambulatory Visit: Payer: 59

## 2020-02-27 ENCOUNTER — Ambulatory Visit
Admission: RE | Admit: 2020-02-27 | Discharge: 2020-02-27 | Disposition: A | Discharge status: Home / Self Care | Payer: 59 | Source: Ambulatory Visit | Attending: Student | Admitting: Student

## 2020-02-27 ENCOUNTER — Encounter
Admission: RE | Admit: 2020-02-27 | Discharge: 2020-02-27 | Disposition: A | Discharge status: Home / Self Care | Payer: 59 | Source: Ambulatory Visit

## 2020-02-27 ENCOUNTER — Other Ambulatory Visit: Payer: Self-pay

## 2020-02-27 DIAGNOSIS — I208 Other forms of angina pectoris: Secondary | ICD-10-CM | POA: Insufficient documentation

## 2020-02-27 DIAGNOSIS — Z01818 Encounter for other preprocedural examination: Secondary | ICD-10-CM | POA: Insufficient documentation

## 2020-02-27 MED ORDER — REGADENOSON 0.4 MG/5ML IV SOLN
0.4000 mg | Freq: Once | INTRAVENOUS | Status: DC
  Filled 2020-02-27: qty 5

## 2020-02-27 MED ORDER — TECHNETIUM TC 99M TETROFOSMIN IV KIT
30.0000 | PACK | Freq: Once | INTRAVENOUS | Status: AC | PRN
  Administered 2020-02-27: 09:00:00 29.813 via INTRAVENOUS

## 2020-02-27 MED ORDER — REGADENOSON 0.4 MG/5ML IV SOLN
0.4000 mg | Freq: Once | INTRAVENOUS | Status: AC
  Administered 2020-02-27: 0.4 mg via INTRAVENOUS
  Filled 2020-02-27: qty 5

## 2020-02-27 MED ORDER — TECHNETIUM TC 99M TETROFOSMIN IV KIT: 30.0000 | PACK | Freq: Once | INTRAVENOUS | Status: DC | PRN

## 2020-02-28 ENCOUNTER — Encounter
Admission: RE | Admit: 2020-02-28 | Discharge: 2020-02-28 | Disposition: A | Discharge status: Home / Self Care | Payer: 59 | Source: Ambulatory Visit | Attending: Student | Admitting: Student

## 2020-02-28 MED ORDER — TECHNETIUM TC 99M TETROFOSMIN IV KIT
32.8700 | PACK | Freq: Once | INTRAVENOUS | Status: AC | PRN
  Administered 2020-02-28: 32.87 via INTRAVENOUS

## 2020-03-11 LAB — NM MYOCAR MULTI W/SPECT W/WALL MOTION / EF
Estimated workload: 1 METS
Exercise duration (sec): 57 s
LV dias vol: 93 mL (ref 46–106)
LV sys vol: 40 mL
Peak HR: 95 {beats}/min
Percent HR: 54 %
Rest HR: 65 {beats}/min
SDS: 0
SRS: 8
SSS: 2
TID: 0.91

## 2020-03-13 ENCOUNTER — Ambulatory Visit: Payer: PRIVATE HEALTH INSURANCE | Admitting: Neurology

## 2020-03-13 NOTE — Progress Notes (Deleted)
PATIENT: Victoria Moore DOB: 1976-11-27  REASON FOR VISIT: follow up HISTORY FROM: patient  HISTORY OF PRESENT ILLNESS: Today 03/13/20  Ms. Khatib is a 44 year old female with history of seizures and migraine headaches.  She suffered a seizure in October, is on Keppra 500 mg twice a day.  CT head and CT angiogram was unremarkable.  She had a lumbar puncture due to continued headache with some intermittent episodes of visual loss, opening pressure was elevated at 27, likely indicating she has pseudotumor cerebri.  She was started on Diamox.  She complained of increased headache, was sent for repeat lumbar puncture, pressure is elevated at 27.  She has been increased on Diamox to 1000 mg twice a day.  HISTORY 12/12/2019 SS: Ms. Writer is a 44 year old female with history of seizures and migraine headaches.  She suffered a seizure in October, witnessed by her husband.  She has been placed on Keppra working up to 500 mg twice a day.  She had a CT of the head done with a CT angiogram that was unremarkable.  She was set up for lumbar puncture due to report of continued headache with some intermittent episodes of visual loss.  Opening pressure was elevated at 27, likely indicating the patient has pseudotumor cerebri.  She was started on Diamox.  She complained of increased headache, was sent for a repeat lumbar puncture, pressure was elevated at 27.  She remains on Diamox 500 mg twice a day.  She complains of continued daily headache, pressure in her head.  She indicates she will have times of blurry vision, and loss of vision every few days.  With the LP, she will have headache relief for a day or 2.  And the headache returns.  She has not had recurrent seizure.  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  She drives a car.  She says she is planning to have gastric bypass surgery.  She presents today for evaluation unaccompanied   REVIEW OF SYSTEMS: Out of a complete 14 system review of  symptoms, the patient complains only of the following symptoms, and all other reviewed systems are negative.  ALLERGIES: Allergies  Allergen Reactions  . Aspirin Anaphylaxis and Hives         HOME MEDICATIONS: Outpatient Medications Prior to Visit  Medication Sig Dispense Refill  . acetaZOLAMIDE (DIAMOX) 500 MG capsule Take 2 capsules (1,000 mg total) by mouth 2 (two) times daily. 120 capsule 3  . albuterol (PROVENTIL) (2.5 MG/3ML) 0.083% nebulizer solution Take 2.5 mg by nebulization every 6 (six) hours as needed for wheezing or shortness of breath.    Marland Kitchen albuterol (VENTOLIN HFA) 108 (90 Base) MCG/ACT inhaler Inhale 1-2 puffs into the lungs every 4 (four) hours as needed for wheezing or shortness of breath. 8 g 3  . ALPRAZolam (XANAX) 1 MG tablet Take 0.5-1 tablets (0.5-1 mg total) by mouth at bedtime as needed for anxiety. 30 tablet 0  . amphetamine-dextroamphetamine (ADDERALL XR) 30 MG 24 hr capsule Take 1 capsule (30 mg total) by mouth daily as needed (ADHD). 30 capsule 0  . chlorthalidone (HYGROTON) 25 MG tablet TAKE 1 TABLET BY MOUTH EVERY DAY (Patient taking differently: Take 25 mg by mouth daily. ) 90 tablet 1  . citalopram (CELEXA) 20 MG tablet TAKE 1 TABLET BY MOUTH EVERY DAY (Patient taking differently: Take 20 mg by mouth daily. ) 90 tablet 0  . famotidine (PEPCID) 40 MG tablet Take 1 tablet (40 mg  total) by mouth 2 (two) times daily. 180 tablet 5  . ferrous sulfate 325 (65 FE) MG tablet Take 325 mg by mouth daily with breakfast.    . Fluticasone-Salmeterol (ADVAIR DISKUS) 100-50 MCG/DOSE AEPB Inhale 1 puff into the lungs 2 (two) times daily. 60 each 5  . hyoscyamine (LEVSIN SL) 0.125 MG SL tablet Place 1 tablet (0.125 mg total) under the tongue every 6 (six) hours as needed. (Patient taking differently: Place 0.125 mg under the tongue every 6 (six) hours as needed for cramping. ) 30 tablet 1  . levETIRAcetam (KEPPRA) 500 MG tablet 1/2 tablet twice daily for 1 week and then take 1  full tablet twice daily (Patient taking differently: Take 500 mg by mouth at bedtime. ) 60 tablet 5  . meloxicam (MOBIC) 7.5 MG tablet Take 1 tablet (7.5 mg total) by mouth daily. (Patient not taking: Reported on 02/04/2020) 10 tablet 0  . metFORMIN (GLUCOPHAGE) 500 MG tablet Take 500 mg by mouth daily.     . mometasone-formoterol (DULERA) 100-5 MCG/ACT AERO Inhale 2 puffs into the lungs 2 (two) times daily. 13 g 3  . nortriptyline (PAMELOR) 25 MG capsule Take 2 capsules at bedtime. (Patient taking differently: Take 50 mg by mouth at bedtime. ) 180 capsule 1  . spironolactone (ALDACTONE) 25 MG tablet TAKE 1 TABLET BY MOUTH EVERY DAY (Patient taking differently: Take 25 mg by mouth daily. ) 90 tablet 1  . sucralfate (CARAFATE) 1 g tablet Take 1 tablet (1 g total) by mouth 2 (two) times daily. 90 tablet 3  . SUMAtriptan (IMITREX) 100 MG tablet TAKE 1 TABLET BY MOUTH 2X DAILY AS NEEDED FOR MIGRAINE. MAY REPEAT IN 2 HOURS IF HEADACHE PERSISTS OR RECURS. (Patient taking differently: Take 100 mg by mouth every 2 (two) hours as needed for migraine. May repeat in 2 hours if headache persists or reoccurs) 9 tablet 3   Facility-Administered Medications Prior to Visit  Medication Dose Route Frequency Provider Last Rate Last Admin  . nitroGLYCERIN (NITROSTAT) SL tablet 0.4 mg  0.4 mg Sublingual Q5 min PRN Ngetich, Dinah C, NP   0.4 mg at 09/14/19 1621    PAST MEDICAL HISTORY: Past Medical History:  Diagnosis Date  . Acute low back pain without sciatica 06/22/2018  . ADHD   . Allergy   . Anemia   . Anxiety   . Arthritis   . 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 Fibromyalgia, doubt sarcoid   . Asthma   . Blood transfusion without reported diagnosis   . Chest wall pain 05/26/2018  . Chronic kidney disease   . Chronic low back pain with sciatica 09/15/2018  . Common migraine with intractable migraine 10/07/2016  . Cough variant asthma vs UACS  10/23/2016     - Spirometry 04/15/2016  Very truncated exp loop effort dep portion only  - Allergy profile 10/22/2016 >  Eos 0.2/  IgE  52 RAST POS grass/trees/ragweed  10/22/2016  After extensive coaching HFA effectiveness =    75% try duelra 100 2bid > improved  . Daytime sleepiness 05/26/2018  . Decreased pedal pulses 10/04/2018  . Depression   . Depression, major, single episode, moderate ([place]) 05/26/2018  . Diabetes ([place])   . Dyspnea 04/15/2016   04/15/2016  Walked RA x 3 laps @ 185 ft each stopped due to  End of study, nl pace, no sob or desat    - Spirometry 04/15/2016  Very truncated exp loop effort dep portion only  - 10/22/2016  Walked RA x 3 laps @ 185 ft each stopped due to  End of study, slow pace, min sob/ no desat - full pfts rec 10/22/2016 >>>      . Edema   . Essential hypertension, benign 11/15/2016  . Fibroids 03/11/2016  . Frequency of urination 09/11/2018  . GERD (gastroesophageal reflux disease)   . Herniated lumbar intervertebral disc   . Hiatal hernia   . Hilar adenopathy   . Hypertension   . Hypokalemia 06/28/2018  . Impaired fasting blood sugar 09/11/2018  . Iron deficiency   . Leg pain 12/07/2018  . Low libido 11/27/2018  . Migraine   . Mild sleep apnea 08/03/2018   HST 06/20/18  AHI  8.1 / snoring with 02 nadir 80% >  08/03/2018 rec sleep medicine consultation   . Morbid (severe) obesity due to excess calories ([place]) 10/23/2016  . Personal history of sarcoidosis 05/26/2018  . Prolonged capillary refill time   . Right leg swelling 08/18/2018  . Sarcoidosis    personal history of  . Seizures ([place])    history of   . Sleep apnea   . Spondylosis   . Vitamin D deficiency     PAST SURGICAL HISTORY: Past Surgical History:  Procedure Laterality Date  . ABDOMINAL HYSTERECTOMY    . ESOPHAGEAL MANOMETRY N/A 09/05/2019   Procedure: ESOPHAGEAL MANOMETRY (EM);  Surgeon: Lavena Bullion, DO;  Location: [facility];  Service: Gastroenterology;  Laterality: N/A;  . LAPAROSCOPIC OVARIAN  CYSTECTOMY Left 03/11/2016   Procedure: LAPAROSCOPIC OVARIAN CYSTECTOMY;  Surgeon: Eldred Manges, MD;  Location: [facility];  Service: Gynecology;  Laterality: Left;  . LAPAROSCOPIC VAGINAL HYSTERECTOMY WITH SALPINGECTOMY Bilateral 03/11/2016   Procedure: LAPAROSCOPIC ASSISTED VAGINAL HYSTERECTOMY WITH SALPINGECTOMY;  Surgeon: Eldred Manges, MD;  Location: [facility];  Service: Gynecology;  Laterality: Bilateral;  . [place] IMPEDANCE STUDY  09/05/2019   Procedure: [place] IMPEDANCE STUDY;  Surgeon: Lavena Bullion, DO;  Location: [facility];  Service: Gastroenterology;;  . TUBAL LIGATION    . UPPER GASTROINTESTINAL ENDOSCOPY  10/11/2019   06/2019    FAMILY HISTORY: Family History  Adopted: Yes  Problem Relation Age of Onset  . Other Son        Growing pains  . Asthma Mother   . Heart Problems Mother   . Migraines Mother   . COPD Mother   . Heart attack Mother   . Diabetes Father   . Peptic Ulcer Father   . Heart Problems Father   . COPD Father   . Heart attack Father   . Colon cancer Neg Hx   . Colon polyps Neg Hx   . Esophageal cancer Neg Hx   . Rectal cancer Neg Hx   . Stomach cancer Neg Hx     SOCIAL HISTORY: Social History   Socioeconomic History  . Marital status: Single    Spouse name: Not on file  . Number of children: 1  . Years of education: 54  . Highest education level: Not on file  Occupational History  . Occupation: Company secretary  Tobacco Use  . Smoking status: Former Smoker    Packs/day: 0.25    Years: 17.00    Pack years: 4.25    Types: Cigarettes    Quit date: 03/02/2013    Years since quitting: 7.0  . Smokeless tobacco: Never Used  Substance and Sexual Activity  . Alcohol use: No  Alcohol/week: 0.0 standard drinks  . Drug use: Not Currently    Types: Marijuana    Comment: former - 6 yrs ago  . Sexual activity: Not on file  Other Topics Concern  . Not on file  Social History Narrative   She lives w/ her fiance'. She has one son.    Highest level of  education:  12th grade, did not graduate. Currently back in school.   Right-handed   Caffeine: 1 cup of coffee some mornings      Social History      Diet? No       Do you drink/eat things with caffeine? yes      Marital status?           Single                          What year were you married?       Do you live in a house, apartment, assisted living, condo, trailer, etc.? house      Is it one or more stories?     How many persons live in your home?  2      Do you have any pets in your home? (please list)   No       Highest level of education completed? Some college       Current or past profession:       Do you exercise?       Yes                                Type & how often? Daily       Advanced Directives      Do you have a living will? No       Do you have a DNR form?                                  If not, do you want to discuss one? No       Do you have signed POA/HPOA for forms?  No       Functional Status      Do you have difficulty bathing or dressing yourself? No       Do you have difficulty preparing food or eating? No       Do you have difficulty managing your medications? No       Do you have difficulty managing your finances? No       Do you have difficulty affording your medications? No       Social Determinants of Health   Financial Resource Strain:   . Difficulty of Paying Living Expenses:   Food Insecurity:   . Worried About Charity fundraiser in the Last Year:   . Arboriculturist in the Last Year:   Transportation Needs:   . Film/video editor (Medical):   Marland Kitchen Lack of Transportation (Non-Medical):   Physical Activity:   . Days of Exercise per Week:   . Minutes of Exercise per Session:   Stress:   . Feeling of Stress :   Social Connections:   . Frequency of Communication with Friends and Family:   . Frequency of Social Gatherings with Friends and Family:   . Attends Religious Services:   . Active Member of  Clubs or  Organizations:   . Attends Archivist Meetings:   Marland Kitchen Marital Status:   Intimate Partner Violence:   . Fear of Current or Ex-Partner:   . Emotionally Abused:   Marland Kitchen Physically Abused:   . Sexually Abused:       PHYSICAL EXAM  There were no vitals filed for this visit. There is no height or weight on file to calculate BMI.  Generalized: Well developed, in no acute distress   Neurological examination  Mentation: Alert oriented to time, place, history taking. Follows all commands speech and language fluent Cranial nerve II-XII: Pupils were equal round reactive to light. Extraocular movements were full, visual field were full on confrontational test. Facial sensation and strength were normal. Uvula tongue midline. Head turning and shoulder shrug  were normal and symmetric. Motor: The motor testing reveals 5 over 5 strength of all 4 extremities. Good symmetric motor tone is noted throughout.  Sensory: Sensory testing is intact to soft touch on all 4 extremities. No evidence of extinction is noted.  Coordination: Cerebellar testing reveals good finger-nose-finger and heel-to-shin bilaterally.  Gait and station: Gait is normal. Tandem gait is normal. Romberg is negative. No drift is seen.  Reflexes: Deep tendon reflexes are symmetric and normal bilaterally.   DIAGNOSTIC DATA (LABS, IMAGING, TESTING) - I reviewed patient records, labs, notes, testing and imaging myself where available.  Lab Results  Component Value Date   WBC 6.5 09/14/2019   HGB 13.7 09/14/2019   HCT 42.5 09/14/2019   MCV 87.3 09/14/2019   PLT 252 09/14/2019      Component Value Date/Time   NA 137 09/14/2019 1718   NA 144 09/25/2018 1317   K 3.3 (L) 09/14/2019 1718   CL 99 09/14/2019 1718   CO2 27 09/14/2019 1718   GLUCOSE 134 (H) 09/14/2019 1718   BUN 13 09/14/2019 1718   BUN 15 09/25/2018 1317   CREATININE 1.00 02/12/2020 1620   CREATININE 0.96 08/22/2019 1540   CALCIUM 9.3 09/14/2019 1718   PROT  6.8 08/22/2019 1540   PROT 7.0 11/24/2017 1629   ALBUMIN 3.8 06/25/2019 1109   ALBUMIN 4.3 11/24/2017 1629   AST 13 08/22/2019 1540   ALT 10 08/22/2019 1540   ALKPHOS 50 06/25/2019 1109   BILITOT 0.2 08/22/2019 1540   BILITOT <0.2 11/24/2017 1629   GFRNONAA 56 (L) 09/14/2019 1718   GFRNONAA 71 08/22/2019 1540   GFRAA >60 09/14/2019 1718   GFRAA 83 08/22/2019 1540   Lab Results  Component Value Date   CHOL 192 08/22/2019   HDL 53 08/22/2019   LDLCALC 118 (H) 08/22/2019   TRIG 105 08/22/2019   CHOLHDL 3.6 08/22/2019   Lab Results  Component Value Date   HGBA1C 6.1 (H) 08/22/2019   Lab Results  Component Value Date   [id number] 06/27/2018   Lab Results  Component Value Date   TSH 1.21 08/22/2019      ASSESSMENT AND PLAN 44 y.o. year old female  has a past medical history of Acute low back pain without sciatica (06/22/2018), ADHD, Allergy, Anemia, Anxiety, Arthritis, Arthritis of ankle joint (11/10/2016), Asthma, Blood transfusion without reported diagnosis, Chest wall pain (05/26/2018), Chronic kidney disease, Chronic low back pain with sciatica (09/15/2018), Common migraine with intractable migraine (10/07/2016), Cough variant asthma vs UACS  (10/23/2016), Daytime sleepiness (05/26/2018), Decreased pedal pulses (10/04/2018), Depression, Depression, major, single episode, moderate ([place]) (05/26/2018), Diabetes ([place]), Dyspnea (04/15/2016), Edema, Essential hypertension, benign (11/15/2016), Fibroids (03/11/2016), Frequency of urination (09/11/2018),  GERD (gastroesophageal reflux disease), Herniated lumbar intervertebral disc, Hiatal hernia, Hilar adenopathy, Hypertension, Hypokalemia (06/28/2018), Impaired fasting blood sugar (09/11/2018), Iron deficiency, Leg pain (12/07/2018), Low libido (11/27/2018), Migraine, Mild sleep apnea (08/03/2018), Morbid (severe) obesity due to excess calories ([place]) (10/23/2016), Personal history of sarcoidosis (05/26/2018), Prolonged capillary refill time, Right  leg swelling (08/18/2018), Sarcoidosis, Seizures ([place]), Sleep apnea, Spondylosis, and Vitamin D deficiency. here with ***   I spent 15 minutes with the patient. 50% of this time was spent   Butler Denmark, [place], DNP 03/13/2020, 5:58 AM [facility] [address] [place], [place] [address] [phone]

## 2020-03-18 ENCOUNTER — Encounter: Payer: Self-pay | Admitting: Neurology

## 2020-04-27 ENCOUNTER — Other Ambulatory Visit: Payer: Self-pay

## 2020-04-27 ENCOUNTER — Encounter (HOSPITAL_COMMUNITY): Payer: Self-pay

## 2020-04-27 ENCOUNTER — Emergency Department (HOSPITAL_COMMUNITY): Payer: 59

## 2020-04-27 ENCOUNTER — Inpatient Hospital Stay (HOSPITAL_COMMUNITY)
Admission: EM | Admit: 2020-04-27 | Discharge: 2020-04-30 | DRG: 392 | Disposition: A | Discharge status: Home / Self Care | Payer: 59 | Attending: Internal Medicine | Admitting: Internal Medicine

## 2020-04-27 DIAGNOSIS — J45909 Unspecified asthma, uncomplicated: Secondary | ICD-10-CM | POA: Diagnosis present

## 2020-04-27 DIAGNOSIS — Z20822 Contact with and (suspected) exposure to covid-19: Secondary | ICD-10-CM | POA: Diagnosis present

## 2020-04-27 DIAGNOSIS — I10 Essential (primary) hypertension: Secondary | ICD-10-CM | POA: Diagnosis present

## 2020-04-27 DIAGNOSIS — M1990 Unspecified osteoarthritis, unspecified site: Secondary | ICD-10-CM | POA: Diagnosis present

## 2020-04-27 DIAGNOSIS — R569 Unspecified convulsions: Secondary | ICD-10-CM

## 2020-04-27 DIAGNOSIS — Z833 Family history of diabetes mellitus: Secondary | ICD-10-CM | POA: Diagnosis not present

## 2020-04-27 DIAGNOSIS — E611 Iron deficiency: Secondary | ICD-10-CM | POA: Diagnosis not present

## 2020-04-27 DIAGNOSIS — E559 Vitamin D deficiency, unspecified: Secondary | ICD-10-CM | POA: Diagnosis present

## 2020-04-27 DIAGNOSIS — Z6841 Body Mass Index (BMI) 40.0 and over, adult: Secondary | ICD-10-CM

## 2020-04-27 DIAGNOSIS — Z9884 Bariatric surgery status: Secondary | ICD-10-CM | POA: Diagnosis not present

## 2020-04-27 DIAGNOSIS — Z825 Family history of asthma and other chronic lower respiratory diseases: Secondary | ICD-10-CM | POA: Diagnosis not present

## 2020-04-27 DIAGNOSIS — D869 Sarcoidosis, unspecified: Secondary | ICD-10-CM | POA: Diagnosis present

## 2020-04-27 DIAGNOSIS — R109 Unspecified abdominal pain: Secondary | ICD-10-CM | POA: Diagnosis not present

## 2020-04-27 DIAGNOSIS — K910 Vomiting following gastrointestinal surgery: Secondary | ICD-10-CM | POA: Diagnosis present

## 2020-04-27 DIAGNOSIS — G8918 Other acute postprocedural pain: Secondary | ICD-10-CM | POA: Diagnosis present

## 2020-04-27 DIAGNOSIS — G43909 Migraine, unspecified, not intractable, without status migrainosus: Secondary | ICD-10-CM | POA: Diagnosis present

## 2020-04-27 DIAGNOSIS — Z90710 Acquired absence of both cervix and uterus: Secondary | ICD-10-CM | POA: Diagnosis not present

## 2020-04-27 DIAGNOSIS — K9589 Other complications of other bariatric procedure: Secondary | ICD-10-CM | POA: Diagnosis present

## 2020-04-27 DIAGNOSIS — D649 Anemia, unspecified: Secondary | ICD-10-CM | POA: Diagnosis not present

## 2020-04-27 DIAGNOSIS — Z9851 Tubal ligation status: Secondary | ICD-10-CM

## 2020-04-27 DIAGNOSIS — Z90722 Acquired absence of ovaries, bilateral: Secondary | ICD-10-CM

## 2020-04-27 DIAGNOSIS — F909 Attention-deficit hyperactivity disorder, unspecified type: Secondary | ICD-10-CM | POA: Diagnosis present

## 2020-04-27 DIAGNOSIS — E66811 Obesity, class 1: Secondary | ICD-10-CM | POA: Diagnosis present

## 2020-04-27 DIAGNOSIS — F329 Major depressive disorder, single episode, unspecified: Secondary | ICD-10-CM | POA: Diagnosis present

## 2020-04-27 DIAGNOSIS — D509 Iron deficiency anemia, unspecified: Secondary | ICD-10-CM | POA: Diagnosis present

## 2020-04-27 DIAGNOSIS — Z886 Allergy status to analgesic agent status: Secondary | ICD-10-CM

## 2020-04-27 DIAGNOSIS — Z87891 Personal history of nicotine dependence: Secondary | ICD-10-CM

## 2020-04-27 DIAGNOSIS — G4733 Obstructive sleep apnea (adult) (pediatric): Secondary | ICD-10-CM | POA: Diagnosis present

## 2020-04-27 DIAGNOSIS — Z79899 Other long term (current) drug therapy: Secondary | ICD-10-CM

## 2020-04-27 DIAGNOSIS — K219 Gastro-esophageal reflux disease without esophagitis: Secondary | ICD-10-CM | POA: Diagnosis present

## 2020-04-27 DIAGNOSIS — E119 Type 2 diabetes mellitus without complications: Secondary | ICD-10-CM | POA: Diagnosis present

## 2020-04-27 DIAGNOSIS — N179 Acute kidney failure, unspecified: Secondary | ICD-10-CM | POA: Diagnosis present

## 2020-04-27 DIAGNOSIS — Z7984 Long term (current) use of oral hypoglycemic drugs: Secondary | ICD-10-CM

## 2020-04-27 LAB — CBC
HCT: 37 % (ref 36.0–46.0)
Hemoglobin: 11.6 g/dL — ABNORMAL LOW (ref 12.0–15.0)
MCH: 27.6 pg (ref 26.0–34.0)
MCHC: 31.4 g/dL (ref 30.0–36.0)
MCV: 87.9 fL (ref 80.0–100.0)
Platelets: 250 10*3/uL (ref 150–400)
RBC: 4.21 MIL/uL (ref 3.87–5.11)
RDW: 13.4 % (ref 11.5–15.5)
WBC: 6.6 10*3/uL (ref 4.0–10.5)
nRBC: 0 % (ref 0.0–0.2)

## 2020-04-27 LAB — URINALYSIS, ROUTINE W REFLEX MICROSCOPIC
Bacteria, UA: NONE SEEN
Bilirubin Urine: NEGATIVE
Glucose, UA: NEGATIVE mg/dL
Hgb urine dipstick: NEGATIVE
Ketones, ur: 20 mg/dL — AB
Nitrite: NEGATIVE
Protein, ur: NEGATIVE mg/dL
Specific Gravity, Urine: 1.01 (ref 1.005–1.030)
pH: 5 (ref 5.0–8.0)

## 2020-04-27 LAB — RESPIRATORY PANEL BY RT PCR (FLU A&B, COVID)
Influenza A by PCR: NEGATIVE
Influenza B by PCR: NEGATIVE
SARS Coronavirus 2 by RT PCR: NEGATIVE

## 2020-04-27 LAB — COMPREHENSIVE METABOLIC PANEL
ALT: 17 U/L (ref 0–44)
AST: 19 U/L (ref 15–41)
Albumin: 3.8 g/dL (ref 3.5–5.0)
Alkaline Phosphatase: 57 U/L (ref 38–126)
Anion gap: 8 (ref 5–15)
BUN: 11 mg/dL (ref 6–20)
CO2: 28 mmol/L (ref 22–32)
Calcium: 8.6 mg/dL — ABNORMAL LOW (ref 8.9–10.3)
Chloride: 101 mmol/L (ref 98–111)
Creatinine, Ser: 1.02 mg/dL — ABNORMAL HIGH (ref 0.44–1.00)
GFR calc Af Amer: >60 mL/min (ref >60)
GFR calc non Af Amer: >60 mL/min (ref >60)
Glucose, Bld: 86 mg/dL (ref 70–99)
Potassium: 3.4 mmol/L — ABNORMAL LOW (ref 3.5–5.1)
Sodium: 137 mmol/L (ref 135–145)
Total Bilirubin: 0.6 mg/dL (ref 0.3–1.2)
Total Protein: 7.3 g/dL (ref 6.5–8.1)

## 2020-04-27 LAB — TROPONIN I (HIGH SENSITIVITY)
Troponin I (High Sensitivity): <2 ng/L (ref <18)
Troponin I (High Sensitivity): <2 ng/L (ref <18)

## 2020-04-27 LAB — HIV ANTIBODY (ROUTINE TESTING W REFLEX): HIV Screen 4th Generation wRfx: NONREACTIVE

## 2020-04-27 LAB — LIPASE, BLOOD: Lipase: 21 U/L (ref 11–51)

## 2020-04-27 LAB — LACTIC ACID, PLASMA: Lactic Acid, Venous: 0.8 mmol/L (ref 0.5–1.9)

## 2020-04-27 LAB — D-DIMER, QUANTITATIVE: D-Dimer, Quant: 4.27 ug/mL-FEU — ABNORMAL HIGH (ref 0.00–0.50)

## 2020-04-27 MED ORDER — CITALOPRAM HYDROBROMIDE 20 MG PO TABS
20.0000 mg | ORAL_TABLET | Freq: Every day | ORAL | Status: DC
  Administered 2020-04-27 – 2020-04-30 (×4): 20 mg via ORAL
  Filled 2020-04-27 (×4): qty 1

## 2020-04-27 MED ORDER — MORPHINE SULFATE (PF) 2 MG/ML IV SOLN
2.0000 mg | INTRAVENOUS | Status: DC | PRN
  Administered 2020-04-27 (×3): 2 mg via INTRAVENOUS
  Filled 2020-04-27 (×3): qty 1

## 2020-04-27 MED ORDER — SUCRALFATE 1 G PO TABS: 1.0000 g | ORAL_TABLET | Freq: Two times a day (BID) | ORAL | Status: DC

## 2020-04-27 MED ORDER — ENOXAPARIN SODIUM 40 MG/0.4ML ~~LOC~~ SOLN
40.0000 mg | SUBCUTANEOUS | Status: DC
  Administered 2020-04-27 – 2020-04-29 (×3): 40 mg via SUBCUTANEOUS
  Filled 2020-04-27 (×3): qty 0.4

## 2020-04-27 MED ORDER — SPIRONOLACTONE 25 MG PO TABS
25.0000 mg | ORAL_TABLET | Freq: Every day | ORAL | Status: DC
  Administered 2020-04-27 – 2020-04-30 (×4): 25 mg via ORAL
  Filled 2020-04-27 (×4): qty 1

## 2020-04-27 MED ORDER — ACETAMINOPHEN 325 MG PO TABS
650.0000 mg | ORAL_TABLET | Freq: Four times a day (QID) | ORAL | Status: DC | PRN
  Administered 2020-04-27 – 2020-04-29 (×2): 650 mg via ORAL
  Filled 2020-04-27 (×2): qty 2

## 2020-04-27 MED ORDER — ONDANSETRON HCL 4 MG/2ML IJ SOLN
4.0000 mg | Freq: Once | INTRAMUSCULAR | Status: AC
  Administered 2020-04-27: 4 mg via INTRAVENOUS
  Filled 2020-04-27: qty 2

## 2020-04-27 MED ORDER — MOMETASONE FURO-FORMOTEROL FUM 100-5 MCG/ACT IN AERO: 2.0000 | INHALATION_SPRAY | Freq: Two times a day (BID) | RESPIRATORY_TRACT | Status: DC

## 2020-04-27 MED ORDER — MOMETASONE FURO-FORMOTEROL FUM 100-5 MCG/ACT IN AERO
2.0000 | INHALATION_SPRAY | Freq: Two times a day (BID) | RESPIRATORY_TRACT | Status: DC
  Administered 2020-04-27 – 2020-04-29 (×5): 2 via RESPIRATORY_TRACT
  Filled 2020-04-27: qty 8.8

## 2020-04-27 MED ORDER — MORPHINE SULFATE (PF) 4 MG/ML IV SOLN
4.0000 mg | Freq: Once | INTRAVENOUS | Status: AC
  Administered 2020-04-27: 4 mg via INTRAVENOUS
  Filled 2020-04-27: qty 1

## 2020-04-27 MED ORDER — FERROUS SULFATE 325 (65 FE) MG PO TABS
325.0000 mg | ORAL_TABLET | Freq: Every day | ORAL | Status: DC
  Administered 2020-04-28 – 2020-04-30 (×3): 325 mg via ORAL
  Filled 2020-04-27 (×3): qty 1

## 2020-04-27 MED ORDER — HYOSCYAMINE SULFATE 0.125 MG SL SUBL: 0.1250 mg | SUBLINGUAL_TABLET | Freq: Four times a day (QID) | SUBLINGUAL | Status: DC | PRN

## 2020-04-27 MED ORDER — SODIUM CHLORIDE (PF) 0.9 % IJ SOLN
INTRAMUSCULAR | Status: AC
  Filled 2020-04-27: qty 50

## 2020-04-27 MED ORDER — IOHEXOL 350 MG/ML SOLN
100.0000 mL | Freq: Once | INTRAVENOUS | Status: AC | PRN
  Administered 2020-04-27: 100 mL via INTRAVENOUS

## 2020-04-27 MED ORDER — DEXTROSE IN LACTATED RINGERS 5 % IV SOLN: INTRAVENOUS | Status: DC

## 2020-04-27 MED ORDER — ONDANSETRON HCL 4 MG PO TABS: 4.0000 mg | ORAL_TABLET | Freq: Four times a day (QID) | ORAL | Status: DC | PRN

## 2020-04-27 MED ORDER — NORTRIPTYLINE HCL 25 MG PO CAPS
50.0000 mg | ORAL_CAPSULE | Freq: Every day | ORAL | Status: DC
  Administered 2020-04-27 – 2020-04-29 (×3): 50 mg via ORAL
  Filled 2020-04-27 (×3): qty 2

## 2020-04-27 MED ORDER — ACETAMINOPHEN 650 MG RE SUPP: 650.0000 mg | Freq: Four times a day (QID) | RECTAL | Status: DC | PRN

## 2020-04-27 MED ORDER — ACETAZOLAMIDE ER 500 MG PO CP12: 1000.0000 mg | ORAL_CAPSULE | Freq: Two times a day (BID) | ORAL | Status: DC

## 2020-04-27 MED ORDER — SODIUM CHLORIDE 0.9 % IV BOLUS
1000.0000 mL | Freq: Once | INTRAVENOUS | Status: AC
  Administered 2020-04-27: 1000 mL via INTRAVENOUS

## 2020-04-27 MED ORDER — HYDROMORPHONE HCL 1 MG/ML IJ SOLN
1.0000 mg | Freq: Once | INTRAMUSCULAR | Status: AC
  Administered 2020-04-27: 1 mg via INTRAVENOUS
  Filled 2020-04-27: qty 1

## 2020-04-27 MED ORDER — ONDANSETRON HCL 4 MG/2ML IJ SOLN: 4.0000 mg | Freq: Four times a day (QID) | INTRAMUSCULAR | Status: DC | PRN

## 2020-04-27 MED ORDER — ALBUTEROL SULFATE HFA 108 (90 BASE) MCG/ACT IN AERS: 1.0000 | INHALATION_SPRAY | RESPIRATORY_TRACT | Status: DC | PRN

## 2020-04-27 MED ORDER — SODIUM CHLORIDE 0.9% FLUSH: 3.0000 mL | Freq: Once | INTRAVENOUS | Status: DC

## 2020-04-27 MED ORDER — LEVETIRACETAM 500 MG PO TABS
500.0000 mg | ORAL_TABLET | Freq: Every day | ORAL | Status: DC
  Administered 2020-04-27 – 2020-04-29 (×3): 500 mg via ORAL
  Filled 2020-04-27 (×3): qty 1

## 2020-04-27 MED ORDER — ALBUTEROL SULFATE (2.5 MG/3ML) 0.083% IN NEBU: 2.5000 mg | INHALATION_SOLUTION | Freq: Four times a day (QID) | RESPIRATORY_TRACT | Status: DC | PRN

## 2020-04-27 MED ORDER — PANTOPRAZOLE SODIUM 40 MG PO TBEC
40.0000 mg | DELAYED_RELEASE_TABLET | Freq: Every day | ORAL | Status: DC
  Administered 2020-04-27: 40 mg via ORAL
  Filled 2020-04-27: qty 1

## 2020-04-27 MED ORDER — AMPHETAMINE-DEXTROAMPHET ER 10 MG PO CP24: 30.0000 mg | ORAL_CAPSULE | Freq: Every day | ORAL | Status: DC | PRN

## 2020-04-27 MED ORDER — ALPRAZOLAM 0.5 MG PO TABS
0.5000 mg | ORAL_TABLET | Freq: Every evening | ORAL | Status: DC | PRN
  Administered 2020-04-28 – 2020-04-29 (×2): 0.5 mg via ORAL
  Filled 2020-04-27 (×2): qty 1

## 2020-04-27 MED ORDER — METOCLOPRAMIDE HCL 5 MG PO TABS
5.0000 mg | ORAL_TABLET | Freq: Three times a day (TID) | ORAL | Status: DC
  Administered 2020-04-27 – 2020-04-28 (×2): 5 mg via ORAL
  Filled 2020-04-27: qty 1

## 2020-04-27 NOTE — ED Notes (Signed)
PT rectal temp obtained by EMT w/ RN in room.

## 2020-04-27 NOTE — ED Triage Notes (Signed)
Pt reports LLQ pain. Reports gastric bypass on 4/26. Endorses intermittent nausea. Pt is diabetic, CBG 94.

## 2020-04-27 NOTE — ED Notes (Signed)
Patient has been stuck multiple times in an attempt to obtain second set of cultures and 2nd lactic. Main lab phlebotomy came and also tried to stick patient without success.

## 2020-04-27 NOTE — ED Notes (Signed)
PT provided cup of apple juice per request.

## 2020-04-27 NOTE — ED Provider Notes (Signed)
[facility] DEPT Provider Note   CSN: [id number] Arrival date & time: 04/27/20  [id number]     History Chief Complaint  Patient presents with  . Abdominal Pain    Victoria Moore is a 44 y.o. female.  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 Pain located to the left side of the abd and radiates to the back. Pain feels like a stabbing/aching pain. Rates pain >10/10. Denies associated fever, but has had sweats, chills. She reports nausea and vomiting that started yesterday. Has not had a BM since surgery. She is not passing flatus. She also reports having chest pain, shortness of breath, and pleuritic. She has been taking oxycodone, gabapentin, tramadol without relief.   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 Andrey Farmer at [facility]  States that she initially had pain following surgery however it improved and she was discharged.  Pain worsened this morning and she was advised to come to the ED if pain was unable to be controlled at home.  Past Medical History:  Diagnosis Date  . Acute low back pain without sciatica 06/22/2018  . ADHD   . Allergy   . Anemia   . Anxiety   . Arthritis   . 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 Fibromyalgia, doubt sarcoid   . Asthma   . Blood transfusion without reported diagnosis   . Chest wall pain 05/26/2018  . Chronic kidney disease   . Chronic low back pain with sciatica 09/15/2018  . Common migraine with intractable migraine 10/07/2016  . Cough variant asthma vs UACS  10/23/2016   - Spirometry 04/15/2016  Very truncated exp loop effort dep portion only  - Allergy profile 10/22/2016 >  Eos 0.2/  IgE  52 RAST POS  grass/trees/ragweed  10/22/2016  After extensive coaching HFA effectiveness =    75% try duelra 100 2bid > improved  . Daytime sleepiness 05/26/2018  . Decreased pedal pulses 10/04/2018  . Depression   . Depression, major, single episode, moderate ([place]) 05/26/2018  . Diabetes ([place])   . Dyspnea 04/15/2016   04/15/2016  Walked RA x 3 laps @ 185 ft each stopped due to  End of study, nl pace, no sob or desat    - Spirometry 04/15/2016  Very truncated exp loop effort dep portion only  - 10/22/2016  Walked RA x 3 laps @ 185 ft each stopped due to  End of study, slow pace, min sob/ no desat - full pfts rec 10/22/2016 >>>      . Edema   . Essential hypertension, benign 11/15/2016  . Fibroids 03/11/2016  . Frequency of urination 09/11/2018  . GERD (gastroesophageal reflux disease)   . Herniated lumbar intervertebral disc   . Hiatal hernia   . Hilar adenopathy   . Hypertension   . Hypokalemia 06/28/2018  . Impaired fasting blood sugar 09/11/2018  . Iron deficiency   . Leg pain 12/07/2018  . Low libido 11/27/2018  . Migraine   . Mild sleep apnea 08/03/2018   HST 06/20/18  AHI  8.1 / snoring with 02 nadir 80% >  08/03/2018 rec sleep medicine consultation   . Morbid (severe) obesity due to  excess calories ([place]) 10/23/2016  . Personal history of sarcoidosis 05/26/2018  . Prolonged capillary refill time   . Right leg swelling 08/18/2018  . Sarcoidosis    personal history of  . Seizures ([place])    history of   . Sleep apnea   . Spondylosis   . Vitamin D deficiency     Patient Active Problem List   Diagnosis Date Noted  . Intractable abdominal pain 04/27/2020  . Other intervertebral disc degeneration, lumbar region 09/19/2019  . Complex cyst of left ovary 02/01/2019  . Sarcoidosis 02/01/2019  . Leg pain 12/07/2018  . Low libido 11/27/2018  . Prolonged capillary refill time 10/04/2018  . Decreased pedal pulses 10/04/2018  . Spondylosis 09/15/2018  . Chronic low back pain with sciatica 09/15/2018  .  Impaired fasting blood sugar 09/11/2018  . Weight gain 09/11/2018  . Mild sleep apnea 08/03/2018  . Hypokalemia 06/28/2018  . Medication side effect 06/22/2018  . Acute low back pain without sciatica 06/22/2018  . Personal history of sarcoidosis 05/26/2018  . Vitamin D deficiency 05/26/2018  . Encounter for health maintenance examination with abnormal findings 05/26/2018  . Depression, major, single episode, moderate ([place]) 05/26/2018  . Daytime sleepiness 05/26/2018  . Chest pain of uncertain etiology [id number]  . GERD (gastroesophageal reflux disease) 05/26/2018  . Essential hypertension, benign 11/15/2016  . 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 Morbid (severe) obesity due to excess calories ([place]) 10/23/2016  . Common migraine with intractable migraine 10/07/2016  . Dyspnea 04/15/2016  . Hilar adenopathy 04/15/2016  . Seizures ([place]) 03/11/2016  . Anemia 11/12/2014  . Iron deficiency 11/12/2014    Past Surgical History:  Procedure Laterality Date  . ABDOMINAL HYSTERECTOMY    . ESOPHAGEAL MANOMETRY N/A 09/05/2019   Procedure: ESOPHAGEAL MANOMETRY (EM);  Surgeon: Lavena Bullion, DO;  Location: [facility];  Service: Gastroenterology;  Laterality: N/A;  . LAPAROSCOPIC OVARIAN CYSTECTOMY Left 03/11/2016   Procedure: LAPAROSCOPIC OVARIAN CYSTECTOMY;  Surgeon: Eldred Manges, MD;  Location: [facility];  Service: Gynecology;  Laterality: Left;  . LAPAROSCOPIC VAGINAL HYSTERECTOMY WITH SALPINGECTOMY Bilateral 03/11/2016   Procedure: LAPAROSCOPIC ASSISTED VAGINAL HYSTERECTOMY WITH SALPINGECTOMY;  Surgeon: Eldred Manges, MD;  Location: [facility];  Service: Gynecology;  Laterality: Bilateral;  . [place] IMPEDANCE STUDY  09/05/2019   Procedure: [place] IMPEDANCE STUDY;  Surgeon: Lavena Bullion, DO;  Location: [facility];  Service: Gastroenterology;;  . TUBAL LIGATION    . UPPER GASTROINTESTINAL ENDOSCOPY  10/11/2019   06/2019     OB History    Gravida  1   Para      Term      Preterm        AB      Living        SAB      TAB      Ectopic      Multiple      Live Births              Family History  Adopted: Yes  Problem Relation Age of Onset  . Other Son        Growing pains  . Asthma Mother   . Heart Problems Mother   . Migraines Mother   . COPD Mother   . Heart attack Mother   . Diabetes Father   . Peptic Ulcer Father   . Heart Problems Father   . COPD Father   . Heart attack Father   . Colon cancer Neg Hx   .  Colon polyps Neg Hx   . Esophageal cancer Neg Hx   . Rectal cancer Neg Hx   . Stomach cancer Neg Hx     Social History   Tobacco Use  . Smoking status: Former Smoker    Packs/day: 0.25    Years: 17.00    Pack years: 4.25    Types: Cigarettes    Quit date: 03/02/2013    Years since quitting: 7.1  . Smokeless tobacco: Never Used  Substance Use Topics  . Alcohol use: No    Alcohol/week: 0.0 standard drinks  . Drug use: Not Currently    Types: Marijuana    Comment: former - 6 yrs ago    Home Medications Prior to Admission medications   Medication Sig Start Date End Date Taking? Authorizing Provider  acetaZOLAMIDE (DIAMOX) 500 MG capsule Take 2 capsules (1,000 mg total) by mouth 2 (two) times daily. 01/28/20   Kathrynn Ducking, MD  albuterol (PROVENTIL) (2.5 MG/3ML) 0.083% nebulizer solution Take 2.5 mg by nebulization every 6 (six) hours as needed for wheezing or shortness of breath.    [provider]  albuterol (VENTOLIN HFA) 108 (90 Base) MCG/ACT inhaler Inhale 1-2 puffs into the lungs every 4 (four) hours as needed for wheezing or shortness of breath. 01/14/20   Parrett, Fonnie Mu, NP  ALPRAZolam Duanne Moron) 1 MG tablet Take 0.5-1 tablets (0.5-1 mg total) by mouth at bedtime as needed for anxiety. 12/05/19   Ngetich, Dinah C, NP  amphetamine-dextroamphetamine (ADDERALL XR) 30 MG 24 hr capsule Take 1 capsule (30 mg total) by mouth daily as needed (ADHD). 11/02/19   Ngetich, Dinah C, NP  chlorthalidone (HYGROTON) 25 MG tablet  TAKE 1 TABLET BY MOUTH EVERY DAY Patient taking differently: Take 25 mg by mouth daily.  11/05/19   Ngetich, Dinah C, NP  citalopram (CELEXA) 20 MG tablet TAKE 1 TABLET BY MOUTH EVERY DAY Patient taking differently: Take 20 mg by mouth daily.  01/14/20   Ngetich, Dinah C, NP  famotidine (PEPCID) 40 MG tablet Take 1 tablet (40 mg total) by mouth 2 (two) times daily. 09/20/19   Cirigliano, Vito V, DO  ferrous sulfate 325 (65 FE) MG tablet Take 325 mg by mouth daily with breakfast.    [provider]  Fluticasone-Salmeterol (ADVAIR DISKUS) 100-50 MCG/DOSE AEPB Inhale 1 puff into the lungs 2 (two) times daily. 11/20/19   Parrett, Fonnie Mu, NP  hyoscyamine (LEVSIN SL) 0.125 MG SL tablet Place 1 tablet (0.125 mg total) under the tongue every 6 (six) hours as needed. Patient taking differently: Place 0.125 mg under the tongue every 6 (six) hours as needed for cramping.  11/05/19   Cirigliano, Vito V, DO  levETIRAcetam (KEPPRA) 500 MG tablet 1/2 tablet twice daily for 1 week and then take 1 full tablet twice daily Patient taking differently: Take 500 mg by mouth at bedtime.  10/15/19   Kathrynn Ducking, MD  meloxicam (MOBIC) 7.5 MG tablet Take 1 tablet (7.5 mg total) by mouth daily. Patient not taking: Reported on 02/04/2020 10/09/19   Nahser, Wonda Cheng, MD  metFORMIN (GLUCOPHAGE) 500 MG tablet Take 500 mg by mouth daily.     [provider]  mometasone-formoterol (DULERA) 100-5 MCG/ACT AERO Inhale 2 puffs into the lungs 2 (two) times daily. 08/10/19   Ngetich, Dinah C, NP  nortriptyline (PAMELOR) 25 MG capsule Take 2 capsules at bedtime. Patient taking differently: Take 50 mg by mouth at bedtime.  12/17/19   Suzzanne Cloud, NP  spironolactone (ALDACTONE) 25 MG tablet TAKE 1 TABLET BY MOUTH EVERY DAY Patient taking differently: Take 25 mg by mouth daily.  11/05/19   Ngetich, Dinah C, NP  sucralfate (CARAFATE) 1 g tablet Take 1 tablet (1 g total) by mouth 2 (two) times daily. 11/14/19    Cirigliano, Vito V, DO  SUMAtriptan (IMITREX) 100 MG tablet TAKE 1 TABLET BY MOUTH 2X DAILY AS NEEDED FOR MIGRAINE. MAY REPEAT IN 2 HOURS IF HEADACHE PERSISTS OR RECURS. Patient taking differently: Take 100 mg by mouth every 2 (two) hours as needed for migraine. May repeat in 2 hours if headache persists or reoccurs 12/11/19   Kathrynn Ducking, MD    Allergies    Aspirin  Review of Systems   Review of Systems  Constitutional: Positive for chills and diaphoresis. Negative for fever.  HENT: Negative for ear pain and sore throat.   Eyes: Negative for visual disturbance.  Respiratory: Negative for cough and shortness of breath.   Cardiovascular: Negative for chest pain.  Gastrointestinal: Positive for abdominal pain, nausea and vomiting. Negative for constipation and diarrhea.  Genitourinary: Positive for frequency. Negative for dysuria and hematuria.  Musculoskeletal: Negative for back pain.  Skin: Negative for rash.  Neurological: Negative for headaches.  All other systems reviewed and are negative.   Physical Exam Updated Vital Signs BP (!) 162/136 (BP Location: Right Arm)   Pulse 73   Temp 99.3 F (37.4 C) (Rectal)   Resp 18   LMP 02/19/2016   SpO2 100%   Physical Exam Vitals and nursing note reviewed.  Constitutional:      General: She is not in acute distress.    Appearance: She is well-developed.  HENT:     Head: Normocephalic and atraumatic.  Eyes:     Conjunctiva/sclera: Conjunctivae normal.  Cardiovascular:     Rate and Rhythm: Regular rhythm. Tachycardia present.     Heart sounds: No murmur.  Pulmonary:     Effort: Pulmonary effort is normal. No respiratory distress.     Breath sounds: Normal breath sounds. No wheezing, rhonchi or rales.  Abdominal:     General: Bowel sounds are decreased.     Palpations: Abdomen is soft.     Tenderness: There is abdominal tenderness in the left upper quadrant and left lower quadrant. There is left CVA tenderness.    Musculoskeletal:     Cervical back: Neck supple.  Skin:    General: Skin is warm and dry.  Neurological:     Mental Status: She is alert.     ED Results / Procedures / Treatments   Labs (all labs ordered are listed, but only abnormal results are displayed) Labs Reviewed  COMPREHENSIVE METABOLIC PANEL - Abnormal; Notable for the following components:      Result Value   Potassium 3.4 (*)    Creatinine, Ser 1.02 (*)    Calcium 8.6 (*)    All other components within normal limits  CBC - Abnormal; Notable for the following components:   Hemoglobin 11.6 (*)    All other components within normal limits  D-DIMER, QUANTITATIVE (NOT AT [facility]) - Abnormal; Notable for the following components:   D-Dimer, Quant 4.27 (*)    All other components within normal limits  RESPIRATORY PANEL BY RT PCR (FLU A&B, COVID)  CULTURE, BLOOD (ROUTINE X 2)  CULTURE, BLOOD (ROUTINE X 2)  LIPASE, BLOOD  LACTIC ACID, PLASMA  URINALYSIS, ROUTINE W REFLEX MICROSCOPIC  HIV ANTIBODY (ROUTINE TESTING W REFLEX)  CBC  CREATININE, SERUM  TROPONIN I (HIGH SENSITIVITY)  TROPONIN I (HIGH SENSITIVITY)    EKG EKG Interpretation  Date/Time:  Sunday Apr 27 2020 09:49:24 EDT Ventricular Rate:  88 PR Interval:    QRS Duration: 89 QT Interval:  373 QTC Calculation: 452 R Axis:   58 Text Interpretation: Sinus rhythm Borderline T abnormalities, anterior leads No STEMI Confirmed by Octaviano Glow [phone]) on 04/27/2020 9:55:55 AM   Radiology CT Angio Chest PE W and/or Wo Contrast  Result Date: 04/27/2020 CLINICAL DATA:  Gastric bypass surgery 04/21/2020. Elevated D-dimer. Intermittent nausea and left lower quadrant pain. EXAM: CT ANGIOGRAPHY CHEST CT ABDOMEN AND PELVIS WITH CONTRAST TECHNIQUE: Multidetector CT imaging of the chest was performed using the standard protocol during bolus administration of intravenous contrast. Multiplanar CT image reconstructions and MIPs were obtained to evaluate the vascular anatomy.  Multidetector CT imaging of the abdomen and pelvis was performed using the standard protocol during bolus administration of intravenous contrast. CONTRAST:  142mL OMNIPAQUE IOHEXOL 350 MG/ML SOLN COMPARISON:  09/14/2019 04/18/2019 CT abdomen/pelvis. 11/12/2014 chest CT angiogram. Chest radiograph. FINDINGS: CTA CHEST FINDINGS Cardiovascular: The study is moderate quality for the evaluation of pulmonary embolism, with some degradation by motion and habitus. There are no filling defects in the central, lobar, segmental or subsegmental pulmonary artery branches to suggest acute pulmonary embolism. Great vessels are normal in course and caliber. Top-normal heart size. No significant pericardial fluid/thickening. Mediastinum/Nodes: No discrete thyroid nodules. Unremarkable esophagus. No pathologically enlarged axillary, mediastinal or hilar lymph nodes. Lungs/Pleura: No pneumothorax. Trace dependent left pleural effusion. No right pleural effusion. No acute consolidative airspace disease, lung masses or significant pulmonary nodules. Musculoskeletal: No aggressive appearing focal osseous lesions. Mild thoracic spondylosis. Review of the MIP images confirms the above findings. CT ABDOMEN and PELVIS FINDINGS Hepatobiliary: Normal liver with no liver mass. Normal gallbladder with no radiopaque cholelithiasis. No biliary ductal dilatation. CBD diameter 5 mm. Pancreas: Normal, with no mass or duct dilation. Spleen: Normal size. No mass. Adrenals/Urinary Tract: Normal adrenals. Normal kidneys with no hydronephrosis and no renal mass. Normal bladder. 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 Gastrojejunostomy appears grossly intact. Excluded distal stomach contains mild fluid. No definite gastric wall thickening or pneumatosis. Enteroenterostomy noted in the right abdomen. No dilated or thick-walled small bowel loops. Normal appendix.  Moderate colorectal stool. No large bowel wall thickening, diverticulosis or pericolonic fat stranding. Vascular/Lymphatic: Atherosclerotic nonaneurysmal abdominal aorta. Patent portal, splenic, hepatic and renal veins. No pathologically enlarged lymph nodes in the abdomen or pelvis. Reproductive: Status post hysterectomy, with no abnormal findings at the vaginal cuff. No adnexal mass. Other: No pneumoperitoneum. No focal fluid collection. Small amount of simple free fluid in the pelvis. Mild subcutaneous fat stranding at the presumed port sites in ventral abdominal wall bilaterally. Musculoskeletal: No aggressive appearing focal osseous lesions. Review of the MIP images confirms the above findings. IMPRESSION: 1. No evidence of pulmonary embolism. Trace dependent left pleural effusion. No active pulmonary disease. 2. 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 No evidence of bowel complication. No free air. No appreciable abscess. 3. Small amount of simple free fluid in the pelvis. 4. Aortic Atherosclerosis (ICD10-I70.0). Electronically Signed   By: Ilona Sorrel M.D.   On: 04/27/2020 11:36   CT ABDOMEN PELVIS W CONTRAST  Result Date: 04/27/2020 CLINICAL DATA:  Gastric bypass surgery 04/21/2020. Elevated D-dimer. Intermittent nausea and left lower quadrant pain. EXAM: CT  ANGIOGRAPHY CHEST CT ABDOMEN AND PELVIS WITH CONTRAST TECHNIQUE: Multidetector CT imaging of the chest was performed using the standard protocol during bolus administration of intravenous contrast. Multiplanar CT image reconstructions and MIPs were obtained to evaluate the vascular anatomy. Multidetector CT imaging of the abdomen and pelvis was performed using the standard protocol during bolus administration of intravenous contrast. CONTRAST:  162mL OMNIPAQUE IOHEXOL 350 MG/ML SOLN COMPARISON:  09/14/2019 04/18/2019 CT abdomen/pelvis. 11/12/2014 chest CT angiogram. Chest  radiograph. FINDINGS: CTA CHEST FINDINGS Cardiovascular: The study is moderate quality for the evaluation of pulmonary embolism, with some degradation by motion and habitus. There are no filling defects in the central, lobar, segmental or subsegmental pulmonary artery branches to suggest acute pulmonary embolism. Great vessels are normal in course and caliber. Top-normal heart size. No significant pericardial fluid/thickening. Mediastinum/Nodes: No discrete thyroid nodules. Unremarkable esophagus. No pathologically enlarged axillary, mediastinal or hilar lymph nodes. Lungs/Pleura: No pneumothorax. Trace dependent left pleural effusion. No right pleural effusion. No acute consolidative airspace disease, lung masses or significant pulmonary nodules. Musculoskeletal: No aggressive appearing focal osseous lesions. Mild thoracic spondylosis. Review of the MIP images confirms the above findings. CT ABDOMEN and PELVIS FINDINGS Hepatobiliary: Normal liver with no liver mass. Normal gallbladder with no radiopaque cholelithiasis. No biliary ductal dilatation. CBD diameter 5 mm. Pancreas: Normal, with no mass or duct dilation. Spleen: Normal size. No mass. Adrenals/Urinary Tract: Normal adrenals. Normal kidneys with no hydronephrosis and no renal mass. Normal bladder. 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 Gastrojejunostomy appears grossly intact. Excluded distal stomach contains mild fluid. No definite gastric wall thickening or pneumatosis. Enteroenterostomy noted in the right abdomen. No dilated or thick-walled small bowel loops. Normal appendix. Moderate colorectal stool. No large bowel wall thickening, diverticulosis or pericolonic fat stranding. Vascular/Lymphatic: Atherosclerotic nonaneurysmal abdominal aorta. Patent portal, splenic, hepatic and renal veins. No pathologically enlarged lymph nodes in the abdomen or pelvis.  Reproductive: Status post hysterectomy, with no abnormal findings at the vaginal cuff. No adnexal mass. Other: No pneumoperitoneum. No focal fluid collection. Small amount of simple free fluid in the pelvis. Mild subcutaneous fat stranding at the presumed port sites in ventral abdominal wall bilaterally. Musculoskeletal: No aggressive appearing focal osseous lesions. Review of the MIP images confirms the above findings. IMPRESSION: 1. No evidence of pulmonary embolism. Trace dependent left pleural effusion. No active pulmonary disease. 2. 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 No evidence of bowel complication. No free air. No appreciable abscess. 3. Small amount of simple free fluid in the pelvis. 4. Aortic Atherosclerosis (ICD10-I70.0). Electronically Signed   By: Ilona Sorrel M.D.   On: 04/27/2020 11:36    Procedures Procedures (including critical care time)  Medications Ordered in ED Medications  sodium chloride flush (NS) 0.9 % injection 3 mL (3 mLs Intravenous Refused 04/27/20 0927)  sodium chloride (PF) 0.9 % injection (has no administration in time range)  acetaZOLAMIDE (DIAMOX) 12 hr capsule 1,000 mg (has no administration in time range)  spironolactone (ALDACTONE) tablet 25 mg (has no administration in time range)  ALPRAZolam (XANAX) tablet 0.5-1 mg (has no administration in time range)  amphetamine-dextroamphetamine (ADDERALL XR) 24 hr capsule 30 mg (has no administration in time range)  citalopram (CELEXA) tablet 20 mg (has no administration in time range)  nortriptyline (PAMELOR) capsule 50 mg (has no administration in time range)  sucralfate (CARAFATE) tablet 1 g (has no administration in time range)  hyoscyamine (LEVSIN SL) SL tablet 0.125 mg (has no administration in time range)  ferrous sulfate tablet 325 mg (has no administration in time range)  levETIRAcetam (KEPPRA) tablet 500 mg (has no administration  in time range)  albuterol (PROVENTIL) (2.5 MG/3ML) 0.083% nebulizer solution 2.5 mg (has no administration in time range)  albuterol (VENTOLIN HFA) 108 (90 Base) MCG/ACT inhaler 1-2 puff (has no administration in time range)  mometasone-formoterol (DULERA) 100-5 MCG/ACT inhaler 2 puff (has no administration in time range)  enoxaparin (LOVENOX) injection 40 mg (has no administration in time range)  acetaminophen (TYLENOL) tablet 650 mg (has no administration in time range)    Or  acetaminophen (TYLENOL) suppository 650 mg (has no administration in time range)  ondansetron (ZOFRAN) tablet 4 mg (has no administration in time range)    Or  ondansetron (ZOFRAN) injection 4 mg (has no administration in time range)  sodium chloride 0.9 % bolus 1,000 mL (1,000 mLs Intravenous New Bag/Given 04/27/20 0918)  morphine 4 MG/ML injection 4 mg (4 mg Intravenous Given 04/27/20 0915)  ondansetron (ZOFRAN) injection 4 mg (4 mg Intravenous Given 04/27/20 0917)  HYDROmorphone (DILAUDID) injection 1 mg (1 mg Intravenous Given 04/27/20 1035)  iohexol (OMNIPAQUE) 350 MG/ML injection 100 mL (100 mLs Intravenous Contrast Given 04/27/20 1050)  HYDROmorphone (DILAUDID) injection 1 mg (1 mg Intravenous Given 04/27/20 1212)  morphine 4 MG/ML injection 4 mg (4 mg Intravenous Given 04/27/20 1317)    ED Course  I have reviewed the triage vital signs and the nursing notes.  Pertinent labs & imaging results that were available during my care of the patient were reviewed by me and considered in my medical decision making (see chart for details).    MDM Rules/Calculators/A&P                      44 year old female with recent Roux-en-Y gastric bypass last week presenting for evaluation of abdominal pain nausea and vomiting.  Also with sweats/chills at home.  She further complains of chest pain, shortness of breath and pleuritic pain.  Initially patient tachycardic, with borderline temperature.  BP stable and normal respiratory status.   We will get labs, EKG. Anticipate further imaging following labwork.  CBC w/o leukocytosis or anemia CMP with mild hypokalemia Lipase neg Dddimer elevated Trop neg x2 Lactic acid neg Blood cultures obtained  EKG Sinus rhythm Borderline T abnormalities, anterior leads No STEMI   CT chest/abd/pelvis with no evidence of pulmonary embolism. Trace dependent left pleural effusion. No active pulmonary disease. 2. 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 No evidence of bowel complication. No free air. No appreciable abscess. 3. Small amount of simple free fluid in the pelvis. 4. Aortic Atherosclerosis.   12:26 PM CONSULT with Dr. Cathlean Sauer with [facility] hospitalist service who recommends contacting [facility] prior to admission.   1:12 PM CONSULT with Dr. Micheline Rough, fellow with [facility]. She states that as long as patient is tolerating PO and she did not have any abnormal CT findings they are comfortable with our plan to admit her here for pain control.   CONSULT with Dr. Cathlean Sauer with hospitalist service who accepts patient for admission.   Final Clinical Impression(s) / ED Diagnoses Final diagnoses:  Post-op pain    Rx / DC Orders ED Discharge Orders    None       Rodney Booze, PA-C 04/27/20 1455    Trifan, Carola Rhine,  MD 04/27/20 1756

## 2020-04-27 NOTE — H&P (Addendum)
History and Physical    Victoria Moore [id number] DOB: 1976-02-15 DOA: 04/27/2020  PCP: [name], MD   Patient coming from: Home   Chief Complaint: abdominal pain.   HPI: Victoria Moore is a 44 y.o. female with medical history significant of chronic anemia, chronic osteo-arthritis, hypertension, type 2 diabetes mellitus, asthma, obesity and depression. Patient was discharged from [facility] on April 29 after a laparoscopic gastric bypass surgery.  Apparently patient had persistent abdominal pain postoperatively, at her time of discharge her pain was down to 8 out of 10 in intensity, she was discharged on a clear liquid diet, and she told not to expect a formed bowel movement for the next 5 to 7 days.  She received MiraLAX for bowel regimen.  At home her pain increased up to a 10 out of 10, mainly at the left lower abdominal region, it was constant, dull in nature, no improving or worsening factors.  Associated with postprandial vomiting and decreased appetite.  Her oral antianalgesics were not effective in relieving her pain.  Patient unable to tolerate clear liquid diet.   ED Course: Patient was noted to be in persistent and severe pain, couple rounds of IV morphine and hydromorphone, along with Zofran with persistent abdominal pain and unable to tolerate a clear liquid diet.  Abdominal CT with no acute changes.  [facility] was contacted, recommended to admit to [facility] for [employer].  Patient is considered failed outpatient therapy at this point.  Review of Systems:  1. General: No fevers, no chills, no weight gain or weight loss, decreased appetite and poor oral intake.  2. ENT: No runny nose or sore throat, no hearing disturbances 3. Pulmonary: No dyspnea, cough, wheezing, or hemoptysis 4. Cardiovascular: No angina, claudication, lower extremity edema, pnd or orthopnea 5. Gastrointestinal: positive nausea or vomiting, but no diarrhea or  constipation 6. Hematology: No easy bruisability or frequent infections 7. Urology: No dysuria, hematuria or increased urinary frequency 8. Dermatology: No rashes. 9. Neurology: No seizures or paresthesias 10. Musculoskeletal: No joint pain or deformities  Past Medical History:  Diagnosis Date  . Acute low back pain without sciatica 06/22/2018  . ADHD   . Allergy   . Anemia   . Anxiety   . Arthritis   . 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 Fibromyalgia, doubt sarcoid   . Asthma   . Blood transfusion without reported diagnosis   . Chest wall pain 05/26/2018  . Chronic kidney disease   . Chronic low back pain with sciatica 09/15/2018  . Common migraine with intractable migraine 10/07/2016  . Cough variant asthma vs UACS  10/23/2016   - Spirometry 04/15/2016  Very truncated exp loop effort dep portion only  - Allergy profile 10/22/2016 >  Eos 0.2/  IgE  52 RAST POS grass/trees/ragweed  10/22/2016  After extensive coaching HFA effectiveness =    75% try duelra 100 2bid > improved  . Daytime sleepiness 05/26/2018  . Decreased pedal pulses 10/04/2018  . Depression   . Depression, major, single episode, moderate ([place]) 05/26/2018  . Diabetes ([place])   . Dyspnea 04/15/2016   04/15/2016  Walked RA x 3 laps @ 185 ft each stopped due to  End of study, nl pace, no sob or desat    - Spirometry 04/15/2016  Very truncated exp loop effort dep portion only  - 10/22/2016  Walked RA x 3 laps @ 185 ft  each stopped due to  End of study, slow pace, min sob/ no desat - full pfts rec 10/22/2016 >>>      . Edema   . Essential hypertension, benign 11/15/2016  . Fibroids 03/11/2016  . Frequency of urination 09/11/2018  . GERD (gastroesophageal reflux disease)   . Herniated lumbar intervertebral disc   . Hiatal hernia   . Hilar adenopathy   . Hypertension   . Hypokalemia 06/28/2018  . Impaired fasting blood sugar 09/11/2018  . Iron deficiency   . Leg pain 12/07/2018    . Low libido 11/27/2018  . Migraine   . Mild sleep apnea 08/03/2018   HST 06/20/18  AHI  8.1 / snoring with 02 nadir 80% >  08/03/2018 rec sleep medicine consultation   . Morbid (severe) obesity due to excess calories ([place]) 10/23/2016  . Personal history of sarcoidosis 05/26/2018  . Prolonged capillary refill time   . Right leg swelling 08/18/2018  . Sarcoidosis    personal history of  . Seizures ([place])    history of   . Sleep apnea   . Spondylosis   . Vitamin D deficiency     Past Surgical History:  Procedure Laterality Date  . ABDOMINAL HYSTERECTOMY    . ESOPHAGEAL MANOMETRY N/A 09/05/2019   Procedure: ESOPHAGEAL MANOMETRY (EM);  Surgeon: Lavena Bullion, DO;  Location: [facility];  Service: Gastroenterology;  Laterality: N/A;  . LAPAROSCOPIC OVARIAN CYSTECTOMY Left 03/11/2016   Procedure: LAPAROSCOPIC OVARIAN CYSTECTOMY;  Surgeon: Eldred Manges, MD;  Location: [facility];  Service: Gynecology;  Laterality: Left;  . LAPAROSCOPIC VAGINAL HYSTERECTOMY WITH SALPINGECTOMY Bilateral 03/11/2016   Procedure: LAPAROSCOPIC ASSISTED VAGINAL HYSTERECTOMY WITH SALPINGECTOMY;  Surgeon: Eldred Manges, MD;  Location: [facility];  Service: Gynecology;  Laterality: Bilateral;  . [place] IMPEDANCE STUDY  09/05/2019   Procedure: [place] IMPEDANCE STUDY;  Surgeon: Lavena Bullion, DO;  Location: [facility];  Service: Gastroenterology;;  . TUBAL LIGATION    . UPPER GASTROINTESTINAL ENDOSCOPY  10/11/2019   06/2019     reports that she quit smoking about 7 years ago. Her smoking use included cigarettes. She has a 4.25 pack-year smoking history. She has never used smokeless tobacco. She reports previous drug use. Drug: Marijuana. She reports that she does not drink alcohol.  Allergies  Allergen Reactions  . Aspirin Anaphylaxis and Hives         Family History  Adopted: Yes  Problem Relation Age of Onset  . Other Son        Growing pains  . Asthma Mother   . Heart Problems Mother   . Migraines Mother   .  COPD Mother   . Heart attack Mother   . Diabetes Father   . Peptic Ulcer Father   . Heart Problems Father   . COPD Father   . Heart attack Father   . Colon cancer Neg Hx   . Colon polyps Neg Hx   . Esophageal cancer Neg Hx   . Rectal cancer Neg Hx   . Stomach cancer Neg Hx      Prior to Admission medications   Medication Sig Start Date End Date Taking? Authorizing Provider  acetaZOLAMIDE (DIAMOX) 500 MG capsule Take 2 capsules (1,000 mg total) by mouth 2 (two) times daily. 01/28/20   Kathrynn Ducking, MD  albuterol (PROVENTIL) (2.5 MG/3ML) 0.083% nebulizer solution Take 2.5 mg by nebulization every 6 (six) hours as needed for wheezing or shortness of breath.    [provider]  albuterol (VENTOLIN HFA) 108 (90 Base) MCG/ACT inhaler Inhale 1-2 puffs into the lungs every 4 (four) hours as needed for wheezing or shortness of breath. 01/14/20   Parrett, Fonnie Mu, NP  ALPRAZolam Duanne Moron) 1 MG tablet Take 0.5-1 tablets (0.5-1 mg total) by mouth at bedtime as needed for anxiety. 12/05/19   Ngetich, Dinah C, NP  amphetamine-dextroamphetamine (ADDERALL XR) 30 MG 24 hr capsule Take 1 capsule (30 mg total) by mouth daily as needed (ADHD). 11/02/19   Ngetich, Dinah C, NP  chlorthalidone (HYGROTON) 25 MG tablet TAKE 1 TABLET BY MOUTH EVERY DAY Patient taking differently: Take 25 mg by mouth daily.  11/05/19   Ngetich, Dinah C, NP  citalopram (CELEXA) 20 MG tablet TAKE 1 TABLET BY MOUTH EVERY DAY Patient taking differently: Take 20 mg by mouth daily.  01/14/20   Ngetich, Dinah C, NP  famotidine (PEPCID) 40 MG tablet Take 1 tablet (40 mg total) by mouth 2 (two) times daily. 09/20/19   Cirigliano, Vito V, DO  ferrous sulfate 325 (65 FE) MG tablet Take 325 mg by mouth daily with breakfast.    [provider]  Fluticasone-Salmeterol (ADVAIR DISKUS) 100-50 MCG/DOSE AEPB Inhale 1 puff into the lungs 2 (two) times daily. 11/20/19   Parrett, Fonnie Mu, NP  hyoscyamine (LEVSIN SL) 0.125 MG SL tablet  Place 1 tablet (0.125 mg total) under the tongue every 6 (six) hours as needed. Patient taking differently: Place 0.125 mg under the tongue every 6 (six) hours as needed for cramping.  11/05/19   Cirigliano, Vito V, DO  levETIRAcetam (KEPPRA) 500 MG tablet 1/2 tablet twice daily for 1 week and then take 1 full tablet twice daily Patient taking differently: Take 500 mg by mouth at bedtime.  10/15/19   Kathrynn Ducking, MD  meloxicam (MOBIC) 7.5 MG tablet Take 1 tablet (7.5 mg total) by mouth daily. Patient not taking: Reported on 02/04/2020 10/09/19   Nahser, Wonda Cheng, MD  metFORMIN (GLUCOPHAGE) 500 MG tablet Take 500 mg by mouth daily.     [provider]  mometasone-formoterol (DULERA) 100-5 MCG/ACT AERO Inhale 2 puffs into the lungs 2 (two) times daily. 08/10/19   Ngetich, Dinah C, NP  nortriptyline (PAMELOR) 25 MG capsule Take 2 capsules at bedtime. Patient taking differently: Take 50 mg by mouth at bedtime.  12/17/19   Suzzanne Cloud, NP  spironolactone (ALDACTONE) 25 MG tablet TAKE 1 TABLET BY MOUTH EVERY DAY Patient taking differently: Take 25 mg by mouth daily.  11/05/19   Ngetich, Dinah C, NP  sucralfate (CARAFATE) 1 g tablet Take 1 tablet (1 g total) by mouth 2 (two) times daily. 11/14/19   Cirigliano, Vito V, DO  SUMAtriptan (IMITREX) 100 MG tablet TAKE 1 TABLET BY MOUTH 2X DAILY AS NEEDED FOR MIGRAINE. MAY REPEAT IN 2 HOURS IF HEADACHE PERSISTS OR RECURS. Patient taking differently: Take 100 mg by mouth every 2 (two) hours as needed for migraine. May repeat in 2 hours if headache persists or reoccurs 12/11/19   Kathrynn Ducking, MD    Physical Exam: Vitals:   04/27/20 1345 04/27/20 1400 04/27/20 1415 04/27/20 1450  BP: 116/82 (!) 117/98 (!) 117/93 (!) 162/136  Pulse: 95 79 76 73  Resp: 17 15 14 18   Temp:      TempSrc:      SpO2: 100% 94% 91% 100%    Vitals:   04/27/20 1345 04/27/20 1400 04/27/20 1415 04/27/20 1450  BP: 116/82 (!) 117/98 (!) 117/93 Marland Kitchen)  162/136  Pulse:  95 79 76 73  Resp: 17 15 14 18   Temp:      TempSrc:      SpO2: 100% 94% 91% 100%   General: deconditioned and in pain.  Neurology: Awake and alert, non focal Head and Neck. Head normocephalic. Neck supple with no adenopathy or thyromegaly.   E ENT: no pallor, no icterus, oral mucosa dry.  Cardiovascular: No JVD. S1-S2 present, rhythmic, no gallops, rubs, or murmurs. No lower extremity edema. Pulmonary: positive breath sounds bilaterally, adequate air movement, no wheezing, rhonchi or rales. Gastrointestinal. Abdomen protuberant with no organomegaly, mild tender to palpation at the left lower quadrant, no rebound or guarding. Skin. No rashes/ surgical wounds clean (laparascopic procedure)  Musculoskeletal: no joint deformities    Labs on Admission: I have personally reviewed following labs and imaging studies  CBC: Recent Labs  Lab 04/27/20 0829  WBC 6.6  HGB 11.6*  HCT 37.0  MCV 87.9  PLT [id number]   Basic Metabolic Panel: Recent Labs  Lab 04/27/20 0829  NA 137  K 3.4*  CL 101  CO2 28  GLUCOSE 86  BUN 11  CREATININE 1.02*  CALCIUM 8.6*   GFR: CrCl cannot be calculated (Unknown ideal weight.). Liver Function Tests: Recent Labs  Lab 04/27/20 0829  AST 19  ALT 17  ALKPHOS 57  BILITOT 0.6  PROT 7.3  ALBUMIN 3.8   Recent Labs  Lab 04/27/20 0829  LIPASE 21   No results for input(s): AMMONIA in the last 168 hours. Coagulation Profile: No results for input(s): INR, PROTIME in the last 168 hours. Cardiac Enzymes: No results for input(s): CKTOTAL, CKMB, CKMBINDEX, TROPONINI in the last 168 hours. BNP (last 3 results) No results for input(s): PROBNP in the last 8760 hours. HbA1C: No results for input(s): HGBA1C in the last 72 hours. CBG: No results for input(s): GLUCAP in the last 168 hours. Lipid Profile: No results for input(s): CHOL, HDL, LDLCALC, TRIG, CHOLHDL, LDLDIRECT in the last 72 hours. Thyroid Function Tests: No results for input(s): TSH, T4TOTAL,  FREET4, T3FREE, THYROIDAB in the last 72 hours. Anemia Panel: No results for input(s): VITAMINB12, FOLATE, FERRITIN, TIBC, IRON, RETICCTPCT in the last 72 hours. Urine analysis:    Component Value Date/Time   COLORURINE AMBER (A) 04/19/2019 2117   APPEARANCEUR CLOUDY (A) 04/19/2019 2117   LABSPEC 1.020 07/15/2019 1213   LABSPEC 1.015 11/27/2018 1008   PHURINE 7.5 07/15/2019 1213   GLUCOSEU NEGATIVE 07/15/2019 1213   HGBUR NEGATIVE 07/15/2019 1213   BILIRUBINUR moderate 11/07/2019 1619   KETONESUR NEGATIVE 07/15/2019 1213   PROTEINUR Positive (A) 11/07/2019 1619   PROTEINUR NEGATIVE 07/15/2019 1213   UROBILINOGEN 0.2 11/07/2019 1619   UROBILINOGEN 0.2 07/15/2019 1213   NITRITE negative 11/07/2019 1619   NITRITE NEGATIVE 07/15/2019 1213   LEUKOCYTESUR Moderate (2+) (A) 11/07/2019 1619   LEUKOCYTESUR TRACE (A) 07/15/2019 1213    Radiological Exams on Admission: CT Angio Chest PE W and/or Wo Contrast  Result Date: 04/27/2020 CLINICAL DATA:  Gastric bypass surgery 04/21/2020. Elevated D-dimer. Intermittent nausea and left lower quadrant pain. EXAM: CT ANGIOGRAPHY CHEST CT ABDOMEN AND PELVIS WITH CONTRAST TECHNIQUE: Multidetector CT imaging of the chest was performed using the standard protocol during bolus administration of intravenous contrast. Multiplanar CT image reconstructions and MIPs were obtained to evaluate the vascular anatomy. Multidetector CT imaging of the abdomen and pelvis was performed using the standard protocol during bolus administration of intravenous contrast. CONTRAST:  133mL OMNIPAQUE IOHEXOL 350 MG/ML SOLN  COMPARISON:  09/14/2019 04/18/2019 CT abdomen/pelvis. 11/12/2014 chest CT angiogram. Chest radiograph. FINDINGS: CTA CHEST FINDINGS Cardiovascular: The study is moderate quality for the evaluation of pulmonary embolism, with some degradation by motion and habitus. There are no filling defects in the central, lobar, segmental or subsegmental pulmonary artery branches to  suggest acute pulmonary embolism. Great vessels are normal in course and caliber. Top-normal heart size. No significant pericardial fluid/thickening. Mediastinum/Nodes: No discrete thyroid nodules. Unremarkable esophagus. No pathologically enlarged axillary, mediastinal or hilar lymph nodes. Lungs/Pleura: No pneumothorax. Trace dependent left pleural effusion. No right pleural effusion. No acute consolidative airspace disease, lung masses or significant pulmonary nodules. Musculoskeletal: No aggressive appearing focal osseous lesions. Mild thoracic spondylosis. Review of the MIP images confirms the above findings. CT ABDOMEN and PELVIS FINDINGS Hepatobiliary: Normal liver with no liver mass. Normal gallbladder with no radiopaque cholelithiasis. No biliary ductal dilatation. CBD diameter 5 mm. Pancreas: Normal, with no mass or duct dilation. Spleen: Normal size. No mass. Adrenals/Urinary Tract: Normal adrenals. Normal kidneys with no hydronephrosis and no renal mass. Normal bladder. 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 Gastrojejunostomy appears grossly intact. Excluded distal stomach contains mild fluid. No definite gastric wall thickening or pneumatosis. Enteroenterostomy noted in the right abdomen. No dilated or thick-walled small bowel loops. Normal appendix. Moderate colorectal stool. No large bowel wall thickening, diverticulosis or pericolonic fat stranding. Vascular/Lymphatic: Atherosclerotic nonaneurysmal abdominal aorta. Patent portal, splenic, hepatic and renal veins. No pathologically enlarged lymph nodes in the abdomen or pelvis. Reproductive: Status post hysterectomy, with no abnormal findings at the vaginal cuff. No adnexal mass. Other: No pneumoperitoneum. No focal fluid collection. Small amount of simple free fluid in the pelvis. Mild subcutaneous fat stranding at the presumed port sites in ventral abdominal  wall bilaterally. Musculoskeletal: No aggressive appearing focal osseous lesions. Review of the MIP images confirms the above findings. IMPRESSION: 1. No evidence of pulmonary embolism. Trace dependent left pleural effusion. No active pulmonary disease. 2. 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 No evidence of bowel complication. No free air. No appreciable abscess. 3. Small amount of simple free fluid in the pelvis. 4. Aortic Atherosclerosis (ICD10-I70.0). Electronically Signed   By: Ilona Sorrel M.D.   On: 04/27/2020 11:36   CT ABDOMEN PELVIS W CONTRAST  Result Date: 04/27/2020 CLINICAL DATA:  Gastric bypass surgery 04/21/2020. Elevated D-dimer. Intermittent nausea and left lower quadrant pain. EXAM: CT ANGIOGRAPHY CHEST CT ABDOMEN AND PELVIS WITH CONTRAST TECHNIQUE: Multidetector CT imaging of the chest was performed using the standard protocol during bolus administration of intravenous contrast. Multiplanar CT image reconstructions and MIPs were obtained to evaluate the vascular anatomy. Multidetector CT imaging of the abdomen and pelvis was performed using the standard protocol during bolus administration of intravenous contrast. CONTRAST:  159mL OMNIPAQUE IOHEXOL 350 MG/ML SOLN COMPARISON:  09/14/2019 04/18/2019 CT abdomen/pelvis. 11/12/2014 chest CT angiogram. Chest radiograph. FINDINGS: CTA CHEST FINDINGS Cardiovascular: The study is moderate quality for the evaluation of pulmonary embolism, with some degradation by motion and habitus. There are no filling defects in the central, lobar, segmental or subsegmental pulmonary artery branches to suggest acute pulmonary embolism. Great vessels are normal in course and caliber. Top-normal heart size. No significant pericardial fluid/thickening. Mediastinum/Nodes: No discrete thyroid nodules. Unremarkable esophagus. No pathologically enlarged axillary, mediastinal or hilar lymph nodes.  Lungs/Pleura: No pneumothorax. Trace dependent left pleural effusion. No right pleural effusion. No acute consolidative airspace disease,  lung masses or significant pulmonary nodules. Musculoskeletal: No aggressive appearing focal osseous lesions. Mild thoracic spondylosis. Review of the MIP images confirms the above findings. CT ABDOMEN and PELVIS FINDINGS Hepatobiliary: Normal liver with no liver mass. Normal gallbladder with no radiopaque cholelithiasis. No biliary ductal dilatation. CBD diameter 5 mm. Pancreas: Normal, with no mass or duct dilation. Spleen: Normal size. No mass. Adrenals/Urinary Tract: Normal adrenals. Normal kidneys with no hydronephrosis and no renal mass. Normal bladder. 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 Gastrojejunostomy appears grossly intact. Excluded distal stomach contains mild fluid. No definite gastric wall thickening or pneumatosis. Enteroenterostomy noted in the right abdomen. No dilated or thick-walled small bowel loops. Normal appendix. Moderate colorectal stool. No large bowel wall thickening, diverticulosis or pericolonic fat stranding. Vascular/Lymphatic: Atherosclerotic nonaneurysmal abdominal aorta. Patent portal, splenic, hepatic and renal veins. No pathologically enlarged lymph nodes in the abdomen or pelvis. Reproductive: Status post hysterectomy, with no abnormal findings at the vaginal cuff. No adnexal mass. Other: No pneumoperitoneum. No focal fluid collection. Small amount of simple free fluid in the pelvis. Mild subcutaneous fat stranding at the presumed port sites in ventral abdominal wall bilaterally. Musculoskeletal: No aggressive appearing focal osseous lesions. Review of the MIP images confirms the above findings. IMPRESSION: 1. No evidence of pulmonary embolism. Trace dependent left pleural effusion. No active pulmonary disease. 2. Postsurgical changes from Roux-en-Y  gastric bypass surgery, suboptimally evaluated in the absence of oral contrast. No evidence of bowel complication. No free air. No appreciable abscess. 3. Small amount of simple free fluid in the pelvis. 4. Aortic Atherosclerosis (ICD10-I70.0). Electronically Signed   By: Ilona Sorrel M.D.   On: 04/27/2020 11:36    EKG: Independently reviewed.  EKG 88 bpm, normal axis and normal intervals, sinus rhythm, no ST segment or T wave changes.  Assessment/Plan Principal Problem:   Intractable abdominal pain Active Problems:   Anemia   Iron deficiency   Seizures (HCC)   Morbid (severe) obesity due to excess calories (HCC)   GERD (gastroesophageal reflux disease)   Sarcoidosis   44 year old female recently discharged from [facility] 3 days ago after a gastric bypass surgery, laparoscopic procedure.  Who presents with intractable nausea, vomiting and unable to tolerate clear liquid diet.  On her physical examination her blood pressure is 117/93, heart rate is 76, respiratory rate 14, oxygen saturation 100% on room air.  She has dry mucous membranes, lungs clear to auscultation bilaterally, heart S1-S2 present and rhythmic, abdomen soft, tender to palpation left lower quadrant, no rebound or guarding. 137, potassium 3.4, chloride 101, bicarb 28, glucose 86, BUN 11, creatinine 1.0, lipase 21, AST 19, ALT 17, white count 6.6, hemoglobin 11.6, hematocrit 37.0, platelets 250.  D-dimer 4,27.  SARS COVID-19 negative.  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   Patient will be admitted to the hospital with a working diagnosis of intractable nausea and vomiting post surgical gastric bypass, failed outpatient therapy.  1.  Intractable nausea and vomiting, in the setting of recent gastric bypass/obesity stage 3.  Patient will be admitted to the medical ward,  continue hydration with lactated Ringer's at 75 mL/h. Continue antiacid therapy and a clear liquid diet, which can be advanced as tolerated.  Will order metoclopramide to use 3 times daily with meals.  As needed Zofran and morphine for  nausea and pain control.  2.  Hypertension.  Will continue spironolactone for blood pressure control.  Hold on acetazolamide for now to prevent electrolyte and acid base complications.  3.  GERD.  Continue sucralfate with meals and will add pantoprazole for acid suppressive therapy.  4.  Depression/ seizures.  Continue nortriptyline, citalopram, alprazolam, Adderall and Keppra.  5.  Asthma.  Exacerbation, continue albuterol and Dulera.  6.  Iron deficiency anemia.  Continue iron supplements.  Status is: Inpatient  Remains inpatient appropriate because:IV treatments appropriate due to intensity of illness or inability to take PO   Dispo: The patient is from: Home              Anticipated d/c is to: Home              Anticipated d/c date is: 3 days              Patient currently is not medically stable to d/c.    DVT prophylaxis: Enoxaparin   Code Status:   Full code  Family Communication:   no family at the bedside    Consults called:  None   Admission status:  Inpatient    Victoria Moore Gerome Apley MD [facility]   04/27/2020, 2:57 PM

## 2020-04-27 NOTE — Consult Note (Signed)
Paged On Call DR Alessandra Grout with General Surgery at [facility] [phone]

## 2020-04-28 ENCOUNTER — Inpatient Hospital Stay (HOSPITAL_COMMUNITY): Payer: 59

## 2020-04-28 LAB — BASIC METABOLIC PANEL
Anion gap: 8 (ref 5–15)
BUN: 8 mg/dL (ref 6–20)
CO2: 26 mmol/L (ref 22–32)
Calcium: 8.3 mg/dL — ABNORMAL LOW (ref 8.9–10.3)
Chloride: 103 mmol/L (ref 98–111)
Creatinine, Ser: 0.84 mg/dL (ref 0.44–1.00)
GFR calc Af Amer: >60 mL/min (ref >60)
GFR calc non Af Amer: >60 mL/min (ref >60)
Glucose, Bld: 115 mg/dL — ABNORMAL HIGH (ref 70–99)
Potassium: 3.6 mmol/L (ref 3.5–5.1)
Sodium: 137 mmol/L (ref 135–145)

## 2020-04-28 LAB — CBC
HCT: 32.1 % — ABNORMAL LOW (ref 36.0–46.0)
Hemoglobin: 10.1 g/dL — ABNORMAL LOW (ref 12.0–15.0)
MCH: 27.5 pg (ref 26.0–34.0)
MCHC: 31.5 g/dL (ref 30.0–36.0)
MCV: 87.5 fL (ref 80.0–100.0)
Platelets: 231 10*3/uL (ref 150–400)
RBC: 3.67 MIL/uL — ABNORMAL LOW (ref 3.87–5.11)
RDW: 13.3 % (ref 11.5–15.5)
WBC: 5.9 10*3/uL (ref 4.0–10.5)
nRBC: 0 % (ref 0.0–0.2)

## 2020-04-28 LAB — HEMOGLOBIN A1C
Hgb A1c MFr Bld: 6 % — ABNORMAL HIGH (ref 4.8–5.6)
Mean Plasma Glucose: 125.5 mg/dL

## 2020-04-28 LAB — GLUCOSE, CAPILLARY
Glucose-Capillary: 87 mg/dL (ref 70–99)
Glucose-Capillary: 85 mg/dL (ref 70–99)

## 2020-04-28 MED ORDER — MORPHINE SULFATE (PF) 2 MG/ML IV SOLN
2.0000 mg | INTRAVENOUS | Status: DC | PRN
  Administered 2020-04-28 – 2020-04-29 (×7): 2 mg via INTRAVENOUS
  Filled 2020-04-28 (×7): qty 1

## 2020-04-28 MED ORDER — LIP MEDEX EX OINT
TOPICAL_OINTMENT | CUTANEOUS | Status: AC
  Filled 2020-04-28: qty 7

## 2020-04-28 MED ORDER — INSULIN ASPART 100 UNIT/ML ~~LOC~~ SOLN: 0.0000 [IU] | Freq: Every day | SUBCUTANEOUS | Status: DC

## 2020-04-28 MED ORDER — LACTATED RINGERS IV SOLN: INTRAVENOUS | Status: DC

## 2020-04-28 MED ORDER — MAGNESIUM HYDROXIDE 400 MG/5ML PO SUSP: 15.0000 mL | Freq: Every day | ORAL | Status: DC | PRN

## 2020-04-28 MED ORDER — IOHEXOL 300 MG/ML  SOLN
150.0000 mL | Freq: Once | INTRAMUSCULAR | Status: AC | PRN
  Administered 2020-04-28: 150 mL via ORAL

## 2020-04-28 MED ORDER — PANTOPRAZOLE SODIUM 40 MG PO TBEC
40.0000 mg | DELAYED_RELEASE_TABLET | Freq: Every day | ORAL | Status: DC
  Administered 2020-04-28 – 2020-04-30 (×3): 40 mg via ORAL
  Filled 2020-04-28 (×4): qty 1

## 2020-04-28 MED ORDER — INSULIN ASPART 100 UNIT/ML ~~LOC~~ SOLN: 0.0000 [IU] | Freq: Three times a day (TID) | SUBCUTANEOUS | Status: DC

## 2020-04-28 MED ORDER — POLYETHYLENE GLYCOL 3350 17 G PO PACK
17.0000 g | PACK | Freq: Every day | ORAL | Status: DC
  Administered 2020-04-28 – 2020-04-30 (×3): 17 g via ORAL
  Filled 2020-04-28 (×3): qty 1

## 2020-04-28 MED ORDER — METOCLOPRAMIDE HCL 5 MG PO TABS
5.0000 mg | ORAL_TABLET | Freq: Three times a day (TID) | ORAL | Status: DC
  Administered 2020-04-28 – 2020-04-30 (×5): 5 mg via ORAL
  Filled 2020-04-28 (×7): qty 1

## 2020-04-28 MED ORDER — BISACODYL 10 MG RE SUPP: 10.0000 mg | Freq: Every day | RECTAL | Status: DC | PRN

## 2020-04-28 NOTE — Progress Notes (Signed)
PROGRESS NOTE    Victoria Moore    Code Status: Full Code  [id number] DOB: 1976/02/01 DOA: 04/27/2020 LOS: 1 days  PCP: [name], MD CC:  Chief Complaint  Patient presents with  . Abdominal Pain       Hospital Summary   This is a 44 year old female with a history of anemia, OSA, hypertension, type 2 diabetes, asthma, obesity s/p recent Roux-en-Y bypass surgery (04/21/20 with Dr. Andrey Farmer at [facility]) who was discharged home on 4/29 with persistent abdominal pain which acutely became worse over the weekend with intractable nausea/vomiting which prompted her to present to the ED. In the ED patient underwent CT abdomen/pelvis which showed no acute changes. CTA negative for PE. WBC 6.6, lactic acid 0.8, Cr 1.02, afebrile, all VSS. ED physician contacted [facility] and spoke with a surgical fellow who recommended to admit to [facility] for medical management.  5/3: still pain and poor PO intake.. General Surgery consulted   A & P   Principal Problem:   Intractable abdominal pain Active Problems:   Anemia   Iron deficiency   Seizures (HCC)   Morbid (severe) obesity due to excess calories (HCC)   GERD (gastroesophageal reflux disease)   Sarcoidosis   1. Abdominal pain with poor PO intake and intractable nausea/vomiting s/p recent laparoscopic gastric bypass surgery 04/21/2020 by Dr. Andrey Farmer at [facility] a. CT on admission without any acute postsurgical complications b. Still focally tender on the left abdomen c. General surgery consulted: N.p.o. for now with IV fluids and UGI ordered for further evaluation to rule out leak d. Continue morphine and as needed Zofran and MiraLAX for constipation  2. Hypertension a. On home spironolactone b. Acetazolamide held  3. GERD a. On Carafate and Protonix  4. Depression/seizures a. Continue home nortriptyline, citalopram, alprazolam, Adderall and Keppra  5. Asthma not in acute exacerbation a. Continue albuterol and  Dulera  6. Iron deficiency anemia on supplements   DVT prophylaxis: Lovenox Family Communication: Patient updated Disposition Plan:  Status is: Inpatient  Remains inpatient appropriate because:Ongoing diagnostic testing needed not appropriate for outpatient work up   Dispo: The patient is from: Home              Anticipated d/c is to: Home              Anticipated d/c date is: 2 days              Patient currently is not medically stable to d/c.           Pressure injury documentation    None  Consultants  General surgery   Procedures  None  Antibiotics   Anti-infectives (From admission, onward)   None        Subjective   Patient states her abdominal pain is localized on the left side and rated 9/10 which is slightly improved from presentation.  Has not had a bowel movement today.  Has had poor p.o. intake.  Nausea and vomiting have improved.  Otherwise no issues  Objective   Vitals:   04/28/20 0237 04/28/20 0545 04/28/20 0730 04/28/20 0923  BP: 120/65 112/73  120/60  Pulse: 70 66  77  Resp: 18 18  17   Temp: 98.2 F (36.8 C) 98.2 F (36.8 C)  98.6 F (37 C)  TempSrc: Oral Oral  Oral  SpO2: 97% 98% 96% 98%  Weight:      Height:        Intake/Output Summary (Last 24 hours) at  04/28/2020 1331 Last data filed at 04/28/2020 1252 Gross per 24 hour  Intake 1247.32 ml  Output 1950 ml  Net -702.68 ml   Filed Weights   04/27/20 1600  Weight: (!) 143.8 kg    Examination:  Physical Exam Vitals and nursing note reviewed.  Constitutional:      General: She is not in acute distress.    Appearance: Normal appearance. She is obese.  HENT:     Head: Normocephalic and atraumatic.  Eyes:     Conjunctiva/sclera: Conjunctivae normal.  Cardiovascular:     Rate and Rhythm: Normal rate and regular rhythm.  Pulmonary:     Effort: Pulmonary effort is normal.     Breath sounds: Normal breath sounds.  Abdominal:     Palpations: Abdomen is soft.      Tenderness: There is abdominal tenderness in the left upper quadrant.  Musculoskeletal:        General: No swelling or tenderness.  Skin:    Coloration: Skin is not jaundiced or pale.  Neurological:     Mental Status: She is alert. Mental status is at baseline.  Psychiatric:        Mood and Affect: Mood normal.        Behavior: Behavior normal.     Data Reviewed: I have personally reviewed following labs and imaging studies  CBC: Recent Labs  Lab 04/27/20 0829 04/28/20 0317  WBC 6.6 5.9  HGB 11.6* 10.1*  HCT 37.0 32.1*  MCV 87.9 87.5  PLT 250 [id number]   Basic Metabolic Panel: Recent Labs  Lab 04/27/20 0829 04/28/20 0317  NA 137 137  K 3.4* 3.6  CL 101 103  CO2 28 26  GLUCOSE 86 115*  BUN 11 8  CREATININE 1.02* 0.84  CALCIUM 8.6* 8.3*   GFR: Estimated Creatinine Clearance: 128.4 mL/min (by C-G formula based on SCr of 0.84 mg/dL). Liver Function Tests: Recent Labs  Lab 04/27/20 0829  AST 19  ALT 17  ALKPHOS 57  BILITOT 0.6  PROT 7.3  ALBUMIN 3.8   Recent Labs  Lab 04/27/20 0829  LIPASE 21   No results for input(s): AMMONIA in the last 168 hours. Coagulation Profile: No results for input(s): INR, PROTIME in the last 168 hours. Cardiac Enzymes: No results for input(s): CKTOTAL, CKMB, CKMBINDEX, TROPONINI in the last 168 hours. BNP (last 3 results) No results for input(s): PROBNP in the last 8760 hours. HbA1C: Recent Labs    04/28/20 0317  HGBA1C 6.0*   CBG: No results for input(s): GLUCAP in the last 168 hours. Lipid Profile: No results for input(s): CHOL, HDL, LDLCALC, TRIG, CHOLHDL, LDLDIRECT in the last 72 hours. Thyroid Function Tests: No results for input(s): TSH, T4TOTAL, FREET4, T3FREE, THYROIDAB in the last 72 hours. Anemia Panel: No results for input(s): VITAMINB12, FOLATE, FERRITIN, TIBC, IRON, RETICCTPCT in the last 72 hours. Sepsis Labs: Recent Labs  Lab 04/27/20 0914  LATICACIDVEN 0.8    Recent Results (from the past 240  hour(s))  Blood culture (routine x 2)     Status: None (Preliminary result)   Collection Time: 04/27/20  9:14 AM   Specimen: BLOOD LEFT FOREARM  Result Value Ref Range Status   Specimen Description   Final    BLOOD LEFT FOREARM Performed at [facility] [address] [place], [place] [address]    Special Requests   Final    BOTTLES DRAWN AEROBIC AND ANAEROBIC Blood Culture adequate volume Performed at [facility]  [address] [place], [place] [address]    Culture   Final    NO GROWTH < 24 HOURS Performed at [facility] [address], [place], [place] [address]    Report Status PENDING  Incomplete  Respiratory Panel by RT PCR (Flu A&B, Covid) - Nasopharyngeal Swab     Status: None   Collection Time: 04/27/20 12:22 PM   Specimen: Nasopharyngeal Swab  Result Value Ref Range Status   SARS Coronavirus 2 by RT PCR NEGATIVE NEGATIVE Final    Comment: (NOTE) SARS-CoV-2 target nucleic acids are NOT DETECTED. The SARS-CoV-2 RNA is generally detectable in upper respiratoy specimens during the acute phase of infection. The lowest concentration of SARS-CoV-2 viral copies this assay can detect is 131 copies/mL. A negative result does not preclude SARS-Cov-2 infection and should not be used as the sole basis for treatment or other patient management decisions. A negative result may occur with  improper specimen collection/handling, submission of specimen other than nasopharyngeal swab, presence of viral mutation(s) within the areas targeted by this assay, and inadequate number of viral copies (<131 copies/mL). A negative result must be combined with clinical observations, patient history, and epidemiological information. The expected result is Negative. Fact Sheet for Patients:  PinkCheek.be Fact Sheet for Healthcare Providers:  GravelBags.it This test is not yet ap proved or  cleared by the [place] FDA and  has been authorized for detection and/or diagnosis of SARS-CoV-2 by FDA under an Emergency Use Authorization (EUA). This EUA will remain  in effect (meaning this test can be used) for the duration of the COVID-19 declaration under Section 564(b)(1) of the Act, 21 U.S.C. section 360bbb-3(b)(1), unless the authorization is terminated or revoked sooner.    Influenza A by PCR NEGATIVE NEGATIVE Final   Influenza B by PCR NEGATIVE NEGATIVE Final    Comment: (NOTE) The Xpert Xpress SARS-CoV-2/FLU/RSV assay is intended as an aid in  the diagnosis of influenza from Nasopharyngeal swab specimens and  should not be used as a sole basis for treatment. Nasal washings and  aspirates are unacceptable for Xpert Xpress SARS-CoV-2/FLU/RSV  testing. Fact Sheet for Patients: PinkCheek.be Fact Sheet for Healthcare Providers: GravelBags.it This test is not yet approved or cleared by the [place] FDA and  has been authorized for detection and/or diagnosis of SARS-CoV-2 by  FDA under an Emergency Use Authorization (EUA). This EUA will remain  in effect (meaning this test can be used) for the duration of the  Covid-19 declaration under Section 564(b)(1) of the Act, 21  U.S.C. section 360bbb-3(b)(1), unless the authorization is  terminated or revoked. Performed at [facility], [place] [address], [place], [place] [address]   Blood culture (routine x 2)     Status: None (Preliminary result)   Collection Time: 04/27/20  4:13 PM   Specimen: BLOOD  Result Value Ref Range Status   Specimen Description   Final    BLOOD RIGHT ARM Performed at [facility] [address] [place], [place] [address]    Special Requests   Final    BOTTLES DRAWN AEROBIC AND ANAEROBIC Blood Culture adequate volume Performed at [facility] [address], [place], [place]  [address]    Culture   Final    NO GROWTH < 12 HOURS Performed at [facility] [address], [place],  [address]    Report Status PENDING  Incomplete         Radiology Studies: CT  Angio Chest PE W and/or Wo Contrast  Result Date: 04/27/2020 CLINICAL DATA:  Gastric bypass surgery 04/21/2020. Elevated D-dimer. Intermittent nausea and left lower quadrant pain. EXAM: CT ANGIOGRAPHY CHEST CT ABDOMEN AND PELVIS WITH CONTRAST TECHNIQUE: Multidetector CT imaging of the chest was performed using the standard protocol during bolus administration of intravenous contrast. Multiplanar CT image reconstructions and MIPs were obtained to evaluate the vascular anatomy. Multidetector CT imaging of the abdomen and pelvis was performed using the standard protocol during bolus administration of intravenous contrast. CONTRAST:  12mL OMNIPAQUE IOHEXOL 350 MG/ML SOLN COMPARISON:  09/14/2019 04/18/2019 CT abdomen/pelvis. 11/12/2014 chest CT angiogram. Chest radiograph. FINDINGS: CTA CHEST FINDINGS Cardiovascular: The study is moderate quality for the evaluation of pulmonary embolism, with some degradation by motion and habitus. There are no filling defects in the central, lobar, segmental or subsegmental pulmonary artery branches to suggest acute pulmonary embolism. Great vessels are normal in course and caliber. Top-normal heart size. No significant pericardial fluid/thickening. Mediastinum/Nodes: No discrete thyroid nodules. Unremarkable esophagus. No pathologically enlarged axillary, mediastinal or hilar lymph nodes. Lungs/Pleura: No pneumothorax. Trace dependent left pleural effusion. No right pleural effusion. No acute consolidative airspace disease, lung masses or significant pulmonary nodules. Musculoskeletal: No aggressive appearing focal osseous lesions. Mild thoracic spondylosis. Review of the MIP images confirms the above findings. CT ABDOMEN and PELVIS FINDINGS Hepatobiliary: Normal liver with no  liver mass. Normal gallbladder with no radiopaque cholelithiasis. No biliary ductal dilatation. CBD diameter 5 mm. Pancreas: Normal, with no mass or duct dilation. Spleen: Normal size. No mass. Adrenals/Urinary Tract: Normal adrenals. Normal kidneys with no hydronephrosis and no renal mass. Normal bladder. 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 Gastrojejunostomy appears grossly intact. Excluded distal stomach contains mild fluid. No definite gastric wall thickening or pneumatosis. Enteroenterostomy noted in the right abdomen. No dilated or thick-walled small bowel loops. Normal appendix. Moderate colorectal stool. No large bowel wall thickening, diverticulosis or pericolonic fat stranding. Vascular/Lymphatic: Atherosclerotic nonaneurysmal abdominal aorta. Patent portal, splenic, hepatic and renal veins. No pathologically enlarged lymph nodes in the abdomen or pelvis. Reproductive: Status post hysterectomy, with no abnormal findings at the vaginal cuff. No adnexal mass. Other: No pneumoperitoneum. No focal fluid collection. Small amount of simple free fluid in the pelvis. Mild subcutaneous fat stranding at the presumed port sites in ventral abdominal wall bilaterally. Musculoskeletal: No aggressive appearing focal osseous lesions. Review of the MIP images confirms the above findings. IMPRESSION: 1. No evidence of pulmonary embolism. Trace dependent left pleural effusion. No active pulmonary disease. 2. 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 No evidence of bowel complication. No free air. No appreciable abscess. 3. Small amount of simple free fluid in the pelvis. 4. Aortic Atherosclerosis (ICD10-I70.0). Electronically Signed   By: Ilona Sorrel M.D.   On: 04/27/2020 11:36   CT ABDOMEN PELVIS W CONTRAST  Result Date: 04/27/2020 CLINICAL DATA:  Gastric  bypass surgery 04/21/2020. Elevated D-dimer. Intermittent nausea and left lower quadrant pain. EXAM: CT ANGIOGRAPHY CHEST CT ABDOMEN AND PELVIS WITH CONTRAST TECHNIQUE: Multidetector CT imaging of the chest was performed using the standard protocol during bolus administration of intravenous contrast. Multiplanar CT image reconstructions and MIPs were obtained to evaluate the vascular anatomy. Multidetector CT imaging of the abdomen and pelvis was performed using the standard protocol during bolus administration of intravenous contrast. CONTRAST:  117mL OMNIPAQUE IOHEXOL 350 MG/ML SOLN COMPARISON:  09/14/2019 04/18/2019 CT abdomen/pelvis. 11/12/2014  chest CT angiogram. Chest radiograph. FINDINGS: CTA CHEST FINDINGS Cardiovascular: The study is moderate quality for the evaluation of pulmonary embolism, with some degradation by motion and habitus. There are no filling defects in the central, lobar, segmental or subsegmental pulmonary artery branches to suggest acute pulmonary embolism. Great vessels are normal in course and caliber. Top-normal heart size. No significant pericardial fluid/thickening. Mediastinum/Nodes: No discrete thyroid nodules. Unremarkable esophagus. No pathologically enlarged axillary, mediastinal or hilar lymph nodes. Lungs/Pleura: No pneumothorax. Trace dependent left pleural effusion. No right pleural effusion. No acute consolidative airspace disease, lung masses or significant pulmonary nodules. Musculoskeletal: No aggressive appearing focal osseous lesions. Mild thoracic spondylosis. Review of the MIP images confirms the above findings. CT ABDOMEN and PELVIS FINDINGS Hepatobiliary: Normal liver with no liver mass. Normal gallbladder with no radiopaque cholelithiasis. No biliary ductal dilatation. CBD diameter 5 mm. Pancreas: Normal, with no mass or duct dilation. Spleen: Normal size. No mass. Adrenals/Urinary Tract: Normal adrenals. Normal kidneys with no hydronephrosis and no renal mass.  Normal bladder. 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 Gastrojejunostomy appears grossly intact. Excluded distal stomach contains mild fluid. No definite gastric wall thickening or pneumatosis. Enteroenterostomy noted in the right abdomen. No dilated or thick-walled small bowel loops. Normal appendix. Moderate colorectal stool. No large bowel wall thickening, diverticulosis or pericolonic fat stranding. Vascular/Lymphatic: Atherosclerotic nonaneurysmal abdominal aorta. Patent portal, splenic, hepatic and renal veins. No pathologically enlarged lymph nodes in the abdomen or pelvis. Reproductive: Status post hysterectomy, with no abnormal findings at the vaginal cuff. No adnexal mass. Other: No pneumoperitoneum. No focal fluid collection. Small amount of simple free fluid in the pelvis. Mild subcutaneous fat stranding at the presumed port sites in ventral abdominal wall bilaterally. Musculoskeletal: No aggressive appearing focal osseous lesions. Review of the MIP images confirms the above findings. IMPRESSION: 1. No evidence of pulmonary embolism. Trace dependent left pleural effusion. No active pulmonary disease. 2. 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 No evidence of bowel complication. No free air. No appreciable abscess. 3. Small amount of simple free fluid in the pelvis. 4. Aortic Atherosclerosis (ICD10-I70.0). Electronically Signed   By: Ilona Sorrel M.D.   On: 04/27/2020 11:36        Scheduled Meds: . citalopram  20 mg Oral Daily  . enoxaparin (LOVENOX) injection  40 mg Subcutaneous Q24H  . ferrous sulfate  325 mg Oral Q breakfast  . insulin aspart  0-15 Units Subcutaneous TID WC  . insulin aspart  0-5 Units Subcutaneous QHS  . levETIRAcetam  500 mg Oral QHS  . metoCLOPramide  5 mg Oral TID AC  . mometasone-formoterol  2 puff  Inhalation BID  . nortriptyline  50 mg Oral QHS  . pantoprazole  40 mg Oral Daily  . sodium chloride flush  3 mL Intravenous Once  . spironolactone  25 mg Oral Daily   Continuous Infusions: . dextrose 5% lactated ringers Stopped (04/28/20 0939)  . lactated ringers 75 mL/hr at 04/28/20 0910     Time spent: 30  minutes with over 50% of the time coordinating the patient's care    Harold Hedge, DO [facility] Pager [phone]  Call night coverage person covering after 7pm

## 2020-04-28 NOTE — Consult Note (Signed)
[facility] Surgery Consult Note  Victoria Moore 11-08-1976  [id number].    Requesting MD: Marva Panda Chief Complaint/Reason for Consult: abdominal pain s/p Roux en Y  HPI:  Victoria Moore is a 44yo female PMH DM, HTN, asthma, and obesity s/p recent laparoscopic gastric bypass surgery (Roux-en-Y) 04/21/20 by Dr. Andrey Farmer at [facility]. She was discharged home on 04/24/20. States that she has had abdominal pain since surgery, not well controlled. Pain became acutely worse over the weekend. States that pain is mostly in her left abdomen. Constant and severe, nothing makes it better or worse. At times the pain radiates into her chest and she would feel somewhat short of breath. Currently denies any CP or SOB. Nausea became worse and she started vomiting over the weekend. Cannot keep protein shakes down. She is not passing flatus and has not had a BM since surgery. She is burping. Denies fever or chills. Urinating without issues. She reports some minor swelling in her feet but no calf pain or swelling. In the ED patient underwent CT abdomen/pelvis which showed no acute changes. CTA negative for PE. WBC 6.6, lactic acid 0.8, Cr 1.02, afebrile, all VSS.  EDP contacted [facility] and spoke with a surgical fellow who recommended to admit to [facility] for medical management. General surgery asked to see.  Anticoagulants: none Nonsmoker Denies alcohol use  Review of Systems  Constitutional: Negative.   HENT: Negative.   Eyes: Negative.   Respiratory: Positive for shortness of breath.   Cardiovascular: Positive for chest pain. Negative for leg swelling.  Gastrointestinal: Positive for abdominal pain, constipation, nausea and vomiting.  Genitourinary: Negative.   Musculoskeletal: Negative.   Skin: Negative.   Neurological: Negative.     All systems reviewed and otherwise negative except for as above  Family History  Adopted: Yes  Problem Relation Age of Onset  . Other Son         Growing pains  . Asthma Mother   . Heart Problems Mother   . Migraines Mother   . COPD Mother   . Heart attack Mother   . Diabetes Father   . Peptic Ulcer Father   . Heart Problems Father   . COPD Father   . Heart attack Father   . Colon cancer Neg Hx   . Colon polyps Neg Hx   . Esophageal cancer Neg Hx   . Rectal cancer Neg Hx   . Stomach cancer Neg Hx     Past Medical History:  Diagnosis Date  . Acute low back pain without sciatica 06/22/2018  . ADHD   . Allergy   . Anemia   . Anxiety   . Arthritis   . 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 Fibromyalgia, doubt sarcoid   . Asthma   . Blood transfusion without reported diagnosis   . Chest wall pain 05/26/2018  . Chronic kidney disease   . Chronic low back pain with sciatica 09/15/2018  . Common migraine with intractable migraine 10/07/2016  . Cough variant asthma vs UACS  10/23/2016   - Spirometry 04/15/2016  Very truncated exp loop effort dep portion only  - Allergy profile 10/22/2016 >  Eos 0.2/  IgE  52 RAST POS grass/trees/ragweed  10/22/2016  After extensive coaching HFA effectiveness =    75% try duelra 100 2bid > improved  . Daytime sleepiness 05/26/2018  . Decreased pedal pulses 10/04/2018  . Depression   . Depression,  major, single episode, moderate ([place]) 05/26/2018  . Diabetes ([place])   . Dyspnea 04/15/2016   04/15/2016  Walked RA x 3 laps @ 185 ft each stopped due to  End of study, nl pace, no sob or desat    - Spirometry 04/15/2016  Very truncated exp loop effort dep portion only  - 10/22/2016  Walked RA x 3 laps @ 185 ft each stopped due to  End of study, slow pace, min sob/ no desat - full pfts rec 10/22/2016 >>>      . Edema   . Essential hypertension, benign 11/15/2016  . Fibroids 03/11/2016  . Frequency of urination 09/11/2018  . GERD (gastroesophageal reflux disease)   . Herniated lumbar intervertebral disc   . Hiatal hernia   . Hilar adenopathy   . Hypertension    . Hypokalemia 06/28/2018  . Impaired fasting blood sugar 09/11/2018  . Iron deficiency   . Leg pain 12/07/2018  . Low libido 11/27/2018  . Migraine   . Mild sleep apnea 08/03/2018   HST 06/20/18  AHI  8.1 / snoring with 02 nadir 80% >  08/03/2018 rec sleep medicine consultation   . Morbid (severe) obesity due to excess calories ([place]) 10/23/2016  . Personal history of sarcoidosis 05/26/2018  . Prolonged capillary refill time   . Right leg swelling 08/18/2018  . Sarcoidosis    personal history of  . Seizures ([place])    history of   . Sleep apnea   . Spondylosis   . Vitamin D deficiency     Past Surgical History:  Procedure Laterality Date  . ABDOMINAL HYSTERECTOMY    . ESOPHAGEAL MANOMETRY N/A 09/05/2019   Procedure: ESOPHAGEAL MANOMETRY (EM);  Surgeon: Lavena Bullion, DO;  Location: [facility];  Service: Gastroenterology;  Laterality: N/A;  . LAPAROSCOPIC OVARIAN CYSTECTOMY Left 03/11/2016   Procedure: LAPAROSCOPIC OVARIAN CYSTECTOMY;  Surgeon: Eldred Manges, MD;  Location: [facility];  Service: Gynecology;  Laterality: Left;  . LAPAROSCOPIC VAGINAL HYSTERECTOMY WITH SALPINGECTOMY Bilateral 03/11/2016   Procedure: LAPAROSCOPIC ASSISTED VAGINAL HYSTERECTOMY WITH SALPINGECTOMY;  Surgeon: Eldred Manges, MD;  Location: [facility];  Service: Gynecology;  Laterality: Bilateral;  . [place] IMPEDANCE STUDY  09/05/2019   Procedure: [place] IMPEDANCE STUDY;  Surgeon: Lavena Bullion, DO;  Location: [facility];  Service: Gastroenterology;;  . TUBAL LIGATION    . UPPER GASTROINTESTINAL ENDOSCOPY  10/11/2019   06/2019    Social History:  reports that she quit smoking about 7 years ago. Her smoking use included cigarettes. She has a 4.25 pack-year smoking history. She has never used smokeless tobacco. She reports previous drug use. Drug: Marijuana. She reports that she does not drink alcohol.  Allergies:  Allergies  Allergen Reactions  . Aspirin Anaphylaxis and Hives         Facility-Administered  Medications Prior to Admission  Medication Dose Route Frequency Provider Last Rate Last Admin  . nitroGLYCERIN (NITROSTAT) SL tablet 0.4 mg  0.4 mg Sublingual Q5 min PRN Ngetich, Dinah C, NP   0.4 mg at 09/14/19 1621   Medications Prior to Admission  Medication Sig Dispense Refill  . ACETAMINOPHEN EXTRA STRENGTH 500 MG tablet Take 1,000 mg by mouth every 6 (six) hours as needed for pain.    Marland Kitchen acetaZOLAMIDE (DIAMOX) 500 MG capsule Take 2 capsules (1,000 mg total) by mouth 2 (two) times daily. 120 capsule 3  . albuterol (PROVENTIL) (2.5 MG/3ML) 0.083% nebulizer solution Take 2.5 mg by nebulization every 6 (six) hours as needed  for wheezing or shortness of breath.    Marland Kitchen albuterol (VENTOLIN HFA) 108 (90 Base) MCG/ACT inhaler Inhale 1-2 puffs into the lungs every 4 (four) hours as needed for wheezing or shortness of breath. 8 g 3  . ALPRAZolam (XANAX) 1 MG tablet Take 0.5-1 tablets (0.5-1 mg total) by mouth at bedtime as needed for anxiety. 30 tablet 0  . amphetamine-dextroamphetamine (ADDERALL XR) 20 MG 24 hr capsule Take 20 mg by mouth in the morning.    . chlorthalidone (HYGROTON) 25 MG tablet TAKE 1 TABLET BY MOUTH EVERY DAY (Patient taking differently: Take 25 mg by mouth daily. ) 90 tablet 1  . citalopram (CELEXA) 20 MG tablet TAKE 1 TABLET BY MOUTH EVERY DAY (Patient taking differently: Take 20 mg by mouth daily. ) 90 tablet 0  . ferrous sulfate 325 (65 FE) MG tablet Take 325 mg by mouth daily with breakfast.    . Fluticasone-Salmeterol (ADVAIR DISKUS) 100-50 MCG/DOSE AEPB Inhale 1 puff into the lungs 2 (two) times daily. 60 each 5  . gabapentin (NEURONTIN) 300 MG capsule Take 300 mg by mouth 3 (three) times daily.     . hyoscyamine (LEVSIN SL) 0.125 MG SL tablet Place 1 tablet (0.125 mg total) under the tongue every 6 (six) hours as needed. (Patient taking differently: Place 0.125 mg under the tongue every 6 (six) hours as needed for cramping. ) 30 tablet 1  . metFORMIN (GLUCOPHAGE) 500 MG tablet  Take 500 mg by mouth daily.     . methocarbamol (ROBAXIN) 750 MG tablet Take 750 mg by mouth 3 (three) times daily.    . mometasone-formoterol (DULERA) 100-5 MCG/ACT AERO Inhale 2 puffs into the lungs 2 (two) times daily. 13 g 3  . nortriptyline (PAMELOR) 25 MG capsule Take 2 capsules at bedtime. (Patient taking differently: Take 50 mg by mouth at bedtime. ) 180 capsule 1  . ondansetron (ZOFRAN-ODT) 4 MG disintegrating tablet Take 4 mg by mouth every 8 (eight) hours as needed for nausea/vomiting.    Marland Kitchen OVER THE COUNTER MEDICATION Take 1 tablet by mouth daily. Bariatric vitamins    . oxyCODONE (OXY IR/ROXICODONE) 5 MG immediate release tablet Take 5 mg by mouth 2 (two) times daily as needed for pain.    . pantoprazole (PROTONIX) 40 MG tablet Take 40 mg by mouth daily.    . polyethylene glycol powder (GLYCOLAX/MIRALAX) 17 GM/SCOOP powder Take 17 g by mouth daily. Take 1 packet (17g) by mouth once daily for 30 days. Mix in 4-8oz of fluid prior to taking.    Marland Kitchen scopolamine (TRANSDERM-SCOP) 1 MG/3DAYS Place 1 patch onto the skin every 3 (three) days.    Marland Kitchen spironolactone (ALDACTONE) 25 MG tablet TAKE 1 TABLET BY MOUTH EVERY DAY (Patient taking differently: Take 25 mg by mouth daily. ) 90 tablet 1  . SUMAtriptan (IMITREX) 100 MG tablet TAKE 1 TABLET BY MOUTH 2X DAILY AS NEEDED FOR MIGRAINE. MAY REPEAT IN 2 HOURS IF HEADACHE PERSISTS OR RECURS. (Patient taking differently: Take 100 mg by mouth every 2 (two) hours as needed for migraine. May repeat in 2 hours if headache persists or reoccurs) 9 tablet 3  . testosterone cypionate (DEPOTESTOSTERONE CYPIONATE) 200 MG/ML injection Inject 200 mg into the muscle every 30 (thirty) days.    . traMADol (ULTRAM) 50 MG tablet Take 50 mg by mouth every 6 (six) hours as needed.    Marland Kitchen amphetamine-dextroamphetamine (ADDERALL XR) 30 MG 24 hr capsule Take 1 capsule (30 mg total) by mouth daily as  needed (ADHD). (Patient not taking: Reported on 04/27/2020) 30 capsule 0  . famotidine  (PEPCID) 40 MG tablet Take 1 tablet (40 mg total) by mouth 2 (two) times daily. (Patient not taking: Reported on 04/27/2020) 180 tablet 5  . levETIRAcetam (KEPPRA) 500 MG tablet 1/2 tablet twice daily for 1 week and then take 1 full tablet twice daily (Patient not taking: Reported on 04/27/2020) 60 tablet 5  . meloxicam (MOBIC) 7.5 MG tablet Take 1 tablet (7.5 mg total) by mouth daily. (Patient not taking: Reported on 02/04/2020) 10 tablet 0  . sucralfate (CARAFATE) 1 g tablet Take 1 tablet (1 g total) by mouth 2 (two) times daily. (Patient not taking: Reported on 04/27/2020) 90 tablet 3    Prior to Admission medications   Medication Sig Start Date End Date Taking? Authorizing Provider  ACETAMINOPHEN EXTRA STRENGTH 500 MG tablet Take 1,000 mg by mouth every 6 (six) hours as needed for pain. 04/08/20  Yes [provider]  acetaZOLAMIDE (DIAMOX) 500 MG capsule Take 2 capsules (1,000 mg total) by mouth 2 (two) times daily. 01/28/20  Yes Kathrynn Ducking, MD  albuterol (PROVENTIL) (2.5 MG/3ML) 0.083% nebulizer solution Take 2.5 mg by nebulization every 6 (six) hours as needed for wheezing or shortness of breath.   Yes [provider]  albuterol (VENTOLIN HFA) 108 (90 Base) MCG/ACT inhaler Inhale 1-2 puffs into the lungs every 4 (four) hours as needed for wheezing or shortness of breath. 01/14/20  Yes Parrett, Fonnie Mu, NP  ALPRAZolam (XANAX) 1 MG tablet Take 0.5-1 tablets (0.5-1 mg total) by mouth at bedtime as needed for anxiety. 12/05/19  Yes Ngetich, Dinah C, NP  amphetamine-dextroamphetamine (ADDERALL XR) 20 MG 24 hr capsule Take 20 mg by mouth in the morning. 04/08/20  Yes [provider]  chlorthalidone (HYGROTON) 25 MG tablet TAKE 1 TABLET BY MOUTH EVERY DAY Patient taking differently: Take 25 mg by mouth daily.  11/05/19  Yes Ngetich, Dinah C, NP  citalopram (CELEXA) 20 MG tablet TAKE 1 TABLET BY MOUTH EVERY DAY Patient taking differently: Take 20 mg by mouth daily.  01/14/20  Yes  Ngetich, Dinah C, NP  ferrous sulfate 325 (65 FE) MG tablet Take 325 mg by mouth daily with breakfast.   Yes [provider]  Fluticasone-Salmeterol (ADVAIR DISKUS) 100-50 MCG/DOSE AEPB Inhale 1 puff into the lungs 2 (two) times daily. 11/20/19  Yes Parrett, Tammy S, NP  gabapentin (NEURONTIN) 300 MG capsule Take 300 mg by mouth 3 (three) times daily.  04/08/20  Yes [provider]  hyoscyamine (LEVSIN SL) 0.125 MG SL tablet Place 1 tablet (0.125 mg total) under the tongue every 6 (six) hours as needed. Patient taking differently: Place 0.125 mg under the tongue every 6 (six) hours as needed for cramping.  11/05/19  Yes Cirigliano, Vito V, DO  metFORMIN (GLUCOPHAGE) 500 MG tablet Take 500 mg by mouth daily.    Yes [provider]  methocarbamol (ROBAXIN) 750 MG tablet Take 750 mg by mouth 3 (three) times daily. 04/24/20  Yes [provider]  mometasone-formoterol (DULERA) 100-5 MCG/ACT AERO Inhale 2 puffs into the lungs 2 (two) times daily. 08/10/19  Yes Ngetich, Dinah C, NP  nortriptyline (PAMELOR) 25 MG capsule Take 2 capsules at bedtime. Patient taking differently: Take 50 mg by mouth at bedtime.  12/17/19  Yes Suzzanne Cloud, NP  ondansetron (ZOFRAN-ODT) 4 MG disintegrating tablet Take 4 mg by mouth every 8 (eight) hours as needed for nausea/vomiting. 04/08/20  Yes  [provider]  OVER THE COUNTER MEDICATION Take 1 tablet by mouth daily. Bariatric vitamins   Yes [provider]  oxyCODONE (OXY IR/ROXICODONE) 5 MG immediate release tablet Take 5 mg by mouth 2 (two) times daily as needed for pain. 04/24/20  Yes [provider]  pantoprazole (PROTONIX) 40 MG tablet Take 40 mg by mouth daily. 04/08/20  Yes [provider]  polyethylene glycol powder (GLYCOLAX/MIRALAX) 17 GM/SCOOP powder Take 17 g by mouth daily. Take 1 packet (17g) by mouth once daily for 30 days. Mix in 4-8oz of fluid prior to taking. 04/08/20 05/08/20 Yes [provider]  scopolamine (TRANSDERM-SCOP) 1 MG/3DAYS Place 1 patch onto the skin every 3 (three) days. 04/08/20  Yes [provider]  spironolactone (ALDACTONE) 25 MG tablet TAKE 1 TABLET BY MOUTH EVERY DAY Patient taking differently: Take 25 mg by mouth daily.  11/05/19  Yes Ngetich, Dinah C, NP  SUMAtriptan (IMITREX) 100 MG tablet TAKE 1 TABLET BY MOUTH 2X DAILY AS NEEDED FOR MIGRAINE. MAY REPEAT IN 2 HOURS IF HEADACHE PERSISTS OR RECURS. Patient taking differently: Take 100 mg by mouth every 2 (two) hours as needed for migraine. May repeat in 2 hours if headache persists or reoccurs 12/11/19  Yes Kathrynn Ducking, MD  testosterone cypionate (DEPOTESTOSTERONE CYPIONATE) 200 MG/ML injection Inject 200 mg into the muscle every 30 (thirty) days. 03/28/20  Yes [provider]  traMADol (ULTRAM) 50 MG tablet Take 50 mg by mouth every 6 (six) hours as needed. 03/27/20  Yes [provider]  amphetamine-dextroamphetamine (ADDERALL XR) 30 MG 24 hr capsule Take 1 capsule (30 mg total) by mouth daily as needed (ADHD). Patient not taking: Reported on 04/27/2020 11/02/19   Ngetich, Dinah C, NP  famotidine (PEPCID) 40 MG tablet Take 1 tablet (40 mg total) by mouth 2 (two) times daily. Patient not taking: Reported on 04/27/2020 09/20/19   Cirigliano, Dominic Pea, DO  levETIRAcetam (KEPPRA) 500 MG tablet 1/2 tablet twice daily for 1 week and then take 1 full tablet twice daily Patient not taking: Reported on 04/27/2020 10/15/19   Kathrynn Ducking, MD  meloxicam (MOBIC) 7.5 MG tablet Take 1 tablet (7.5 mg total) by mouth daily. Patient not taking: Reported on 02/04/2020 10/09/19   Nahser, Wonda Cheng, MD  sucralfate (CARAFATE) 1 g tablet Take 1 tablet (1 g total) by mouth 2 (two) times daily. Patient not taking: Reported on 04/27/2020 11/14/19   Cirigliano, Vito V, DO    Blood pressure 120/60, pulse 77, temperature 98.6 F (37 C), temperature source Oral, resp. rate 17, height 5\' 9"  (1.753 m), weight (!)  143.8 kg, last menstrual period 02/19/2016, SpO2 98 %. Physical Exam: General: pleasant black female who is laying in bed in NAD HEENT: head is normocephalic, atraumatic.  Sclera are noninjected.  PERRL.  Ears and nose without any masses or lesions.  Mouth is pink and moist. Dentition fair Heart: regular, rate, and rhythm.  Normal s1,s2. No obvious murmurs, gallops, or rubs noted.  Palpable pedal pulses bilaterally  Lungs: CTAB, no wheezes, rhonchi, or rales noted.  Respiratory effort nonlabored Abd: obese, soft, well healed laparoscopic incisions, TTP left upper and lower quadrants with some voluntary guarding, no peritonitis, +BS MS: calves soft and nontender without edema Skin: warm and dry with no masses, lesions, or rashes Psych: A&Ox4 with an appropriate affect Neuro: cranial nerves grossly intact, equal strength in BUE/BLE bilaterally, normal speech, thought process intact  Results for orders placed or performed during  the hospital encounter of 04/27/20 (from the past 48 hour(s))  Lipase, blood     Status: None   Collection Time: 04/27/20  8:29 AM  Result Value Ref Range   Lipase 21 11 - 51 U/L    Comment: Performed at [facility], [place] [address], [place], [place] [address]  Comprehensive metabolic panel     Status: Abnormal   Collection Time: 04/27/20  8:29 AM  Result Value Ref Range   Sodium 137 135 - 145 mmol/L   Potassium 3.4 (L) 3.5 - 5.1 mmol/L   Chloride 101 98 - 111 mmol/L   CO2 28 22 - 32 mmol/L   Glucose, Bld 86 70 - 99 mg/dL    Comment: Glucose reference range applies only to samples taken after fasting for at least 8 hours.   BUN 11 6 - 20 mg/dL   Creatinine, Ser 1.02 (H) 0.44 - 1.00 mg/dL   Calcium 8.6 (L) 8.9 - 10.3 mg/dL   Total Protein 7.3 6.5 - 8.1 g/dL   Albumin 3.8 3.5 - 5.0 g/dL   AST 19 15 - 41 U/L   ALT 17 0 - 44 U/L   Alkaline Phosphatase 57 38 - 126 U/L   Total Bilirubin 0.6 0.3 - 1.2 mg/dL   GFR calc non Af Amer >60 >60 mL/min    GFR calc Af Amer >60 >60 mL/min   Anion gap 8 5 - 15    Comment: Performed at [facility], [place] [address] [place], [place] [address]  CBC     Status: Abnormal   Collection Time: 04/27/20  8:29 AM  Result Value Ref Range   WBC 6.6 4.0 - 10.5 K/uL   RBC 4.21 3.87 - 5.11 MIL/uL   Hemoglobin 11.6 (L) 12.0 - 15.0 g/dL   HCT 37.0 36.0 - 46.0 %   MCV 87.9 80.0 - 100.0 fL   MCH 27.6 26.0 - 34.0 pg   MCHC 31.4 30.0 - 36.0 g/dL   RDW 13.4 11.5 - 15.5 %   Platelets 250 150 - 400 K/uL   nRBC 0.0 0.0 - 0.2 %    Comment: Performed at [facility], [place] [address] [place], [place] [address]  Urinalysis, Routine w reflex microscopic     Status: Abnormal   Collection Time: 04/27/20  8:29 AM  Result Value Ref Range   Color, Urine YELLOW YELLOW   APPearance CLEAR CLEAR   Specific Gravity, Urine 1.010 1.005 - 1.030   pH 5.0 5.0 - 8.0   Glucose, UA NEGATIVE NEGATIVE mg/dL   Hgb urine dipstick NEGATIVE NEGATIVE   Bilirubin Urine NEGATIVE NEGATIVE   Ketones, ur 20 (A) NEGATIVE mg/dL   Protein, ur NEGATIVE NEGATIVE mg/dL   Nitrite NEGATIVE NEGATIVE   Leukocytes,Ua MODERATE (A) NEGATIVE   RBC / HPF 6-10 0 - 5 RBC/hpf   WBC, UA 0-5 0 - 5 WBC/hpf   Bacteria, UA NONE SEEN NONE SEEN   Squamous Epithelial / LPF 0-5 0 - 5    Comment: Performed at [facility], [place] [address] [place], [place] [address]  Troponin I (High Sensitivity)     Status: None   Collection Time: 04/27/20  8:29 AM  Result Value Ref Range   Troponin I (High Sensitivity) <2 <18 ng/L    Comment: (NOTE) Elevated high sensitivity troponin I (hsTnI) values and significant  changes across serial measurements may suggest ACS but many other  chronic and acute conditions are known  to elevate hsTnI results.  Refer to the "Links" section for chest pain algorithms and additional  guidance. Performed at [facility], [place] [address] [place], [place] [address]    D-dimer, quantitative (not at [facility])     Status: Abnormal   Collection Time: 04/27/20  8:45 AM  Result Value Ref Range   D-Dimer, Quant 4.27 (H) 0.00 - 0.50 ug/mL-FEU    Comment: (NOTE) At the manufacturer cut-off of 0.50 ug/mL FEU, this assay has been documented to exclude PE with a sensitivity and negative predictive value of 97 to 99%.  At this time, this assay has not been approved by the FDA to exclude DVT/VTE. Results should be correlated with clinical presentation. Performed at [facility], [place] [address] [place], [place] [address]   Lactic acid, plasma     Status: None   Collection Time: 04/27/20  9:14 AM  Result Value Ref Range   Lactic Acid, Venous 0.8 0.5 - 1.9 mmol/L    Comment: Performed at [facility], [place] [address], [place], [place] [address]  Blood culture (routine x 2)     Status: None (Preliminary result)   Collection Time: 04/27/20  9:14 AM   Specimen: BLOOD LEFT FOREARM  Result Value Ref Range   Specimen Description      BLOOD LEFT FOREARM Performed at [place] [address], [place], [place] [address]    Special Requests      BOTTLES DRAWN AEROBIC AND ANAEROBIC Blood Culture adequate volume Performed at [place] [address], [place], [place] [address]    Culture      NO GROWTH < 24 HOURS Performed at [place] [address] [place], [place] [address]    Report Status PENDING   Troponin I (High Sensitivity)     Status: None   Collection Time: 04/27/20 11:41 AM  Result Value Ref Range   Troponin I (High Sensitivity) <2 <18 ng/L    Comment: (NOTE) Elevated high sensitivity troponin I (hsTnI) values and significant  changes across serial measurements may suggest ACS but many other  chronic and acute conditions are known to elevate hsTnI results.  Refer to the "Links" section for chest pain algorithms and additional  guidance. Performed at [facility], [place] [address], [place], [place] [address]   Respiratory Panel by RT PCR (Flu A&B, Covid) - Nasopharyngeal Swab     Status: None   Collection Time: 04/27/20 12:22 PM   Specimen: Nasopharyngeal Swab  Result Value Ref Range   SARS Coronavirus 2 by RT PCR NEGATIVE NEGATIVE    Comment: (NOTE) SARS-CoV-2 target nucleic acids are NOT DETECTED. The SARS-CoV-2 RNA is generally detectable in upper respiratoy specimens during the acute phase of infection. The lowest concentration of SARS-CoV-2 viral copies this assay can detect is 131 copies/mL. A negative result does not preclude SARS-Cov-2 infection and should not be used as the sole basis for treatment or other patient management decisions. A negative result may occur with  improper specimen collection/handling, submission of specimen other than nasopharyngeal swab, presence of viral mutation(s) within the areas targeted by this assay, and inadequate number of viral copies (<131 copies/mL). A negative result must be combined with clinical observations, patient history, and epidemiological information. The expected result is Negative. Fact Sheet for Patients:  PinkCheek.be Fact Sheet for Healthcare Providers:  GravelBags.it This test is not yet ap proved or cleared by the [place]  FDA and  has been authorized for detection and/or diagnosis of SARS-CoV-2 by FDA under an Emergency Use Authorization (EUA). This EUA will remain  in effect (meaning this test can be used) for the duration of the COVID-19 declaration under Section 564(b)(1) of the Act, 21 U.S.C. section 360bbb-3(b)(1), unless the authorization is terminated or revoked sooner.    Influenza A by PCR NEGATIVE NEGATIVE   Influenza B by PCR NEGATIVE NEGATIVE    Comment: (NOTE) The Xpert Xpress SARS-CoV-2/FLU/RSV assay is intended as an aid in  the diagnosis of influenza from Nasopharyngeal swab  specimens and  should not be used as a sole basis for treatment. Nasal washings and  aspirates are unacceptable for Xpert Xpress SARS-CoV-2/FLU/RSV  testing. Fact Sheet for Patients: PinkCheek.be Fact Sheet for Healthcare Providers: GravelBags.it This test is not yet approved or cleared by the [place] FDA and  has been authorized for detection and/or diagnosis of SARS-CoV-2 by  FDA under an Emergency Use Authorization (EUA). This EUA will remain  in effect (meaning this test can be used) for the duration of the  Covid-19 declaration under Section 564(b)(1) of the Act, 21  U.S.C. section 360bbb-3(b)(1), unless the authorization is  terminated or revoked. Performed at [facility], [place] [address], [place], [place] [address]   Blood culture (routine x 2)     Status: None (Preliminary result)   Collection Time: 04/27/20  4:13 PM   Specimen: BLOOD  Result Value Ref Range   Specimen Description      BLOOD RIGHT ARM Performed at [facility] [address], [place], [place] [address]    Special Requests      BOTTLES DRAWN AEROBIC AND ANAEROBIC Blood Culture adequate volume Performed at [facility] [address], [place], [place] [address]    Culture      NO GROWTH < 12 HOURS Performed at [facility] [address] [place], [place] [address]    Report Status PENDING   HIV Antibody (routine testing w rflx)     Status: None   Collection Time: 04/27/20  4:13 PM  Result Value Ref Range   HIV Screen 4th Generation wRfx Non Reactive Non Reactive    Comment: Performed at [facility], [place] [address], [place], [place] [id number]  Basic metabolic panel     Status: Abnormal   Collection Time: 04/28/20  3:17 AM  Result Value Ref Range   Sodium 137 135 - 145 mmol/L   Potassium 3.6 3.5 - 5.1 mmol/L   Chloride 103 98 - 111 mmol/L   CO2 26 22 - 32 mmol/L    Glucose, Bld 115 (H) 70 - 99 mg/dL    Comment: Glucose reference range applies only to samples taken after fasting for at least 8 hours.   BUN 8 6 - 20 mg/dL   Creatinine, Ser 0.84 0.44 - 1.00 mg/dL   Calcium 8.3 (L) 8.9 - 10.3 mg/dL   GFR calc non Af Amer >60 >60 mL/min   GFR calc Af Amer >60 >60 mL/min   Anion gap 8 5 - 15    Comment: Performed at [facility], [place] [address], [place], [place] [address]  CBC     Status: Abnormal   Collection Time: 04/28/20  3:17 AM  Result Value Ref Range   WBC 5.9 4.0 - 10.5 K/uL   RBC 3.67 (L) 3.87 - 5.11 MIL/uL   Hemoglobin 10.1 (L) 12.0 -  15.0 g/dL   HCT 32.1 (L) 36.0 - 46.0 %   MCV 87.5 80.0 - 100.0 fL   MCH 27.5 26.0 - 34.0 pg   MCHC 31.5 30.0 - 36.0 g/dL   RDW 13.3 11.5 - 15.5 %   Platelets 231 150 - 400 K/uL   nRBC 0.0 0.0 - 0.2 %    Comment: Performed at [facility], [place] [address], [place],  [address]   CT Angio Chest PE W and/or Wo Contrast  Result Date: 04/27/2020 CLINICAL DATA:  Gastric bypass surgery 04/21/2020. Elevated D-dimer. Intermittent nausea and left lower quadrant pain. EXAM: CT ANGIOGRAPHY CHEST CT ABDOMEN AND PELVIS WITH CONTRAST TECHNIQUE: Multidetector CT imaging of the chest was performed using the standard protocol during bolus administration of intravenous contrast. Multiplanar CT image reconstructions and MIPs were obtained to evaluate the vascular anatomy. Multidetector CT imaging of the abdomen and pelvis was performed using the standard protocol during bolus administration of intravenous contrast. CONTRAST:  152mL OMNIPAQUE IOHEXOL 350 MG/ML SOLN COMPARISON:  09/14/2019 04/18/2019 CT abdomen/pelvis. 11/12/2014 chest CT angiogram. Chest radiograph. FINDINGS: CTA CHEST FINDINGS Cardiovascular: The study is moderate quality for the evaluation of pulmonary embolism, with some degradation by motion and habitus. There are no filling defects in the central, lobar, segmental or  subsegmental pulmonary artery branches to suggest acute pulmonary embolism. Great vessels are normal in course and caliber. Top-normal heart size. No significant pericardial fluid/thickening. Mediastinum/Nodes: No discrete thyroid nodules. Unremarkable esophagus. No pathologically enlarged axillary, mediastinal or hilar lymph nodes. Lungs/Pleura: No pneumothorax. Trace dependent left pleural effusion. No right pleural effusion. No acute consolidative airspace disease, lung masses or significant pulmonary nodules. Musculoskeletal: No aggressive appearing focal osseous lesions. Mild thoracic spondylosis. Review of the MIP images confirms the above findings. CT ABDOMEN and PELVIS FINDINGS Hepatobiliary: Normal liver with no liver mass. Normal gallbladder with no radiopaque cholelithiasis. No biliary ductal dilatation. CBD diameter 5 mm. Pancreas: Normal, with no mass or duct dilation. Spleen: Normal size. No mass. Adrenals/Urinary Tract: Normal adrenals. Normal kidneys with no hydronephrosis and no renal mass. Normal bladder. 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 Gastrojejunostomy appears grossly intact. Excluded distal stomach contains mild fluid. No definite gastric wall thickening or pneumatosis. Enteroenterostomy noted in the right abdomen. No dilated or thick-walled small bowel loops. Normal appendix. Moderate colorectal stool. No large bowel wall thickening, diverticulosis or pericolonic fat stranding. Vascular/Lymphatic: Atherosclerotic nonaneurysmal abdominal aorta. Patent portal, splenic, hepatic and renal veins. No pathologically enlarged lymph nodes in the abdomen or pelvis. Reproductive: Status post hysterectomy, with no abnormal findings at the vaginal cuff. No adnexal mass. Other: No pneumoperitoneum. No focal fluid collection. Small amount of simple free fluid in the pelvis. Mild subcutaneous fat stranding at the  presumed port sites in ventral abdominal wall bilaterally. Musculoskeletal: No aggressive appearing focal osseous lesions. Review of the MIP images confirms the above findings. IMPRESSION: 1. No evidence of pulmonary embolism. Trace dependent left pleural effusion. No active pulmonary disease. 2. 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 No evidence of bowel complication. No free air. No appreciable abscess. 3. Small amount of simple free fluid in the pelvis. 4. Aortic Atherosclerosis (ICD10-I70.0). Electronically Signed   By: Ilona Sorrel M.D.   On: 04/27/2020 11:36   CT ABDOMEN PELVIS W CONTRAST  Result Date: 04/27/2020 CLINICAL DATA:  Gastric bypass surgery 04/21/2020. Elevated D-dimer. Intermittent nausea and left lower  quadrant pain. EXAM: CT ANGIOGRAPHY CHEST CT ABDOMEN AND PELVIS WITH CONTRAST TECHNIQUE: Multidetector CT imaging of the chest was performed using the standard protocol during bolus administration of intravenous contrast. Multiplanar CT image reconstructions and MIPs were obtained to evaluate the vascular anatomy. Multidetector CT imaging of the abdomen and pelvis was performed using the standard protocol during bolus administration of intravenous contrast. CONTRAST:  129mL OMNIPAQUE IOHEXOL 350 MG/ML SOLN COMPARISON:  09/14/2019 04/18/2019 CT abdomen/pelvis. 11/12/2014 chest CT angiogram. Chest radiograph. FINDINGS: CTA CHEST FINDINGS Cardiovascular: The study is moderate quality for the evaluation of pulmonary embolism, with some degradation by motion and habitus. There are no filling defects in the central, lobar, segmental or subsegmental pulmonary artery branches to suggest acute pulmonary embolism. Great vessels are normal in course and caliber. Top-normal heart size. No significant pericardial fluid/thickening. Mediastinum/Nodes: No discrete thyroid nodules. Unremarkable esophagus. No pathologically enlarged  axillary, mediastinal or hilar lymph nodes. Lungs/Pleura: No pneumothorax. Trace dependent left pleural effusion. No right pleural effusion. No acute consolidative airspace disease, lung masses or significant pulmonary nodules. Musculoskeletal: No aggressive appearing focal osseous lesions. Mild thoracic spondylosis. Review of the MIP images confirms the above findings. CT ABDOMEN and PELVIS FINDINGS Hepatobiliary: Normal liver with no liver mass. Normal gallbladder with no radiopaque cholelithiasis. No biliary ductal dilatation. CBD diameter 5 mm. Pancreas: Normal, with no mass or duct dilation. Spleen: Normal size. No mass. Adrenals/Urinary Tract: Normal adrenals. Normal kidneys with no hydronephrosis and no renal mass. Normal bladder. 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 Gastrojejunostomy appears grossly intact. Excluded distal stomach contains mild fluid. No definite gastric wall thickening or pneumatosis. Enteroenterostomy noted in the right abdomen. No dilated or thick-walled small bowel loops. Normal appendix. Moderate colorectal stool. No large bowel wall thickening, diverticulosis or pericolonic fat stranding. Vascular/Lymphatic: Atherosclerotic nonaneurysmal abdominal aorta. Patent portal, splenic, hepatic and renal veins. No pathologically enlarged lymph nodes in the abdomen or pelvis. Reproductive: Status post hysterectomy, with no abnormal findings at the vaginal cuff. No adnexal mass. Other: No pneumoperitoneum. No focal fluid collection. Small amount of simple free fluid in the pelvis. Mild subcutaneous fat stranding at the presumed port sites in ventral abdominal wall bilaterally. Musculoskeletal: No aggressive appearing focal osseous lesions. Review of the MIP images confirms the above findings. IMPRESSION: 1. No evidence of pulmonary embolism. Trace dependent left pleural effusion. No active pulmonary  disease. 2. 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 No evidence of bowel complication. No free air. No appreciable abscess. 3. Small amount of simple free fluid in the pelvis. 4. Aortic Atherosclerosis (ICD10-I70.0). Electronically Signed   By: Ilona Sorrel M.D.   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 Andrey Farmer at [facility] - Patient is hemodynamically stable, CT without any acute postsurgical complications noted. She is focally tender on abdominal exam on the left, no peritonitis. Will order UGI for further evaluation and to rule out a leak. Keep NPO for now and continue IVF.   ID - none VTE - SCDs, lovenox FEN - IVF, NPO Foley - none Follow up - Zollie Scale, [facility] Surgery 04/28/2020,  10:22 AM Please see Amion for pager number during day hours 7:00am-4:30pm

## 2020-04-28 NOTE — Progress Notes (Signed)
Asked by bedside RN to see patient.  Gastric bypass last week 4/26 at [facility], readmitted for pain here at [facility].  Plan per surgery note for UGI today.  Patient understands the exam and npo for exam.  Explained her pain as previously documented.  No concerns or questions at this time. Provided patient contact information should I be able to assist.

## 2020-04-29 LAB — BASIC METABOLIC PANEL
Anion gap: 9 (ref 5–15)
BUN: 6 mg/dL (ref 6–20)
CO2: 25 mmol/L (ref 22–32)
Calcium: 8.4 mg/dL — ABNORMAL LOW (ref 8.9–10.3)
Chloride: 103 mmol/L (ref 98–111)
Creatinine, Ser: 0.85 mg/dL (ref 0.44–1.00)
GFR calc Af Amer: >60 mL/min (ref >60)
GFR calc non Af Amer: >60 mL/min (ref >60)
Glucose, Bld: 99 mg/dL (ref 70–99)
Potassium: 3.6 mmol/L (ref 3.5–5.1)
Sodium: 137 mmol/L (ref 135–145)

## 2020-04-29 LAB — CBC
HCT: 34 % — ABNORMAL LOW (ref 36.0–46.0)
Hemoglobin: 10.8 g/dL — ABNORMAL LOW (ref 12.0–15.0)
MCH: 28.2 pg (ref 26.0–34.0)
MCHC: 31.8 g/dL (ref 30.0–36.0)
MCV: 88.8 fL (ref 80.0–100.0)
Platelets: 219 10*3/uL (ref 150–400)
RBC: 3.83 MIL/uL — ABNORMAL LOW (ref 3.87–5.11)
RDW: 13.5 % (ref 11.5–15.5)
WBC: 5.1 10*3/uL (ref 4.0–10.5)
nRBC: 0 % (ref 0.0–0.2)

## 2020-04-29 LAB — GLUCOSE, CAPILLARY
Glucose-Capillary: 86 mg/dL (ref 70–99)
Glucose-Capillary: 72 mg/dL (ref 70–99)
Glucose-Capillary: 106 mg/dL — ABNORMAL HIGH (ref 70–99)
Glucose-Capillary: 85 mg/dL (ref 70–99)

## 2020-04-29 MED ORDER — GABAPENTIN 100 MG PO CAPS
200.0000 mg | ORAL_CAPSULE | Freq: Two times a day (BID) | ORAL | Status: DC
  Administered 2020-04-29 – 2020-04-30 (×3): 200 mg via ORAL
  Filled 2020-04-29 (×3): qty 2

## 2020-04-29 MED ORDER — MAGNESIUM HYDROXIDE 400 MG/5ML PO SUSP
15.0000 mL | Freq: Once | ORAL | Status: AC
  Administered 2020-04-29: 15 mL via ORAL
  Filled 2020-04-29: qty 30

## 2020-04-29 MED ORDER — OXYCODONE HCL 5 MG PO TABS
5.0000 mg | ORAL_TABLET | ORAL | Status: DC | PRN
  Administered 2020-04-29: 5 mg via ORAL
  Filled 2020-04-29: qty 1

## 2020-04-29 MED ORDER — MORPHINE SULFATE (PF) 2 MG/ML IV SOLN
2.0000 mg | INTRAVENOUS | Status: DC | PRN
  Administered 2020-04-29: 2 mg via INTRAVENOUS
  Filled 2020-04-29: qty 1

## 2020-04-29 MED ORDER — ACETAMINOPHEN 500 MG PO TABS
1000.0000 mg | ORAL_TABLET | Freq: Three times a day (TID) | ORAL | Status: DC
  Administered 2020-04-29: 1000 mg via ORAL
  Filled 2020-04-29 (×4): qty 2

## 2020-04-29 NOTE — Progress Notes (Signed)
PROGRESS NOTE    Victoria Moore    Code Status: Full Code  [id number] DOB: 07/09/1976 DOA: 04/27/2020 LOS: 2 days  PCP: [name], MD CC:  Chief Complaint  Patient presents with  . Abdominal Pain       Hospital Summary   This is a 44 year old female with a history of anemia, OSA, hypertension, type 2 diabetes, asthma, obesity s/p recent Roux-en-Y bypass surgery (04/21/20 with Dr. Andrey Farmer at [facility]) who was discharged home on 4/29 with persistent abdominal pain which acutely became worse over the weekend with intractable nausea/vomiting which prompted her to present to the ED. In the ED patient underwent CT abdomen/pelvis which showed no acute changes. CTA negative for PE. WBC 6.6, lactic acid 0.8, Cr 1.02, afebrile, all VSS. ED physician contacted [facility] and spoke with a surgical fellow who recommended to admit to [facility] for medical management.  5/3: still pain and poor PO intake.. General Surgery consulted who consulted GI. UGI ordered: shows contrast from the gastric remnant through the gastrojejunostomy into the small bowel without evidence of anastomotic stricture or leak   A & P   Principal Problem:   Intractable abdominal pain Active Problems:   Anemia   Iron deficiency   Seizures (HCC)   Morbid (severe) obesity due to excess calories (HCC)   GERD (gastroesophageal reflux disease)   Sarcoidosis   1. Abdominal pain with poor PO intake and intractable nausea/vomiting s/p recent laparoscopic gastric bypass surgery 04/21/2020 by Dr. Andrey Farmer at [facility] a. CT on admission without any acute postsurgical complications b. Improved but still present abdominal discomfort c. UGI shows contrast from the gastric remnant through the gastrojejunostomy into the small bowel without evidence of anastomotic stricture or leak d. General Surgery: schedule tylenol and neurontin for better pain control, mobilize. Ok to discharge once pain is controlled with outpatient  follow up at [facility]  2. Hypertension a. On home spironolactone b. Acetazolamide held  3. GERD a. On Carafate and Protonix  4. Depression/seizures a. Continue home nortriptyline, citalopram, alprazolam, Adderall and Keppra  5. Asthma not in acute exacerbation a. Continue albuterol and Dulera  6. Iron deficiency anemia on supplements   DVT prophylaxis: Lovenox Family Communication: Patient updated Disposition Plan: Likely can discharge tomorrow if pain controlled and tolerating PO intake. Will need follow up at [facility] is: Inpatient  Remains inpatient appropriate because:Ongoing active pain requiring inpatient pain management   Dispo: The patient is from: Home              Anticipated d/c is to: Home              Anticipated d/c date is: 1 day              Patient currently is not medically stable to d/c.           Pressure injury documentation    None  Consultants  General surgery  GI  Procedures  None  Antibiotics   Anti-infectives (From admission, onward)   None        Subjective   Improved pain but still present on left and now on right abdomen which was not present yesterday. No other issues or complaints.   Objective   Vitals:   04/28/20 2151 04/29/20 0642 04/29/20 0844 04/29/20 1326  BP: 133/76 118/74  123/87  Pulse: 75 66  72  Resp: 17 17  16   Temp: 98.1 F (36.7 C) 98.2 F (36.8 C)  98.4 F (36.9 C)  TempSrc: Oral Oral    SpO2: 96% 96% 96% 100%  Weight:      Height:        Intake/Output Summary (Last 24 hours) at 04/29/2020 1738 Last data filed at 04/29/2020 1346 Gross per 24 hour  Intake 1116.12 ml  Output 1200 ml  Net -83.88 ml   Filed Weights   04/27/20 1600  Weight: (!) 143.8 kg    Examination:  Physical Exam Vitals and nursing note reviewed.  Constitutional:      Appearance: Normal appearance. She is obese.  HENT:     Head: Normocephalic and atraumatic.  Eyes:     Conjunctiva/sclera: Conjunctivae normal.    Cardiovascular:     Rate and Rhythm: Normal rate and regular rhythm.  Pulmonary:     Effort: Pulmonary effort is normal.     Breath sounds: Normal breath sounds.  Abdominal:     Tenderness: There is abdominal tenderness in the right upper quadrant and left upper quadrant.  Musculoskeletal:        General: No swelling or tenderness.  Skin:    Coloration: Skin is not jaundiced or pale.  Neurological:     Mental Status: She is alert. Mental status is at baseline.  Psychiatric:        Mood and Affect: Mood normal.        Behavior: Behavior normal.     Data Reviewed: I have personally reviewed following labs and imaging studies  CBC: Recent Labs  Lab 04/27/20 0829 04/28/20 0317 04/29/20 0458  WBC 6.6 5.9 5.1  HGB 11.6* 10.1* 10.8*  HCT 37.0 32.1* 34.0*  MCV 87.9 87.5 88.8  PLT 250 231 [id number]   Basic Metabolic Panel: Recent Labs  Lab 04/27/20 0829 04/28/20 0317 04/29/20 0458  NA 137 137 137  K 3.4* 3.6 3.6  CL 101 103 103  CO2 28 26 25   GLUCOSE 86 115* 99  BUN 11 8 6   CREATININE 1.02* 0.84 0.85  CALCIUM 8.6* 8.3* 8.4*   GFR: Estimated Creatinine Clearance: 126.9 mL/min (by C-G formula based on SCr of 0.85 mg/dL). Liver Function Tests: Recent Labs  Lab 04/27/20 0829  AST 19  ALT 17  ALKPHOS 57  BILITOT 0.6  PROT 7.3  ALBUMIN 3.8   Recent Labs  Lab 04/27/20 0829  LIPASE 21   No results for input(s): AMMONIA in the last 168 hours. Coagulation Profile: No results for input(s): INR, PROTIME in the last 168 hours. Cardiac Enzymes: No results for input(s): CKTOTAL, CKMB, CKMBINDEX, TROPONINI in the last 168 hours. BNP (last 3 results) No results for input(s): PROBNP in the last 8760 hours. HbA1C: Recent Labs    04/28/20 0317  HGBA1C 6.0*   CBG: Recent Labs  Lab 04/28/20 1655 04/28/20 2155 04/29/20 0729 04/29/20 1126 04/29/20 1628  GLUCAP 87 85 86 72 106*   Lipid Profile: No results for input(s): CHOL, HDL, LDLCALC, TRIG, CHOLHDL, LDLDIRECT  in the last 72 hours. Thyroid Function Tests: No results for input(s): TSH, T4TOTAL, FREET4, T3FREE, THYROIDAB in the last 72 hours. Anemia Panel: No results for input(s): VITAMINB12, FOLATE, FERRITIN, TIBC, IRON, RETICCTPCT in the last 72 hours. Sepsis Labs: Recent Labs  Lab 04/27/20 0914  LATICACIDVEN 0.8    Recent Results (from the past 240 hour(s))  Blood culture (routine x 2)     Status: None (Preliminary result)   Collection Time: 04/27/20  9:14 AM   Specimen: BLOOD LEFT FOREARM  Result Value Ref Range Status   Specimen Description  Final    BLOOD LEFT FOREARM Performed at [facility] [address] [place] [place], [place] [address]    Special Requests   Final    BOTTLES DRAWN AEROBIC AND ANAEROBIC Blood Culture adequate volume Performed at [facility] [address] [place] [address] [place], [place] [address]    Culture   Final    NO GROWTH 2 DAYS Performed at [facility] [address] [place], [place] [address]    Report Status PENDING  Incomplete  Respiratory Panel by RT PCR (Flu A&B, Covid) - Nasopharyngeal Swab     Status: None   Collection Time: 04/27/20 12:22 PM   Specimen: Nasopharyngeal Swab  Result Value Ref Range Status   SARS Coronavirus 2 by RT PCR NEGATIVE NEGATIVE Final    Comment: (NOTE) SARS-CoV-2 target nucleic acids are NOT DETECTED. The SARS-CoV-2 RNA is generally detectable in upper respiratoy specimens during the acute phase of infection. The lowest concentration of SARS-CoV-2 viral copies this assay can detect is 131 copies/mL. A negative result does not preclude SARS-Cov-2 infection and should not be used as the sole basis for treatment or other patient management decisions. A negative result may occur with  improper specimen collection/handling, submission of specimen other than nasopharyngeal swab, presence of viral mutation(s) within the areas targeted by this assay, and inadequate number of viral  copies (<131 copies/mL). A negative result must be combined with clinical observations, patient history, and epidemiological information. The expected result is Negative. Fact Sheet for Patients:  PinkCheek.be Fact Sheet for Healthcare Providers:  GravelBags.it This test is not yet ap proved or cleared by the [place] FDA and  has been authorized for detection and/or diagnosis of SARS-CoV-2 by FDA under an Emergency Use Authorization (EUA). This EUA will remain  in effect (meaning this test can be used) for the duration of the COVID-19 declaration under Section 564(b)(1) of the Act, 21 U.S.C. section 360bbb-3(b)(1), unless the authorization is terminated or revoked sooner.    Influenza A by PCR NEGATIVE NEGATIVE Final   Influenza B by PCR NEGATIVE NEGATIVE Final    Comment: (NOTE) The Xpert Xpress SARS-CoV-2/FLU/RSV assay is intended as an aid in  the diagnosis of influenza from Nasopharyngeal swab specimens and  should not be used as a sole basis for treatment. Nasal washings and  aspirates are unacceptable for Xpert Xpress SARS-CoV-2/FLU/RSV  testing. Fact Sheet for Patients: PinkCheek.be Fact Sheet for Healthcare Providers: GravelBags.it This test is not yet approved or cleared by the [place] FDA and  has been authorized for detection and/or diagnosis of SARS-CoV-2 by  FDA under an Emergency Use Authorization (EUA). This EUA will remain  in effect (meaning this test can be used) for the duration of the  Covid-19 declaration under Section 564(b)(1) of the Act, 21  U.S.C. section 360bbb-3(b)(1), unless the authorization is  terminated or revoked. Performed at [facility], [place] [address] [place], [place] [address]   Blood culture (routine x 2)     Status: None (Preliminary result)   Collection Time: 04/27/20  4:13 PM   Specimen:  BLOOD  Result Value Ref Range Status   Specimen Description   Final    BLOOD RIGHT ARM Performed at [facility] [address], [place], [place] [address]    Special Requests   Final    BOTTLES DRAWN AEROBIC AND ANAEROBIC Blood Culture adequate volume Performed at [facility] [address] [place],  [address]  Culture   Final    NO GROWTH 2 DAYS Performed at [facility], [place] [address] [place], [place] [address]    Report Status PENDING  Incomplete         Radiology Studies: DG UGI W SINGLE CM (SOL OR THIN BA)  Result Date: 04/28/2020 CLINICAL DATA:  44 year old female status post laparoscopic gastric bypass surgery presenting with persistent postoperative abdominal pain. EXAM: WATER SOLUBLE UPPER GI SERIES TECHNIQUE: Single-column upper GI series was performed using water soluble contrast. CONTRAST:  150 mL of Omnipaque 300. COMPARISON:  No priors. FLUOROSCOPY TIME:  Fluoroscopy Time:  1 minutes and 36 seconds Radiation Exposure Index (if provided by the fluoroscopic device): 49.6 mGy Number of Acquired Spot Images: 0 FINDINGS: Preprocedural KUB demonstrates a nonobstructive bowel gas pattern. No pneumoperitoneum. Surgical clips project over the epigastric region. Single contrast images of the esophagus demonstrate normal esophageal anatomy, with no definite esophageal mass, stricture or esophageal ring. No hiatal hernia. Images of the upper abdomen demonstrate expected postoperative changes of Roux-en-Y gastric bypass. Contrast extended from the gastric remnant through the gastrojejunostomy into the small bowel, without evidence of anastomotic stricture or leak. No extravasation of contrast material was noted at any time during the examination. IMPRESSION: 1. Expected postoperative findings of Roux-en-Y gastric bypass without evidence of acute complicating features, as above. Electronically Signed   By: Vinnie Langton M.D.   On:  04/28/2020 15:35        Scheduled Meds: . acetaminophen  1,000 mg Oral Q8H  . citalopram  20 mg Oral Daily  . enoxaparin (LOVENOX) injection  40 mg Subcutaneous Q24H  . ferrous sulfate  325 mg Oral Q breakfast  . gabapentin  200 mg Oral BID  . insulin aspart  0-15 Units Subcutaneous TID WC  . insulin aspart  0-5 Units Subcutaneous QHS  . levETIRAcetam  500 mg Oral QHS  . metoCLOPramide  5 mg Oral TID AC  . mometasone-formoterol  2 puff Inhalation BID  . nortriptyline  50 mg Oral QHS  . pantoprazole  40 mg Oral Daily  . polyethylene glycol  17 g Oral Daily  . sodium chloride flush  3 mL Intravenous Once  . spironolactone  25 mg Oral Daily   Continuous Infusions: . dextrose 5% lactated ringers Stopped (04/28/20 0939)  . lactated ringers 75 mL/hr at 04/28/20 2356     Time spent: 25 minutes with over 50% of the time coordinating the patient's care    Harold Hedge, DO [facility] Pager [phone]  Call night coverage person covering after 7pm

## 2020-04-29 NOTE — Progress Notes (Signed)
[facility] Progress Note     Subjective: CC-  States that she is feeling a little better this morning, but she does still hurt. Pain is mostly in the LUQ, a little in the RUQ today as well. Some nausea last night, no emesis. Passed a little gas this morning, no BM. She is asking for something to eat other than clear liquids.  Objective: Vital signs in last 24 hours: Temp:  [98.1 F (36.7 C)-98.6 F (37 C)] 98.2 F (36.8 C) (05/04 0642) Pulse Rate:  [66-78] 66 (05/04 0642) Resp:  [16-18] 17 (05/04 0642) BP: (118-133)/(60-93) 118/74 (05/04 0642) SpO2:  [94 %-98 %] 96 % (05/04 0844) Last BM Date: 04/20/20  Intake/Output from previous day: 05/03 0701 - 05/04 0700 In: 1608.9 [P.O.:480; I.V.:1128.9] Out: 2000 [Urine:2000] Intake/Output this shift: No intake/output data recorded.  PE: Gen:  Alert, NAD, pleasant HEENT: EOM's intact, pupils equal and round Card:  RRR Pulm:  CTAB, no W/R/R, rate and effort normal Abd: obese, soft, well healed laparoscopic incisions, TTP left and right upper quadrants L>R with some voluntary guarding, no peritonitis, +BS Skin: no rashes noted, warm and dry  Lab Results:  Recent Labs    04/28/20 0317 04/29/20 0458  WBC 5.9 5.1  HGB 10.1* 10.8*  HCT 32.1* 34.0*  PLT 231 219   BMET Recent Labs    04/28/20 0317 04/29/20 0458  NA 137 137  K 3.6 3.6  CL 103 103  CO2 26 25  GLUCOSE 115* 99  BUN 8 6  CREATININE 0.84 0.85  CALCIUM 8.3* 8.4*   PT/INR No results for input(s): LABPROT, INR in the last 72 hours. CMP     Component Value Date/Time   NA 137 04/29/2020 0458   NA 144 09/25/2018 1317   K 3.6 04/29/2020 0458   CL 103 04/29/2020 0458   CO2 25 04/29/2020 0458   GLUCOSE 99 04/29/2020 0458   BUN 6 04/29/2020 0458   BUN 15 09/25/2018 1317   CREATININE 0.85 04/29/2020 0458   CREATININE 0.96 08/22/2019 1540   CALCIUM 8.4 (L) 04/29/2020 0458   PROT 7.3 04/27/2020 0829   PROT 7.0 11/24/2017 1629   ALBUMIN 3.8  04/27/2020 0829   ALBUMIN 4.3 11/24/2017 1629   AST 19 04/27/2020 0829   ALT 17 04/27/2020 0829   ALKPHOS 57 04/27/2020 0829   BILITOT 0.6 04/27/2020 0829   BILITOT <0.2 11/24/2017 1629   GFRNONAA >60 04/29/2020 0458   GFRNONAA 71 08/22/2019 1540   GFRAA >60 04/29/2020 0458   GFRAA 83 08/22/2019 1540   Lipase     Component Value Date/Time   LIPASE 21 04/27/2020 0829       Studies/Results: CT Angio Chest PE W and/or Wo Contrast  Result Date: 04/27/2020 CLINICAL DATA:  Gastric bypass surgery 04/21/2020. Elevated D-dimer. Intermittent nausea and left lower quadrant pain. EXAM: CT ANGIOGRAPHY CHEST CT ABDOMEN AND PELVIS WITH CONTRAST TECHNIQUE: Multidetector CT imaging of the chest was performed using the standard protocol during bolus administration of intravenous contrast. Multiplanar CT image reconstructions and MIPs were obtained to evaluate the vascular anatomy. Multidetector CT imaging of the abdomen and pelvis was performed using the standard protocol during bolus administration of intravenous contrast. CONTRAST:  141mL OMNIPAQUE IOHEXOL 350 MG/ML SOLN COMPARISON:  09/14/2019 04/18/2019 CT abdomen/pelvis. 11/12/2014 chest CT angiogram. Chest radiograph. FINDINGS: CTA CHEST FINDINGS Cardiovascular: The study is moderate quality for the evaluation of pulmonary embolism, with some degradation by motion and habitus. There are no filling defects  in the central, lobar, segmental or subsegmental pulmonary artery branches to suggest acute pulmonary embolism. Great vessels are normal in course and caliber. Top-normal heart size. No significant pericardial fluid/thickening. Mediastinum/Nodes: No discrete thyroid nodules. Unremarkable esophagus. No pathologically enlarged axillary, mediastinal or hilar lymph nodes. Lungs/Pleura: No pneumothorax. Trace dependent left pleural effusion. No right pleural effusion. No acute consolidative airspace disease, lung masses or significant pulmonary nodules.  Musculoskeletal: No aggressive appearing focal osseous lesions. Mild thoracic spondylosis. Review of the MIP images confirms the above findings. CT ABDOMEN and PELVIS FINDINGS Hepatobiliary: Normal liver with no liver mass. Normal gallbladder with no radiopaque cholelithiasis. No biliary ductal dilatation. CBD diameter 5 mm. Pancreas: Normal, with no mass or duct dilation. Spleen: Normal size. No mass. Adrenals/Urinary Tract: Normal adrenals. Normal kidneys with no hydronephrosis and no renal mass. Normal bladder. 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 Gastrojejunostomy appears grossly intact. Excluded distal stomach contains mild fluid. No definite gastric wall thickening or pneumatosis. Enteroenterostomy noted in the right abdomen. No dilated or thick-walled small bowel loops. Normal appendix. Moderate colorectal stool. No large bowel wall thickening, diverticulosis or pericolonic fat stranding. Vascular/Lymphatic: Atherosclerotic nonaneurysmal abdominal aorta. Patent portal, splenic, hepatic and renal veins. No pathologically enlarged lymph nodes in the abdomen or pelvis. Reproductive: Status post hysterectomy, with no abnormal findings at the vaginal cuff. No adnexal mass. Other: No pneumoperitoneum. No focal fluid collection. Small amount of simple free fluid in the pelvis. Mild subcutaneous fat stranding at the presumed port sites in ventral abdominal wall bilaterally. Musculoskeletal: No aggressive appearing focal osseous lesions. Review of the MIP images confirms the above findings. IMPRESSION: 1. No evidence of pulmonary embolism. Trace dependent left pleural effusion. No active pulmonary disease. 2. 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 No evidence of bowel complication. No free air. No appreciable abscess. 3. Small amount of simple free  fluid in the pelvis. 4. Aortic Atherosclerosis (ICD10-I70.0). Electronically Signed   By: Ilona Sorrel M.D.   On: 04/27/2020 11:36   CT ABDOMEN PELVIS W CONTRAST  Result Date: 04/27/2020 CLINICAL DATA:  Gastric bypass surgery 04/21/2020. Elevated D-dimer. Intermittent nausea and left lower quadrant pain. EXAM: CT ANGIOGRAPHY CHEST CT ABDOMEN AND PELVIS WITH CONTRAST TECHNIQUE: Multidetector CT imaging of the chest was performed using the standard protocol during bolus administration of intravenous contrast. Multiplanar CT image reconstructions and MIPs were obtained to evaluate the vascular anatomy. Multidetector CT imaging of the abdomen and pelvis was performed using the standard protocol during bolus administration of intravenous contrast. CONTRAST:  148mL OMNIPAQUE IOHEXOL 350 MG/ML SOLN COMPARISON:  09/14/2019 04/18/2019 CT abdomen/pelvis. 11/12/2014 chest CT angiogram. Chest radiograph. FINDINGS: CTA CHEST FINDINGS Cardiovascular: The study is moderate quality for the evaluation of pulmonary embolism, with some degradation by motion and habitus. There are no filling defects in the central, lobar, segmental or subsegmental pulmonary artery branches to suggest acute pulmonary embolism. Great vessels are normal in course and caliber. Top-normal heart size. No significant pericardial fluid/thickening. Mediastinum/Nodes: No discrete thyroid nodules. Unremarkable esophagus. No pathologically enlarged axillary, mediastinal or hilar lymph nodes. Lungs/Pleura: No pneumothorax. Trace dependent left pleural effusion. No right pleural effusion. No acute consolidative airspace disease, lung masses or significant pulmonary nodules. Musculoskeletal: No aggressive appearing focal osseous lesions. Mild thoracic spondylosis. Review of the MIP images confirms the above findings. CT ABDOMEN and PELVIS FINDINGS Hepatobiliary: Normal liver with no liver mass. Normal gallbladder with no  radiopaque cholelithiasis. No biliary  ductal dilatation. CBD diameter 5 mm. Pancreas: Normal, with no mass or duct dilation. Spleen: Normal size. No mass. Adrenals/Urinary Tract: Normal adrenals. Normal kidneys with no hydronephrosis and no renal mass. Normal bladder. 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 Gastrojejunostomy appears grossly intact. Excluded distal stomach contains mild fluid. No definite gastric wall thickening or pneumatosis. Enteroenterostomy noted in the right abdomen. No dilated or thick-walled small bowel loops. Normal appendix. Moderate colorectal stool. No large bowel wall thickening, diverticulosis or pericolonic fat stranding. Vascular/Lymphatic: Atherosclerotic nonaneurysmal abdominal aorta. Patent portal, splenic, hepatic and renal veins. No pathologically enlarged lymph nodes in the abdomen or pelvis. Reproductive: Status post hysterectomy, with no abnormal findings at the vaginal cuff. No adnexal mass. Other: No pneumoperitoneum. No focal fluid collection. Small amount of simple free fluid in the pelvis. Mild subcutaneous fat stranding at the presumed port sites in ventral abdominal wall bilaterally. Musculoskeletal: No aggressive appearing focal osseous lesions. Review of the MIP images confirms the above findings. IMPRESSION: 1. No evidence of pulmonary embolism. Trace dependent left pleural effusion. No active pulmonary disease. 2. 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 No evidence of bowel complication. No free air. No appreciable abscess. 3. Small amount of simple free fluid in the pelvis. 4. Aortic Atherosclerosis (ICD10-I70.0). Electronically Signed   By: Ilona Sorrel M.D.   On: 04/27/2020 11:36   DG UGI W SINGLE CM (SOL OR THIN BA)  Result Date: 04/28/2020 CLINICAL DATA:  44 year old female status post laparoscopic gastric bypass surgery presenting  with persistent postoperative abdominal pain. EXAM: WATER SOLUBLE UPPER GI SERIES TECHNIQUE: Single-column upper GI series was performed using water soluble contrast. CONTRAST:  150 mL of Omnipaque 300. COMPARISON:  No priors. FLUOROSCOPY TIME:  Fluoroscopy Time:  1 minutes and 36 seconds Radiation Exposure Index (if provided by the fluoroscopic device): 49.6 mGy Number of Acquired Spot Images: 0 FINDINGS: Preprocedural KUB demonstrates a nonobstructive bowel gas pattern. No pneumoperitoneum. Surgical clips project over the epigastric region. Single contrast images of the esophagus demonstrate normal esophageal anatomy, with no definite esophageal mass, stricture or esophageal ring. No hiatal hernia. Images of the upper abdomen demonstrate expected postoperative changes of Roux-en-Y gastric bypass. Contrast extended from the gastric remnant through the gastrojejunostomy into the small bowel, without evidence of anastomotic stricture or leak. No extravasation of contrast material was noted at any time during the examination. IMPRESSION: 1. Expected postoperative findings of Roux-en-Y gastric bypass without evidence of acute complicating features, as above. Electronically Signed   By: Vinnie Langton M.D.   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 Andrey Farmer at [place] without any acute postsurgical complications noted. I reviewed this again with radiology today to confirm that there are no signs of internal hernia as her J-J is noted to be in the RUQ. No signs of internal hernia - UGI shows contrast from the gastric remnant through the gastrojejunostomy into the small bowel without evidence of  anastomotic stricture or leak - WBC WNL, afebrile, VSS - Schedule tylenol and neurontin for better pain control. She received miralax yesterday and is  passing some flatus this morning but has not had a BM since surgery. Give milk of magnesia today. Mobilize.   ID - none VTE - SCDs, lovenox FEN - IVF, Bariatric fulls Foley - none Follow up - [facility]   LOS: 2 days    [place], [facility] 04/29/2020, 9:08 AM Please see Amion for pager number during day hours 7:00am-4:30pm

## 2020-04-30 LAB — GLUCOSE, CAPILLARY
Glucose-Capillary: 68 mg/dL — ABNORMAL LOW (ref 70–99)
Glucose-Capillary: 74 mg/dL (ref 70–99)
Glucose-Capillary: 61 mg/dL — ABNORMAL LOW (ref 70–99)

## 2020-04-30 NOTE — Progress Notes (Signed)
[facility] Progress Note     Subjective: CC-  Feeling much better today. She had a good BM yesterday. Tolerating bariatric fulls. Denies abdominal pain, nausea, vomiting.  Objective: Vital signs in last 24 hours: Temp:  [97.9 F (36.6 C)-98.4 F (36.9 C)] 97.9 F (36.6 C) (05/05 0550) Pulse Rate:  [72-79] 79 (05/05 0550) Resp:  [16-18] 18 (05/05 0550) BP: (123-141)/(66-98) 141/98 (05/05 0550) SpO2:  [96 %-100 %] 99 % (05/05 0550) Last BM Date: 04/29/20  Intake/Output from previous day: 05/04 0701 - 05/05 0700 In: 300 [P.O.:300] Out: 1250 [Urine:1250] Intake/Output this shift: No intake/output data recorded.  PE: Gen:  Alert, NAD, pleasant HEENT: EOM's intact, pupils equal and round Pulm: rate and effort normal Abd: obese, soft, well healed laparoscopicincisions,nontender, +BS Skin: no rashes noted, warm and dry   Lab Results:  Recent Labs    04/28/20 0317 04/29/20 0458  WBC 5.9 5.1  HGB 10.1* 10.8*  HCT 32.1* 34.0*  PLT 231 219   BMET Recent Labs    04/28/20 0317 04/29/20 0458  NA 137 137  K 3.6 3.6  CL 103 103  CO2 26 25  GLUCOSE 115* 99  BUN 8 6  CREATININE 0.84 0.85  CALCIUM 8.3* 8.4*   PT/INR No results for input(s): LABPROT, INR in the last 72 hours. CMP     Component Value Date/Time   NA 137 04/29/2020 0458   NA 144 09/25/2018 1317   K 3.6 04/29/2020 0458   CL 103 04/29/2020 0458   CO2 25 04/29/2020 0458   GLUCOSE 99 04/29/2020 0458   BUN 6 04/29/2020 0458   BUN 15 09/25/2018 1317   CREATININE 0.85 04/29/2020 0458   CREATININE 0.96 08/22/2019 1540   CALCIUM 8.4 (L) 04/29/2020 0458   PROT 7.3 04/27/2020 0829   PROT 7.0 11/24/2017 1629   ALBUMIN 3.8 04/27/2020 0829   ALBUMIN 4.3 11/24/2017 1629   AST 19 04/27/2020 0829   ALT 17 04/27/2020 0829   ALKPHOS 57 04/27/2020 0829   BILITOT 0.6 04/27/2020 0829   BILITOT <0.2 11/24/2017 1629   GFRNONAA >60 04/29/2020 0458   GFRNONAA 71 08/22/2019 1540   GFRAA >60  04/29/2020 0458   GFRAA 83 08/22/2019 1540   Lipase     Component Value Date/Time   LIPASE 21 04/27/2020 0829       Studies/Results: DG UGI W SINGLE CM (SOL OR THIN BA)  Result Date: 04/28/2020 CLINICAL DATA:  44 year old female status post laparoscopic gastric bypass surgery presenting with persistent postoperative abdominal pain. EXAM: WATER SOLUBLE UPPER GI SERIES TECHNIQUE: Single-column upper GI series was performed using water soluble contrast. CONTRAST:  150 mL of Omnipaque 300. COMPARISON:  No priors. FLUOROSCOPY TIME:  Fluoroscopy Time:  1 minutes and 36 seconds Radiation Exposure Index (if provided by the fluoroscopic device): 49.6 mGy Number of Acquired Spot Images: 0 FINDINGS: Preprocedural KUB demonstrates a nonobstructive bowel gas pattern. No pneumoperitoneum. Surgical clips project over the epigastric region. Single contrast images of the esophagus demonstrate normal esophageal anatomy, with no definite esophageal mass, stricture or esophageal ring. No hiatal hernia. Images of the upper abdomen demonstrate expected postoperative changes of Roux-en-Y gastric bypass. Contrast extended from the gastric remnant through the gastrojejunostomy into the small bowel, without evidence of anastomotic stricture or leak. No extravasation of contrast material was noted at any time during the examination. IMPRESSION: 1. Expected postoperative findings of Roux-en-Y gastric bypass without evidence of acute complicating features, as above. Electronically Signed   By: Quillian Quince  Entrikin M.D.   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 Andrey Farmer at [place] without any acute postsurgical  complications noted. I reviewed this again with radiology today to confirm that there are no signs of internal hernia as her J-J is noted to be in the RUQ. No signs of internal hernia - UGI shows contrast from the gastric remnant through the gastrojejunostomy into the small bowel without evidence of anastomotic stricture or leak - WBC WNL, afebrile, VSS  ID -none VTE -SCDs, lovenox FEN -IVF, Bariatric fulls Foley -none Follow up -[facility]  Plan: Symptoms resolved after having a bowel movement. [place] for discharge. Recommend continuing daily miralax at home with milk of magnesia as needed. Follow up with surgeon at [facility].   LOS: 3 days    [facility] 04/30/2020, 9:17 AM Please see Amion for pager number during day hours 7:00am-4:30pm

## 2020-04-30 NOTE — Progress Notes (Signed)
Discharge instructions were given to patient.  All questions were answered.  Patient was taken to main entrance in wheelchair.

## 2020-04-30 NOTE — Progress Notes (Signed)
Patient asking questions regarding diet after leaving the hospital.  PA requested to provide information for patient.  Provided Phase II liquid diet for patient reference.  To follow up with [facility] dietitian next week encouraged to reach out to dietitians there if further questions.

## 2020-04-30 NOTE — Discharge Summary (Addendum)
Physician Discharge Summary  Victoria Moore [id number] DOB: [birth year] DOA: 04/27/2020  PCP: Victoria Lighter, MD  Admit date: 04/27/2020 Discharge date: 04/30/2020  Admitted From: Home  Disposition:  Home   Recommendations for Outpatient Follow-up and new medication changes:  1. Follow up with Dr. Tressia Moore in 7 days.  2. Follow up with [facility] as scheduled.   Home Health: no   Equipment/Devices: no    Discharge Condition: stable  CODE STATUS: full  Diet recommendation: heart healthy and diabetic prudent.   Brief/Interim Summary: Patient will be admitted to the hospital with a working diagnosis of intractable nausea and vomiting post surgical gastric bypass, failed outpatient therapy.  44 year old female recently discharged from [facility], 3 days after a gastric bypass surgery, laparoscopic procedure, who presents with intractable nausea, vomiting and unable to tolerate clear liquid diet.  On her initial physical examination her blood pressure was 117/93, heart rate  76, respiratory rate 14, oxygen saturation 100% on room air.  She had dry mucous membranes, lungs clear to auscultation bilaterally, heart S1-S2 present and rhythmic, abdomen soft, tender to palpation left lower quadrant, no rebound or guarding. Sodium 137, potassium 3.4, chloride 101, bicarb 28, glucose 86, BUN 11, creatinine 1.0, lipase 21, AST 19, ALT 17, white count 6.6, hemoglobin 11.6, hematocrit 37.0, platelets 250.  D-dimer 4,27.  SARS COVID-19 negative.  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   1.  Intractable nausea and vomiting in the setting of recent gastric bypass surgery, obesity class III.  Patient was admitted to the medical ward, she received intravenous fluids, along with as needed analgesics and antiemetics.  She also received antiacid therapy  and her diet was advanced slowly.  She underwent further work-up with upper GI series using a water soluble contrast, which showed no evidence of acute anastomotic stricture or leak.   By the time of discharge patient's pain had resolved along with her nausea and vomiting.  Tolerating p.o. diet adequately.  Patient will follow up with outpatient surgery as scheduled.  Pain control with acetaminophen, gabapentin, methocarbamol, oxycodone and tramadol.   2.  Hypertension.  Patient was continued on spironolactone, other antihypertensive agents were held. At discharge will resume chlorthalidone.   3.  GERD.  Patient was placed on sucralfate pantoprazole with good toleration.  4.  Asthma.  No signs of acute exacerbation.  Continue bronchodilators, inhaled corticosteroids.   5.  Iron deficiency anemia.  Continue iron supplements  6.  Depression.  Continue nortriptyline, citalopram, alprazolam and Adderall  7. Pre-diabetes. Hgb A1c 6,0 Patient will resume metformin at discharge.   8. OSA. Continue Cpap at home.   Discharge Diagnoses:  Principal Problem:   Intractable abdominal pain Active Problems:   Anemia   Iron deficiency   Seizures (HCC)   Morbid (severe) obesity due to excess calories (HCC)   GERD (gastroesophageal reflux disease)   Sarcoidosis    Discharge Instructions   Allergies as of 04/30/2020      Reactions   Aspirin Anaphylaxis, Hives          Medication List    STOP taking these medications   famotidine 40 MG tablet Commonly known as: Pepcid   levETIRAcetam 500 MG tablet Commonly known as: KEPPRA   meloxicam 7.5 MG tablet Commonly known as: Mobic   sucralfate 1 g tablet Commonly known as: Carafate  TAKE these medications   Acetaminophen Extra Strength 500 MG tablet Generic drug: acetaminophen Take 1,000 mg by mouth every 6 (six) hours as needed for pain.   acetaZOLAMIDE 500 MG capsule Commonly known as: DIAMOX Take 2 capsules (1,000 mg total) by  mouth 2 (two) times daily.   albuterol (2.5 MG/3ML) 0.083% nebulizer solution Commonly known as: PROVENTIL Take 2.5 mg by nebulization every 6 (six) hours as needed for wheezing or shortness of breath.   albuterol 108 (90 Base) MCG/ACT inhaler Commonly known as: VENTOLIN HFA Inhale 1-2 puffs into the lungs every 4 (four) hours as needed for wheezing or shortness of breath.   ALPRAZolam 1 MG tablet Commonly known as: XANAX Take 0.5-1 tablets (0.5-1 mg total) by mouth at bedtime as needed for anxiety.   amphetamine-dextroamphetamine 20 MG 24 hr capsule Commonly known as: ADDERALL XR Take 20 mg by mouth in the morning. What changed: Another medication with the same name was removed. Continue taking this medication, and follow the directions you see here.   chlorthalidone 25 MG tablet Commonly known as: HYGROTON TAKE 1 TABLET BY MOUTH EVERY DAY   citalopram 20 MG tablet Commonly known as: CELEXA TAKE 1 TABLET BY MOUTH EVERY DAY   ferrous sulfate 325 (65 FE) MG tablet Take 325 mg by mouth daily with breakfast.   Fluticasone-Salmeterol 100-50 MCG/DOSE Aepb Commonly known as: Advair Diskus Inhale 1 puff into the lungs 2 (two) times daily.   gabapentin 300 MG capsule Commonly known as: NEURONTIN Take 300 mg by mouth 3 (three) times daily.   hyoscyamine 0.125 MG SL tablet Commonly known as: LEVSIN SL Place 1 tablet (0.125 mg total) under the tongue every 6 (six) hours as needed. What changed: reasons to take this   metFORMIN 500 MG tablet Commonly known as: GLUCOPHAGE Take 500 mg by mouth daily.   methocarbamol 750 MG tablet Commonly known as: ROBAXIN Take 750 mg by mouth 3 (three) times daily.   mometasone-formoterol 100-5 MCG/ACT Aero Commonly known as: DULERA Inhale 2 puffs into the lungs 2 (two) times daily.   nortriptyline 25 MG capsule Commonly known as: PAMELOR Take 2 capsules at bedtime. What changed:   how much to take  how to take this  when to take  this  additional instructions   ondansetron 4 MG disintegrating tablet Commonly known as: ZOFRAN-ODT Take 4 mg by mouth every 8 (eight) hours as needed for nausea/vomiting.   OVER THE COUNTER MEDICATION Take 1 tablet by mouth daily. Bariatric vitamins   oxyCODONE 5 MG immediate release tablet Commonly known as: Oxy IR/ROXICODONE Take 5 mg by mouth 2 (two) times daily as needed for pain.   pantoprazole 40 MG tablet Commonly known as: PROTONIX Take 40 mg by mouth daily.   polyethylene glycol powder 17 GM/SCOOP powder Commonly known as: GLYCOLAX/MIRALAX Take 17 g by mouth daily. Take 1 packet (17g) by mouth once daily for 30 days. Mix in 4-8oz of fluid prior to taking.   scopolamine 1 MG/3DAYS Commonly known as: TRANSDERM-SCOP Place 1 patch onto the skin every 3 (three) days.   spironolactone 25 MG tablet Commonly known as: ALDACTONE TAKE 1 TABLET BY MOUTH EVERY DAY   SUMAtriptan 100 MG tablet Commonly known as: IMITREX TAKE 1 TABLET BY MOUTH 2X DAILY AS NEEDED FOR MIGRAINE. MAY REPEAT IN 2 HOURS IF HEADACHE PERSISTS OR RECURS. What changed: See the new instructions.   testosterone cypionate 200 MG/ML injection Commonly known as: DEPOTESTOSTERONE CYPIONATE Inject 200 mg into the muscle  every 30 (thirty) days.   traMADol 50 MG tablet Commonly known as: ULTRAM Take 50 mg by mouth every 6 (six) hours as needed.       Allergies  Allergen Reactions  . Aspirin Anaphylaxis and Hives         Consultations:  Surgery    Procedures/Studies: CT Angio Chest PE W and/or Wo Contrast  Result Date: 04/27/2020 CLINICAL DATA:  Gastric bypass surgery 04/21/2020. Elevated D-dimer. Intermittent nausea and left lower quadrant pain. EXAM: CT ANGIOGRAPHY CHEST CT ABDOMEN AND PELVIS WITH CONTRAST TECHNIQUE: Multidetector CT imaging of the chest was performed using the standard protocol during bolus administration of intravenous contrast. Multiplanar CT image reconstructions and MIPs  were obtained to evaluate the vascular anatomy. Multidetector CT imaging of the abdomen and pelvis was performed using the standard protocol during bolus administration of intravenous contrast. CONTRAST:  187mL OMNIPAQUE IOHEXOL 350 MG/ML SOLN COMPARISON:  09/14/2019 04/18/2019 CT abdomen/pelvis. 11/12/2014 chest CT angiogram. Chest radiograph. FINDINGS: CTA CHEST FINDINGS Cardiovascular: The study is moderate quality for the evaluation of pulmonary embolism, with some degradation by motion and habitus. There are no filling defects in the central, lobar, segmental or subsegmental pulmonary artery branches to suggest acute pulmonary embolism. Great vessels are normal in course and caliber. Top-normal heart size. No significant pericardial fluid/thickening. Mediastinum/Nodes: No discrete thyroid nodules. Unremarkable esophagus. No pathologically enlarged axillary, mediastinal or hilar lymph nodes. Lungs/Pleura: No pneumothorax. Trace dependent left pleural effusion. No right pleural effusion. No acute consolidative airspace disease, lung masses or significant pulmonary nodules. Musculoskeletal: No aggressive appearing focal osseous lesions. Mild thoracic spondylosis. Review of the MIP images confirms the above findings. CT ABDOMEN and PELVIS FINDINGS Hepatobiliary: Normal liver with no liver mass. Normal gallbladder with no radiopaque cholelithiasis. No biliary ductal dilatation. CBD diameter 5 mm. Pancreas: Normal, with no mass or duct dilation. Spleen: Normal size. No mass. Adrenals/Urinary Tract: Normal adrenals. Normal kidneys with no hydronephrosis and no renal mass. Normal bladder. 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 Gastrojejunostomy appears grossly intact. Excluded distal stomach contains mild fluid. No definite gastric wall thickening or pneumatosis. Enteroenterostomy noted in the right abdomen. No dilated or  thick-walled small bowel loops. Normal appendix. Moderate colorectal stool. No large bowel wall thickening, diverticulosis or pericolonic fat stranding. Vascular/Lymphatic: Atherosclerotic nonaneurysmal abdominal aorta. Patent portal, splenic, hepatic and renal veins. No pathologically enlarged lymph nodes in the abdomen or pelvis. Reproductive: Status post hysterectomy, with no abnormal findings at the vaginal cuff. No adnexal mass. Other: No pneumoperitoneum. No focal fluid collection. Small amount of simple free fluid in the pelvis. Mild subcutaneous fat stranding at the presumed port sites in ventral abdominal wall bilaterally. Musculoskeletal: No aggressive appearing focal osseous lesions. Review of the MIP images confirms the above findings. IMPRESSION: 1. No evidence of pulmonary embolism. Trace dependent left pleural effusion. No active pulmonary disease. 2. 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 No evidence of bowel complication. No free air. No appreciable abscess. 3. Small amount of simple free fluid in the pelvis. 4. Aortic Atherosclerosis (ICD10-I70.0). Electronically Signed   By: Ilona Sorrel M.D.   On: 04/27/2020 11:36   CT ABDOMEN PELVIS W CONTRAST  Result Date: 04/27/2020 CLINICAL DATA:  Gastric bypass surgery 04/21/2020. Elevated D-dimer. Intermittent nausea and left lower quadrant pain. EXAM: CT ANGIOGRAPHY CHEST CT ABDOMEN AND PELVIS WITH CONTRAST TECHNIQUE: Multidetector CT imaging of the chest was performed using  the standard protocol during bolus administration of intravenous contrast. Multiplanar CT image reconstructions and MIPs were obtained to evaluate the vascular anatomy. Multidetector CT imaging of the abdomen and pelvis was performed using the standard protocol during bolus administration of intravenous contrast. CONTRAST:  137mL OMNIPAQUE IOHEXOL 350 MG/ML SOLN COMPARISON:  09/14/2019 04/18/2019 CT  abdomen/pelvis. 11/12/2014 chest CT angiogram. Chest radiograph. FINDINGS: CTA CHEST FINDINGS Cardiovascular: The study is moderate quality for the evaluation of pulmonary embolism, with some degradation by motion and habitus. There are no filling defects in the central, lobar, segmental or subsegmental pulmonary artery branches to suggest acute pulmonary embolism. Great vessels are normal in course and caliber. Top-normal heart size. No significant pericardial fluid/thickening. Mediastinum/Nodes: No discrete thyroid nodules. Unremarkable esophagus. No pathologically enlarged axillary, mediastinal or hilar lymph nodes. Lungs/Pleura: No pneumothorax. Trace dependent left pleural effusion. No right pleural effusion. No acute consolidative airspace disease, lung masses or significant pulmonary nodules. Musculoskeletal: No aggressive appearing focal osseous lesions. Mild thoracic spondylosis. Review of the MIP images confirms the above findings. CT ABDOMEN and PELVIS FINDINGS Hepatobiliary: Normal liver with no liver mass. Normal gallbladder with no radiopaque cholelithiasis. No biliary ductal dilatation. CBD diameter 5 mm. Pancreas: Normal, with no mass or duct dilation. Spleen: Normal size. No mass. Adrenals/Urinary Tract: Normal adrenals. Normal kidneys with no hydronephrosis and no renal mass. Normal bladder. 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 Gastrojejunostomy appears grossly intact. Excluded distal stomach contains mild fluid. No definite gastric wall thickening or pneumatosis. Enteroenterostomy noted in the right abdomen. No dilated or thick-walled small bowel loops. Normal appendix. Moderate colorectal stool. No large bowel wall thickening, diverticulosis or pericolonic fat stranding. Vascular/Lymphatic: Atherosclerotic nonaneurysmal abdominal aorta. Patent portal, splenic, hepatic and renal veins. No pathologically  enlarged lymph nodes in the abdomen or pelvis. Reproductive: Status post hysterectomy, with no abnormal findings at the vaginal cuff. No adnexal mass. Other: No pneumoperitoneum. No focal fluid collection. Small amount of simple free fluid in the pelvis. Mild subcutaneous fat stranding at the presumed port sites in ventral abdominal wall bilaterally. Musculoskeletal: No aggressive appearing focal osseous lesions. Review of the MIP images confirms the above findings. IMPRESSION: 1. No evidence of pulmonary embolism. Trace dependent left pleural effusion. No active pulmonary disease. 2. 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 No evidence of bowel complication. No free air. No appreciable abscess. 3. Small amount of simple free fluid in the pelvis. 4. Aortic Atherosclerosis (ICD10-I70.0). Electronically Signed   By: Ilona Sorrel M.D.   On: 04/27/2020 11:36   DG UGI W SINGLE CM (SOL OR THIN BA)  Result Date: 04/28/2020 CLINICAL DATA:  44 year old female status post laparoscopic gastric bypass surgery presenting with persistent postoperative abdominal pain. EXAM: WATER SOLUBLE UPPER GI SERIES TECHNIQUE: Single-column upper GI series was performed using water soluble contrast. CONTRAST:  150 mL of Omnipaque 300. COMPARISON:  No priors. FLUOROSCOPY TIME:  Fluoroscopy Time:  1 minutes and 36 seconds Radiation Exposure Index (if provided by the fluoroscopic device): 49.6 mGy Number of Acquired Spot Images: 0 FINDINGS: Preprocedural KUB demonstrates a nonobstructive bowel gas pattern. No pneumoperitoneum. Surgical clips project over the epigastric region. Single contrast images of the esophagus demonstrate normal esophageal anatomy, with no definite esophageal mass, stricture or esophageal ring. No hiatal hernia. Images of the upper abdomen demonstrate expected postoperative changes of Roux-en-Y gastric bypass. Contrast extended from the gastric  remnant through the gastrojejunostomy  into the small bowel, without evidence of anastomotic stricture or leak. No extravasation of contrast material was noted at any time during the examination. IMPRESSION: 1. Expected postoperative findings of Roux-en-Y gastric bypass without evidence of acute complicating features, as above. Electronically Signed   By: Vinnie Langton M.D.   On: 04/28/2020 15:35      Subjective: Patient is feeling better, no nausea or vomiting, tolerating po well, no abdominal pain.   Discharge Exam: Vitals:   04/29/20 2152 04/30/20 0550  BP: 127/66 (!) 141/98  Pulse: 77 79  Resp: 18 18  Temp:  97.9 F (36.6 C)  SpO2: 97% 99%   Vitals:   04/29/20 1947 04/29/20 1954 04/29/20 2152 04/30/20 0550  BP:   127/66 (!) 141/98  Pulse:  76 77 79  Resp:  18 18 18   Temp:    97.9 F (36.6 C)  TempSrc:    Oral  SpO2: 96% 96% 97% 99%  Weight:      Height:        General: Not in pain or dyspnea  Neurology: Awake and alert, non focal  E ENT: no pallor, no icterus, oral mucosa moist Cardiovascular: No JVD. S1-S2 present, rhythmic, no gallops, rubs, or murmurs. No lower extremity edema. Pulmonary: vesicular breath sounds bilaterally, adequate air movement, no wheezing, rhonchi or rales. Gastrointestinal. Abdomen with no organomegaly, non tender, no rebound or guarding Skin. No rashes Musculoskeletal: no joint deformities   The results of significant diagnostics from this hospitalization (including imaging, microbiology, ancillary and laboratory) are listed below for reference.     Microbiology: Recent Results (from the past 240 hour(s))  Blood culture (routine x 2)     Status: None (Preliminary result)   Collection Time: 04/27/20  9:14 AM   Specimen: BLOOD LEFT FOREARM  Result Value Ref Range Status   Specimen Description   Final    BLOOD LEFT FOREARM Performed at [facility] [address] [place], [place] [address]    Special Requests    Final    BOTTLES DRAWN AEROBIC AND ANAEROBIC Blood Culture adequate volume Performed at [facility] [address], [place], [place] [address]    Culture   Final    NO GROWTH 3 DAYS Performed at [facility], [place] [address], [place], [place] [address]    Report Status PENDING  Incomplete  Respiratory Panel by RT PCR (Flu A&B, Covid) - Nasopharyngeal Swab     Status: None   Collection Time: 04/27/20 12:22 PM   Specimen: Nasopharyngeal Swab  Result Value Ref Range Status   SARS Coronavirus 2 by RT PCR NEGATIVE NEGATIVE Final    Comment: (NOTE) SARS-CoV-2 target nucleic acids are NOT DETECTED. The SARS-CoV-2 RNA is generally detectable in upper respiratoy specimens during the acute phase of infection. The lowest concentration of SARS-CoV-2 viral copies this assay can detect is 131 copies/mL. A negative result does not preclude SARS-Cov-2 infection and should not be used as the sole basis for treatment or other patient management decisions. A negative result may occur with  improper specimen collection/handling, submission of specimen other than nasopharyngeal swab, presence of viral mutation(s) within the areas targeted by this assay, and inadequate number of viral copies (<131 copies/mL). A negative result must be combined with clinical observations, patient history, and epidemiological information. The expected result is Negative. Fact Sheet for Patients:  PinkCheek.be Fact Sheet for Healthcare Providers:  GravelBags.it This test is not yet ap proved or cleared by  the [employer] and  has been authorized for detection and/or diagnosis of SARS-CoV-2 by FDA under an Emergency Use Authorization (EUA). This EUA will remain  in effect (meaning this test can be used) for the duration of the COVID-19 declaration under Section 564(b)(1) of the Act, 21 U.S.C. section 360bbb-3(b)(1), unless the  authorization is terminated or revoked sooner.    Influenza A by PCR NEGATIVE NEGATIVE Final   Influenza B by PCR NEGATIVE NEGATIVE Final    Comment: (NOTE) The Xpert Xpress SARS-CoV-2/FLU/RSV assay is intended as an aid in  the diagnosis of influenza from Nasopharyngeal swab specimens and  should not be used as a sole basis for treatment. Nasal washings and  aspirates are unacceptable for Xpert Xpress SARS-CoV-2/FLU/RSV  testing. Fact Sheet for Patients: PinkCheek.be Fact Sheet for Healthcare Providers: GravelBags.it This test is not yet approved or cleared by the [place] FDA and  has been authorized for detection and/or diagnosis of SARS-CoV-2 by  FDA under an Emergency Use Authorization (EUA). This EUA will remain  in effect (meaning this test can be used) for the duration of the  Covid-19 declaration under Section 564(b)(1) of the Act, 21  U.S.C. section 360bbb-3(b)(1), unless the authorization is  terminated or revoked. Performed at [facility], [place] [address], [place], [place] [address]   Blood culture (routine x 2)     Status: None (Preliminary result)   Collection Time: 04/27/20  4:13 PM   Specimen: BLOOD  Result Value Ref Range Status   Specimen Description   Final    BLOOD RIGHT ARM Performed at [facility] [address], [place], [place] [address]    Special Requests   Final    BOTTLES DRAWN AEROBIC AND ANAEROBIC Blood Culture adequate volume Performed at [facility] [address] [place], [place] [address]    Culture   Final    NO GROWTH 3 DAYS Performed at [facility], [place] [address] [place], [place] [address]    Report Status PENDING  Incomplete     Labs: BNP (last 3 results) No results for input(s): BNP in the last 8760 hours. Basic Metabolic Panel: Recent Labs  Lab 04/27/20 0829 04/28/20 0317 04/29/20 0458  NA 137  137 137  K 3.4* 3.6 3.6  CL 101 103 103  CO2 28 26 25   GLUCOSE 86 115* 99  BUN 11 8 6   CREATININE 1.02* 0.84 0.85  CALCIUM 8.6* 8.3* 8.4*   Liver Function Tests: Recent Labs  Lab 04/27/20 0829  AST 19  ALT 17  ALKPHOS 57  BILITOT 0.6  PROT 7.3  ALBUMIN 3.8   Recent Labs  Lab 04/27/20 0829  LIPASE 21   No results for input(s): AMMONIA in the last 168 hours. CBC: Recent Labs  Lab 04/27/20 0829 04/28/20 0317 04/29/20 0458  WBC 6.6 5.9 5.1  HGB 11.6* 10.1* 10.8*  HCT 37.0 32.1* 34.0*  MCV 87.9 87.5 88.8  PLT 250 231 219   Cardiac Enzymes: No results for input(s): CKTOTAL, CKMB, CKMBINDEX, TROPONINI in the last 168 hours. BNP: Invalid input(s): POCBNP CBG: Recent Labs  Lab 04/29/20 1126 04/29/20 1628 04/29/20 2255 04/30/20 0746 04/30/20 0823  GLUCAP 72 106* 85 68* 74   D-Dimer No results for input(s): DDIMER in the last 72 hours. Hgb A1c Recent Labs    04/28/20 0317  HGBA1C 6.0*   Lipid Profile No results for input(s): CHOL, HDL, LDLCALC, TRIG, CHOLHDL, LDLDIRECT in the  last 72 hours. Thyroid function studies No results for input(s): TSH, T4TOTAL, T3FREE, THYROIDAB in the last 72 hours.  Invalid input(s): FREET3 Anemia work up No results for input(s): VITAMINB12, FOLATE, FERRITIN, TIBC, IRON, RETICCTPCT in the last 72 hours. Urinalysis    Component Value Date/Time   COLORURINE YELLOW 04/27/2020 0829   APPEARANCEUR CLEAR 04/27/2020 0829   LABSPEC 1.010 04/27/2020 0829   LABSPEC 1.015 11/27/2018 1008   PHURINE 5.0 04/27/2020 0829   GLUCOSEU NEGATIVE 04/27/2020 0829   HGBUR NEGATIVE 04/27/2020 0829   BILIRUBINUR NEGATIVE 04/27/2020 0829   BILIRUBINUR moderate 11/07/2019 1619   KETONESUR 20 (A) 04/27/2020 0829   PROTEINUR NEGATIVE 04/27/2020 0829   UROBILINOGEN 0.2 11/07/2019 1619   UROBILINOGEN 0.2 07/15/2019 1213   NITRITE NEGATIVE 04/27/2020 0829   LEUKOCYTESUR MODERATE (A) 04/27/2020 0829   Sepsis Labs Invalid input(s): PROCALCITONIN,   WBC,  LACTICIDVEN Microbiology Recent Results (from the past 240 hour(s))  Blood culture (routine x 2)     Status: None (Preliminary result)   Collection Time: 04/27/20  9:14 AM   Specimen: BLOOD LEFT FOREARM  Result Value Ref Range Status   Specimen Description   Final    BLOOD LEFT FOREARM Performed at [facility], [place] [address], [place], [place] [address]    Special Requests   Final    BOTTLES DRAWN AEROBIC AND ANAEROBIC Blood Culture adequate volume Performed at [facility] [address], [place], [place] [address]    Culture   Final    NO GROWTH 3 DAYS Performed at [facility], [place] [address], [place], [place] [address]    Report Status PENDING  Incomplete  Respiratory Panel by RT PCR (Flu A&B, Covid) - Nasopharyngeal Swab     Status: None   Collection Time: 04/27/20 12:22 PM   Specimen: Nasopharyngeal Swab  Result Value Ref Range Status   SARS Coronavirus 2 by RT PCR NEGATIVE NEGATIVE Final    Comment: (NOTE) SARS-CoV-2 target nucleic acids are NOT DETECTED. The SARS-CoV-2 RNA is generally detectable in upper respiratoy specimens during the acute phase of infection. The lowest concentration of SARS-CoV-2 viral copies this assay can detect is 131 copies/mL. A negative result does not preclude SARS-Cov-2 infection and should not be used as the sole basis for treatment or other patient management decisions. A negative result may occur with  improper specimen collection/handling, submission of specimen other than nasopharyngeal swab, presence of viral mutation(s) within the areas targeted by this assay, and inadequate number of viral copies (<131 copies/mL). A negative result must be combined with clinical observations, patient history, and epidemiological information. The expected result is Negative. Fact Sheet for Patients:  PinkCheek.be Fact Sheet for Healthcare Providers:   GravelBags.it This test is not yet ap proved or cleared by the [place] FDA and  has been authorized for detection and/or diagnosis of SARS-CoV-2 by FDA under an Emergency Use Authorization (EUA). This EUA will remain  in effect (meaning this test can be used) for the duration of the COVID-19 declaration under Section 564(b)(1) of the Act, 21 U.S.C. section 360bbb-3(b)(1), unless the authorization is terminated or revoked sooner.    Influenza A by PCR NEGATIVE NEGATIVE Final   Influenza B by PCR NEGATIVE NEGATIVE Final    Comment: (NOTE) The Xpert Xpress SARS-CoV-2/FLU/RSV assay is intended as an aid in  the diagnosis of influenza from Nasopharyngeal swab specimens and  should not be used as a sole basis for treatment. Nasal washings and  aspirates are unacceptable for Xpert Xpress SARS-CoV-2/FLU/RSV  testing. Fact Sheet for Patients: PinkCheek.be Fact Sheet for Healthcare Providers: GravelBags.it This test is not yet approved or cleared by the [place] FDA and  has been authorized for detection and/or diagnosis of SARS-CoV-2 by  FDA under an Emergency Use Authorization (EUA). This EUA will remain  in effect (meaning this test can be used) for the duration of the  Covid-19 declaration under Section 564(b)(1) of the Act, 21  U.S.C. section 360bbb-3(b)(1), unless the authorization is  terminated or revoked. Performed at [facility], [place] [address], [place], [place] [address]   Blood culture (routine x 2)     Status: None (Preliminary result)   Collection Time: 04/27/20  4:13 PM   Specimen: BLOOD  Result Value Ref Range Status   Specimen Description   Final    BLOOD RIGHT ARM Performed at [facility] [address], [place], [place] [address]    Special Requests   Final    BOTTLES DRAWN AEROBIC AND ANAEROBIC Blood Culture adequate  volume Performed at [facility] [address], [place], [place] [address]    Culture   Final    NO GROWTH 3 DAYS Performed at [facility], [place] [address], [place], [place] [address]    Report Status PENDING  Incomplete     Time coordinating discharge: 45 minutes  SIGNED:   Tawni Millers, MD  [facility] 04/30/2020, 9:00 AM

## 2020-05-02 LAB — CULTURE, BLOOD (ROUTINE X 2)
Culture: NO GROWTH
Culture: NO GROWTH
Special Requests: ADEQUATE
Special Requests: ADEQUATE

## 2020-05-12 DIAGNOSIS — Z9884 Bariatric surgery status: Secondary | ICD-10-CM | POA: Insufficient documentation

## 2020-05-20 DIAGNOSIS — K5900 Constipation, unspecified: Secondary | ICD-10-CM | POA: Insufficient documentation

## 2020-05-30 ENCOUNTER — Other Ambulatory Visit: Payer: Self-pay

## 2020-05-30 ENCOUNTER — Emergency Department (HOSPITAL_COMMUNITY)
Admission: EM | Admit: 2020-05-30 | Discharge: 2020-05-30 | Discharge status: Against Medical Advice | Payer: 59 | Attending: Emergency Medicine | Admitting: Emergency Medicine

## 2020-05-30 ENCOUNTER — Encounter (HOSPITAL_COMMUNITY): Payer: Self-pay | Admitting: Emergency Medicine

## 2020-05-30 DIAGNOSIS — E860 Dehydration: Secondary | ICD-10-CM | POA: Insufficient documentation

## 2020-05-30 DIAGNOSIS — Z5321 Procedure and treatment not carried out due to patient leaving prior to being seen by health care provider: Secondary | ICD-10-CM | POA: Insufficient documentation

## 2020-05-30 NOTE — ED Triage Notes (Signed)
Per pt, states she had gastric by pass on 5/26-states she hasn't been drinking fluids due to them making her nauseated-states she had labs done recently at [facility] and they informed her that she was dehydrated and to come to ED for fluids

## 2020-06-04 ENCOUNTER — Other Ambulatory Visit: Payer: Self-pay

## 2020-06-04 ENCOUNTER — Emergency Department (HOSPITAL_COMMUNITY)
Admission: EM | Admit: 2020-06-04 | Discharge: 2020-06-05 | Disposition: A | Discharge status: Home / Self Care | Payer: 59 | Attending: Emergency Medicine | Admitting: Emergency Medicine

## 2020-06-04 ENCOUNTER — Emergency Department (HOSPITAL_COMMUNITY): Payer: 59

## 2020-06-04 ENCOUNTER — Encounter (HOSPITAL_COMMUNITY): Payer: Self-pay | Admitting: Emergency Medicine

## 2020-06-04 DIAGNOSIS — Z7984 Long term (current) use of oral hypoglycemic drugs: Secondary | ICD-10-CM | POA: Insufficient documentation

## 2020-06-04 DIAGNOSIS — R2243 Localized swelling, mass and lump, lower limb, bilateral: Secondary | ICD-10-CM | POA: Insufficient documentation

## 2020-06-04 DIAGNOSIS — E1122 Type 2 diabetes mellitus with diabetic chronic kidney disease: Secondary | ICD-10-CM | POA: Insufficient documentation

## 2020-06-04 DIAGNOSIS — Z9884 Bariatric surgery status: Secondary | ICD-10-CM | POA: Insufficient documentation

## 2020-06-04 DIAGNOSIS — K219 Gastro-esophageal reflux disease without esophagitis: Secondary | ICD-10-CM | POA: Diagnosis not present

## 2020-06-04 DIAGNOSIS — N189 Chronic kidney disease, unspecified: Secondary | ICD-10-CM | POA: Insufficient documentation

## 2020-06-04 DIAGNOSIS — E860 Dehydration: Secondary | ICD-10-CM | POA: Insufficient documentation

## 2020-06-04 DIAGNOSIS — R002 Palpitations: Secondary | ICD-10-CM | POA: Insufficient documentation

## 2020-06-04 DIAGNOSIS — E876 Hypokalemia: Secondary | ICD-10-CM | POA: Diagnosis not present

## 2020-06-04 DIAGNOSIS — Z79899 Other long term (current) drug therapy: Secondary | ICD-10-CM | POA: Insufficient documentation

## 2020-06-04 DIAGNOSIS — I129 Hypertensive chronic kidney disease with stage 1 through stage 4 chronic kidney disease, or unspecified chronic kidney disease: Secondary | ICD-10-CM | POA: Insufficient documentation

## 2020-06-04 DIAGNOSIS — R0600 Dyspnea, unspecified: Secondary | ICD-10-CM | POA: Insufficient documentation

## 2020-06-04 DIAGNOSIS — M545 Low back pain: Secondary | ICD-10-CM | POA: Diagnosis not present

## 2020-06-04 DIAGNOSIS — R0789 Other chest pain: Secondary | ICD-10-CM | POA: Insufficient documentation

## 2020-06-04 LAB — URINALYSIS, ROUTINE W REFLEX MICROSCOPIC
Bacteria, UA: NONE SEEN
Bilirubin Urine: NEGATIVE
Glucose, UA: NEGATIVE mg/dL
Hgb urine dipstick: NEGATIVE
Ketones, ur: 5 mg/dL — AB
Leukocytes,Ua: NEGATIVE
Nitrite: NEGATIVE
Protein, ur: 30 mg/dL — AB
Specific Gravity, Urine: 1.03 (ref 1.005–1.030)
pH: 5 (ref 5.0–8.0)

## 2020-06-04 LAB — BASIC METABOLIC PANEL
Anion gap: 9 (ref 5–15)
BUN: 9 mg/dL (ref 6–20)
CO2: 27 mmol/L (ref 22–32)
Calcium: 9 mg/dL (ref 8.9–10.3)
Chloride: 107 mmol/L (ref 98–111)
Creatinine, Ser: 0.95 mg/dL (ref 0.44–1.00)
GFR calc Af Amer: >60 mL/min (ref >60)
GFR calc non Af Amer: >60 mL/min (ref >60)
Glucose, Bld: 109 mg/dL — ABNORMAL HIGH (ref 70–99)
Potassium: 3 mmol/L — ABNORMAL LOW (ref 3.5–5.1)
Sodium: 143 mmol/L (ref 135–145)

## 2020-06-04 LAB — CBC
HCT: 39.4 % (ref 36.0–46.0)
Hemoglobin: 12.5 g/dL (ref 12.0–15.0)
MCH: 27.8 pg (ref 26.0–34.0)
MCHC: 31.7 g/dL (ref 30.0–36.0)
MCV: 87.8 fL (ref 80.0–100.0)
Platelets: 185 10*3/uL (ref 150–400)
RBC: 4.49 MIL/uL (ref 3.87–5.11)
RDW: 14.4 % (ref 11.5–15.5)
WBC: 5.2 10*3/uL (ref 4.0–10.5)
nRBC: 0 % (ref 0.0–0.2)

## 2020-06-04 LAB — BRAIN NATRIURETIC PEPTIDE: B Natriuretic Peptide: 11 pg/mL (ref 0.0–100.0)

## 2020-06-04 LAB — I-STAT BETA HCG BLOOD, ED (NOT ORDERABLE): I-stat hCG, quantitative: <5 m[IU]/mL (ref <5)

## 2020-06-04 LAB — TROPONIN I (HIGH SENSITIVITY): Troponin I (High Sensitivity): <2 ng/L (ref <18)

## 2020-06-04 MED ORDER — ONDANSETRON HCL 4 MG/2ML IJ SOLN
4.0000 mg | Freq: Once | INTRAMUSCULAR | Status: AC
  Administered 2020-06-05: 4 mg via INTRAVENOUS
  Filled 2020-06-04: qty 2

## 2020-06-04 MED ORDER — KETOROLAC TROMETHAMINE 30 MG/ML IJ SOLN
30.0000 mg | Freq: Once | INTRAMUSCULAR | Status: AC
  Administered 2020-06-05: 30 mg via INTRAVENOUS
  Filled 2020-06-04: qty 1

## 2020-06-04 NOTE — ED Provider Notes (Addendum)
[facility] DEPT Provider Note   CSN: [id number] Arrival date & time: 06/04/20  1626     History Chief Complaint  Patient presents with   Chest Pain    Victoria Moore is a 44 y.o. female with a history of anemia, anxiety, low back pain, GERD, morbid obesity s/p Roux-en-Y gastric bypass surgery, seizures who presents to the emergency department with a chief complaint of chest pain.  The patient reports a sudden onset "sharp" anterior chest pain that radiates from her left to her right side and down her bilateral arms into all of her fingers.  Pain is not pleuritic and does not radiate to her back.  Pain has been constant since onset began while she was sitting down watching TV.  No known aggravating or alleviating factors.  No history of similar pain.  Pain does not seem to be worse with positional changes.  She attempted to take Pepcid at home for her symptoms with no improvement.  She also reports compliance with her home PPI after the surgery.  She reports associated palpitations and states that she "feels like she can feel her heartbeat in her throat", but does denies dysphagia or sore throat.  She did have some throat pain and left ear pain several days ago, but this has since resolved.   She also reports swelling in her bilateral ankles over the last few days, which she states was chronic prior to her surgery but had significantly improved until the last few days.  reports that she typically sleeps with "a lot of pillows" due to back pain and sleep apnea, but reports that she has increase this over the last few days.  She reports that she has been having bilateral back pain and pain in her buttocks since the surgery.  Pain has not changed since onset.  She does report that she has only been voiding approximately 2 times per day despite increasing her fluid intake. She has been feeling dizzy and lightheaded that has been coming and going since the surgery,  which is not worse today.  She denies shortness of breath, cough, vomiting, diarrhea, constipation   The patient underwent Roux-en-Y gastric bypass surgery on 4/26.  Last seen by bariatrics team on 5/25.  She had a negative PE study in the ER on 5/2.   The history is provided by the patient. No language interpreter was used.    HPI: A 44 year old patient with a history of treated diabetes, hypertension and obesity presents for evaluation of chest pain. Initial onset of pain was more than 6 hours ago. The patient's chest pain is sharp and is not worse with exertion. The patient's chest pain is not middle- or left-sided, is not well-localized, is not described as heaviness/pressure/tightness and does radiate to the arms/jaw/neck. The patient does not complain of nausea and denies diaphoresis. The patient has no history of stroke, has no history of peripheral artery disease, has not smoked in the past 90 days, has no relevant family history of coronary artery disease (first degree relative at less than age 58) and has no history of hypercholesterolemia.   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 Fibromyalgia, doubt sarcoid    Asthma  Blood transfusion without reported diagnosis    Chest wall pain 05/26/2018   Chronic kidney disease    Chronic low back pain with sciatica 09/15/2018   Common migraine with intractable migraine 10/07/2016   Cough variant asthma vs UACS  10/23/2016   - Spirometry 04/15/2016  Very truncated exp loop effort dep portion only  - Allergy profile 10/22/2016 >  Eos 0.2/  IgE  52 RAST POS grass/trees/ragweed  10/22/2016  After extensive coaching HFA effectiveness =    75% try duelra 100 2bid > improved   Daytime sleepiness 05/26/2018   Decreased pedal pulses 10/04/2018    Depression    Depression, major, single episode, moderate ([place]) 05/26/2018   Diabetes ([place])    Dyspnea 04/15/2016   04/15/2016  Walked RA x 3 laps @ 185 ft each stopped due to  End of study, nl pace, no sob or desat    - Spirometry 04/15/2016  Very truncated exp loop effort dep portion only  - 10/22/2016  Walked RA x 3 laps @ 185 ft each stopped due to  End of study, slow pace, min sob/ no desat - full pfts rec 10/22/2016 >>>       Edema    Essential hypertension, benign 11/15/2016   Fibroids 03/11/2016   Frequency of urination 09/11/2018   GERD (gastroesophageal reflux disease)    Herniated lumbar intervertebral disc    Hiatal hernia    Hilar adenopathy    Hypertension    Hypokalemia 06/28/2018   Impaired fasting blood sugar 09/11/2018   Iron deficiency    Leg pain 12/07/2018   Low libido 11/27/2018   Migraine    Mild sleep apnea 08/03/2018   HST 06/20/18  AHI  8.1 / snoring with 02 nadir 80% >  08/03/2018 rec sleep medicine consultation    Morbid (severe) obesity due to excess calories ([place]) 10/23/2016   Personal history of sarcoidosis 05/26/2018   Prolonged capillary refill time    Right leg swelling 08/18/2018   Sarcoidosis    personal history of   Seizures ([place])    history of    Sleep apnea    Spondylosis    Vitamin D deficiency     Patient Active Problem List   Diagnosis Date Noted   Intractable abdominal pain 04/27/2020   Other intervertebral disc degeneration, lumbar region 09/19/2019   Complex cyst of left ovary 02/01/2019   Sarcoidosis 02/01/2019   Leg pain 12/07/2018   Low libido 11/27/2018   Prolonged capillary refill time 10/04/2018   Decreased pedal pulses 10/04/2018   Spondylosis 09/15/2018   Chronic low back pain with sciatica 09/15/2018   Impaired fasting blood sugar 09/11/2018   Weight gain 09/11/2018   Mild sleep apnea 08/03/2018   Hypokalemia 06/28/2018   Medication side effect 06/22/2018   Acute low back pain  without sciatica 06/22/2018   Personal history of sarcoidosis 05/26/2018   Vitamin D deficiency 05/26/2018   Encounter for health maintenance examination with abnormal findings 05/26/2018   Depression, major, single episode, moderate ([place]) 05/26/2018   Daytime sleepiness 05/26/2018   Chest pain of uncertain etiology 76/16/0737   GERD (gastroesophageal reflux disease) 05/26/2018   Essential hypertension, benign 11/15/2016   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/10/2016   Morbid (severe) obesity due to excess calories ([place]) 10/23/2016   Common migraine with intractable migraine 10/07/2016   Dyspnea 04/15/2016   Hilar adenopathy 04/15/2016   Seizures ([place]) 03/11/2016   Anemia 11/12/2014   Iron deficiency 11/12/2014    Past  Surgical History:  Procedure Laterality Date   ABDOMINAL HYSTERECTOMY     ESOPHAGEAL MANOMETRY N/A 09/05/2019   Procedure: ESOPHAGEAL MANOMETRY (EM);  Surgeon: Lavena Bullion, DO;  Location: [facility];  Service: Gastroenterology;  Laterality: N/A;   LAPAROSCOPIC OVARIAN CYSTECTOMY Left 03/11/2016   Procedure: LAPAROSCOPIC OVARIAN CYSTECTOMY;  Surgeon: Eldred Manges, MD;  Location: [facility];  Service: Gynecology;  Laterality: Left;   LAPAROSCOPIC VAGINAL HYSTERECTOMY WITH SALPINGECTOMY Bilateral 03/11/2016   Procedure: LAPAROSCOPIC ASSISTED VAGINAL HYSTERECTOMY WITH SALPINGECTOMY;  Surgeon: Eldred Manges, MD;  Location: [facility];  Service: Gynecology;  Laterality: Bilateral;   [place] IMPEDANCE STUDY  09/05/2019   Procedure: [place] IMPEDANCE STUDY;  Surgeon: Lavena Bullion, DO;  Location: [facility];  Service: Gastroenterology;;   TUBAL LIGATION     UPPER GASTROINTESTINAL ENDOSCOPY  10/11/2019   06/2019     OB History    Gravida  1   Para      Term      Preterm      AB      Living        SAB      TAB      Ectopic      Multiple      Live Births              Family History  Adopted: Yes  Problem Relation Age of Onset    Other Son        Growing pains   Asthma Mother    Heart Problems Mother    Migraines Mother    COPD Mother    Heart attack Mother    Diabetes Father    Peptic Ulcer Father    Heart Problems Father    COPD Father    Heart attack Father    Colon cancer Neg Hx    Colon polyps Neg Hx    Esophageal cancer Neg Hx    Rectal cancer Neg Hx    Stomach cancer Neg Hx     Social History   Tobacco Use   Smoking status: Former Smoker    Packs/day: 0.25    Years: 17.00    Pack years: 4.25    Types: Cigarettes    Quit date: 03/02/2013    Years since quitting: 7.2   Smokeless tobacco: Never Used  Vaping Use   Vaping Use: Never used  Substance Use Topics   Alcohol use: No    Alcohol/week: 0.0 standard drinks   Drug use: Not Currently    Types: Marijuana    Comment: former - 6 yrs ago    Home Medications Prior to Admission medications   Medication Sig Start Date End Date Taking? Authorizing Provider  ACETAMINOPHEN EXTRA STRENGTH 500 MG tablet Take 1,000 mg by mouth every 6 (six) hours as needed for pain. 04/08/20   [provider]  acetaZOLAMIDE (DIAMOX) 500 MG capsule Take 2 capsules (1,000 mg total) by mouth 2 (two) times daily. 01/28/20   Kathrynn Ducking, MD  albuterol (PROVENTIL) (2.5 MG/3ML) 0.083% nebulizer solution Take 2.5 mg by nebulization every 6 (six) hours as needed for wheezing or shortness of breath.    [provider]  albuterol (VENTOLIN HFA) 108 (90 Base) MCG/ACT inhaler Inhale 1-2 puffs into the lungs every 4 (four) hours as needed for wheezing or shortness of breath. 01/14/20   Parrett, Fonnie Mu, NP  ALPRAZolam Duanne Moron) 1 MG tablet Take 0.5-1 tablets (0.5-1 mg total) by mouth at bedtime as needed for  anxiety. 12/05/19   Ngetich, Dinah C, NP  amphetamine-dextroamphetamine (ADDERALL XR) 20 MG 24 hr capsule Take 20 mg by mouth in the morning. 04/08/20   [provider]  chlorthalidone (HYGROTON) 25 MG tablet TAKE 1 TABLET BY MOUTH  EVERY DAY Patient taking differently: Take 25 mg by mouth daily.  11/05/19   Ngetich, Dinah C, NP  citalopram (CELEXA) 20 MG tablet TAKE 1 TABLET BY MOUTH EVERY DAY Patient taking differently: Take 20 mg by mouth daily.  01/14/20   Ngetich, Dinah C, NP  ferrous sulfate 325 (65 FE) MG tablet Take 325 mg by mouth daily with breakfast.    [provider]  Fluticasone-Salmeterol (ADVAIR DISKUS) 100-50 MCG/DOSE AEPB Inhale 1 puff into the lungs 2 (two) times daily. 11/20/19   Parrett, Fonnie Mu, NP  gabapentin (NEURONTIN) 300 MG capsule Take 300 mg by mouth 3 (three) times daily.  04/08/20   [provider]  HYDROcodone-acetaminophen (NORCO/VICODIN) 5-325 MG tablet Take 1 tablet by mouth every 6 (six) hours as needed. 06/05/20   Ellenora Talton A, PA-C  hyoscyamine (LEVSIN SL) 0.125 MG SL tablet Place 1 tablet (0.125 mg total) under the tongue every 6 (six) hours as needed. Patient taking differently: Place 0.125 mg under the tongue every 6 (six) hours as needed for cramping.  11/05/19   Cirigliano, Vito V, DO  metFORMIN (GLUCOPHAGE) 500 MG tablet Take 500 mg by mouth daily.     [provider]  methocarbamol (ROBAXIN) 750 MG tablet Take 750 mg by mouth 3 (three) times daily. 04/24/20   [provider]  mometasone-formoterol (DULERA) 100-5 MCG/ACT AERO Inhale 2 puffs into the lungs 2 (two) times daily. 08/10/19   Ngetich, Dinah C, NP  nortriptyline (PAMELOR) 25 MG capsule Take 2 capsules at bedtime. Patient taking differently: Take 50 mg by mouth at bedtime.  12/17/19   Suzzanne Cloud, NP  ondansetron (ZOFRAN-ODT) 4 MG disintegrating tablet Take 4 mg by mouth every 8 (eight) hours as needed for nausea/vomiting. 04/08/20   [provider]  OVER THE COUNTER MEDICATION Take 1 tablet by mouth daily. Bariatric vitamins    [provider]  oxyCODONE (OXY IR/ROXICODONE) 5 MG immediate release tablet Take 5 mg by mouth 2 (two) times daily as needed for pain. 04/24/20    [provider]  pantoprazole (PROTONIX) 40 MG tablet Take 40 mg by mouth daily. 04/08/20   [provider]  potassium chloride SA (KLOR-CON) 20 MEQ tablet Take 1 tablet (20 mEq total) by mouth 2 (two) times daily for 5 days. 06/05/20 06/10/20  Chanel Mcadams A, PA-C  scopolamine (TRANSDERM-SCOP) 1 MG/3DAYS Place 1 patch onto the skin every 3 (three) days. 04/08/20   [provider]  spironolactone (ALDACTONE) 25 MG tablet TAKE 1 TABLET BY MOUTH EVERY DAY Patient taking differently: Take 25 mg by mouth daily.  11/05/19   Ngetich, Dinah C, NP  SUMAtriptan (IMITREX) 100 MG tablet TAKE 1 TABLET BY MOUTH 2X DAILY AS NEEDED FOR MIGRAINE. MAY REPEAT IN 2 HOURS IF HEADACHE PERSISTS OR RECURS. Patient taking differently: Take 100 mg by mouth every 2 (two) hours as needed for migraine. May repeat in 2 hours if headache persists or reoccurs 12/11/19   Kathrynn Ducking, MD  testosterone cypionate (DEPOTESTOSTERONE CYPIONATE) 200 MG/ML injection Inject 200 mg into the muscle every 30 (thirty) days. 03/28/20   [provider]  traMADol (ULTRAM) 50 MG tablet Take 50 mg by mouth every 6 (six) hours as needed. 03/27/20  [provider]    Allergies    Aspirin  Review of Systems   Review of Systems  Constitutional: Positive for fatigue. Negative for activity change, chills, diaphoresis and fever.  HENT: Negative for sore throat.   Respiratory: Negative for cough, shortness of breath and wheezing.   Cardiovascular: Positive for chest pain, palpitations and leg swelling.  Gastrointestinal: Positive for nausea. Negative for abdominal pain, blood in stool, constipation and vomiting.  Genitourinary: Positive for decreased urine volume. Negative for dysuria, enuresis, flank pain, frequency, urgency, vaginal bleeding, vaginal discharge and vaginal pain.  Musculoskeletal: Positive for back pain. Negative for arthralgias, gait problem, myalgias, neck pain and neck stiffness.  Skin:  Negative for rash.  Allergic/Immunologic: Negative for immunocompromised state.  Neurological: Positive for light-headedness. Negative for dizziness, syncope, weakness, numbness and headaches.  Psychiatric/Behavioral: Negative for confusion.    Physical Exam Updated Vital Signs BP (!) 123/54    Pulse 67    Temp 98.5 F (36.9 C) (Oral)    Resp 11    LMP 02/19/2016    SpO2 92%   Physical Exam Vitals and nursing note reviewed.  Constitutional:      General: She is not in acute distress.    Appearance: She is obese. She is not ill-appearing, toxic-appearing or diaphoretic.  HENT:     Head: Normocephalic.     Mouth/Throat:     Pharynx: No oropharyngeal exudate or posterior oropharyngeal erythema.  Eyes:     Conjunctiva/sclera: Conjunctivae normal.  Cardiovascular:     Rate and Rhythm: Normal rate and regular rhythm.     Heart sounds: No murmur. No friction rub. No gallop.   Pulmonary:     Effort: Pulmonary effort is normal. No respiratory distress.     Breath sounds: No stridor. No wheezing, rhonchi or rales.     Comments: Reproducible pain to the right anterior chest wall.  There is no crepitus or step-offs.  No rashes.  No pain to the left anterior chest wall. Chest:     Chest wall: Tenderness present.  Abdominal:     General: There is no distension.     Palpations: Abdomen is soft. There is no mass.     Tenderness: There is abdominal tenderness. There is no right CVA tenderness, left CVA tenderness, guarding or rebound.     Hernia: No hernia is present.     Comments: Diffuse pain to the bilateral low back.  There is also tenderness over the bilateral CVA regions.  Musculoskeletal:     Cervical back: Neck supple.     Comments: 1+ pitting symmetric edema noted to the bilateral ankles.  Calves are nontender to palpation.  Skin:    General: Skin is warm.     Capillary Refill: Capillary refill takes less than 2 seconds.     Findings: No rash.  Neurological:     Mental Status:  She is alert.  Psychiatric:        Behavior: Behavior normal.     ED Results / Procedures / Treatments   Labs (all labs ordered are listed, but only abnormal results are displayed) Labs Reviewed  BASIC METABOLIC PANEL - Abnormal; Notable for the following components:      Result Value   Potassium 3.0 (*)    Glucose, Bld 109 (*)    All other components within normal limits  URINALYSIS, ROUTINE W REFLEX MICROSCOPIC - Abnormal; Notable for the following components:   Color, Urine AMBER (*)    APPearance CLOUDY (*)  Ketones, ur 5 (*)    Protein, ur 30 (*)    All other components within normal limits  CBC  BRAIN NATRIURETIC PEPTIDE  I-STAT BETA HCG BLOOD, ED ([facility], [facility], [facility] ONLY)  I-STAT BETA HCG BLOOD, ED (NOT ORDERABLE)  TROPONIN I (HIGH SENSITIVITY)  TROPONIN I (HIGH SENSITIVITY)    EKG EKG Interpretation  Date/Time:  Wednesday June 04 2020 22:50:23 EDT Ventricular Rate:  56 PR Interval:    QRS Duration: 102 QT Interval:  432 QTC Calculation: 417 R Axis:   67 Text Interpretation: Sinus rhythm Low voltage, precordial leads Borderline T abnormalities, anterior leads Baseline wander in lead(s) V3 No significant change since last tracing Confirmed by Pryor Curia [phone]) on 06/05/2020 3:53:22 AM   Radiology DG Chest 2 View  Result Date: 06/04/2020 CLINICAL DATA:  Chest pain. EXAM: CHEST - 2 VIEW COMPARISON:  September 14, 2019 FINDINGS: The heart size and mediastinal contours are within normal limits. Both lungs are clear. The visualized skeletal structures are unremarkable. IMPRESSION: No active cardiopulmonary disease. Electronically Signed   By: Virgina Norfolk M.D.   On: 06/04/2020 17:38    Procedures Procedures (including critical care time)  Medications Ordered in ED Medications  ondansetron (ZOFRAN) injection 4 mg (4 mg Intravenous Given 06/05/20 0031)  ketorolac (TORADOL) 30 MG/ML injection 30 mg (30 mg Intravenous Given 06/05/20 0030)  sodium chloride 0.9 %  bolus 1,000 mL (0 mLs Intravenous Stopped 06/05/20 0244)  HYDROmorphone (DILAUDID) 1 MG/ML injection (  Given 06/05/20 0136)    ED Course  I have reviewed the triage vital signs and the nursing notes.  Pertinent labs & imaging results that were available during my care of the patient were reviewed by me and considered in my medical decision making (see chart for details).    MDM Rules/Calculators/A&P HEAR Score: 80                    44 year old female with a history of anemia, anxiety, low back pain, GERD, morbid obesity s/p Roux-en-Y gastric bypass surgery, seizures who presents to the emergency department with a chief complaint of chest pain.  Pain is sharp, bilateral, and radiates down both arms into the bilateral fingers.  No shortness of breath, cough, or constitutional symptoms.  She also reports she has been having intermittent lightheadedness since her surgery.  Vital signs are normal in the ER.  She has reproducible pain to the right chest wall on exam.  Patient's medical record has been extensively reviewed.  She underwent Roux-en-Y surgery on 4/26.  She was seen in the ER on 5/2 she had a negative PE study and was admitted for dehydration, intractable pain, and intractable nausea and vomiting.  She reports that she has been having chronic bilateral low back pain since the procedure.  Unchanged today.  No GI or GU complaints.  Work-up today is reassuring.  She has mild hypokalemia and will discharge home with oral potassium chloride.  No leukocytosis.  EKG with sinus rhythm.  No evidence of ischemia.  Chest x-ray has been reviewed by me and is unremarkable.  Troponin x2 are negative. HEAR score is 3.  BNP is normal and patient does not appear hypervolemic.  Doubt acute congestive heart failure.  Doubt ACS, aortic dissection, esophageal rupture, or tension pneumothorax.  Strongly considered PE given history of surgery approximately a month and a half ago, but patient has already had a  negative PE study.  After reviewing the patient's medical record, it appears  that she has been seen previously in the ER multiple times with chest pain similar to her complaints today, which makes PE much less likely.  The patient was discussed with Dr. Leonides Schanz, attending physician.   On reevaluation, patient was given IV fluids and pain medication.  Pain had resolved.  She is feeling much better.  She had negative orthostatic vital signs.  Urine did not appear infectious.  Labs are otherwise reassuring.  She was ambulated through the department and was asymptomatic after receiving IV fluids.  She is hemodynamically stable in no acute distress.  ER return precautions given.  Safe for discharge home with close outpatient follow-up.  Final Clinical Impression(s) / ED Diagnoses Final diagnoses:  Atypical chest pain  Dehydration  Hypokalemia    Rx / DC Orders ED Discharge Orders         Ordered    HYDROcodone-acetaminophen (NORCO/VICODIN) 5-325 MG tablet  Every 6 hours PRN     Discontinue  Reprint     06/05/20 0241    potassium chloride SA (KLOR-CON) 20 MEQ tablet  2 times daily     Discontinue  Reprint     06/05/20 0253           Shown Dissinger A, PA-C 06/05/20 0259    Teletha Petrea A, PA-C 06/05/20 0355    Ward, Delice Bison, DO 06/05/20 0404

## 2020-06-04 NOTE — ED Triage Notes (Signed)
Patient c/o chest pain radiating from right chest to left and down arm with nausea. Reports gastric bypass x1 month ago with continued abdominal pain.

## 2020-06-05 LAB — TROPONIN I (HIGH SENSITIVITY): Troponin I (High Sensitivity): <2 ng/L (ref <18)

## 2020-06-05 MED ORDER — POTASSIUM CHLORIDE CRYS ER 20 MEQ PO TBCR: 20.0000 meq | EXTENDED_RELEASE_TABLET | Freq: Two times a day (BID) | ORAL | 0 refills | Status: DC

## 2020-06-05 MED ORDER — SODIUM CHLORIDE 0.9 % IV BOLUS
1000.0000 mL | Freq: Once | INTRAVENOUS | Status: AC
  Administered 2020-06-05: 1000 mL via INTRAVENOUS

## 2020-06-05 MED ORDER — HYDROMORPHONE HCL 1 MG/ML IJ SOLN
INTRAMUSCULAR | Status: AC
  Filled 2020-06-05: qty 1

## 2020-06-05 MED ORDER — HYDROCODONE-ACETAMINOPHEN 5-325 MG PO TABS: 1.0000 | ORAL_TABLET | Freq: Four times a day (QID) | ORAL | 0 refills | Status: DC | PRN

## 2020-06-05 NOTE — ED Notes (Signed)
Pt given a [place] sandwich and some water. Pt able to eat without difficulty. Pt able to ambulate without feeling dizzy or lightheaded.

## 2020-06-05 NOTE — Discharge Instructions (Signed)
Thank you for allowing me to care for you today in the Emergency Department.   You can take 650 mg of Tylenol once every 6 hours for pain.  For severe, uncontrollable pain, you can take 1 tablet of Norco every 6 hours.  Do not work or drive while taking this medication.  Do not take more than 4000 mg of Tylenol in a 24-hour period.  Take 1 tablet of potassium chloride 2 times daily for the next 5 days.  If you continue to have pain related to your surgery, please follow-up with the bariatric surgery team.  You should also follow closely with primary care.  Return to the emergency department if you develop respiratory distress, chest pain with high fevers, uncontrollable vomiting, or other new, concerning symptoms.

## 2020-06-11 ENCOUNTER — Emergency Department (HOSPITAL_COMMUNITY): Payer: 59

## 2020-06-11 ENCOUNTER — Emergency Department (HOSPITAL_COMMUNITY)
Admission: EM | Admit: 2020-06-11 | Discharge: 2020-06-12 | Disposition: A | Discharge status: Home / Self Care | Payer: 59 | Attending: Emergency Medicine | Admitting: Emergency Medicine

## 2020-06-11 ENCOUNTER — Other Ambulatory Visit: Payer: Self-pay

## 2020-06-11 ENCOUNTER — Encounter (HOSPITAL_COMMUNITY): Payer: Self-pay

## 2020-06-11 DIAGNOSIS — F1210 Cannabis abuse, uncomplicated: Secondary | ICD-10-CM | POA: Insufficient documentation

## 2020-06-11 DIAGNOSIS — J45909 Unspecified asthma, uncomplicated: Secondary | ICD-10-CM | POA: Diagnosis not present

## 2020-06-11 DIAGNOSIS — Z87891 Personal history of nicotine dependence: Secondary | ICD-10-CM | POA: Diagnosis not present

## 2020-06-11 DIAGNOSIS — I129 Hypertensive chronic kidney disease with stage 1 through stage 4 chronic kidney disease, or unspecified chronic kidney disease: Secondary | ICD-10-CM | POA: Insufficient documentation

## 2020-06-11 DIAGNOSIS — R319 Hematuria, unspecified: Secondary | ICD-10-CM | POA: Insufficient documentation

## 2020-06-11 DIAGNOSIS — E119 Type 2 diabetes mellitus without complications: Secondary | ICD-10-CM | POA: Insufficient documentation

## 2020-06-11 DIAGNOSIS — N3001 Acute cystitis with hematuria: Secondary | ICD-10-CM

## 2020-06-11 DIAGNOSIS — N189 Chronic kidney disease, unspecified: Secondary | ICD-10-CM | POA: Insufficient documentation

## 2020-06-11 DIAGNOSIS — Z7984 Long term (current) use of oral hypoglycemic drugs: Secondary | ICD-10-CM | POA: Diagnosis not present

## 2020-06-11 LAB — URINALYSIS, ROUTINE W REFLEX MICROSCOPIC
Glucose, UA: NEGATIVE mg/dL
Ketones, ur: 5 mg/dL — AB
Nitrite: NEGATIVE
Protein, ur: 100 mg/dL — AB
RBC / HPF: >50 RBC/hpf — ABNORMAL HIGH (ref 0–5)
Specific Gravity, Urine: 1.03 (ref 1.005–1.030)
pH: 5 (ref 5.0–8.0)

## 2020-06-11 MED ORDER — KETOROLAC TROMETHAMINE 30 MG/ML IJ SOLN
30.0000 mg | Freq: Once | INTRAMUSCULAR | Status: AC
  Administered 2020-06-11: 30 mg via INTRAVENOUS
  Filled 2020-06-11: qty 1

## 2020-06-11 NOTE — ED Provider Notes (Signed)
[facility] DEPT Provider Note   CSN: [id number] Arrival date & time: 06/11/20  2049     History Chief Complaint  Patient presents with  . Hematuria    Victoria Moore is a 44 y.o. female.  The history is provided by the patient.  Hematuria This is a new problem. The current episode started 3 to 5 hours ago. The problem occurs constantly. The problem has not changed since onset.Pertinent negatives include no chest pain, no abdominal pain, no headaches and no shortness of breath. Nothing aggravates the symptoms. Nothing relieves the symptoms. She has tried nothing for the symptoms. The treatment provided no relief.  Flank Pain This is a new problem. The current episode started 3 to 5 hours ago. The problem occurs constantly. The problem has not changed since onset.Pertinent negatives include no chest pain, no abdominal pain, no headaches and no shortness of breath. Nothing aggravates the symptoms. Nothing relieves the symptoms. She has tried nothing for the symptoms. The treatment provided no relief.  the pain started several hours ago.  She has not taken anything for it.  No f/c/r.  No n/v/d.       Past Medical History:  Diagnosis Date  . Acute low back pain without sciatica 06/22/2018  . ADHD   . Allergy   . Anemia   . Anxiety   . Arthritis   . 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 Fibromyalgia, doubt sarcoid   . Asthma   . Blood transfusion without reported diagnosis   . Chest wall pain 05/26/2018  . Chronic kidney disease   . Chronic low back pain with sciatica 09/15/2018  . Common migraine with intractable migraine 10/07/2016  . Cough variant asthma vs UACS  10/23/2016   - Spirometry 04/15/2016  Very truncated exp loop effort dep portion only  - Allergy profile 10/22/2016 >  Eos 0.2/  IgE  52 RAST POS grass/trees/ragweed  10/22/2016  After extensive coaching HFA effectiveness =    75% try duelra  100 2bid > improved  . Daytime sleepiness 05/26/2018  . Decreased pedal pulses 10/04/2018  . Depression   . Depression, major, single episode, moderate ([place]) 05/26/2018  . Diabetes ([place])   . Dyspnea 04/15/2016   04/15/2016  Walked RA x 3 laps @ 185 ft each stopped due to  End of study, nl pace, no sob or desat    - Spirometry 04/15/2016  Very truncated exp loop effort dep portion only  - 10/22/2016  Walked RA x 3 laps @ 185 ft each stopped due to  End of study, slow pace, min sob/ no desat - full pfts rec 10/22/2016 >>>      . Edema   . Essential hypertension, benign 11/15/2016  . Fibroids 03/11/2016  . Frequency of urination 09/11/2018  . GERD (gastroesophageal reflux disease)   . Herniated lumbar intervertebral disc   . Hiatal hernia   . Hilar adenopathy   . Hypertension   . Hypokalemia 06/28/2018  . Impaired fasting blood sugar 09/11/2018  . Iron deficiency   . Leg pain 12/07/2018  . Low libido 11/27/2018  . Migraine   . Mild sleep apnea 08/03/2018   HST 06/20/18  AHI  8.1 / snoring with 02 nadir 80% >  08/03/2018 rec sleep medicine consultation   . Morbid (severe) obesity due to excess calories ([place]) 10/23/2016  . Personal history of sarcoidosis 05/26/2018  . Prolonged capillary refill  time   . Right leg swelling 08/18/2018  . Sarcoidosis    personal history of  . Seizures ([place])    history of   . Sleep apnea   . Spondylosis   . Vitamin D deficiency     Patient Active Problem List   Diagnosis Date Noted  . Intractable abdominal pain 04/27/2020  . Other intervertebral disc degeneration, lumbar region 09/19/2019  . Complex cyst of left ovary 02/01/2019  . Sarcoidosis 02/01/2019  . Leg pain 12/07/2018  . Low libido 11/27/2018  . Prolonged capillary refill time 10/04/2018  . Decreased pedal pulses 10/04/2018  . Spondylosis 09/15/2018  . Chronic low back pain with sciatica 09/15/2018  . Impaired fasting blood sugar 09/11/2018  . Weight gain 09/11/2018  . Mild sleep apnea 08/03/2018  .  Hypokalemia 06/28/2018  . Medication side effect 06/22/2018  . Acute low back pain without sciatica 06/22/2018  . Personal history of sarcoidosis 05/26/2018  . Vitamin D deficiency 05/26/2018  . Encounter for health maintenance examination with abnormal findings 05/26/2018  . Depression, major, single episode, moderate ([place]) 05/26/2018  . Daytime sleepiness 05/26/2018  . Chest pain of uncertain etiology 24/23/5361  . GERD (gastroesophageal reflux disease) 05/26/2018  . Essential hypertension, benign 11/15/2016  . 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 Morbid (severe) obesity due to excess calories ([place]) 10/23/2016  . Common migraine with intractable migraine 10/07/2016  . Dyspnea 04/15/2016  . Hilar adenopathy 04/15/2016  . Seizures ([place]) 03/11/2016  . Anemia 11/12/2014  . Iron deficiency 11/12/2014    Past Surgical History:  Procedure Laterality Date  . ABDOMINAL HYSTERECTOMY    . ESOPHAGEAL MANOMETRY N/A 09/05/2019   Procedure: ESOPHAGEAL MANOMETRY (EM);  Surgeon: Lavena Bullion, DO;  Location: [facility];  Service: Gastroenterology;  Laterality: N/A;  . LAPAROSCOPIC OVARIAN CYSTECTOMY Left 03/11/2016   Procedure: LAPAROSCOPIC OVARIAN CYSTECTOMY;  Surgeon: Eldred Manges, MD;  Location: [facility];  Service: Gynecology;  Laterality: Left;  . LAPAROSCOPIC VAGINAL HYSTERECTOMY WITH SALPINGECTOMY Bilateral 03/11/2016   Procedure: LAPAROSCOPIC ASSISTED VAGINAL HYSTERECTOMY WITH SALPINGECTOMY;  Surgeon: Eldred Manges, MD;  Location: [facility];  Service: Gynecology;  Laterality: Bilateral;  . [place] IMPEDANCE STUDY  09/05/2019   Procedure: [place] IMPEDANCE STUDY;  Surgeon: Lavena Bullion, DO;  Location: [facility];  Service: Gastroenterology;;  . TUBAL LIGATION    . UPPER GASTROINTESTINAL ENDOSCOPY  10/11/2019   06/2019     OB History    Gravida  1   Para      Term      Preterm      AB      Living        SAB      TAB      Ectopic      Multiple      Live Births                Family History  Adopted: Yes  Problem Relation Age of Onset  . Other Son        Growing pains  . Asthma Mother   . Heart Problems Mother   . Migraines Mother   . COPD Mother   . Heart attack Mother   . Diabetes Father   . Peptic Ulcer Father   . Heart Problems Father   . COPD Father   . Heart attack Father   . Colon cancer Neg Hx   . Colon polyps Neg Hx   . Esophageal cancer Neg Hx   .  Rectal cancer Neg Hx   . Stomach cancer Neg Hx     Social History   Tobacco Use  . Smoking status: Former Smoker    Packs/day: 0.25    Years: 17.00    Pack years: 4.25    Types: Cigarettes    Quit date: 03/02/2013    Years since quitting: 7.2  . Smokeless tobacco: Never Used  Vaping Use  . Vaping Use: Never used  Substance Use Topics  . Alcohol use: No    Alcohol/week: 0.0 standard drinks  . Drug use: Not Currently    Types: Marijuana    Comment: former - 6 yrs ago    Home Medications Prior to Admission medications   Medication Sig Start Date End Date Taking? Authorizing Provider  ACETAMINOPHEN EXTRA STRENGTH 500 MG tablet Take 1,000 mg by mouth every 6 (six) hours as needed for pain. 04/08/20   [provider]  acetaZOLAMIDE (DIAMOX) 500 MG capsule Take 2 capsules (1,000 mg total) by mouth 2 (two) times daily. 01/28/20   Kathrynn Ducking, MD  albuterol (PROVENTIL) (2.5 MG/3ML) 0.083% nebulizer solution Take 2.5 mg by nebulization every 6 (six) hours as needed for wheezing or shortness of breath.    [provider]  albuterol (VENTOLIN HFA) 108 (90 Base) MCG/ACT inhaler Inhale 1-2 puffs into the lungs every 4 (four) hours as needed for wheezing or shortness of breath. 01/14/20   Parrett, Fonnie Mu, NP  ALPRAZolam Duanne Moron) 1 MG tablet Take 0.5-1 tablets (0.5-1 mg total) by mouth at bedtime as needed for anxiety. 12/05/19   Ngetich, Dinah C, NP  amphetamine-dextroamphetamine (ADDERALL XR) 20 MG 24 hr capsule Take 20 mg by mouth in the morning. 04/08/20    [provider]  chlorthalidone (HYGROTON) 25 MG tablet TAKE 1 TABLET BY MOUTH EVERY DAY Patient taking differently: Take 25 mg by mouth daily.  11/05/19   Ngetich, Dinah C, NP  citalopram (CELEXA) 20 MG tablet TAKE 1 TABLET BY MOUTH EVERY DAY Patient taking differently: Take 20 mg by mouth daily.  01/14/20   Ngetich, Dinah C, NP  ferrous sulfate 325 (65 FE) MG tablet Take 325 mg by mouth daily with breakfast.    [provider]  Fluticasone-Salmeterol (ADVAIR DISKUS) 100-50 MCG/DOSE AEPB Inhale 1 puff into the lungs 2 (two) times daily. 11/20/19   Parrett, Fonnie Mu, NP  gabapentin (NEURONTIN) 300 MG capsule Take 300 mg by mouth 3 (three) times daily.  04/08/20   [provider]  HYDROcodone-acetaminophen (NORCO/VICODIN) 5-325 MG tablet Take 1 tablet by mouth every 6 (six) hours as needed. 06/05/20   McDonald, Mia A, PA-C  hyoscyamine (LEVSIN SL) 0.125 MG SL tablet Place 1 tablet (0.125 mg total) under the tongue every 6 (six) hours as needed. Patient taking differently: Place 0.125 mg under the tongue every 6 (six) hours as needed for cramping.  11/05/19   Cirigliano, Vito V, DO  metFORMIN (GLUCOPHAGE) 500 MG tablet Take 500 mg by mouth daily.     [provider]  methocarbamol (ROBAXIN) 750 MG tablet Take 750 mg by mouth 3 (three) times daily. 04/24/20   [provider]  mometasone-formoterol (DULERA) 100-5 MCG/ACT AERO Inhale 2 puffs into the lungs 2 (two) times daily. 08/10/19   Ngetich, Dinah C, NP  nortriptyline (PAMELOR) 25 MG capsule Take 2 capsules at bedtime. Patient taking differently: Take 50 mg by mouth at bedtime.  12/17/19   Suzzanne Cloud, NP  ondansetron (ZOFRAN-ODT) 4 MG disintegrating tablet Take 4  mg by mouth every 8 (eight) hours as needed for nausea/vomiting. 04/08/20   [provider]  OVER THE COUNTER MEDICATION Take 1 tablet by mouth daily. Bariatric vitamins    [provider]  oxyCODONE (OXY IR/ROXICODONE) 5 MG  immediate release tablet Take 5 mg by mouth 2 (two) times daily as needed for pain. 04/24/20   [provider]  pantoprazole (PROTONIX) 40 MG tablet Take 40 mg by mouth daily. 04/08/20   [provider]  potassium chloride SA (KLOR-CON) 20 MEQ tablet Take 1 tablet (20 mEq total) by mouth 2 (two) times daily for 5 days. 06/05/20 06/10/20  McDonald, Mia A, PA-C  scopolamine (TRANSDERM-SCOP) 1 MG/3DAYS Place 1 patch onto the skin every 3 (three) days. 04/08/20   [provider]  spironolactone (ALDACTONE) 25 MG tablet TAKE 1 TABLET BY MOUTH EVERY DAY Patient taking differently: Take 25 mg by mouth daily.  11/05/19   Ngetich, Dinah C, NP  SUMAtriptan (IMITREX) 100 MG tablet TAKE 1 TABLET BY MOUTH 2X DAILY AS NEEDED FOR MIGRAINE. MAY REPEAT IN 2 HOURS IF HEADACHE PERSISTS OR RECURS. Patient taking differently: Take 100 mg by mouth every 2 (two) hours as needed for migraine. May repeat in 2 hours if headache persists or reoccurs 12/11/19   Kathrynn Ducking, MD  testosterone cypionate (DEPOTESTOSTERONE CYPIONATE) 200 MG/ML injection Inject 200 mg into the muscle every 30 (thirty) days. 03/28/20   [provider]  traMADol (ULTRAM) 50 MG tablet Take 50 mg by mouth every 6 (six) hours as needed. 03/27/20   [provider]    Allergies    Aspirin  Review of Systems   Review of Systems  Constitutional: Negative for fever.  HENT: Negative for congestion.   Eyes: Negative for visual disturbance.  Respiratory: Negative for shortness of breath.   Cardiovascular: Negative for chest pain.  Gastrointestinal: Negative for abdominal pain.  Genitourinary: Positive for flank pain and hematuria.  Musculoskeletal: Negative for arthralgias.  Skin: Negative for wound.  Neurological: Negative for headaches.  Psychiatric/Behavioral: Negative for agitation.  All other systems reviewed and are negative.   Physical Exam Updated Vital Signs BP 122/86 (BP Location: Left Arm)    Pulse 65   Temp 98.6 F (37 C) (Oral)   Resp 19   Ht 5\' 10"  (1.778 m)   Wt 126.1 kg   LMP 02/19/2016   SpO2 99%   BMI 39.89 kg/m   Physical Exam Vitals and nursing note reviewed.  Constitutional:      General: She is not in acute distress.    Appearance: Normal appearance.  HENT:     Head: Normocephalic and atraumatic.     Nose: Nose normal.  Eyes:     Conjunctiva/sclera: Conjunctivae normal.     Pupils: Pupils are equal, round, and reactive to light.  Cardiovascular:     Rate and Rhythm: Normal rate and regular rhythm.     Pulses: Normal pulses.     Heart sounds: Normal heart sounds.  Pulmonary:     Effort: Pulmonary effort is normal.     Breath sounds: Normal breath sounds.  Abdominal:     General: Abdomen is flat. Bowel sounds are normal.     Tenderness: There is no abdominal tenderness. There is no guarding or rebound.  Musculoskeletal:        General: Normal range of motion.     Cervical back: Normal range of motion and neck supple.  Skin:    General: Skin is warm  and dry.     Capillary Refill: Capillary refill takes less than 2 seconds.  Neurological:     General: No focal deficit present.     Mental Status: She is alert and oriented to person, place, and time.     Deep Tendon Reflexes: Reflexes normal.  Psychiatric:        Mood and Affect: Mood normal.        Behavior: Behavior normal.     ED Results / Procedures / Treatments   Labs (all labs ordered are listed, but only abnormal results are displayed) Results for orders placed or performed during the hospital encounter of 06/11/20  Urinalysis, Routine w reflex microscopic- may I&O cath if menses  Result Value Ref Range   Color, Urine YELLOW YELLOW   APPearance CLOUDY (A) CLEAR   Specific Gravity, Urine 1.030 1.005 - 1.030   pH 5.0 5.0 - 8.0   Glucose, UA NEGATIVE NEGATIVE mg/dL   Hgb urine dipstick LARGE (A) NEGATIVE   Bilirubin Urine SMALL (A) NEGATIVE   Ketones, ur 5 (A) NEGATIVE mg/dL    Protein, ur 100 (A) NEGATIVE mg/dL   Nitrite NEGATIVE NEGATIVE   Leukocytes,Ua LARGE (A) NEGATIVE   RBC / HPF >50 (H) 0 - 5 RBC/hpf   WBC, UA 6-10 0 - 5 WBC/hpf   Bacteria, UA RARE (A) NONE SEEN   Squamous Epithelial / LPF 11-20 0 - 5   Mucus PRESENT    Hyaline Casts, UA PRESENT    DG Chest 2 View  Result Date: 06/04/2020 CLINICAL DATA:  Chest pain. EXAM: CHEST - 2 VIEW COMPARISON:  September 14, 2019 FINDINGS: The heart size and mediastinal contours are within normal limits. Both lungs are clear. The visualized skeletal structures are unremarkable. IMPRESSION: No active cardiopulmonary disease. Electronically Signed   By: Virgina Norfolk M.D.   On: 06/04/2020 17:38    Radiology No results found.  Procedures Procedures (including critical care time)  Medications Ordered in ED Medications  metoCLOPramide (REGLAN) tablet 5 mg (5 mg Oral Not Given 06/12/20 0031)  phenazopyridine (PYRIDIUM) tablet 200 mg (200 mg Oral Given 06/12/20 0027)  ketorolac (TORADOL) 30 MG/ML injection 30 mg (30 mg Intravenous Given 06/11/20 2350)  cephALEXin (KEFLEX) capsule 500 mg (500 mg Oral Given 06/12/20 0026)  acetaminophen (TYLENOL) tablet 1,000 mg (1,000 mg Oral Given 06/12/20 0026)  diphenhydrAMINE (BENADRYL) 12.5 MG/5ML elixir 6.25 mg (6.25 mg Oral Given 06/12/20 2202)    ED Course  I have reviewed the triage vital signs and the nursing notes.  Pertinent labs & imaging results that were available during my care of the patient were reviewed by me and considered in my medical decision making (see chart for details).   Patient was extremely comfortable appearing on exam prior to medication.  She is texting on phone in no distress.     Patient complains toradol gave her a migraine.  She was treated with medication for headache.  She now reports medication is not  Helping this.  I suspect that the patient is drug seeking. She does not require narcotic pain medication for a UTI and I do not treat headaches  with narcotics.  She is stable for discharge on antibiotics and pyridium.    Victoria Moore was evaluated in Emergency Department on 06/11/2020 for the symptoms described in the history of present illness. She was evaluated in the context of the global COVID-19 pandemic, which necessitated consideration that the patient might be at risk for infection with the SARS-CoV-2  virus that causes COVID-19. Institutional protocols and algorithms that pertain to the evaluation of patients at risk for COVID-19 are in a state of rapid change based on information released by regulatory bodies including the CDC and federal and state organizations. These policies and algorithms were followed during the patient's care in the ED.  Final Clinical Impression(s) / ED Diagnoses Return for intractable cough, coughing up blood,fevers >100.4 unrelieved by medication, shortness of breath, intractable vomiting, chest pain, shortness of breath, weakness,numbness, changes in speech, facial asymmetry,abdominal pain, passing out,Inability to tolerate liquids or food, cough, altered mental status or any concerns. No signs of systemic illness or infection. The patient is nontoxic-appearing on exam and vital signs are within normal limits.   I have reviewed the triage vital signs and the nursing notes. Pertinent labs &imaging results that were available during my care of the patient were reviewed by me and considered in my medical decision making (see chart for details).After history, exam, and medical workup I feel the patient has beenappropriately medically screened and is safe for discharge home. Pertinent diagnoses were discussed with the patient. Patient was given return precautions.   Honest Safranek, MD 06/12/20 [id number]

## 2020-06-11 NOTE — ED Triage Notes (Signed)
Arrived POV. Patient reports right side pain and right lower back pain. Patient also reports small amount of blood in urine.

## 2020-06-12 ENCOUNTER — Encounter (HOSPITAL_COMMUNITY): Payer: Self-pay | Admitting: Emergency Medicine

## 2020-06-12 LAB — CBC WITH DIFFERENTIAL/PLATELET
Abs Immature Granulocytes: 0.01 10*3/uL (ref 0.00–0.07)
Basophils Absolute: 0 10*3/uL (ref 0.0–0.1)
Basophils Relative: 1 %
Eosinophils Absolute: 0.3 10*3/uL (ref 0.0–0.5)
Eosinophils Relative: 5 %
HCT: 35.7 % — ABNORMAL LOW (ref 36.0–46.0)
Hemoglobin: 11.2 g/dL — ABNORMAL LOW (ref 12.0–15.0)
Immature Granulocytes: 0 %
Lymphocytes Relative: 36 %
Lymphs Abs: 1.7 10*3/uL (ref 0.7–4.0)
MCH: 27.8 pg (ref 26.0–34.0)
MCHC: 31.4 g/dL (ref 30.0–36.0)
MCV: 88.6 fL (ref 80.0–100.0)
Monocytes Absolute: 0.5 10*3/uL (ref 0.1–1.0)
Monocytes Relative: 10 %
Neutro Abs: 2.3 10*3/uL (ref 1.7–7.7)
Neutrophils Relative %: 48 %
Platelets: 150 10*3/uL (ref 150–400)
RBC: 4.03 MIL/uL (ref 3.87–5.11)
RDW: 14.4 % (ref 11.5–15.5)
WBC: 4.8 10*3/uL (ref 4.0–10.5)
nRBC: 0 % (ref 0.0–0.2)

## 2020-06-12 LAB — I-STAT CHEM 8, ED
BUN: 10 mg/dL (ref 6–20)
Calcium, Ion: 1.16 mmol/L (ref 1.15–1.40)
Chloride: 105 mmol/L (ref 98–111)
Creatinine, Ser: 1.1 mg/dL — ABNORMAL HIGH (ref 0.44–1.00)
Glucose, Bld: 119 mg/dL — ABNORMAL HIGH (ref 70–99)
HCT: 33 % — ABNORMAL LOW (ref 36.0–46.0)
Hemoglobin: 11.2 g/dL — ABNORMAL LOW (ref 12.0–15.0)
Potassium: 3.6 mmol/L (ref 3.5–5.1)
Sodium: 141 mmol/L (ref 135–145)
TCO2: 25 mmol/L (ref 22–32)

## 2020-06-12 MED ORDER — DIVALPROEX SODIUM 500 MG PO DR TAB
500.0000 mg | DELAYED_RELEASE_TABLET | Freq: Two times a day (BID) | ORAL | Status: DC
  Administered 2020-06-12: 500 mg via ORAL
  Filled 2020-06-12: qty 1

## 2020-06-12 MED ORDER — ACETAMINOPHEN 500 MG PO TABS
1000.0000 mg | ORAL_TABLET | Freq: Once | ORAL | Status: AC
  Administered 2020-06-12: 1000 mg via ORAL
  Filled 2020-06-12: qty 2

## 2020-06-12 MED ORDER — PHENAZOPYRIDINE HCL 200 MG PO TABS
200.0000 mg | ORAL_TABLET | Freq: Three times a day (TID) | ORAL | Status: DC
  Administered 2020-06-12: 200 mg via ORAL
  Filled 2020-06-12: qty 1

## 2020-06-12 MED ORDER — CEPHALEXIN 500 MG PO CAPS
500.0000 mg | ORAL_CAPSULE | Freq: Once | ORAL | Status: AC
  Administered 2020-06-12: 500 mg via ORAL
  Filled 2020-06-12: qty 1

## 2020-06-12 MED ORDER — CEPHALEXIN 500 MG PO CAPS
500.0000 mg | ORAL_CAPSULE | Freq: Four times a day (QID) | ORAL | 0 refills | Status: DC
Start: 2020-06-12 — End: 2020-07-22

## 2020-06-12 MED ORDER — PHENAZOPYRIDINE HCL 200 MG PO TABS
200.0000 mg | ORAL_TABLET | Freq: Three times a day (TID) | ORAL | 0 refills | Status: DC | PRN
Start: 2020-06-12 — End: 2020-07-22

## 2020-06-12 MED ORDER — METOCLOPRAMIDE HCL 10 MG PO TABS: 5.0000 mg | ORAL_TABLET | Freq: Once | ORAL | Status: DC

## 2020-06-12 MED ORDER — DIPHENHYDRAMINE HCL 12.5 MG/5ML PO ELIX
6.2500 mg | ORAL_SOLUTION | Freq: Once | ORAL | Status: AC
  Administered 2020-06-12: 6.25 mg via ORAL
  Filled 2020-06-12: qty 5

## 2020-06-13 ENCOUNTER — Other Ambulatory Visit: Payer: Self-pay

## 2020-06-13 ENCOUNTER — Encounter: Payer: Self-pay | Admitting: Urology

## 2020-06-13 ENCOUNTER — Ambulatory Visit (INDEPENDENT_AMBULATORY_CARE_PROVIDER_SITE_OTHER): Payer: 59 | Admitting: Urology

## 2020-06-13 VITALS — BP 122/82 | HR 91 | Ht 68.0 in | Wt 279.9 lb

## 2020-06-13 DIAGNOSIS — N3090 Cystitis, unspecified without hematuria: Secondary | ICD-10-CM | POA: Diagnosis not present

## 2020-06-13 NOTE — Patient Instructions (Signed)

## 2020-06-13 NOTE — Progress Notes (Signed)
06/13/2020 3:22 PM   Victoria Moore [birth year] [id number]  Referring provider: [name], MD [place],  [place] [address]  Chief Complaint  Patient presents with  . Cystitis    HPI: Victoria Moore was referred for gross hematuria. She was seen in the emergency room 2 days ago with hematuria and flank pain.  UA showed rare bacteria, greater than 50 red blood cells per high-powered field.  White count was 5.  She was afebrile with stable vitals.  I did not see that a urine culture was sent. She is on abx. Urine cleared by the time she got to the ED. She had a couple of prior episodes of gross hematuria. She think she had one about 6 months. She is an infrequent voider. Good stream. Cr 1.1. She had a roux-en-y bypass. She's lost 50 lbs.   Pelvic surgery includes abdominal hysterectomy 3-4 yrs ago.   She has crampy abd pain, radiates to back. No dysuria.   UA today with 11-30 wbc, few bacteria. No rbcs.   PMH: Past Medical History:  Diagnosis Date  . Acute low back pain without sciatica 06/22/2018  . ADHD   . Allergy   . Anemia   . Anxiety   . Arthritis   . 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 Fibromyalgia, doubt sarcoid   . Asthma   . Blood transfusion without reported diagnosis   . Chest wall pain 05/26/2018  . Chronic kidney disease   . Chronic low back pain with sciatica 09/15/2018  . Common migraine with intractable migraine 10/07/2016  . Cough variant asthma vs UACS  10/23/2016   - Spirometry 04/15/2016  Very truncated exp loop effort dep portion only  - Allergy profile 10/22/2016 >  Eos 0.2/  IgE  52 RAST POS grass/trees/ragweed  10/22/2016  After extensive coaching HFA effectiveness =    75% try duelra 100 2bid > improved  . Daytime sleepiness 05/26/2018  . Decreased pedal pulses 10/04/2018  . Depression   . Depression, major, single episode, moderate ([place]) 05/26/2018  . Diabetes ([place])   . Dyspnea  04/15/2016   04/15/2016  Walked RA x 3 laps @ 185 ft each stopped due to  End of study, nl pace, no sob or desat    - Spirometry 04/15/2016  Very truncated exp loop effort dep portion only  - 10/22/2016  Walked RA x 3 laps @ 185 ft each stopped due to  End of study, slow pace, min sob/ no desat - full pfts rec 10/22/2016 >>>      . Edema   . Essential hypertension, benign 11/15/2016  . Fibroids 03/11/2016  . Frequency of urination 09/11/2018  . GERD (gastroesophageal reflux disease)   . Herniated lumbar intervertebral disc   . Hiatal hernia   . Hilar adenopathy   . Hypertension   . Hypokalemia 06/28/2018  . Impaired fasting blood sugar 09/11/2018  . Iron deficiency   . Leg pain 12/07/2018  . Low libido 11/27/2018  . Migraine   . Mild sleep apnea 08/03/2018   HST 06/20/18  AHI  8.1 / snoring with 02 nadir 80% >  08/03/2018 rec sleep medicine consultation   . Morbid (severe) obesity due to excess calories ([place]) 10/23/2016  . Personal history of sarcoidosis 05/26/2018  . Prolonged capillary refill time   . Right leg swelling 08/18/2018  . Sarcoidosis    personal history of  . Seizures (  [place])    history of   . Sleep apnea   . Spondylosis   . Vitamin D deficiency     Surgical History: Past Surgical History:  Procedure Laterality Date  . ABDOMINAL HYSTERECTOMY    . ESOPHAGEAL MANOMETRY N/A 09/05/2019   Procedure: ESOPHAGEAL MANOMETRY (EM);  Surgeon: Lavena Bullion, DO;  Location: [facility];  Service: Gastroenterology;  Laterality: N/A;  . LAPAROSCOPIC OVARIAN CYSTECTOMY Left 03/11/2016   Procedure: LAPAROSCOPIC OVARIAN CYSTECTOMY;  Surgeon: Eldred Manges, MD;  Location: [facility];  Service: Gynecology;  Laterality: Left;  . LAPAROSCOPIC VAGINAL HYSTERECTOMY WITH SALPINGECTOMY Bilateral 03/11/2016   Procedure: LAPAROSCOPIC ASSISTED VAGINAL HYSTERECTOMY WITH SALPINGECTOMY;  Surgeon: Eldred Manges, MD;  Location: [facility];  Service: Gynecology;  Laterality: Bilateral;  . [place] IMPEDANCE STUDY   09/05/2019   Procedure: [place] IMPEDANCE STUDY;  Surgeon: Lavena Bullion, DO;  Location: [facility];  Service: Gastroenterology;;  . TUBAL LIGATION    . UPPER GASTROINTESTINAL ENDOSCOPY  10/11/2019   06/2019    Home Medications:  Allergies as of 06/13/2020      Reactions   Aspirin Anaphylaxis, Hives          Medication List       Accurate as of June 13, 2020  3:22 PM. If you have any questions, ask your nurse or doctor.        Acetaminophen Extra Strength 500 MG tablet Generic drug: acetaminophen Take 1,000 mg by mouth every 6 (six) hours as needed for pain.   acetaZOLAMIDE 500 MG capsule Commonly known as: DIAMOX Take 2 capsules (1,000 mg total) by mouth 2 (two) times daily.   albuterol (2.5 MG/3ML) 0.083% nebulizer solution Commonly known as: PROVENTIL Take 2.5 mg by nebulization every 6 (six) hours as needed for wheezing or shortness of breath.   albuterol 108 (90 Base) MCG/ACT inhaler Commonly known as: VENTOLIN HFA Inhale 1-2 puffs into the lungs every 4 (four) hours as needed for wheezing or shortness of breath.   ALPRAZolam 1 MG tablet Commonly known as: XANAX Take 0.5-1 tablets (0.5-1 mg total) by mouth at bedtime as needed for anxiety.   amphetamine-dextroamphetamine 20 MG 24 hr capsule Commonly known as: ADDERALL XR Take 20 mg by mouth in the morning.   cephALEXin 500 MG capsule Commonly known as: KEFLEX Take 1 capsule (500 mg total) by mouth 4 (four) times daily.   chlorthalidone 25 MG tablet Commonly known as: HYGROTON TAKE 1 TABLET BY MOUTH EVERY DAY   citalopram 20 MG tablet Commonly known as: CELEXA TAKE 1 TABLET BY MOUTH EVERY DAY   ferrous sulfate 325 (65 FE) MG tablet Take 325 mg by mouth daily with breakfast.   Fluticasone-Salmeterol 100-50 MCG/DOSE Aepb Commonly known as: Advair Diskus Inhale 1 puff into the lungs 2 (two) times daily.   HYDROcodone-acetaminophen 5-325 MG tablet Commonly known as: NORCO/VICODIN Take 1 tablet by mouth  every 6 (six) hours as needed. What changed: reasons to take this   hyoscyamine 0.125 MG SL tablet Commonly known as: LEVSIN SL Place 1 tablet (0.125 mg total) under the tongue every 6 (six) hours as needed.   mometasone-formoterol 100-5 MCG/ACT Aero Commonly known as: DULERA Inhale 2 puffs into the lungs 2 (two) times daily.   multivitamin with minerals Tabs tablet Take 1 tablet by mouth daily.   nortriptyline 25 MG capsule Commonly known as: PAMELOR Take 2 capsules at bedtime.   pantoprazole 40 MG tablet Commonly known as: PROTONIX Take 40 mg by mouth daily.  phenazopyridine 200 MG tablet Commonly known as: Pyridium Take 1 tablet (200 mg total) by mouth 3 (three) times daily as needed for pain.   potassium chloride SA 20 MEQ tablet Commonly known as: KLOR-CON Take 1 tablet (20 mEq total) by mouth 2 (two) times daily for 5 days.   scopolamine 1 MG/3DAYS Commonly known as: TRANSDERM-SCOP Place 1 patch onto the skin every 3 (three) days.   spironolactone 25 MG tablet Commonly known as: ALDACTONE TAKE 1 TABLET BY MOUTH EVERY DAY   SUMAtriptan 100 MG tablet Commonly known as: IMITREX TAKE 1 TABLET BY MOUTH 2X DAILY AS NEEDED FOR MIGRAINE. MAY REPEAT IN 2 HOURS IF HEADACHE PERSISTS OR RECURS.   testosterone cypionate 200 MG/ML injection Commonly known as: DEPOTESTOSTERONE CYPIONATE Inject 200 mg into the muscle every 30 (thirty) days.   traMADol 50 MG tablet Commonly known as: ULTRAM Take 50 mg by mouth every 6 (six) hours as needed for moderate pain.       Allergies:  Allergies  Allergen Reactions  . Aspirin Anaphylaxis and Hives         Family History: Family History  Adopted: Yes  Problem Relation Age of Onset  . Other Son        Growing pains  . Asthma Mother   . Heart Problems Mother   . Migraines Mother   . COPD Mother   . Heart attack Mother   . Diabetes Father   . Peptic Ulcer Father   . Heart Problems Father   . COPD Father   . Heart  attack Father   . Colon cancer Neg Hx   . Colon polyps Neg Hx   . Esophageal cancer Neg Hx   . Rectal cancer Neg Hx   . Stomach cancer Neg Hx     Social History:  reports that she quit smoking about 7 years ago. Her smoking use included cigarettes. She has a 4.25 pack-year smoking history. She has never used smokeless tobacco. She reports previous drug use. Drug: Marijuana. She reports that she does not drink alcohol.   Physical Exam: BP 122/82   Pulse 91   Ht 5\' 8"  (1.727 m)   Wt 279 lb 14.4 oz (127 kg)   LMP 02/19/2016   BMI 42.56 kg/m   Constitutional:  Alert and oriented, No acute distress. HEENT: [place] AT, moist mucus membranes.  Trachea midline, no masses. Cardiovascular: No clubbing, cyanosis, or edema. Respiratory: Normal respiratory effort, no increased work of breathing. GI: Abdomen is soft, nontender, nondistended, no abdominal masses GU: No CVA tenderness Skin: No rashes, bruises or suspicious lesions. Neurologic: Grossly intact, no focal deficits, moving all 4 extremities. Psychiatric: Normal mood and affect.  Laboratory Data: Lab Results  Component Value Date   WBC 4.8 06/12/2020   HGB 11.2 (L) 06/12/2020   HCT 33.0 (L) 06/12/2020   MCV 88.6 06/12/2020   PLT 150 06/12/2020    Lab Results  Component Value Date   CREATININE 1.10 (H) 06/12/2020    No results found for: PSA  No results found for: TESTOSTERONE  Lab Results  Component Value Date   HGBA1C 6.0 (H) 04/28/2020    Urinalysis    Component Value Date/Time   COLORURINE YELLOW 06/11/2020 2205   APPEARANCEUR CLOUDY (A) 06/11/2020 2205   LABSPEC 1.030 06/11/2020 2205   LABSPEC 1.015 11/27/2018 1008   PHURINE 5.0 06/11/2020 2205   GLUCOSEU NEGATIVE 06/11/2020 2205   HGBUR LARGE (A) 06/11/2020 2205   BILIRUBINUR SMALL (A) 06/11/2020 2205  BILIRUBINUR moderate 11/07/2019 1619   KETONESUR 5 (A) 06/11/2020 2205   PROTEINUR 100 (A) 06/11/2020 2205   UROBILINOGEN 0.2 11/07/2019 1619    UROBILINOGEN 0.2 07/15/2019 1213   NITRITE NEGATIVE 06/11/2020 2205   LEUKOCYTESUR LARGE (A) 06/11/2020 2205    Lab Results  Component Value Date   BACTERIA RARE (A) 06/11/2020    Pertinent Imaging: CT scan  No results found for this or any previous visit.  Results for orders placed during the hospital encounter of 09/12/18  US Venous Img Lower Bilateral  Narrative CLINICAL DATA:  Bilateral leg swelling x2 weeks.  EXAM: BILATERAL LOWER EXTREMITY VENOUS DOPPLER ULTRASOUND  TECHNIQUE: Gray-scale sonography with graded compression, as well as color Doppler and duplex ultrasound were performed to evaluate the lower extremity deep venous systems from the level of the common femoral vein and including the common femoral, femoral, profunda femoral, popliteal and calf veins including the posterior tibial, peroneal and gastrocnemius veins when visible. The superficial great saphenous vein was also interrogated. Spectral Doppler was utilized to evaluate flow at rest and with distal augmentation maneuvers in the common femoral, femoral and popliteal veins.  COMPARISON:  None.  FINDINGS: RIGHT LOWER EXTREMITY  Common Femoral Vein: No evidence of thrombus. Normal compressibility, respiratory phasicity and response to augmentation.  Saphenofemoral Junction: No evidence of thrombus. Normal compressibility and flow on color Doppler imaging.  Profunda Femoral Vein: No evidence of thrombus. Normal compressibility and flow on color Doppler imaging.  Femoral Vein: No evidence of thrombus. Normal compressibility, respiratory phasicity and response to augmentation.  Popliteal Vein: No evidence of thrombus. Normal compressibility, respiratory phasicity and response to augmentation.  Calf Veins: No evidence of thrombus. The right peroneal vein however was not visualized. Normal compressibility and flow on color Doppler imaging.  Superficial Great Saphenous Vein: No evidence of  thrombus. Normal compressibility.  Venous Reflux:  None.  Other Findings:  None.  LEFT LOWER EXTREMITY  Common Femoral Vein: No evidence of thrombus. Normal compressibility, respiratory phasicity and response to augmentation.  Saphenofemoral Junction: No evidence of thrombus. Normal compressibility and flow on color Doppler imaging.  Profunda Femoral Vein: No evidence of thrombus. Normal compressibility and flow on color Doppler imaging.  Femoral Vein: No evidence of thrombus. Normal compressibility, respiratory phasicity and response to augmentation.  Popliteal Vein: No evidence of thrombus. Normal compressibility, respiratory phasicity and response to augmentation.  Calf Veins: No evidence of thrombus. Limited visualization of the peroneal vein without acute appearing abnormality. Normal compressibility and flow on color Doppler imaging.  Superficial Great Saphenous Vein: No evidence of thrombus. Normal compressibility.  Venous Reflux:  None.  Other Findings:  None.  IMPRESSION: No evidence of deep venous thrombosis.   Electronically Signed By: Ashley Royalty M.D. On: 09/12/2018 03:29  No results found for this or any previous visit.  No results found for this or any previous visit.  No results found for this or any previous visit.  No results found for this or any previous visit.  No results found for this or any previous visit.  Results for orders placed during the hospital encounter of 06/11/20  CT Renal Stone Study  Narrative CLINICAL DATA:  Right abdominal and back pain, small volume hematuria.  EXAM: CT ABDOMEN AND PELVIS WITHOUT CONTRAST  TECHNIQUE: Multidetector CT imaging of the abdomen and pelvis was performed following the standard protocol without IV contrast.  COMPARISON:  CT 04/27/2020  FINDINGS: Lower chest: Lung bases are clear. Normal heart size. No pericardial effusion.  Hepatobiliary: Focal fatty infiltration along the  falciform ligament. No visible concerning focal liver lesions on this noncontrast CT examination. Gallbladder and biliary tree. No visible calcified gallstones.  Pancreas: Unremarkable. No pancreatic ductal dilatation or surrounding inflammatory changes.  Spleen: Normal in size without focal abnormality.  Adrenals/Urinary Tract: Normal adrenals. No visible or contour deforming renal lesions. No urolithiasis or hydronephrosis. Urinary bladder is largely decompressed at the time of exam and therefore poorly evaluated by CT imaging. No gross bladder abnormality is seen.  Stomach/Bowel: Distal esophagus is unremarkable. There are postsurgical changes of the stomach and proximal bowel in an antecolic Roux-en-Y bypass configuration. Incompletely assessed in the absence of enteric contrast media, there is no gross evidence of obstruction or complication at this time. No distal small bowel thickening or dilatation. A normal appendix is visualized. No colonic dilatation or wall thickening. Mild redundancy of the sigmoid.  Vascular/Lymphatic: Minimal atheromatous plaque in the abdominal aorta. No aneurysm or ectasia. No other significant vascular findings. No worrisome abdominopelvic adenopathy.  Reproductive: Uterus is surgically absent. No concerning adnexal lesions.  Other: No abdominopelvic free air or fluid. No bowel containing hernias. Chronic soft tissue infiltration along the anterior abdominal wall compatible with sites of prior port placement.  Musculoskeletal: No acute osseous abnormality or suspicious osseous lesion. Mild degenerative disc disease and facet degenerative changes in the lower lumbar levels including vacuum disc at L5-S1 and some vacuum phenomena about the L4-S1 facets as well.  IMPRESSION: 1. No acute urinary tract abnormality to provide cause for patient's symptoms, specifically no evidence of urolithiasis or hydronephrosis. Bladder is poorly assessed due  to underdistention though no gross abnormality is seen. 2. Postsurgical changes of the stomach and proximal bowel in an antecolic Roux-en-Y bypass configuration. Suboptimal assessment in the absence of enteric contrast media, there is no gross evidence of obstruction or complication at this time. 3. Mild degenerative disc disease and facet degenerative changes in the lower lumbar levels including vacuum disc at L5-S1 and some vacuum phenomena about the L4-S1 facets as well. 4. Aortic Atherosclerosis (ICD10-I70.0).   Electronically Signed By: Lovena Le M.D. On: 06/11/2020 23:44   Assessment & Plan:    1. Gross hematuria - continue cephalexin. F/u for exam and cystoscopy. Send urine for c although pt on abx.  - Urinalysis, Complete - CULTURE, URINE COMPREHENSIVE   No follow-ups on file.  Festus Aloe, MD  [facility] [address] [place],  [address] [phone]

## 2020-06-16 ENCOUNTER — Emergency Department
Admission: EM | Admit: 2020-06-16 | Discharge: 2020-06-16 | Disposition: A | Discharge status: Home / Self Care | Payer: 59 | Attending: Emergency Medicine | Admitting: Emergency Medicine

## 2020-06-16 ENCOUNTER — Other Ambulatory Visit: Payer: Self-pay

## 2020-06-16 DIAGNOSIS — R1012 Left upper quadrant pain: Secondary | ICD-10-CM | POA: Diagnosis present

## 2020-06-16 DIAGNOSIS — Z87891 Personal history of nicotine dependence: Secondary | ICD-10-CM | POA: Diagnosis not present

## 2020-06-16 DIAGNOSIS — Z79899 Other long term (current) drug therapy: Secondary | ICD-10-CM | POA: Insufficient documentation

## 2020-06-16 DIAGNOSIS — F909 Attention-deficit hyperactivity disorder, unspecified type: Secondary | ICD-10-CM | POA: Insufficient documentation

## 2020-06-16 DIAGNOSIS — R1084 Generalized abdominal pain: Secondary | ICD-10-CM | POA: Insufficient documentation

## 2020-06-16 DIAGNOSIS — R519 Headache, unspecified: Secondary | ICD-10-CM | POA: Diagnosis not present

## 2020-06-16 DIAGNOSIS — I10 Essential (primary) hypertension: Secondary | ICD-10-CM | POA: Insufficient documentation

## 2020-06-16 LAB — BASIC METABOLIC PANEL
Anion gap: 8 (ref 5–15)
BUN: 12 mg/dL (ref 6–20)
CO2: 25 mmol/L (ref 22–32)
Calcium: 8.7 mg/dL — ABNORMAL LOW (ref 8.9–10.3)
Chloride: 108 mmol/L (ref 98–111)
Creatinine, Ser: 1.34 mg/dL — ABNORMAL HIGH (ref 0.44–1.00)
GFR calc Af Amer: 55 mL/min — ABNORMAL LOW (ref >60)
GFR calc non Af Amer: 47 mL/min — ABNORMAL LOW (ref >60)
Glucose, Bld: 106 mg/dL — ABNORMAL HIGH (ref 70–99)
Potassium: 3.9 mmol/L (ref 3.5–5.1)
Sodium: 141 mmol/L (ref 135–145)

## 2020-06-16 LAB — URINALYSIS, COMPLETE (UACMP) WITH MICROSCOPIC
Bacteria, UA: NONE SEEN
Bilirubin Urine: NEGATIVE
Glucose, UA: NEGATIVE mg/dL
Hgb urine dipstick: NEGATIVE
Ketones, ur: NEGATIVE mg/dL
Nitrite: NEGATIVE
Protein, ur: NEGATIVE mg/dL
Specific Gravity, Urine: 1.02 (ref 1.005–1.030)
pH: 5 (ref 5.0–8.0)

## 2020-06-16 LAB — CULTURE, URINE COMPREHENSIVE

## 2020-06-16 LAB — CBC
HCT: 35.3 % — ABNORMAL LOW (ref 36.0–46.0)
Hemoglobin: 11.3 g/dL — ABNORMAL LOW (ref 12.0–15.0)
MCH: 27.4 pg (ref 26.0–34.0)
MCHC: 32 g/dL (ref 30.0–36.0)
MCV: 85.7 fL (ref 80.0–100.0)
Platelets: 177 10*3/uL (ref 150–400)
RBC: 4.12 MIL/uL (ref 3.87–5.11)
RDW: 14.3 % (ref 11.5–15.5)
WBC: 3.6 10*3/uL — ABNORMAL LOW (ref 4.0–10.5)
nRBC: 0 % (ref 0.0–0.2)

## 2020-06-16 MED ORDER — HYDROCODONE-ACETAMINOPHEN 5-325 MG PO TABS: 1.0000 | ORAL_TABLET | ORAL | 0 refills | Status: DC | PRN

## 2020-06-16 NOTE — ED Triage Notes (Signed)
Pt c/o left flank pain since 6/9, states she was recently dx with UTI and given abx that she is currently still taking. States her sx are getting worse and not better., pt is in NAD at present.

## 2020-06-16 NOTE — ED Provider Notes (Signed)
[facility] Emergency Department Provider Note  Time seen: 12:10 PM  I have reviewed the triage vital signs and the nursing notes.   HISTORY  Chief Complaint Flank Pain   HPI Victoria Moore is a 44 y.o. female with a past medical history of anemia, anxiety, CKD, diabetes, obesity,  migraines, sarcoidosis, presents to the emergency department for abdominal pain.  According to the patient for the past 2 to 3 weeks she has been experiencing abdominal pain which she states is fairly diffuse was initially more in the right mid abdomen but now is more in the left upper quadrant.  Patient states she was seen in the emergency department at [facility] 1 week ago diagnosed with urinary tract infection but finished her antibiotics and states minimal improvement.  Patient states she was at work today and she had to leave due to discomfort.  Patient denies any dysuria or hematuria.  Denies any vomiting or diarrhea.  No fever.  Does state intermittent mild headaches but that is fairly chronic as well.  Past Medical History:  Diagnosis Date  . Acute low back pain without sciatica 06/22/2018  . ADHD   . Allergy   . Anemia   . Anxiety   . Arthritis   . 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 Fibromyalgia, doubt sarcoid   . Asthma   . Blood transfusion without reported diagnosis   . Chest wall pain 05/26/2018  . Chronic kidney disease   . Chronic low back pain with sciatica 09/15/2018  . Common migraine with intractable migraine 10/07/2016  . Cough variant asthma vs UACS  10/23/2016   - Spirometry 04/15/2016  Very truncated exp loop effort dep portion only  - Allergy profile 10/22/2016 >  Eos 0.2/  IgE  52 RAST POS grass/trees/ragweed  10/22/2016  After extensive coaching HFA effectiveness =    75% try duelra 100 2bid > improved  . Daytime sleepiness 05/26/2018  . Decreased pedal pulses 10/04/2018  . Depression   . Depression, major,  single episode, moderate ([place]) 05/26/2018  . Diabetes ([place])   . Dyspnea 04/15/2016   04/15/2016  Walked RA x 3 laps @ 185 ft each stopped due to  End of study, nl pace, no sob or desat    - Spirometry 04/15/2016  Very truncated exp loop effort dep portion only  - 10/22/2016  Walked RA x 3 laps @ 185 ft each stopped due to  End of study, slow pace, min sob/ no desat - full pfts rec 10/22/2016 >>>      . Edema   . Essential hypertension, benign 11/15/2016  . Fibroids 03/11/2016  . Frequency of urination 09/11/2018  . GERD (gastroesophageal reflux disease)   . Herniated lumbar intervertebral disc   . Hiatal hernia   . Hilar adenopathy   . Hypertension   . Hypokalemia 06/28/2018  . Impaired fasting blood sugar 09/11/2018  . Iron deficiency   . Leg pain 12/07/2018  . Low libido 11/27/2018  . Migraine   . Mild sleep apnea 08/03/2018   HST 06/20/18  AHI  8.1 / snoring with 02 nadir 80% >  08/03/2018 rec sleep medicine consultation   . Morbid (severe) obesity due to excess calories ([place]) 10/23/2016  . Personal history of sarcoidosis 05/26/2018  . Prolonged capillary refill time   . Right leg swelling 08/18/2018  . Sarcoidosis    personal history of  . Seizures ([place])  history of   . Sleep apnea   . Spondylosis   . Vitamin D deficiency     Patient Active Problem List   Diagnosis Date Noted  . Intractable abdominal pain 04/27/2020  . Other intervertebral disc degeneration, lumbar region 09/19/2019  . Complex cyst of left ovary 02/01/2019  . Sarcoidosis 02/01/2019  . Leg pain 12/07/2018  . Low libido 11/27/2018  . Prolonged capillary refill time 10/04/2018  . Decreased pedal pulses 10/04/2018  . Spondylosis 09/15/2018  . Chronic low back pain with sciatica 09/15/2018  . Impaired fasting blood sugar 09/11/2018  . Weight gain 09/11/2018  . Mild sleep apnea 08/03/2018  . Hypokalemia 06/28/2018  . Medication side effect 06/22/2018  . Acute low back pain without sciatica 06/22/2018  . Personal  history of sarcoidosis 05/26/2018  . Vitamin D deficiency 05/26/2018  . Encounter for health maintenance examination with abnormal findings 05/26/2018  . Depression, major, single episode, moderate ([place]) 05/26/2018  . Daytime sleepiness 05/26/2018  . Chest pain of uncertain etiology 19/50/9326  . GERD (gastroesophageal reflux disease) 05/26/2018  . Essential hypertension, benign 11/15/2016  . 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 Morbid (severe) obesity due to excess calories ([place]) 10/23/2016  . Common migraine with intractable migraine 10/07/2016  . Dyspnea 04/15/2016  . Hilar adenopathy 04/15/2016  . Seizures ([place]) 03/11/2016  . Anemia 11/12/2014  . Iron deficiency 11/12/2014    Past Surgical History:  Procedure Laterality Date  . ABDOMINAL HYSTERECTOMY    . ESOPHAGEAL MANOMETRY N/A 09/05/2019   Procedure: ESOPHAGEAL MANOMETRY (EM);  Surgeon: Lavena Bullion, DO;  Location: [facility];  Service: Gastroenterology;  Laterality: N/A;  . LAPAROSCOPIC OVARIAN CYSTECTOMY Left 03/11/2016   Procedure: LAPAROSCOPIC OVARIAN CYSTECTOMY;  Surgeon: Eldred Manges, MD;  Location: [facility];  Service: Gynecology;  Laterality: Left;  . LAPAROSCOPIC VAGINAL HYSTERECTOMY WITH SALPINGECTOMY Bilateral 03/11/2016   Procedure: LAPAROSCOPIC ASSISTED VAGINAL HYSTERECTOMY WITH SALPINGECTOMY;  Surgeon: Eldred Manges, MD;  Location: [facility];  Service: Gynecology;  Laterality: Bilateral;  . [place] IMPEDANCE STUDY  09/05/2019   Procedure: [place] IMPEDANCE STUDY;  Surgeon: Lavena Bullion, DO;  Location: [facility];  Service: Gastroenterology;;  . TUBAL LIGATION    . UPPER GASTROINTESTINAL ENDOSCOPY  10/11/2019   06/2019    Prior to Admission medications   Medication Sig Start Date End Date Taking? Authorizing Provider  ACETAMINOPHEN EXTRA STRENGTH 500 MG tablet Take 1,000 mg by mouth every 6 (six) hours as needed for pain. 04/08/20   [provider]  acetaZOLAMIDE (DIAMOX) 500 MG capsule Take 2  capsules (1,000 mg total) by mouth 2 (two) times daily. 01/28/20   Kathrynn Ducking, MD  albuterol (PROVENTIL) (2.5 MG/3ML) 0.083% nebulizer solution Take 2.5 mg by nebulization every 6 (six) hours as needed for wheezing or shortness of breath.    [provider]  albuterol (VENTOLIN HFA) 108 (90 Base) MCG/ACT inhaler Inhale 1-2 puffs into the lungs every 4 (four) hours as needed for wheezing or shortness of breath. 01/14/20   Parrett, Fonnie Mu, NP  ALPRAZolam Duanne Moron) 1 MG tablet Take 0.5-1 tablets (0.5-1 mg total) by mouth at bedtime as needed for anxiety. 12/05/19   Ngetich, Dinah C, NP  amphetamine-dextroamphetamine (ADDERALL XR) 20 MG 24 hr capsule Take 20 mg by mouth in the morning. 04/08/20   [provider]  cephALEXin (KEFLEX) 500 MG capsule Take 1 capsule (500 mg total) by mouth 4 (four) times daily. 06/12/20   Palumbo, April, MD  chlorthalidone (HYGROTON)  25 MG tablet TAKE 1 TABLET BY MOUTH EVERY DAY Patient taking differently: Take 25 mg by mouth daily.  11/05/19   Ngetich, Dinah C, NP  citalopram (CELEXA) 20 MG tablet TAKE 1 TABLET BY MOUTH EVERY DAY 01/14/20   Ngetich, Dinah C, NP  ferrous sulfate 325 (65 FE) MG tablet Take 325 mg by mouth daily with breakfast.    [provider]  Fluticasone-Salmeterol (ADVAIR DISKUS) 100-50 MCG/DOSE AEPB Inhale 1 puff into the lungs 2 (two) times daily. 11/20/19   Parrett, Fonnie Mu, NP  HYDROcodone-acetaminophen (NORCO/VICODIN) 5-325 MG tablet Take 1 tablet by mouth every 6 (six) hours as needed. Patient taking differently: Take 1 tablet by mouth every 6 (six) hours as needed for moderate pain.  06/05/20   McDonald, Mia A, PA-C  hyoscyamine (LEVSIN SL) 0.125 MG SL tablet Place 1 tablet (0.125 mg total) under the tongue every 6 (six) hours as needed. 11/05/19   Cirigliano, Vito V, DO  mometasone-formoterol (DULERA) 100-5 MCG/ACT AERO Inhale 2 puffs into the lungs 2 (two) times daily. 08/10/19   Ngetich, Dinah C, NP  Multiple Vitamin  (MULTIVITAMIN WITH MINERALS) TABS tablet Take 1 tablet by mouth daily.    [provider]  nortriptyline (PAMELOR) 25 MG capsule Take 2 capsules at bedtime. Patient not taking: Reported on 06/13/2020 12/17/19   Suzzanne Cloud, NP  pantoprazole (PROTONIX) 40 MG tablet Take 40 mg by mouth daily. 04/08/20   [provider]  phenazopyridine (PYRIDIUM) 200 MG tablet Take 1 tablet (200 mg total) by mouth 3 (three) times daily as needed for pain. 06/12/20   Palumbo, April, MD  potassium chloride SA (KLOR-CON) 20 MEQ tablet Take 1 tablet (20 mEq total) by mouth 2 (two) times daily for 5 days. Patient not taking: Reported on 06/12/2020 06/05/20 06/10/20  McDonald, Mia A, PA-C  scopolamine (TRANSDERM-SCOP) 1 MG/3DAYS Place 1 patch onto the skin every 3 (three) days. 04/08/20   [provider]  spironolactone (ALDACTONE) 25 MG tablet TAKE 1 TABLET BY MOUTH EVERY DAY 11/05/19   Ngetich, Dinah C, NP  SUMAtriptan (IMITREX) 100 MG tablet TAKE 1 TABLET BY MOUTH 2X DAILY AS NEEDED FOR MIGRAINE. MAY REPEAT IN 2 HOURS IF HEADACHE PERSISTS OR RECURS. Patient not taking: Reported on 06/13/2020 12/11/19   Kathrynn Ducking, MD  testosterone cypionate (DEPOTESTOSTERONE CYPIONATE) 200 MG/ML injection Inject 200 mg into the muscle every 30 (thirty) days. 03/28/20   [provider]  traMADol (ULTRAM) 50 MG tablet Take 50 mg by mouth every 6 (six) hours as needed for moderate pain.  03/27/20   [provider]    Allergies  Allergen Reactions  . Aspirin Anaphylaxis and Hives         Family History  Adopted: Yes  Problem Relation Age of Onset  . Other Son        Growing pains  . Asthma Mother   . Heart Problems Mother   . Migraines Mother   . COPD Mother   . Heart attack Mother   . Diabetes Father   . Peptic Ulcer Father   . Heart Problems Father   . COPD Father   . Heart attack Father   . Colon cancer Neg Hx   . Colon polyps Neg Hx   . Esophageal cancer Neg Hx   . Rectal  cancer Neg Hx   . Stomach cancer Neg Hx     Social History Social History   Tobacco Use  . Smoking status: Former Smoker  Packs/day: 0.25    Years: 17.00    Pack years: 4.25    Types: Cigarettes    Quit date: 03/02/2013    Years since quitting: 7.2  . Smokeless tobacco: Never Used  Vaping Use  . Vaping Use: Never used  Substance Use Topics  . Alcohol use: No    Alcohol/week: 0.0 standard drinks  . Drug use: Not Currently    Types: Marijuana    Comment: former - 6 yrs ago    Review of Systems Constitutional: Negative for fever. Cardiovascular: Negative for chest pain. Respiratory: Negative for shortness of breath. Gastrointestinal: Generalized abdominal pain.  Negative for vomiting or diarrhea. Genitourinary: Negative for urinary compaints Musculoskeletal: Negative for musculoskeletal complaints Neurological: Mild intermittent headaches. All other ROS negative  ____________________________________________   PHYSICAL EXAM:  VITAL SIGNS: ED Triage Vitals  Enc Vitals Group     BP 06/16/20 1016 129/81     Pulse Rate 06/16/20 1016 72     Resp 06/16/20 1016 16     Temp 06/16/20 1019 98.2 F (36.8 C)     Temp Source 06/16/20 1016 Oral     SpO2 06/16/20 1016 100 %     Weight 06/16/20 1017 278 lb (126.1 kg)     Height 06/16/20 1017 5\' 8"  (1.727 m)     Head Circumference --      Peak Flow --      Pain Score 06/16/20 1016 10     Pain Loc --      Pain Edu? --      Excl. in [place]? --     Constitutional: Alert and oriented. Well appearing and in no distress. Eyes: Normal exam ENT      Head: Normocephalic and atraumatic.      Mouth/Throat: Mucous membranes are moist. Cardiovascular: Normal rate, regular rhythm.  Respiratory: Normal respiratory effort without tachypnea nor retractions. Breath sounds are clear Gastrointestinal: Soft, obese, mild diffuse tenderness.  No focal area of tenderness identified.  No rebound guarding or distention. Musculoskeletal: Nontender  with normal range of motion in all extremities. Neurologic:  Normal speech and language. No gross focal neurologic deficits  Skin:  Skin is warm, dry and intact.  Psychiatric: Mood and affect are normal. Speech and behavior are normal.    INITIAL IMPRESSION / ASSESSMENT AND PLAN / ED COURSE  Pertinent labs & imaging results that were available during my care of the patient were reviewed by me and considered in my medical decision making (see chart for details).   Patient presents emergency department for generalized abdominal pain.  I reviewed the patient's visit from 1 week ago, she had a CT scan performed at that time with largely negative findings.  Patient's work-up was largely nonrevealing.  Patient's lab work today is largely unchanged.  There are white cells but no bacteria seen.  Patient denies any vaginal discharge or lower abdominal discomfort.  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  Given her more upper abdominal discomfort I spoke to the patient regarding GI follow-up for possible endoscopy.  We will discharge with a very short course of pain medication, refer to GI medicine.  I spoke to the patient regarding return precautions.  Patient agreeable to plan of care.  Envi Eagleson was evaluated in Emergency Department on 06/16/2020 for the symptoms described in the history of present illness. She was evaluated in the context of the global COVID-19 pandemic, which necessitated consideration that the patient might be at risk for  infection with the SARS-CoV-2 virus that causes COVID-19. Institutional protocols and algorithms that pertain to the evaluation of patients at risk for COVID-19 are in a state of rapid change based on information released by regulatory bodies including the CDC and federal and state organizations. These policies and algorithms were followed during the patient's care in the ED.  ____________________________________________   FINAL CLINICAL  IMPRESSION(S) / ED DIAGNOSES  Abdominal pain    Harvest Dark, MD 06/16/20 1214

## 2020-06-16 NOTE — ED Notes (Signed)
Dr. Paduchowski at bedside.  

## 2020-06-16 NOTE — ED Notes (Signed)
See triage note, pt reports mid left sided abdominal pain and back pain since early June with N/V and recent diarrhea. Denies vomitting. Pt alert and oriented

## 2020-06-17 LAB — URINALYSIS, COMPLETE
Bilirubin, UA: NEGATIVE
Glucose, UA: NEGATIVE
Nitrite, UA: POSITIVE — AB
Specific Gravity, UA: 1.025 (ref 1.005–1.030)
Urobilinogen, Ur: 1 mg/dL (ref 0.2–1.0)
pH, UA: 5 (ref 5.0–7.5)

## 2020-06-17 LAB — MICROSCOPIC EXAMINATION: Epithelial Cells (non renal): >10 /hpf — AB (ref 0–10)

## 2020-06-17 LAB — POCT PREGNANCY, URINE: Preg Test, Ur: NEGATIVE

## 2020-06-22 ENCOUNTER — Other Ambulatory Visit: Payer: Self-pay | Admitting: Neurology

## 2020-07-04 ENCOUNTER — Encounter: Payer: Self-pay | Admitting: Urology

## 2020-07-04 ENCOUNTER — Other Ambulatory Visit: Payer: Self-pay

## 2020-07-04 ENCOUNTER — Ambulatory Visit (INDEPENDENT_AMBULATORY_CARE_PROVIDER_SITE_OTHER): Payer: 59 | Admitting: Urology

## 2020-07-04 VITALS — BP 121/85 | HR 93 | Ht 68.0 in | Wt 279.0 lb

## 2020-07-04 DIAGNOSIS — N3090 Cystitis, unspecified without hematuria: Secondary | ICD-10-CM

## 2020-07-04 DIAGNOSIS — R310 Gross hematuria: Secondary | ICD-10-CM

## 2020-07-04 LAB — MICROSCOPIC EXAMINATION: Epithelial Cells (non renal): >10 /hpf — AB (ref 0–10)

## 2020-07-04 LAB — URINALYSIS, COMPLETE
Bilirubin, UA: NEGATIVE
Nitrite, UA: POSITIVE — AB
RBC, UA: NEGATIVE
Specific Gravity, UA: 1.02 (ref 1.005–1.030)
Urobilinogen, Ur: >8 mg/dL — ABNORMAL HIGH (ref 0.2–1.0)
pH, UA: 5.5 (ref 5.0–7.5)

## 2020-07-04 MED ORDER — METRONIDAZOLE 500 MG PO TABS
2000.0000 mg | ORAL_TABLET | Freq: Once | ORAL | 0 refills | Status: AC
Start: 2020-07-04 — End: 2020-07-04

## 2020-07-04 MED ORDER — CEPHALEXIN 250 MG PO CAPS
500.0000 mg | ORAL_CAPSULE | Freq: Once | ORAL | Status: AC
  Administered 2020-07-04: 500 mg via ORAL

## 2020-07-04 NOTE — Patient Instructions (Addendum)
Cystoscopy Cystoscopy is a procedure that is used to help diagnose and sometimes treat conditions that affect the lower urinary tract. The lower urinary tract includes the bladder and the urethra. The urethra is the tube that drains urine from the bladder. Cystoscopy is done using a thin, tube-shaped instrument with a light and camera at the end (cystoscope). The cystoscope may be hard or flexible, depending on the goal of the procedure. The cystoscope is inserted through the urethra, into the bladder. Cystoscopy may be recommended if you have:  Urinary tract infections that keep coming back.  Blood in the urine (hematuria).  An inability to control when you urinate (urinary incontinence) or an overactive bladder.  Unusual cells found in a urine sample.  A blockage in the urethra, such as a urinary stone.  Painful urination.  An abnormality in the bladder found during an intravenous pyelogram (IVP) or CT scan. Cystoscopy may also be done to remove a sample of tissue to be examined under a microscope (biopsy). Tell a health care provider about:  Any allergies you have.  All medicines you are taking, including vitamins, herbs, eye drops, creams, and over-the-counter medicines.  Any problems you or family members have had with anesthetic medicines.  Any blood disorders you have.  Any surgeries you have had.  Any medical conditions you have.  Whether you are pregnant or may be pregnant. What are the risks? Generally, this is a safe procedure. However, problems may occur, including:  Infection.  Bleeding.  Allergic reactions to medicines.  Damage to other structures or organs. What happens before the procedure?  Ask your health care provider about: ? Changing or stopping your regular medicines. This is especially important if you are taking diabetes medicines or blood thinners. ? Taking medicines such as aspirin and ibuprofen. These medicines can thin your blood. Do not take  these medicines unless your health care provider tells you to take them. ? Taking over-the-counter medicines, vitamins, herbs, and supplements.  Follow instructions from your health care provider about eating or drinking restrictions.  Ask your health care provider what steps will be taken to help prevent infection. These may include: ? Washing skin with a germ-killing soap. ? Taking antibiotic medicine.  You may have an exam or testing, such as: ? X-rays of the bladder, urethra, or kidneys. ? Urine tests to check for signs of infection.  Plan to have someone take you home from the hospital or clinic. What happens during the procedure?   You will be given one or more of the following: ? A medicine to help you relax (sedative). ? A medicine to numb the area (local anesthetic).  The area around the opening of your urethra will be cleaned.  The cystoscope will be passed through your urethra into your bladder.  Germ-free (sterile) fluid will flow through the cystoscope to fill your bladder. The fluid will stretch your bladder so that your health care provider can clearly examine your bladder walls.  Your doctor will look at the urethra and bladder. Your doctor may take a biopsy or remove stones.  The cystoscope will be removed, and your bladder will be emptied. The procedure may vary among health care providers and hospitals. What can I expect after the procedure? After the procedure, it is common to have:  Some soreness or pain in your abdomen and urethra.  Urinary symptoms. These include: ? Mild pain or burning when you urinate. Pain should stop within a few minutes after you urinate. This   may last for up to 1 week. ? A small amount of blood in your urine for several days. ? Feeling like you need to urinate but producing only a small amount of urine. Follow these instructions at home: Medicines  Take over-the-counter and prescription medicines only as told by your health care  provider.  If you were prescribed an antibiotic medicine, take it as told by your health care provider. Do not stop taking the antibiotic even if you start to feel better. General instructions  Return to your normal activities as told by your health care provider. Ask your health care provider what activities are safe for you.  Do not drive for 24 hours if you were given a sedative during your procedure.  Watch for any blood in your urine. If the amount of blood in your urine increases, call your health care provider.  Follow instructions from your health care provider about eating or drinking restrictions.  If a tissue sample was removed for testing (biopsy) during your procedure, it is up to you to get your test results. Ask your health care provider, or the department that is doing the test, when your results will be ready.  Drink enough fluid to keep your urine pale yellow.  Keep all follow-up visits as told by your health care provider. This is important. Contact a health care provider if you:  Have pain that gets worse or does not get better with medicine, especially pain when you urinate.  Have trouble urinating.  Have more blood in your urine. Get help right away if you:  Have blood clots in your urine.  Have abdominal pain.  Have a fever or chills.  Are unable to urinate. Summary  Cystoscopy is a procedure that is used to help diagnose and sometimes treat conditions that affect the lower urinary tract.  Cystoscopy is done using a thin, tube-shaped instrument with a light and camera at the end.  After the procedure, it is common to have some soreness or pain in your abdomen and urethra.  Watch for any blood in your urine. If the amount of blood in your urine increases, call your health care provider.  If you were prescribed an antibiotic medicine, take it as told by your health care provider. Do not stop taking the antibiotic even if you start to feel better. This  information is not intended to replace advice given to you by your health care provider. Make sure you discuss any questions you have with your health care provider. Document Revised: 12/05/2018 Document Reviewed: 12/05/2018 Elsevier Patient Education  [place].   Trichomoniasis Trichomoniasis is an STI (sexually transmitted infection) that can affect both women and men. In women, the outer area of the female genitalia (vulva) and the vagina are affected. In men, mainly the penis is affected, but the prostate and other reproductive organs can also be involved.  This condition can be treated with medicine. It often has no symptoms (is asymptomatic), especially in men. If not treated, trichomoniasis can last for months or years. What are the causes? This condition is caused by a parasite called Trichomonas vaginalis. Trichomoniasis most often spreads from person to person (is contagious) through sexual contact. What increases the risk? The following factors may make you more likely to develop this condition:  Having unprotected sex.  Having sex with a partner who has trichomoniasis.  Having multiple sexual partners.  Having had previous trichomoniasis infections or other STIs. What are the signs or symptoms? In  women, symptoms of trichomoniasis include:  Abnormal vaginal discharge that is clear, white, gray, or yellow-green and foamy and has an unusual "fishy" odor.  Itching and irritation of the vagina and vulva.  Burning or pain during urination or sex.  Redness and swelling of the genitals. In men, symptoms of trichomoniasis include:  Penile discharge that may be foamy or contain pus.  Pain in the penis. This may happen only when urinating.  Itching or irritation inside the penis.  Burning after urination or ejaculation. How is this diagnosed? In women, this condition may be found during a routine Pap test or physical exam. It may be found in men during a routine  physical exam. Your health care provider may do tests to help diagnose this infection, such as:  Urine tests (men and women).  The following in women: ? Testing the pH of the vagina. ? A vaginal swab test that checks for the Trichomonas vaginalis parasite. ? Testing vaginal secretions. Your health care provider may test you for other STIs, including HIV (human immunodeficiency virus). How is this treated? This condition is treated with medicine taken by mouth (orally), such as metronidazole or tinidazole, to fight the infection. Your sexual partner(s) also need to be tested and treated.  If you are a woman and you plan to become pregnant or think you may be pregnant, tell your health care provider right away. Some medicines that are used to treat the infection should not be taken during pregnancy. Your health care provider may recommend over-the-counter medicines or creams to help relieve itching or irritation. You may be tested for infection again 3 months after treatment. Follow these instructions at home:  Take and use over-the-counter and prescription medicines, including creams, only as told by your health care provider.  Take your antibiotic medicine as told by your health care provider. Do not stop taking the antibiotic even if you start to feel better.  Do not have sex until 7-10 days after you finish your medicine, or until your health care provider approves. Ask your health care provider when you may start to have sex again.  (Women) Do not douche or wear tampons while you have the infection.  Discuss your infection with your sexual partner(s). Make sure that your partner gets tested and treated, if necessary.  Keep all follow-up visits as told by your health care provider. This is important. How is this prevented?   Use condoms every time you have sex. Using condoms correctly and consistently can help protect against STIs.  Avoid having multiple sexual partners.  Talk  with your sexual partner about any symptoms that either of you may have, as well as any history of STIs.  Get tested for STIs and STDs (sexually transmitted diseases) before you have sex. Ask your partner to do the same.  Do not have sexual contact if you have symptoms of trichomoniasis or another STI. Contact a health care provider if:  You still have symptoms after you finish your medicine.  You develop pain in your abdomen.  You have pain when you urinate.  You have bleeding after sex.  You develop a rash.  You feel nauseous or you vomit.  You plan to become pregnant or think you may be pregnant. Summary  Trichomoniasis is an STI (sexually transmitted infection) that can affect both women and men.  This condition often has no symptoms (is asymptomatic), especially in men.  Without treatment, this condition can last for months or years.  You should  not have sex until 7-10 days after you finish your medicine, or until your health care provider approves. Ask your health care provider when you may start to have sex again.  Discuss your infection with your sexual partner(s). Make sure that your partner gets tested and treated, if necessary. This information is not intended to replace advice given to you by your health care provider. Make sure you discuss any questions you have with your health care provider. Document Revised: 09/26/2018 Document Reviewed: 09/26/2018 Elsevier Patient Education  [place].

## 2020-07-04 NOTE — Progress Notes (Signed)
   07/04/20  CC: No chief complaint on file.   HPI:  F/u - gross hematuria. She was seen in the emergency room 06/21 with hematuria and flank pain.  UA showed rare bacteria, greater than 50 red blood cells per high-powered field.  White count was 5.  She was afebrile with stable vitals.  I did not see that a urine culture was sent. She was on abx. Urine cleared by the time she got to the ED. She had a couple of prior episodes of gross hematuria. She think she had one in 2020. She is an infrequent voider. Good stream. Cr 1.1. She had a roux-en-y bypass. She's lost 50 lbs. Pelvic surgery includes abdominal hysterectomy 3-4 yrs ago.   She has crampy abd pain, radiates to back. No dysuria. F/u office UA 06/21 with 11-30 wbc, few bacteria. No rbcs. Cx was mixed. She was on cephalexin.  She went to emergency department 3 days after I saw her 06/21 with left-sided abdominal pain and back pain.  No further imaging was done.  Her UA continued to clear with no bacteria.  11-20 red cells, 21-50 white cells.  Negative nitrite.  Large leukocyte.  Creatinine was up a bit at 1.34.  White count was 3.6. She was AFVSS. She was recommend to f/u with GI for poss endoscopy given her prior surgery.   She returns for cystoscopy.  No dysuria or gross hematuria, but she has some vaginal pain today.  Trichomonas on UA today.  Last menstrual period 02/19/2016. NED. A&Ox3.   No respiratory distress   Abd soft, NT, ND Normal external genitalia with patent urethral meatus Vulva, meatus and vagina all appear normal.  No lesion.  A thin clear vaginal discharge.  Cystoscopy Procedure Note  Patient identification was confirmed, informed consent was obtained, and patient was prepped using Betadine solution.  Lidocaine jelly was administered per urethral meatus.    Procedure: - Flexible cystoscope introduced, without any difficulty.   - Thorough search of the bladder revealed:    normal urethral meatus    normal  urothelium    no stones    no ulcers     no tumors    no urethral polyps    no trabeculation  - Ureteral orifices were normal in position and appearance.  Post-Procedure: - Patient tolerated the procedure well  Charlott Rakes was chaperone for exam and cystoscopy.  Assessment/ Plan:  Hematuria -benign evaluation.  I will see her back in 6 months to recheck the urine.  UTI - trichomonas on UA. I sent metronidazole 2 g po once. I also advised her to abstain from sex until they and their sex partners are treated (i.e., when therapy has been completed and any symptoms have resolved). I also recommended she follow-up with her PCP or local health department for HIV testing.  She reports she has not been sexually active recently, also she reports an HIV test was negative about 2 months ago.   No follow-ups on file.  Festus Aloe, MD

## 2020-07-22 ENCOUNTER — Other Ambulatory Visit (INDEPENDENT_AMBULATORY_CARE_PROVIDER_SITE_OTHER): Payer: 59

## 2020-07-22 ENCOUNTER — Ambulatory Visit (INDEPENDENT_AMBULATORY_CARE_PROVIDER_SITE_OTHER): Payer: 59 | Admitting: Physician Assistant

## 2020-07-22 ENCOUNTER — Encounter: Payer: Self-pay | Admitting: Physician Assistant

## 2020-07-22 VITALS — BP 120/88 | HR 66 | Ht 70.0 in | Wt 264.0 lb

## 2020-07-22 DIAGNOSIS — Z9884 Bariatric surgery status: Secondary | ICD-10-CM

## 2020-07-22 DIAGNOSIS — Z8639 Personal history of other endocrine, nutritional and metabolic disease: Secondary | ICD-10-CM

## 2020-07-22 DIAGNOSIS — R1013 Epigastric pain: Secondary | ICD-10-CM | POA: Diagnosis not present

## 2020-07-22 DIAGNOSIS — R1310 Dysphagia, unspecified: Secondary | ICD-10-CM

## 2020-07-22 DIAGNOSIS — Z8619 Personal history of other infectious and parasitic diseases: Secondary | ICD-10-CM

## 2020-07-22 LAB — CBC WITH DIFFERENTIAL/PLATELET
Basophils Absolute: 0 10*3/uL (ref 0.0–0.1)
Basophils Relative: 0.9 % (ref 0.0–3.0)
Eosinophils Absolute: 0.2 10*3/uL (ref 0.0–0.7)
Eosinophils Relative: 4.8 % (ref 0.0–5.0)
HCT: 35.8 % — ABNORMAL LOW (ref 36.0–46.0)
Hemoglobin: 11.8 g/dL — ABNORMAL LOW (ref 12.0–15.0)
Lymphocytes Relative: 28.4 % (ref 12.0–46.0)
Lymphs Abs: 1.2 10*3/uL (ref 0.7–4.0)
MCHC: 33 g/dL (ref 30.0–36.0)
MCV: 85.7 fl (ref 78.0–100.0)
Monocytes Absolute: 0.3 10*3/uL (ref 0.1–1.0)
Monocytes Relative: 8.2 % (ref 3.0–12.0)
Neutro Abs: 2.4 10*3/uL (ref 1.4–7.7)
Neutrophils Relative %: 57.7 % (ref 43.0–77.0)
Platelets: 175 10*3/uL (ref 150.0–400.0)
RBC: 4.18 Mil/uL (ref 3.87–5.11)
RDW: 15.1 % (ref 11.5–15.5)
WBC: 4.2 10*3/uL (ref 4.0–10.5)

## 2020-07-22 LAB — COMPREHENSIVE METABOLIC PANEL
ALT: 9 U/L (ref 0–35)
AST: 13 U/L (ref 0–37)
Albumin: 3.8 g/dL (ref 3.5–5.2)
Alkaline Phosphatase: 53 U/L (ref 39–117)
BUN: 7 mg/dL (ref 6–23)
CO2: 30 mEq/L (ref 19–32)
Calcium: 9.1 mg/dL (ref 8.4–10.5)
Chloride: 104 mEq/L (ref 96–112)
Creatinine, Ser: 0.91 mg/dL (ref 0.40–1.20)
GFR: 80.42 mL/min (ref >60.00)
Glucose, Bld: 81 mg/dL (ref 70–99)
Potassium: 3.3 mEq/L — ABNORMAL LOW (ref 3.5–5.1)
Sodium: 140 mEq/L (ref 135–145)
Total Bilirubin: 0.4 mg/dL (ref 0.2–1.2)
Total Protein: 6.9 g/dL (ref 6.0–8.3)

## 2020-07-22 LAB — IRON: Iron: 59 ug/dL (ref 42–145)

## 2020-07-22 LAB — FERRITIN: Ferritin: 20.5 ng/mL (ref 10.0–291.0)

## 2020-07-22 LAB — TRANSFERRIN: Transferrin: 252 mg/dL (ref 212.0–360.0)

## 2020-07-22 MED ORDER — FERROUS SULFATE 75 (15 FE) MG/ML PO SOLN: 60.0000 mg | Freq: Two times a day (BID) | ORAL | 0 refills | Status: DC

## 2020-07-22 NOTE — Progress Notes (Signed)
Subjective:    Patient ID: Victoria Moore, female    DOB: November 17, 1976, 44 y.o.   MRN: [id number]  HPI Victoria Moore is a pleasant 44 year old African-American female, known to Dr. Bryan Lemma.  She comes in today with complaints of 2 to 28-monthhistory of persistent upper abdominal pain.  She describes pain on both of her sides that radiates around into her back and pain in the upper and mid abdomen.  She describes this as a punching pressure type of sensation which is worse with any p.o. intake.  Her appetite has not been good.  She has had nausea without vomiting.  Bowel movements have been normal, no melena or hematochezia. Patient is status [place]oux[place] She had been on chronic PPI therapy prior to her surgery but was taken off at the time of surgery. She had undergone work-up per Dr. CBryan Lemmaprior to surgery which included colonoscopy in September of 2020 with removal of 6 small polyps which were hyperplastic, EGD in October of 2020 which showed a normal esophagus, she was empirically dilated [id number]  She had manometry in September 2020 with finding of a 2.1 cm hiatal hernia, low resting pressures and otherwise negative study.  pH study showed significant GERD with pH less than 418% of the time.  She did undergo CT of the abdomen pelvis on 5 2 2021 which showed postoperative changes and a small amount of free fluid in the pelvis, no gallstones.  She has history of migraines, chronic back pain, hypertension, history of iron deficiency, sarcoidosis, seizure disorder chronic kidney disease and adult onset diabetes mellitu,s and obstructive sleep apnea..  She says she has not had any problems with heartburn or indigestion since the surgery though she has had episodes of regurgitation.  She is also having a sensation as if her food is sticking usually with solids.  She is asking if her gallbladder needs to be removed. No regular aspirin or NSAID  use  Review of Systems Pertinent positive and negative review of systems were noted in the above HPI section.  All other review of systems was otherwise negative.  Outpatient Encounter Medications as of 07/22/2020  Medication Sig  . ACETAMINOPHEN EXTRA STRENGTH 500 MG tablet Take 1,000 mg by mouth every 6 (six) hours as needed for pain.  .Marland Kitchenalbuterol (PROVENTIL) (2.5 MG/3ML) 0.083% nebulizer solution Take 2.5 mg by nebulization every 6 (six) hours as needed for wheezing or shortness of breath.  .Marland Kitchenalbuterol (VENTOLIN HFA) 108 (90 Base) MCG/ACT inhaler Inhale 1-2 puffs into the lungs every 4 (four) hours as needed for wheezing or shortness of breath.  . ALPRAZolam (XANAX) 1 MG tablet Take 0.5-1 tablets (0.5-1 mg total) by mouth at bedtime as needed for anxiety.  .Marland Kitchenamphetamine-dextroamphetamine (ADDERALL XR) 20 MG 24 hr capsule Take 20 mg by mouth as needed.   . chlorthalidone (HYGROTON) 25 MG tablet TAKE 1 TABLET BY MOUTH EVERY DAY (Patient taking differently: Take 25 mg by mouth daily. )  . Fluticasone-Salmeterol (ADVAIR DISKUS) 100-50 MCG/DOSE AEPB Inhale 1 puff into the lungs 2 (two) times daily.  .Marland KitchenGAVILAX 17 GM/SCOOP powder Take 17 g by mouth daily.  .Marland KitchenHYDROcodone-acetaminophen (NORCO/VICODIN) 5-325 MG tablet Take 1 tablet by mouth every 4 (four) hours as needed for moderate pain.  . mometasone-formoterol (DULERA) 100-5 MCG/ACT AERO Inhale 2 puffs into the lungs 2 (two) times daily.  . ondansetron (ZOFRAN) 4 MG tablet Take 4 mg by mouth every 8 (eight)  hours as needed.  . SUMAtriptan (IMITREX) 100 MG tablet TAKE 1 TABLET BY MOUTH 2X DAILY AS NEEDED FOR MIGRAINE. MAY REPEAT IN 2 HOURS IF HEADACHE PERSISTS OR RECURS.  . traMADol (ULTRAM) 50 MG tablet Take 50 mg by mouth every 6 (six) hours as needed for moderate pain.   . [DISCONTINUED] ferrous sulfate 325 (65 FE) MG tablet Take 325 mg by mouth daily with breakfast.  . [DISCONTINUED] promethazine (PHENERGAN) 25 MG tablet Take 25 mg by mouth every  8 (eight) hours as needed.  . ferrous sulfate (FER-IN-SOL) 75 (15 Fe) MG/ML SOLN Take 0.8 mLs (60 mg total) by mouth 2 (two) times daily. With food  . [DISCONTINUED] acetaZOLAMIDE (DIAMOX) 500 MG capsule Take 2 capsules (1,000 mg total) by mouth 2 (two) times daily.  . [DISCONTINUED] cephALEXin (KEFLEX) 500 MG capsule Take 1 capsule (500 mg total) by mouth 4 (four) times daily.  . [DISCONTINUED] citalopram (CELEXA) 20 MG tablet TAKE 1 TABLET BY MOUTH EVERY DAY  . [DISCONTINUED] DULoxetine (CYMBALTA) 30 MG capsule Take 30 mg by mouth daily. (Patient not taking: Reported on 07/22/2020)  . [DISCONTINUED] EMGALITY 120 MG/ML SOAJ Inject into the skin.  . [DISCONTINUED] famotidine (PEPCID) 40 MG tablet Take 40 mg by mouth 2 (two) times daily.  . [DISCONTINUED] nortriptyline (PAMELOR) 25 MG capsule Take 2 capsules at bedtime.  . [DISCONTINUED] pantoprazole (PROTONIX) 40 MG tablet Take 40 mg by mouth daily.  . [DISCONTINUED] phenazopyridine (PYRIDIUM) 200 MG tablet Take 1 tablet (200 mg total) by mouth 3 (three) times daily as needed for pain.  . [DISCONTINUED] potassium chloride SA (KLOR-CON) 20 MEQ tablet Take 1 tablet (20 mEq total) by mouth 2 (two) times daily for 5 days. (Patient not taking: Reported on 06/12/2020)  . [DISCONTINUED] spironolactone (ALDACTONE) 25 MG tablet TAKE 1 TABLET BY MOUTH EVERY DAY  . [DISCONTINUED] sucralfate (CARAFATE) 1 g tablet Take 1 g by mouth 2 (two) times daily.  . [DISCONTINUED] testosterone cypionate (DEPOTESTOSTERONE CYPIONATE) 200 MG/ML injection Inject 200 mg into the muscle every 30 (thirty) days.   Facility-Administered Encounter Medications as of 07/22/2020  Medication  . nitroGLYCERIN (NITROSTAT) SL tablet 0.4 mg   Allergies  Allergen Reactions  . Aspirin Anaphylaxis and Hives        Patient Active Problem List   Diagnosis Date Noted  . Constipation 05/20/2020  . S/P gastric bypass 05/12/2020  . Intractable abdominal pain 04/27/2020  . Benign  intracranial hypertension 02/14/2020  . Myopia of both eyes 02/14/2020  . 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 BMI 45.0-49.9, adult ([place]) 11/29/2019  . Other intervertebral disc degeneration, lumbar region 09/19/2019  . Complex cyst of left ovary 02/01/2019  . Sarcoidosis 02/01/2019  . Leg pain 12/07/2018  . Low libido 11/27/2018  . Prolonged capillary refill time 10/04/2018  . Decreased pedal pulses 10/04/2018  . Spondylosis 09/15/2018  . Chronic low back pain with sciatica 09/15/2018  . Impaired fasting blood sugar 09/11/2018  . Weight gain 09/11/2018  . Mild sleep apnea 08/03/2018  . Hypokalemia 06/28/2018  . Medication side effect 06/22/2018  . Acute low back pain without sciatica 06/22/2018  . Personal history of sarcoidosis 05/26/2018  . Vitamin D deficiency 05/26/2018  . Encounter for health maintenance examination with abnormal findings 05/26/2018  . Depression, major, single episode, moderate ([place]) 05/26/2018  . Daytime sleepiness 05/26/2018  . Chest pain of uncertain etiology 16/09/9603  . GERD (gastroesophageal reflux disease) 05/26/2018  . Essential hypertension, benign 11/15/2016  . Arthritis of ankle  joint 11/10/2016  . Morbid (severe) obesity due to excess calories ([facility]) 10/23/2016  . Common migraine with intractable migraine 10/07/2016  . Dyspnea 04/15/2016  . Hilar adenopathy 04/15/2016  . Seizures ([facility]) 03/11/2016  . Anemia 11/12/2014  . Iron deficiency 11/12/2014   Social History   Socioeconomic History  . Marital status: Single    Spouse name: Not on file  . Number of children: 1  . Years of education: 42  . Highest education level: Not on file  Occupational History  . Occupation: Lawyer  Tobacco Use  . Smoking status: Former Smoker    Packs/day: 0.25    Years: 17.00    Pack years: 4.25    Types: Cigarettes    Quit date: 03/02/2013    Years since quitting: 7.3  . Smokeless tobacco: Never Used  Vaping Use  . Vaping Use: Never used   Substance and Sexual Activity  . Alcohol use: No    Alcohol/week: 0.0 standard drinks  . Drug use: Not Currently    Types: Marijuana    Comment: former - 6 yrs ago  . Sexual activity: Yes    Birth control/protection: Surgical  Other Topics Concern  . Not on file  Social History Narrative   She lives w/ her fiance'. She has one son.    Highest level of education:  12th grade, did not graduate. Currently back in school.   Right-handed   Caffeine: 1 cup of coffee some mornings      Social History      Diet? No       Do you drink/eat things with caffeine? yes      Marital status?           Single                          What year were you married?       Do you live in a house, apartment, assisted living, condo, trailer, etc.? house      Is it one or more stories?     How many persons live in your home?  2      Do you have any pets in your home? (please list)   No       Highest level of education completed? Some college       Current or past profession:       Do you exercise?       Yes                                Type & how often? Daily       Advanced Directives      Do you have a living will? No       Do you have a DNR form?                                  If not, do you want to discuss one? No       Do you have signed POA/HPOA for forms?  No       Functional Status      Do you have difficulty bathing or dressing yourself? No       Do you have difficulty preparing food or eating? No       Do you have difficulty  managing your medications? No       Do you have difficulty managing your finances? No       Do you have difficulty affording your medications? No       Social Determinants of Health   Financial Resource Strain:   . Difficulty of Paying Living Expenses:   Food Insecurity:   . Worried About Charity fundraiser in the Last Year:   . Arboriculturist in the Last Year:   Transportation Needs:   . Film/video editor (Medical):   Marland Kitchen Lack of  Transportation (Non-Medical):   Physical Activity:   . Days of Exercise per Week:   . Minutes of Exercise per Session:   Stress:   . Feeling of Stress :   Social Connections:   . Frequency of Communication with Friends and Family:   . Frequency of Social Gatherings with Friends and Family:   . Attends Religious Services:   . Active Member of Clubs or Organizations:   . Attends Archivist Meetings:   Marland Kitchen Marital Status:   Intimate Partner Violence:   . Fear of Current or Ex-Partner:   . Emotionally Abused:   Marland Kitchen Physically Abused:   . Sexually Abused:     Ms. Gamarra family history includes Asthma in her mother; COPD in her father and mother; Diabetes in her father; Heart Problems in her father and mother; Heart attack in her father and mother; Migraines in her mother; Other in her son; Peptic Ulcer in her father. She was adopted.      Objective:    Vitals:   07/22/20 1440  BP: (!) 120/88  Pulse: 66    Physical Exam Well-developed well-nourished  AA female  in no acute distress.  Height, EYCXKG,818 BMI 37.88  HEENT; nontraumatic normocephalic, EOMI, PER R LA, sclera anicteric. Oropharynx;not done Neck; supple, no JVD Cardiovascular; regular rate and rhythm with S1-S2, no murmur rub or gallop Pulmonary; Clear bilaterally Abdomen; soft, obese , tender in the epigastrium and hypogastrium nondistended, no palpable mass or hepatosplenomegaly, bowel sounds are active Rectal; not done today Skin; benign exam, no jaundice rash or appreciable lesions Extremities; no clubbing cyanosis or edema skin warm and dry Neuro/Psych; alert and oriented x4, grossly nonfocal mood and affect appropriate       Assessment & Plan:   #89 44 year old African-American female 3 months status post Roux-en-Y gastric bypass with persistent postoperative epigastric and hypogastric abdominal pain, which was not present prior to surgery. CT of the abdomen pelvis 5 2 2021 unremarkable.  Etiology  of pain is not clear, will need to rule out marginal ulcer, acute gastropathy, no evidence for cholelithiasis by CT  Patient was positive for H. pylori January 2021 and treated with Pylera-  #2 history of chronic GERD-symptomatically improved post gastric bypass and weight loss #3 intermittent solid food dysphagia-negative EGD October 2020 but empirically dilated with improvement in symptoms, negative manometry. 4.  History of iron deficiency anemia #5 chronic back pain #6 history of sarcoidosis 7.  Chronic kidney disease 8.  Adult onset diabetes mellitus 9.  Seizure disorder #10 History of hyperplastic gastric polyps  Plan; patient will be scheduled for upper endoscopy with Dr. Bryan Lemma.  Procedure was discussed in detail with patient including indications risks and benefits and she is agreeable to proceed.  We will check H. pylori stool antigen. Check CBC with differential, c-Met, repeat iron studies Convert ferrous sulfate to solution form 60 mg / 5 mL p.o. twice  daily for better absorption. If EGD is unrevealing she will need further evaluation with ultrasound and perhaps CC K HIDA scan. She will require COVID-19 testing preprocedure.   Leiloni Smithers S Temitayo Covalt PA-C 07/22/2020   Cc: [name], MD

## 2020-07-22 NOTE — Patient Instructions (Signed)
If you are age 46 or older, your body mass index should be between 23-30. Your Body mass index is 37.88 kg/m. If this is out of the aforementioned range listed, please consider follow up with your Primary Care Provider.  If you are age 27 or younger, your body mass index should be between 19-25. Your Body mass index is 37.88 kg/m. If this is out of the aformentioned range listed, please consider follow up with your Primary Care Provider.   You have been scheduled for an endoscopy. Please follow written instructions given to you at your visit today. If you use inhalers (even only as needed), please bring them with you on the day of your procedure.  Your provider has requested that you go to the basement level for lab work before leaving today. Press "B" on the elevator. The lab is located at the first door on the left as you exit the elevator.  Due to recent changes in healthcare laws, you may see the results of your imaging and laboratory studies on MyChart before your provider has had a chance to review them.  We understand that in some cases there may be results that are confusing or concerning to you. Not all laboratory results come back in the same time frame and the provider may be waiting for multiple results in order to interpret others.  Please give [place] 48 hours in order for your provider to thoroughly review all the results before contacting the office for clarification of your results.   START Ferrous Sulfate solution 60 mg twice a day with food for a month.  Follow up pending the results of your labs and Endoscopy.

## 2020-07-23 ENCOUNTER — Other Ambulatory Visit: Payer: Self-pay

## 2020-07-23 MED ORDER — POTASSIUM CHLORIDE CRYS ER 20 MEQ PO TBCR: 20.0000 meq | EXTENDED_RELEASE_TABLET | Freq: Two times a day (BID) | ORAL | 0 refills | Status: DC

## 2020-07-28 ENCOUNTER — Ambulatory Visit (INDEPENDENT_AMBULATORY_CARE_PROVIDER_SITE_OTHER): Payer: 59

## 2020-07-28 ENCOUNTER — Other Ambulatory Visit: Payer: Self-pay | Admitting: Gastroenterology

## 2020-07-28 ENCOUNTER — Encounter: Payer: Self-pay | Admitting: Gastroenterology

## 2020-07-28 DIAGNOSIS — Z1159 Encounter for screening for other viral diseases: Secondary | ICD-10-CM

## 2020-07-29 ENCOUNTER — Encounter: Payer: Self-pay | Admitting: Certified Registered Nurse Anesthetist

## 2020-07-29 LAB — SARS CORONAVIRUS 2 (TAT 6-24 HRS): SARS Coronavirus 2: NEGATIVE

## 2020-07-30 ENCOUNTER — Other Ambulatory Visit: Payer: Self-pay

## 2020-07-30 ENCOUNTER — Encounter: Payer: Self-pay | Admitting: Gastroenterology

## 2020-07-30 ENCOUNTER — Ambulatory Visit (AMBULATORY_SURGERY_CENTER): Payer: 59 | Admitting: Gastroenterology

## 2020-07-30 VITALS — BP 128/76 | HR 55 | Temp 97.2°F | Resp 16 | Ht 70.0 in | Wt 264.0 lb

## 2020-07-30 DIAGNOSIS — Z9884 Bariatric surgery status: Secondary | ICD-10-CM

## 2020-07-30 DIAGNOSIS — R1310 Dysphagia, unspecified: Secondary | ICD-10-CM

## 2020-07-30 DIAGNOSIS — B9681 Helicobacter pylori [H. pylori] as the cause of diseases classified elsewhere: Secondary | ICD-10-CM

## 2020-07-30 DIAGNOSIS — K2970 Gastritis, unspecified, without bleeding: Secondary | ICD-10-CM | POA: Diagnosis not present

## 2020-07-30 DIAGNOSIS — K2950 Unspecified chronic gastritis without bleeding: Secondary | ICD-10-CM

## 2020-07-30 MED ORDER — SODIUM CHLORIDE 0.9 % IV SOLN: 500.0000 mL | Freq: Once | INTRAVENOUS | Status: DC

## 2020-07-30 MED ORDER — PANTOPRAZOLE SODIUM 40 MG PO TBEC
40.0000 mg | DELAYED_RELEASE_TABLET | Freq: Two times a day (BID) | ORAL | 1 refills | Status: DC
Start: 2020-07-30 — End: 2020-08-14

## 2020-07-30 NOTE — Progress Notes (Signed)
Report given to PACU, vss 

## 2020-07-30 NOTE — Progress Notes (Signed)
VS by CW. ?

## 2020-07-30 NOTE — Patient Instructions (Signed)
Please read handouts provided. Continue present medications. Await pathology results. Protonix 40 mg twice daily for 4 weeks, then reduce to once daily and continue to titrate to lowest effective dose or discontinue completely if no recurrence in symptoms.     YOU HAD AN ENDOSCOPIC PROCEDURE TODAY AT [facility]:   Refer to the procedure report that was given to you for any specific questions about what was found during the examination.  If the procedure report does not answer your questions, please call your gastroenterologist to clarify.  If you requested that your care partner not be given the details of your procedure findings, then the procedure report has been included in a sealed envelope for you to review at your convenience later.  YOU SHOULD EXPECT: Some feelings of bloating in the abdomen. Passage of more gas than usual.  Walking can help get rid of the air that was put into your GI tract during the procedure and reduce the bloating. If you had a lower endoscopy (such as a colonoscopy or flexible sigmoidoscopy) you may notice spotting of blood in your stool or on the toilet paper. If you underwent a bowel prep for your procedure, you may not have a normal bowel movement for a few days.  Please Note:  You might notice some irritation and congestion in your nose or some drainage.  This is from the oxygen used during your procedure.  There is no need for concern and it should clear up in a day or so.  SYMPTOMS TO REPORT IMMEDIATELY:     Following upper endoscopy (EGD)  Vomiting of blood or coffee ground material  New chest pain or pain under the shoulder blades  Painful or persistently difficult swallowing  New shortness of breath  Fever of 100F or higher  Black, tarry-looking stools  For urgent or emergent issues, a gastroenterologist can be reached at any hour by calling [phone]. Do not use MyChart messaging for urgent concerns.    DIET:  We do  recommend a small meal at first, but then you may proceed to your regular diet.  Drink plenty of fluids but you should avoid alcoholic beverages for 24 hours.  ACTIVITY:  You should plan to take it easy for the rest of today and you should NOT DRIVE or use heavy machinery until tomorrow (because of the sedation medicines used during the test).    FOLLOW UP: Our staff will call the number listed on your records 48-72 hours following your procedure to check on you and address any questions or concerns that you may have regarding the information given to you following your procedure. If we do not reach you, we will leave a message.  We will attempt to reach you two times.  During this call, we will ask if you have developed any symptoms of COVID 19. If you develop any symptoms (ie: fever, flu-like symptoms, shortness of breath, cough etc.) before then, please call [phone].  If you test positive for Covid 19 in the 2 weeks post procedure, please call and report this information to [place].    If any biopsies were taken you will be contacted by phone or by letter within the next 1-3 weeks.  Please call us at [phone] if you have not heard about the biopsies in 3 weeks.    SIGNATURES/CONFIDENTIALITY: You and/or your care partner have signed paperwork which will be entered into your electronic medical record.  These signatures attest to the fact that  that the information above on your After Visit Summary has been reviewed and is understood.  Full responsibility of the confidentiality of this discharge information lies with you and/or your care-partner.

## 2020-07-30 NOTE — Progress Notes (Signed)
Robinul 0.1 mg IV given due large amount of secretions upon assessment.  MD made aware, vss 

## 2020-07-30 NOTE — Op Note (Signed)
[facility] Patient Name: Victoria Moore Procedure Date: 07/30/2020 3:59 PM MRN: [id number] Endoscopist: Gerrit Heck , MD Age: 44 Referring MD:  Date of Birth: Feb 06, 1976 Gender: Female Account #: [id number] Procedure:                Upper GI endoscopy Indications:              Epigastric abdominal pain, Upper abdominal pain,                            Dysphagia                           44 yo s/p RYGB on 04/21/20. Has had resolution of                            reflux symptoms since surgery, and off all acid                            suppression therapy. More recently developed upper                            abdominal/epigastric pain, radiating around flanks                            to back. Also with intermittent solid food                            dysphagia, which was previously responsive to                            empiric dilation with 54 Fr Maloney in 09/2019. Medicines:                Monitored Anesthesia Care Procedure:                Pre-Anesthesia Assessment:                           - Prior to the procedure, a History and Physical                            was performed, and patient medications and                            allergies were reviewed. The patient's tolerance of                            previous anesthesia was also reviewed. The risks                            and benefits of the procedure and the sedation                            options and risks were discussed with the patient.  All questions were answered, and informed consent                            was obtained. Prior Anticoagulants: The patient has                            taken no previous anticoagulant or antiplatelet                            agents. ASA Grade Assessment: III - A patient with                            severe systemic disease. After reviewing the risks                            and benefits, the patient was deemed in                             satisfactory condition to undergo the procedure.                           After obtaining informed consent, the endoscope was                            passed under direct vision. Throughout the                            procedure, the patient's blood pressure, pulse, and                            oxygen saturations were monitored continuously. The                            Endoscope was introduced through the mouth, and                            advanced to the jejunum. The upper GI endoscopy was                            accomplished without difficulty. The patient                            tolerated the procedure well. Scope In: Scope Out: Findings:                 The examined esophagus was normal. Empiric dilation                            not performed due to retained food in the stomach.                           The Z-line was regular and was found 40 cm from the  incisors.                           Evidence of a Roux-en-Y gastrojejunostomy was                            found. There was moderate erythema in the gastric                            pouch, without any ulcers or erosions. The                            gastrojejunal anastomosis was characterized by                            healthy appearing mucosa. This was traversed.                            Biopsies were taken from the gastric pouch with a                            cold forceps for Helicobacter pylori testing.                            Estimated blood loss was minimal. There was a                            moderate amount of retained food debris in the                            stomach, which appeared to reflux back and forth                            between the pouch and blind limb. Explored the                            blind limb, which was 5 cm in length. Normal                            appearing mucosa.                           The examined  jejunum was normal. Complications:            No immediate complications. Estimated Blood Loss:     Estimated blood loss was minimal. Impression:               - Normal esophagus.                           - Z-line regular, 40 cm from the incisors.                           - Roux-en-Y gastrojejunostomy with gastrojejunal  anastomosis characterized by healthy appearing                            mucosa. Biopsied.                           - Normal examined jejunum. Recommendation:           - Patient has a contact number available for                            emergencies. The signs and symptoms of potential                            delayed complications were discussed with the                            patient. Return to normal activities tomorrow.                            Written discharge instructions were provided to the                            patient.                           - Resume previous diet.                           - Continue present medications.                           - Await pathology results.                           - Use Protonix (pantoprazole) 40 mg PO BID for 4                            weeks to promote mucosal healing, then reduce to 40                            mg/day and continue to titrate to lowest effective                            dose or discontinue completely if no recurrence in                            symptoms. Gerrit Heck, MD 07/30/2020 4:21:03 PM

## 2020-07-30 NOTE — Progress Notes (Signed)
Called to room to assist during endoscopic procedure.  Patient ID and intended procedure confirmed with present staff. Received instructions for my participation in the procedure from the performing physician.  

## 2020-08-01 ENCOUNTER — Telehealth: Payer: Self-pay | Admitting: *Deleted

## 2020-08-01 NOTE — Telephone Encounter (Signed)
  Follow up Call-  Call back number 07/30/2020 10/11/2019 08/28/2019 08/20/2019 09/26/2018  Post procedure Call Back phone  # [phone]  Permission to leave phone message Yes Yes Yes Yes Yes  Some recent data might be hidden     Patient questions:  Do you have a fever, pain , or abdominal swelling? No. Pain Score  0 *  Have you tolerated food without any problems? Yes.    Have you been able to return to your normal activities? Yes.    Do you have any questions about your discharge instructions: Diet   No. Medications  No. Follow up visit  No.  Do you have questions or concerns about your Care? No.  Actions: * If pain score is 4 or above: No action needed, pain <4.  1. Have you developed a fever since your procedure? no  2.   Have you had an respiratory symptoms (SOB or cough) since your procedure? no  3.   Have you tested positive for COVID 19 since your procedure no  4.   Have you had any family members/close contacts diagnosed with the COVID 19 since your procedure?  no   If yes to any of these questions please route to Joylene John, RN and Erenest Rasher, RN

## 2020-08-12 NOTE — Progress Notes (Signed)
Agree with the assessment and plan as outlined by Amy Esterwood, PA-C.  Obadiah Dennard, DO, FACG  

## 2020-08-13 ENCOUNTER — Other Ambulatory Visit: Payer: Self-pay | Admitting: Neurology

## 2020-08-14 ENCOUNTER — Telehealth: Payer: Self-pay

## 2020-08-14 ENCOUNTER — Other Ambulatory Visit: Payer: Self-pay

## 2020-08-14 DIAGNOSIS — A048 Other specified bacterial intestinal infections: Secondary | ICD-10-CM

## 2020-08-14 MED ORDER — METRONIDAZOLE 250 MG PO TABS
250.0000 mg | ORAL_TABLET | Freq: Four times a day (QID) | ORAL | 0 refills | Status: AC
Start: 2020-08-14 — End: 2020-08-28

## 2020-08-14 MED ORDER — DOXYCYCLINE HYCLATE 100 MG PO CAPS: 100.0000 mg | ORAL_CAPSULE | Freq: Two times a day (BID) | ORAL | 0 refills | Status: AC

## 2020-08-14 MED ORDER — PANTOPRAZOLE SODIUM 40 MG PO TBEC: 40.0000 mg | DELAYED_RELEASE_TABLET | Freq: Every day | ORAL | 1 refills | Status: DC

## 2020-08-14 NOTE — Telephone Encounter (Signed)
Spoke with patient this morning regarding positive H pylori gastritis.  Patient not allergic to the following medications sent to her pharmacy: Metronidazole, Doxycycline.  Patient advised to take medications as prescribed.  Also advised to stop Protonix after 14 days. Needs to purchase Pepto Bismol 262 mg take 2 tabs four times daily for 14 days.  Patient is to have a repeat H pylori stool antigen test on 09/26/20.  Patient verbalized understanding.

## 2020-08-14 NOTE — Progress Notes (Signed)
f °

## 2020-08-15 ENCOUNTER — Other Ambulatory Visit: Payer: Self-pay

## 2020-08-15 DIAGNOSIS — R112 Nausea with vomiting, unspecified: Secondary | ICD-10-CM | POA: Diagnosis not present

## 2020-08-15 DIAGNOSIS — I10 Essential (primary) hypertension: Secondary | ICD-10-CM | POA: Insufficient documentation

## 2020-08-15 DIAGNOSIS — R109 Unspecified abdominal pain: Secondary | ICD-10-CM | POA: Diagnosis present

## 2020-08-15 DIAGNOSIS — Z79899 Other long term (current) drug therapy: Secondary | ICD-10-CM | POA: Diagnosis not present

## 2020-08-15 DIAGNOSIS — E119 Type 2 diabetes mellitus without complications: Secondary | ICD-10-CM | POA: Insufficient documentation

## 2020-08-15 DIAGNOSIS — Z87891 Personal history of nicotine dependence: Secondary | ICD-10-CM | POA: Diagnosis not present

## 2020-08-15 DIAGNOSIS — R519 Headache, unspecified: Secondary | ICD-10-CM | POA: Insufficient documentation

## 2020-08-15 DIAGNOSIS — R5383 Other fatigue: Secondary | ICD-10-CM | POA: Insufficient documentation

## 2020-08-15 DIAGNOSIS — E860 Dehydration: Secondary | ICD-10-CM | POA: Insufficient documentation

## 2020-08-15 DIAGNOSIS — J45909 Unspecified asthma, uncomplicated: Secondary | ICD-10-CM | POA: Diagnosis not present

## 2020-08-15 LAB — COMPREHENSIVE METABOLIC PANEL
ALT: 10 U/L (ref 0–44)
AST: 15 U/L (ref 15–41)
Albumin: 3.9 g/dL (ref 3.5–5.0)
Alkaline Phosphatase: 50 U/L (ref 38–126)
Anion gap: 10 (ref 5–15)
BUN: 10 mg/dL (ref 6–20)
CO2: 27 mmol/L (ref 22–32)
Calcium: 8.8 mg/dL — ABNORMAL LOW (ref 8.9–10.3)
Chloride: 103 mmol/L (ref 98–111)
Creatinine, Ser: 0.94 mg/dL (ref 0.44–1.00)
GFR calc Af Amer: >60 mL/min (ref >60)
GFR calc non Af Amer: >60 mL/min (ref >60)
Glucose, Bld: 92 mg/dL (ref 70–99)
Potassium: 2.9 mmol/L — ABNORMAL LOW (ref 3.5–5.1)
Sodium: 140 mmol/L (ref 135–145)
Total Bilirubin: 0.6 mg/dL (ref 0.3–1.2)
Total Protein: 7 g/dL (ref 6.5–8.1)

## 2020-08-15 LAB — URINALYSIS, COMPLETE (UACMP) WITH MICROSCOPIC
Glucose, UA: NEGATIVE mg/dL
Hgb urine dipstick: NEGATIVE
Ketones, ur: 5 mg/dL — AB
Leukocytes,Ua: NEGATIVE
Nitrite: NEGATIVE
Protein, ur: 30 mg/dL — AB
Specific Gravity, Urine: 1.029 (ref 1.005–1.030)
pH: 5 (ref 5.0–8.0)

## 2020-08-15 LAB — CBC
HCT: 35.1 % — ABNORMAL LOW (ref 36.0–46.0)
Hemoglobin: 11.6 g/dL — ABNORMAL LOW (ref 12.0–15.0)
MCH: 28.2 pg (ref 26.0–34.0)
MCHC: 33 g/dL (ref 30.0–36.0)
MCV: 85.4 fL (ref 80.0–100.0)
Platelets: 174 10*3/uL (ref 150–400)
RBC: 4.11 MIL/uL (ref 3.87–5.11)
RDW: 14 % (ref 11.5–15.5)
WBC: 5 10*3/uL (ref 4.0–10.5)
nRBC: 0 % (ref 0.0–0.2)

## 2020-08-15 NOTE — ED Triage Notes (Signed)
Pt states is a bariatric pt, headache for seven days and generalized abd pain. Pt was told to go to [place] by her MD where she had surgery, but she was not able to get there. Pt states recently diagnosed with h.pylori.

## 2020-08-15 NOTE — ED Notes (Signed)
Pt is a hard stick this tech tried x1 attempt to draw blood and was unsuccessful. IV team to be consulted.

## 2020-08-16 ENCOUNTER — Emergency Department
Admission: EM | Admit: 2020-08-16 | Discharge: 2020-08-16 | Disposition: A | Discharge status: Home / Self Care | Payer: 59 | Attending: Emergency Medicine | Admitting: Emergency Medicine

## 2020-08-16 ENCOUNTER — Emergency Department: Payer: 59

## 2020-08-16 DIAGNOSIS — K2900 Acute gastritis without bleeding: Secondary | ICD-10-CM

## 2020-08-16 DIAGNOSIS — R112 Nausea with vomiting, unspecified: Secondary | ICD-10-CM

## 2020-08-16 MED ORDER — MORPHINE SULFATE (PF) 4 MG/ML IV SOLN
4.0000 mg | Freq: Once | INTRAVENOUS | Status: AC
  Administered 2020-08-16: 4 mg via INTRAVENOUS
  Filled 2020-08-16: qty 1

## 2020-08-16 MED ORDER — IOHEXOL 300 MG/ML  SOLN
125.0000 mL | Freq: Once | INTRAMUSCULAR | Status: AC | PRN
  Administered 2020-08-16: 125 mL via INTRAVENOUS

## 2020-08-16 MED ORDER — HYDROCODONE-ACETAMINOPHEN 5-325 MG PO TABS: 1.0000 | ORAL_TABLET | ORAL | 0 refills | Status: DC | PRN

## 2020-08-16 MED ORDER — POTASSIUM CHLORIDE 20 MEQ/15ML (10%) PO SOLN
40.0000 meq | Freq: Once | ORAL | Status: DC
  Filled 2020-08-16: qty 30

## 2020-08-16 MED ORDER — SODIUM CHLORIDE 0.9 % IV BOLUS
1000.0000 mL | Freq: Once | INTRAVENOUS | Status: AC
  Administered 2020-08-16: 1000 mL via INTRAVENOUS

## 2020-08-16 MED ORDER — DIPHENHYDRAMINE HCL 25 MG PO CAPS
25.0000 mg | ORAL_CAPSULE | Freq: Once | ORAL | Status: AC
  Administered 2020-08-16: 25 mg via ORAL

## 2020-08-16 MED ORDER — ONDANSETRON HCL 4 MG/2ML IJ SOLN
4.0000 mg | Freq: Once | INTRAMUSCULAR | Status: AC
  Administered 2020-08-16: 4 mg via INTRAVENOUS
  Filled 2020-08-16: qty 2

## 2020-08-16 MED ORDER — DIPHENHYDRAMINE HCL 50 MG/ML IJ SOLN
INTRAMUSCULAR | Status: AC
  Administered 2020-08-16: 12.5 mg
  Filled 2020-08-16: qty 1

## 2020-08-16 MED ORDER — PROMETHAZINE HCL 12.5 MG RE SUPP: 12.5000 mg | Freq: Four times a day (QID) | RECTAL | 0 refills | Status: DC | PRN

## 2020-08-16 MED ORDER — IOHEXOL 9 MG/ML PO SOLN
500.0000 mL | Freq: Once | ORAL | Status: DC | PRN
  Administered 2020-08-16: 500 mL via ORAL

## 2020-08-16 MED ORDER — DIPHENHYDRAMINE HCL 25 MG PO CAPS
ORAL_CAPSULE | ORAL | Status: AC
  Filled 2020-08-16: qty 1

## 2020-08-16 NOTE — ED Provider Notes (Signed)
[facility] Emergency Department Provider Note  ____________________________________________   First MD Initiated Contact with Patient 08/16/20 [phone]     (approximate)  I have reviewed the triage vital signs and the nursing notes.   HISTORY  Chief Complaint Abdominal Pain    HPI Victoria Moore is a 44 y.o. 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  Continues to pass gas.  Pain crampy, located in the mid abdomen.  She reports a chronic history of abdominal discomfort especially after her gastric bypass, but started taking antibiotics for helical back.  She reports that seem to make things worse causing her to have multiple episodes of vomiting potentially  No fevers or chills.  No chest pain.  No pain or burning with urination.  Denies pregnancy.   Prior tubal ligation.  No vaginal pain or discharge  She reports feeling dehydrated.  A little bit fatigued.  Mild feeling of headache for about 7 days.  No chest pain.  No shortness of breath.  Called her doctor at [facility], he recommended she come to [facility] [address] but she is unable to get to [facility] last night, feels like she needs to be rehydrated and get her nausea under control.  Reports similar symptoms have happened a couple of times in the past since having her gastric bypass.  Past Medical History:  Diagnosis Date  . Acute low back pain without sciatica 06/22/2018  . ADHD   . Allergy   . Anemia   . Anxiety   . Arthritis   . 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 Fibromyalgia, doubt sarcoid   . Asthma   . Blood transfusion without reported diagnosis   . Chest wall pain 05/26/2018  . Chronic kidney disease   . Chronic low back  pain with sciatica 09/15/2018  . Common migraine with intractable migraine 10/07/2016  . Cough variant asthma vs UACS  10/23/2016   - Spirometry 04/15/2016  Very truncated exp loop effort dep portion only  - Allergy profile 10/22/2016 >  Eos 0.2/  IgE  52 RAST POS grass/trees/ragweed  10/22/2016  After extensive coaching HFA effectiveness =    75% try duelra 100 2bid > improved  . Daytime sleepiness 05/26/2018  . Decreased pedal pulses 10/04/2018  . Depression   . Depression, major, single episode, moderate ([place]) 05/26/2018  . Diabetes ([place])   . Dyspnea 04/15/2016   04/15/2016  Walked RA x 3 laps @ 185 ft each stopped due to  End of study, nl pace, no sob or desat    - Spirometry 04/15/2016  Very truncated exp loop effort dep portion only  - 10/22/2016  Walked RA x 3 laps @ 185 ft each stopped due to  End of study, slow pace, min sob/ no desat - full pfts rec 10/22/2016 >>>      . Edema   . Essential hypertension, benign 11/15/2016  . Fibroids 03/11/2016  . Frequency of urination 09/11/2018  . GERD (gastroesophageal reflux disease)   . Herniated lumbar intervertebral disc   . Hiatal hernia   . Hilar adenopathy   . Hypertension   . Hypokalemia 06/28/2018  . Impaired fasting blood sugar 09/11/2018  . Iron deficiency   . Leg  pain 12/07/2018  . Low libido 11/27/2018  . Migraine   . Mild sleep apnea 08/03/2018   HST 06/20/18  AHI  8.1 / snoring with 02 nadir 80% >  08/03/2018 rec sleep medicine consultation   . Morbid (severe) obesity due to excess calories ([place]) 10/23/2016  . Personal history of sarcoidosis 05/26/2018  . Prolonged capillary refill time   . Right leg swelling 08/18/2018  . Sarcoidosis    personal history of  . Seizures ([place])    history of   . Sleep apnea   . Spondylosis   . Vitamin D deficiency     Patient Active Problem List   Diagnosis Date Noted  . Constipation 05/20/2020  . S/P gastric bypass 05/12/2020  . Intractable abdominal pain 04/27/2020  . Benign intracranial  hypertension 02/14/2020  . Myopia of both eyes 02/14/2020  . 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 BMI 45.0-49.9, adult ([place]) 11/29/2019  . Other intervertebral disc degeneration, lumbar region 09/19/2019  . Complex cyst of left ovary 02/01/2019  . Sarcoidosis 02/01/2019  . Leg pain 12/07/2018  . Low libido 11/27/2018  . Prolonged capillary refill time 10/04/2018  . Decreased pedal pulses 10/04/2018  . Spondylosis 09/15/2018  . Chronic low back pain with sciatica 09/15/2018  . Impaired fasting blood sugar 09/11/2018  . Weight gain 09/11/2018  . Mild sleep apnea 08/03/2018  . Hypokalemia 06/28/2018  . Medication side effect 06/22/2018  . Acute low back pain without sciatica 06/22/2018  . Personal history of sarcoidosis 05/26/2018  . Vitamin D deficiency 05/26/2018  . Encounter for health maintenance examination with abnormal findings 05/26/2018  . Depression, major, single episode, moderate ([place]) 05/26/2018  . Daytime sleepiness 05/26/2018  . Chest pain of uncertain etiology 32/20/2542  . GERD (gastroesophageal reflux disease) 05/26/2018  . Essential hypertension, benign 11/15/2016  . 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 Morbid (severe) obesity due to excess calories ([place]) 10/23/2016  . Common migraine with intractable migraine 10/07/2016  . Dyspnea 04/15/2016  . Hilar adenopathy 04/15/2016  . Seizures ([place]) 03/11/2016  . Anemia 11/12/2014  . Iron deficiency 11/12/2014    Past Surgical History:  Procedure Laterality Date  . ABDOMINAL HYSTERECTOMY    . ESOPHAGEAL MANOMETRY N/A 09/05/2019   Procedure: ESOPHAGEAL MANOMETRY (EM);  Surgeon: Lavena Bullion, DO;  Location: [facility];  Service: Gastroenterology;  Laterality: N/A;  . LAPAROSCOPIC OVARIAN CYSTECTOMY Left 03/11/2016   Procedure: LAPAROSCOPIC OVARIAN CYSTECTOMY;  Surgeon: Eldred Manges, MD;  Location: [facility];  Service: Gynecology;  Laterality: Left;  . LAPAROSCOPIC VAGINAL HYSTERECTOMY WITH SALPINGECTOMY  Bilateral 03/11/2016   Procedure: LAPAROSCOPIC ASSISTED VAGINAL HYSTERECTOMY WITH SALPINGECTOMY;  Surgeon: Eldred Manges, MD;  Location: [facility];  Service: Gynecology;  Laterality: Bilateral;  . [place] IMPEDANCE STUDY  09/05/2019   Procedure: [place] IMPEDANCE STUDY;  Surgeon: Lavena Bullion, DO;  Location: [facility];  Service: Gastroenterology;;  . TUBAL LIGATION    . UPPER GASTROINTESTINAL ENDOSCOPY  10/11/2019   06/2019    Prior to Admission medications   Medication Sig Start Date End Date Taking? Authorizing Provider  ACETAMINOPHEN EXTRA STRENGTH 500 MG tablet Take 1,000 mg by mouth every 6 (six) hours as needed for pain. 04/08/20   [provider]  albuterol (PROVENTIL) (2.5 MG/3ML) 0.083% nebulizer solution Take 2.5 mg by nebulization every 6 (six) hours as needed for wheezing or shortness of breath.    [provider]  albuterol (VENTOLIN HFA) 108 (90 Base) MCG/ACT inhaler Inhale 1-2 puffs into  the lungs every 4 (four) hours as needed for wheezing or shortness of breath. 01/14/20   Parrett, Fonnie Mu, NP  ALPRAZolam Duanne Moron) 1 MG tablet Take 0.5-1 tablets (0.5-1 mg total) by mouth at bedtime as needed for anxiety. 12/05/19   Ngetich, Dinah C, NP  amphetamine-dextroamphetamine (ADDERALL XR) 20 MG 24 hr capsule Take 20 mg by mouth as needed.  04/08/20   [provider]  chlorthalidone (HYGROTON) 25 MG tablet TAKE 1 TABLET BY MOUTH EVERY DAY Patient taking differently: Take 25 mg by mouth daily.  11/05/19   Ngetich, Dinah C, NP  doxycycline (VIBRAMYCIN) 100 MG capsule Take 1 capsule (100 mg total) by mouth 2 (two) times daily for 14 days. 08/14/20 08/28/20  Cirigliano, Vito V, DO  ferrous sulfate (FER-IN-SOL) 75 (15 Fe) MG/ML SOLN Take 0.8 mLs (60 mg total) by mouth 2 (two) times daily. With food 07/22/20 08/21/20  Esterwood, Amy S, PA-C  Fluticasone-Salmeterol (ADVAIR DISKUS) 100-50 MCG/DOSE AEPB Inhale 1 puff into the lungs 2 (two) times daily. 11/20/19   Parrett, Fonnie Mu, NP    GAVILAX 17 GM/SCOOP powder Take 17 g by mouth daily. 05/20/20   [provider]  HYDROcodone-acetaminophen (NORCO/VICODIN) 5-325 MG tablet Take 1 tablet by mouth every 4 (four) hours as needed for moderate pain. 06/16/20 06/16/21  Harvest Dark, MD  metFORMIN (GLUCOPHAGE) 500 MG tablet Take 500 mg by mouth 2 (two) times daily with a meal.    [provider]  metroNIDAZOLE (FLAGYL) 250 MG tablet Take 1 tablet (250 mg total) by mouth 4 (four) times daily for 14 days. 08/14/20 08/28/20  Cirigliano, Vito V, DO  mometasone-formoterol (DULERA) 100-5 MCG/ACT AERO Inhale 2 puffs into the lungs 2 (two) times daily. 08/10/19   Ngetich, Dinah C, NP  ondansetron (ZOFRAN) 4 MG tablet Take 4 mg by mouth every 8 (eight) hours as needed. 05/13/20   [provider]  pantoprazole (PROTONIX) 40 MG tablet Take 1 tablet (40 mg total) by mouth daily. Okay to stop after 14 days.  Must be off two weeks prior to repeat labs. 08/14/20   Cirigliano, Vito V, DO  potassium chloride SA (KLOR-CON) 20 MEQ tablet Take 1 tablet (20 mEq total) by mouth 2 (two) times daily. 07/23/20   Esterwood, Amy S, PA-C  SUMAtriptan (IMITREX) 100 MG tablet TAKE 1 TABLET BY MOUTH 2X DAILY AS NEEDED FOR MIGRAINE. MAY REPEAT IN 2 HOURS IF HEADACHE PERSISTS OR RECURS. 12/11/19   Kathrynn Ducking, MD  traMADol (ULTRAM) 50 MG tablet Take 50 mg by mouth every 6 (six) hours as needed for moderate pain.  03/27/20   [provider]    Allergies Aspirin  Family History  Adopted: Yes  Problem Relation Age of Onset  . Other Son        Growing pains  . Asthma Mother   . Heart Problems Mother   . Migraines Mother   . COPD Mother   . Heart attack Mother   . Diabetes Father   . Peptic Ulcer Father   . Heart Problems Father   . COPD Father   . Heart attack Father   . Colon cancer Neg Hx   . Colon polyps Neg Hx   . Esophageal cancer Neg Hx   . Rectal cancer Neg Hx   . Stomach cancer Neg Hx     Social  History Social History   Tobacco Use  . Smoking status: Former Smoker    Packs/day: 0.25    Years: 17.00  Pack years: 4.25    Types: Cigarettes    Quit date: 03/02/2013    Years since quitting: 7.4  . Smokeless tobacco: Never Used  Vaping Use  . Vaping Use: Never used  Substance Use Topics  . Alcohol use: No    Alcohol/week: 0.0 standard drinks  . Drug use: Not Currently    Types: Marijuana    Comment: former - 6 yrs ago    Review of Systems Constitutional: No fever/chills but feels fatigued Eyes: No visual changes. ENT: No sore throat. Cardiovascular: Denies chest pain. Respiratory: Denies shortness of breath. Gastrointestinal: See HPI Genitourinary: Negative for dysuria. Musculoskeletal: Negative for back pain. Skin: Negative for rash. Neurological: Negative for headaches, areas of focal weakness or numbness.    ____________________________________________   PHYSICAL EXAM:  VITAL SIGNS: ED Triage Vitals  Enc Vitals Group     BP 08/15/20 2040 120/68     Pulse Rate 08/15/20 2040 60     Resp 08/15/20 2040 14     Temp 08/15/20 2040 98 F (36.7 C)     Temp Source 08/15/20 2040 Oral     SpO2 08/15/20 2040 100 %     Weight 08/15/20 2041 250 lb (113.4 kg)     Height 08/15/20 2041 5\' 10"  (1.778 m)     Head Circumference --      Peak Flow --      Pain Score 08/15/20 2041 10     Pain Loc --      Pain Edu? --      Excl. in [place]? --     Constitutional: Alert and oriented. Well appearing and in no acute distress.  She is in no acute distress.  Not actively vomiting Eyes: Conjunctivae are normal. Head: Atraumatic. Nose: No congestion/rhinnorhea. Mouth/Throat: Mucous membranes are moist. Neck: No stridor.  Cardiovascular: Normal rate, regular rhythm. Grossly normal heart sounds.  Good peripheral circulation. Respiratory: Normal respiratory effort.  No retractions. Lungs CTAB. Gastrointestinal: Soft and somewhat obese, surgical sites old and intact.  Reports mild  tenderness throughout, but somewhat more so in the periumbilical region without evidence of hernia.  No distention. Musculoskeletal: No lower extremity tenderness nor edema. Neurologic:  Normal speech and language. No gross focal neurologic deficits are appreciated.  Skin:  Skin is warm, dry and intact. No rash noted. Psychiatric: Mood and affect are normal. Speech and behavior are normal.  ____________________________________________   LABS (all labs ordered are listed, but only abnormal results are displayed)  Labs Reviewed  COMPREHENSIVE METABOLIC PANEL - Abnormal; Notable for the following components:      Result Value   Potassium 2.9 (*)    Calcium 8.8 (*)    All other components within normal limits  CBC - Abnormal; Notable for the following components:   Hemoglobin 11.6 (*)    HCT 35.1 (*)    All other components within normal limits  URINALYSIS, COMPLETE (UACMP) WITH MICROSCOPIC - Abnormal; Notable for the following components:   Color, Urine AMBER (*)    APPearance CLOUDY (*)    Bilirubin Urine SMALL (*)    Ketones, ur 5 (*)    Protein, ur 30 (*)    Bacteria, UA RARE (*)    All other components within normal limits  LIPASE, BLOOD   ____________________________________________  EKG   ____________________________________________  RADIOLOGY  CT abdomen pelvis pending at time of signout to Dr. Corky Downs ____________________________________________   PROCEDURES  Procedure(s) performed: None  Procedures  Critical Care performed: No  ____________________________________________  INITIAL IMPRESSION / ASSESSMENT AND PLAN / ED COURSE  Pertinent labs & imaging results that were available during my care of the patient were reviewed by me and considered in my medical decision making (see chart for details).   Differential diagnosis includes but is not limited to, abdominal perforation, aortic dissection, cholecystitis, appendicitis, diverticulitis, colitis,  esophagitis/gastritis, kidney stone, pyelonephritis, urinary tract infection, aortic aneurysm. All are considered in decision and treatment plan. Based upon the patient's presentation and risk factors, and given her previous history gastric bypass we will obtain CT imaging to further evaluate including rule out obstruction.  Given her clinical history and recent endoscopy which I reviewed, I think the likelihood of ulcerative disease, perforation, or obstruction does seem low but given her gastric bypass we will proceed with imaging.  Urine sent for culture, denies urinary symptoms and her urine sample does appear to be somewhat contaminated.  No associated cardiac or pulmonary symptoms.  Will rehydrate, provide antiemetics, pain relief and reassessment thereafter  ----------------------------------------- 7:36 AM on 08/16/2020 -----------------------------------------  Ongoing care assigned to Dr. Corky Downs        ____________________________________________   FINAL CLINICAL IMPRESSION(S) / ED DIAGNOSES  Final diagnoses:  Non-intractable vomiting with nausea, unspecified vomiting type        Note:  This document was prepared using Dragon voice recognition software and may include unintentional dictation errors       Delman Kitten, MD 08/16/20 [phone]

## 2020-08-16 NOTE — ED Provider Notes (Signed)
CT scan is unremarkable. Will rx phenergan suppositories to help with nausea so patient can tolerate triple therapy abx. Close f/u with GI   Lavonia Drafts, MD 08/16/20 1109

## 2020-09-15 ENCOUNTER — Other Ambulatory Visit: Payer: Self-pay

## 2020-09-15 ENCOUNTER — Emergency Department
Admission: EM | Admit: 2020-09-15 | Discharge: 2020-09-15 | Disposition: A | Discharge status: Home / Self Care | Payer: 59 | Attending: Emergency Medicine | Admitting: Emergency Medicine

## 2020-09-15 ENCOUNTER — Encounter: Payer: Self-pay | Admitting: Emergency Medicine

## 2020-09-15 DIAGNOSIS — Z7984 Long term (current) use of oral hypoglycemic drugs: Secondary | ICD-10-CM | POA: Insufficient documentation

## 2020-09-15 DIAGNOSIS — Z87891 Personal history of nicotine dependence: Secondary | ICD-10-CM | POA: Diagnosis not present

## 2020-09-15 DIAGNOSIS — K2900 Acute gastritis without bleeding: Secondary | ICD-10-CM | POA: Diagnosis not present

## 2020-09-15 DIAGNOSIS — Z79899 Other long term (current) drug therapy: Secondary | ICD-10-CM | POA: Insufficient documentation

## 2020-09-15 DIAGNOSIS — I10 Essential (primary) hypertension: Secondary | ICD-10-CM | POA: Insufficient documentation

## 2020-09-15 DIAGNOSIS — K219 Gastro-esophageal reflux disease without esophagitis: Secondary | ICD-10-CM | POA: Insufficient documentation

## 2020-09-15 DIAGNOSIS — R1013 Epigastric pain: Secondary | ICD-10-CM | POA: Diagnosis present

## 2020-09-15 DIAGNOSIS — E119 Type 2 diabetes mellitus without complications: Secondary | ICD-10-CM | POA: Insufficient documentation

## 2020-09-15 LAB — URINALYSIS, COMPLETE (UACMP) WITH MICROSCOPIC
Bilirubin Urine: NEGATIVE
Glucose, UA: NEGATIVE mg/dL
Hgb urine dipstick: NEGATIVE
Ketones, ur: 5 mg/dL — AB
Leukocytes,Ua: NEGATIVE
Nitrite: NEGATIVE
Protein, ur: NEGATIVE mg/dL
Specific Gravity, Urine: 1.021 (ref 1.005–1.030)
pH: 5 (ref 5.0–8.0)

## 2020-09-15 LAB — CBC
HCT: 36 % (ref 36.0–46.0)
Hemoglobin: 11.9 g/dL — ABNORMAL LOW (ref 12.0–15.0)
MCH: 28.4 pg (ref 26.0–34.0)
MCHC: 33.1 g/dL (ref 30.0–36.0)
MCV: 85.9 fL (ref 80.0–100.0)
Platelets: 168 10*3/uL (ref 150–400)
RBC: 4.19 MIL/uL (ref 3.87–5.11)
RDW: 13.8 % (ref 11.5–15.5)
WBC: 3.7 10*3/uL — ABNORMAL LOW (ref 4.0–10.5)
nRBC: 0 % (ref 0.0–0.2)

## 2020-09-15 LAB — COMPREHENSIVE METABOLIC PANEL
ALT: 11 U/L (ref 0–44)
AST: 16 U/L (ref 15–41)
Albumin: 3.6 g/dL (ref 3.5–5.0)
Alkaline Phosphatase: 50 U/L (ref 38–126)
Anion gap: 7 (ref 5–15)
BUN: 9 mg/dL (ref 6–20)
CO2: 28 mmol/L (ref 22–32)
Calcium: 8.6 mg/dL — ABNORMAL LOW (ref 8.9–10.3)
Chloride: 103 mmol/L (ref 98–111)
Creatinine, Ser: 0.87 mg/dL (ref 0.44–1.00)
GFR calc Af Amer: >60 mL/min (ref >60)
GFR calc non Af Amer: >60 mL/min (ref >60)
Glucose, Bld: 91 mg/dL (ref 70–99)
Potassium: 3.1 mmol/L — ABNORMAL LOW (ref 3.5–5.1)
Sodium: 138 mmol/L (ref 135–145)
Total Bilirubin: 0.5 mg/dL (ref 0.3–1.2)
Total Protein: 6.8 g/dL (ref 6.5–8.1)

## 2020-09-15 LAB — LIPASE, BLOOD: Lipase: 26 U/L (ref 11–51)

## 2020-09-15 LAB — POCT PREGNANCY, URINE: Preg Test, Ur: NEGATIVE

## 2020-09-15 MED ORDER — PANTOPRAZOLE SODIUM 40 MG PO TBEC: 40.0000 mg | DELAYED_RELEASE_TABLET | Freq: Two times a day (BID) | ORAL | 0 refills | Status: DC

## 2020-09-15 MED ORDER — ONDANSETRON HCL 4 MG/2ML IJ SOLN
4.0000 mg | Freq: Once | INTRAMUSCULAR | Status: DC
  Filled 2020-09-15: qty 2

## 2020-09-15 MED ORDER — METRONIDAZOLE 500 MG PO TABS: 500.0000 mg | ORAL_TABLET | Freq: Two times a day (BID) | ORAL | 0 refills | Status: AC

## 2020-09-15 MED ORDER — MORPHINE SULFATE (PF) 4 MG/ML IV SOLN
4.0000 mg | Freq: Once | INTRAVENOUS | Status: AC
  Administered 2020-09-15: 4 mg via INTRAMUSCULAR

## 2020-09-15 MED ORDER — CLARITHROMYCIN 500 MG PO TABS: 500.0000 mg | ORAL_TABLET | Freq: Two times a day (BID) | ORAL | 0 refills | Status: AC

## 2020-09-15 MED ORDER — AMOXICILLIN 500 MG PO CAPS: 1000.0000 mg | ORAL_CAPSULE | Freq: Two times a day (BID) | ORAL | 0 refills | Status: AC

## 2020-09-15 MED ORDER — SODIUM CHLORIDE 0.9 % IV SOLN: 1000.0000 mL | Freq: Once | INTRAVENOUS | Status: DC

## 2020-09-15 MED ORDER — ONDANSETRON 4 MG PO TBDP
4.0000 mg | ORAL_TABLET | Freq: Once | ORAL | Status: AC
  Administered 2020-09-15: 4 mg via ORAL
  Filled 2020-09-15: qty 1

## 2020-09-15 MED ORDER — MORPHINE SULFATE (PF) 4 MG/ML IV SOLN
4.0000 mg | Freq: Once | INTRAVENOUS | Status: DC
  Filled 2020-09-15: qty 1

## 2020-09-15 NOTE — ED Triage Notes (Signed)
Presents with some abd pain and swelling  No n/v  But feels dizzy  Recently dx'd with h.pylori

## 2020-09-15 NOTE — ED Notes (Signed)
Peripheral IV attempt x2

## 2020-09-15 NOTE — ED Provider Notes (Signed)
[facility] Emergency Department Provider Note   ____________________________________________    I have reviewed the triage vital signs and the nursing notes.   HISTORY  Chief Complaint Abdominal Pain and Dizziness     HPI Victoria Moore is a 44 y.o. female with extensive past medical history as detailed below including diabetes, gastric bypass, H. pylori presents with complaints of epigastric pain.  Patient reports she was recently admitted to [facility] where she had an EGD, biopsy demonstrated H. pylori, she was treated on triple therapy x14 days with improvement however over the last several days she has developed burning discomfort in her epigastrium particularly on the right and left upper quadrants as well.  No significant nausea is tolerating p.o.'s.  No fevers or chills.  Is continuing to take her PPI  Past Medical History:  Diagnosis Date  . Acute low back pain without sciatica 06/22/2018  . ADHD   . Allergy   . Anemia   . Anxiety   . Arthritis   . 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 Fibromyalgia, doubt sarcoid   . Asthma   . Blood transfusion without reported diagnosis   . Chest wall pain 05/26/2018  . Chronic kidney disease   . Chronic low back pain with sciatica 09/15/2018  . Common migraine with intractable migraine 10/07/2016  . Cough variant asthma vs UACS  10/23/2016   - Spirometry 04/15/2016  Very truncated exp loop effort dep portion only  - Allergy profile 10/22/2016 >  Eos 0.2/  IgE  52 RAST POS grass/trees/ragweed  10/22/2016  After extensive coaching HFA effectiveness =    75% try duelra 100 2bid > improved  . Daytime sleepiness 05/26/2018  . Decreased pedal pulses 10/04/2018  . Depression   . Depression, major, single episode, moderate ([place]) 05/26/2018  . Diabetes ([place])   . Dyspnea 04/15/2016   04/15/2016  Walked RA x 3 laps @ 185 ft each stopped due to  End of study, nl  pace, no sob or desat    - Spirometry 04/15/2016  Very truncated exp loop effort dep portion only  - 10/22/2016  Walked RA x 3 laps @ 185 ft each stopped due to  End of study, slow pace, min sob/ no desat - full pfts rec 10/22/2016 >>>      . Edema   . Essential hypertension, benign 11/15/2016  . Fibroids 03/11/2016  . Frequency of urination 09/11/2018  . GERD (gastroesophageal reflux disease)   . Herniated lumbar intervertebral disc   . Hiatal hernia   . Hilar adenopathy   . Hypertension   . Hypokalemia 06/28/2018  . Impaired fasting blood sugar 09/11/2018  . Iron deficiency   . Leg pain 12/07/2018  . Low libido 11/27/2018  . Migraine   . Mild sleep apnea 08/03/2018   HST 06/20/18  AHI  8.1 / snoring with 02 nadir 80% >  08/03/2018 rec sleep medicine consultation   . Morbid (severe) obesity due to excess calories ([place]) 10/23/2016  . Personal history of sarcoidosis 05/26/2018  . Prolonged capillary refill time   . Right leg swelling 08/18/2018  . Sarcoidosis    personal history of  . Seizures ([place])    history of   . Sleep apnea   . Spondylosis   . Vitamin D deficiency     Patient Active Problem List   Diagnosis Date Noted  . Constipation 05/20/2020  .  S/P gastric bypass 05/12/2020  . Intractable abdominal pain 04/27/2020  . Benign intracranial hypertension 02/14/2020  . Myopia of both eyes 02/14/2020  . 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 BMI 45.0-49.9, adult ([place]) 11/29/2019  . Other intervertebral disc degeneration, lumbar region 09/19/2019  . Complex cyst of left ovary 02/01/2019  . Sarcoidosis 02/01/2019  . Leg pain 12/07/2018  . Low libido 11/27/2018  . Prolonged capillary refill time 10/04/2018  . Decreased pedal pulses 10/04/2018  . Spondylosis 09/15/2018  . Chronic low back pain with sciatica 09/15/2018  . Impaired fasting blood sugar 09/11/2018  . Weight gain 09/11/2018  . Mild sleep apnea 08/03/2018  . Hypokalemia 06/28/2018  . Medication side effect 06/22/2018    . Acute low back pain without sciatica 06/22/2018  . Personal history of sarcoidosis 05/26/2018  . Vitamin D deficiency 05/26/2018  . Encounter for health maintenance examination with abnormal findings 05/26/2018  . Depression, major, single episode, moderate ([place]) 05/26/2018  . Daytime sleepiness 05/26/2018  . Chest pain of uncertain etiology 29/52/8413  . GERD (gastroesophageal reflux disease) 05/26/2018  . Essential hypertension, benign 11/15/2016  . 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 Morbid (severe) obesity due to excess calories ([place]) 10/23/2016  . Common migraine with intractable migraine 10/07/2016  . Dyspnea 04/15/2016  . Hilar adenopathy 04/15/2016  . Seizures ([place]) 03/11/2016  . Anemia 11/12/2014  . Iron deficiency 11/12/2014    Past Surgical History:  Procedure Laterality Date  . ABDOMINAL HYSTERECTOMY    . ESOPHAGEAL MANOMETRY N/A 09/05/2019   Procedure: ESOPHAGEAL MANOMETRY (EM);  Surgeon: Lavena Bullion, DO;  Location: [facility];  Service: Gastroenterology;  Laterality: N/A;  . LAPAROSCOPIC OVARIAN CYSTECTOMY Left 03/11/2016   Procedure: LAPAROSCOPIC OVARIAN CYSTECTOMY;  Surgeon: Eldred Manges, MD;  Location: [facility];  Service: Gynecology;  Laterality: Left;  . LAPAROSCOPIC VAGINAL HYSTERECTOMY WITH SALPINGECTOMY Bilateral 03/11/2016   Procedure: LAPAROSCOPIC ASSISTED VAGINAL HYSTERECTOMY WITH SALPINGECTOMY;  Surgeon: Eldred Manges, MD;  Location: [facility];  Service: Gynecology;  Laterality: Bilateral;  . [place] IMPEDANCE STUDY  09/05/2019   Procedure: [place] IMPEDANCE STUDY;  Surgeon: Lavena Bullion, DO;  Location: [facility];  Service: Gastroenterology;;  . TUBAL LIGATION    . UPPER GASTROINTESTINAL ENDOSCOPY  10/11/2019   06/2019    Prior to Admission medications   Medication Sig Start Date End Date Taking? Authorizing Provider  ACETAMINOPHEN EXTRA STRENGTH 500 MG tablet Take 1,000 mg by mouth every 6 (six) hours as needed for pain. 04/08/20   [provider]  albuterol (PROVENTIL) (2.5 MG/3ML) 0.083% nebulizer solution Take 2.5 mg by nebulization every 6 (six) hours as needed for wheezing or shortness of breath.    [provider]  albuterol (VENTOLIN HFA) 108 (90 Base) MCG/ACT inhaler Inhale 1-2 puffs into the lungs every 4 (four) hours as needed for wheezing or shortness of breath. 01/14/20   Parrett, Fonnie Mu, NP  ALPRAZolam Duanne Moron) 1 MG tablet Take 0.5-1 tablets (0.5-1 mg total) by mouth at bedtime as needed for anxiety. 12/05/19   Ngetich, Dinah C, NP  amoxicillin (AMOXIL) 500 MG capsule Take 2 capsules (1,000 mg total) by mouth 2 (two) times daily for 14 days. 09/15/20 09/29/20  Lavonia Drafts, MD  amphetamine-dextroamphetamine (ADDERALL XR) 20 MG 24 hr capsule Take 20 mg by mouth as needed.  04/08/20   [provider]  chlorthalidone (HYGROTON) 25 MG tablet TAKE 1 TABLET BY MOUTH EVERY DAY Patient taking differently: Take 25 mg by mouth daily.  11/05/19   Ngetich, Dinah C, NP  clarithromycin (BIAXIN) 500 MG tablet Take 1 tablet (500 mg total) by mouth 2 (two) times daily for 14 days. 09/15/20 09/29/20  Lavonia Drafts, MD  ferrous sulfate (FER-IN-SOL) 75 (15 Fe) MG/ML SOLN Take 0.8 mLs (60 mg total) by mouth 2 (two) times daily. With food 07/22/20 08/21/20  Esterwood, Amy S, PA-C  Fluticasone-Salmeterol (ADVAIR DISKUS) 100-50 MCG/DOSE AEPB Inhale 1 puff into the lungs 2 (two) times daily. 11/20/19   Parrett, Fonnie Mu, NP  GAVILAX 17 GM/SCOOP powder Take 17 g by mouth daily. 05/20/20   [provider]  HYDROcodone-acetaminophen (NORCO/VICODIN) 5-325 MG tablet Take 1 tablet by mouth every 4 (four) hours as needed for moderate pain. 08/16/20   Lavonia Drafts, MD  metFORMIN (GLUCOPHAGE) 500 MG tablet Take 500 mg by mouth 2 (two) times daily with a meal.    [provider]  metroNIDAZOLE (FLAGYL) 500 MG tablet Take 1 tablet (500 mg total) by mouth 2 (two) times daily for 14 days. 09/15/20 09/29/20  Lavonia Drafts, MD    mometasone-formoterol (DULERA) 100-5 MCG/ACT AERO Inhale 2 puffs into the lungs 2 (two) times daily. 08/10/19   Ngetich, Dinah C, NP  ondansetron (ZOFRAN) 4 MG tablet Take 4 mg by mouth every 8 (eight) hours as needed. 05/13/20   [provider]  pantoprazole (PROTONIX) 40 MG tablet Take 1 tablet (40 mg total) by mouth 2 (two) times daily for 14 days. 09/15/20 09/29/20  Lavonia Drafts, MD  potassium chloride SA (KLOR-CON) 20 MEQ tablet Take 1 tablet (20 mEq total) by mouth 2 (two) times daily. 07/23/20   Esterwood, Amy S, PA-C  promethazine (PROMETHEGAN) 12.5 MG suppository Place 1 suppository (12.5 mg total) rectally every 6 (six) hours as needed for nausea or vomiting. 08/16/20   Lavonia Drafts, MD  SUMAtriptan (IMITREX) 100 MG tablet TAKE 1 TABLET BY MOUTH 2X DAILY AS NEEDED FOR MIGRAINE. MAY REPEAT IN 2 HOURS IF HEADACHE PERSISTS OR RECURS. 12/11/19   Kathrynn Ducking, MD  traMADol (ULTRAM) 50 MG tablet Take 50 mg by mouth every 6 (six) hours as needed for moderate pain.  03/27/20   [provider]     Allergies Aspirin  Family History  Adopted: Yes  Problem Relation Age of Onset  . Other Son        Growing pains  . Asthma Mother   . Heart Problems Mother   . Migraines Mother   . COPD Mother   . Heart attack Mother   . Diabetes Father   . Peptic Ulcer Father   . Heart Problems Father   . COPD Father   . Heart attack Father   . Colon cancer Neg Hx   . Colon polyps Neg Hx   . Esophageal cancer Neg Hx   . Rectal cancer Neg Hx   . Stomach cancer Neg Hx     Social History Social History   Tobacco Use  . Smoking status: Former Smoker    Packs/day: 0.25    Years: 17.00    Pack years: 4.25    Types: Cigarettes    Quit date: 03/02/2013    Years since quitting: 7.5  . Smokeless tobacco: Never Used  Vaping Use  . Vaping Use: Never used  Substance Use Topics  . Alcohol use: No    Alcohol/week: 0.0 standard drinks  . Drug use: Not Currently    Types: Marijuana     Comment: former - 6 yrs ago  Review of Systems  Constitutional: No fever/chills Eyes: No visual changes.  ENT: No sore throat. Cardiovascular: Denies chest pain. Respiratory: Denies shortness of breath. Gastrointestinal: As above Genitourinary: Negative for dysuria. Musculoskeletal: Negative for back pain. Skin: Negative for rash. Neurological: Negative for headaches    ____________________________________________   PHYSICAL EXAM:  VITAL SIGNS: ED Triage Vitals  Enc Vitals Group     BP 09/15/20 0754 122/84     Pulse Rate 09/15/20 0754 81     Resp 09/15/20 0754 18     Temp 09/15/20 0754 98.7 F (37.1 C)     Temp src --      SpO2 09/15/20 0754 100 %     Weight 09/15/20 0752 113 kg (249 lb 1.9 oz)     Height 09/15/20 0752 1.778 m (5\' 10" )     Head Circumference --      Peak Flow --      Pain Score 09/15/20 0752 10     Pain Loc --      Pain Edu? --      Excl. in [place]? --     Constitutional: Alert and oriented  Nose: No congestion/rhinnorhea. Mouth/Throat: Mucous membranes are moist.    Cardiovascular: Normal rate, regular rhythm. Grossly normal heart sounds.  Good peripheral circulation. Respiratory: Normal respiratory effort.  No retractions. Lungs CTAB. Gastrointestinal: Soft and nontender. No distention.    Musculoskeletal:  Warm and well perfused Neurologic:  Normal speech and language. No gross focal neurologic deficits are appreciated.  Skin:  Skin is warm, dry and intact. No rash noted. Psychiatric: Mood and affect are normal. Speech and behavior are normal.  ____________________________________________   LABS (all labs ordered are listed, but only abnormal results are displayed)  Labs Reviewed  COMPREHENSIVE METABOLIC PANEL - Abnormal; Notable for the following components:      Result Value   Potassium 3.1 (*)    Calcium 8.6 (*)    All other components within normal limits  CBC - Abnormal; Notable for the following components:   WBC 3.7 (*)     Hemoglobin 11.9 (*)    All other components within normal limits  URINALYSIS, COMPLETE (UACMP) WITH MICROSCOPIC - Abnormal; Notable for the following components:   Color, Urine YELLOW (*)    APPearance CLOUDY (*)    Ketones, ur 5 (*)    Bacteria, UA RARE (*)    All other components within normal limits  LIPASE, BLOOD  POCT PREGNANCY, URINE   ____________________________________________  EKG  ED ECG REPORT I, Lavonia Drafts, the attending physician, personally viewed and interpreted this ECG.  Date: 09/15/2020  Rhythm: normal sinus rhythm QRS Axis: normal Intervals: normal ST/T Wave abnormalities: Nonspecific change Narrative Interpretation: no evidence of acute ischemia  ____________________________________________  RADIOLOGY  None ____________________________________________   PROCEDURES  Procedure(s) performed: No  Procedures   Critical Care performed: No ____________________________________________   INITIAL IMPRESSION / ASSESSMENT AND PLAN / ED COURSE  Pertinent labs & imaging results that were available during my care of the patient were reviewed by me and considered in my medical decision making (see chart for details).  Patient presents with epigastric pain as above.  Has had a CT scan and EGD within the last several weeks both of which were reassuring.  EGD did demonstrate H. pylori which was treated with 14 days of antibiotics with improvement.  Suspect recurrence of H. pylori.  Lab work today is reassuring, abdominal exam is reassuring.  Differential also includes gastritis related to H.  pylori, PUD less likely given recent EGD, pancreatitis but lipase is normal today.  Not consistent with cholecystitis given normal LFTs.  We will treat with IV morphine, IV Zofran, IV fluids and reevaluate   Patient mildly better after IM morphine  Discussed with her retreating for H. pylori for suspected treatment failure, will do PPI clarithromycin amoxicillin and  Flagyl, close follow-up with PCP and GI    ____________________________________________   FINAL CLINICAL IMPRESSION(S) / ED DIAGNOSES  Final diagnoses:  Acute gastritis without hemorrhage, unspecified gastritis type        Note:  This document was prepared using Dragon voice recognition software and may include unintentional dictation errors.   Lavonia Drafts, MD 09/15/20 1446

## 2020-10-03 ENCOUNTER — Encounter (HOSPITAL_COMMUNITY): Payer: Self-pay | Admitting: Psychiatry

## 2020-10-03 ENCOUNTER — Other Ambulatory Visit: Payer: Self-pay

## 2020-10-03 ENCOUNTER — Telehealth (INDEPENDENT_AMBULATORY_CARE_PROVIDER_SITE_OTHER): Payer: 59 | Admitting: Psychiatry

## 2020-10-03 DIAGNOSIS — F4010 Social phobia, unspecified: Secondary | ICD-10-CM

## 2020-10-03 DIAGNOSIS — F330 Major depressive disorder, recurrent, mild: Secondary | ICD-10-CM | POA: Diagnosis not present

## 2020-10-03 DIAGNOSIS — F900 Attention-deficit hyperactivity disorder, predominantly inattentive type: Secondary | ICD-10-CM

## 2020-10-03 MED ORDER — AMPHETAMINE-DEXTROAMPHET ER 20 MG PO CP24: 20.0000 mg | ORAL_CAPSULE | ORAL | 0 refills | Status: DC | PRN

## 2020-10-03 MED ORDER — TRAZODONE HCL 50 MG PO TABS: 50.0000 mg | ORAL_TABLET | Freq: Every evening | ORAL | 2 refills | Status: DC | PRN

## 2020-10-03 MED ORDER — ALPRAZOLAM 0.25 MG PO TABS: 0.2500 mg | ORAL_TABLET | Freq: Two times a day (BID) | ORAL | 0 refills | Status: DC | PRN

## 2020-10-03 NOTE — Progress Notes (Signed)
Psychiatric Initial Adult Assessment  Virtual Visit via Video Note  I connected with Victoria Moore on 10/03/20 at  8:15 AM EDT by a video enabled telemedicine application and verified that I am speaking with the correct person using two identifiers.  Location: Patient: Home Provider: Clinic   I discussed the limitations of evaluation and management by telemedicine and the availability of in person appointments. The patient expressed understanding and agreed to proceed.  I provided 45 minutes of non-face-to-face time during this encounter.    Patient Identification: Victoria Moore MRN:  [id number] Date of Evaluation:  10/03/2020 Referral Source: Maryjean Ka, MD.   Chief Complaint:"I need medication refills"   Visit Diagnosis:    ICD-10-CM   1. Social anxiety disorder  F40.10 ALPRAZolam (XANAX) 0.25 MG tablet    Ambulatory referral to Social Work  2. Attention deficit hyperactivity disorder (ADHD), predominantly inattentive type  F90.0 amphetamine-dextroamphetamine (ADDERALL XR) 20 MG 24 hr capsule  3. Mild episode of recurrent major depressive disorder ([place])  F33.0 Ambulatory referral to Social Work    traZODone (DESYREL) 50 MG tablet    History of Present Illness:  Victoria Moore is a 44 year old female who presents for initial psychiatric evaluation. She has a history of anxiety, ADHD, and depression and is currently managed on Xanax 0.5mg -1mg  as needed daily and Adderall 20mg  daily. She also notes she was diagnosed with seasonal depression and anxiety.  This encounter was performed via telehealth. Provider checked PDMP to verify doses of medication.   On evaluation today, the patient is pleasant and cooperative. She appears well groomed and her speech is at a normal rate and volume. Patient states she is currently feeling anxious but not depressed. She rates her depression a 5/10 [with 10 being the most severe]. She states her anxiety comes on out of nowhere. She endorses  palpitations, dizziness, shortness of breath, and nausea associated with these attacks. She also endorses "not being a people person" and having social anxiety, which causes her to avoid going to the grocery store. She uses deep breathing as a coping mechanism for her anxiety, which does help her. Provider educated the patient on sensory awareness and grounding techniques as additional coping mechanisms she can use.  She denies any history of trauma. She endorses insomnia [3-5 hours/night], fatigue, racing thoughts, mood fluctuations, and irritability.  She denies feelings of worthlessness/hopelessness, difficulty concentrating, impulsivity, AVH/SI/HI or poor appetite. She had gastric bypass surgery in April of 2021 and states her appetite is gradually improving.   Patient is agreeable to decrease her dose of Xanax from 0.5mg - 1 mg as needed to 0.25mg  twice daily as needed and continue on her current dose of Adderall 20mg . She also agrees to start trazodone 25 mg-50mg  as needed for sleep. She will follow-up with outpatient psychiatry in 3 months and start with outpatient therapy as well. No other questions or concerns noted at this time.   Associated Signs/Symptoms: Depression Symptoms:  insomnia, fatigue, anxiety, panic attacks, loss of energy/fatigue, (Hypo) Manic Symptoms:  Elevated Mood, Flight of Ideas, Anxiety Symptoms:  Excessive Worry, Panic Symptoms, Social Anxiety, Psychotic Symptoms:  Denies PTSD Symptoms: Had a traumatic exposure:  Denies  Past Psychiatric History: Anxiety, depression, and ADHD  Previous Psychotropic Medications: Notes that she has only tried xanax and adderall  Substance Abuse History in the last 12 months:  No.  Consequences of Substance Abuse: NA  Past Medical History:  Past Medical History:  Diagnosis Date  . Acute low back pain without sciatica  06/22/2018  . ADHD   . Allergy   . Anemia   . Anxiety   . Arthritis   . Arthritis of ankle joint  11/10/2016   Refer to Rheumatology see Nov 25 2016 [place] :  ? Fibromyalgia, doubt sarcoid   . Asthma   . Blood transfusion without reported diagnosis   . Chest wall pain 05/26/2018  . Chronic kidney disease   . Chronic low back pain with sciatica 09/15/2018  . Common migraine with intractable migraine 10/07/2016  . Cough variant asthma vs UACS  10/23/2016   - Spirometry 04/15/2016  Very truncated exp loop effort dep portion only  - Allergy profile 10/22/2016 >  Eos 0.2/  IgE  52 RAST POS grass/trees/ragweed  10/22/2016  After extensive coaching HFA effectiveness =    75% try duelra 100 2bid > improved  . Daytime sleepiness 05/26/2018  . Decreased pedal pulses 10/04/2018  . Depression   . Depression, major, single episode, moderate ([place]) 05/26/2018  . Diabetes ([place])   . Dyspnea 04/15/2016   04/15/2016  Walked RA x 3 laps @ 185 ft each stopped due to  End of study, nl pace, no sob or desat    - Spirometry 04/15/2016  Very truncated exp loop effort dep portion only  - 10/22/2016  Walked RA x 3 laps @ 185 ft each stopped due to  End of study, slow pace, min sob/ no desat - full pfts rec 10/22/2016 >>>      . Edema   . Essential hypertension, benign 11/15/2016  . Fibroids 03/11/2016  . Frequency of urination 09/11/2018  . GERD (gastroesophageal reflux disease)   . Herniated lumbar intervertebral disc   . Hiatal hernia   . Hilar adenopathy   . Hypertension   . Hypokalemia 06/28/2018  . Impaired fasting blood sugar 09/11/2018  . Iron deficiency   . Leg pain 12/07/2018  . Low libido 11/27/2018  . Migraine   . Mild sleep apnea 08/03/2018   HST 06/20/18  AHI  8.1 / snoring with 02 nadir 80% >  08/03/2018 rec sleep medicine consultation   . Morbid (severe) obesity due to excess calories ([place]) 10/23/2016  . Personal history of sarcoidosis 05/26/2018  . Prolonged capillary refill time   . Right leg swelling 08/18/2018  . Sarcoidosis    personal history of  . Seizures ([place])    history of   . Sleep apnea   .  Spondylosis   . Vitamin D deficiency     Past Surgical History:  Procedure Laterality Date  . ABDOMINAL HYSTERECTOMY    . ESOPHAGEAL MANOMETRY N/A 09/05/2019   Procedure: ESOPHAGEAL MANOMETRY (EM);  Surgeon: Lavena Bullion, DO;  Location: [facility];  Service: Gastroenterology;  Laterality: N/A;  . LAPAROSCOPIC OVARIAN CYSTECTOMY Left 03/11/2016   Procedure: LAPAROSCOPIC OVARIAN CYSTECTOMY;  Surgeon: Eldred Manges, MD;  Location: [facility];  Service: Gynecology;  Laterality: Left;  . LAPAROSCOPIC VAGINAL HYSTERECTOMY WITH SALPINGECTOMY Bilateral 03/11/2016   Procedure: LAPAROSCOPIC ASSISTED VAGINAL HYSTERECTOMY WITH SALPINGECTOMY;  Surgeon: Eldred Manges, MD;  Location: [facility];  Service: Gynecology;  Laterality: Bilateral;  . [place] IMPEDANCE STUDY  09/05/2019   Procedure: [place] IMPEDANCE STUDY;  Surgeon: Lavena Bullion, DO;  Location: [facility];  Service: Gastroenterology;;  . TUBAL LIGATION    . UPPER GASTROINTESTINAL ENDOSCOPY  10/11/2019   06/2019    Family Psychiatric History: Patient unaware of family history because she was adopted.  Family History:  Family History  Adopted: Yes  Problem Relation Age of Onset  . Other Son        Growing pains  . Asthma Mother   . Heart Problems Mother   . Migraines Mother   . COPD Mother   . Heart attack Mother   . Diabetes Father   . Peptic Ulcer Father   . Heart Problems Father   . COPD Father   . Heart attack Father   . Colon cancer Neg Hx   . Colon polyps Neg Hx   . Esophageal cancer Neg Hx   . Rectal cancer Neg Hx   . Stomach cancer Neg Hx     Social History:   Social History   Socioeconomic History  . Marital status: Married    Spouse name: Not on file  . Number of children: 1  . Years of education: 20  . Highest education level: Not on file  Occupational History  . Occupation: Company secretary  Tobacco Use  . Smoking status: Former Smoker    Packs/day: 0.25    Years: 17.00    Pack years: 4.25    Types:  Cigarettes    Quit date: 03/02/2013    Years since quitting: 7.5  . Smokeless tobacco: Never Used  Vaping Use  . Vaping Use: Never used  Substance and Sexual Activity  . Alcohol use: No    Alcohol/week: 0.0 standard drinks  . Drug use: Not Currently    Types: Marijuana    Comment: former - 6 yrs ago  . Sexual activity: Yes    Birth control/protection: Surgical  Other Topics Concern  . Not on file  Social History Narrative   She lives w/ her fiance'. She has one son.    Highest level of education:  12th grade, did not graduate. Currently back in school.   Right-handed   Caffeine: 1 cup of coffee some mornings      Social History      Diet? No       Do you drink/eat things with caffeine? yes      Marital status?           Single                          What year were you married?       Do you live in a house, apartment, assisted living, condo, trailer, etc.? house      Is it one or more stories?     How many persons live in your home?  2      Do you have any pets in your home? (please list)   No       Highest level of education completed? Some college       Current or past profession:       Do you exercise?       Yes                                Type & how often? Daily       Advanced Directives      Do you have a living will? No       Do you have a DNR form?                                  If not,  do you want to discuss one? No       Do you have signed POA/HPOA for forms?  No       Functional Status      Do you have difficulty bathing or dressing yourself? No       Do you have difficulty preparing food or eating? No       Do you have difficulty managing your medications? No       Do you have difficulty managing your finances? No       Do you have difficulty affording your medications? No       Social Determinants of Health   Financial Resource Strain:   . Difficulty of Paying Living Expenses: Not on file  Food Insecurity:   . Worried About Paediatric nurse in the Last Year: Not on file  . Ran Out of Food in the Last Year: Not on file  Transportation Needs:   . Lack of Transportation (Medical): Not on file  . Lack of Transportation (Non-Medical): Not on file  Physical Activity:   . Days of Exercise per Week: Not on file  . Minutes of Exercise per Session: Not on file  Stress:   . Feeling of Stress : Not on file  Social Connections:   . Frequency of Communication with Friends and Family: Not on file  . Frequency of Social Gatherings with Friends and Family: Not on file  . Attends Religious Services: Not on file  . Active Member of Clubs or Organizations: Not on file  . Attends Archivist Meetings: Not on file  . Marital Status: Not on file    Additional Social History: Patient resides in [place] with her fiance. She has one child. She works Horticulturist, commercial. She denies tobacco, alcohol, or illegal drug use.  Also getting her bachelor's in health and business administration.  Allergies:   Allergies  Allergen Reactions  . Aspirin Anaphylaxis and Hives         Metabolic Disorder Labs: Lab Results  Component Value Date   HGBA1C 6.0 (H) 04/28/2020   MPG 125.5 04/28/2020   MPG 128 08/22/2019   No results found for: PROLACTIN Lab Results  Component Value Date   CHOL 192 08/22/2019   TRIG 105 08/22/2019   HDL 53 08/22/2019   CHOLHDL 3.6 08/22/2019   VLDL 12.6 06/27/2018   LDLCALC 118 (H) 08/22/2019   LDLCALC 115 (H) 06/27/2018   Lab Results  Component Value Date   TSH 1.21 08/22/2019    Therapeutic Level Labs: No results found for: LITHIUM No results found for: CBMZ No results found for: VALPROATE  Current Medications: Current Outpatient Medications  Medication Sig Dispense Refill  . ACETAMINOPHEN EXTRA STRENGTH 500 MG tablet Take 1,000 mg by mouth every 6 (six) hours as needed for pain.    Marland Kitchen albuterol (PROVENTIL) (2.5 MG/3ML) 0.083% nebulizer solution Take 2.5 mg by nebulization every 6  (six) hours as needed for wheezing or shortness of breath.    Marland Kitchen albuterol (VENTOLIN HFA) 108 (90 Base) MCG/ACT inhaler Inhale 1-2 puffs into the lungs every 4 (four) hours as needed for wheezing or shortness of breath. 8 g 3  . ALPRAZolam (XANAX) 0.25 MG tablet Take 1 tablet (0.25 mg total) by mouth 2 (two) times daily as needed for anxiety. 60 tablet 0  . amphetamine-dextroamphetamine (ADDERALL XR) 20 MG 24 hr capsule Take 1 capsule (20 mg total) by mouth as needed. 30 capsule 0  . chlorthalidone (HYGROTON)  25 MG tablet TAKE 1 TABLET BY MOUTH EVERY DAY (Patient taking differently: Take 25 mg by mouth daily. ) 90 tablet 1  . ferrous sulfate (FER-IN-SOL) 75 (15 Fe) MG/ML SOLN Take 0.8 mLs (60 mg total) by mouth 2 (two) times daily. With food 50 mL 0  . Fluticasone-Salmeterol (ADVAIR DISKUS) 100-50 MCG/DOSE AEPB Inhale 1 puff into the lungs 2 (two) times daily. 60 each 5  . GAVILAX 17 GM/SCOOP powder Take 17 g by mouth daily.    Marland Kitchen HYDROcodone-acetaminophen (NORCO/VICODIN) 5-325 MG tablet Take 1 tablet by mouth every 4 (four) hours as needed for moderate pain. 20 tablet 0  . metFORMIN (GLUCOPHAGE) 500 MG tablet Take 500 mg by mouth 2 (two) times daily with a meal.    . mometasone-formoterol (DULERA) 100-5 MCG/ACT AERO Inhale 2 puffs into the lungs 2 (two) times daily. 13 g 3  . ondansetron (ZOFRAN) 4 MG tablet Take 4 mg by mouth every 8 (eight) hours as needed.    . pantoprazole (PROTONIX) 40 MG tablet Take 1 tablet (40 mg total) by mouth 2 (two) times daily for 14 days. 28 tablet 0  . potassium chloride SA (KLOR-CON) 20 MEQ tablet Take 1 tablet (20 mEq total) by mouth 2 (two) times daily. 10 tablet 0  . promethazine (PROMETHEGAN) 12.5 MG suppository Place 1 suppository (12.5 mg total) rectally every 6 (six) hours as needed for nausea or vomiting. 12 each 0  . SUMAtriptan (IMITREX) 100 MG tablet TAKE 1 TABLET BY MOUTH 2X DAILY AS NEEDED FOR MIGRAINE. MAY REPEAT IN 2 HOURS IF HEADACHE PERSISTS OR RECURS.  9 tablet 3  . traMADol (ULTRAM) 50 MG tablet Take 50 mg by mouth every 6 (six) hours as needed for moderate pain.     . traZODone (DESYREL) 50 MG tablet Take 1 tablet (50 mg total) by mouth at bedtime as needed for sleep. 50 tablet 2   Current Facility-Administered Medications  Medication Dose Route Frequency Provider Last Rate Last Admin  . nitroGLYCERIN (NITROSTAT) SL tablet 0.4 mg  0.4 mg Sublingual Q5 min PRN Ngetich, Dinah C, NP   0.4 mg at 09/14/19 1621    Musculoskeletal: Strength & Muscle Tone: Unable to assess due to telehealth visit Gait & Station: Unable to assess due to telehealth visit Patient leans: N/A  Psychiatric Specialty Exam: Review of Systems  Last menstrual period 02/19/2016.There is no height or weight on file to calculate BMI.  General Appearance: Unable to assess due to telehealth visit  Eye Contact:  Unable to assess due to telehealth visit  Speech:  Clear and Coherent and Normal Rate  Volume:  Normal  Mood:  Anxious and Depressed  Affect:  Appropriate  Thought Process:  Coherent, Goal Directed and Linear  Orientation:  Full (Time, Place, and Person)  Thought Content:  WDL and Logical  Suicidal Thoughts:  No  Homicidal Thoughts:  No  Memory:  Immediate;   Good Recent;   Good Remote;   Good  Judgement:  Good  Insight:  Good  Psychomotor Activity:  Normal  Concentration:  Concentration: Good and Attention Span: Good  Recall:  Good  Fund of Knowledge:Good  Language: Good  Akathisia:  No  Handed:  Right  AIMS (if indicated): Not done  Assets:  Communication Skills Desire for Improvement Financial Resources/Insurance Housing Social Support  ADL's:  Intact  Cognition: WNL  Sleep:  Good   Screenings: GAD-7     Office Visit from 08/18/2018 in [facility] Visit  from 06/22/2018 in [facility] Visit from 05/26/2018 in [facility] Visit from 02/08/2018 in [place] Office Visit from 01/06/2018 in [place]  Total GAD-7 Score 7 8 9 16 14     PHQ2-9     Office Visit from 08/18/2018 in [place] Visit from 06/22/2018 in [place] Visit from 05/26/2018 in [place] Visit from 02/08/2018 in [place] Office Visit from 01/06/2018 in [place]  PHQ-2 Total Score 4 2 2 3 1   PHQ-9 Total Score 10 12 14 18 13       Assessment and Plan: Patient anxiety, poor sleep, and depression.  She however reports that her medication regimen is effective in managing her psychiatric conditions.  Patient interested in coping mechanisms to help manage anxiety.  Provider discussed grounding techniques, sensory exercises, and deep breathing.  Patient also referred to outpatient therapy for counseling.  Patient agreeable to take Xanax 0.25 mg twice daily as needed for anxiety.  She also will start trazodone 25-50 mg as needed for sleep.  She will continue all medications as prescribed.    1. Social anxiety disorder  Continue- ALPRAZolam (XANAX) 0.25 MG tablet; Take 1 tablet (0.25 mg total) by mouth 2 (two) times daily as needed for anxiety.  Dispense: 60 tablet; Refill: 0 - Ambulatory referral to Social Work  2. Attention deficit hyperactivity disorder (ADHD), predominantly inattentive type  Continue- amphetamine-dextroamphetamine (ADDERALL XR) 20 MG 24 hr capsule; Take 1 capsule (20 mg total) by mouth as needed.  Dispense: 30 capsule; Refill: 0  3. Mild episode of recurrent major depressive disorder ([place])  - Ambulatory referral to Social Work Start- traZODone (DESYREL) 50 MG tablet; Take 1 tablet (50 mg total) by mouth at bedtime as needed for sleep.  Dispense: 50 tablet; Refill: 2   Follow-up in 3 months   Salley Slaughter, NP 10/8/20218:42 AM

## 2020-10-10 ENCOUNTER — Other Ambulatory Visit: Payer: Self-pay | Admitting: Adult Health

## 2020-10-14 ENCOUNTER — Telehealth: Payer: Self-pay | Admitting: Internal Medicine

## 2020-10-14 ENCOUNTER — Other Ambulatory Visit: Payer: Self-pay | Admitting: Adult Health

## 2020-10-14 MED ORDER — BREO ELLIPTA 100-25 MCG/INH IN AEPB: 1.0000 | INHALATION_SPRAY | Freq: Every day | RESPIRATORY_TRACT | 0 refills | Status: DC

## 2020-10-14 NOTE — Telephone Encounter (Signed)
Spoke with the pt and notified of recs per MW  She verbalized understanding  Rx was sent  Pt advised to keep appt for refills and informed of our new office location

## 2020-10-14 NOTE — Telephone Encounter (Signed)
Go ahead and order BREO 100 one each am

## 2020-10-14 NOTE — Telephone Encounter (Signed)
Attempted to refill Advair 100/50, this medication is not on the preferred list for her insurance.  I called the patient and let her know about the inhaler and that we would call her after we heard back from Dr. Melvyn Novas.  Scheduled her for a f/u with Dr. Melvyn Novas on Monday 10/25 at 10:30 am. Covered inhalers O[facility]:REQJEA HFA 45-21, 115-21 and 230-21 Advair Diskus 250-50, 500-50 Breo 100-25, 200-25 Anoro 62.5-25 Trelegy 100-62.5-25, 200-62.5-25  She did say that the Advair did not work very well for her. Dr. Melvyn Novas,  please advise on inhaler to order.  Thank you.

## 2020-10-15 ENCOUNTER — Other Ambulatory Visit: Payer: Self-pay | Admitting: Physical Medicine & Rehabilitation

## 2020-10-15 DIAGNOSIS — M5412 Radiculopathy, cervical region: Secondary | ICD-10-CM | POA: Insufficient documentation

## 2020-10-15 DIAGNOSIS — M5416 Radiculopathy, lumbar region: Secondary | ICD-10-CM

## 2020-10-20 ENCOUNTER — Ambulatory Visit: Payer: 59 | Admitting: Internal Medicine

## 2020-10-24 ENCOUNTER — Encounter (HOSPITAL_BASED_OUTPATIENT_CLINIC_OR_DEPARTMENT_OTHER): Payer: Self-pay | Admitting: *Deleted

## 2020-10-24 ENCOUNTER — Emergency Department (HOSPITAL_BASED_OUTPATIENT_CLINIC_OR_DEPARTMENT_OTHER)
Admission: EM | Admit: 2020-10-24 | Discharge: 2020-10-24 | Disposition: A | Discharge status: Home / Self Care | Payer: 59 | Attending: Emergency Medicine | Admitting: Emergency Medicine

## 2020-10-24 ENCOUNTER — Other Ambulatory Visit: Payer: Self-pay

## 2020-10-24 DIAGNOSIS — Z7984 Long term (current) use of oral hypoglycemic drugs: Secondary | ICD-10-CM | POA: Insufficient documentation

## 2020-10-24 DIAGNOSIS — R002 Palpitations: Secondary | ICD-10-CM | POA: Diagnosis not present

## 2020-10-24 DIAGNOSIS — Z79899 Other long term (current) drug therapy: Secondary | ICD-10-CM | POA: Diagnosis not present

## 2020-10-24 DIAGNOSIS — E876 Hypokalemia: Secondary | ICD-10-CM | POA: Insufficient documentation

## 2020-10-24 DIAGNOSIS — Z7951 Long term (current) use of inhaled steroids: Secondary | ICD-10-CM | POA: Insufficient documentation

## 2020-10-24 DIAGNOSIS — J45909 Unspecified asthma, uncomplicated: Secondary | ICD-10-CM | POA: Diagnosis not present

## 2020-10-24 DIAGNOSIS — E1122 Type 2 diabetes mellitus with diabetic chronic kidney disease: Secondary | ICD-10-CM | POA: Insufficient documentation

## 2020-10-24 DIAGNOSIS — Z87891 Personal history of nicotine dependence: Secondary | ICD-10-CM | POA: Insufficient documentation

## 2020-10-24 DIAGNOSIS — R42 Dizziness and giddiness: Secondary | ICD-10-CM | POA: Diagnosis present

## 2020-10-24 DIAGNOSIS — I129 Hypertensive chronic kidney disease with stage 1 through stage 4 chronic kidney disease, or unspecified chronic kidney disease: Secondary | ICD-10-CM | POA: Insufficient documentation

## 2020-10-24 DIAGNOSIS — N189 Chronic kidney disease, unspecified: Secondary | ICD-10-CM | POA: Diagnosis not present

## 2020-10-24 LAB — BASIC METABOLIC PANEL
Anion gap: 12 (ref 5–15)
BUN: 7 mg/dL (ref 6–20)
CO2: 28 mmol/L (ref 22–32)
Calcium: 8.5 mg/dL — ABNORMAL LOW (ref 8.9–10.3)
Chloride: 99 mmol/L (ref 98–111)
Creatinine, Ser: 1.02 mg/dL — ABNORMAL HIGH (ref 0.44–1.00)
GFR, Estimated: >60 mL/min (ref >60)
Glucose, Bld: 81 mg/dL (ref 70–99)
Potassium: 2.5 mmol/L — CL (ref 3.5–5.1)
Sodium: 139 mmol/L (ref 135–145)

## 2020-10-24 LAB — URINALYSIS, ROUTINE W REFLEX MICROSCOPIC
Glucose, UA: NEGATIVE mg/dL
Hgb urine dipstick: NEGATIVE
Ketones, ur: 15 mg/dL — AB
Leukocytes,Ua: NEGATIVE
Nitrite: NEGATIVE
Protein, ur: NEGATIVE mg/dL
Specific Gravity, Urine: >1.03 — ABNORMAL HIGH (ref 1.005–1.030)
pH: 6 (ref 5.0–8.0)

## 2020-10-24 LAB — CBC WITH DIFFERENTIAL/PLATELET
Abs Immature Granulocytes: 0.01 10*3/uL (ref 0.00–0.07)
Basophils Absolute: 0 10*3/uL (ref 0.0–0.1)
Basophils Relative: 1 %
Eosinophils Absolute: 0.2 10*3/uL (ref 0.0–0.5)
Eosinophils Relative: 4 %
HCT: 35.1 % — ABNORMAL LOW (ref 36.0–46.0)
Hemoglobin: 11.3 g/dL — ABNORMAL LOW (ref 12.0–15.0)
Immature Granulocytes: 0 %
Lymphocytes Relative: 42 %
Lymphs Abs: 1.8 10*3/uL (ref 0.7–4.0)
MCH: 28.7 pg (ref 26.0–34.0)
MCHC: 32.2 g/dL (ref 30.0–36.0)
MCV: 89.1 fL (ref 80.0–100.0)
Monocytes Absolute: 0.3 10*3/uL (ref 0.1–1.0)
Monocytes Relative: 7 %
Neutro Abs: 2 10*3/uL (ref 1.7–7.7)
Neutrophils Relative %: 46 %
Platelets: 181 10*3/uL (ref 150–400)
RBC: 3.94 MIL/uL (ref 3.87–5.11)
RDW: 12.9 % (ref 11.5–15.5)
WBC: 4.3 10*3/uL (ref 4.0–10.5)
nRBC: 0 % (ref 0.0–0.2)

## 2020-10-24 LAB — MAGNESIUM: Magnesium: 2 mg/dL (ref 1.7–2.4)

## 2020-10-24 MED ORDER — ONDANSETRON HCL 4 MG/2ML IJ SOLN
4.0000 mg | Freq: Once | INTRAMUSCULAR | Status: AC
  Administered 2020-10-24: 4 mg via INTRAVENOUS
  Filled 2020-10-24: qty 2

## 2020-10-24 MED ORDER — ACETAMINOPHEN 325 MG PO TABS
650.0000 mg | ORAL_TABLET | Freq: Once | ORAL | Status: AC
  Administered 2020-10-24: 650 mg via ORAL
  Filled 2020-10-24: qty 2

## 2020-10-24 MED ORDER — POTASSIUM CHLORIDE 10 MEQ/100ML IV SOLN: 10.0000 meq | INTRAVENOUS | Status: DC

## 2020-10-24 MED ORDER — SODIUM CHLORIDE 0.9 % IV BOLUS
1000.0000 mL | Freq: Once | INTRAVENOUS | Status: AC
  Administered 2020-10-24: 1000 mL via INTRAVENOUS

## 2020-10-24 MED ORDER — POTASSIUM CHLORIDE CRYS ER 20 MEQ PO TBCR
40.0000 meq | EXTENDED_RELEASE_TABLET | Freq: Once | ORAL | Status: AC
  Administered 2020-10-24: 40 meq via ORAL
  Filled 2020-10-24: qty 2

## 2020-10-24 NOTE — ED Triage Notes (Signed)
States she has a new diagnosis of chronic fatigue syndrome and she feels like she is having a flare.

## 2020-10-24 NOTE — ED Provider Notes (Signed)
[facility] EMERGENCY DEPARTMENT Provider Note  CSN: [id number] Arrival date & time: 10/24/20 1431    History Chief Complaint  Patient presents with  . Dizziness    HPI  Victoria Moore is a 44 y.o. female reports she was in her usual state of health when she went to work this morning around 11am. About an hour later she had onset of dizziness, not described as room spinning or lightheadedness but states it is worse with movement. Dizziness is associated with nausea, diaphoresis, headache and palpitations. She called her PCP office but before she heard back, coworkers had called EMS for her. Per EMS, she had normal vitals and EKG. She has begun to feel better while waiting for ED room. She reports decreased UOP recently, she has had prior gastric bypass surgery.    Past Medical History:  Diagnosis Date  . Acute low back pain without sciatica 06/22/2018  . ADHD   . Allergy   . Anemia   . Anxiety   . Arthritis   . 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 Fibromyalgia, doubt sarcoid   . Asthma   . Blood transfusion without reported diagnosis   . Chest wall pain 05/26/2018  . Chronic kidney disease   . Chronic low back pain with sciatica 09/15/2018  . Common migraine with intractable migraine 10/07/2016  . Cough variant asthma vs UACS  10/23/2016   - Spirometry 04/15/2016  Very truncated exp loop effort dep portion only  - Allergy profile 10/22/2016 >  Eos 0.2/  IgE  52 RAST POS grass/trees/ragweed  10/22/2016  After extensive coaching HFA effectiveness =    75% try duelra 100 2bid > improved  . Daytime sleepiness 05/26/2018  . Decreased pedal pulses 10/04/2018  . Depression   . Depression, major, single episode, moderate ([place]) 05/26/2018  . Diabetes ([place])   . Dyspnea 04/15/2016   04/15/2016  Walked RA x 3 laps @ 185 ft each stopped due to  End of study, nl pace, no sob or desat    - Spirometry 04/15/2016  Very truncated exp loop  effort dep portion only  - 10/22/2016  Walked RA x 3 laps @ 185 ft each stopped due to  End of study, slow pace, min sob/ no desat - full pfts rec 10/22/2016 >>>      . Edema   . Essential hypertension, benign 11/15/2016  . Fibroids 03/11/2016  . Frequency of urination 09/11/2018  . GERD (gastroesophageal reflux disease)   . Herniated lumbar intervertebral disc   . Hiatal hernia   . Hilar adenopathy   . Hypertension   . Hypokalemia 06/28/2018  . Impaired fasting blood sugar 09/11/2018  . Iron deficiency   . Leg pain 12/07/2018  . Low libido 11/27/2018  . Migraine   . Mild sleep apnea 08/03/2018   HST 06/20/18  AHI  8.1 / snoring with 02 nadir 80% >  08/03/2018 rec sleep medicine consultation   . Morbid (severe) obesity due to excess calories ([place]) 10/23/2016  . Personal history of sarcoidosis 05/26/2018  . Prolonged capillary refill time   . Right leg swelling 08/18/2018  . Sarcoidosis    personal history of  . Seizures ([place])    history of   . Sleep apnea   . Spondylosis   . Vitamin D deficiency     Past Surgical History:  Procedure Laterality Date  . ABDOMINAL HYSTERECTOMY    .  ESOPHAGEAL MANOMETRY N/A 09/05/2019   Procedure: ESOPHAGEAL MANOMETRY (EM);  Surgeon: Lavena Bullion, DO;  Location: [facility];  Service: Gastroenterology;  Laterality: N/A;  . LAPAROSCOPIC OVARIAN CYSTECTOMY Left 03/11/2016   Procedure: LAPAROSCOPIC OVARIAN CYSTECTOMY;  Surgeon: Eldred Manges, MD;  Location: [facility];  Service: Gynecology;  Laterality: Left;  . LAPAROSCOPIC VAGINAL HYSTERECTOMY WITH SALPINGECTOMY Bilateral 03/11/2016   Procedure: LAPAROSCOPIC ASSISTED VAGINAL HYSTERECTOMY WITH SALPINGECTOMY;  Surgeon: Eldred Manges, MD;  Location: [facility];  Service: Gynecology;  Laterality: Bilateral;  . [place] IMPEDANCE STUDY  09/05/2019   Procedure: [place] IMPEDANCE STUDY;  Surgeon: Lavena Bullion, DO;  Location: [facility];  Service: Gastroenterology;;  . TUBAL LIGATION    . UPPER GASTROINTESTINAL  ENDOSCOPY  10/11/2019   06/2019    Family History  Adopted: Yes  Problem Relation Age of Onset  . Other Son        Growing pains  . Asthma Mother   . Heart Problems Mother   . Migraines Mother   . COPD Mother   . Heart attack Mother   . Diabetes Father   . Peptic Ulcer Father   . Heart Problems Father   . COPD Father   . Heart attack Father   . Colon cancer Neg Hx   . Colon polyps Neg Hx   . Esophageal cancer Neg Hx   . Rectal cancer Neg Hx   . Stomach cancer Neg Hx     Social History   Tobacco Use  . Smoking status: Former Smoker    Packs/day: 0.25    Years: 17.00    Pack years: 4.25    Types: Cigarettes    Quit date: 03/02/2013    Years since quitting: 7.6  . Smokeless tobacco: Never Used  Vaping Use  . Vaping Use: Never used  Substance Use Topics  . Alcohol use: No    Alcohol/week: 0.0 standard drinks  . Drug use: Not Currently    Types: Marijuana    Comment: former - 6 yrs ago     Home Medications Prior to Admission medications   Medication Sig Start Date End Date Taking? Authorizing Provider  ACETAMINOPHEN EXTRA STRENGTH 500 MG tablet Take 1,000 mg by mouth every 6 (six) hours as needed for pain. 04/08/20   [provider]  albuterol (PROVENTIL) (2.5 MG/3ML) 0.083% nebulizer solution Take 2.5 mg by nebulization every 6 (six) hours as needed for wheezing or shortness of breath.    [provider]  albuterol (VENTOLIN HFA) 108 (90 Base) MCG/ACT inhaler Inhale 1-2 puffs into the lungs every 4 (four) hours as needed for wheezing or shortness of breath. 01/14/20   Parrett, Fonnie Mu, NP  ALPRAZolam (XANAX) 0.25 MG tablet Take 1 tablet (0.25 mg total) by mouth 2 (two) times daily as needed for anxiety. 10/03/20   Salley Slaughter, NP  amphetamine-dextroamphetamine (ADDERALL XR) 20 MG 24 hr capsule Take 1 capsule (20 mg total) by mouth as needed. 10/03/20   Salley Slaughter, NP  chlorthalidone (HYGROTON) 25 MG tablet TAKE 1 TABLET BY MOUTH EVERY  DAY Patient taking differently: Take 25 mg by mouth daily.  11/05/19   Ngetich, Dinah C, NP  ferrous sulfate (FER-IN-SOL) 75 (15 Fe) MG/ML SOLN Take 0.8 mLs (60 mg total) by mouth 2 (two) times daily. With food 07/22/20 08/21/20  Esterwood, Amy S, PA-C  fluticasone furoate-vilanterol (BREO ELLIPTA) 100-25 MCG/INH AEPB Inhale 1 puff into the lungs daily. 10/14/20   Tanda Rockers, MD  Fluticasone-Salmeterol (ADVAIR) 100-50 MCG/DOSE AEPB TAKE 1 PUFF BY MOUTH TWICE A DAY 10/16/20   Parrett, Tammy S, NP  GAVILAX 17 GM/SCOOP powder Take 17 g by mouth daily. 05/20/20   [provider]  HYDROcodone-acetaminophen (NORCO/VICODIN) 5-325 MG tablet Take 1 tablet by mouth every 4 (four) hours as needed for moderate pain. 08/16/20   Lavonia Drafts, MD  metFORMIN (GLUCOPHAGE) 500 MG tablet Take 500 mg by mouth 2 (two) times daily with a meal.    [provider]  mometasone-formoterol (DULERA) 100-5 MCG/ACT AERO Inhale 2 puffs into the lungs 2 (two) times daily. 08/10/19   Ngetich, Dinah C, NP  ondansetron (ZOFRAN) 4 MG tablet Take 4 mg by mouth every 8 (eight) hours as needed. 05/13/20   [provider]  pantoprazole (PROTONIX) 40 MG tablet Take 1 tablet (40 mg total) by mouth 2 (two) times daily for 14 days. 09/15/20 09/29/20  Lavonia Drafts, MD  potassium chloride SA (KLOR-CON) 20 MEQ tablet Take 1 tablet (20 mEq total) by mouth 2 (two) times daily. 07/23/20   Esterwood, Amy S, PA-C  promethazine (PROMETHEGAN) 12.5 MG suppository Place 1 suppository (12.5 mg total) rectally every 6 (six) hours as needed for nausea or vomiting. 08/16/20   Lavonia Drafts, MD  SUMAtriptan (IMITREX) 100 MG tablet TAKE 1 TABLET BY MOUTH 2X DAILY AS NEEDED FOR MIGRAINE. MAY REPEAT IN 2 HOURS IF HEADACHE PERSISTS OR RECURS. 12/11/19   Kathrynn Ducking, MD  traMADol (ULTRAM) 50 MG tablet Take 50 mg by mouth every 6 (six) hours as needed for moderate pain.  03/27/20   [provider]  traZODone (DESYREL) 50 MG  tablet Take 1 tablet (50 mg total) by mouth at bedtime as needed for sleep. 10/03/20   Salley Slaughter, NP     Allergies    Aspirin   Review of Systems   Review of Systems A comprehensive review of systems was completed and negative except as noted in HPI.    Physical Exam BP (!) 142/98   Pulse 67   Temp 98.4 F (36.9 C) (Oral)   Resp 16   Ht 5\' 10"  (1.778 m)   Wt 113 kg   LMP 02/19/2016   SpO2 100%   BMI 35.74 kg/m   Physical Exam Vitals and nursing note reviewed.  Constitutional:      Appearance: Normal appearance.  HENT:     Head: Normocephalic and atraumatic.     Nose: Nose normal.     Mouth/Throat:     Mouth: Mucous membranes are moist.  Eyes:     Extraocular Movements: Extraocular movements intact.     Conjunctiva/sclera: Conjunctivae normal.  Cardiovascular:     Rate and Rhythm: Normal rate.  Pulmonary:     Effort: Pulmonary effort is normal.     Breath sounds: Normal breath sounds.  Abdominal:     General: Abdomen is flat.     Palpations: Abdomen is soft.     Tenderness: There is no abdominal tenderness.  Musculoskeletal:        General: No swelling. Normal range of motion.     Cervical back: Neck supple.  Skin:    General: Skin is warm and dry.  Neurological:     General: No focal deficit present.     Mental Status: She is alert.  Psychiatric:        Mood and Affect: Mood normal.      ED Results / Procedures / Treatments   Labs (all labs ordered  are listed, but only abnormal results are displayed) Labs Reviewed  BASIC METABOLIC PANEL - Abnormal; Notable for the following components:      Result Value   Potassium 2.5 (*)    Creatinine, Ser 1.02 (*)    Calcium 8.5 (*)    All other components within normal limits  CBC WITH DIFFERENTIAL/PLATELET - Abnormal; Notable for the following components:   Hemoglobin 11.3 (*)    HCT 35.1 (*)    All other components within normal limits  URINALYSIS, ROUTINE W REFLEX MICROSCOPIC - Abnormal;  Notable for the following components:   APPearance CLOUDY (*)    Specific Gravity, Urine >1.030 (*)    Bilirubin Urine SMALL (*)    Ketones, ur 15 (*)    All other components within normal limits  MAGNESIUM    EKG EKG Interpretation  Date/Time:  Friday October 24 2020 17:06:00 EDT Ventricular Rate:  49 PR Interval:    QRS Duration: 118 QT Interval:  481 QTC Calculation: 435 R Axis:   63 Text Interpretation: Sinus bradycardia Nonspecific intraventricular conduction delay Low voltage, precordial leads Borderline T abnormalities, anterior leads No significant change since last tracing Confirmed by Calvert Cantor [phone]) on 10/24/2020 5:20:43 PM    Radiology No results found.  Procedures Procedures  Medications Ordered in the ED Medications  sodium chloride 0.9 % bolus 1,000 mL (0 mLs Intravenous Stopped 10/24/20 1935)  ondansetron (ZOFRAN) injection 4 mg (4 mg Intravenous Given 10/24/20 1811)  acetaminophen (TYLENOL) tablet 650 mg (650 mg Oral Given 10/24/20 1838)  potassium chloride SA (KLOR-CON) CR tablet 40 mEq (40 mEq Oral Given 10/24/20 1934)     MDM Rules/Calculators/A&P MDM  ED Course  I have reviewed the triage vital signs and the nursing notes.  Pertinent labs & imaging results that were available during my care of the patient were reviewed by me and considered in my medical decision making (see chart for details).  Clinical Course as of Oct 25 2039  Fri Oct 24, 2020  [place] CBC is at baseline.    [CS]  1906 BMP shows hypokalemia, patient states this is not uncommon for her and she takes oral potassium at home. Initially planned for oral and IV repletion but she states her IV is hurting her very badly and she wants it out. No signs of infiltration and she was a very difficult stick so will not attempt to replace it again. Will give oral potassium and then plan additional orals at dispo.    [CS]  1926 Magnesium is normal.    [CS]  2038 UA concentrated but no  infection. Patient advised to double her home K dose for the next few days and call her PCP for a follow up appointment next week to have labs rechecked and to discuss a ZIO patch monitor if her palpitations continue.    [CS]    Clinical Course User Index [CS] Truddie Hidden, MD    Final Clinical Impression(s) / ED Diagnoses Final diagnoses:  Dizziness  Hypokalemia  Palpitations    Rx / DC Orders ED Discharge Orders    None       Truddie Hidden, MD 10/24/20 2040

## 2020-10-24 NOTE — ED Triage Notes (Signed)
Per EMS:  Pt started having weakness, dizziness and headache x 1 hour ago. Pt has history of all 3.  VSS, no ekg changes, few PVC's

## 2020-10-25 ENCOUNTER — Encounter (HOSPITAL_COMMUNITY): Payer: Self-pay | Admitting: Emergency Medicine

## 2020-10-25 ENCOUNTER — Emergency Department (HOSPITAL_COMMUNITY)
Admission: EM | Admit: 2020-10-25 | Discharge: 2020-10-25 | Disposition: A | Discharge status: Home / Self Care | Payer: 59 | Attending: Emergency Medicine | Admitting: Emergency Medicine

## 2020-10-25 ENCOUNTER — Emergency Department (HOSPITAL_COMMUNITY): Payer: 59

## 2020-10-25 ENCOUNTER — Other Ambulatory Visit: Payer: Self-pay

## 2020-10-25 DIAGNOSIS — E876 Hypokalemia: Secondary | ICD-10-CM | POA: Diagnosis not present

## 2020-10-25 DIAGNOSIS — R0789 Other chest pain: Secondary | ICD-10-CM | POA: Insufficient documentation

## 2020-10-25 DIAGNOSIS — E119 Type 2 diabetes mellitus without complications: Secondary | ICD-10-CM | POA: Diagnosis not present

## 2020-10-25 DIAGNOSIS — N189 Chronic kidney disease, unspecified: Secondary | ICD-10-CM | POA: Insufficient documentation

## 2020-10-25 DIAGNOSIS — J45909 Unspecified asthma, uncomplicated: Secondary | ICD-10-CM | POA: Diagnosis not present

## 2020-10-25 DIAGNOSIS — Z7984 Long term (current) use of oral hypoglycemic drugs: Secondary | ICD-10-CM | POA: Insufficient documentation

## 2020-10-25 DIAGNOSIS — R001 Bradycardia, unspecified: Secondary | ICD-10-CM | POA: Diagnosis not present

## 2020-10-25 DIAGNOSIS — Z87891 Personal history of nicotine dependence: Secondary | ICD-10-CM | POA: Diagnosis not present

## 2020-10-25 DIAGNOSIS — R079 Chest pain, unspecified: Secondary | ICD-10-CM

## 2020-10-25 DIAGNOSIS — I129 Hypertensive chronic kidney disease with stage 1 through stage 4 chronic kidney disease, or unspecified chronic kidney disease: Secondary | ICD-10-CM | POA: Insufficient documentation

## 2020-10-25 LAB — BASIC METABOLIC PANEL
Anion gap: 8 (ref 5–15)
BUN: 5 mg/dL — ABNORMAL LOW (ref 6–20)
CO2: 27 mmol/L (ref 22–32)
Calcium: 8.9 mg/dL (ref 8.9–10.3)
Chloride: 105 mmol/L (ref 98–111)
Creatinine, Ser: 1.02 mg/dL — ABNORMAL HIGH (ref 0.44–1.00)
GFR, Estimated: >60 mL/min (ref >60)
Glucose, Bld: 74 mg/dL (ref 70–99)
Potassium: 3.2 mmol/L — ABNORMAL LOW (ref 3.5–5.1)
Sodium: 140 mmol/L (ref 135–145)

## 2020-10-25 LAB — CBC
HCT: 38.7 % (ref 36.0–46.0)
Hemoglobin: 12.3 g/dL (ref 12.0–15.0)
MCH: 28.9 pg (ref 26.0–34.0)
MCHC: 31.8 g/dL (ref 30.0–36.0)
MCV: 90.8 fL (ref 80.0–100.0)
Platelets: 200 10*3/uL (ref 150–400)
RBC: 4.26 MIL/uL (ref 3.87–5.11)
RDW: 13.2 % (ref 11.5–15.5)
WBC: 4.4 10*3/uL (ref 4.0–10.5)
nRBC: 0 % (ref 0.0–0.2)

## 2020-10-25 LAB — I-STAT BETA HCG BLOOD, ED (MC, WL, AP ONLY): I-stat hCG, quantitative: <5 m[IU]/mL (ref <5)

## 2020-10-25 LAB — TROPONIN I (HIGH SENSITIVITY)
Troponin I (High Sensitivity): <2 ng/L (ref <18)
Troponin I (High Sensitivity): <2 ng/L (ref <18)

## 2020-10-25 MED ORDER — POTASSIUM CHLORIDE CRYS ER 20 MEQ PO TBCR
40.0000 meq | EXTENDED_RELEASE_TABLET | Freq: Once | ORAL | Status: AC
  Administered 2020-10-25: 40 meq via ORAL
  Filled 2020-10-25: qty 2

## 2020-10-25 NOTE — ED Triage Notes (Signed)
Pt stated, yesterday I had chest pressure, SOB feeling nauseated, dizzy. I went to [facility] and the said everything was normal , so I came here today for the same problems.

## 2020-10-25 NOTE — ED Notes (Signed)
Patient verbalized understanding of dc instructions, vss, ambulatory with nad.   

## 2020-10-25 NOTE — ED Notes (Signed)
Patient provided sandwich and drink, per Provider's request.

## 2020-10-25 NOTE — ED Notes (Signed)
Pt states she having right shoulder pain which moves into her neck.

## 2020-10-25 NOTE — ED Provider Notes (Addendum)
[facility] EMERGENCY DEPARTMENT Provider Note   CSN: [id number] Arrival date & time: 10/25/20  8841     History Chief Complaint  Patient presents with  . Chest Pain    Victoria Moore is a 44 y.o. female with PMH significant for HTN and MDD who presents the ED with complaints of chest pain and disequilibrium.  I reviewed patient's medical record and she was evaluated yesterday at [facility] for similar complaints.  She was ultimately discharged home after potassium was replenished with instructions to follow-up with her primary care provider.  Labs yesterday included UA suggestive of dehydration without infection, CBC with mild anemia with hemoglobin of 11.3, and BMP notable for significant hypokalemia to 2.5.  She was provided 40 mEq K-Dur, however declined IV potassium replacement given discomfort at IV site.  On my examination, she reports that she vomited shortly after discharge from the hospital yesterday and she is experiencing profound weakness and fatigue.  She also feels lightheaded and is endorsing disequilibrium.  This morning she woke up and was experiencing chest "pressure" that is constant, worse with moving her upper extremities.  She states that she has not done any heavy lifting recently and has lower suspicion for musculoskeletal injury.  She denies any room spinning dizziness, shortness of breath, cough, abdominal pain, melena, heavy menses, urinary symptoms, or changes in bowel habits.  She feels as though she may still be dehydrated.  She is concerned because she was unable to get all of her potassium replaced given the IV discomfort.  She also states she is concerned as to why she is losing her potassium given that she takes potassium supplements at home.  No recent diarrhea or vomiting prior to yesterday's examination.  She is not on any diuretic medications, but is on chlorthalidone.   HPI     Past Medical History:  Diagnosis Date  .  Acute low back pain without sciatica 06/22/2018  . ADHD   . Allergy   . Anemia   . Anxiety   . Arthritis   . 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 Fibromyalgia, doubt sarcoid   . Asthma   . Blood transfusion without reported diagnosis   . Chest wall pain 05/26/2018  . Chronic kidney disease   . Chronic low back pain with sciatica 09/15/2018  . Common migraine with intractable migraine 10/07/2016  . Cough variant asthma vs UACS  10/23/2016   - Spirometry 04/15/2016  Very truncated exp loop effort dep portion only  - Allergy profile 10/22/2016 >  Eos 0.2/  IgE  52 RAST POS grass/trees/ragweed  10/22/2016  After extensive coaching HFA effectiveness =    75% try duelra 100 2bid > improved  . Daytime sleepiness 05/26/2018  . Decreased pedal pulses 10/04/2018  . Depression   . Depression, major, single episode, moderate ([place]) 05/26/2018  . Diabetes ([place])   . Dyspnea 04/15/2016   04/15/2016  Walked RA x 3 laps @ 185 ft each stopped due to  End of study, nl pace, no sob or desat    - Spirometry 04/15/2016  Very truncated exp loop effort dep portion only  - 10/22/2016  Walked RA x 3 laps @ 185 ft each stopped due to  End of study, slow pace, min sob/ no desat - full pfts rec 10/22/2016 >>>      . Edema   . Essential hypertension, benign 11/15/2016  .  Fibroids 03/11/2016  . Frequency of urination 09/11/2018  . GERD (gastroesophageal reflux disease)   . Herniated lumbar intervertebral disc   . Hiatal hernia   . Hilar adenopathy   . Hypertension   . Hypokalemia 06/28/2018  . Impaired fasting blood sugar 09/11/2018  . Iron deficiency   . Leg pain 12/07/2018  . Low libido 11/27/2018  . Migraine   . Mild sleep apnea 08/03/2018   HST 06/20/18  AHI  8.1 / snoring with 02 nadir 80% >  08/03/2018 rec sleep medicine consultation   . Morbid (severe) obesity due to excess calories ([place]) 10/23/2016  . Personal history of sarcoidosis 05/26/2018  . Prolonged capillary  refill time   . Right leg swelling 08/18/2018  . Sarcoidosis    personal history of  . Seizures ([place])    history of   . Sleep apnea   . Spondylosis   . Vitamin D deficiency     Patient Active Problem List   Diagnosis Date Noted  . Social anxiety disorder 10/03/2020  . Attention deficit hyperactivity disorder (ADHD), 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  . Mild episode of recurrent major depressive disorder ([place]) 10/03/2020  . Constipation 05/20/2020  . S/P gastric bypass 05/12/2020  . Intractable abdominal pain 04/27/2020  . Benign intracranial hypertension 02/14/2020  . Myopia of both eyes 02/14/2020  . 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 BMI 45.0-49.9, adult ([place]) 11/29/2019  . Other intervertebral disc degeneration, lumbar region 09/19/2019  . Complex cyst of left ovary 02/01/2019  . Sarcoidosis 02/01/2019  . Leg pain 12/07/2018  . Low libido 11/27/2018  . Prolonged capillary refill time 10/04/2018  . Decreased pedal pulses 10/04/2018  . Spondylosis 09/15/2018  . Chronic low back pain with sciatica 09/15/2018  . Impaired fasting blood sugar 09/11/2018  . Weight gain 09/11/2018  . Mild sleep apnea 08/03/2018  . Hypokalemia 06/28/2018  . Medication side effect 06/22/2018  . Acute low back pain without sciatica 06/22/2018  . Personal history of sarcoidosis 05/26/2018  . Vitamin D deficiency 05/26/2018  . Encounter for health maintenance examination with abnormal findings 05/26/2018  . Depression, major, single episode, moderate ([place]) 05/26/2018  . Daytime sleepiness 05/26/2018  . Chest pain of uncertain etiology 67/11/4579  . GERD (gastroesophageal reflux disease) 05/26/2018  . Essential hypertension, benign 11/15/2016  . 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 Morbid (severe) obesity due to excess calories ([place]) 10/23/2016  . Common migraine with intractable migraine 10/07/2016  . Dyspnea 04/15/2016  . Hilar adenopathy 04/15/2016  . Seizures ([place]) 03/11/2016   . Anemia 11/12/2014  . Iron deficiency 11/12/2014    Past Surgical History:  Procedure Laterality Date  . ABDOMINAL HYSTERECTOMY    . ESOPHAGEAL MANOMETRY N/A 09/05/2019   Procedure: ESOPHAGEAL MANOMETRY (EM);  Surgeon: Lavena Bullion, DO;  Location: [facility];  Service: Gastroenterology;  Laterality: N/A;  . LAPAROSCOPIC OVARIAN CYSTECTOMY Left 03/11/2016   Procedure: LAPAROSCOPIC OVARIAN CYSTECTOMY;  Surgeon: Eldred Manges, MD;  Location: [facility];  Service: Gynecology;  Laterality: Left;  . LAPAROSCOPIC VAGINAL HYSTERECTOMY WITH SALPINGECTOMY Bilateral 03/11/2016   Procedure: LAPAROSCOPIC ASSISTED VAGINAL HYSTERECTOMY WITH SALPINGECTOMY;  Surgeon: Eldred Manges, MD;  Location: [facility];  Service: Gynecology;  Laterality: Bilateral;  . [place] IMPEDANCE STUDY  09/05/2019   Procedure: [place] IMPEDANCE STUDY;  Surgeon: Lavena Bullion, DO;  Location: [facility];  Service: Gastroenterology;;  . TUBAL LIGATION    . UPPER GASTROINTESTINAL ENDOSCOPY  10/11/2019   06/2019     OB  History    Gravida  1   Para      Term      Preterm      AB      Living        SAB      TAB      Ectopic      Multiple      Live Births              Family History  Adopted: Yes  Problem Relation Age of Onset  . Other Son        Growing pains  . Asthma Mother   . Heart Problems Mother   . Migraines Mother   . COPD Mother   . Heart attack Mother   . Diabetes Father   . Peptic Ulcer Father   . Heart Problems Father   . COPD Father   . Heart attack Father   . Colon cancer Neg Hx   . Colon polyps Neg Hx   . Esophageal cancer Neg Hx   . Rectal cancer Neg Hx   . Stomach cancer Neg Hx     Social History   Tobacco Use  . Smoking status: Former Smoker    Packs/day: 0.25    Years: 17.00    Pack years: 4.25    Types: Cigarettes    Quit date: 03/02/2013    Years since quitting: 7.6  . Smokeless tobacco: Never Used  Vaping Use  . Vaping Use: Never used  Substance Use Topics    . Alcohol use: No    Alcohol/week: 0.0 standard drinks  . Drug use: Not Currently    Types: Marijuana    Comment: former - 6 yrs ago    Home Medications Prior to Admission medications   Medication Sig Start Date End Date Taking? Authorizing Provider  ACETAMINOPHEN EXTRA STRENGTH 500 MG tablet Take 1,000 mg by mouth every 6 (six) hours as needed for pain. 04/08/20   [provider]  albuterol (PROVENTIL) (2.5 MG/3ML) 0.083% nebulizer solution Take 2.5 mg by nebulization every 6 (six) hours as needed for wheezing or shortness of breath.    [provider]  albuterol (VENTOLIN HFA) 108 (90 Base) MCG/ACT inhaler Inhale 1-2 puffs into the lungs every 4 (four) hours as needed for wheezing or shortness of breath. 01/14/20   Parrett, Fonnie Mu, NP  ALPRAZolam (XANAX) 0.25 MG tablet Take 1 tablet (0.25 mg total) by mouth 2 (two) times daily as needed for anxiety. 10/03/20   Salley Slaughter, NP  amphetamine-dextroamphetamine (ADDERALL XR) 20 MG 24 hr capsule Take 1 capsule (20 mg total) by mouth as needed. 10/03/20   Salley Slaughter, NP  chlorthalidone (HYGROTON) 25 MG tablet TAKE 1 TABLET BY MOUTH EVERY DAY Patient taking differently: Take 25 mg by mouth daily.  11/05/19   Ngetich, Dinah C, NP  ferrous sulfate (FER-IN-SOL) 75 (15 Fe) MG/ML SOLN Take 0.8 mLs (60 mg total) by mouth 2 (two) times daily. With food 07/22/20 08/21/20  Esterwood, Amy S, PA-C  fluticasone furoate-vilanterol (BREO ELLIPTA) 100-25 MCG/INH AEPB Inhale 1 puff into the lungs daily. 10/14/20   Tanda Rockers, MD  Fluticasone-Salmeterol (ADVAIR) 100-50 MCG/DOSE AEPB TAKE 1 PUFF BY MOUTH TWICE A DAY 10/16/20   Parrett, Fonnie Mu, NP  GAVILAX 17 GM/SCOOP powder Take 17 g by mouth daily. 05/20/20   [provider]  HYDROcodone-acetaminophen (NORCO/VICODIN) 5-325 MG tablet Take 1 tablet by mouth every 4 (four) hours as needed for  moderate pain. 08/16/20   Lavonia Drafts, MD  metFORMIN (GLUCOPHAGE) 500 MG tablet  Take 500 mg by mouth 2 (two) times daily with a meal.    [provider]  mometasone-formoterol (DULERA) 100-5 MCG/ACT AERO Inhale 2 puffs into the lungs 2 (two) times daily. 08/10/19   Ngetich, Dinah C, NP  ondansetron (ZOFRAN) 4 MG tablet Take 4 mg by mouth every 8 (eight) hours as needed. 05/13/20   [provider]  pantoprazole (PROTONIX) 40 MG tablet Take 1 tablet (40 mg total) by mouth 2 (two) times daily for 14 days. 09/15/20 09/29/20  Lavonia Drafts, MD  potassium chloride SA (KLOR-CON) 20 MEQ tablet Take 1 tablet (20 mEq total) by mouth 2 (two) times daily. 07/23/20   Esterwood, Amy S, PA-C  promethazine (PROMETHEGAN) 12.5 MG suppository Place 1 suppository (12.5 mg total) rectally every 6 (six) hours as needed for nausea or vomiting. 08/16/20   Lavonia Drafts, MD  SUMAtriptan (IMITREX) 100 MG tablet TAKE 1 TABLET BY MOUTH 2X DAILY AS NEEDED FOR MIGRAINE. MAY REPEAT IN 2 HOURS IF HEADACHE PERSISTS OR RECURS. 12/11/19   Kathrynn Ducking, MD  traMADol (ULTRAM) 50 MG tablet Take 50 mg by mouth every 6 (six) hours as needed for moderate pain.  03/27/20   [provider]  traZODone (DESYREL) 50 MG tablet Take 1 tablet (50 mg total) by mouth at bedtime as needed for sleep. 10/03/20   Salley Slaughter, NP    Allergies    Aspirin  Review of Systems   Review of Systems  All other systems reviewed and are negative.   Physical Exam Updated Vital Signs BP (!) 138/96   Pulse (!) 50   Temp 98.7 F (37.1 C) (Oral)   Resp 15   LMP 02/19/2016   SpO2 100%   Physical Exam Vitals and nursing note reviewed. Exam conducted with a chaperone present.  Constitutional:      Appearance: Normal appearance. She is not ill-appearing.  HENT:     Head: Normocephalic and atraumatic.  Eyes:     General: No scleral icterus.    Conjunctiva/sclera: Conjunctivae normal.  Cardiovascular:     Rate and Rhythm: Regular rhythm. Bradycardia present.     Pulses: Normal pulses.     Heart  sounds: Normal heart sounds.  Pulmonary:     Effort: Pulmonary effort is normal. No respiratory distress.     Breath sounds: Normal breath sounds. No wheezing or rales.  Musculoskeletal:        General: Normal range of motion.  Skin:    General: Skin is dry.     Capillary Refill: Capillary refill takes less than 2 seconds.  Neurological:     General: No focal deficit present.     Mental Status: She is alert and oriented to person, place, and time.     GCS: GCS eye subscore is 4. GCS verbal subscore is 5. GCS motor subscore is 6.     Cranial Nerves: No cranial nerve deficit.     Sensory: No sensory deficit.     Motor: No weakness.     Coordination: Coordination normal.     Gait: Gait normal.  Psychiatric:        Mood and Affect: Mood normal.        Behavior: Behavior normal.        Thought Content: Thought content normal.     ED Results / Procedures / Treatments   Labs (all labs ordered are listed, but only  abnormal results are displayed) Labs Reviewed  BASIC METABOLIC PANEL - Abnormal; Notable for the following components:      Result Value   Potassium 3.2 (*)    BUN 5 (*)    Creatinine, Ser 1.02 (*)    All other components within normal limits  CBC  I-STAT BETA HCG BLOOD, ED ([facility], [facility], [facility] ONLY)  TROPONIN I (HIGH SENSITIVITY)  TROPONIN I (HIGH SENSITIVITY)    EKG EKG Interpretation  Date/Time:  Saturday October 25 2020 08:31:10 EDT Ventricular Rate:  60 PR Interval:  132 QRS Duration: 84 QT Interval:  436 QTC Calculation: 436 R Axis:   60 Text Interpretation: Normal sinus rhythm Nonspecific ST and T wave abnormality Abnormal ECG No significant change since last tracing Confirmed by Isla Pence [phone]) on 10/25/2020 8:37:30 AM   Radiology DG Chest 2 View  Result Date: 10/25/2020 CLINICAL DATA:  Chest pain. EXAM: CHEST - 2 VIEW COMPARISON:  June 04, 2020 FINDINGS: The heart size and mediastinal contours are within normal limits. Both lungs are clear. The  visualized skeletal structures are unremarkable. IMPRESSION: No active cardiopulmonary disease. Electronically Signed   By: Dorise Bullion III M.D   On: 10/25/2020 09:24    Procedures Procedures (including critical care time)  Medications Ordered in ED Medications  potassium chloride SA (KLOR-CON) CR tablet 40 mEq (has no administration in time range)    ED Course  I have reviewed the triage vital signs and the nursing notes.  Pertinent labs & imaging results that were available during my care of the patient were reviewed by me and considered in my medical decision making (see chart for details).    MDM Rules/Calculators/A&P                          Patient had return to the ED this morning because she is concerned because she did not receive her entire potassium replacement in the setting of her known hypokalemia. She suspects that her lightheadedness and fatigue is related to her electrolyte derangement. Her physical exam is benign. Given her onset of chest pain this morning, will obtain cardiac work-up including chest x-ray and troponin. We will also recheck her laboratory work-up to ensure improvement of her potassium, particularly since she claims that she vomited her oral dose provided yesterday in the ED.  Labs Troponin: 2 >> 2 CBC: No anemia, hemoglobin has improved when compared to labs obtained yesterday. No leukocytosis. Beta-hCG: Less than 5. BMP: Mild hypokalemia 3.2, improved when compared to labs obtained yesterday.  Imaging Plain films are personally reviewed and demonstrate no acute cardiopulmonary disease.  EKG is reviewed and demonstrates nonspecific ST and T wave abnormalities, but otherwise NSR.  Her labs have improved since yesterday and she appears to be in no acute distress. Will administer an additional 40 mEq K-Dur here in the ED and encouraged her to follow-up outpatient with her primary care provider. I will also refer her to cardiology given her chest  pain in the context of abnormal EKG. Her story of chest pain would be atypical for ACS. She is PERC negative.  She has mild right-sided anterior chest wall TTP.  Denies any musculoskeletal injury, however her reproducibility could be suggestive of muscular discomfort.  Her physical exam is entirely unremarkable and she is neurovascular intact.  Suspect that her lightheadedness could have been related to her dehydration from yesterday.  She admits that she has not been eating and drinking well.  Will provide her something to eat and drink here in the ED with her potassium supplement prior to discharge.  Encouraging over-the-counter NSAIDs as needed for chest discomfort.  All of the evaluation and work-up results were discussed with the patient and any family at bedside.  Patient and/or family were informed that while patient is appropriate for discharge at this time, some medical emergencies may only develop or become detectable after a period of time.  I specifically instructed patient and/or family to return to return to the ED or seek immediate medical attention for any new or worsening symptoms.  They were provided opportunity to ask any additional questions and have none at this time.  Prior to discharge patient is feeling well, agreeable with plan for discharge home.  They have expressed understanding of verbal discharge instructions as well as return precautions and are agreeable to the plan.    Final Clinical Impression(s) / ED Diagnoses Final diagnoses:  Nonspecific chest pain    Rx / DC Orders ED Discharge Orders    None       Corena Herter, PA-C 10/25/20 1156    Corena Herter, PA-C 10/25/20 1201    Isla Pence, MD 10/25/20 1212

## 2020-10-25 NOTE — Discharge Instructions (Addendum)
Your work-up today was reassuring.  Your potassium and anemia has improved when compared to labs obtained yesterday.  Please continue take your medications at home, as directed.  I recommend that you take over-the-counter NSAIDs as needed for your symptoms of chest discomfort.  I would like you to follow-up with your primary care provider on Monday or Tuesday regarding your ER encounters from this weekend.  I would also like for you to call [facility] to schedule an appointment for ongoing evaluation and management of your chest discomfort and lightheadedness if your symptoms fail to improve with conservative management.   Return to the ED or seek immediate medical attention should you experience any new or worsening symptoms.

## 2020-10-27 ENCOUNTER — Other Ambulatory Visit: Payer: Self-pay | Admitting: Internal Medicine

## 2020-10-27 ENCOUNTER — Other Ambulatory Visit
Admission: RE | Admit: 2020-10-27 | Discharge: 2020-10-27 | Disposition: A | Discharge status: Home / Self Care | Payer: 59 | Source: Ambulatory Visit | Attending: Internal Medicine | Admitting: Internal Medicine

## 2020-10-27 ENCOUNTER — Other Ambulatory Visit: Payer: Self-pay

## 2020-10-27 ENCOUNTER — Ambulatory Visit
Admission: RE | Admit: 2020-10-27 | Discharge: 2020-10-27 | Disposition: A | Discharge status: Home / Self Care | Payer: 59 | Source: Ambulatory Visit | Attending: Internal Medicine | Admitting: Internal Medicine

## 2020-10-27 DIAGNOSIS — R0789 Other chest pain: Secondary | ICD-10-CM | POA: Insufficient documentation

## 2020-10-27 DIAGNOSIS — R071 Chest pain on breathing: Secondary | ICD-10-CM

## 2020-10-27 DIAGNOSIS — I2699 Other pulmonary embolism without acute cor pulmonale: Secondary | ICD-10-CM | POA: Diagnosis present

## 2020-10-27 DIAGNOSIS — I2609 Other pulmonary embolism with acute cor pulmonale: Secondary | ICD-10-CM | POA: Insufficient documentation

## 2020-10-27 LAB — FIBRIN DERIVATIVES D-DIMER (ARMC ONLY): Fibrin derivatives D-dimer (ARMC): 369.34 ng/mL (FEU) (ref 0.00–499.00)

## 2020-10-27 MED ORDER — IOHEXOL 350 MG/ML SOLN
75.0000 mL | Freq: Once | INTRAVENOUS | Status: AC | PRN
  Administered 2020-10-27: 75 mL via INTRAVENOUS

## 2020-11-03 ENCOUNTER — Other Ambulatory Visit: Payer: Self-pay

## 2020-11-03 ENCOUNTER — Ambulatory Visit
Admission: RE | Admit: 2020-11-03 | Discharge: 2020-11-03 | Disposition: A | Discharge status: Home / Self Care | Payer: 59 | Source: Ambulatory Visit | Attending: Physical Medicine & Rehabilitation | Admitting: Physical Medicine & Rehabilitation

## 2020-11-03 ENCOUNTER — Encounter: Payer: Self-pay | Admitting: Physical Medicine & Rehabilitation

## 2020-11-03 DIAGNOSIS — M5416 Radiculopathy, lumbar region: Secondary | ICD-10-CM | POA: Diagnosis present

## 2020-11-03 DIAGNOSIS — M5412 Radiculopathy, cervical region: Secondary | ICD-10-CM | POA: Insufficient documentation

## 2020-11-21 ENCOUNTER — Ambulatory Visit: Payer: 59 | Admitting: Internal Medicine

## 2020-11-21 NOTE — Progress Notes (Deleted)
Subjective:    Patient ID: Victoria Moore, female    DOB: 03-11-1976      MRN: [id number]    Brief patient profile:  [id number] waitress quit smoking 2014 with no resp complaints at wt around 280 told she had ? Sarcoid by CT Chest in 2015 with new cough/sob plus arthritis mostly in ankles  and sensation of sensitive skin treated with cycles of prednisone referred to pulmonary clinic 04/15/2016 by Dr Charlotte Crumb     History of Present Illness  04/15/2016 1st [facility] office visit/ Meron Bocchino   Chief Complaint  Patient presents with  . Pulmonary Consult    Referred by Dr. Lujean Amel. 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 She c/o SOB since then, esp worse in the past 2 months. SOB comes and goes, and is worse with exertion. She sometimes wakes up in the night with SOB. She has occ cough- sometimes prod with min yellow sputum.    1 y h/o persistent daily doe x every time across the room - occ happens at rest   Cough brought on by smells/ assoc with mild dysphagia on nexium 40 mg daily / sometimes coughs so hard hurts in center of chest transient only  Can't lie flat x one year , ok propped up  No better on dulera 100 though inhaler she brought was on 0 and using just samples so far on 2 bid basis with very poor hfa - see a/p Breathing improves p mom's neb saba  rec Continue dulera 100 Take 2 puffs first thing in am and then another 2 puffs about 12 hours later.  Work on inhaler technique:  relax and gently blow all the way out then take a nice smooth deep breath back in, triggering the inhaler at same time you start breathing in.  Hold for up to 5 seconds if you can. Blow out thru nose. Rinse and gargle with water when done Continue nexium 40 mg   Take  30-60 min before first meal of the day and Pepcid (famotidine)  20 mg one in evening or  toward bedtime until return to office - this is the best way to tell whether stomach acid is contributing to your problem.   GERD diet       10/22/2016   Extended f/u ov/Lasharn Bufkin to re-establish re: : ? Sarcoidosis / new back pain p fell 09/29/16  Chief Complaint  Patient presents with  . Acute Visit    Pt c/o joint pain "all over"- esp in her back and started approx 1 month ago. Pt also having non prod cough. She has noticed some blurred vision. She states she gets SOB just walking up any sort of incline.   onset of arthritis esp feet and ankles summer 2017 rash   Doe = MMRC1 = can walk nl pace, flat grade, can't hurry or go uphills or steps s sob   Cough is day > noct/ cough and sob better on dulera 100 despite poor hfa and only 2 at hs rec Increase the dulera 100 to Take 2 puffs first thing in am and then another 2 puffs about 12 hours later.  Work on inhaler technique:  Please remember to go to the lab and x-ray department downstairs for your tests - we will call you with the results when they are available. Please schedule a follow up office visit in 2 weeks, sooner if needed with pfts at [facility] - bring all active medications/ inhalers >  did not do    11/10/2016  f/u ov/Namari Breton re: ? Sarcoid related joint complaints  Chief Complaint  Patient presents with  . Follow-up    Still having joint back and back pain. Her cough and SOB have improved. No new co's today.    completely eliminated all symptoms on prednisone, joint pain  came back 3-4 days later but cough/ sob did not  rec Tylenol arthritis as needed Please see patient coordinator before you leave today  to schedule rheumatologist / Dr Charlestine Night       11/21/2020  f/u ov/Teion Ballin re: sarcoid / ? Asthma  No chief complaint on file.    Dyspnea:  *** Cough: *** Sleeping: *** SABA use: *** 02: ***   No obvious day to day or daytime variability or assoc excess/ purulent sputum or mucus plugs or hemoptysis or cp or chest tightness, subjective wheeze or overt sinus or hb symptoms.   *** without nocturnal  or early am exacerbation  of respiratory  c/o's or need for noct  saba. Also denies any obvious fluctuation of symptoms with weather or environmental changes or other aggravating or alleviating factors except as outlined above   No unusual exposure hx or h/o childhood pna/ asthma or knowledge of premature birth.  Current Allergies, Complete Past Medical History, Past Surgical History, Family History, and Social History were reviewed in [employer] record.  ROS  The following are not active complaints unless bolded Hoarseness, sore throat, dysphagia, dental problems, itching, sneezing,  nasal congestion or discharge of excess mucus or purulent secretions, ear ache,   fever, chills, sweats, unintended wt loss or wt gain, classically pleuritic or exertional cp,  orthopnea pnd or arm/hand swelling  or leg swelling, presyncope, palpitations, abdominal pain, anorexia, nausea, vomiting, diarrhea  or change in bowel habits or change in bladder habits, change in stools or change in urine, dysuria, hematuria,  rash, arthralgias, visual complaints, headache, numbness, weakness or ataxia or problems with walking or coordination,  change in mood or  memory.        No outpatient medications have been marked as taking for the 11/21/20 encounter (Appointment) with Tanda Rockers, MD.   Current Facility-Administered Medications for the 11/21/20 encounter (Appointment) with Tanda Rockers, MD  Medication  . nitroGLYCERIN (NITROSTAT) SL tablet 0.4 mg                     Objective:   Physical Exam     11/21/2020       *** 11/10/2016       285  10/22/2016      287   04/15/16 276 lb (125.193 kg)  03/11/16 270 lb (122.471 kg)  03/02/16 270 lb (122.471 kg)     Vital signs reviewed  11/21/2020  - Note at rest 02 sats  ***% on ***                       Assessment & Plan:

## 2020-11-27 ENCOUNTER — Telehealth (HOSPITAL_COMMUNITY): Payer: Self-pay | Admitting: Clinical

## 2020-11-27 ENCOUNTER — Other Ambulatory Visit: Payer: Self-pay

## 2020-11-27 ENCOUNTER — Ambulatory Visit (HOSPITAL_COMMUNITY): Payer: Self-pay | Admitting: Clinical

## 2020-11-27 NOTE — Telephone Encounter (Signed)
Therapist sent the client a link via MyChart text message for the scheduled virtual visit. Client did not check in using the link. Therapist follow up by calling the tele -phone to attempt contact. Client did not respond. Therapist left a voice mail with the office phone number for her to call and reschedule.

## 2020-12-16 ENCOUNTER — Telehealth (HOSPITAL_COMMUNITY): Payer: Self-pay | Admitting: *Deleted

## 2020-12-16 NOTE — Telephone Encounter (Signed)
Called requesting her Adderall be called in to her preferred pharmacy. Will bring this concern to her attention. She hasnt had it for one month.

## 2020-12-17 ENCOUNTER — Other Ambulatory Visit (INDEPENDENT_AMBULATORY_CARE_PROVIDER_SITE_OTHER): Payer: 59 | Admitting: Physician Assistant

## 2020-12-17 ENCOUNTER — Telehealth (HOSPITAL_COMMUNITY): Payer: Self-pay | Admitting: *Deleted

## 2020-12-17 DIAGNOSIS — F900 Attention-deficit hyperactivity disorder, predominantly inattentive type: Secondary | ICD-10-CM | POA: Diagnosis not present

## 2020-12-17 MED ORDER — AMPHETAMINE-DEXTROAMPHET ER 20 MG PO CP24: 20.0000 mg | ORAL_CAPSULE | ORAL | 0 refills | Status: DC | PRN

## 2020-12-17 NOTE — Telephone Encounter (Signed)
Prescription filled and e-prescribed to preferred pharmacy of choice.

## 2020-12-17 NOTE — Telephone Encounter (Signed)
Called late yesterday to request an Adderall rx be called in for her. States she called me last week with the same request and I didn't follow thru, though I dont remember speaking with her. Her provider is out of the office the rest of the week, will ask the [place] PA who is here today to call it in to her preferred pharmacy.

## 2020-12-17 NOTE — Progress Notes (Signed)
Victoria Moore is a 44 year old female with a past psychiatric history significant for social anxiety disorder, ADHD, and major depression disorder who is being followed by Salley Slaughter, NP. Patient called [facility] to request an Adderall prescription since she is currently out. Prescription being placed and e-prescribed to pharmacy of choice.

## 2020-12-23 ENCOUNTER — Other Ambulatory Visit (HOSPITAL_COMMUNITY): Payer: Self-pay | Admitting: Psychiatry

## 2020-12-23 DIAGNOSIS — F900 Attention-deficit hyperactivity disorder, predominantly inattentive type: Secondary | ICD-10-CM

## 2020-12-23 NOTE — Telephone Encounter (Signed)
Medication refilled by Maurine Simmering on 12/17/2020

## 2020-12-31 ENCOUNTER — Encounter (HOSPITAL_COMMUNITY): Payer: Self-pay

## 2020-12-31 ENCOUNTER — Other Ambulatory Visit: Payer: Self-pay

## 2020-12-31 ENCOUNTER — Emergency Department (HOSPITAL_COMMUNITY)
Admission: EM | Admit: 2020-12-31 | Discharge: 2020-12-31 | Disposition: A | Discharge status: Home / Self Care | Payer: HRSA Program | Attending: Emergency Medicine | Admitting: Emergency Medicine

## 2020-12-31 DIAGNOSIS — U071 COVID-19: Secondary | ICD-10-CM | POA: Diagnosis not present

## 2020-12-31 DIAGNOSIS — R509 Fever, unspecified: Secondary | ICD-10-CM | POA: Diagnosis present

## 2020-12-31 DIAGNOSIS — M791 Myalgia, unspecified site: Secondary | ICD-10-CM | POA: Diagnosis not present

## 2020-12-31 DIAGNOSIS — Z5321 Procedure and treatment not carried out due to patient leaving prior to being seen by health care provider: Secondary | ICD-10-CM | POA: Insufficient documentation

## 2020-12-31 NOTE — ED Triage Notes (Signed)
Pt reports chills, fever, and body aches beginning yesterday.

## 2020-12-31 NOTE — ED Notes (Signed)
Pt sts she would watch results on mychart and follow up as needed. Instructed to return for worsening symptoms.

## 2021-01-01 ENCOUNTER — Ambulatory Visit (HOSPITAL_COMMUNITY)
Admission: EM | Admit: 2021-01-01 | Discharge: 2021-01-01 | Disposition: A | Discharge status: Home / Self Care | Payer: Self-pay | Attending: Student | Admitting: Student

## 2021-01-01 ENCOUNTER — Encounter (HOSPITAL_COMMUNITY): Payer: Self-pay

## 2021-01-01 DIAGNOSIS — J069 Acute upper respiratory infection, unspecified: Secondary | ICD-10-CM

## 2021-01-01 LAB — SARS CORONAVIRUS 2 (TAT 6-24 HRS): SARS Coronavirus 2: POSITIVE — AB

## 2021-01-01 MED ORDER — PROMETHAZINE-DM 6.25-15 MG/5ML PO SYRP: 5.0000 mL | ORAL_SOLUTION | Freq: Four times a day (QID) | ORAL | 0 refills | Status: DC | PRN

## 2021-01-01 MED ORDER — BENZONATATE 100 MG PO CAPS: 100.0000 mg | ORAL_CAPSULE | Freq: Three times a day (TID) | ORAL | 0 refills | Status: DC

## 2021-01-01 MED ORDER — DM-GUAIFENESIN ER 30-600 MG PO TB12: 1.0000 | ORAL_TABLET | Freq: Two times a day (BID) | ORAL | 0 refills | Status: DC

## 2021-01-01 MED ORDER — METAXALONE 800 MG PO TABS: 800.0000 mg | ORAL_TABLET | Freq: Three times a day (TID) | ORAL | 0 refills | Status: DC

## 2021-01-01 NOTE — Discharge Instructions (Addendum)
-  Tessalon as needed for cough. Take one pill up to 3x daily (every 8 hours) -Promethazine DM cough syrup for congestion/cough. This could make you drowsy, so take at night before bed. -Mucinex DM for congestion/cough during the day -Keep using your Albuterol and Dulera inhalers as directed.  -Keep using the Zofran that you have at home for nausea/vomiting. Make sure to drink plenty of water and eat healthy food.  -Please purchase pulse-ox monitor. These can be purchased for about $15 at the pharmacy. Monitor your oxygen several times daily. If it drops below 94%, or you are feeling short of breath, seek additional medical attention.  -For fevers/chills, body aches, headaches- use Tylenol and Ibuprofen. You can alternate these for maximum effect. Use up to 3000mg  Tylenol daily and 3200mg  Ibuprofen daily. Make sure to take ibuprofen with food. Check the bottle of ibuprofen/tylenol for specific dosage instructions.   We are currently awaiting result of your PCR covid-19 test. This typically comes back in 1-2 days. Please isolate at home while awaiting these results. If your test is positive for Covid-19, continue to isolate at home for 5 days if you have mild symptoms, or a total of 10 days from symptom onset if you have more severe symptoms. If you quarantine for a shorter period of time (i.e. 5 days), make sure to wear a mask until day 10 of symptoms. Treat your symptoms at home with OTC remedies like tylenol/ibuprofen, mucinex, nyquil, etc. Seek medical attention if you develop high fevers, chest pain, shortness of breath, ear pain, facial pain, etc. Make sure to get up and move around every 2-3 hours while convalescing to help prevent blood clots. Drink plenty of fluids, and rest as much as possible.

## 2021-01-01 NOTE — ED Triage Notes (Signed)
Pt presents with cough, chills, fever and bodyaches X 2 days. She states she has loss her sense of taste. Pt states she had a fever this morning. Pt states she has taken tylenol for relief. She states her COVID test results are still pending.

## 2021-01-01 NOTE — ED Provider Notes (Signed)
[place]    CSN: [id number] Arrival date & time: 01/01/21  1300      History   Chief Complaint Chief Complaint  Patient presents with  . Cough  . Chills  . Fever  . Generalized Body Aches    HPI Victoria Moore is a 45 y.o. female Presenting for URI symptoms for 2 days. History of ADHD, allergies, anemia, anxiety, arthritis, chest wall pain, diabetes, hypertension, migraine, sarcoidosis, sleep apnea. Presenting today with cough, chills, fever up to 101.8 at home, bodyaches, loss of taste/smell x2 days. Currently awaiting covid test results- was tested at the ED 1 day ago but left without being seen. Taking albuterol, dulera inhalers with good control of sarcoidosis. Taking tylenol. Endorses pain in side and chest wall when coughing but denies chest pain at rest, pain down arms, jaw pain. Denies n/v/d, shortness of breath, chest pain, facial pain, teeth pain, headaches, sore throat, ear pain. Denies chest pain, shortness of breath, confusion, high fevers. Fully vaccinated for covid-19.   HPI  Past Medical History:  Diagnosis Date  . Acute low back pain without sciatica 06/22/2018  . ADHD   . Allergy   . Anemia   . Anxiety   . Arthritis   . 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 Fibromyalgia, doubt sarcoid   . Asthma   . Blood transfusion without reported diagnosis   . Chest wall pain 05/26/2018  . Chronic kidney disease   . Chronic low back pain with sciatica 09/15/2018  . Common migraine with intractable migraine 10/07/2016  . Cough variant asthma vs UACS  10/23/2016   - Spirometry 04/15/2016  Very truncated exp loop effort dep portion only  - Allergy profile 10/22/2016 >  Eos 0.2/  IgE  52 RAST POS grass/trees/ragweed  10/22/2016  After extensive coaching HFA effectiveness =    75% try duelra 100 2bid > improved  . Daytime sleepiness 05/26/2018  . Decreased pedal pulses 10/04/2018  . Depression   . Depression,  major, single episode, moderate ([place]) 05/26/2018  . Diabetes ([place])   . Dyspnea 04/15/2016   04/15/2016  Walked RA x 3 laps @ 185 ft each stopped due to  End of study, nl pace, no sob or desat    - Spirometry 04/15/2016  Very truncated exp loop effort dep portion only  - 10/22/2016  Walked RA x 3 laps @ 185 ft each stopped due to  End of study, slow pace, min sob/ no desat - full pfts rec 10/22/2016 >>>      . Edema   . Essential hypertension, benign 11/15/2016  . Fibroids 03/11/2016  . Frequency of urination 09/11/2018  . GERD (gastroesophageal reflux disease)   . Herniated lumbar intervertebral disc   . Hiatal hernia   . Hilar adenopathy   . Hypertension   . Hypokalemia 06/28/2018  . Impaired fasting blood sugar 09/11/2018  . Iron deficiency   . Leg pain 12/07/2018  . Low libido 11/27/2018  . Migraine   . Mild sleep apnea 08/03/2018   HST 06/20/18  AHI  8.1 / snoring with 02 nadir 80% >  08/03/2018 rec sleep medicine consultation   . Morbid (severe) obesity due to excess calories ([place]) 10/23/2016  . Personal history of sarcoidosis 05/26/2018  . Prolonged capillary refill time   . Right leg swelling 08/18/2018  . Sarcoidosis    personal history of  . Seizures ([place])  history of   . Sleep apnea   . Spondylosis   . Vitamin D deficiency     Patient Active Problem List   Diagnosis Date Noted  . Social anxiety disorder 10/03/2020  . Attention deficit hyperactivity disorder (ADHD), 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  . Mild episode of recurrent major depressive disorder ([place]) 10/03/2020  . Constipation 05/20/2020  . S/P gastric bypass 05/12/2020  . Intractable abdominal pain 04/27/2020  . Benign intracranial hypertension 02/14/2020  . Myopia of both eyes 02/14/2020  . 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 BMI 45.0-49.9, adult ([place]) 11/29/2019  . Other intervertebral disc degeneration, lumbar region 09/19/2019  . Complex cyst of left ovary 02/01/2019  . Sarcoidosis 02/01/2019  .  Leg pain 12/07/2018  . Low libido 11/27/2018  . Prolonged capillary refill time 10/04/2018  . Decreased pedal pulses 10/04/2018  . Spondylosis 09/15/2018  . Chronic low back pain with sciatica 09/15/2018  . Impaired fasting blood sugar 09/11/2018  . Weight gain 09/11/2018  . Mild sleep apnea 08/03/2018  . Hypokalemia 06/28/2018  . Medication side effect 06/22/2018  . Acute low back pain without sciatica 06/22/2018  . Personal history of sarcoidosis 05/26/2018  . Vitamin D deficiency 05/26/2018  . Encounter for health maintenance examination with abnormal findings 05/26/2018  . Depression, major, single episode, moderate ([place]) 05/26/2018  . Daytime sleepiness 05/26/2018  . Chest pain of uncertain etiology [id number]  . GERD (gastroesophageal reflux disease) 05/26/2018  . Essential hypertension, benign 11/15/2016  . 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 Morbid (severe) obesity due to excess calories ([place]) 10/23/2016  . Common migraine with intractable migraine 10/07/2016  . DOE (dyspnea on exertion) 04/15/2016  . Hilar adenopathy 04/15/2016  . Seizures ([place]) 03/11/2016  . Anemia 11/12/2014  . Iron deficiency 11/12/2014    Past Surgical History:  Procedure Laterality Date  . ABDOMINAL HYSTERECTOMY    . ESOPHAGEAL MANOMETRY N/A 09/05/2019   Procedure: ESOPHAGEAL MANOMETRY (EM);  Surgeon: Lavena Bullion, DO;  Location: [facility];  Service: Gastroenterology;  Laterality: N/A;  . LAPAROSCOPIC OVARIAN CYSTECTOMY Left 03/11/2016   Procedure: LAPAROSCOPIC OVARIAN CYSTECTOMY;  Surgeon: Eldred Manges, MD;  Location: [facility];  Service: Gynecology;  Laterality: Left;  . LAPAROSCOPIC VAGINAL HYSTERECTOMY WITH SALPINGECTOMY Bilateral 03/11/2016   Procedure: LAPAROSCOPIC ASSISTED VAGINAL HYSTERECTOMY WITH SALPINGECTOMY;  Surgeon: Eldred Manges, MD;  Location: [facility];  Service: Gynecology;  Laterality: Bilateral;  . [place] IMPEDANCE STUDY  09/05/2019   Procedure: [place] IMPEDANCE STUDY;   Surgeon: Lavena Bullion, DO;  Location: [facility];  Service: Gastroenterology;;  . TUBAL LIGATION    . UPPER GASTROINTESTINAL ENDOSCOPY  10/11/2019   06/2019    OB History    Gravida  1   Para      Term      Preterm      AB      Living        SAB      IAB      Ectopic      Multiple      Live Births               Home Medications    Prior to Admission medications   Medication Sig Start Date End Date Taking? Authorizing Provider  benzonatate (TESSALON) 100 MG capsule Take 1 capsule (100 mg total) by mouth every 8 (eight) hours. 01/01/21  Yes Hazel Sams, PA-C  dextromethorphan-guaiFENesin [facility] DM) 30-600 MG 12hr tablet Take 1 tablet by mouth  2 (two) times daily. 01/01/21  Yes Hazel Sams, PA-C  promethazine-dextromethorphan (PROMETHAZINE-DM) 6.25-15 MG/5ML syrup Take 5 mLs by mouth 4 (four) times daily as needed for cough. 01/01/21  Yes Hazel Sams, PA-C  ACETAMINOPHEN EXTRA STRENGTH 500 MG tablet Take 1,000 mg by mouth every 6 (six) hours as needed for pain. 04/08/20   [provider]  albuterol (PROVENTIL) (2.5 MG/3ML) 0.083% nebulizer solution Take 2.5 mg by nebulization every 6 (six) hours as needed for wheezing or shortness of breath.    [provider]  albuterol (VENTOLIN HFA) 108 (90 Base) MCG/ACT inhaler Inhale 1-2 puffs into the lungs every 4 (four) hours as needed for wheezing or shortness of breath. 01/14/20   Parrett, Fonnie Mu, NP  ALPRAZolam (XANAX) 0.25 MG tablet Take 1 tablet (0.25 mg total) by mouth 2 (two) times daily as needed for anxiety. 10/03/20   Salley Slaughter, NP  amphetamine-dextroamphetamine (ADDERALL XR) 20 MG 24 hr capsule Take 1 capsule (20 mg total) by mouth as needed. 12/17/20   Nwoko, Terese Door, PA  ferrous sulfate (FER-IN-SOL) 75 (15 Fe) MG/ML SOLN Take 0.8 mLs (60 mg total) by mouth 2 (two) times daily. With food 07/22/20 08/21/20  Esterwood, Amy S, PA-C  fluticasone furoate-vilanterol (BREO ELLIPTA)  100-25 MCG/INH AEPB Inhale 1 puff into the lungs daily. 10/14/20   Tanda Rockers, MD  Fluticasone-Salmeterol (ADVAIR) 100-50 MCG/DOSE AEPB TAKE 1 PUFF BY MOUTH TWICE A DAY 10/16/20   Parrett, Fonnie Mu, NP  GAVILAX 17 GM/SCOOP powder Take 17 g by mouth daily. 05/20/20   [provider]  HYDROcodone-acetaminophen (NORCO/VICODIN) 5-325 MG tablet Take 1 tablet by mouth every 4 (four) hours as needed for moderate pain. 08/16/20   Lavonia Drafts, MD  metFORMIN (GLUCOPHAGE) 500 MG tablet Take 500 mg by mouth 2 (two) times daily with a meal.    [provider]  mometasone-formoterol (DULERA) 100-5 MCG/ACT AERO Inhale 2 puffs into the lungs 2 (two) times daily. 08/10/19   Ngetich, Dinah C, NP  ondansetron (ZOFRAN) 4 MG tablet Take 4 mg by mouth every 8 (eight) hours as needed. 05/13/20   [provider]  pantoprazole (PROTONIX) 40 MG tablet Take 1 tablet (40 mg total) by mouth 2 (two) times daily for 14 days. 09/15/20 09/29/20  Lavonia Drafts, MD  potassium chloride SA (KLOR-CON) 20 MEQ tablet Take 1 tablet (20 mEq total) by mouth 2 (two) times daily. 07/23/20   Esterwood, Amy S, PA-C  promethazine (PROMETHEGAN) 12.5 MG suppository Place 1 suppository (12.5 mg total) rectally every 6 (six) hours as needed for nausea or vomiting. 08/16/20   Lavonia Drafts, MD  SUMAtriptan (IMITREX) 100 MG tablet TAKE 1 TABLET BY MOUTH 2X DAILY AS NEEDED FOR MIGRAINE. MAY REPEAT IN 2 HOURS IF HEADACHE PERSISTS OR RECURS. 12/11/19   Kathrynn Ducking, MD  traMADol (ULTRAM) 50 MG tablet Take 50 mg by mouth every 6 (six) hours as needed for moderate pain.  03/27/20   [provider]  traZODone (DESYREL) 50 MG tablet Take 1 tablet (50 mg total) by mouth at bedtime as needed for sleep. 10/03/20   Salley Slaughter, NP  chlorthalidone (HYGROTON) 25 MG tablet TAKE 1 TABLET BY MOUTH EVERY DAY Patient taking differently: Take 25 mg by mouth daily.  11/05/19 01/01/21  Ngetich, Nelda Bucks, NP    Family  History Family History  Adopted: Yes  Problem Relation Age of Onset  . Other Son        Building services engineer  pains  . Asthma Mother   . Heart Problems Mother   . Migraines Mother   . COPD Mother   . Heart attack Mother   . Diabetes Father   . Peptic Ulcer Father   . Heart Problems Father   . COPD Father   . Heart attack Father   . Colon cancer Neg Hx   . Colon polyps Neg Hx   . Esophageal cancer Neg Hx   . Rectal cancer Neg Hx   . Stomach cancer Neg Hx     Social History Social History   Tobacco Use  . Smoking status: Former Smoker    Packs/day: 0.25    Years: 17.00    Pack years: 4.25    Types: Cigarettes    Quit date: 03/02/2013    Years since quitting: 7.8  . Smokeless tobacco: Never Used  Vaping Use  . Vaping Use: Never used  Substance Use Topics  . Alcohol use: No    Alcohol/week: 0.0 standard drinks  . Drug use: Not Currently    Types: Marijuana    Comment: former - 6 yrs ago     Allergies   Aspirin   Review of Systems Review of Systems  Constitutional: Positive for chills and fever. Negative for appetite change.  HENT: Positive for congestion and sore throat. Negative for ear pain, rhinorrhea, sinus pressure, sinus pain and tinnitus.   Eyes: Negative for redness and visual disturbance.  Respiratory: Positive for cough. Negative for chest tightness, shortness of breath and wheezing.   Cardiovascular: Negative for chest pain and palpitations.  Gastrointestinal: Positive for diarrhea. Negative for abdominal pain, constipation, nausea and vomiting.  Genitourinary: Negative for dysuria, frequency and urgency.  Musculoskeletal: Positive for myalgias.  Neurological: Negative for dizziness, weakness and headaches.  Psychiatric/Behavioral: Negative for confusion.  All other systems reviewed and are negative.    Physical Exam Triage Vital Signs ED Triage Vitals  Enc Vitals Group     BP 01/01/21 1444 119/77     Pulse Rate 01/01/21 1444 82     Resp 01/01/21 1444 18      Temp 01/01/21 1444 99.3 F (37.4 C)     Temp Source 01/01/21 1444 Oral     SpO2 01/01/21 1444 98 %     Weight --      Height --      Head Circumference --      Peak Flow --      Pain Score 01/01/21 1443 10     Pain Loc --      Pain Edu? --      Excl. in [place]? --    No data found.  Updated Vital Signs BP 119/77 (BP Location: Right Arm)   Pulse 82   Temp 99.3 F (37.4 C) (Oral)   Resp 18   LMP 02/19/2016   SpO2 98%   Visual Acuity Right Eye Distance:   Left Eye Distance:   Bilateral Distance:    Right Eye Near:   Left Eye Near:    Bilateral Near:     Physical Exam Vitals reviewed.  Constitutional:      General: She is not in acute distress.    Appearance: Normal appearance. She is ill-appearing.  HENT:     Head: Normocephalic and atraumatic.     Right Ear: Hearing, tympanic membrane, ear canal and external ear normal. No swelling or tenderness. There is no impacted cerumen. No mastoid tenderness. Tympanic membrane is not perforated, erythematous, retracted or bulging.  Left Ear: Hearing, tympanic membrane, ear canal and external ear normal. No swelling or tenderness. There is no impacted cerumen. No mastoid tenderness. Tympanic membrane is not perforated, erythematous, retracted or bulging.     Nose:     Right Sinus: No maxillary sinus tenderness or frontal sinus tenderness.     Left Sinus: No maxillary sinus tenderness or frontal sinus tenderness.     Mouth/Throat:     Mouth: Mucous membranes are moist.     Pharynx: Uvula midline. No oropharyngeal exudate or posterior oropharyngeal erythema.     Tonsils: No tonsillar exudate.  Cardiovascular:     Rate and Rhythm: Normal rate and regular rhythm.     Heart sounds: Normal heart sounds.  Pulmonary:     Effort: Pulmonary effort is normal. No tachypnea, accessory muscle usage or respiratory distress.     Breath sounds: Normal breath sounds and air entry. No wheezing, rhonchi or rales.     Comments: Occ dry  cough Chest:     Chest wall: No tenderness.     Comments: Tenderness to palpation along sternal border Abdominal:     General: Abdomen is flat. Bowel sounds are normal.     Tenderness: There is abdominal tenderness in the epigastric area. There is no right CVA tenderness, left CVA tenderness, guarding or rebound. Negative signs include Murphy's sign, Rovsing's sign and McBurney's sign.  Musculoskeletal:     Right lower leg: No edema.     Left lower leg: No edema.     Comments: Diffuse tenderness to palpation along R external oblique muscle and L sternal border.   Lymphadenopathy:     Cervical: No cervical adenopathy.  Neurological:     General: No focal deficit present.     Mental Status: She is alert and oriented to person, place, and time.  Psychiatric:        Attention and Perception: Attention and perception normal.        Mood and Affect: Mood and affect normal.        Behavior: Behavior normal. Behavior is cooperative.        Thought Content: Thought content normal.        Judgment: Judgment normal.      UC Treatments / Results  Labs (all labs ordered are listed, but only abnormal results are displayed) Labs Reviewed - No data to display  EKG   Radiology No results found.  Procedures Procedures (including critical care time)  Medications Ordered in UC Medications - No data to display  Initial Impression / Assessment and Plan / UC Course  I have reviewed the triage vital signs and the nursing notes.  Pertinent labs & imaging results that were available during my care of the patient were reviewed by me and considered in my medical decision making (see chart for details).     She declines covid test as she had one done at [facility] 1 day ago that she is waiting on.  Patient is fully vaccinated for covid-19. Isolation precautions per CDC guidelines until negative result. Symptomatic relief with OTC Mucinex, Nyquil, etc. Return precautions- new/worsening  fevers/chills, shortness of breath, chest pain, abd pain, etc.   Pt with sarcoidosis that is currently well controlled.  afebrile nontachycardic nontachypneic, oxygenating well on room air and benign lung exam. -Please purchase pulse-ox monitor. These can be purchased for about $15 at the pharmacy. Monitor your oxygen several times daily. If it drops below 94%, or you are feeling short of breath,  seek additional medical attention.    Tessalon for cough; mucinex DM and promethazine DM for cough/congestion   Final Clinical Impressions(s) / UC Diagnoses   Final diagnoses:  Acute upper respiratory infection     Discharge Instructions     -Tessalon as needed for cough. Take one pill up to 3x daily (every 8 hours) -Promethazine DM cough syrup for congestion/cough. This could make you drowsy, so take at night before bed. -Mucinex DM for congestion/cough during the day -Keep using your Albuterol and Dulera inhalers as directed.  -Keep using the Zofran that you have at home for nausea/vomiting. Make sure to drink plenty of water and eat healthy food.  -Please purchase pulse-ox monitor. These can be purchased for about $15 at the pharmacy. Monitor your oxygen several times daily. If it drops below 94%, or you are feeling short of breath, seek additional medical attention.    We are currently awaiting result of your PCR covid-19 test. This typically comes back in 1-2 days. Please isolate at home while awaiting these results. If your test is positive for Covid-19, continue to isolate at home for 5 days if you have mild symptoms, or a total of 10 days from symptom onset if you have more severe symptoms. If you quarantine for a shorter period of time (i.e. 5 days), make sure to wear a mask until day 10 of symptoms. Treat your symptoms at home with OTC remedies like tylenol/ibuprofen, mucinex, nyquil, etc. Seek medical attention if you develop high fevers, chest pain, shortness of breath, ear pain, facial  pain, etc. Make sure to get up and move around every 2-3 hours while convalescing to help prevent blood clots. Drink plenty of fluids, and rest as much as possible.     ED Prescriptions    Medication Sig Dispense Auth. Provider   promethazine-dextromethorphan (PROMETHAZINE-DM) 6.25-15 MG/5ML syrup Take 5 mLs by mouth 4 (four) times daily as needed for cough. 118 mL Hazel Sams, PA-C   benzonatate (TESSALON) 100 MG capsule Take 1 capsule (100 mg total) by mouth every 8 (eight) hours. 21 capsule Hazel Sams, PA-C   dextromethorphan-guaiFENesin [facility] DM) 30-600 MG 12hr tablet Take 1 tablet by mouth 2 (two) times daily. 30 tablet Hazel Sams, PA-C   metaxalone (SKELAXIN) 800 MG tablet  (Status: Discontinued) Take 1 tablet (800 mg total) by mouth 3 (three) times daily. 21 tablet Hazel Sams, PA-C     PDMP not reviewed this encounter.   Hazel Sams, PA-C 01/01/21 1544

## 2021-01-02 ENCOUNTER — Other Ambulatory Visit: Payer: Self-pay | Admitting: Internal Medicine

## 2021-01-02 ENCOUNTER — Other Ambulatory Visit: Payer: Self-pay | Admitting: Adult Health

## 2021-01-02 ENCOUNTER — Telehealth (HOSPITAL_COMMUNITY): Payer: Self-pay

## 2021-01-02 ENCOUNTER — Telehealth: Payer: Self-pay | Admitting: Unknown Physician Specialty

## 2021-01-02 ENCOUNTER — Other Ambulatory Visit: Payer: Self-pay | Admitting: Physician Assistant

## 2021-01-02 ENCOUNTER — Other Ambulatory Visit: Payer: Self-pay | Admitting: Infectious Diseases

## 2021-01-02 MED ORDER — NIRMATRELVIR/RITONAVIR (PAXLOVID)TABLET: 3.0000 | ORAL_TABLET | Freq: Two times a day (BID) | ORAL | 0 refills | Status: DC

## 2021-01-02 MED ORDER — NIRMATRELVIR/RITONAVIR (PAXLOVID)TABLET: 3.0000 | ORAL_TABLET | Freq: Two times a day (BID) | ORAL | 0 refills | Status: AC

## 2021-01-02 MED ORDER — NIRMATRELVIR/RITONAVIR (PAXLOVID)TABLET: 2.0000 | ORAL_TABLET | Freq: Two times a day (BID) | ORAL | 0 refills | Status: DC

## 2021-01-02 NOTE — Addendum Note (Signed)
Addended by: Agawam Callas on: 01/02/2021 03:34 PM   Modules accepted: Orders

## 2021-01-02 NOTE — Telephone Encounter (Signed)
Patient was prescribed oral covid treatment Paxlovid and treatment note was reviewed. Medication has been received by [name] and reviewed for appropriateness.  Drug Interactions or Dosage Adjustments Noted: Use Alprazolam and Trazodone sparingly if needed during Paxlovid therapy. No dosage adjustment of Paxlovid is necessary.  Delivery Method: pick up  Patient contacted for counseling on 01/02/21 and verbalized understanding.   Delivery or Pick-Up Date: 01/02/21   [name] 01/02/2021, 4:07 PM [facility] Out[facility] Pharmacist Phone# [phone]

## 2021-01-02 NOTE — Progress Notes (Signed)
Correction to previous dose of Paxlovid. Pt dose not require reduced dose as her Cr Clearance is >60. Rx amended.   Alan Ripper, [place]

## 2021-01-02 NOTE — Telephone Encounter (Signed)
Called to discuss with patient about COVID-19 symptoms and the use of one of the available treatments for those with mild to moderate Covid symptoms and at a high risk of hospitalization.  Pt appears to qualify for outpatient treatment due to co-morbid conditions and/or a member of an at-risk group in accordance with the FDA Emergency Use Authorization.    Symptom onset: 1/5 Vaccinated: yes Qualifiers: BMI, DM, high SVI  Number give to presriber  Kathrine Haddock

## 2021-01-02 NOTE — Progress Notes (Signed)
Outpatient Oral COVID Treatment Note  I connected with Victoria Moore on 01/02/2021/11:58 AM by telephone and verified that I am speaking with the correct person using two identifiers.  I discussed the limitations, risks, security, and privacy concerns of performing an evaluation and management service by telephone and the availability of in person appointments. I also discussed with the patient that there may be a patient responsible charge related to this service. The patient expressed understanding and agreed to proceed.  Patient location: home Provider location: phone visit  Diagnosis: COVID-19 infection  Purpose of visit: Discussion of potential use of Molnupiravir or Paxlovid, a new treatment for mild to moderate COVID-19 viral infection in non-hospitalized patients.   Subjective: Patient is a 45 y.o. female who has been diagnosed with COVID 19 viral infection.  Their symptoms began on 1/5 with fever and cough.    Past Medical History:  Diagnosis Date  . Acute low back pain without sciatica 06/22/2018  . ADHD   . Allergy   . Anemia   . Anxiety   . Arthritis   . 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 Fibromyalgia, doubt sarcoid   . Asthma   . Blood transfusion without reported diagnosis   . Chest wall pain 05/26/2018  . Chronic kidney disease   . Chronic low back pain with sciatica 09/15/2018  . Common migraine with intractable migraine 10/07/2016  . Cough variant asthma vs UACS  10/23/2016   - Spirometry 04/15/2016  Very truncated exp loop effort dep portion only  - Allergy profile 10/22/2016 >  Eos 0.2/  IgE  52 RAST POS grass/trees/ragweed  10/22/2016  After extensive coaching HFA effectiveness =    75% try duelra 100 2bid > improved  . Daytime sleepiness 05/26/2018  . Decreased pedal pulses 10/04/2018  . Depression   . Depression, major, single episode, moderate ([place]) 05/26/2018  . Diabetes ([place])   . Dyspnea 04/15/2016   04/15/2016   Walked RA x 3 laps @ 185 ft each stopped due to  End of study, nl pace, no sob or desat    - Spirometry 04/15/2016  Very truncated exp loop effort dep portion only  - 10/22/2016  Walked RA x 3 laps @ 185 ft each stopped due to  End of study, slow pace, min sob/ no desat - full pfts rec 10/22/2016 >>>      . Edema   . Essential hypertension, benign 11/15/2016  . Fibroids 03/11/2016  . Frequency of urination 09/11/2018  . GERD (gastroesophageal reflux disease)   . Herniated lumbar intervertebral disc   . Hiatal hernia   . Hilar adenopathy   . Hypertension   . Hypokalemia 06/28/2018  . Impaired fasting blood sugar 09/11/2018  . Iron deficiency   . Leg pain 12/07/2018  . Low libido 11/27/2018  . Migraine   . Mild sleep apnea 08/03/2018   HST 06/20/18  AHI  8.1 / snoring with 02 nadir 80% >  08/03/2018 rec sleep medicine consultation   . Morbid (severe) obesity due to excess calories ([employer]) 10/23/2016  . Personal history of sarcoidosis 05/26/2018  . Prolonged capillary refill time   . Right leg swelling 08/18/2018  . Sarcoidosis    personal history of  . Seizures ([place])    history of   . Sleep apnea   . Spondylosis   . Vitamin D deficiency     Allergies  Allergen Reactions  . Aspirin Anaphylaxis  and Hives         (Not in a hospital admission)   Current Outpatient Medications  Medication Sig Dispense Refill Last Dose  . nirmatrelvir/ritonavir EUA (PAXLOVID) TABS Take 2 tablets by mouth 2 (two) times daily for 5 days. Take nirmatrelvir (150 mg) 1 tablet(s) twice daily for 5 days and ritonavir (100 mg) one tablet twice daily for 5 days. 20 tablet 0   . ACETAMINOPHEN EXTRA STRENGTH 500 MG tablet Take 1,000 mg by mouth every 6 (six) hours as needed for pain.     Marland Kitchen albuterol (PROVENTIL) (2.5 MG/3ML) 0.083% nebulizer solution Take 2.5 mg by nebulization every 6 (six) hours as needed for wheezing or shortness of breath.     Marland Kitchen albuterol (VENTOLIN HFA) 108 (90 Base) MCG/ACT inhaler Inhale 1-2 puffs into  the lungs every 4 (four) hours as needed for wheezing or shortness of breath. 8 g 3   . ALPRAZolam (XANAX) 0.25 MG tablet Take 1 tablet (0.25 mg total) by mouth 2 (two) times daily as needed for anxiety. 60 tablet 0   . amphetamine-dextroamphetamine (ADDERALL XR) 20 MG 24 hr capsule Take 1 capsule (20 mg total) by mouth as needed. 30 capsule 0   . benzonatate (TESSALON) 100 MG capsule Take 1 capsule (100 mg total) by mouth every 8 (eight) hours. 21 capsule 0   . dextromethorphan-guaiFENesin (MUCINEX DM) 30-600 MG 12hr tablet Take 1 tablet by mouth 2 (two) times daily. 30 tablet 0   . ferrous sulfate (FER-IN-SOL) 75 (15 Fe) MG/ML SOLN Take 0.8 mLs (60 mg total) by mouth 2 (two) times daily. With food 50 mL 0   . GAVILAX 17 GM/SCOOP powder Take 17 g by mouth daily.     Marland Kitchen HYDROcodone-acetaminophen (NORCO/VICODIN) 5-325 MG tablet Take 1 tablet by mouth every 4 (four) hours as needed for moderate pain. 20 tablet 0   . metFORMIN (GLUCOPHAGE) 500 MG tablet Take 500 mg by mouth 2 (two) times daily with a meal.     . mometasone-formoterol (DULERA) 100-5 MCG/ACT AERO Inhale 2 puffs into the lungs 2 (two) times daily. 13 g 3   . ondansetron (ZOFRAN) 4 MG tablet Take 4 mg by mouth every 8 (eight) hours as needed.     . pantoprazole (PROTONIX) 40 MG tablet Take 1 tablet (40 mg total) by mouth 2 (two) times daily for 14 days. 28 tablet 0   . potassium chloride SA (KLOR-CON) 20 MEQ tablet Take 1 tablet (20 mEq total) by mouth 2 (two) times daily. 10 tablet 0   . promethazine (PROMETHEGAN) 12.5 MG suppository Place 1 suppository (12.5 mg total) rectally every 6 (six) hours as needed for nausea or vomiting. 12 each 0   . promethazine-dextromethorphan (PROMETHAZINE-DM) 6.25-15 MG/5ML syrup Take 5 mLs by mouth 4 (four) times daily as needed for cough. 118 mL 0   . SUMAtriptan (IMITREX) 100 MG tablet TAKE 1 TABLET BY MOUTH 2X DAILY AS NEEDED FOR MIGRAINE. MAY REPEAT IN 2 HOURS IF HEADACHE PERSISTS OR RECURS. 9 tablet 3    . traMADol (ULTRAM) 50 MG tablet Take 50 mg by mouth every 6 (six) hours as needed for moderate pain.      . traZODone (DESYREL) 50 MG tablet Take 1 tablet (50 mg total) by mouth at bedtime as needed for sleep. 50 tablet 2    Current Facility-Administered Medications  Medication Dose Route Frequency Provider Last Rate Last Admin  . nitroGLYCERIN (NITROSTAT) SL tablet 0.4 mg  0.4 mg Sublingual Q5 min  PRN Ngetich, Dinah C, NP   0.4 mg at 09/14/19 1621     Objective: Patient appears/sounds unwell, but in no apparent distress.  Breathing is non labored.  Mood and behavior are normal.  Laboratory Data:  Recent Results (from the past 2160 hour(s))  Basic metabolic panel     Status: Abnormal   Collection Time: 10/24/20  6:13 PM  Result Value Ref Range   Sodium 139 135 - 145 mmol/L   Potassium 2.5 (LL) 3.5 - 5.1 mmol/L    Comment: CRITICAL RESULT CALLED TO, READ BACK BY AND VERIFIED WITH: SIMMS MARVA RN [id number] PHILLIPS C    Chloride 99 98 - 111 mmol/L   CO2 28 22 - 32 mmol/L   Glucose, Bld 81 70 - 99 mg/dL    Comment: Glucose reference range applies only to samples taken after fasting for at least 8 hours.   BUN 7 6 - 20 mg/dL   Creatinine, Ser 1.02 (H) 0.44 - 1.00 mg/dL   Calcium 8.5 (L) 8.9 - 10.3 mg/dL   GFR, Estimated >60 >60 mL/min    Comment: (NOTE) Calculated using the CKD-EPI Creatinine Equation (2021)    Anion gap 12 5 - 15    Comment: Performed at [facility], [address] [place], [place] [address]  CBC with Differential     Status: Abnormal   Collection Time: 10/24/20  6:13 PM  Result Value Ref Range   WBC 4.3 4.0 - 10.5 K/uL   RBC 3.94 3.87 - 5.11 MIL/uL   Hemoglobin 11.3 (L) 12.0 - 15.0 g/dL   HCT 35.1 (L) 36.0 - 46.0 %   MCV 89.1 80.0 - 100.0 fL   MCH 28.7 26.0 - 34.0 pg   MCHC 32.2 30.0 - 36.0 g/dL   RDW 12.9 11.5 - 15.5 %   Platelets 181 150 - 400 K/uL   nRBC 0.0 0.0 - 0.2 %   Neutrophils Relative % 46 %   Neutro Abs 2.0 1.7 - 7.7 K/uL    Lymphocytes Relative 42 %   Lymphs Abs 1.8 0.7 - 4.0 K/uL   Monocytes Relative 7 %   Monocytes Absolute 0.3 0.1 - 1.0 K/uL   Eosinophils Relative 4 %   Eosinophils Absolute 0.2 0.0 - 0.5 K/uL   Basophils Relative 1 %   Basophils Absolute 0.0 0.0 - 0.1 K/uL   Immature Granulocytes 0 %   Abs Immature Granulocytes 0.01 0.00 - 0.07 K/uL    Comment: Performed at [facility], [address] [place], [place] [address]  Magnesium     Status: None   Collection Time: 10/24/20  6:13 PM  Result Value Ref Range   Magnesium 2.0 1.7 - 2.4 mg/dL    Comment: Performed at [facility], [address] [place], [place] [address]  Urinalysis, Routine w reflex microscopic     Status: Abnormal   Collection Time: 10/24/20  6:14 PM  Result Value Ref Range   Color, Urine YELLOW YELLOW   APPearance CLOUDY (A) CLEAR   Specific Gravity, Urine >1.030 (H) 1.005 - 1.030   pH 6.0 5.0 - 8.0   Glucose, UA NEGATIVE NEGATIVE mg/dL   Hgb urine dipstick NEGATIVE NEGATIVE   Bilirubin Urine SMALL (A) NEGATIVE   Ketones, ur 15 (A) NEGATIVE mg/dL   Protein, ur NEGATIVE NEGATIVE mg/dL   Nitrite NEGATIVE NEGATIVE   Leukocytes,Ua NEGATIVE NEGATIVE    Comment: Microscopic not done on urines with negative  protein, blood, leukocytes, nitrite, or glucose < 500 mg/dL. Performed at [facility], [address] [place], [place] [address]   Basic metabolic panel     Status: Abnormal   Collection Time: 10/25/20  8:34 AM  Result Value Ref Range   Sodium 140 135 - 145 mmol/L   Potassium 3.2 (L) 3.5 - 5.1 mmol/L   Chloride 105 98 - 111 mmol/L   CO2 27 22 - 32 mmol/L   Glucose, Bld 74 70 - 99 mg/dL    Comment: Glucose reference range applies only to samples taken after fasting for at least 8 hours.   BUN 5 (L) 6 - 20 mg/dL   Creatinine, Ser 1.02 (H) 0.44 - 1.00 mg/dL   Calcium 8.9 8.9 - 10.3 mg/dL   GFR, Estimated >60 >60 mL/min    Comment: (NOTE) Calculated using the CKD-EPI Creatinine  Equation (2021)    Anion gap 8 5 - 15    Comment: Performed at [facility] [address], [place] [address]  CBC     Status: None   Collection Time: 10/25/20  8:34 AM  Result Value Ref Range   WBC 4.4 4.0 - 10.5 K/uL   RBC 4.26 3.87 - 5.11 MIL/uL   Hemoglobin 12.3 12.0 - 15.0 g/dL   HCT 38.7 36.0 - 46.0 %   MCV 90.8 80.0 - 100.0 fL   MCH 28.9 26.0 - 34.0 pg   MCHC 31.8 30.0 - 36.0 g/dL   RDW 13.2 11.5 - 15.5 %   Platelets 200 150 - 400 K/uL   nRBC 0.0 0.0 - 0.2 %    Comment: Performed at [facility], [place] [address], [place], [place] [address]  Troponin I (High Sensitivity)     Status: None   Collection Time: 10/25/20  8:34 AM  Result Value Ref Range   Troponin I (High Sensitivity) <2 <18 ng/L    Comment: (NOTE) Elevated high sensitivity troponin I (hsTnI) values and significant  changes across serial measurements may suggest ACS but many other  chronic and acute conditions are known to elevate hsTnI results.  Refer to the "Links" section for chest pain algorithms and additional  guidance. Performed at [facility], [place] [address], [place], [place] [address]   I-Stat beta hCG blood, ED     Status: None   Collection Time: 10/25/20  8:46 AM  Result Value Ref Range   I-stat hCG, quantitative <5.0 <5 mIU/mL   Comment 3            Comment:   GEST. AGE      CONC.  (mIU/mL)   <=1 WEEK        5 - 50     2 WEEKS       50 - 500     3 WEEKS       100 - 10,000     4 WEEKS     1,000 - 30,000        FEMALE AND NON-PREGNANT FEMALE:     LESS THAN 5 mIU/mL   Troponin I (High Sensitivity)     Status: None   Collection Time: 10/25/20 11:11 AM  Result Value Ref Range   Troponin I (High Sensitivity) <2 <18 ng/L    Comment: (NOTE) Elevated high sensitivity troponin I (hsTnI) values and significant  changes across serial measurements may suggest ACS but many other  chronic and acute conditions are known to elevate  hsTnI results.  Refer to the "Links" section  for chest pain algorithms and additional  guidance. Performed at [facility], [place] [address], [place], [place] [address]   Fibrin derivatives [facility] only)     Status: None   Collection Time: 10/27/20 12:30 PM  Result Value Ref Range   Fibrin derivatives D-dimer ([facility]) 369.34 0.00 - 499.00 ng/mL (FEU)    Comment: (NOTE) <> Exclusion of Venous Thromboembolism (VTE) - OUTPATIENT ONLY   (Emergency Department or [place])    0-499 ng/ml (FEU): With a low to intermediate pretest probability                      for VTE this test result excludes the diagnosis                      of VTE.   >499 ng/ml (FEU) : VTE not excluded; additional work up for VTE is                      required.  <> Testing on Inpatients and Evaluation of Disseminated Intravascular   Coagulation (DIC) Reference Range:   0-499 ng/ml (FEU) Performed at [facility], [address] [place], [place] [address]   SARS CORONAVIRUS 2 (TAT 6-24 HRS) Nasopharyngeal Nasopharyngeal Swab     Status: Abnormal   Collection Time: 12/31/20  8:50 PM   Specimen: Nasopharyngeal Swab  Result Value Ref Range   SARS Coronavirus 2 POSITIVE (A) NEGATIVE    Comment: Emailed Lori Berdik @ 2344 on 01/01/2021 by Braxton Feathers (NOTE) SARS-CoV-2 target nucleic acids are DETECTED.  The SARS-CoV-2 RNA is generally detectable in upper and lower respiratory specimens during the acute phase of infection. Positive results are indicative of the presence of SARS-CoV-2 RNA. Clinical correlation with patient history and other diagnostic information is  necessary to determine patient infection status. Positive results do not rule out bacterial infection or co-infection with other viruses.  The expected result is Negative.  Fact Sheet for Patients: SugarRoll.be  Fact Sheet for Healthcare Providers: https://www.woods-mathews.com/  This test is not yet approved or cleared by the [place]  FDA and  has been authorized for detection and/or diagnosis of SARS-CoV-2 by FDA under an Emergency Use Authorization (EUA). This EUA will remain  in effect (meaning this test can be used) for the duration of the COVID-19 d eclaration under Section 564(b)(1) of the Act, 21 U.S.C. section 360bbb-3(b)(1), unless the authorization is terminated or revoked sooner.   Performed at [facility], [place] [address] [place], [place] [address]      Assessment: 45 y.o. female with mild/moderate COVID 19 viral infection diagnosed on 1/6 at high risk for progression to severe COVID 19.  Plan:  This patient is a 45 y.o. female that meets the following criteria for Emergency Use Authorization of: Paxlovid 1. Age >12 yr AND > 40 kg 2. SARS-COV-2 positive test 3. Symptom onset < 5 days 4. Mild-to-moderate COVID disease with high risk for severe progression to hospitalization or death  I have spoken and communicated the following to the patient or parent/caregiver regarding: 1. Paxlovid is an unapproved drug that is authorized for use under an Emergency Use Authorization.  2. There are no adequate, approved, available products for the treatment of COVID-19 in adults who have mild-to-moderate COVID-19 and are at high risk for progressing to severe COVID-19, including hospitalization or death. 3. Other therapeutics are  currently authorized. For additional information on all products authorized for treatment or prevention of COVID-19, please see TanEmporium.pl.  4. There are benefits and risks of taking this treatment as outlined in the "Fact Sheet for Patients and Caregivers."  5. "Fact Sheet for Patients and Caregivers" was reviewed with patient. A hard copy will be provided to patient from pharmacy prior to the patient receiving treatment. 6. Patients should continue to self-isolate and use  infection control measures (e.g., wear mask, isolate, social distance, avoid sharing personal items, clean and disinfect "high touch" surfaces, and frequent handwashing) according to CDC guidelines.  7. The patient or parent/caregiver has the option to accept or refuse treatment. 8. Patient medication history was reviewed for potential drug interactions:No drug interactions. States she is no longer taking Dulera 9. Patient's creatinine clearance was calculated to be 39, and they were therefore prescribed Reduced dose (CrCl 30-60) - nirmatrelvir 150mg  tab (1 tablet) by mouth twice daily AND ritonavir 100mg  tab (1 tablet) by mouth twice daily   After reviewing above information with the patient, the patient agrees to receive Paxlovid.  Follow up instructions:    . Take prescription BID x 5 days as directed . Reach out to pharmacist for counseling on medication if desired . For concerns regarding further COVID symptoms please follow up with your PCP or urgent care . For urgent or life-threatening issues, seek care at your local emergency department  The patient was provided an opportunity to ask questions, and all were answered. The patient agreed with the plan and demonstrated an understanding of the instructions.   Script sent to [facility] and opted to pick up RX.  The patient was advised to call their PCP or seek an in-person evaluation if the symptoms worsen or if the condition fails to improve as anticipated.   I provided 45 minutes of non face-to-face telephone visit time during this encounter, and > 50% was spent counseling as documented under my assessment & plan.  Alan Ripper, [place] Back

## 2021-01-02 NOTE — Progress Notes (Signed)
I spoke with [name] PharmD at [facility] outpatient pharmacy regarding Victoria Moore's Paxlovid prescription.  After both verifying independently her creatinine clearance of 123, she does not need dose reduced paxlovid per the providers note.  I asked pharmacy to go ahead and fill the full dosing of the script.    Wilber Bihari, NP

## 2021-01-02 NOTE — Progress Notes (Signed)
Please see previous documentation for further detail re: rx. Sent to wrong pharmacy   CrCl > 96 mL/min

## 2021-01-05 ENCOUNTER — Telehealth (HOSPITAL_COMMUNITY): Payer: 59 | Admitting: Psychiatry

## 2021-01-16 ENCOUNTER — Ambulatory Visit: Payer: Self-pay | Admitting: Physician Assistant

## 2021-01-22 ENCOUNTER — Ambulatory Visit: Payer: Self-pay

## 2021-01-23 ENCOUNTER — Ambulatory Visit (INDEPENDENT_AMBULATORY_CARE_PROVIDER_SITE_OTHER): Payer: HRSA Program | Admitting: Adult Health

## 2021-01-23 ENCOUNTER — Ambulatory Visit (HOSPITAL_COMMUNITY)
Admission: EM | Admit: 2021-01-23 | Discharge status: Against Medical Advice | Payer: Self-pay | Source: Home / Self Care

## 2021-01-23 ENCOUNTER — Other Ambulatory Visit: Payer: Self-pay

## 2021-01-23 ENCOUNTER — Encounter: Payer: Self-pay | Admitting: Adult Health

## 2021-01-23 DIAGNOSIS — U071 COVID-19: Secondary | ICD-10-CM | POA: Diagnosis not present

## 2021-01-23 DIAGNOSIS — J45909 Unspecified asthma, uncomplicated: Secondary | ICD-10-CM | POA: Insufficient documentation

## 2021-01-23 DIAGNOSIS — J4531 Mild persistent asthma with (acute) exacerbation: Secondary | ICD-10-CM

## 2021-01-23 MED ORDER — ALBUTEROL SULFATE HFA 108 (90 BASE) MCG/ACT IN AERS
1.0000 | INHALATION_SPRAY | RESPIRATORY_TRACT | 3 refills | Status: DC | PRN
Start: 2021-01-23 — End: 2024-03-25

## 2021-01-23 MED ORDER — BECLOMETHASONE DIPROPIONATE 80 MCG/ACT IN AERS: 2.0000 | INHALATION_SPRAY | Freq: Two times a day (BID) | RESPIRATORY_TRACT | 6 refills | Status: DC

## 2021-01-23 NOTE — Progress Notes (Signed)
@Patient  ID: Victoria Moore, female    DOB: April 17, 1976, 45 y.o.   MRN: [id number]  Chief Complaint  Patient presents with  . Follow-up    Referring provider: [name], MD  HPI: 45 yo female former smoker followed for cough variant asthma  Mild OSA on CPAP .  Morbid obesity s/p Gastric Bypass 03/2020- lost 100lbs    TEST/EVENTS :  See CT 11/12/14 R hilar/ subcarinal : These are not significant by size criteria (size not reported)  - f/u cxr 02/03/2016 no vz adenopathy and nl on 10/22/2016  - ACE 10/22/2016 = 22  - PFTs 10/25/16 FVC 3.28 (85%) s obstruction and dlco 70 corrects to 96% -HST 05/2018 >AHI 8.1/hr CT chest November 2015 small right hilar and subcarinal lymph nodes no PE. CT chest 10/27/2020 - PE, no enlarged mediastinal hilar or axillary lymph nodes. Lungs are clear.  01/23/2021 Follow up : Asthma and Covid 19  Patient presents for a follow-up visit. Patient has known cough variant asthma Patient says she was doing well until the last month when she developed COVID-19 earlier this month. She had acute respiratory symptoms. She was given antiviral Paxlovid . She says she is improving but continues to have some ongoing cough and chest tightness with intermittent wheezing. Needs a refill of her albuterol. She also is out of her [place]he Mannan as she has no insurance. She will be getting insurance next week. And needs a replacement of Dulera. She has tried Advair, Symbicort and did not like these inhalers in the past. She does not like dry powder inhalers.. She was seen by her primary care doctor and prescribed a Z-Pak and prednisone which she started yesterday. She is using Mucinex and cough syrup to help with cough. She denies any hemoptysis, chest pain, orthopnea, calf pain, leg swelling Says she had a chest x-ray yesterday that showed bronchitic changes. No records available for today's visit.   Allergies  Allergen Reactions  . Aspirin Anaphylaxis and Hives          Immunization History  Administered Date(s) Administered  . Influenza-Unspecified 08/03/2019  . PFIZER(Purple Top)SARS-COV-2 Vaccination 09/03/2020, 09/24/2020  . PPD Test 04/20/2017  . Pneumococcal Polysaccharide-23 11/13/2014  . Td 10/18/2015  . Tdap 05/08/2014, 01/15/2016    Past Medical History:  Diagnosis Date  . Acute low back pain without sciatica 06/22/2018  . ADHD   . Allergy   . Anemia   . Anxiety   . Arthritis   . 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 Fibromyalgia, doubt sarcoid   . Asthma   . Blood transfusion without reported diagnosis   . Chest wall pain 05/26/2018  . Chronic kidney disease   . Chronic low back pain with sciatica 09/15/2018  . Common migraine with intractable migraine 10/07/2016  . Cough variant asthma vs UACS  10/23/2016   - Spirometry 04/15/2016  Very truncated exp loop effort dep portion only  - Allergy profile 10/22/2016 >  Eos 0.2/  IgE  52 RAST POS grass/trees/ragweed  10/22/2016  After extensive coaching HFA effectiveness =    75% try duelra 100 2bid > improved  . Daytime sleepiness 05/26/2018  . Decreased pedal pulses 10/04/2018  . Depression   . Depression, major, single episode, moderate ([place]) 05/26/2018  . Diabetes ([place])   . Dyspnea 04/15/2016   04/15/2016  Walked RA x 3 laps @ 185 ft each stopped due to  End of study, nl  pace, no sob or desat    - Spirometry 04/15/2016  Very truncated exp loop effort dep portion only  - 10/22/2016  Walked RA x 3 laps @ 185 ft each stopped due to  End of study, slow pace, min sob/ no desat - full pfts rec 10/22/2016 >>>      . Edema   . Essential hypertension, benign 11/15/2016  . Fibroids 03/11/2016  . Frequency of urination 09/11/2018  . GERD (gastroesophageal reflux disease)   . Herniated lumbar intervertebral disc   . Hiatal hernia   . Hilar adenopathy   . Hypertension   . Hypokalemia 06/28/2018  . Impaired fasting blood sugar 09/11/2018  . Iron  deficiency   . Leg pain 12/07/2018  . Low libido 11/27/2018  . Migraine   . Mild sleep apnea 08/03/2018   HST 06/20/18  AHI  8.1 / snoring with 02 nadir 80% >  08/03/2018 rec sleep medicine consultation   . Morbid (severe) obesity due to excess calories ([place]) 10/23/2016  . Personal history of sarcoidosis 05/26/2018  . Prolonged capillary refill time   . Right leg swelling 08/18/2018  . Sarcoidosis    personal history of  . Seizures ([place])    history of   . Sleep apnea   . Spondylosis   . Vitamin D deficiency     Tobacco History: Social History   Tobacco Use  Smoking Status Former Smoker  . Packs/day: 0.25  . Years: 17.00  . Pack years: 4.25  . Types: Cigarettes  . Quit date: 03/02/2013  . Years since quitting: 7.9  Smokeless Tobacco Never Used   Counseling given: Not Answered   Outpatient Medications Prior to Visit  Medication Sig Dispense Refill  . ACETAMINOPHEN EXTRA STRENGTH 500 MG tablet Take 1,000 mg by mouth every 6 (six) hours as needed for pain.    Marland Kitchen albuterol (PROVENTIL) (2.5 MG/3ML) 0.083% nebulizer solution Take 2.5 mg by nebulization every 6 (six) hours as needed for wheezing or shortness of breath.    . ALPRAZolam (XANAX) 0.25 MG tablet Take 1 tablet (0.25 mg total) by mouth 2 (two) times daily as needed for anxiety. 60 tablet 0  . amphetamine-dextroamphetamine (ADDERALL XR) 20 MG 24 hr capsule Take 1 capsule (20 mg total) by mouth as needed. 30 capsule 0  . azithromycin (ZITHROMAX) 250 MG tablet Take 2 tablets (500mg ) by mouth on Day 1. Take 1 tablet (250mg ) by mouth on Days 2-5.    Marland Kitchen dextromethorphan-guaiFENesin (MUCINEX DM) 30-600 MG 12hr tablet Take 1 tablet by mouth 2 (two) times daily. 30 tablet 0  . GAVILAX 17 GM/SCOOP powder Take 17 g by mouth daily.    Marland Kitchen guaiFENesin-codeine (ROBITUSSIN AC) 100-10 MG/5ML syrup Take by mouth.    . metFORMIN (GLUCOPHAGE) 500 MG tablet Take 500 mg by mouth 2 (two) times daily with a meal.    . ondansetron (ZOFRAN) 4 MG tablet Take  4 mg by mouth every 8 (eight) hours as needed.    . potassium chloride SA (KLOR-CON) 20 MEQ tablet Take 1 tablet (20 mEq total) by mouth 2 (two) times daily. 10 tablet 0  . promethazine (PROMETHEGAN) 12.5 MG suppository Place 1 suppository (12.5 mg total) rectally every 6 (six) hours as needed for nausea or vomiting. 12 each 0  . promethazine-dextromethorphan (PROMETHAZINE-DM) 6.25-15 MG/5ML syrup Take 5 mLs by mouth 4 (four) times daily as needed for cough. 118 mL 0  . SUMAtriptan (IMITREX) 100 MG tablet TAKE 1 TABLET BY MOUTH 2X DAILY  AS NEEDED FOR MIGRAINE. MAY REPEAT IN 2 HOURS IF HEADACHE PERSISTS OR RECURS. 9 tablet 3  . traMADol (ULTRAM) 50 MG tablet Take 50 mg by mouth every 6 (six) hours as needed for moderate pain.     . traZODone (DESYREL) 50 MG tablet Take 1 tablet (50 mg total) by mouth at bedtime as needed for sleep. 50 tablet 2  . albuterol (VENTOLIN HFA) 108 (90 Base) MCG/ACT inhaler Inhale 1-2 puffs into the lungs every 4 (four) hours as needed for wheezing or shortness of breath. 8 g 3  . ferrous sulfate (FER-IN-SOL) 75 (15 Fe) MG/ML SOLN Take 0.8 mLs (60 mg total) by mouth 2 (two) times daily. With food 50 mL 0  . HYDROcodone-acetaminophen (NORCO/VICODIN) 5-325 MG tablet Take 1 tablet by mouth every 4 (four) hours as needed for moderate pain. (Patient not taking: Reported on 01/23/2021) 20 tablet 0  . pantoprazole (PROTONIX) 40 MG tablet Take 1 tablet (40 mg total) by mouth 2 (two) times daily for 14 days. 28 tablet 0  . benzonatate (TESSALON) 100 MG capsule Take 1 capsule (100 mg total) by mouth every 8 (eight) hours. (Patient not taking: Reported on 01/23/2021) 21 capsule 0   Facility-Administered Medications Prior to Visit  Medication Dose Route Frequency Provider Last Rate Last Admin  . nitroGLYCERIN (NITROSTAT) SL tablet 0.4 mg  0.4 mg Sublingual Q5 min PRN Ngetich, Dinah C, NP   0.4 mg at 09/14/19 1621     Review of Systems:   Constitutional:   No  weight loss, night  sweats,  Fevers, chills, fatigue, or  lassitude.  HEENT:   No headaches,  Difficulty swallowing,  Tooth/dental problems, or  Sore throat,                No sneezing, itching, ear ache, +nasal congestion, post nasal drip,   CV:  No chest pain,  Orthopnea, PND, swelling in lower extremities, anasarca, dizziness, palpitations, syncope.   GI  No heartburn, indigestion, abdominal pain, nausea, vomiting, diarrhea, change in bowel habits, loss of appetite, bloody stools.   Resp:  .  No chest wall deformity  Skin: no rash or lesions.  GU: no dysuria, change in color of urine, no urgency or frequency.  No flank pain, no hematuria   MS:  No joint pain or swelling.  No decreased range of motion.  No back pain.    Physical Exam  BP 122/84 (BP Location: Left Arm, Patient Position: Sitting, Cuff Size: Normal)   Pulse 66   Temp 98.3 F (36.8 C) (Temporal)   Ht 5\' 10"  (1.778 m)   Wt 214 lb 3.2 oz (97.2 kg)   LMP 02/19/2016   SpO2 100%   BMI 30.73 kg/m   GEN: A/Ox3; pleasant , NAD, well nourished    HEENT:  [place]/AT,   , NOSE-clear, THROAT-clear, no lesions, no postnasal drip or exudate noted.   NECK:  Supple w/ fair ROM; no JVD; normal carotid impulses w/o bruits; no thyromegaly or nodules palpated; no lymphadenopathy.    RESP  Clear  P & A; w/o, wheezes/ rales/ or rhonchi. no accessory muscle use, no dullness to percussion  CARD:  RRR, no m/r/g, no peripheral edema, pulses intact, no cyanosis or clubbing.  GI:   Soft & nt; nml bowel sounds; no organomegaly or masses detected.   Musco: Warm bil, no deformities or joint swelling noted.   Neuro: alert, no focal deficits noted.    Skin: Warm, no lesions or rashes  Lab Results:  CBC  BMET  Imaging: No results found.    PFT Results Latest Ref Rng & Units 10/25/2016  FVC-Pre L 3.28  FVC-Predicted Pre % 85  FVC-Post L 3.20  FVC-Predicted Post % 83  Pre FEV1/FVC % % 86  Post FEV1/FCV % % 88  FEV1-Pre L 2.83   FEV1-Predicted Pre % 90  FEV1-Post L 2.83  DLCO uncorrected ml/min/mmHg 23.44  DLCO UNC% % 70  DLCO corrected ml/min/mmHg 24.06  DLCO COR %Predicted % 72  DLVA Predicted % 96  TLC L 4.56  TLC % Predicted % 75  RV % Predicted % 65    No results found for: NITRICOXIDE      Assessment & Plan:   Asthmatic bronchitis Slow to resolve exacerbation post COVID-19 Reported x-ray yesterday with bronchitic changes no acute process We will have patient finish prednisone and antibiotic as recommended from yesterday. Continue on mucolytic's with Mucinex DM. Cough syrups as needed. Patient is advance activity as tolerated. She has previously been on [facility] however this has become discontinued. We will change her to Qvar to see if this holds her as she does not like dry powder inhalers or Symbicort.  Plan  Patient Instructions  Finish prednisone and Zpack  as directed.  Mucinex DM Twice daily  As needed  Cough .  Albuterol Inhaler 1-2 puffs every 4hrs as needed for wheezing  Begin QVAR 2 puffs Twice daily  , brush/rinse/gargle after use.  Follow up with Dr. Melvyn Novas  In 3 months and As needed   Please contact office for sooner follow up if symptoms do not improve or worsen or seek emergency care        COVID-19 virus infection COVID-19 infection-patient is making slow recovery. She has finished a course of antivirals. Recent chest x-ray without acute pneumonia reported. We will have her finish up antibiotic course and steroids for a slow to resolve asthmatic bronchitic exacerbation.  Plan  Patient Instructions  Finish prednisone and Zpack  as directed.  Mucinex DM Twice daily  As needed  Cough .  Albuterol Inhaler 1-2 puffs every 4hrs as needed for wheezing  Begin QVAR 2 puffs Twice daily  , brush/rinse/gargle after use.  Follow up with Dr. Melvyn Novas  In 3 months and As needed   Please contact office for sooner follow up if symptoms do not improve or worsen or seek emergency care            Rexene Edison, NP 01/23/2021

## 2021-01-23 NOTE — Assessment & Plan Note (Signed)
COVID-19 infection-patient is making slow recovery. She has finished a course of antivirals. Recent chest x-ray without acute pneumonia reported. We will have her finish up antibiotic course and steroids for a slow to resolve asthmatic bronchitic exacerbation.  Plan  Patient Instructions  Finish prednisone and Zpack  as directed.  Mucinex DM Twice daily  As needed  Cough .  Albuterol Inhaler 1-2 puffs every 4hrs as needed for wheezing  Begin QVAR 2 puffs Twice daily  , brush/rinse/gargle after use.  Follow up with Dr. Melvyn Novas  In 3 months and As needed   Please contact office for sooner follow up if symptoms do not improve or worsen or seek emergency care

## 2021-01-23 NOTE — Patient Instructions (Addendum)
Finish prednisone and Zpack  as directed.  Mucinex DM Twice daily  As needed  Cough .  Albuterol Inhaler 1-2 puffs every 4hrs as needed for wheezing  Begin QVAR 2 puffs Twice daily  , brush/rinse/gargle after use.  Follow up with Dr. Melvyn Novas  In 3 months and As needed   Please contact office for sooner follow up if symptoms do not improve or worsen or seek emergency care

## 2021-01-23 NOTE — Assessment & Plan Note (Signed)
Slow to resolve exacerbation post COVID-19 Reported x-ray yesterday with bronchitic changes no acute process We will have patient finish prednisone and antibiotic as recommended from yesterday. Continue on mucolytic's with Mucinex DM. Cough syrups as needed. Patient is advance activity as tolerated. She has previously been on [facility] however this has become discontinued. We will change her to Qvar to see if this holds her as she does not like dry powder inhalers or Symbicort.  Plan  Patient Instructions  Finish prednisone and Zpack  as directed.  Mucinex DM Twice daily  As needed  Cough .  Albuterol Inhaler 1-2 puffs every 4hrs as needed for wheezing  Begin QVAR 2 puffs Twice daily  , brush/rinse/gargle after use.  Follow up with Dr. Melvyn Novas  In 3 months and As needed   Please contact office for sooner follow up if symptoms do not improve or worsen or seek emergency care

## 2021-01-28 ENCOUNTER — Encounter: Payer: Self-pay | Admitting: Emergency Medicine

## 2021-01-28 ENCOUNTER — Emergency Department: Payer: Self-pay

## 2021-01-28 ENCOUNTER — Other Ambulatory Visit: Payer: Self-pay

## 2021-01-28 ENCOUNTER — Emergency Department
Admission: EM | Admit: 2021-01-28 | Discharge: 2021-01-28 | Disposition: A | Discharge status: Home / Self Care | Payer: Self-pay | Attending: Emergency Medicine | Admitting: Emergency Medicine

## 2021-01-28 DIAGNOSIS — R1010 Upper abdominal pain, unspecified: Secondary | ICD-10-CM | POA: Insufficient documentation

## 2021-01-28 DIAGNOSIS — Z8616 Personal history of COVID-19: Secondary | ICD-10-CM | POA: Insufficient documentation

## 2021-01-28 DIAGNOSIS — I129 Hypertensive chronic kidney disease with stage 1 through stage 4 chronic kidney disease, or unspecified chronic kidney disease: Secondary | ICD-10-CM | POA: Insufficient documentation

## 2021-01-28 DIAGNOSIS — Z7984 Long term (current) use of oral hypoglycemic drugs: Secondary | ICD-10-CM | POA: Insufficient documentation

## 2021-01-28 DIAGNOSIS — R1013 Epigastric pain: Secondary | ICD-10-CM | POA: Insufficient documentation

## 2021-01-28 DIAGNOSIS — J45909 Unspecified asthma, uncomplicated: Secondary | ICD-10-CM | POA: Insufficient documentation

## 2021-01-28 DIAGNOSIS — R112 Nausea with vomiting, unspecified: Secondary | ICD-10-CM | POA: Insufficient documentation

## 2021-01-28 DIAGNOSIS — Z79899 Other long term (current) drug therapy: Secondary | ICD-10-CM | POA: Insufficient documentation

## 2021-01-28 DIAGNOSIS — E1122 Type 2 diabetes mellitus with diabetic chronic kidney disease: Secondary | ICD-10-CM | POA: Insufficient documentation

## 2021-01-28 DIAGNOSIS — Z9884 Bariatric surgery status: Secondary | ICD-10-CM | POA: Insufficient documentation

## 2021-01-28 DIAGNOSIS — R519 Headache, unspecified: Secondary | ICD-10-CM | POA: Insufficient documentation

## 2021-01-28 DIAGNOSIS — Z87891 Personal history of nicotine dependence: Secondary | ICD-10-CM | POA: Insufficient documentation

## 2021-01-28 DIAGNOSIS — N189 Chronic kidney disease, unspecified: Secondary | ICD-10-CM | POA: Insufficient documentation

## 2021-01-28 LAB — CBC
HCT: 35.2 % — ABNORMAL LOW (ref 36.0–46.0)
Hemoglobin: 11.5 g/dL — ABNORMAL LOW (ref 12.0–15.0)
MCH: 28.7 pg (ref 26.0–34.0)
MCHC: 32.7 g/dL (ref 30.0–36.0)
MCV: 87.8 fL (ref 80.0–100.0)
Platelets: 170 10*3/uL (ref 150–400)
RBC: 4.01 MIL/uL (ref 3.87–5.11)
RDW: 13.1 % (ref 11.5–15.5)
WBC: 7.4 10*3/uL (ref 4.0–10.5)
nRBC: 0 % (ref 0.0–0.2)

## 2021-01-28 LAB — URINALYSIS, COMPLETE (UACMP) WITH MICROSCOPIC
Bilirubin Urine: NEGATIVE
Glucose, UA: NEGATIVE mg/dL
Hgb urine dipstick: NEGATIVE
Ketones, ur: NEGATIVE mg/dL
Nitrite: NEGATIVE
Protein, ur: 30 mg/dL — AB
Specific Gravity, Urine: 1.033 — ABNORMAL HIGH (ref 1.005–1.030)
pH: 5 (ref 5.0–8.0)

## 2021-01-28 LAB — TROPONIN I (HIGH SENSITIVITY)
Troponin I (High Sensitivity): <2 ng/L (ref <18)
Troponin I (High Sensitivity): <2 ng/L (ref <18)

## 2021-01-28 LAB — COMPREHENSIVE METABOLIC PANEL
ALT: 14 U/L (ref 0–44)
AST: 16 U/L (ref 15–41)
Albumin: 3.8 g/dL (ref 3.5–5.0)
Alkaline Phosphatase: 53 U/L (ref 38–126)
Anion gap: 11 (ref 5–15)
BUN: 14 mg/dL (ref 6–20)
CO2: 23 mmol/L (ref 22–32)
Calcium: 8.8 mg/dL — ABNORMAL LOW (ref 8.9–10.3)
Chloride: 104 mmol/L (ref 98–111)
Creatinine, Ser: 1 mg/dL (ref 0.44–1.00)
GFR, Estimated: >60 mL/min (ref >60)
Glucose, Bld: 138 mg/dL — ABNORMAL HIGH (ref 70–99)
Potassium: 3.8 mmol/L (ref 3.5–5.1)
Sodium: 138 mmol/L (ref 135–145)
Total Bilirubin: 0.7 mg/dL (ref 0.3–1.2)
Total Protein: 7.1 g/dL (ref 6.5–8.1)

## 2021-01-28 LAB — LIPASE, BLOOD: Lipase: 29 U/L (ref 11–51)

## 2021-01-28 MED ORDER — LIDOCAINE VISCOUS HCL 2 % MT SOLN
15.0000 mL | Freq: Once | OROMUCOSAL | Status: AC
  Administered 2021-01-28: 15 mL via ORAL
  Filled 2021-01-28: qty 15

## 2021-01-28 MED ORDER — SODIUM CHLORIDE 0.9 % IV BOLUS
1000.0000 mL | Freq: Once | INTRAVENOUS | Status: AC
  Administered 2021-01-28: 1000 mL via INTRAVENOUS

## 2021-01-28 MED ORDER — ACETAMINOPHEN 500 MG PO TABS
1000.0000 mg | ORAL_TABLET | Freq: Once | ORAL | Status: AC
  Administered 2021-01-28: 1000 mg via ORAL
  Filled 2021-01-28: qty 2

## 2021-01-28 MED ORDER — ALUM & MAG HYDROXIDE-SIMETH 200-200-20 MG/5ML PO SUSP
30.0000 mL | Freq: Once | ORAL | Status: AC
  Administered 2021-01-28: 30 mL via ORAL
  Filled 2021-01-28: qty 30

## 2021-01-28 MED ORDER — DIPHENHYDRAMINE HCL 50 MG/ML IJ SOLN
50.0000 mg | Freq: Once | INTRAMUSCULAR | Status: AC
  Administered 2021-01-28: 50 mg via INTRAVENOUS
  Filled 2021-01-28: qty 1

## 2021-01-28 MED ORDER — METOCLOPRAMIDE HCL 5 MG/ML IJ SOLN
10.0000 mg | Freq: Once | INTRAMUSCULAR | Status: AC
  Administered 2021-01-28: 10 mg via INTRAVENOUS
  Filled 2021-01-28: qty 2

## 2021-01-28 MED ORDER — FOSFOMYCIN TROMETHAMINE 3 G PO PACK
3.0000 g | PACK | Freq: Once | ORAL | Status: AC
  Administered 2021-01-28: 3 g via ORAL
  Filled 2021-01-28: qty 3

## 2021-01-28 NOTE — ED Triage Notes (Signed)
Pt to ED from home c/o upper abd pain for several weeks that feels like pressure but worse today.  States nausea and vomiting x1 today, denies urinary changes.  Had gastric bypass in April.  Having regular BMs.  States hx of H. Pylori and feels similar to that.  Pt ambulatory with steady gait, chest rise even and unlabored, in NAD at this time.

## 2021-01-28 NOTE — ED Provider Notes (Signed)
[facility] Emergency Department Provider Note  Time seen: 8:45 PM  I have reviewed the triage vital signs and the nursing notes.   HISTORY  Chief Complaint Abdominal Pain   HPI Victoria Moore is a 45 y.o. female with a past medical history of anxiety, anemia, CKD, diabetes, hypertension, presents to the emergency department for upper abdominal discomfort and a migraine.   According to the patient over the past few days she has been experiencing upper abdominal pain, states she gets this pain fairly frequently and she thinks it is due to "H. pylori."  Denies any diarrhea but does state occasional nausea and vomiting.  Denies any dysuria, hematuria, vaginal bleeding or discharge.  No fever cough or shortness of breath.  Patient had Covid 1 month ago.  Past Medical History:  Diagnosis Date  . Acute low back pain without sciatica 06/22/2018  . ADHD   . Allergy   . Anemia   . Anxiety   . Arthritis   . 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 Fibromyalgia, doubt sarcoid   . Asthma   . Blood transfusion without reported diagnosis   . Chest wall pain 05/26/2018  . Chronic kidney disease   . Chronic low back pain with sciatica 09/15/2018  . Common migraine with intractable migraine 10/07/2016  . Cough variant asthma vs UACS  10/23/2016   - Spirometry 04/15/2016  Very truncated exp loop effort dep portion only  - Allergy profile 10/22/2016 >  Eos 0.2/  IgE  52 RAST POS grass/trees/ragweed  10/22/2016  After extensive coaching HFA effectiveness =    75% try duelra 100 2bid > improved  . Daytime sleepiness 05/26/2018  . Decreased pedal pulses 10/04/2018  . Depression   . Depression, major, single episode, moderate ([place]) 05/26/2018  . Diabetes ([place])   . Dyspnea 04/15/2016   04/15/2016  Walked RA x 3 laps @ 185 ft each stopped due to  End of study, nl pace, no sob or desat    - Spirometry 04/15/2016  Very truncated exp loop effort  dep portion only  - 10/22/2016  Walked RA x 3 laps @ 185 ft each stopped due to  End of study, slow pace, min sob/ no desat - full pfts rec 10/22/2016 >>>      . Edema   . Essential hypertension, benign 11/15/2016  . Fibroids 03/11/2016  . Frequency of urination 09/11/2018  . GERD (gastroesophageal reflux disease)   . Herniated lumbar intervertebral disc   . Hiatal hernia   . Hilar adenopathy   . Hypertension   . Hypokalemia 06/28/2018  . Impaired fasting blood sugar 09/11/2018  . Iron deficiency   . Leg pain 12/07/2018  . Low libido 11/27/2018  . Migraine   . Mild sleep apnea 08/03/2018   HST 06/20/18  AHI  8.1 / snoring with 02 nadir 80% >  08/03/2018 rec sleep medicine consultation   . Morbid (severe) obesity due to excess calories ([place]) 10/23/2016  . Personal history of sarcoidosis 05/26/2018  . Prolonged capillary refill time   . Right leg swelling 08/18/2018  . Sarcoidosis    personal history of  . Seizures ([place])    history of   . Sleep apnea   . Spondylosis   . Vitamin D deficiency     Patient Active Problem List   Diagnosis Date Noted  . Asthmatic bronchitis 01/23/2021  . COVID-19 virus infection 01/23/2021  .  Social anxiety disorder 10/03/2020  . Attention deficit hyperactivity disorder (ADHD), 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  . Mild episode of recurrent major depressive disorder ([place]) 10/03/2020  . Constipation 05/20/2020  . S/P gastric bypass 05/12/2020  . Intractable abdominal pain 04/27/2020  . Benign intracranial hypertension 02/14/2020  . Myopia of both eyes 02/14/2020  . 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 BMI 45.0-49.9, adult ([place]) 11/29/2019  . Other intervertebral disc degeneration, lumbar region 09/19/2019  . Complex cyst of left ovary 02/01/2019  . Sarcoidosis 02/01/2019  . Leg pain 12/07/2018  . Low libido 11/27/2018  . Prolonged capillary refill time 10/04/2018  . Decreased pedal pulses 10/04/2018  . Spondylosis 09/15/2018  . Chronic low  back pain with sciatica 09/15/2018  . Impaired fasting blood sugar 09/11/2018  . Weight gain 09/11/2018  . Mild sleep apnea 08/03/2018  . Hypokalemia 06/28/2018  . Medication side effect 06/22/2018  . Acute low back pain without sciatica 06/22/2018  . Personal history of sarcoidosis 05/26/2018  . Vitamin D deficiency 05/26/2018  . Encounter for health maintenance examination with abnormal findings 05/26/2018  . Depression, major, single episode, moderate ([place]) 05/26/2018  . Daytime sleepiness 05/26/2018  . Chest pain of uncertain etiology 63/14/9702  . GERD (gastroesophageal reflux disease) 05/26/2018  . Essential hypertension, benign 11/15/2016  . 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 Morbid (severe) obesity due to excess calories ([place]) 10/23/2016  . Common migraine with intractable migraine 10/07/2016  . DOE (dyspnea on exertion) 04/15/2016  . Hilar adenopathy 04/15/2016  . Seizures ([place]) 03/11/2016  . Anemia 11/12/2014  . Iron deficiency 11/12/2014    Past Surgical History:  Procedure Laterality Date  . ABDOMINAL HYSTERECTOMY    . ESOPHAGEAL MANOMETRY N/A 09/05/2019   Procedure: ESOPHAGEAL MANOMETRY (EM);  Surgeon: Lavena Bullion, DO;  Location: [facility];  Service: Gastroenterology;  Laterality: N/A;  . LAPAROSCOPIC OVARIAN CYSTECTOMY Left 03/11/2016   Procedure: LAPAROSCOPIC OVARIAN CYSTECTOMY;  Surgeon: Eldred Manges, MD;  Location: [facility];  Service: Gynecology;  Laterality: Left;  . LAPAROSCOPIC VAGINAL HYSTERECTOMY WITH SALPINGECTOMY Bilateral 03/11/2016   Procedure: LAPAROSCOPIC ASSISTED VAGINAL HYSTERECTOMY WITH SALPINGECTOMY;  Surgeon: Eldred Manges, MD;  Location: [facility];  Service: Gynecology;  Laterality: Bilateral;  . [place] IMPEDANCE STUDY  09/05/2019   Procedure: [place] IMPEDANCE STUDY;  Surgeon: Lavena Bullion, DO;  Location: [facility];  Service: Gastroenterology;;  . TUBAL LIGATION    . UPPER GASTROINTESTINAL ENDOSCOPY  10/11/2019   06/2019    Prior  to Admission medications   Medication Sig Start Date End Date Taking? Authorizing Provider  ACETAMINOPHEN EXTRA STRENGTH 500 MG tablet Take 1,000 mg by mouth every 6 (six) hours as needed for pain. 04/08/20   [provider]  albuterol (PROVENTIL) (2.5 MG/3ML) 0.083% nebulizer solution Take 2.5 mg by nebulization every 6 (six) hours as needed for wheezing or shortness of breath.    [provider]  albuterol (VENTOLIN HFA) 108 (90 Base) MCG/ACT inhaler Inhale 1-2 puffs into the lungs every 4 (four) hours as needed for wheezing or shortness of breath. 01/23/21   Parrett, Fonnie Mu, NP  ALPRAZolam (XANAX) 0.25 MG tablet Take 1 tablet (0.25 mg total) by mouth 2 (two) times daily as needed for anxiety. 10/03/20   Salley Slaughter, NP  amphetamine-dextroamphetamine (ADDERALL XR) 20 MG 24 hr capsule Take 1 capsule (20 mg total) by mouth as needed. 12/17/20   Nwoko, Terese Door, PA  beclomethasone (QVAR) 80 MCG/ACT inhaler Inhale 2 puffs into the  lungs 2 (two) times daily. 01/23/21   Parrett, Virgel Bouquet, NP  dextromethorphan-guaiFENesin (MUCINEX DM) 30-600 MG 12hr tablet Take 1 tablet by mouth 2 (two) times daily. 01/01/21   Rhys Martini, PA-C  ferrous sulfate (FER-IN-SOL) 75 (15 Fe) MG/ML SOLN Take 0.8 mLs (60 mg total) by mouth 2 (two) times daily. With food 07/22/20 08/21/20  Esterwood, Amy S, PA-C  GAVILAX 17 GM/SCOOP powder Take 17 g by mouth daily. 05/20/20   [provider]  guaiFENesin-codeine (ROBITUSSIN AC) 100-10 MG/5ML syrup Take by mouth. 01/21/21   [provider]  HYDROcodone-acetaminophen (NORCO/VICODIN) 5-325 MG tablet Take 1 tablet by mouth every 4 (four) hours as needed for moderate pain. Patient not taking: Reported on 01/23/2021 08/16/20   Jene Every, MD  metFORMIN (GLUCOPHAGE) 500 MG tablet Take 500 mg by mouth 2 (two) times daily with a meal.    [provider]  ondansetron (ZOFRAN) 4 MG tablet Take 4 mg by mouth every 8 (eight) hours as needed.  05/13/20   [provider]  pantoprazole (PROTONIX) 40 MG tablet Take 1 tablet (40 mg total) by mouth 2 (two) times daily for 14 days. 09/15/20 09/29/20  Jene Every, MD  potassium chloride SA (KLOR-CON) 20 MEQ tablet Take 1 tablet (20 mEq total) by mouth 2 (two) times daily. 07/23/20   Esterwood, Amy S, PA-C  promethazine (PROMETHEGAN) 12.5 MG suppository Place 1 suppository (12.5 mg total) rectally every 6 (six) hours as needed for nausea or vomiting. 08/16/20   Jene Every, MD  promethazine-dextromethorphan (PROMETHAZINE-DM) 6.25-15 MG/5ML syrup Take 5 mLs by mouth 4 (four) times daily as needed for cough. 01/01/21   Rhys Martini, PA-C  SUMAtriptan (IMITREX) 100 MG tablet TAKE 1 TABLET BY MOUTH 2X DAILY AS NEEDED FOR MIGRAINE. MAY REPEAT IN 2 HOURS IF HEADACHE PERSISTS OR RECURS. 12/11/19   York Spaniel, MD  traMADol (ULTRAM) 50 MG tablet Take 50 mg by mouth every 6 (six) hours as needed for moderate pain.  03/27/20   [provider]  traZODone (DESYREL) 50 MG tablet Take 1 tablet (50 mg total) by mouth at bedtime as needed for sleep. 10/03/20   Shanna Cisco, NP  chlorthalidone (HYGROTON) 25 MG tablet TAKE 1 TABLET BY MOUTH EVERY DAY Patient taking differently: Take 25 mg by mouth daily.  11/05/19 01/01/21  Ngetich, Donalee Citrin, NP    Allergies  Allergen Reactions  . Aspirin Anaphylaxis and Hives         Family History  Adopted: Yes  Problem Relation Age of Onset  . Other Son        Growing pains  . Asthma Mother   . Heart Problems Mother   . Migraines Mother   . COPD Mother   . Heart attack Mother   . Diabetes Father   . Peptic Ulcer Father   . Heart Problems Father   . COPD Father   . Heart attack Father   . Colon cancer Neg Hx   . Colon polyps Neg Hx   . Esophageal cancer Neg Hx   . Rectal cancer Neg Hx   . Stomach cancer Neg Hx     Social History Social History   Tobacco Use  . Smoking status: Former Smoker    Packs/day: 0.25    Years: 17.00     Pack years: 4.25    Types: Cigarettes    Quit date: 03/02/2013    Years since quitting: 7.9  . Smokeless tobacco: Never Used  Vaping Use  . Vaping Use: Never used  Substance Use Topics  . Alcohol use: No    Alcohol/week: 0.0 standard drinks  . Drug use: Not Currently    Types: Marijuana    Comment: former - 6 yrs ago    Review of Systems Constitutional: Negative for fever. Cardiovascular: Negative for chest pain. Respiratory: Negative for shortness of breath. Gastrointestinal: Mild upper abdominal discomfort.  Positive intermittent nausea vomiting.  Negative for diarrhea. Genitourinary: Negative for urinary compaints.  Negative for vaginal symptoms. Musculoskeletal: Negative for musculoskeletal complaints Neurological: Moderate dull headache consistent with prior migraines per patient. All other ROS negative  ____________________________________________   PHYSICAL EXAM:  VITAL SIGNS: ED Triage Vitals  Enc Vitals Group     BP 01/28/21 1912 129/87     Pulse Rate 01/28/21 1912 60     Resp 01/28/21 1912 16     Temp 01/28/21 1912 98.2 F (36.8 C)     Temp Source 01/28/21 1912 Oral     SpO2 01/28/21 1912 99 %     Weight 01/28/21 1913 209 lb (94.8 kg)     Height 01/28/21 1913 6' (1.829 m)     Head Circumference --      Peak Flow --      Pain Score 01/28/21 1913 8     Pain Loc --      Pain Edu? --      Excl. in [place]? --    Constitutional: Alert and oriented. Well appearing and in no distress. Eyes: Normal exam ENT      Head: Normocephalic and atraumatic.      Mouth/Throat: Mucous membranes are moist. Cardiovascular: Normal rate, regular rhythm.  Respiratory: Normal respiratory effort without tachypnea nor retractions. Breath sounds are clear Gastrointestinal: Soft, mild epigastric tenderness palpation otherwise benign abdomen without rebound guarding or distention.  No lower abdominal tenderness. Musculoskeletal: Nontender with normal range of motion in all  extremities.  Neurologic:  Normal speech and language. No gross focal neurologic deficits  Skin:  Skin is warm, dry and intact.  Psychiatric: Mood and affect are normal.   ____________________________________________    EKG  EKG viewed and interpreted by myself shows sinus bradycardia 59 bpm with a narrow QRS, normal axis, normal intervals, no concerning ST changes.  ____________________________________________    RADIOLOGY  Chest x-ray is negative  ____________________________________________   INITIAL IMPRESSION / ASSESSMENT AND PLAN / ED COURSE  Pertinent labs & imaging results that were available during my care of the patient were reviewed by me and considered in my medical decision making (see chart for details).   Patient presents emergency department for upper abdominal discomfort intermittent nausea vomiting and a migraine headache.  Overall the patient appears well, reassuring physical exam besides slight epigastric tenderness palpation.  Given the patient's discomfort we will check labs including LFTs and pancreas as well as a troponin.  Chest x-ray is negative, EKG is reassuring.  We will dose a GI cocktail, fluids Tylenol Reglan and Benadryl and reassess.  Patient's urinalysis shows possible urinary tract infection we will send a urine culture.  Patient denies any urinary symptoms.  Patient's work-up is overall reassuring.  Chemistry is reassuring.  White blood cell count normal, troponin negative.  Patient is feeling better after medications.  Given the patient's reassuring work-up and physical exam I believe the patient is safe for discharge home with PCP follow-up.  Given the patient's urine sample I have sent a urine culture as a precaution but we will  treat with a one-time dose of fosfomycin.  Patient agreeable to plan of care.  Victoria Moore was evaluated in Emergency Department on 01/28/2021 for the symptoms described in the history of present illness. She was  evaluated in the context of the global COVID-19 pandemic, which necessitated consideration that the patient might be at risk for infection with the SARS-CoV-2 virus that causes COVID-19. Institutional protocols and algorithms that pertain to the evaluation of patients at risk for COVID-19 are in a state of rapid change based on information released by regulatory bodies including the CDC and federal and state organizations. These policies and algorithms were followed during the patient's care in the ED.  ____________________________________________   FINAL CLINICAL IMPRESSION(S) / ED DIAGNOSES  Abdominal pain Headache   Harvest Dark, MD 01/28/21 2224

## 2021-01-29 ENCOUNTER — Ambulatory Visit: Payer: Self-pay | Admitting: Physician Assistant

## 2021-01-31 LAB — URINE CULTURE: Culture: >=100000 — AB

## 2021-02-01 NOTE — Progress Notes (Signed)
ED Antimicrobial Stewardship Positive Culture Follow Up   Victoria Moore is an 45 y.o. female who presented to [facility] on 01/28/2021 with a chief complaint of  Chief Complaint  Patient presents with  . Abdominal Pain    Recent Results (from the past 720 hour(s))  Urine Culture     Status: Abnormal   Collection Time: 01/28/21  7:18 PM   Specimen: Urine, Random  Result Value Ref Range Status   Specimen Description   Final    URINE, RANDOM Performed at [facility], [address], [place], [place] [address]    Special Requests   Final    NONE Performed at [facility], [place], [place] [address]    Culture >=100,000 COLONIES/mL KLEBSIELLA PNEUMONIAE (A)  Final   Report Status 01/31/2021 FINAL  Final   Organism ID, Bacteria KLEBSIELLA PNEUMONIAE (A)  Final      Susceptibility   Klebsiella pneumoniae - MIC*    AMPICILLIN >=32 RESISTANT Resistant     CEFAZOLIN <=4 SENSITIVE Sensitive     CEFEPIME <=0.12 SENSITIVE Sensitive     CEFTRIAXONE <=0.25 SENSITIVE Sensitive     CIPROFLOXACIN <=0.25 SENSITIVE Sensitive     GENTAMICIN <=1 SENSITIVE Sensitive     IMIPENEM <=0.25 SENSITIVE Sensitive     NITROFURANTOIN 32 SENSITIVE Sensitive     TRIMETH/SULFA <=20 SENSITIVE Sensitive     AMPICILLIN/SULBACTAM 8 SENSITIVE Sensitive     PIP/TAZO <=4 SENSITIVE Sensitive     * >=100,000 COLONIES/mL KLEBSIELLA PNEUMONIAE   45 yo F presenting with upper abdominal pain and migraine. Pt denied urinary symptom including dysuria, hematuria, vaginal bleeding, or discharge. Recommended not treating since pt asymptomatic, EDP agreed.  New antibiotic prescription: None  ED Provider: Jana Half, PharmD Pharmacy Resident  02/01/2021 1:51 PM

## 2021-02-09 ENCOUNTER — Emergency Department (HOSPITAL_COMMUNITY)
Admission: EM | Admit: 2021-02-09 | Discharge: 2021-02-10 | Disposition: A | Discharge status: Home / Self Care | Payer: Self-pay | Attending: Emergency Medicine | Admitting: Emergency Medicine

## 2021-02-09 ENCOUNTER — Other Ambulatory Visit: Payer: Self-pay

## 2021-02-09 ENCOUNTER — Encounter (HOSPITAL_COMMUNITY): Payer: Self-pay | Admitting: Emergency Medicine

## 2021-02-09 DIAGNOSIS — Z87891 Personal history of nicotine dependence: Secondary | ICD-10-CM | POA: Insufficient documentation

## 2021-02-09 DIAGNOSIS — K279 Peptic ulcer, site unspecified, unspecified as acute or chronic, without hemorrhage or perforation: Secondary | ICD-10-CM | POA: Insufficient documentation

## 2021-02-09 DIAGNOSIS — E119 Type 2 diabetes mellitus without complications: Secondary | ICD-10-CM | POA: Insufficient documentation

## 2021-02-09 DIAGNOSIS — Z8616 Personal history of COVID-19: Secondary | ICD-10-CM | POA: Insufficient documentation

## 2021-02-09 DIAGNOSIS — Z79899 Other long term (current) drug therapy: Secondary | ICD-10-CM | POA: Insufficient documentation

## 2021-02-09 DIAGNOSIS — I10 Essential (primary) hypertension: Secondary | ICD-10-CM | POA: Insufficient documentation

## 2021-02-09 DIAGNOSIS — J45909 Unspecified asthma, uncomplicated: Secondary | ICD-10-CM | POA: Insufficient documentation

## 2021-02-09 DIAGNOSIS — K219 Gastro-esophageal reflux disease without esophagitis: Secondary | ICD-10-CM | POA: Insufficient documentation

## 2021-02-09 DIAGNOSIS — R1013 Epigastric pain: Secondary | ICD-10-CM

## 2021-02-09 DIAGNOSIS — Z7984 Long term (current) use of oral hypoglycemic drugs: Secondary | ICD-10-CM | POA: Insufficient documentation

## 2021-02-09 LAB — URINALYSIS, ROUTINE W REFLEX MICROSCOPIC
Bilirubin Urine: NEGATIVE
Glucose, UA: NEGATIVE mg/dL
Hgb urine dipstick: NEGATIVE
Ketones, ur: NEGATIVE mg/dL
Leukocytes,Ua: NEGATIVE
Nitrite: NEGATIVE
Protein, ur: NEGATIVE mg/dL
Specific Gravity, Urine: 1.023 (ref 1.005–1.030)
pH: 5 (ref 5.0–8.0)

## 2021-02-09 LAB — CBC
HCT: 39.6 % (ref 36.0–46.0)
Hemoglobin: 11.6 g/dL — ABNORMAL LOW (ref 12.0–15.0)
MCH: 28.2 pg (ref 26.0–34.0)
MCHC: 29.3 g/dL — ABNORMAL LOW (ref 30.0–36.0)
MCV: 96.4 fL (ref 80.0–100.0)
Platelets: 169 10*3/uL (ref 150–400)
RBC: 4.11 MIL/uL (ref 3.87–5.11)
RDW: 13.6 % (ref 11.5–15.5)
WBC: 3.8 10*3/uL — ABNORMAL LOW (ref 4.0–10.5)
nRBC: 0 % (ref 0.0–0.2)

## 2021-02-09 LAB — COMPREHENSIVE METABOLIC PANEL
ALT: 12 U/L (ref 0–44)
AST: 15 U/L (ref 15–41)
Albumin: 3.3 g/dL — ABNORMAL LOW (ref 3.5–5.0)
Alkaline Phosphatase: 52 U/L (ref 38–126)
Anion gap: 11 (ref 5–15)
BUN: 7 mg/dL (ref 6–20)
CO2: 21 mmol/L — ABNORMAL LOW (ref 22–32)
Calcium: 8.6 mg/dL — ABNORMAL LOW (ref 8.9–10.3)
Chloride: 108 mmol/L (ref 98–111)
Creatinine, Ser: 0.84 mg/dL (ref 0.44–1.00)
GFR, Estimated: >60 mL/min (ref >60)
Glucose, Bld: 84 mg/dL (ref 70–99)
Potassium: 3.6 mmol/L (ref 3.5–5.1)
Sodium: 140 mmol/L (ref 135–145)
Total Bilirubin: 0.3 mg/dL (ref 0.3–1.2)
Total Protein: 6 g/dL — ABNORMAL LOW (ref 6.5–8.1)

## 2021-02-09 LAB — LIPASE, BLOOD: Lipase: 28 U/L (ref 11–51)

## 2021-02-09 NOTE — ED Triage Notes (Signed)
Patient from home. Complaint of abdominal pain that is worse today. Patient states her pcp sent her here worried about infection. VSS. NAD.

## 2021-02-09 NOTE — ED Notes (Signed)
Pt states she is going to the cafeteria for food, will be back

## 2021-02-10 ENCOUNTER — Emergency Department (HOSPITAL_COMMUNITY): Payer: Self-pay

## 2021-02-10 MED ORDER — OXYCODONE-ACETAMINOPHEN 5-325 MG PO TABS: 2.0000 | ORAL_TABLET | Freq: Two times a day (BID) | ORAL | 0 refills | Status: DC | PRN

## 2021-02-10 MED ORDER — SUCRALFATE 1 G PO TABS
1.0000 g | ORAL_TABLET | Freq: Once | ORAL | Status: AC
  Administered 2021-02-10: 1 g via ORAL
  Filled 2021-02-10: qty 1

## 2021-02-10 MED ORDER — HYDROMORPHONE HCL 1 MG/ML IJ SOLN
1.0000 mg | Freq: Once | INTRAMUSCULAR | Status: AC
  Administered 2021-02-10: 1 mg via INTRAMUSCULAR
  Filled 2021-02-10: qty 1

## 2021-02-10 MED ORDER — SUCRALFATE 1 G PO TABS: 1.0000 g | ORAL_TABLET | Freq: Three times a day (TID) | ORAL | 0 refills | Status: DC

## 2021-02-10 MED ORDER — FAMOTIDINE 20 MG PO TABS: 20.0000 mg | ORAL_TABLET | Freq: Two times a day (BID) | ORAL | 0 refills | Status: DC

## 2021-02-10 MED ORDER — ALUM & MAG HYDROXIDE-SIMETH 200-200-20 MG/5ML PO SUSP
30.0000 mL | Freq: Once | ORAL | Status: AC
  Administered 2021-02-10: 30 mL via ORAL
  Filled 2021-02-10: qty 30

## 2021-02-10 MED ORDER — PANTOPRAZOLE SODIUM 40 MG PO TBEC
80.0000 mg | DELAYED_RELEASE_TABLET | Freq: Once | ORAL | Status: AC
  Administered 2021-02-10: 80 mg via ORAL
  Filled 2021-02-10: qty 2

## 2021-02-10 MED ORDER — OXYCODONE-ACETAMINOPHEN 5-325 MG PO TABS
1.0000 | ORAL_TABLET | Freq: Once | ORAL | Status: AC
  Administered 2021-02-10: 1 via ORAL
  Filled 2021-02-10: qty 1

## 2021-02-10 NOTE — Discharge Instructions (Addendum)
Please take famotidine in addition to the medications you're taking. Call: [phone] to get the earliest appointment available with a provider with GI service.   Take Percocet for severe pain only. Bland diet is recommended.

## 2021-02-10 NOTE — ED Notes (Signed)
Patient transported to US 

## 2021-02-10 NOTE — ED Provider Notes (Signed)
[facility] EMERGENCY DEPARTMENT Provider Note   CSN: [id number] Arrival date & time: 02/09/21  1849     History Chief Complaint  Patient presents with  . Abdominal Pain    Victoria Moore is a 45 y.o. female.  HPI     45 year old female comes in a chief complaint of abdominal pain.  She has history of peptic ulcer disease and is status post Roux-en-Y gastric bypass.  She also had H. pylori infection last year.  She reports that she was doing well until 2 weeks ago when she started having epigastric abdominal pain again.  The pain is similar to her gastritis pain.  Pain has been constant, no specific evoking, aggravating or relieving factors.  Specifically, she does not think the pain is worse with p.o. intake.  Currently she is not having any vomiting, diarrhea.  Pain is limited to the upper quadrants only.  Review of system is negative for any cough, chest pain, body aches, fevers or chills.  Past Medical History:  Diagnosis Date  . Acute low back pain without sciatica 06/22/2018  . ADHD   . Allergy   . Anemia   . Anxiety   . Arthritis   . 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 Fibromyalgia, doubt sarcoid   . Asthma   . Blood transfusion without reported diagnosis   . Chest wall pain 05/26/2018  . Chronic kidney disease   . Chronic low back pain with sciatica 09/15/2018  . Common migraine with intractable migraine 10/07/2016  . Cough variant asthma vs UACS  10/23/2016   - Spirometry 04/15/2016  Very truncated exp loop effort dep portion only  - Allergy profile 10/22/2016 >  Eos 0.2/  IgE  52 RAST POS grass/trees/ragweed  10/22/2016  After extensive coaching HFA effectiveness =    75% try duelra 100 2bid > improved  . Daytime sleepiness 05/26/2018  . Decreased pedal pulses 10/04/2018  . Depression   . Depression, major, single episode, moderate ([place]) 05/26/2018  . Diabetes ([place])   . Dyspnea 04/15/2016    04/15/2016  Walked RA x 3 laps @ 185 ft each stopped due to  End of study, nl pace, no sob or desat    - Spirometry 04/15/2016  Very truncated exp loop effort dep portion only  - 10/22/2016  Walked RA x 3 laps @ 185 ft each stopped due to  End of study, slow pace, min sob/ no desat - full pfts rec 10/22/2016 >>>      . Edema   . Essential hypertension, benign 11/15/2016  . Fibroids 03/11/2016  . Frequency of urination 09/11/2018  . GERD (gastroesophageal reflux disease)   . Herniated lumbar intervertebral disc   . Hiatal hernia   . Hilar adenopathy   . Hypertension   . Hypokalemia 06/28/2018  . Impaired fasting blood sugar 09/11/2018  . Iron deficiency   . Leg pain 12/07/2018  . Low libido 11/27/2018  . Migraine   . Mild sleep apnea 08/03/2018   HST 06/20/18  AHI  8.1 / snoring with 02 nadir 80% >  08/03/2018 rec sleep medicine consultation   . Morbid (severe) obesity due to excess calories ([place]) 10/23/2016  . Personal history of sarcoidosis 05/26/2018  . Prolonged capillary refill time   . Right leg swelling 08/18/2018  . Sarcoidosis    personal history of  . Seizures ([place])    history of   .  Sleep apnea   . Spondylosis   . Vitamin D deficiency     Patient Active Problem List   Diagnosis Date Noted  . Asthmatic bronchitis 01/23/2021  . COVID-19 virus infection 01/23/2021  . Social anxiety disorder 10/03/2020  . Attention deficit hyperactivity disorder (ADHD), 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  . Mild episode of recurrent major depressive disorder ([place]) 10/03/2020  . Constipation 05/20/2020  . S/P gastric bypass 05/12/2020  . Intractable abdominal pain 04/27/2020  . Benign intracranial hypertension 02/14/2020  . Myopia of both eyes 02/14/2020  . 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 BMI 45.0-49.9, adult ([place]) 11/29/2019  . Other intervertebral disc degeneration, lumbar region 09/19/2019  . Complex cyst of left ovary 02/01/2019  . Sarcoidosis 02/01/2019  . Leg pain 12/07/2018   . Low libido 11/27/2018  . Prolonged capillary refill time 10/04/2018  . Decreased pedal pulses 10/04/2018  . Spondylosis 09/15/2018  . Chronic low back pain with sciatica 09/15/2018  . Impaired fasting blood sugar 09/11/2018  . Weight gain 09/11/2018  . Mild sleep apnea 08/03/2018  . Hypokalemia 06/28/2018  . Medication side effect 06/22/2018  . Acute low back pain without sciatica 06/22/2018  . Personal history of sarcoidosis 05/26/2018  . Vitamin D deficiency 05/26/2018  . Encounter for health maintenance examination with abnormal findings 05/26/2018  . Depression, major, single episode, moderate ([place]) 05/26/2018  . Daytime sleepiness 05/26/2018  . Chest pain of uncertain etiology 86/76/7209  . GERD (gastroesophageal reflux disease) 05/26/2018  . Essential hypertension, benign 11/15/2016  . 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 Morbid (severe) obesity due to excess calories ([place]) 10/23/2016  . Common migraine with intractable migraine 10/07/2016  . DOE (dyspnea on exertion) 04/15/2016  . Hilar adenopathy 04/15/2016  . Seizures ([place]) 03/11/2016  . Anemia 11/12/2014  . Iron deficiency 11/12/2014    Past Surgical History:  Procedure Laterality Date  . ABDOMINAL HYSTERECTOMY    . ESOPHAGEAL MANOMETRY N/A 09/05/2019   Procedure: ESOPHAGEAL MANOMETRY (EM);  Surgeon: Lavena Bullion, DO;  Location: [facility];  Service: Gastroenterology;  Laterality: N/A;  . LAPAROSCOPIC OVARIAN CYSTECTOMY Left 03/11/2016   Procedure: LAPAROSCOPIC OVARIAN CYSTECTOMY;  Surgeon: Eldred Manges, MD;  Location: [facility];  Service: Gynecology;  Laterality: Left;  . LAPAROSCOPIC VAGINAL HYSTERECTOMY WITH SALPINGECTOMY Bilateral 03/11/2016   Procedure: LAPAROSCOPIC ASSISTED VAGINAL HYSTERECTOMY WITH SALPINGECTOMY;  Surgeon: Eldred Manges, MD;  Location: [facility];  Service: Gynecology;  Laterality: Bilateral;  . [place] IMPEDANCE STUDY  09/05/2019   Procedure: [place] IMPEDANCE STUDY;  Surgeon: Lavena Bullion, DO;  Location: [facility];  Service: Gastroenterology;;  . TUBAL LIGATION    . UPPER GASTROINTESTINAL ENDOSCOPY  10/11/2019   06/2019     OB History    Gravida  1   Para      Term      Preterm      AB      Living        SAB      IAB      Ectopic      Multiple      Live Births              Family History  Adopted: Yes  Problem Relation Age of Onset  . Other Son        Growing pains  . Asthma Mother   . Heart Problems Mother   . Migraines Mother   . COPD Mother   . Heart attack Mother   .  Diabetes Father   . Peptic Ulcer Father   . Heart Problems Father   . COPD Father   . Heart attack Father   . Colon cancer Neg Hx   . Colon polyps Neg Hx   . Esophageal cancer Neg Hx   . Rectal cancer Neg Hx   . Stomach cancer Neg Hx     Social History   Tobacco Use  . Smoking status: Former Smoker    Packs/day: 0.25    Years: 17.00    Pack years: 4.25    Types: Cigarettes    Quit date: 03/02/2013    Years since quitting: 7.9  . Smokeless tobacco: Never Used  Vaping Use  . Vaping Use: Never used  Substance Use Topics  . Alcohol use: No    Alcohol/week: 0.0 standard drinks  . Drug use: Not Currently    Types: Marijuana    Comment: former - 6 yrs ago    Home Medications Prior to Admission medications   Medication Sig Start Date End Date Taking? Authorizing Provider  ACETAMINOPHEN EXTRA STRENGTH 500 MG tablet Take 1,000 mg by mouth every 6 (six) hours as needed for pain. 04/08/20   [provider]  albuterol (PROVENTIL) (2.5 MG/3ML) 0.083% nebulizer solution Take 2.5 mg by nebulization every 6 (six) hours as needed for wheezing or shortness of breath.    [provider]  albuterol (VENTOLIN HFA) 108 (90 Base) MCG/ACT inhaler Inhale 1-2 puffs into the lungs every 4 (four) hours as needed for wheezing or shortness of breath. 01/23/21   Parrett, Fonnie Mu, NP  ALPRAZolam (XANAX) 0.25 MG tablet Take 1 tablet (0.25 mg total) by mouth 2 (two)  times daily as needed for anxiety. 10/03/20   Salley Slaughter, NP  amphetamine-dextroamphetamine (ADDERALL XR) 20 MG 24 hr capsule Take 1 capsule (20 mg total) by mouth as needed. 12/17/20   Nwoko, Terese Door, PA  beclomethasone (QVAR) 80 MCG/ACT inhaler Inhale 2 puffs into the lungs 2 (two) times daily. 01/23/21   Parrett, Fonnie Mu, NP  dextromethorphan-guaiFENesin (MUCINEX DM) 30-600 MG 12hr tablet Take 1 tablet by mouth 2 (two) times daily. 01/01/21   Hazel Sams, PA-C  ferrous sulfate (FER-IN-SOL) 75 (15 Fe) MG/ML SOLN Take 0.8 mLs (60 mg total) by mouth 2 (two) times daily. With food 07/22/20 08/21/20  Esterwood, Amy S, PA-C  GAVILAX 17 GM/SCOOP powder Take 17 g by mouth daily. 05/20/20   [provider]  guaiFENesin-codeine (ROBITUSSIN AC) 100-10 MG/5ML syrup Take by mouth. 01/21/21   [provider]  HYDROcodone-acetaminophen (NORCO/VICODIN) 5-325 MG tablet Take 1 tablet by mouth every 4 (four) hours as needed for moderate pain. Patient not taking: Reported on 01/23/2021 08/16/20   Lavonia Drafts, MD  metFORMIN (GLUCOPHAGE) 500 MG tablet Take 500 mg by mouth 2 (two) times daily with a meal.    [provider]  ondansetron (ZOFRAN) 4 MG tablet Take 4 mg by mouth every 8 (eight) hours as needed. 05/13/20   [provider]  pantoprazole (PROTONIX) 40 MG tablet Take 1 tablet (40 mg total) by mouth 2 (two) times daily for 14 days. 09/15/20 09/29/20  Lavonia Drafts, MD  potassium chloride SA (KLOR-CON) 20 MEQ tablet Take 1 tablet (20 mEq total) by mouth 2 (two) times daily. 07/23/20   Esterwood, Amy S, PA-C  promethazine (PROMETHEGAN) 12.5 MG suppository Place 1 suppository (12.5 mg total) rectally every 6 (six) hours as needed for nausea or vomiting. 08/16/20   Lavonia Drafts, MD  promethazine-dextromethorphan (PROMETHAZINE-DM) 6.25-15 MG/5ML syrup Take 5 mLs by mouth 4 (four) times daily as needed for cough. 01/01/21   Hazel Sams, PA-C  SUMAtriptan (IMITREX) 100 MG  tablet TAKE 1 TABLET BY MOUTH 2X DAILY AS NEEDED FOR MIGRAINE. MAY REPEAT IN 2 HOURS IF HEADACHE PERSISTS OR RECURS. 12/11/19   Kathrynn Ducking, MD  traMADol (ULTRAM) 50 MG tablet Take 50 mg by mouth every 6 (six) hours as needed for moderate pain.  03/27/20   [provider]  traZODone (DESYREL) 50 MG tablet Take 1 tablet (50 mg total) by mouth at bedtime as needed for sleep. 10/03/20   Salley Slaughter, NP  chlorthalidone (HYGROTON) 25 MG tablet TAKE 1 TABLET BY MOUTH EVERY DAY Patient taking differently: Take 25 mg by mouth daily.  11/05/19 01/01/21  Ngetich, Nelda Bucks, NP    Allergies    Aspirin  Review of Systems   Review of Systems  Constitutional: Positive for activity change.  Respiratory: Negative for shortness of breath.   Cardiovascular: Negative for chest pain.  Gastrointestinal: Positive for abdominal pain. Negative for blood in stool, constipation, diarrhea and vomiting.  Allergic/Immunologic: Negative for immunocompromised state.  All other systems reviewed and are negative.   Physical Exam Updated Vital Signs BP 130/90   Pulse (!) 58   Temp 98.2 F (36.8 C)   Resp (!) 26   LMP 02/19/2016   SpO2 100%   Physical Exam Vitals and nursing note reviewed.  Constitutional:      Appearance: She is well-developed.  HENT:     Head: Normocephalic and atraumatic.  Eyes:     Extraocular Movements: EOM normal.  Cardiovascular:     Rate and Rhythm: Normal rate.  Pulmonary:     Effort: Pulmonary effort is normal.  Abdominal:     General: Bowel sounds are normal.     Tenderness: There is generalized abdominal tenderness and tenderness in the right upper quadrant, epigastric area and left upper quadrant. There is no rebound. Negative signs include Murphy's sign.  Musculoskeletal:     Cervical back: Normal range of motion and neck supple.  Skin:    General: Skin is warm and dry.  Neurological:     Mental Status: She is alert and oriented to person, place, and time.      ED Results / Procedures / Treatments   Labs (all labs ordered are listed, but only abnormal results are displayed) Labs Reviewed  COMPREHENSIVE METABOLIC PANEL - Abnormal; Notable for the following components:      Result Value   CO2 21 (*)    Calcium 8.6 (*)    Total Protein 6.0 (*)    Albumin 3.3 (*)    All other components within normal limits  CBC - Abnormal; Notable for the following components:   WBC 3.8 (*)    Hemoglobin 11.6 (*)    MCHC 29.3 (*)    All other components within normal limits  URINALYSIS, ROUTINE W REFLEX MICROSCOPIC - Abnormal; Notable for the following components:   Color, Urine AMBER (*)    APPearance HAZY (*)    All other components within normal limits  LIPASE, BLOOD  I-STAT BETA HCG BLOOD, ED ([facility], [facility], [facility] ONLY)    EKG None  Radiology No results found.  Procedures Procedures   Medications Ordered in ED Medications  HYDROmorphone (DILAUDID) injection 1 mg (1 mg Intramuscular Given 02/10/21 0842)  sucralfate (CARAFATE) tablet 1 g (1 g Oral Given 02/10/21 0841)  pantoprazole (PROTONIX)  EC tablet 80 mg (80 mg Oral Given 02/10/21 0841)    ED Course  I have reviewed the triage vital signs and the nursing notes.  Pertinent labs & imaging results that were available during my care of the patient were reviewed by me and considered in my medical decision making (see chart for details).    MDM Rules/Calculators/A&P                          45 year old female comes in to the ER with chief complaint of epigastric abdominal pain.  She is status post gastric bypass surgery and was diagnosed with H. Pylori gastritis last year.  She is having epigastric pain similar to her prior PUD pain.  Of note, the pain is no association with p.o. intake and has been constant.  The exam is not consistent with peritonitis.  Negative Murphy's.  Suspicion is high that this pain is related to gastritis.  I did send a message to [facility], who patient has seen  before.  I have reviewed the previous upper endoscopies.  Sarah from [facility] recommends that patient follow-up in the clinic.  For now they want to ensure that patient has completed her H. pylori treatment and to add famotidine if needed.  They also have asked [place] to consider ultrasound to rule out cholelithiasis that could be masked.  Final Clinical Impression(s) / ED Diagnoses Final diagnoses:  Epigastric abdominal pain  PUD (peptic ulcer disease)    Rx / DC Orders ED Discharge Orders    None       Varney Biles, MD 02/10/21 [phone]

## 2021-02-12 ENCOUNTER — Encounter (HOSPITAL_COMMUNITY): Payer: Self-pay

## 2021-02-12 ENCOUNTER — Other Ambulatory Visit: Payer: Self-pay

## 2021-02-12 ENCOUNTER — Emergency Department (HOSPITAL_COMMUNITY)
Admission: EM | Admit: 2021-02-12 | Discharge: 2021-02-12 | Disposition: A | Discharge status: Home / Self Care | Payer: Medicaid Other | Attending: Emergency Medicine | Admitting: Emergency Medicine

## 2021-02-12 DIAGNOSIS — G43909 Migraine, unspecified, not intractable, without status migrainosus: Secondary | ICD-10-CM | POA: Insufficient documentation

## 2021-02-12 DIAGNOSIS — E1122 Type 2 diabetes mellitus with diabetic chronic kidney disease: Secondary | ICD-10-CM | POA: Insufficient documentation

## 2021-02-12 DIAGNOSIS — R1013 Epigastric pain: Secondary | ICD-10-CM

## 2021-02-12 DIAGNOSIS — I129 Hypertensive chronic kidney disease with stage 1 through stage 4 chronic kidney disease, or unspecified chronic kidney disease: Secondary | ICD-10-CM | POA: Insufficient documentation

## 2021-02-12 DIAGNOSIS — J45909 Unspecified asthma, uncomplicated: Secondary | ICD-10-CM | POA: Insufficient documentation

## 2021-02-12 DIAGNOSIS — K219 Gastro-esophageal reflux disease without esophagitis: Secondary | ICD-10-CM | POA: Insufficient documentation

## 2021-02-12 DIAGNOSIS — Z87891 Personal history of nicotine dependence: Secondary | ICD-10-CM | POA: Insufficient documentation

## 2021-02-12 DIAGNOSIS — N189 Chronic kidney disease, unspecified: Secondary | ICD-10-CM | POA: Insufficient documentation

## 2021-02-12 MED ORDER — METOCLOPRAMIDE HCL 10 MG PO TABS: 10.0000 mg | ORAL_TABLET | Freq: Three times a day (TID) | ORAL | 1 refills | Status: DC

## 2021-02-12 NOTE — ED Provider Notes (Signed)
[facility] Emergency Department Provider Note MRN:  [id number]  Arrival date & time: 02/12/21     Chief Complaint   Abdominal Pain and Migraine   History of Present Illness   Victoria Moore is a 45 y.o. year-old female with a history of gastric bypass presenting to the ED with chief complaint of abdominal pain.  Patient is endorsing upper abdominal pain since April.  Worsening over the past few weeks.  Described as a burning pain after meals.  Denies lower abdominal pain, no dysuria or hematuria, normal bowel movements.  Nausea but no vomiting.  Has been taking home medications but they do not seem to be helping.  Has been trying to call her surgeon but no answer.  Review of Systems  A complete 10 system review of systems was obtained and all systems are negative except as noted in the HPI and PMH.   Patient's Health History    Past Medical History:  Diagnosis Date  . Acute low back pain without sciatica 06/22/2018  . ADHD   . Allergy   . Anemia   . Anxiety   . Arthritis   . 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 Fibromyalgia, doubt sarcoid   . Asthma   . Blood transfusion without reported diagnosis   . Chest wall pain 05/26/2018  . Chronic kidney disease   . Chronic low back pain with sciatica 09/15/2018  . Common migraine with intractable migraine 10/07/2016  . Cough variant asthma vs UACS  10/23/2016   - Spirometry 04/15/2016  Very truncated exp loop effort dep portion only  - Allergy profile 10/22/2016 >  Eos 0.2/  IgE  52 RAST POS grass/trees/ragweed  10/22/2016  After extensive coaching HFA effectiveness =    75% try duelra 100 2bid > improved  . Daytime sleepiness 05/26/2018  . Decreased pedal pulses 10/04/2018  . Depression   . Depression, major, single episode, moderate ([place]) 05/26/2018  . Diabetes ([place])   . Dyspnea 04/15/2016   04/15/2016  Walked RA x 3 laps @ 185 ft each stopped due to  End of  study, nl pace, no sob or desat    - Spirometry 04/15/2016  Very truncated exp loop effort dep portion only  - 10/22/2016  Walked RA x 3 laps @ 185 ft each stopped due to  End of study, slow pace, min sob/ no desat - full pfts rec 10/22/2016 >>>      . Edema   . Essential hypertension, benign 11/15/2016  . Fibroids 03/11/2016  . Frequency of urination 09/11/2018  . GERD (gastroesophageal reflux disease)   . Herniated lumbar intervertebral disc   . Hiatal hernia   . Hilar adenopathy   . Hypertension   . Hypokalemia 06/28/2018  . Impaired fasting blood sugar 09/11/2018  . Iron deficiency   . Leg pain 12/07/2018  . Low libido 11/27/2018  . Migraine   . Mild sleep apnea 08/03/2018   HST 06/20/18  AHI  8.1 / snoring with 02 nadir 80% >  08/03/2018 rec sleep medicine consultation   . Morbid (severe) obesity due to excess calories ([place]) 10/23/2016  . Personal history of sarcoidosis 05/26/2018  . Prolonged capillary refill time   . Right leg swelling 08/18/2018  . Sarcoidosis    personal history of  . Seizures ([place])    history of   . Sleep apnea   . Spondylosis   . Vitamin D  deficiency     Past Surgical History:  Procedure Laterality Date  . ABDOMINAL HYSTERECTOMY    . ESOPHAGEAL MANOMETRY N/A 09/05/2019   Procedure: ESOPHAGEAL MANOMETRY (EM);  Surgeon: Lavena Bullion, DO;  [facility];  Service: Gastroenterology;  Laterality: N/A;  . LAPAROSCOPIC OVARIAN CYSTECTOMY Left 03/11/2016   Procedure: LAPAROSCOPIC OVARIAN CYSTECTOMY;  Surgeon: Eldred Manges, MD;  Location: [facility];  Service: Gynecology;  Laterality: Left;  . LAPAROSCOPIC VAGINAL HYSTERECTOMY WITH SALPINGECTOMY Bilateral 03/11/2016   Procedure: LAPAROSCOPIC ASSISTED VAGINAL HYSTERECTOMY WITH SALPINGECTOMY;  Surgeon: Eldred Manges, MD;  Location: [facility];  Service: Gynecology;  Laterality: Bilateral;  . [place] IMPEDANCE STUDY  09/05/2019   Procedure: [place] IMPEDANCE STUDY;  Surgeon: Lavena Bullion, DO;  [facility];   Service: Gastroenterology;;  . TUBAL LIGATION    . UPPER GASTROINTESTINAL ENDOSCOPY  10/11/2019   06/2019    Family History  Adopted: Yes  Problem Relation Age of Onset  . Other Son        Growing pains  . Asthma Mother   . Heart Problems Mother   . Migraines Mother   . COPD Mother   . Heart attack Mother   . Diabetes Father   . Peptic Ulcer Father   . Heart Problems Father   . COPD Father   . Heart attack Father   . Colon cancer Neg Hx   . Colon polyps Neg Hx   . Esophageal cancer Neg Hx   . Rectal cancer Neg Hx   . Stomach cancer Neg Hx     Social History   Socioeconomic History  . Marital status: Significant Other    Spouse name: Not on file  . Number of children: 1  . Years of education: 66  . Highest education level: Not on file  Occupational History  . Occupation: Company secretary  Tobacco Use  . Smoking status: Former Smoker    Packs/day: 0.25    Years: 17.00    Pack years: 4.25    Types: Cigarettes    Quit date: 03/02/2013    Years since quitting: 7.9  . Smokeless tobacco: Never Used  Vaping Use  . Vaping Use: Never used  Substance and Sexual Activity  . Alcohol use: No    Alcohol/week: 0.0 standard drinks  . Drug use: Not Currently    Types: Marijuana    Comment: former - 6 yrs ago  . Sexual activity: Yes    Birth control/protection: Surgical  Other Topics Concern  . Not on file  Social History Narrative   She lives w/ her fiance'. She has one son.    Highest level of education:  12th grade, did not graduate. Currently back in school.   Right-handed   Caffeine: 1 cup of coffee some mornings      Social History      Diet? No       Do you drink/eat things with caffeine? yes      Marital status?           Single                          What year were you married?       Do you live in a house, apartment, assisted living, condo, trailer, etc.? house      Is it one or more stories?     How many persons live in your home?  2  Do you have any  pets in your home? (please list)   No       Highest level of education completed? Some college       Current or past profession:       Do you exercise?       Yes                                Type & how often? Daily       Advanced Directives      Do you have a living will? No       Do you have a DNR form?                                  If not, do you want to discuss one? No       Do you have signed POA/HPOA for forms?  No       Functional Status      Do you have difficulty bathing or dressing yourself? No       Do you have difficulty preparing food or eating? No       Do you have difficulty managing your medications? No       Do you have difficulty managing your finances? No       Do you have difficulty affording your medications? No       Social Determinants of Health   Financial Resource Strain: Not on file  Food Insecurity: Not on file  Transportation Needs: Not on file  Physical Activity: Not on file  Stress: Not on file  Social Connections: Not on file  Intimate Partner Violence: Not on file     Physical Exam   Vitals:   02/12/21 2154 02/12/21 2254  BP: 127/88 103/71  Pulse: 86 61  Resp: 18 18  Temp: 98.3 F (36.8 C)   SpO2: 100% 100%    CONSTITUTIONAL: Well-appearing, NAD NEURO:  Alert and oriented x 3, no focal deficits EYES:  eyes equal and reactive ENT/NECK:  no LAD, no JVD CARDIO: Regular rate, well-perfused, normal S1 and S2 PULM:  CTAB no wheezing or rhonchi GI/GU:  normal bowel sounds, non-distended, non-tender MSK/SPINE:  No gross deformities, no edema SKIN:  no rash, atraumatic PSYCH:  Appropriate speech and behavior  *Additional and/or pertinent findings included in MDM below  Diagnostic and Interventional Summary    EKG Interpretation  Date/Time:    Ventricular Rate:    PR Interval:    QRS Duration:   QT Interval:    QTC Calculation:   R Axis:     Text Interpretation:        Labs Reviewed - No data to display  No  orders to display    Medications - No data to display   Procedures  /  Critical Care Procedures  ED Course and Medical Decision Making  I have reviewed the triage vital signs, the nursing notes, and pertinent available records from the EMR.  Listed above are laboratory and imaging tests that I personally ordered, reviewed, and interpreted and then considered in my medical decision making (see below for details).  Chronic abdominal pain that historically seems consistent with a gastritis or peptic ulcer disease.  Patient is taking Protonix, recently Carafate and Pepcid were added on during ED visit.  Vital signs are completely normal, patient is  well-appearing with a soft and nontender abdomen.  Little to no concern for acute surgical emergency, recent laboratory and ultrasound evaluation was reassuring.  Shared decision-making utilized, CT abdomen offered but we decided to focus more on symptoms and try to establish GI follow-up.  Strict return precautions for worsening symptoms.       Barth Kirks. Sedonia Small, [place] [email or web address]  Final Clinical Impressions(s) / ED Diagnoses     ICD-10-CM   1. Epigastric pain  R10.13     ED Discharge Orders         Ordered    metoCLOPramide (REGLAN) 10 MG tablet  3 times daily before meals        02/12/21 2333    Ambulatory referral to Gastroenterology        02/12/21 2334           Discharge Instructions Discussed with and Provided to Patient:     Discharge Instructions     You were evaluated in the Emergency Department and after careful evaluation, we did not find any emergent condition requiring admission or further testing in the hospital.  Your exam/testing today was overall reassuring.  We recommend continuing to take your home medications.  You can use the new Reglan prescription as directed to see if this helps.  Recommend following up with her surgeon and  gastroenterology.  Please return to the Emergency Department if you experience any worsening of your condition.  Thank you for allowing [place] to be a part of your care.       Maudie Flakes, MD 02/12/21 2337

## 2021-02-12 NOTE — Discharge Instructions (Signed)
You were evaluated in the Emergency Department and after careful evaluation, we did not find any emergent condition requiring admission or further testing in the hospital.  Your exam/testing today was overall reassuring.  We recommend continuing to take your home medications.  You can use the new Reglan prescription as directed to see if this helps.  Recommend following up with her surgeon and gastroenterology.  Please return to the Emergency Department if you experience any worsening of your condition.  Thank you for allowing [place] to be a part of your care.

## 2021-02-12 NOTE — ED Notes (Signed)
Pt. Made aware for the need of urine specimen. 

## 2021-02-12 NOTE — ED Triage Notes (Signed)
Pt states she went to ER on Monday and was told to come back if her symptoms became worse. Pt states her abdominal and back pain have become worse especially when using the bathroom.

## 2021-02-13 NOTE — ED Notes (Signed)
Opened chart to print work note

## 2021-02-27 ENCOUNTER — Encounter: Payer: Self-pay | Admitting: Internal Medicine

## 2021-03-03 ENCOUNTER — Ambulatory Visit: Payer: Self-pay | Admitting: Gastroenterology

## 2021-04-23 ENCOUNTER — Ambulatory Visit: Payer: Self-pay | Admitting: Internal Medicine

## 2021-04-24 ENCOUNTER — Ambulatory Visit: Payer: Self-pay | Admitting: Internal Medicine

## 2021-05-07 ENCOUNTER — Ambulatory Visit
Admission: EM | Admit: 2021-05-07 | Discharge: 2021-05-07 | Disposition: A | Discharge status: Home / Self Care | Payer: 59 | Attending: Family Medicine | Admitting: Family Medicine

## 2021-05-07 ENCOUNTER — Encounter: Payer: Self-pay | Admitting: Emergency Medicine

## 2021-05-07 ENCOUNTER — Other Ambulatory Visit: Payer: Self-pay

## 2021-05-07 DIAGNOSIS — Z20822 Contact with and (suspected) exposure to covid-19: Secondary | ICD-10-CM | POA: Diagnosis not present

## 2021-05-07 DIAGNOSIS — Z1152 Encounter for screening for COVID-19: Secondary | ICD-10-CM

## 2021-05-07 DIAGNOSIS — J069 Acute upper respiratory infection, unspecified: Secondary | ICD-10-CM | POA: Diagnosis not present

## 2021-05-07 MED ORDER — PSEUDOEPH-BROMPHEN-DM 30-2-10 MG/5ML PO SYRP: 5.0000 mL | ORAL_SOLUTION | Freq: Four times a day (QID) | ORAL | 0 refills | Status: DC | PRN

## 2021-05-07 NOTE — ED Provider Notes (Signed)
Victoria Moore   [id number] 05/07/21 Arrival Time: 1237  ASSESSMENT & PLAN:  1. Encounter for screening laboratory testing for COVID-19 virus   2. Viral URI with cough    COVID-19 testing sent.  OTC symptom care as needed.  Meds ordered this encounter  Medications  . brompheniramine-pseudoephedrine-DM 30-2-10 MG/5ML syrup    Sig: Take 5 mLs by mouth 4 (four) times daily as needed.    Dispense:  120 mL    Refill:  0     Follow-up Information    [name], MD.   Specialty: Internal Medicine Why: If worsening or failing to improve as anticipated. Contact information: [place] [address] [phone]               Reviewed expectations re: course of current medical issues. Questions answered. Outlined signs and symptoms indicating need for more acute intervention. Understanding verbalized. After Visit Summary given.   SUBJECTIVE: History from: patient. Victoria Moore is a 45 y.o. female who presents with worries regarding COVID-19. Known COVID-19 contact: none. Recent travel: none. Reports: cough and body aches; x 2 days. Denies: fever and difficulty breathing. Normal PO intake without n/v/d.  OBJECTIVE:  Vitals:   05/07/21 1334  BP: 138/90  Pulse: 67  Resp: 18  Temp: 98.9 F (37.2 C)  TempSrc: Oral  SpO2: 99%    General appearance: alert; no distress; appears fatigued Eyes: PERRLA; EOMI; conjunctiva normal HENT: [place]; AT; with nasal congestion Neck: supple  Lungs: speaks full sentences without difficulty; unlabored Extremities: no edema Skin: warm and dry Neurologic: normal gait Psychological: alert and cooperative; normal mood and affect  Labs: Labs Reviewed  NOVEL CORONAVIRUS, NAA   Allergies  Allergen Reactions  . Aspirin Anaphylaxis and Hives         Past Medical History:  Diagnosis Date  . Acute low back pain without sciatica 06/22/2018  . ADHD   . Allergy   . Anemia   . Anxiety   . Arthritis    . 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 Fibromyalgia, doubt sarcoid   . Asthma   . Blood transfusion without reported diagnosis   . Chest wall pain 05/26/2018  . Chronic kidney disease   . Chronic low back pain with sciatica 09/15/2018  . Common migraine with intractable migraine 10/07/2016  . Cough variant asthma vs UACS  10/23/2016   - Spirometry 04/15/2016  Very truncated exp loop effort dep portion only  - Allergy profile 10/22/2016 >  Eos 0.2/  IgE  52 RAST POS grass/trees/ragweed  10/22/2016  After extensive coaching HFA effectiveness =    75% try duelra 100 2bid > improved  . Daytime sleepiness 05/26/2018  . Decreased pedal pulses 10/04/2018  . Depression   . Depression, major, single episode, moderate ([place]) 05/26/2018  . Diabetes ([place])   . Dyspnea 04/15/2016   04/15/2016  Walked RA x 3 laps @ 185 ft each stopped due to  End of study, nl pace, no sob or desat    - Spirometry 04/15/2016  Very truncated exp loop effort dep portion only  - 10/22/2016  Walked RA x 3 laps @ 185 ft each stopped due to  End of study, slow pace, min sob/ no desat - full pfts rec 10/22/2016 >>>      . Edema   . Essential hypertension, benign 11/15/2016  . Fibroids 03/11/2016  . Frequency of urination 09/11/2018  .  GERD (gastroesophageal reflux disease)   . Herniated lumbar intervertebral disc   . Hiatal hernia   . Hilar adenopathy   . Hypertension   . Hypokalemia 06/28/2018  . Impaired fasting blood sugar 09/11/2018  . Iron deficiency   . Leg pain 12/07/2018  . Low libido 11/27/2018  . Migraine   . Mild sleep apnea 08/03/2018   HST 06/20/18  AHI  8.1 / snoring with 02 nadir 80% >  08/03/2018 rec sleep medicine consultation   . Morbid (severe) obesity due to excess calories ([place]) 10/23/2016  . Personal history of sarcoidosis 05/26/2018  . Prolonged capillary refill time   . Right leg swelling 08/18/2018  . Sarcoidosis    personal history of  . Seizures ([place])     history of   . Sleep apnea   . Spondylosis   . Vitamin D deficiency    Social History   Socioeconomic History  . Marital status: Significant Other    Spouse name: Not on file  . Number of children: 1  . Years of education: 45  . Highest education level: Not on file  Occupational History  . Occupation: Company secretary  Tobacco Use  . Smoking status: Former Smoker    Packs/day: 0.25    Years: 17.00    Pack years: 4.25    Types: Cigarettes    Quit date: 03/02/2013    Years since quitting: 8.1  . Smokeless tobacco: Never Used  Vaping Use  . Vaping Use: Never used  Substance and Sexual Activity  . Alcohol use: No    Alcohol/week: 0.0 standard drinks  . Drug use: Not Currently    Types: Marijuana    Comment: former - 6 yrs ago  . Sexual activity: Yes    Birth control/protection: Surgical  Other Topics Concern  . Not on file  Social History Narrative   She lives w/ her fiance'. She has one son.    Highest level of education:  12th grade, did not graduate. Currently back in school.   Right-handed   Caffeine: 1 cup of coffee some mornings      Social History      Diet? No       Do you drink/eat things with caffeine? yes      Marital status?           Single                          What year were you married?       Do you live in a house, apartment, assisted living, condo, trailer, etc.? house      Is it one or more stories?     How many persons live in your home?  2      Do you have any pets in your home? (please list)   No       Highest level of education completed? Some college       Current or past profession:       Do you exercise?       Yes                                Type & how often? Daily       Advanced Directives      Do you have a living will? No       Do you have a  DNR form?                                  If not, do you want to discuss one? No       Do you have signed POA/HPOA for forms?  No       Functional Status      Do you have difficulty  bathing or dressing yourself? No       Do you have difficulty preparing food or eating? No       Do you have difficulty managing your medications? No       Do you have difficulty managing your finances? No       Do you have difficulty affording your medications? No       Social Determinants of Radio broadcast assistant Strain: Not on file  Food Insecurity: Not on file  Transportation Needs: Not on file  Physical Activity: Not on file  Stress: Not on file  Social Connections: Not on file  Intimate Partner Violence: Not on file   Family History  Adopted: Yes  Problem Relation Age of Onset  . Other Son        Growing pains  . Asthma Mother   . Heart Problems Mother   . Migraines Mother   . COPD Mother   . Heart attack Mother   . Diabetes Father   . Peptic Ulcer Father   . Heart Problems Father   . COPD Father   . Heart attack Father   . Colon cancer Neg Hx   . Colon polyps Neg Hx   . Esophageal cancer Neg Hx   . Rectal cancer Neg Hx   . Stomach cancer Neg Hx    Past Surgical History:  Procedure Laterality Date  . ABDOMINAL HYSTERECTOMY    . ESOPHAGEAL MANOMETRY N/A 09/05/2019   Procedure: ESOPHAGEAL MANOMETRY (EM);  Surgeon: Lavena Bullion, DO;  Location: [facility];  Service: Gastroenterology;  Laterality: N/A;  . LAPAROSCOPIC OVARIAN CYSTECTOMY Left 03/11/2016   Procedure: LAPAROSCOPIC OVARIAN CYSTECTOMY;  Surgeon: Eldred Manges, MD;  Location: [facility];  Service: Gynecology;  Laterality: Left;  . LAPAROSCOPIC VAGINAL HYSTERECTOMY WITH SALPINGECTOMY Bilateral 03/11/2016   Procedure: LAPAROSCOPIC ASSISTED VAGINAL HYSTERECTOMY WITH SALPINGECTOMY;  Surgeon: Eldred Manges, MD;  Location: [facility];  Service: Gynecology;  Laterality: Bilateral;  . [place] IMPEDANCE STUDY  09/05/2019   Procedure: [place] IMPEDANCE STUDY;  Surgeon: Lavena Bullion, DO;  Location: [facility];  Service: Gastroenterology;;  . TUBAL LIGATION    . UPPER GASTROINTESTINAL ENDOSCOPY  10/11/2019    06/2019     Vanessa Kick, MD 05/07/21 1452

## 2021-05-07 NOTE — ED Triage Notes (Signed)
Pt here for cough and body aches x 2 days; pt sts pain with cough

## 2021-05-09 LAB — SARS-COV-2, NAA 2 DAY TAT

## 2021-05-09 LAB — NOVEL CORONAVIRUS, NAA: SARS-CoV-2, NAA: NOT DETECTED

## 2021-05-11 ENCOUNTER — Ambulatory Visit
Admission: RE | Admit: 2021-05-11 | Discharge: 2021-05-11 | Disposition: A | Discharge status: Home / Self Care | Payer: 59 | Source: Ambulatory Visit | Attending: Family Medicine | Admitting: Family Medicine

## 2021-05-11 ENCOUNTER — Other Ambulatory Visit: Payer: Self-pay

## 2021-05-11 ENCOUNTER — Ambulatory Visit: Payer: Self-pay

## 2021-05-11 VITALS — BP 143/95 | HR 83 | Temp 98.2°F | Resp 18

## 2021-05-11 DIAGNOSIS — E860 Dehydration: Secondary | ICD-10-CM

## 2021-05-11 DIAGNOSIS — A084 Viral intestinal infection, unspecified: Secondary | ICD-10-CM | POA: Diagnosis not present

## 2021-05-11 DIAGNOSIS — G43819 Other migraine, intractable, without status migrainosus: Secondary | ICD-10-CM | POA: Diagnosis not present

## 2021-05-11 LAB — POCT URINALYSIS DIP (MANUAL ENTRY)
Bilirubin, UA: NEGATIVE
Blood, UA: NEGATIVE
Glucose, UA: NEGATIVE mg/dL
Ketones, POC UA: NEGATIVE mg/dL
Leukocytes, UA: NEGATIVE
Nitrite, UA: NEGATIVE
Protein Ur, POC: NEGATIVE mg/dL
Spec Grav, UA: 1.02 (ref 1.010–1.025)
Urobilinogen, UA: 4 E.U./dL — AB
pH, UA: 7 (ref 5.0–8.0)

## 2021-05-11 MED ORDER — KETOROLAC TROMETHAMINE 30 MG/ML IJ SOLN
30.0000 mg | Freq: Once | INTRAMUSCULAR | Status: AC
  Administered 2021-05-11: 30 mg via INTRAMUSCULAR

## 2021-05-11 MED ORDER — DIPHENOXYLATE-ATROPINE 2.5-0.025 MG PO TABS: 1.0000 | ORAL_TABLET | Freq: Four times a day (QID) | ORAL | 0 refills | Status: DC | PRN

## 2021-05-11 NOTE — Discharge Instructions (Signed)
Resume Carafate to help improve abdominal pain. Lomotil as needed for loose stools. Hydrate well with fluids as urine indicates mild dehydration.  Advance diet as tolerated however avoid any spicy, fried or oily foods as this can worsen symptoms.

## 2021-05-11 NOTE — ED Triage Notes (Signed)
Pt presents with c/o dizziness and diarrhea  that began on thursday

## 2021-05-11 NOTE — ED Provider Notes (Signed)
[facility]    CSN: [id number] Arrival date & time: 05/11/21  1248      History   Chief Complaint Chief Complaint  Patient presents with  . Diarrhea  . Dizziness    HPI Victoria Moore is a 45 y.o. female.   HPI  Patient with a history of gastric bypass surgery 2022, peptic ulcer disease, chronic headaches presents today with diarrhea and dizziness.  Symptoms of diarrhea, dizziness and feeling generally unwell x1 day.  She was seenfor COVID 19 test on 5/12, with negative result after experiencing a cough.  She continues to have a mild cough.  Endorses a low-grade fever at bedtime.  She denies any nausea or vomiting.   Past Medical History:  Diagnosis Date  . Acute low back pain without sciatica 06/22/2018  . ADHD   . Allergy   . Anemia   . Anxiety   . Arthritis   . 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 Fibromyalgia, doubt sarcoid   . Asthma   . Blood transfusion without reported diagnosis   . Chest wall pain 05/26/2018  . Chronic kidney disease   . Chronic low back pain with sciatica 09/15/2018  . Common migraine with intractable migraine 10/07/2016  . Cough variant asthma vs UACS  10/23/2016   - Spirometry 04/15/2016  Very truncated exp loop effort dep portion only  - Allergy profile 10/22/2016 >  Eos 0.2/  IgE  52 RAST POS grass/trees/ragweed  10/22/2016  After extensive coaching HFA effectiveness =    75% try duelra 100 2bid > improved  . Daytime sleepiness 05/26/2018  . Decreased pedal pulses 10/04/2018  . Depression   . Depression, major, single episode, moderate ([place]) 05/26/2018  . Diabetes ([place])   . Dyspnea 04/15/2016   04/15/2016  Walked RA x 3 laps @ 185 ft each stopped due to  End of study, nl pace, no sob or desat    - Spirometry 04/15/2016  Very truncated exp loop effort dep portion only  - 10/22/2016  Walked RA x 3 laps @ 185 ft each stopped due to  End of study, slow pace, min sob/ no desat - full pfts  rec 10/22/2016 >>>      . Edema   . Essential hypertension, benign 11/15/2016  . Fibroids 03/11/2016  . Frequency of urination 09/11/2018  . GERD (gastroesophageal reflux disease)   . Herniated lumbar intervertebral disc   . Hiatal hernia   . Hilar adenopathy   . Hypertension   . Hypokalemia 06/28/2018  . Impaired fasting blood sugar 09/11/2018  . Iron deficiency   . Leg pain 12/07/2018  . Low libido 11/27/2018  . Migraine   . Mild sleep apnea 08/03/2018   HST 06/20/18  AHI  8.1 / snoring with 02 nadir 80% >  08/03/2018 rec sleep medicine consultation   . Morbid (severe) obesity due to excess calories ([place]) 10/23/2016  . Personal history of sarcoidosis 05/26/2018  . Prolonged capillary refill time   . Right leg swelling 08/18/2018  . Sarcoidosis    personal history of  . Seizures ([place])    history of   . Sleep apnea   . Spondylosis   . Vitamin D deficiency     Patient Active Problem List   Diagnosis Date Noted  . Asthmatic bronchitis 01/23/2021  . COVID-19 virus infection 01/23/2021  . Social anxiety disorder 10/03/2020  . Attention deficit hyperactivity disorder (ADHD), predominantly  inattentive type 10/03/2020  . Mild episode of recurrent major depressive disorder ([place]) 10/03/2020  . Constipation 05/20/2020  . S/P gastric bypass 05/12/2020  . Intractable abdominal pain 04/27/2020  . Benign intracranial hypertension 02/14/2020  . Myopia of both eyes 02/14/2020  . 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 BMI 45.0-49.9, adult ([place]) 11/29/2019  . Other intervertebral disc degeneration, lumbar region 09/19/2019  . Complex cyst of left ovary 02/01/2019  . Sarcoidosis 02/01/2019  . Leg pain 12/07/2018  . Low libido 11/27/2018  . Prolonged capillary refill time 10/04/2018  . Decreased pedal pulses 10/04/2018  . Spondylosis 09/15/2018  . Chronic low back pain with sciatica 09/15/2018  . Impaired fasting blood sugar 09/11/2018  . Weight gain 09/11/2018  . Mild sleep apnea 08/03/2018   . Hypokalemia 06/28/2018  . Medication side effect 06/22/2018  . Acute low back pain without sciatica 06/22/2018  . Personal history of sarcoidosis 05/26/2018  . Vitamin D deficiency 05/26/2018  . Encounter for health maintenance examination with abnormal findings 05/26/2018  . Depression, major, single episode, moderate ([place]) 05/26/2018  . Daytime sleepiness 05/26/2018  . Chest pain of uncertain etiology 11/91/4782  . GERD (gastroesophageal reflux disease) 05/26/2018  . Essential hypertension, benign 11/15/2016  . 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 Morbid (severe) obesity due to excess calories ([place]) 10/23/2016  . Common migraine with intractable migraine 10/07/2016  . DOE (dyspnea on exertion) 04/15/2016  . Hilar adenopathy 04/15/2016  . Seizures ([place]) 03/11/2016  . Anemia 11/12/2014  . Iron deficiency 11/12/2014    Past Surgical History:  Procedure Laterality Date  . ABDOMINAL HYSTERECTOMY    . ESOPHAGEAL MANOMETRY N/A 09/05/2019   Procedure: ESOPHAGEAL MANOMETRY (EM);  Surgeon: Lavena Bullion, DO;  [facility];  Service: Gastroenterology;  Laterality: N/A;  . LAPAROSCOPIC OVARIAN CYSTECTOMY Left 03/11/2016   Procedure: LAPAROSCOPIC OVARIAN CYSTECTOMY;  Surgeon: Eldred Manges, MD;  Location: [facility];  Service: Gynecology;  Laterality: Left;  . LAPAROSCOPIC VAGINAL HYSTERECTOMY WITH SALPINGECTOMY Bilateral 03/11/2016   Procedure: LAPAROSCOPIC ASSISTED VAGINAL HYSTERECTOMY WITH SALPINGECTOMY;  Surgeon: Eldred Manges, MD;  Location: [facility];  Service: Gynecology;  Laterality: Bilateral;  . [place] IMPEDANCE STUDY  09/05/2019   Procedure: [place] IMPEDANCE STUDY;  Surgeon: Lavena Bullion, DO;  [facility]: [facility];  Service: Gastroenterology;;  . TUBAL LIGATION    . UPPER GASTROINTESTINAL ENDOSCOPY  10/11/2019   06/2019    OB History    Gravida  1   Para      Term      Preterm      AB      Living        SAB      IAB      Ectopic      Multiple       Live Births               Home Medications    Prior to Admission medications   Medication Sig Start Date End Date Taking? Authorizing Provider  ACETAMINOPHEN EXTRA STRENGTH 500 MG tablet Take 1,000 mg by mouth every 6 (six) hours as needed for pain. 04/08/20   [provider]  albuterol (PROVENTIL) (2.5 MG/3ML) 0.083% nebulizer solution Take 2.5 mg by nebulization every 6 (six) hours as needed for wheezing or shortness of breath.    [provider]  albuterol (VENTOLIN HFA) 108 (90 Base) MCG/ACT inhaler Inhale 1-2 puffs into the lungs every 4 (four) hours as needed for wheezing or shortness  of breath. 01/23/21   Parrett, Fonnie Mu, NP  ALPRAZolam (XANAX) 0.25 MG tablet Take 1 tablet (0.25 mg total) by mouth 2 (two) times daily as needed for anxiety. 10/03/20   Salley Slaughter, NP  amphetamine-dextroamphetamine (ADDERALL XR) 20 MG 24 hr capsule Take 1 capsule (20 mg total) by mouth as needed. 12/17/20   Nwoko, Terese Door, PA  beclomethasone (QVAR) 80 MCG/ACT inhaler Inhale 2 puffs into the lungs 2 (two) times daily. 01/23/21   Parrett, Fonnie Mu, NP  brompheniramine-pseudoephedrine-DM 30-2-10 MG/5ML syrup Take 5 mLs by mouth 4 (four) times daily as needed. 05/07/21   Vanessa Kick, MD  dextromethorphan-guaiFENesin [facility] DM) 30-600 MG 12hr tablet Take 1 tablet by mouth 2 (two) times daily. 01/01/21   Hazel Sams, PA-C  famotidine (PEPCID) 20 MG tablet Take 1 tablet (20 mg total) by mouth 2 (two) times daily. 02/10/21   Varney Biles, MD  ferrous sulfate (FER-IN-SOL) 75 (15 Fe) MG/ML SOLN Take 0.8 mLs (60 mg total) by mouth 2 (two) times daily. With food 07/22/20 08/21/20  Esterwood, Amy S, PA-C  GAVILAX 17 GM/SCOOP powder Take 17 g by mouth daily. 05/20/20   [provider]  HYDROcodone-acetaminophen (NORCO/VICODIN) 5-325 MG tablet Take 1 tablet by mouth every 4 (four) hours as needed for moderate pain. Patient not taking: Reported on 01/23/2021 08/16/20   Lavonia Drafts, MD  metFORMIN (GLUCOPHAGE) 500 MG tablet Take 500 mg by mouth 2 (two) times daily with a meal.    [provider]  metoCLOPramide (REGLAN) 10 MG tablet Take 1 tablet (10 mg total) by mouth 3 (three) times daily before meals. 02/12/21   Maudie Flakes, MD  Nirmatrelvir & Ritonavir 20 x 150 MG & 10 x 100MG  TBPK TAKE 3 TABLETS BY MOUTH 2 TIMES DAILY FOR 5 DAYS. 01/02/21 01/02/22  [place] Callas, NP  ondansetron (ZOFRAN) 4 MG tablet Take 4 mg by mouth every 8 (eight) hours as needed. 05/13/20   [provider]  oxyCODONE-acetaminophen (PERCOCET/ROXICET) 5-325 MG tablet Take 2 tablets by mouth every 12 (twelve) hours as needed for severe pain. 02/10/21   Varney Biles, MD  pantoprazole (PROTONIX) 40 MG tablet Take 1 tablet (40 mg total) by mouth 2 (two) times daily for 14 days. 09/15/20 09/29/20  Lavonia Drafts, MD  potassium chloride SA (KLOR-CON) 20 MEQ tablet Take 1 tablet (20 mEq total) by mouth 2 (two) times daily. 07/23/20   Esterwood, Amy S, PA-C  promethazine (PROMETHEGAN) 12.5 MG suppository Place 1 suppository (12.5 mg total) rectally every 6 (six) hours as needed for nausea or vomiting. 08/16/20   Lavonia Drafts, MD  sucralfate (CARAFATE) 1 g tablet Take 1 tablet (1 g total) by mouth 4 (four) times daily -  with meals and at bedtime. 02/10/21   Varney Biles, MD  SUMAtriptan (IMITREX) 100 MG tablet TAKE 1 TABLET BY MOUTH 2X DAILY AS NEEDED FOR MIGRAINE. MAY REPEAT IN 2 HOURS IF HEADACHE PERSISTS OR RECURS. 12/11/19   Kathrynn Ducking, MD  traMADol (ULTRAM) 50 MG tablet Take 50 mg by mouth every 6 (six) hours as needed for moderate pain.  03/27/20   [provider]  traZODone (DESYREL) 50 MG tablet Take 1 tablet (50 mg total) by mouth at bedtime as needed for sleep. 10/03/20   Salley Slaughter, NP  chlorthalidone (HYGROTON) 25 MG tablet TAKE 1 TABLET BY MOUTH EVERY DAY Patient taking differently: Take 25 mg by mouth daily.  11/05/19 01/01/21  Ngetich, Nelda Bucks, NP  Family History Family History  Adopted: Yes  Problem Relation Age of Onset  . Other Son        Growing pains  . Asthma Mother   . Heart Problems Mother   . Migraines Mother   . COPD Mother   . Heart attack Mother   . Diabetes Father   . Peptic Ulcer Father   . Heart Problems Father   . COPD Father   . Heart attack Father   . Colon cancer Neg Hx   . Colon polyps Neg Hx   . Esophageal cancer Neg Hx   . Rectal cancer Neg Hx   . Stomach cancer Neg Hx     Social History Social History   Tobacco Use  . Smoking status: Former Smoker    Packs/day: 0.25    Years: 17.00    Pack years: 4.25    Types: Cigarettes    Quit date: 03/02/2013    Years since quitting: 8.1  . Smokeless tobacco: Never Used  Vaping Use  . Vaping Use: Never used  Substance Use Topics  . Alcohol use: No    Alcohol/week: 0.0 standard drinks  . Drug use: Not Currently    Types: Marijuana    Comment: former - 6 yrs ago     Allergies   Aspirin   Review of Systems Review of Systems Pertinent negatives listed in HPI  Physical Exam Triage Vital Signs ED Triage Vitals [05/11/21 1350]  Enc Vitals Group     BP (!) 143/95     Pulse Rate 83     Resp 18     Temp 98.2 F (36.8 C)     Temp Source Oral     SpO2 98 %     Weight      Height      Head Circumference      Peak Flow      Pain Score      Pain Loc      Pain Edu?      Excl. in [place]?    No data found.  Updated Vital Signs BP (!) 143/95   Pulse 83   Temp 98.2 F (36.8 C) (Oral)   Resp 18   LMP 02/19/2016   SpO2 98%   Visual Acuity Right Eye Distance:   Left Eye Distance:   Bilateral Distance:    Right Eye Near:   Left Eye Near:    Bilateral Near:     Physical Exam General appearance:Alert, acutely ill-appearing, cooperative, no distress Head: Normocephalic, without obvious abnormality, atraumatic Eyes: PERRLA, EOM Intact  Respiratory: Respirations even and unlabored, normal respiratory rate Heart: Rate and rhythm  normal. No gallop or murmurs noted on exam  Abdomen: BS hyperactive, epigastric tenderness generalized with deep palpation, no distention, no rebound tenderness, or no mass Extremities: No gross deformities Skin: Skin color, texture, turgor normal. No rashes seen  Psych: Appropriate mood and affect. Neurologic: GCS 15, normal coordination, normal gait, no acute neurological deficit obvious on exam   UC Treatments / Results  Labs (all labs ordered are listed, but only abnormal results are displayed) Labs Reviewed  POCT URINALYSIS DIP (MANUAL ENTRY)    EKG   Radiology No results found.  Procedures Procedures (including critical care time)  Medications Ordered in UC Medications - No data to display  Initial Impression / Assessment and Plan / UC Course  I have reviewed the triage vital signs and the nursing notes.  Pertinent labs & imaging results that  were available during my care of the patient were reviewed by me and considered in my medical decision making (see chart for details).    Viral gastroenteritis with dizziness and headache related to moderate dehydration due to poor oral intake.  Management of symptoms warranted at present.  Toradol IM given here in clinic for headache patient also advised to continue home migraine medication as needed.  Lomotil for diarrhea.  Recent COVID test is negative.  T Final Clinical Impressions(s) / UC Diagnoses   Final diagnoses:  Mild dehydration  Other migraine without status migrainosus, intractable  Viral gastroenteritis     Discharge Instructions     Resume Carafate to help improve abdominal pain. Lomotil as needed for loose stools. Hydrate well with fluids as urine indicates mild dehydration.  Advance diet as tolerated however avoid any spicy, fried or oily foods as this can worsen symptoms.    ED Prescriptions    Medication Sig Dispense Auth. Provider   diphenoxylate-atropine (LOMOTIL) 2.5-0.025 MG tablet Take 1-2  tablets by mouth 4 (four) times daily as needed for diarrhea or loose stools. 30 tablet Scot Jun, FNP     PDMP not reviewed this encounter.   Scot Jun, [place] 05/11/21 1431

## 2021-05-12 ENCOUNTER — Other Ambulatory Visit: Payer: Self-pay

## 2021-05-12 ENCOUNTER — Ambulatory Visit
Admission: RE | Admit: 2021-05-12 | Discharge: 2021-05-12 | Disposition: A | Discharge status: Home / Self Care | Payer: 59 | Source: Ambulatory Visit | Attending: Family Medicine | Admitting: Family Medicine

## 2021-05-12 VITALS — BP 118/83 | HR 71 | Temp 98.6°F | Resp 17

## 2021-05-12 DIAGNOSIS — J069 Acute upper respiratory infection, unspecified: Secondary | ICD-10-CM | POA: Diagnosis not present

## 2021-05-12 DIAGNOSIS — R509 Fever, unspecified: Secondary | ICD-10-CM

## 2021-05-12 DIAGNOSIS — G43801 Other migraine, not intractable, with status migrainosus: Secondary | ICD-10-CM | POA: Diagnosis not present

## 2021-05-12 DIAGNOSIS — R52 Pain, unspecified: Secondary | ICD-10-CM

## 2021-05-12 DIAGNOSIS — R5383 Other fatigue: Secondary | ICD-10-CM

## 2021-05-12 MED ORDER — SUMATRIPTAN SUCCINATE 6 MG/0.5ML ~~LOC~~ SOLN
6.0000 mg | Freq: Once | SUBCUTANEOUS | Status: AC
  Administered 2021-05-12: 6 mg via SUBCUTANEOUS

## 2021-05-12 MED ORDER — BENZONATATE 100 MG PO CAPS: 100.0000 mg | ORAL_CAPSULE | Freq: Three times a day (TID) | ORAL | 0 refills | Status: DC

## 2021-05-12 MED ORDER — DEXAMETHASONE SODIUM PHOSPHATE 10 MG/ML IJ SOLN
10.0000 mg | Freq: Once | INTRAMUSCULAR | Status: AC
  Administered 2021-05-12: 10 mg via INTRAMUSCULAR

## 2021-05-12 NOTE — Discharge Instructions (Signed)
You have received imitrex and a steroid together to help with your headache  Your COVID and Influenza tests are pending.  You should self quarantine until the test results are back.    Take Tylenol or ibuprofen as needed for fever or discomfort.  Rest and keep yourself hydrated.    Follow-up with your primary care provider if your symptoms are not improving.

## 2021-05-12 NOTE — ED Provider Notes (Signed)
[facility]    CSN: [id number] Arrival date & time: 05/12/21  1246      History   Chief Complaint Chief Complaint  Patient presents with  . Cough    HPI Victoria [place] is a 45 y.o. female.   Reports cough, headache, weakness, fatigue for the last 2 days. Was seen in this office yesterday and treated with IM toradol and Lomotil for migraine and viral gastroenteritis. Reports that she is not feeling any better. Denies sick contacts. Reports fever last night of 103. Has taken ibuprofen, home percocet with no relief from her headache. Reports that she is able to keep down fluids at this time. Has positive hx Covid. Has completed Covid vaccines. Has completed flu vaccines. Denies SOB, nausea, vomiting, abdominal pain, rash, other symptoms.   ROS per HPI  The history is provided by the patient.  Cough   Past Medical History:  Diagnosis Date  . Acute low back pain without sciatica 06/22/2018  . ADHD   . Allergy   . Anemia   . Anxiety   . Arthritis   . 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 Fibromyalgia, doubt sarcoid   . Asthma   . Blood transfusion without reported diagnosis   . Chest wall pain 05/26/2018  . Chronic kidney disease   . Chronic low back pain with sciatica 09/15/2018  . Common migraine with intractable migraine 10/07/2016  . Cough variant asthma vs UACS  10/23/2016   - Spirometry 04/15/2016  Very truncated exp loop effort dep portion only  - Allergy profile 10/22/2016 >  Eos 0.2/  IgE  52 RAST POS grass/trees/ragweed  10/22/2016  After extensive coaching HFA effectiveness =    75% try duelra 100 2bid > improved  . Daytime sleepiness 05/26/2018  . Decreased pedal pulses 10/04/2018  . Depression   . Depression, major, single episode, moderate ([place]) 05/26/2018  . Diabetes ([place])   . Dyspnea 04/15/2016   04/15/2016  Walked RA x 3 laps @ 185 ft each stopped due to  End of study, nl pace, no sob or desat    -  Spirometry 04/15/2016  Very truncated exp loop effort dep portion only  - 10/22/2016  Walked RA x 3 laps @ 185 ft each stopped due to  End of study, slow pace, min sob/ no desat - full pfts rec 10/22/2016 >>>      . Edema   . Essential hypertension, benign 11/15/2016  . Fibroids 03/11/2016  . Frequency of urination 09/11/2018  . GERD (gastroesophageal reflux disease)   . Herniated lumbar intervertebral disc   . Hiatal hernia   . Hilar adenopathy   . Hypertension   . Hypokalemia 06/28/2018  . Impaired fasting blood sugar 09/11/2018  . Iron deficiency   . Leg pain 12/07/2018  . Low libido 11/27/2018  . Migraine   . Mild sleep apnea 08/03/2018   HST 06/20/18  AHI  8.1 / snoring with 02 nadir 80% >  08/03/2018 rec sleep medicine consultation   . Morbid (severe) obesity due to excess calories ([place]) 10/23/2016  . Personal history of sarcoidosis 05/26/2018  . Prolonged capillary refill time   . Right leg swelling 08/18/2018  . Sarcoidosis    personal history of  . Seizures ([place])    history of   . Sleep apnea   . Spondylosis   . Vitamin D deficiency     Patient Active Problem List  Diagnosis Date Noted  . Asthmatic bronchitis 01/23/2021  . COVID-19 virus infection 01/23/2021  . Social anxiety disorder 10/03/2020  . Attention deficit hyperactivity disorder (ADHD), 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  . Mild episode of recurrent major depressive disorder ([place]) 10/03/2020  . Constipation 05/20/2020  . S/P gastric bypass 05/12/2020  . Intractable abdominal pain 04/27/2020  . Benign intracranial hypertension 02/14/2020  . Myopia of both eyes 02/14/2020  . 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 BMI 45.0-49.9, adult ([place]) 11/29/2019  . Other intervertebral disc degeneration, lumbar region 09/19/2019  . Complex cyst of left ovary 02/01/2019  . Sarcoidosis 02/01/2019  . Leg pain 12/07/2018  . Low libido 11/27/2018  . Prolonged capillary refill time 10/04/2018  . Decreased pedal pulses  10/04/2018  . Spondylosis 09/15/2018  . Chronic low back pain with sciatica 09/15/2018  . Impaired fasting blood sugar 09/11/2018  . Weight gain 09/11/2018  . Mild sleep apnea 08/03/2018  . Hypokalemia 06/28/2018  . Medication side effect 06/22/2018  . Acute low back pain without sciatica 06/22/2018  . Personal history of sarcoidosis 05/26/2018  . Vitamin D deficiency 05/26/2018  . Encounter for health maintenance examination with abnormal findings 05/26/2018  . Depression, major, single episode, moderate ([place]) 05/26/2018  . Daytime sleepiness 05/26/2018  . Chest pain of uncertain etiology 75/09/2584  . GERD (gastroesophageal reflux disease) 05/26/2018  . Essential hypertension, benign 11/15/2016  . 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 Morbid (severe) obesity due to excess calories ([place]) 10/23/2016  . Common migraine with intractable migraine 10/07/2016  . DOE (dyspnea on exertion) 04/15/2016  . Hilar adenopathy 04/15/2016  . Seizures ([place]) 03/11/2016  . Anemia 11/12/2014  . Iron deficiency 11/12/2014    Past Surgical History:  Procedure Laterality Date  . ABDOMINAL HYSTERECTOMY    . ESOPHAGEAL MANOMETRY N/A 09/05/2019   Procedure: ESOPHAGEAL MANOMETRY (EM);  Surgeon: Lavena Bullion, DO;  Location: [facility];  Service: Gastroenterology;  Laterality: N/A;  . LAPAROSCOPIC OVARIAN CYSTECTOMY Left 03/11/2016   Procedure: LAPAROSCOPIC OVARIAN CYSTECTOMY;  Surgeon: Eldred Manges, MD;  Location: [facility];  Service: Gynecology;  Laterality: Left;  . LAPAROSCOPIC VAGINAL HYSTERECTOMY WITH SALPINGECTOMY Bilateral 03/11/2016   Procedure: LAPAROSCOPIC ASSISTED VAGINAL HYSTERECTOMY WITH SALPINGECTOMY;  Surgeon: Eldred Manges, MD;  Location: [facility];  Service: Gynecology;  Laterality: Bilateral;  . [place] IMPEDANCE STUDY  09/05/2019   Procedure: [place] IMPEDANCE STUDY;  Surgeon: Lavena Bullion, DO;  Location: [facility];  Service: Gastroenterology;;  . TUBAL LIGATION    . UPPER  GASTROINTESTINAL ENDOSCOPY  10/11/2019   06/2019    OB History    Gravida  1   Para      Term      Preterm      AB      Living        SAB      IAB      Ectopic      Multiple      Live Births               Home Medications    Prior to Admission medications   Medication Sig Start Date End Date Taking? Authorizing Provider  benzonatate (TESSALON) 100 MG capsule Take 1 capsule (100 mg total) by mouth every 8 (eight) hours. 05/12/21  Yes Faustino Congress, NP  ACETAMINOPHEN EXTRA STRENGTH 500 MG tablet Take 1,000 mg by mouth every 6 (six) hours as needed for pain. 04/08/20   [provider]  albuterol (PROVENTIL) (2.5  MG/3ML) 0.083% nebulizer solution Take 2.5 mg by nebulization every 6 (six) hours as needed for wheezing or shortness of breath.    [provider]  albuterol (VENTOLIN HFA) 108 (90 Base) MCG/ACT inhaler Inhale 1-2 puffs into the lungs every 4 (four) hours as needed for wheezing or shortness of breath. 01/23/21   Parrett, Fonnie Mu, NP  ALPRAZolam (XANAX) 0.25 MG tablet Take 1 tablet (0.25 mg total) by mouth 2 (two) times daily as needed for anxiety. 10/03/20   Salley Slaughter, NP  amphetamine-dextroamphetamine (ADDERALL XR) 20 MG 24 hr capsule Take 1 capsule (20 mg total) by mouth as needed. 12/17/20   [place], Terese Door, PA  beclomethasone (QVAR) 80 MCG/ACT inhaler Inhale 2 puffs into the lungs 2 (two) times daily. 01/23/21   Parrett, Fonnie Mu, NP  brompheniramine-pseudoephedrine-DM 30-2-10 MG/5ML syrup Take 5 mLs by mouth 4 (four) times daily as needed. 05/07/21   Vanessa Kick, MD  dextromethorphan-guaiFENesin [facility] DM) 30-600 MG 12hr tablet Take 1 tablet by mouth 2 (two) times daily. 01/01/21   Hazel Sams, PA-C  diphenoxylate-atropine (LOMOTIL) 2.5-0.025 MG tablet Take 1-2 tablets by mouth 4 (four) times daily as needed for diarrhea or loose stools. 05/11/21   Scot Jun, FNP  famotidine (PEPCID) 20 MG tablet Take 1 tablet (20 mg  total) by mouth 2 (two) times daily. 02/10/21   Varney Biles, MD  ferrous sulfate (FER-IN-SOL) 75 (15 Fe) MG/ML SOLN Take 0.8 mLs (60 mg total) by mouth 2 (two) times daily. With food 07/22/20 08/21/20  Esterwood, Amy S, PA-C  GAVILAX 17 GM/SCOOP powder Take 17 g by mouth daily. 05/20/20   [provider]  HYDROcodone-acetaminophen (NORCO/VICODIN) 5-325 MG tablet Take 1 tablet by mouth every 4 (four) hours as needed for moderate pain. Patient not taking: Reported on 01/23/2021 08/16/20   Lavonia Drafts, MD  metFORMIN (GLUCOPHAGE) 500 MG tablet Take 500 mg by mouth 2 (two) times daily with a meal.    [provider]  metoCLOPramide (REGLAN) 10 MG tablet Take 1 tablet (10 mg total) by mouth 3 (three) times daily before meals. 02/12/21   Maudie Flakes, MD  Nirmatrelvir & Ritonavir 20 x 150 MG & 10 x 100MG  TBPK TAKE 3 TABLETS BY MOUTH 2 TIMES DAILY FOR 5 DAYS. 01/02/21 01/02/22  [place] Callas, NP  ondansetron (ZOFRAN) 4 MG tablet Take 4 mg by mouth every 8 (eight) hours as needed. 05/13/20   [provider]  oxyCODONE-acetaminophen (PERCOCET/ROXICET) 5-325 MG tablet Take 2 tablets by mouth every 12 (twelve) hours as needed for severe pain. 02/10/21   Varney Biles, MD  pantoprazole (PROTONIX) 40 MG tablet Take 1 tablet (40 mg total) by mouth 2 (two) times daily for 14 days. 09/15/20 09/29/20  Lavonia Drafts, MD  potassium chloride SA (KLOR-CON) 20 MEQ tablet Take 1 tablet (20 mEq total) by mouth 2 (two) times daily. 07/23/20   Esterwood, Amy S, PA-C  promethazine (PROMETHEGAN) 12.5 MG suppository Place 1 suppository (12.5 mg total) rectally every 6 (six) hours as needed for nausea or vomiting. 08/16/20   Lavonia Drafts, MD  sucralfate (CARAFATE) 1 g tablet Take 1 tablet (1 g total) by mouth 4 (four) times daily -  with meals and at bedtime. 02/10/21   Varney Biles, MD  SUMAtriptan (IMITREX) 100 MG tablet TAKE 1 TABLET BY MOUTH 2X DAILY AS NEEDED FOR MIGRAINE. MAY REPEAT IN 2 HOURS  IF HEADACHE PERSISTS OR RECURS. 12/11/19   Kathrynn Ducking, MD  traMADol (  ULTRAM) 50 MG tablet Take 50 mg by mouth every 6 (six) hours as needed for moderate pain.  03/27/20   [provider]  traZODone (DESYREL) 50 MG tablet Take 1 tablet (50 mg total) by mouth at bedtime as needed for sleep. 10/03/20   Salley Slaughter, NP  chlorthalidone (HYGROTON) 25 MG tablet TAKE 1 TABLET BY MOUTH EVERY DAY Patient taking differently: Take 25 mg by mouth daily.  11/05/19 01/01/21  Ngetich, Nelda Bucks, NP    Family History Family History  Adopted: Yes  Problem Relation Age of [place]  . Other Son        Growing pains  . Asthma Mother   . Heart Problems Mother   . Migraines Mother   . COPD Mother   . Heart attack Mother   . Diabetes Father   . Peptic Ulcer Father   . Heart Problems Father   . COPD Father   . Heart attack Father   . Colon cancer Neg Hx   . Colon polyps Neg Hx   . Esophageal cancer Neg Hx   . Rectal cancer Neg Hx   . Stomach cancer Neg Hx     Social History Social History   Tobacco Use  . Smoking status: Former Smoker    Packs/day: 0.25    Years: 17.00    Pack years: 4.25    Types: Cigarettes    Quit date: 03/02/2013    Years since quitting: 8.2  . Smokeless tobacco: Never Used  Vaping Use  . Vaping Use: Never used  Substance Use Topics  . Alcohol use: No    Alcohol/week: 0.0 standard drinks  . Drug use: Not Currently    Types: Marijuana    Comment: former - 6 yrs ago     Allergies   Aspirin   Review of Systems Review of Systems  Respiratory: Positive for cough.      Physical Exam Triage Vital Signs ED Triage Vitals  Enc Vitals Group     BP 05/12/21 1339 118/83     Pulse Rate 05/12/21 1339 71     Resp 05/12/21 1339 17     Temp 05/12/21 1339 98.6 F (37 C)     Temp Source 05/12/21 1339 Oral     SpO2 05/12/21 1339 97 %     Weight --      Height --      Head Circumference --      Peak Flow --      Pain Score 05/12/21 1338 10     Pain Loc  --      Pain Edu? --      Excl. in [place]? --    No data found.  Updated Vital Signs BP 118/83 (BP Location: Right Arm)   Pulse 71   Temp 98.6 F (37 C) (Oral)   Resp 17   LMP 02/19/2016   SpO2 97%   Visual Acuity Right Eye Distance:   Left Eye Distance:   Bilateral Distance:    Right Eye Near:   Left Eye Near:    Bilateral Near:     Physical Exam Vitals and nursing note reviewed.  Constitutional:      General: She is not in acute distress.    Appearance: She is well-developed and normal weight. She is ill-appearing.  HENT:     Head: Normocephalic and atraumatic.     Right Ear: Tympanic membrane, ear canal and external ear normal.     Left Ear: Tympanic  membrane, ear canal and external ear normal.     Nose: Nose normal.     Mouth/Throat:     Mouth: Mucous membranes are moist.     Pharynx: Oropharynx is clear.  Eyes:     Extraocular Movements: Extraocular movements intact.     Conjunctiva/sclera: Conjunctivae normal.     Pupils: Pupils are equal, round, and reactive to light.  Cardiovascular:     Rate and Rhythm: Normal rate and regular rhythm.     Heart sounds: Normal heart sounds. No murmur heard.   Pulmonary:     Effort: Pulmonary effort is normal. No respiratory distress.     Breath sounds: Normal breath sounds. No stridor. No wheezing, rhonchi or rales.  Chest:     Chest wall: No tenderness.  Abdominal:     General: There is no distension.     Palpations: Abdomen is soft. There is no mass.     Tenderness: There is no abdominal tenderness. There is no right CVA tenderness, left CVA tenderness, guarding or rebound.     Hernia: No hernia is present.  Musculoskeletal:        General: Normal range of motion.     Cervical back: Normal range of motion and neck supple.  Lymphadenopathy:     Cervical: No cervical adenopathy.  Skin:    General: Skin is warm and dry.     Capillary Refill: Capillary refill takes less than 2 seconds.  Neurological:     General: No  focal deficit present.     Mental Status: She is alert and oriented to person, place, and time.  Psychiatric:        Mood and Affect: Mood normal.        Behavior: Behavior normal.        Thought Content: Thought content normal.      UC Treatments / Results  Labs (all labs ordered are listed, but only abnormal results are displayed) Labs Reviewed  COVID-19, FLU A+B NAA    EKG   Radiology No results found.  Procedures Procedures (including critical care time)  Medications Ordered in UC Medications  SUMAtriptan (IMITREX) injection 6 mg (has no administration in time range)  dexamethasone (DECADRON) injection 10 mg (has no administration in time range)    Initial Impression / Assessment and Plan / UC Course  I have reviewed the triage vital signs and the nursing notes.  Pertinent labs & imaging results that were available during my care of the patient were reviewed by me and considered in my medical decision making (see chart for details).    Viral URI with cough Fever Migraine with status migrainosus Fatigue Body aches  Sumatriptan 6mg  SQ and Decadron 10mg  IM in office today Prescribed tessalon perles prn cough Work note provided Covid and flu swab obtained in office today.   Patient instructed to quarantine until results are back and negative.   If results are negative, patient may resume daily schedule as tolerated once they are fever free for 24 hours without the use of antipyretic medications.   If results are positive, patient instructed to quarantine for at least 5 days from symptom onset.  If after 5 days symptoms have resolved, may return to work with a well fitting mask for the next 5 days. If symptomatic after day 5, isolation should be extended to 10 days. Patient instructed to follow-up with primary care or with this office as needed.   Patient instructed to follow-up in the ER for trouble  swallowing, trouble breathing, other concerning  symptoms.    Final Clinical Impressions(s) / UC Diagnoses   Final diagnoses:  Viral URI with cough  Fever, unspecified fever cause  Other migraine with status migrainosus, not intractable  Other fatigue  Body aches     Discharge Instructions     You have received imitrex and a steroid together to help with your headache  Your COVID and Influenza tests are pending.  You should self quarantine until the test results are back.    Take Tylenol or ibuprofen as needed for fever or discomfort.  Rest and keep yourself hydrated.    Follow-up with your primary care provider if your symptoms are not improving.        ED Prescriptions    Medication Sig Dispense Auth. Provider   benzonatate (TESSALON) 100 MG capsule Take 1 capsule (100 mg total) by mouth every 8 (eight) hours. 21 capsule Faustino Congress, NP     PDMP not reviewed this encounter.   Faustino Congress, NP 05/12/21 1400

## 2021-05-12 NOTE — ED Triage Notes (Signed)
Cough, headache and weak.  Pt's job states she needs to come in to get a note for work.

## 2021-05-13 LAB — COVID-19, FLU A+B NAA
Influenza A, NAA: NOT DETECTED
Influenza B, NAA: NOT DETECTED
SARS-CoV-2, NAA: DETECTED — AB

## 2021-05-15 ENCOUNTER — Telehealth (HOSPITAL_COMMUNITY): Payer: Self-pay | Admitting: Emergency Medicine

## 2021-05-15 ENCOUNTER — Emergency Department (HOSPITAL_COMMUNITY)
Admission: EM | Admit: 2021-05-15 | Discharge: 2021-05-15 | Disposition: A | Discharge status: Home / Self Care | Payer: 59 | Attending: Emergency Medicine | Admitting: Emergency Medicine

## 2021-05-15 ENCOUNTER — Emergency Department (HOSPITAL_COMMUNITY): Payer: 59

## 2021-05-15 ENCOUNTER — Other Ambulatory Visit: Payer: Self-pay

## 2021-05-15 DIAGNOSIS — I129 Hypertensive chronic kidney disease with stage 1 through stage 4 chronic kidney disease, or unspecified chronic kidney disease: Secondary | ICD-10-CM | POA: Insufficient documentation

## 2021-05-15 DIAGNOSIS — Z7951 Long term (current) use of inhaled steroids: Secondary | ICD-10-CM | POA: Diagnosis not present

## 2021-05-15 DIAGNOSIS — Z7984 Long term (current) use of oral hypoglycemic drugs: Secondary | ICD-10-CM | POA: Insufficient documentation

## 2021-05-15 DIAGNOSIS — N189 Chronic kidney disease, unspecified: Secondary | ICD-10-CM | POA: Insufficient documentation

## 2021-05-15 DIAGNOSIS — Z8616 Personal history of COVID-19: Secondary | ICD-10-CM | POA: Diagnosis not present

## 2021-05-15 DIAGNOSIS — R55 Syncope and collapse: Secondary | ICD-10-CM | POA: Diagnosis present

## 2021-05-15 DIAGNOSIS — J45909 Unspecified asthma, uncomplicated: Secondary | ICD-10-CM | POA: Insufficient documentation

## 2021-05-15 DIAGNOSIS — Z87891 Personal history of nicotine dependence: Secondary | ICD-10-CM | POA: Insufficient documentation

## 2021-05-15 DIAGNOSIS — E119 Type 2 diabetes mellitus without complications: Secondary | ICD-10-CM | POA: Insufficient documentation

## 2021-05-15 LAB — CBC WITH DIFFERENTIAL/PLATELET
Abs Immature Granulocytes: 0.01 10*3/uL (ref 0.00–0.07)
Basophils Absolute: 0 10*3/uL (ref 0.0–0.1)
Basophils Relative: 1 %
Eosinophils Absolute: 0.1 10*3/uL (ref 0.0–0.5)
Eosinophils Relative: 2 %
HCT: 41.7 % (ref 36.0–46.0)
Hemoglobin: 13 g/dL (ref 12.0–15.0)
Immature Granulocytes: 0 %
Lymphocytes Relative: 47 %
Lymphs Abs: 2.5 10*3/uL (ref 0.7–4.0)
MCH: 29 pg (ref 26.0–34.0)
MCHC: 31.2 g/dL (ref 30.0–36.0)
MCV: 93.1 fL (ref 80.0–100.0)
Monocytes Absolute: 0.5 10*3/uL (ref 0.1–1.0)
Monocytes Relative: 9 %
Neutro Abs: 2.2 10*3/uL (ref 1.7–7.7)
Neutrophils Relative %: 41 %
Platelets: 178 10*3/uL (ref 150–400)
RBC: 4.48 MIL/uL (ref 3.87–5.11)
RDW: 11.9 % (ref 11.5–15.5)
WBC: 5.3 10*3/uL (ref 4.0–10.5)
nRBC: 0 % (ref 0.0–0.2)

## 2021-05-15 LAB — COMPREHENSIVE METABOLIC PANEL
ALT: 15 U/L (ref 0–44)
AST: 15 U/L (ref 15–41)
Albumin: 4 g/dL (ref 3.5–5.0)
Alkaline Phosphatase: 65 U/L (ref 38–126)
Anion gap: 10 (ref 5–15)
BUN: 13 mg/dL (ref 6–20)
CO2: 27 mmol/L (ref 22–32)
Calcium: 8.7 mg/dL — ABNORMAL LOW (ref 8.9–10.3)
Chloride: 103 mmol/L (ref 98–111)
Creatinine, Ser: 0.93 mg/dL (ref 0.44–1.00)
GFR, Estimated: >60 mL/min (ref >60)
Glucose, Bld: 90 mg/dL (ref 70–99)
Potassium: 3.6 mmol/L (ref 3.5–5.1)
Sodium: 140 mmol/L (ref 135–145)
Total Bilirubin: 0.5 mg/dL (ref 0.3–1.2)
Total Protein: 7.3 g/dL (ref 6.5–8.1)

## 2021-05-15 LAB — PHENYTOIN LEVEL, TOTAL: Phenytoin Lvl: <2.5 ug/mL — ABNORMAL LOW (ref 10.0–20.0)

## 2021-05-15 MED ORDER — SODIUM CHLORIDE 0.9 % IV SOLN
1000.0000 mg | Freq: Once | INTRAVENOUS | Status: AC
  Administered 2021-05-15: 1000 mg via INTRAVENOUS
  Filled 2021-05-15: qty 20

## 2021-05-15 MED ORDER — NIRMATRELVIR/RITONAVIR (PAXLOVID)TABLET: 1.0000 | ORAL_TABLET | Freq: Two times a day (BID) | ORAL | 0 refills | Status: DC

## 2021-05-15 MED ORDER — HYDROMORPHONE HCL 1 MG/ML IJ SOLN
0.5000 mg | Freq: Once | INTRAMUSCULAR | Status: AC
  Administered 2021-05-15: 0.5 mg via INTRAVENOUS
  Filled 2021-05-15: qty 1

## 2021-05-15 MED ORDER — PHENYTOIN SODIUM 50 MG/ML IJ SOLN
INTRAMUSCULAR | Status: AC
  Filled 2021-05-15: qty 20

## 2021-05-15 MED ORDER — BUTALBITAL-APAP-CAFFEINE 50-325-40 MG PO TABS
1.0000 | ORAL_TABLET | Freq: Once | ORAL | Status: AC
  Administered 2021-05-15: 1 via ORAL
  Filled 2021-05-15: qty 1

## 2021-05-15 MED ORDER — SODIUM CHLORIDE 0.9 % IV BOLUS
1000.0000 mL | Freq: Once | INTRAVENOUS | Status: AC
  Administered 2021-05-15: 1000 mL via INTRAVENOUS

## 2021-05-15 NOTE — ED Triage Notes (Signed)
EMS reports pt diagnosed with covid on 5/18.  Reports air conditioning out in her house for the past week.  Reports a repairman is coming to day to fix.  PT says she got hot and passed out so she got into a cold shower.  Pt says she also has history of seizures when she gets too hot and says she thinks she may have had a seizure.  Pt alert and oriented, c/o headache.  EMS reports cbg 106, vitals bp 129/96, hr 59, o2 sat 100%, temp 97.

## 2021-05-15 NOTE — Discharge Instructions (Addendum)
Stay hydrated and take your seizure medicine and follow-up with your doctor next week

## 2021-05-15 NOTE — ED Notes (Addendum)
Pt ambulated to bathroom with standby assist. No increased in dizziness while ambulating. Gait steady.   No c/o chest pain, SOB, lightheadedness or discomfort.

## 2021-05-15 NOTE — ED Provider Notes (Signed)
[facility] EMERGENCY DEPARTMENT Provider Note   CSN: [id number] Arrival date & time: 05/15/21  1501     History Chief Complaint  Patient presents with  . Loss of Consciousness    Victoria Moore is a 45 y.o. female.  Patient states that she got hot at home because her air conditioning was not working.  She was having some infected today.  Then she had a syncopal episode.  She is not sure if she had a seizure or not she states she can have a seizure if she gets hot.  Patient feels mildly weak now.  She has not been taking her Dilantin  The history is provided by the patient and medical records. No language interpreter was used.  Loss of Consciousness Episode history:  Single Most recent episode:  Today Timing:  Intermittent Progression:  Improving Chronicity:  Recurrent Context: not blood draw   Witnessed: no   Relieved by:  Nothing Worsened by:  Nothing Ineffective treatments:  None tried Associated symptoms: no anxiety, no chest pain, no headaches and no seizures        Past Medical History:  Diagnosis Date  . Acute low back pain without sciatica 06/22/2018  . ADHD   . Allergy   . Anemia   . Anxiety   . Arthritis   . 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 Fibromyalgia, doubt sarcoid   . Asthma   . Blood transfusion without reported diagnosis   . Chest wall pain 05/26/2018  . Chronic kidney disease   . Chronic low back pain with sciatica 09/15/2018  . Common migraine with intractable migraine 10/07/2016  . Cough variant asthma vs UACS  10/23/2016   - Spirometry 04/15/2016  Very truncated exp loop effort dep portion only  - Allergy profile 10/22/2016 >  Eos 0.2/  IgE  52 RAST POS grass/trees/ragweed  10/22/2016  After extensive coaching HFA effectiveness =    75% try duelra 100 2bid > improved  . Daytime sleepiness 05/26/2018  . Decreased pedal pulses 10/04/2018  . Depression   . Depression, major, single episode,  moderate ([place]) 05/26/2018  . Diabetes ([place])   . Dyspnea 04/15/2016   04/15/2016  Walked RA x 3 laps @ 185 ft each stopped due to  End of study, nl pace, no sob or desat    - Spirometry 04/15/2016  Very truncated exp loop effort dep portion only  - 10/22/2016  Walked RA x 3 laps @ 185 ft each stopped due to  End of study, slow pace, min sob/ no desat - full pfts rec 10/22/2016 >>>      . Edema   . Essential hypertension, benign 11/15/2016  . Fibroids 03/11/2016  . Frequency of urination 09/11/2018  . GERD (gastroesophageal reflux disease)   . Herniated lumbar intervertebral disc   . Hiatal hernia   . Hilar adenopathy   . Hypertension   . Hypokalemia 06/28/2018  . Impaired fasting blood sugar 09/11/2018  . Iron deficiency   . Leg pain 12/07/2018  . Low libido 11/27/2018  . Migraine   . Mild sleep apnea 08/03/2018   HST 06/20/18  AHI  8.1 / snoring with 02 nadir 80% >  08/03/2018 rec sleep medicine consultation   . Morbid (severe) obesity due to excess calories ([place]) 10/23/2016  . Personal history of sarcoidosis 05/26/2018  . Prolonged capillary refill time   . Right leg swelling 08/18/2018  . Sarcoidosis  personal history of  . Seizures ([place])    history of   . Sleep apnea   . Spondylosis   . Vitamin D deficiency     Patient Active Problem List   Diagnosis Date Noted  . Asthmatic bronchitis 01/23/2021  . COVID-19 virus infection 01/23/2021  . Social anxiety disorder 10/03/2020  . Attention deficit hyperactivity disorder (ADHD), 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  . Mild episode of recurrent major depressive disorder ([place]) 10/03/2020  . Constipation 05/20/2020  . S/P gastric bypass 05/12/2020  . Intractable abdominal pain 04/27/2020  . Benign intracranial hypertension 02/14/2020  . Myopia of both eyes 02/14/2020  . 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 BMI 45.0-49.9, adult ([place]) 11/29/2019  . Other intervertebral disc degeneration, lumbar region 09/19/2019  . Complex cyst of  left ovary 02/01/2019  . Sarcoidosis 02/01/2019  . Leg pain 12/07/2018  . Low libido 11/27/2018  . Prolonged capillary refill time 10/04/2018  . Decreased pedal pulses 10/04/2018  . Spondylosis 09/15/2018  . Chronic low back pain with sciatica 09/15/2018  . Impaired fasting blood sugar 09/11/2018  . Weight gain 09/11/2018  . Mild sleep apnea 08/03/2018  . Hypokalemia 06/28/2018  . Medication side effect 06/22/2018  . Acute low back pain without sciatica 06/22/2018  . Personal history of sarcoidosis 05/26/2018  . Vitamin D deficiency 05/26/2018  . Encounter for health maintenance examination with abnormal findings 05/26/2018  . Depression, major, single episode, moderate ([place]) 05/26/2018  . Daytime sleepiness 05/26/2018  . Chest pain of uncertain etiology [id number]  . GERD (gastroesophageal reflux disease) 05/26/2018  . Essential hypertension, benign 11/15/2016  . 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 Morbid (severe) obesity due to excess calories ([place]) 10/23/2016  . Common migraine with intractable migraine 10/07/2016  . DOE (dyspnea on exertion) 04/15/2016  . Hilar adenopathy 04/15/2016  . Seizures ([place]) 03/11/2016  . Anemia 11/12/2014  . Iron deficiency 11/12/2014    Past Surgical History:  Procedure Laterality Date  . ABDOMINAL HYSTERECTOMY    . ESOPHAGEAL MANOMETRY N/A 09/05/2019   Procedure: ESOPHAGEAL MANOMETRY (EM);  Surgeon: Lavena Bullion, DO;  Location: [facility];  Service: Gastroenterology;  Laterality: N/A;  . LAPAROSCOPIC OVARIAN CYSTECTOMY Left 03/11/2016   Procedure: LAPAROSCOPIC OVARIAN CYSTECTOMY;  Surgeon: Eldred Manges, MD;  Location: [facility];  Service: Gynecology;  Laterality: Left;  . LAPAROSCOPIC VAGINAL HYSTERECTOMY WITH SALPINGECTOMY Bilateral 03/11/2016   Procedure: LAPAROSCOPIC ASSISTED VAGINAL HYSTERECTOMY WITH SALPINGECTOMY;  Surgeon: Eldred Manges, MD;  Location: [facility];  Service: Gynecology;  Laterality: Bilateral;  . [place] IMPEDANCE  STUDY  09/05/2019   Procedure: [place] IMPEDANCE STUDY;  Surgeon: Lavena Bullion, DO;  Location: [facility];  Service: Gastroenterology;;  . TUBAL LIGATION    . UPPER GASTROINTESTINAL ENDOSCOPY  10/11/2019   06/2019     OB History    Gravida  1   Para      Term      Preterm      AB      Living        SAB      IAB      Ectopic      Multiple      Live Births              Family History  Adopted: Yes  Problem Relation Age of Onset  . Other Son        Growing pains  . Asthma Mother   . Heart Problems Mother   .  Migraines Mother   . COPD Mother   . Heart attack Mother   . Diabetes Father   . Peptic Ulcer Father   . Heart Problems Father   . COPD Father   . Heart attack Father   . Colon cancer Neg Hx   . Colon polyps Neg Hx   . Esophageal cancer Neg Hx   . Rectal cancer Neg Hx   . Stomach cancer Neg Hx     Social History   Tobacco Use  . Smoking status: Former Smoker    Packs/day: 0.25    Years: 17.00    Pack years: 4.25    Types: Cigarettes    Quit date: 03/02/2013    Years since quitting: 8.2  . Smokeless tobacco: Never Used  Vaping Use  . Vaping Use: Never used  Substance Use Topics  . Alcohol use: No    Alcohol/week: 0.0 standard drinks  . Drug use: Not Currently    Types: Marijuana    Comment: former - 6 yrs ago    Home Medications Prior to Admission medications   Medication Sig Start Date End Date Taking? Authorizing Provider  ACETAMINOPHEN EXTRA STRENGTH 500 MG tablet Take 1,000 mg by mouth every 6 (six) hours as needed for pain. 04/08/20   [provider]  albuterol (PROVENTIL) (2.5 MG/3ML) 0.083% nebulizer solution Take 2.5 mg by nebulization every 6 (six) hours as needed for wheezing or shortness of breath.    [provider]  albuterol (VENTOLIN HFA) 108 (90 Base) MCG/ACT inhaler Inhale 1-2 puffs into the lungs every 4 (four) hours as needed for wheezing or shortness of breath. 01/23/21   Parrett, Virgel Bouquet, NP   ALPRAZolam (XANAX) 0.25 MG tablet Take 1 tablet (0.25 mg total) by mouth 2 (two) times daily as needed for anxiety. 10/03/20   Shanna Cisco, NP  amphetamine-dextroamphetamine (ADDERALL XR) 20 MG 24 hr capsule Take 1 capsule (20 mg total) by mouth as needed. 12/17/20   Nwoko, Tommas Olp, PA  beclomethasone (QVAR) 80 MCG/ACT inhaler Inhale 2 puffs into the lungs 2 (two) times daily. 01/23/21   Parrett, Virgel Bouquet, NP  benzonatate (TESSALON) 100 MG capsule Take 1 capsule (100 mg total) by mouth every 8 (eight) hours. 05/12/21   Moshe Cipro, NP  brompheniramine-pseudoephedrine-DM 30-2-10 MG/5ML syrup Take 5 mLs by mouth 4 (four) times daily as needed. 05/07/21   Mardella Layman, MD  dextromethorphan-guaiFENesin [facility] DM) 30-600 MG 12hr tablet Take 1 tablet by mouth 2 (two) times daily. 01/01/21   Rhys Martini, PA-C  diphenoxylate-atropine (LOMOTIL) 2.5-0.025 MG tablet Take 1-2 tablets by mouth 4 (four) times daily as needed for diarrhea or loose stools. 05/11/21   Bing Neighbors, FNP  famotidine (PEPCID) 20 MG tablet Take 1 tablet (20 mg total) by mouth 2 (two) times daily. 02/10/21   Derwood Kaplan, MD  ferrous sulfate (FER-IN-SOL) 75 (15 Fe) MG/ML SOLN Take 0.8 mLs (60 mg total) by mouth 2 (two) times daily. With food 07/22/20 08/21/20  Esterwood, Amy S, PA-C  GAVILAX 17 GM/SCOOP powder Take 17 g by mouth daily. 05/20/20   [provider]  HYDROcodone-acetaminophen (NORCO/VICODIN) 5-325 MG tablet Take 1 tablet by mouth every 4 (four) hours as needed for moderate pain. Patient not taking: Reported on 01/23/2021 08/16/20   Jene Every, MD  metFORMIN (GLUCOPHAGE) 500 MG tablet Take 500 mg by mouth 2 (two) times daily with a meal.    [provider]  metoCLOPramide (REGLAN) 10 MG tablet Take  1 tablet (10 mg total) by mouth 3 (three) times daily before meals. 02/12/21   Maudie Flakes, MD  nirmatrelvir/ritonavir EUA (PAXLOVID) TABS Take 1 tablet by mouth 2 (two) times daily for 5  days. Patient GFR is 109. Take nirmatrelvir (150 mg) two tablets twice daily for 5 days and ritonavir (100 mg) one tablet twice daily for 5 days. 05/15/21 05/20/21  Chase Picket, MD  ondansetron (ZOFRAN) 4 MG tablet Take 4 mg by mouth every 8 (eight) hours as needed. 05/13/20   [provider]  oxyCODONE-acetaminophen (PERCOCET/ROXICET) 5-325 MG tablet Take 2 tablets by mouth every 12 (twelve) hours as needed for severe pain. 02/10/21   Varney Biles, MD  pantoprazole (PROTONIX) 40 MG tablet Take 1 tablet (40 mg total) by mouth 2 (two) times daily for 14 days. 09/15/20 09/29/20  Lavonia Drafts, MD  potassium chloride SA (KLOR-CON) 20 MEQ tablet Take 1 tablet (20 mEq total) by mouth 2 (two) times daily. 07/23/20   Esterwood, Amy S, PA-C  promethazine (PROMETHEGAN) 12.5 MG suppository Place 1 suppository (12.5 mg total) rectally every 6 (six) hours as needed for nausea or vomiting. 08/16/20   Lavonia Drafts, MD  sucralfate (CARAFATE) 1 g tablet Take 1 tablet (1 g total) by mouth 4 (four) times daily -  with meals and at bedtime. 02/10/21   Varney Biles, MD  SUMAtriptan (IMITREX) 100 MG tablet TAKE 1 TABLET BY MOUTH 2X DAILY AS NEEDED FOR MIGRAINE. MAY REPEAT IN 2 HOURS IF HEADACHE PERSISTS OR RECURS. 12/11/19   Kathrynn Ducking, MD  traMADol (ULTRAM) 50 MG tablet Take 50 mg by mouth every 6 (six) hours as needed for moderate pain.  03/27/20   [provider]  traZODone (DESYREL) 50 MG tablet Take 1 tablet (50 mg total) by mouth at bedtime as needed for sleep. 10/03/20   Salley Slaughter, NP  chlorthalidone (HYGROTON) 25 MG tablet TAKE 1 TABLET BY MOUTH EVERY DAY Patient taking differently: Take 25 mg by mouth daily.  11/05/19 01/01/21  Ngetich, Nelda Bucks, NP    Allergies    Aspirin  Review of Systems   Review of Systems  Constitutional: Positive for fatigue. Negative for appetite change.  HENT: Negative for congestion, ear discharge and sinus pressure.   Eyes: Negative for  discharge.  Respiratory: Negative for cough.   Cardiovascular: Positive for syncope. Negative for chest pain.  Gastrointestinal: Negative for abdominal pain and diarrhea.  Genitourinary: Negative for frequency and hematuria.  Musculoskeletal: Negative for back pain.  Skin: Negative for rash.  Neurological: Negative for seizures and headaches.  Psychiatric/Behavioral: Negative for hallucinations.    Physical Exam Updated Vital Signs BP (!) 153/92 (BP Location: Left Arm)   Pulse (!) 55   Temp 98.7 F (37.1 C) (Oral)   Resp 18   Ht 5\' 11"  (1.803 m)   Wt 87.5 kg   LMP 02/19/2016   SpO2 100%   BMI 26.92 kg/m   Physical Exam Vitals and nursing note reviewed.  Constitutional:      Appearance: She is well-developed.  HENT:     Head: Normocephalic.     Nose: Nose normal.  Eyes:     General: No scleral icterus.    Conjunctiva/sclera: Conjunctivae normal.  Neck:     Thyroid: No thyromegaly.  Cardiovascular:     Rate and Rhythm: Normal rate and regular rhythm.     Heart sounds: No murmur heard. No friction rub. No gallop.   Pulmonary:  Breath sounds: No stridor. No wheezing or rales.  Chest:     Chest wall: No tenderness.  Abdominal:     General: There is no distension.     Tenderness: There is no abdominal tenderness. There is no rebound.  Musculoskeletal:     Cervical back: Neck supple.     Comments: Tenderness to both hips minimal  Lymphadenopathy:     Cervical: No cervical adenopathy.  Skin:    Findings: No erythema or rash.  Neurological:     Mental Status: She is alert and oriented to person, place, and time.     Motor: No abnormal muscle tone.     Coordination: Coordination normal.  Psychiatric:        Behavior: Behavior normal.     ED Results / Procedures / Treatments   Labs (all labs ordered are listed, but only abnormal results are displayed) Labs Reviewed  COMPREHENSIVE METABOLIC PANEL - Abnormal; Notable for the following components:      Result  Value   Calcium 8.7 (*)    All other components within normal limits  PHENYTOIN LEVEL, TOTAL - Abnormal; Notable for the following components:   Phenytoin Lvl <2.5 (*)    All other components within normal limits  CBC WITH DIFFERENTIAL/PLATELET    EKG None  Radiology CT Head Wo Contrast  Result Date: 05/15/2021 CLINICAL DATA:  History of recent COVID diagnosis and recent syncopal episode EXAM: CT HEAD WITHOUT CONTRAST TECHNIQUE: Contiguous axial images were obtained from the base of the skull through the vertex without intravenous contrast. COMPARISON:  01/27/2020 FINDINGS: Brain: No evidence of acute infarction, hemorrhage, hydrocephalus, extra-axial collection or mass lesion/mass effect. Vascular: No hyperdense vessel or unexpected calcification. Skull: Normal. Negative for fracture or focal lesion. Sinuses/Orbits: No acute finding. Other: None. IMPRESSION: No acute intracranial abnormality noted. Electronically Signed   By: Inez Catalina M.D.   On: 05/15/2021 18:07    Procedures Procedures   Medications Ordered in ED Medications  phenytoin (DILANTIN) 1,000 mg in sodium chloride 0.9 % 250 mL IVPB (1,000 mg Intravenous New Bag/Given 05/15/21 2239)  sodium chloride 0.9 % bolus 1,000 mL (0 mLs Intravenous Stopping Infusion hung by another clincian 05/15/21 2117)  HYDROmorphone (DILAUDID) injection 0.5 mg (0.5 mg Intravenous Given 05/15/21 1726)  butalbital-acetaminophen-caffeine (FIORICET) 50-325-40 MG per tablet 1 tablet (1 tablet Oral Given 05/15/21 2133)    ED Course  I have reviewed the triage vital signs and the nursing notes.  Pertinent labs & imaging results that were available during my care of the patient were reviewed by me and considered in my medical decision making (see chart for details). CRITICAL CARE Performed by: Milton Ferguson Total critical care time: 35 minutes Critical care time was exclusive of separately billable procedures and treating other patients. Critical  care was necessary to treat or prevent imminent or life-threatening deterioration. Critical care was time spent personally by me on the following activities: development of treatment plan with patient and/or surrogate as well as nursing, discussions with consultants, evaluation of patient's response to treatment, examination of patient, obtaining history from patient or surrogate, ordering and performing treatments and interventions, ordering and review of laboratory studies, ordering and review of radiographic studies, pulse oximetry and re-evaluation of patient's condition.    MDM Rules/Calculators/A&P                         Patient was treated for episode possible seizure.  She has not been taking her  Dilantin so she was given a gram of Dilantin here.  Patient will follow up with her PCP Final Clinical Impression(s) / ED Diagnoses Final diagnoses:  None    Rx / DC Orders ED Discharge Orders    None       Milton Ferguson, MD 05/15/21 2304

## 2021-05-15 NOTE — ED Provider Notes (Signed)
[facility] EMERGENCY DEPARTMENT Provider Note   CSN: [id number] Arrival date & time: 05/15/21  1501     History Chief Complaint  Patient presents with  . Loss of Consciousness    Victoria Moore is a 45 y.o. female.  Patient states she had a fall and has a history of seizures she may have had a seizure.  She has not been taking her Dilantin.  Patient complains of headache  The history is provided by the patient and medical records. No language interpreter was used.  Loss of Consciousness Episode history:  Single Most recent episode:  Today Timing:  Rare Progression:  Resolved Chronicity:  New Context: not blood draw   Relieved by:  Nothing Worsened by:  Nothing Ineffective treatments:  None tried Associated symptoms: headaches   Associated symptoms: no anxiety, no chest pain and no seizures        Past Medical History:  Diagnosis Date  . Acute low back pain without sciatica 06/22/2018  . ADHD   . Allergy   . Anemia   . Anxiety   . Arthritis   . 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 Fibromyalgia, doubt sarcoid   . Asthma   . Blood transfusion without reported diagnosis   . Chest wall pain 05/26/2018  . Chronic kidney disease   . Chronic low back pain with sciatica 09/15/2018  . Common migraine with intractable migraine 10/07/2016  . Cough variant asthma vs UACS  10/23/2016   - Spirometry 04/15/2016  Very truncated exp loop effort dep portion only  - Allergy profile 10/22/2016 >  Eos 0.2/  IgE  52 RAST POS grass/trees/ragweed  10/22/2016  After extensive coaching HFA effectiveness =    75% try duelra 100 2bid > improved  . Daytime sleepiness 05/26/2018  . Decreased pedal pulses 10/04/2018  . Depression   . Depression, major, single episode, moderate ([place]) 05/26/2018  . Diabetes ([place])   . Dyspnea 04/15/2016   04/15/2016  Walked RA x 3 laps @ 185 ft each stopped due to  End of study, nl pace, no sob or desat    -  Spirometry 04/15/2016  Very truncated exp loop effort dep portion only  - 10/22/2016  Walked RA x 3 laps @ 185 ft each stopped due to  End of study, slow pace, min sob/ no desat - full pfts rec 10/22/2016 >>>      . Edema   . Essential hypertension, benign 11/15/2016  . Fibroids 03/11/2016  . Frequency of urination 09/11/2018  . GERD (gastroesophageal reflux disease)   . Herniated lumbar intervertebral disc   . Hiatal hernia   . Hilar adenopathy   . Hypertension   . Hypokalemia 06/28/2018  . Impaired fasting blood sugar 09/11/2018  . Iron deficiency   . Leg pain 12/07/2018  . Low libido 11/27/2018  . Migraine   . Mild sleep apnea 08/03/2018   HST 06/20/18  AHI  8.1 / snoring with 02 nadir 80% >  08/03/2018 rec sleep medicine consultation   . Morbid (severe) obesity due to excess calories ([place]) 10/23/2016  . Personal history of sarcoidosis 05/26/2018  . Prolonged capillary refill time   . Right leg swelling 08/18/2018  . Sarcoidosis    personal history of  . Seizures ([place])    history of   . Sleep apnea   . Spondylosis   . Vitamin D deficiency     Patient Active Problem  List   Diagnosis Date Noted  . Asthmatic bronchitis 01/23/2021  . COVID-19 virus infection 01/23/2021  . Social anxiety disorder 10/03/2020  . Attention deficit hyperactivity disorder (ADHD), 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  . Mild episode of recurrent major depressive disorder ([place]) 10/03/2020  . Constipation 05/20/2020  . S/P gastric bypass 05/12/2020  . Intractable abdominal pain 04/27/2020  . Benign intracranial hypertension 02/14/2020  . Myopia of both eyes 02/14/2020  . 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 BMI 45.0-49.9, adult ([place]) 11/29/2019  . Other intervertebral disc degeneration, lumbar region 09/19/2019  . Complex cyst of left ovary 02/01/2019  . Sarcoidosis 02/01/2019  . Leg pain 12/07/2018  . Low libido 11/27/2018  . Prolonged capillary refill time 10/04/2018  . Decreased pedal pulses  10/04/2018  . Spondylosis 09/15/2018  . Chronic low back pain with sciatica 09/15/2018  . Impaired fasting blood sugar 09/11/2018  . Weight gain 09/11/2018  . Mild sleep apnea 08/03/2018  . Hypokalemia 06/28/2018  . Medication side effect 06/22/2018  . Acute low back pain without sciatica 06/22/2018  . Personal history of sarcoidosis 05/26/2018  . Vitamin D deficiency 05/26/2018  . Encounter for health maintenance examination with abnormal findings 05/26/2018  . Depression, major, single episode, moderate ([place]) 05/26/2018  . Daytime sleepiness 05/26/2018  . Chest pain of uncertain etiology [id number]  . GERD (gastroesophageal reflux disease) 05/26/2018  . Essential hypertension, benign 11/15/2016  . 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 Morbid (severe) obesity due to excess calories ([place]) 10/23/2016  . Common migraine with intractable migraine 10/07/2016  . DOE (dyspnea on exertion) 04/15/2016  . Hilar adenopathy 04/15/2016  . Seizures ([place]) 03/11/2016  . Anemia 11/12/2014  . Iron deficiency 11/12/2014    Past Surgical History:  Procedure Laterality Date  . ABDOMINAL HYSTERECTOMY    . ESOPHAGEAL MANOMETRY N/A 09/05/2019   Procedure: ESOPHAGEAL MANOMETRY (EM);  Surgeon: Lavena Bullion, DO;  Location: [facility];  Service: Gastroenterology;  Laterality: N/A;  . LAPAROSCOPIC OVARIAN CYSTECTOMY Left 03/11/2016   Procedure: LAPAROSCOPIC OVARIAN CYSTECTOMY;  Surgeon: Eldred Manges, MD;  Location: [facility];  Service: Gynecology;  Laterality: Left;  . LAPAROSCOPIC VAGINAL HYSTERECTOMY WITH SALPINGECTOMY Bilateral 03/11/2016   Procedure: LAPAROSCOPIC ASSISTED VAGINAL HYSTERECTOMY WITH SALPINGECTOMY;  Surgeon: Eldred Manges, MD;  Location: [facility];  Service: Gynecology;  Laterality: Bilateral;  . [place] IMPEDANCE STUDY  09/05/2019   Procedure: [place] IMPEDANCE STUDY;  Surgeon: Lavena Bullion, DO;  Location: [facility];  Service: Gastroenterology;;  . TUBAL LIGATION    . UPPER  GASTROINTESTINAL ENDOSCOPY  10/11/2019   06/2019     OB History    Gravida  1   Para      Term      Preterm      AB      Living        SAB      IAB      Ectopic      Multiple      Live Births              Family History  Adopted: Yes  Problem Relation Age of Onset  . Other Son        Growing pains  . Asthma Mother   . Heart Problems Mother   . Migraines Mother   . COPD Mother   . Heart attack Mother   . Diabetes Father   . Peptic Ulcer Father   . Heart Problems Father   . COPD  Father   . Heart attack Father   . Colon cancer Neg Hx   . Colon polyps Neg Hx   . Esophageal cancer Neg Hx   . Rectal cancer Neg Hx   . Stomach cancer Neg Hx     Social History   Tobacco Use  . Smoking status: Former Smoker    Packs/day: 0.25    Years: 17.00    Pack years: 4.25    Types: Cigarettes    Quit date: 03/02/2013    Years since quitting: 8.2  . Smokeless tobacco: Never Used  Vaping Use  . Vaping Use: Never used  Substance Use Topics  . Alcohol use: No    Alcohol/week: 0.0 standard drinks  . Drug use: Not Currently    Types: Marijuana    Comment: former - 6 yrs ago    Home Medications Prior to Admission medications   Medication Sig Start Date End Date Taking? Authorizing Provider  ACETAMINOPHEN EXTRA STRENGTH 500 MG tablet Take 1,000 mg by mouth every 6 (six) hours as needed for pain. 04/08/20   [provider]  albuterol (PROVENTIL) (2.5 MG/3ML) 0.083% nebulizer solution Take 2.5 mg by nebulization every 6 (six) hours as needed for wheezing or shortness of breath.    [provider]  albuterol (VENTOLIN HFA) 108 (90 Base) MCG/ACT inhaler Inhale 1-2 puffs into the lungs every 4 (four) hours as needed for wheezing or shortness of breath. 01/23/21   Parrett, Fonnie Mu, NP  ALPRAZolam (XANAX) 0.25 MG tablet Take 1 tablet (0.25 mg total) by mouth 2 (two) times daily as needed for anxiety. 10/03/20   Salley Slaughter, NP   amphetamine-dextroamphetamine (ADDERALL XR) 20 MG 24 hr capsule Take 1 capsule (20 mg total) by mouth as needed. 12/17/20   Nwoko, Terese Door, PA  beclomethasone (QVAR) 80 MCG/ACT inhaler Inhale 2 puffs into the lungs 2 (two) times daily. 01/23/21   Parrett, Fonnie Mu, NP  benzonatate (TESSALON) 100 MG capsule Take 1 capsule (100 mg total) by mouth every 8 (eight) hours. 05/12/21   Faustino Congress, NP  brompheniramine-pseudoephedrine-DM 30-2-10 MG/5ML syrup Take 5 mLs by mouth 4 (four) times daily as needed. 05/07/21   Vanessa Kick, MD  dextromethorphan-guaiFENesin [facility] DM) 30-600 MG 12hr tablet Take 1 tablet by mouth 2 (two) times daily. 01/01/21   Hazel Sams, PA-C  diphenoxylate-atropine (LOMOTIL) 2.5-0.025 MG tablet Take 1-2 tablets by mouth 4 (four) times daily as needed for diarrhea or loose stools. 05/11/21   Scot Jun, FNP  famotidine (PEPCID) 20 MG tablet Take 1 tablet (20 mg total) by mouth 2 (two) times daily. 02/10/21   Varney Biles, MD  ferrous sulfate (FER-IN-SOL) 75 (15 Fe) MG/ML SOLN Take 0.8 mLs (60 mg total) by mouth 2 (two) times daily. With food 07/22/20 08/21/20  Esterwood, Amy S, PA-C  GAVILAX 17 GM/SCOOP powder Take 17 g by mouth daily. 05/20/20   [provider]  HYDROcodone-acetaminophen (NORCO/VICODIN) 5-325 MG tablet Take 1 tablet by mouth every 4 (four) hours as needed for moderate pain. Patient not taking: Reported on 01/23/2021 08/16/20   Lavonia Drafts, MD  metFORMIN (GLUCOPHAGE) 500 MG tablet Take 500 mg by mouth 2 (two) times daily with a meal.    [provider]  metoCLOPramide (REGLAN) 10 MG tablet Take 1 tablet (10 mg total) by mouth 3 (three) times daily before meals. 02/12/21   Maudie Flakes, MD  nirmatrelvir/ritonavir EUA (PAXLOVID) TABS Take 1 tablet by mouth 2 (two) times daily  for 5 days. Patient GFR is 109. Take nirmatrelvir (150 mg) two tablets twice daily for 5 days and ritonavir (100 mg) one tablet twice daily for 5 days.  05/15/21 05/20/21  Chase Picket, MD  ondansetron (ZOFRAN) 4 MG tablet Take 4 mg by mouth every 8 (eight) hours as needed. 05/13/20   [provider]  oxyCODONE-acetaminophen (PERCOCET/ROXICET) 5-325 MG tablet Take 2 tablets by mouth every 12 (twelve) hours as needed for severe pain. 02/10/21   Varney Biles, MD  pantoprazole (PROTONIX) 40 MG tablet Take 1 tablet (40 mg total) by mouth 2 (two) times daily for 14 days. 09/15/20 09/29/20  Lavonia Drafts, MD  potassium chloride SA (KLOR-CON) 20 MEQ tablet Take 1 tablet (20 mEq total) by mouth 2 (two) times daily. 07/23/20   Esterwood, Amy S, PA-C  promethazine (PROMETHEGAN) 12.5 MG suppository Place 1 suppository (12.5 mg total) rectally every 6 (six) hours as needed for nausea or vomiting. 08/16/20   Lavonia Drafts, MD  sucralfate (CARAFATE) 1 g tablet Take 1 tablet (1 g total) by mouth 4 (four) times daily -  with meals and at bedtime. 02/10/21   Varney Biles, MD  SUMAtriptan (IMITREX) 100 MG tablet TAKE 1 TABLET BY MOUTH 2X DAILY AS NEEDED FOR MIGRAINE. MAY REPEAT IN 2 HOURS IF HEADACHE PERSISTS OR RECURS. 12/11/19   Kathrynn Ducking, MD  traMADol (ULTRAM) 50 MG tablet Take 50 mg by mouth every 6 (six) hours as needed for moderate pain.  03/27/20   [provider]  traZODone (DESYREL) 50 MG tablet Take 1 tablet (50 mg total) by mouth at bedtime as needed for sleep. 10/03/20   Salley Slaughter, NP  chlorthalidone (HYGROTON) 25 MG tablet TAKE 1 TABLET BY MOUTH EVERY DAY Patient taking differently: Take 25 mg by mouth daily.  11/05/19 01/01/21  Ngetich, Dinah C, NP    Allergies    Aspirin  Review of Systems   Review of Systems  Constitutional: Negative for appetite change and fatigue.  HENT: Negative for congestion, ear discharge and sinus pressure.   Eyes: Negative for discharge.  Respiratory: Negative for cough.   Cardiovascular: Positive for syncope. Negative for chest pain.  Gastrointestinal: Negative for abdominal pain and  diarrhea.  Genitourinary: Negative for frequency and hematuria.  Musculoskeletal: Negative for back pain.  Skin: Negative for rash.  Neurological: Positive for headaches. Negative for seizures.  Psychiatric/Behavioral: Negative for hallucinations.    Physical Exam Updated Vital Signs BP (!) 153/92 (BP Location: Left Arm)   Pulse (!) 55   Temp 98.7 F (37.1 C) (Oral)   Resp 18   Ht 5\' 11"  (1.803 m)   Wt 87.5 kg   LMP 02/19/2016   SpO2 100%   BMI 26.92 kg/m   Physical Exam Vitals and nursing note reviewed.  Constitutional:      Appearance: She is well-developed.  HENT:     Head: Normocephalic.  Eyes:     General: No scleral icterus.    Conjunctiva/sclera: Conjunctivae normal.  Neck:     Thyroid: No thyromegaly.  Cardiovascular:     Rate and Rhythm: Normal rate and regular rhythm.     Heart sounds: No murmur heard. No friction rub. No gallop.   Pulmonary:     Breath sounds: No stridor. No wheezing or rales.  Chest:     Chest wall: No tenderness.  Abdominal:     General: There is no distension.     Tenderness: There is no abdominal tenderness. There is  no rebound.  Musculoskeletal:        General: Normal range of motion.     Cervical back: Neck supple.  Lymphadenopathy:     Cervical: No cervical adenopathy.  Skin:    Findings: No erythema or rash.  Neurological:     Mental Status: She is alert and oriented to person, place, and time.     Motor: No abnormal muscle tone.     Coordination: Coordination normal.  Psychiatric:        Behavior: Behavior normal.     ED Results / Procedures / Treatments   Labs (all labs ordered are listed, but only abnormal results are displayed) Labs Reviewed  COMPREHENSIVE METABOLIC PANEL - Abnormal; Notable for the following components:      Result Value   Calcium 8.7 (*)    All other components within normal limits  PHENYTOIN LEVEL, TOTAL - Abnormal; Notable for the following components:   Phenytoin Lvl <2.5 (*)    All  other components within normal limits  CBC WITH DIFFERENTIAL/PLATELET    EKG None  Radiology CT Head Wo Contrast  Result Date: 05/15/2021 CLINICAL DATA:  History of recent COVID diagnosis and recent syncopal episode EXAM: CT HEAD WITHOUT CONTRAST TECHNIQUE: Contiguous axial images were obtained from the base of the skull through the vertex without intravenous contrast. COMPARISON:  01/27/2020 FINDINGS: Brain: No evidence of acute infarction, hemorrhage, hydrocephalus, extra-axial collection or mass lesion/mass effect. Vascular: No hyperdense vessel or unexpected calcification. Skull: Normal. Negative for fracture or focal lesion. Sinuses/Orbits: No acute finding. Other: None. IMPRESSION: No acute intracranial abnormality noted. Electronically Signed   By: Inez Catalina M.D.   On: 05/15/2021 18:07    Procedures Procedures   Medications Ordered in ED Medications  phenytoin (DILANTIN) 1,000 mg in sodium chloride 0.9 % 250 mL IVPB (1,000 mg Intravenous New Bag/Given 05/15/21 2239)  sodium chloride 0.9 % bolus 1,000 mL (0 mLs Intravenous Stopping Infusion hung by another clincian 05/15/21 2117)  HYDROmorphone (DILAUDID) injection 0.5 mg (0.5 mg Intravenous Given 05/15/21 1726)  butalbital-acetaminophen-caffeine (FIORICET) 50-325-40 MG per tablet 1 tablet (1 tablet Oral Given 05/15/21 2133)    ED Course  I have reviewed the triage vital signs and the nursing notes.  Pertinent labs & imaging results that were available during my care of the patient were reviewed by me and considered in my medical decision making (see chart for details).    MDM Rules/Calculators/A&P                          Patient with syncopal episode and possible seizure.  She has not been taking her Dilantin so she was loaded with Dilantin and told to take her Dilantin x-rays and labs unremarkable Final Clinical Impression(s) / ED Diagnoses Final diagnoses:  None    Rx / DC Orders ED Discharge Orders    None        Milton Ferguson, MD 05/17/21 1142

## 2021-05-17 ENCOUNTER — Telehealth (HOSPITAL_COMMUNITY): Payer: Self-pay | Admitting: Emergency Medicine

## 2021-05-17 MED ORDER — NIRMATRELVIR/RITONAVIR (PAXLOVID)TABLET: 3.0000 | ORAL_TABLET | Freq: Two times a day (BID) | ORAL | 0 refills | Status: DC

## 2021-05-17 NOTE — Telephone Encounter (Signed)
COVID positive with PO meds called to pharmacy but pharmacy did not receive. Will re send.

## 2021-05-18 ENCOUNTER — Other Ambulatory Visit: Payer: Self-pay

## 2021-05-18 ENCOUNTER — Emergency Department (HOSPITAL_COMMUNITY): Payer: 59

## 2021-05-18 ENCOUNTER — Encounter (HOSPITAL_COMMUNITY): Payer: Self-pay

## 2021-05-18 ENCOUNTER — Emergency Department (HOSPITAL_COMMUNITY)
Admission: EM | Admit: 2021-05-18 | Discharge: 2021-05-18 | Disposition: A | Discharge status: Home / Self Care | Payer: 59 | Attending: Emergency Medicine | Admitting: Emergency Medicine

## 2021-05-18 ENCOUNTER — Ambulatory Visit
Admission: RE | Admit: 2021-05-18 | Discharge: 2021-05-18 | Disposition: A | Discharge status: Home / Self Care | Payer: 59 | Source: Ambulatory Visit | Attending: Internal Medicine | Admitting: Internal Medicine

## 2021-05-18 DIAGNOSIS — Z8616 Personal history of COVID-19: Secondary | ICD-10-CM | POA: Insufficient documentation

## 2021-05-18 DIAGNOSIS — Z87891 Personal history of nicotine dependence: Secondary | ICD-10-CM | POA: Diagnosis not present

## 2021-05-18 DIAGNOSIS — I10 Essential (primary) hypertension: Secondary | ICD-10-CM | POA: Diagnosis not present

## 2021-05-18 DIAGNOSIS — U071 COVID-19: Secondary | ICD-10-CM | POA: Diagnosis not present

## 2021-05-18 DIAGNOSIS — Z7951 Long term (current) use of inhaled steroids: Secondary | ICD-10-CM | POA: Diagnosis not present

## 2021-05-18 DIAGNOSIS — J45909 Unspecified asthma, uncomplicated: Secondary | ICD-10-CM | POA: Diagnosis not present

## 2021-05-18 DIAGNOSIS — R42 Dizziness and giddiness: Secondary | ICD-10-CM | POA: Diagnosis present

## 2021-05-18 LAB — COMPREHENSIVE METABOLIC PANEL
ALT: 16 U/L (ref 0–44)
AST: 15 U/L (ref 15–41)
Albumin: 4.3 g/dL (ref 3.5–5.0)
Alkaline Phosphatase: 76 U/L (ref 38–126)
Anion gap: 7 (ref 5–15)
BUN: 9 mg/dL (ref 6–20)
CO2: 30 mmol/L (ref 22–32)
Calcium: 9 mg/dL (ref 8.9–10.3)
Chloride: 102 mmol/L (ref 98–111)
Creatinine, Ser: 0.89 mg/dL (ref 0.44–1.00)
GFR, Estimated: >60 mL/min (ref >60)
Glucose, Bld: 91 mg/dL (ref 70–99)
Potassium: 3.5 mmol/L (ref 3.5–5.1)
Sodium: 139 mmol/L (ref 135–145)
Total Bilirubin: 0.4 mg/dL (ref 0.3–1.2)
Total Protein: 7.8 g/dL (ref 6.5–8.1)

## 2021-05-18 LAB — CBC WITH DIFFERENTIAL/PLATELET
Abs Immature Granulocytes: 0.01 10*3/uL (ref 0.00–0.07)
Basophils Absolute: 0 10*3/uL (ref 0.0–0.1)
Basophils Relative: 1 %
Eosinophils Absolute: 0.1 10*3/uL (ref 0.0–0.5)
Eosinophils Relative: 3 %
HCT: 44.4 % (ref 36.0–46.0)
Hemoglobin: 14.1 g/dL (ref 12.0–15.0)
Immature Granulocytes: 0 %
Lymphocytes Relative: 45 %
Lymphs Abs: 1.9 10*3/uL (ref 0.7–4.0)
MCH: 29.2 pg (ref 26.0–34.0)
MCHC: 31.8 g/dL (ref 30.0–36.0)
MCV: 91.9 fL (ref 80.0–100.0)
Monocytes Absolute: 0.3 10*3/uL (ref 0.1–1.0)
Monocytes Relative: 6 %
Neutro Abs: 1.8 10*3/uL (ref 1.7–7.7)
Neutrophils Relative %: 45 %
Platelets: 249 10*3/uL (ref 150–400)
RBC: 4.83 MIL/uL (ref 3.87–5.11)
RDW: 12.1 % (ref 11.5–15.5)
WBC: 4.1 10*3/uL (ref 4.0–10.5)
nRBC: 0 % (ref 0.0–0.2)

## 2021-05-18 LAB — TROPONIN I (HIGH SENSITIVITY): Troponin I (High Sensitivity): <2 ng/L (ref <18)

## 2021-05-18 MED ORDER — NIRMATRELVIR/RITONAVIR (PAXLOVID)TABLET: 3.0000 | ORAL_TABLET | Freq: Two times a day (BID) | ORAL | 0 refills | Status: AC

## 2021-05-18 MED ORDER — NIRMATRELVIR/RITONAVIR (PAXLOVID)TABLET: 3.0000 | ORAL_TABLET | Freq: Two times a day (BID) | ORAL | 0 refills | Status: DC

## 2021-05-18 NOTE — ED Triage Notes (Signed)
Pt positive for covid on 5/17, pt  Presents with chest pain that began yesterday with sob and body aches

## 2021-05-18 NOTE — Discharge Instructions (Addendum)
Please take this printed prescription to the pharmacy.  Take medications, as directed.  Please follow-up with your primary care provider regarding today's ED encounter for ongoing evaluation and management.  Please continue to maintain isolation precautions.  Return to the ED or seek immediate medical attention should you experience any new or worsening symptoms.

## 2021-05-18 NOTE — ED Provider Notes (Signed)
[facility] Provider Note   CSN: [id number] Arrival date & time: 05/18/21  0945     History Chief Complaint  Patient presents with  . Covid Positive    Victoria Moore is a 45 y.o. female with PMH significant for seizure disorder recently prescribed Paxlovid for COVID-19 who returns to the ED for dizziness and worsening symptoms in context of her viral infection.  I reviewed her medical record and she has been seen in the ED or by an urgent care 4 times in the past 2 weeks.  She was tested for COVID-19 and influenza on 05/12/2021 and discharged home with sumatriptan for headaches after being treated with Decadron 10 mg IM.  The COVID-19 test obtained 6 days ago was positive.  On my examination, patient reports that she was prescribed Paxlovid, but when she went to pick it up at the pharmacy they said it was not available/had not been prescribed.  This was confusing to her because it was also shown in her MyChart that it had been prescribed.  She states that her symptoms have been worsening and that she has been having near constant lightheadedness, worse with moving around.  She denies any room spinning dizziness or blurred vision.  She also states that she has been having ongoing headache symptoms, sore throat, cough, and generalized body aches.  She is endorsing chest pain and shortness of breath on my exam, although states that it is episodic and worse with coughing.  She also states that she has been having mild intermittent nausea, but no emesis.  Her abdominal pain is intermittent, also worse with coughing.  She denies any urinary symptoms, melena or hematochezia, other changes in her bowel habits, emesis, history of clots or clotting disorder, unilateral extremity swelling or edema, hemoptysis, blurred vision or diplopia, numbness or weakness, or other focal deficits.  Patient reports that this is her second time having COVID-19 and she had been fully immunized.  She  states that if she simply had the outpatient management she does not think that she would have to come to the ED.  HPI     Past Medical History:  Diagnosis Date  . Acute low back pain without sciatica 06/22/2018  . ADHD   . Allergy   . Anemia   . Anxiety   . Arthritis   . 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 Fibromyalgia, doubt sarcoid   . Asthma   . Blood transfusion without reported diagnosis   . Chest wall pain 05/26/2018  . Chronic kidney disease   . Chronic low back pain with sciatica 09/15/2018  . Common migraine with intractable migraine 10/07/2016  . Cough variant asthma vs UACS  10/23/2016   - Spirometry 04/15/2016  Very truncated exp loop effort dep portion only  - Allergy profile 10/22/2016 >  Eos 0.2/  IgE  52 RAST POS grass/trees/ragweed  10/22/2016  After extensive coaching HFA effectiveness =    75% try duelra 100 2bid > improved  . Daytime sleepiness 05/26/2018  . Decreased pedal pulses 10/04/2018  . Depression   . Depression, major, single episode, moderate ([place]) 05/26/2018  . Diabetes ([place])   . Dyspnea 04/15/2016   04/15/2016  Walked RA x 3 laps @ 185 ft each stopped due to  End of study, nl pace, no sob or desat    - Spirometry 04/15/2016  Very truncated exp loop effort dep portion only  -  10/22/2016  Walked RA x 3 laps @ 185 ft each stopped due to  End of study, slow pace, min sob/ no desat - full pfts rec 10/22/2016 >>>      . Edema   . Essential hypertension, benign 11/15/2016  . Fibroids 03/11/2016  . Frequency of urination 09/11/2018  . GERD (gastroesophageal reflux disease)   . Herniated lumbar intervertebral disc   . Hiatal hernia   . Hilar adenopathy   . Hypertension   . Hypokalemia 06/28/2018  . Impaired fasting blood sugar 09/11/2018  . Iron deficiency   . Leg pain 12/07/2018  . Low libido 11/27/2018  . Migraine   . Mild sleep apnea 08/03/2018   HST 06/20/18  AHI  8.1 / snoring with 02 nadir 80% >  08/03/2018  rec sleep medicine consultation   . Morbid (severe) obesity due to excess calories ([place]) 10/23/2016  . Personal history of sarcoidosis 05/26/2018  . Prolonged capillary refill time   . Right leg swelling 08/18/2018  . Sarcoidosis    personal history of  . Seizures ([place])    history of   . Sleep apnea   . Spondylosis   . Vitamin D deficiency     Patient Active Problem List   Diagnosis Date Noted  . Asthmatic bronchitis 01/23/2021  . COVID-19 virus infection 01/23/2021  . Social anxiety disorder 10/03/2020  . Attention deficit hyperactivity disorder (ADHD), 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  . Mild episode of recurrent major depressive disorder ([place]) 10/03/2020  . Constipation 05/20/2020  . S/P gastric bypass 05/12/2020  . Intractable abdominal pain 04/27/2020  . Benign intracranial hypertension 02/14/2020  . Myopia of both eyes 02/14/2020  . 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 BMI 45.0-49.9, adult ([place]) 11/29/2019  . Other intervertebral disc degeneration, lumbar region 09/19/2019  . Complex cyst of left ovary 02/01/2019  . Sarcoidosis 02/01/2019  . Leg pain 12/07/2018  . Low libido 11/27/2018  . Prolonged capillary refill time 10/04/2018  . Decreased pedal pulses 10/04/2018  . Spondylosis 09/15/2018  . Chronic low back pain with sciatica 09/15/2018  . Impaired fasting blood sugar 09/11/2018  . Weight gain 09/11/2018  . Mild sleep apnea 08/03/2018  . Hypokalemia 06/28/2018  . Medication side effect 06/22/2018  . Acute low back pain without sciatica 06/22/2018  . Personal history of sarcoidosis 05/26/2018  . Vitamin D deficiency 05/26/2018  . Encounter for health maintenance examination with abnormal findings 05/26/2018  . Depression, major, single episode, moderate ([place]) 05/26/2018  . Daytime sleepiness 05/26/2018  . Chest pain of uncertain etiology 96/78/9381  . GERD (gastroesophageal reflux disease) 05/26/2018  . Essential hypertension, benign 11/15/2016   . 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 Morbid (severe) obesity due to excess calories ([place]) 10/23/2016  . Common migraine with intractable migraine 10/07/2016  . DOE (dyspnea on exertion) 04/15/2016  . Hilar adenopathy 04/15/2016  . Seizures ([place]) 03/11/2016  . Anemia 11/12/2014  . Iron deficiency 11/12/2014    Past Surgical History:  Procedure Laterality Date  . ABDOMINAL HYSTERECTOMY    . ESOPHAGEAL MANOMETRY N/A 09/05/2019   Procedure: ESOPHAGEAL MANOMETRY (EM);  Surgeon: Lavena Bullion, DO;  Location: [facility];  Service: Gastroenterology;  Laterality: N/A;  . LAPAROSCOPIC OVARIAN CYSTECTOMY Left 03/11/2016   Procedure: LAPAROSCOPIC OVARIAN CYSTECTOMY;  Surgeon: Eldred Manges, MD;  Location: [facility];  Service: Gynecology;  Laterality: Left;  . LAPAROSCOPIC VAGINAL HYSTERECTOMY WITH SALPINGECTOMY Bilateral 03/11/2016   Procedure: LAPAROSCOPIC ASSISTED VAGINAL HYSTERECTOMY WITH SALPINGECTOMY;  Surgeon: Lorriane Shire  Midge Minium, MD;  Location: [facility];  Service: Gynecology;  Laterality: Bilateral;  . [place] IMPEDANCE STUDY  09/05/2019   Procedure: [place] IMPEDANCE STUDY;  Surgeon: Lavena Bullion, DO;  Location: [facility];  Service: Gastroenterology;;  . TUBAL LIGATION    . UPPER GASTROINTESTINAL ENDOSCOPY  10/11/2019   06/2019     OB History    Gravida  1   Para      Term      Preterm      AB      Living        SAB      IAB      Ectopic      Multiple      Live Births              Family History  Adopted: Yes  Problem Relation Age of Onset  . Other Son        Growing pains  . Asthma Mother   . Heart Problems Mother   . Migraines Mother   . COPD Mother   . Heart attack Mother   . Diabetes Father   . Peptic Ulcer Father   . Heart Problems Father   . COPD Father   . Heart attack Father   . Colon cancer Neg Hx   . Colon polyps Neg Hx   . Esophageal cancer Neg Hx   . Rectal cancer Neg Hx   . Stomach cancer Neg Hx     Social History   Tobacco Use   . Smoking status: Former Smoker    Packs/day: 0.25    Years: 17.00    Pack years: 4.25    Types: Cigarettes    Quit date: 03/02/2013    Years since quitting: 8.2  . Smokeless tobacco: Never Used  Vaping Use  . Vaping Use: Never used  Substance Use Topics  . Alcohol use: No    Alcohol/week: 0.0 standard drinks  . Drug use: Not Currently    Types: Marijuana    Comment: former - 6 yrs ago    Home Medications Prior to Admission medications   Medication Sig Start Date End Date Taking? Authorizing Provider  ACETAMINOPHEN EXTRA STRENGTH 500 MG tablet Take 1,000 mg by mouth every 6 (six) hours as needed for pain. 04/08/20   [provider]  albuterol (PROVENTIL) (2.5 MG/3ML) 0.083% nebulizer solution Take 2.5 mg by nebulization every 6 (six) hours as needed for wheezing or shortness of breath.    [provider]  albuterol (VENTOLIN HFA) 108 (90 Base) MCG/ACT inhaler Inhale 1-2 puffs into the lungs every 4 (four) hours as needed for wheezing or shortness of breath. 01/23/21   Parrett, Fonnie Mu, NP  ALPRAZolam (XANAX) 0.25 MG tablet Take 1 tablet (0.25 mg total) by mouth 2 (two) times daily as needed for anxiety. 10/03/20   Salley Slaughter, NP  amphetamine-dextroamphetamine (ADDERALL XR) 20 MG 24 hr capsule Take 1 capsule (20 mg total) by mouth as needed. 12/17/20   Nwoko, Terese Door, PA  beclomethasone (QVAR) 80 MCG/ACT inhaler Inhale 2 puffs into the lungs 2 (two) times daily. 01/23/21   Parrett, Fonnie Mu, NP  benzonatate (TESSALON) 100 MG capsule Take 1 capsule (100 mg total) by mouth every 8 (eight) hours. 05/12/21   Faustino Congress, NP  brompheniramine-pseudoephedrine-DM 30-2-10 MG/5ML syrup Take 5 mLs by mouth 4 (four) times daily as needed. 05/07/21   Vanessa Kick, MD  dextromethorphan-guaiFENesin [facility] DM) 30-600 MG 12hr tablet Take 1  tablet by mouth 2 (two) times daily. 01/01/21   Hazel Sams, PA-C  diphenoxylate-atropine (LOMOTIL) 2.5-0.025 MG tablet Take 1-2  tablets by mouth 4 (four) times daily as needed for diarrhea or loose stools. 05/11/21   Scot Jun, FNP  famotidine (PEPCID) 20 MG tablet Take 1 tablet (20 mg total) by mouth 2 (two) times daily. 02/10/21   Varney Biles, MD  ferrous sulfate (FER-IN-SOL) 75 (15 Fe) MG/ML SOLN Take 0.8 mLs (60 mg total) by mouth 2 (two) times daily. With food 07/22/20 08/21/20  Esterwood, Amy S, PA-C  GAVILAX 17 GM/SCOOP powder Take 17 g by mouth daily. 05/20/20   [provider]  HYDROcodone-acetaminophen (NORCO/VICODIN) 5-325 MG tablet Take 1 tablet by mouth every 4 (four) hours as needed for moderate pain. Patient not taking: Reported on 01/23/2021 08/16/20   Lavonia Drafts, MD  metFORMIN (GLUCOPHAGE) 500 MG tablet Take 500 mg by mouth 2 (two) times daily with a meal.    [provider]  metoCLOPramide (REGLAN) 10 MG tablet Take 1 tablet (10 mg total) by mouth 3 (three) times daily before meals. 02/12/21   Maudie Flakes, MD  nirmatrelvir/ritonavir EUA (PAXLOVID) TABS Take 3 tablets by mouth 2 (two) times daily for 5 days. Patient GFR is > 60. Take nirmatrelvir (150 mg) two tablets twice daily for 5 days and ritonavir (100 mg) one tablet twice daily for 5 days. 05/18/21 05/23/21  Corena Herter, PA-C  ondansetron (ZOFRAN) 4 MG tablet Take 4 mg by mouth every 8 (eight) hours as needed. 05/13/20   [provider]  oxyCODONE-acetaminophen (PERCOCET/ROXICET) 5-325 MG tablet Take 2 tablets by mouth every 12 (twelve) hours as needed for severe pain. 02/10/21   Varney Biles, MD  pantoprazole (PROTONIX) 40 MG tablet Take 1 tablet (40 mg total) by mouth 2 (two) times daily for 14 days. 09/15/20 09/29/20  Lavonia Drafts, MD  potassium chloride SA (KLOR-CON) 20 MEQ tablet Take 1 tablet (20 mEq total) by mouth 2 (two) times daily. 07/23/20   Esterwood, Amy S, PA-C  promethazine (PROMETHEGAN) 12.5 MG suppository Place 1 suppository (12.5 mg total) rectally every 6 (six) hours as needed for nausea or  vomiting. 08/16/20   Lavonia Drafts, MD  sucralfate (CARAFATE) 1 g tablet Take 1 tablet (1 g total) by mouth 4 (four) times daily -  with meals and at bedtime. 02/10/21   Varney Biles, MD  SUMAtriptan (IMITREX) 100 MG tablet TAKE 1 TABLET BY MOUTH 2X DAILY AS NEEDED FOR MIGRAINE. MAY REPEAT IN 2 HOURS IF HEADACHE PERSISTS OR RECURS. 12/11/19   Kathrynn Ducking, MD  traMADol (ULTRAM) 50 MG tablet Take 50 mg by mouth every 6 (six) hours as needed for moderate pain.  03/27/20   [provider]  traZODone (DESYREL) 50 MG tablet Take 1 tablet (50 mg total) by mouth at bedtime as needed for sleep. 10/03/20   Salley Slaughter, NP  chlorthalidone (HYGROTON) 25 MG tablet TAKE 1 TABLET BY MOUTH EVERY DAY Patient taking differently: Take 25 mg by mouth daily.  11/05/19 01/01/21  Ngetich, Nelda Bucks, NP    Allergies    Aspirin  Review of Systems   Review of Systems  All other systems reviewed and are negative.   Physical Exam Updated Vital Signs BP (!) 136/93   Pulse (!) 58   Temp (!) 97.4 F (36.3 C) (Oral)   Resp 16   Ht 5\' 11"  (1.803 m)   Wt 86.2 kg   LMP 02/19/2016  SpO2 100%   BMI 26.50 kg/m   Physical Exam Vitals and nursing note reviewed. Exam conducted with a chaperone present.  Constitutional:      General: She is not in acute distress.    Appearance: Normal appearance. She is ill-appearing.  HENT:     Head: Normocephalic and atraumatic.  Eyes:     General: No scleral icterus.    Conjunctiva/sclera: Conjunctivae normal.  Cardiovascular:     Rate and Rhythm: Normal rate.     Pulses: Normal pulses.  Pulmonary:     Effort: Pulmonary effort is normal. No respiratory distress.  Abdominal:     General: Abdomen is flat. There is no distension.     Palpations: Abdomen is soft.     Tenderness: There is no abdominal tenderness.  Musculoskeletal:     Cervical back: Normal range of motion.  Skin:    General: Skin is dry.  Neurological:     Mental Status: She is alert.      GCS: GCS eye subscore is 4. GCS verbal subscore is 5. GCS motor subscore is 6.  Psychiatric:        Mood and Affect: Mood normal.        Behavior: Behavior normal.        Thought Content: Thought content normal.     ED Results / Procedures / Treatments   Labs (all labs ordered are listed, but only abnormal results are displayed) Labs Reviewed  CBC WITH DIFFERENTIAL/PLATELET  COMPREHENSIVE METABOLIC PANEL  TROPONIN I (HIGH SENSITIVITY)    EKG None  Radiology DG Chest Portable 1 View  Result Date: 05/18/2021 CLINICAL DATA:  Chest pain shortness of breath with history of COVID. EXAM: PORTABLE CHEST 1 VIEW COMPARISON:  January 28, 2021. FINDINGS: EKG leads project over the chest. Trachea midline. Cardiomediastinal contours and hilar structures are normal. Lungs are clear. No sign of effusion on frontal radiograph. On limited assessment no acute skeletal process. IMPRESSION: No acute cardiopulmonary disease. Electronically Signed   By: Zetta Bills M.D.   On: 05/18/2021 14:27    Procedures Procedures   Medications Ordered in ED Medications - No data to display  ED Course  I have reviewed the triage vital signs and the nursing notes.  Pertinent labs & imaging results that were available during my care of the patient were reviewed by me and considered in my medical decision making (see chart for details).    MDM Rules/Calculators/A&P                          Brittiny Nestel was evaluated in Emergency Department on 05/18/2021 for the symptoms described in the history of present illness. She was evaluated in the context of the global COVID-19 pandemic, which necessitated consideration that the patient might be at risk for infection with the SARS-CoV-2 virus that causes COVID-19. Institutional protocols and algorithms that pertain to the evaluation of patients at risk for COVID-19 are in a state of rapid change based on information released by regulatory bodies including the CDC and  federal and state organizations. These policies and algorithms were followed during the patient's care in the ED.  I personally reviewed patient's medical chart and all notes from triage and staff during today's encounter. I have also ordered and reviewed all labs and imaging that I felt to be medically necessary in the evaluation of this patient's complaints and with consideration of their physical exam. If needed, translation services were available and  utilized.   I spoke with pharmacy, they still have not yet received the prescription.  There was a note from Dr. Laverta Baltimore last evening stating that he had also tried to resend it.  Evidently there is an issue with the electronic prescriptions.  I asked if it was okay if the prescription be printed and he stated that would be perfectly fine.  We will provide patient with a printed prescription for her Paxil so that she can fill it at a pharmacy, preferably one of the [facility] centers.  Laboratory work-up here in the ED is entirely reassuring.  One-time troponin was less than 2, did not trend.  CBC and CMP without derangement.  Chest x-ray without any acute cardiopulmonary disease.  She is oxygenating at 100% on room air.  She is not exhibiting any increased work of breathing.  PERC negative.  Patient continues to endorse headache, but will not take NSAIDs or Tylenol.  Explained that narcotics are not indicated for her COVID-19 headache symptoms.  She can follow-up with her primary care provider for ongoing evaluation and management.  ER return precautions discussed.  Patient voices understanding is agreeable to plan  Final Clinical Impression(s) / ED Diagnoses Final diagnoses:  COVID-19    Rx / DC Orders ED Discharge Orders         Ordered    nirmatrelvir/ritonavir EUA (PAXLOVID) TABS  2 times daily,   Status:  Discontinued        05/18/21 1447    nirmatrelvir/ritonavir EUA (PAXLOVID) TABS  2 times daily        05/18/21 1455            Reita Chard 05/18/21 1631    Fredia Sorrow, MD 05/23/21 0101

## 2021-05-18 NOTE — ED Triage Notes (Signed)
Pt presents to ED, states she was covid positive on 5/18, symptoms are getting worse, she is aching all over, dizzy. Did not get her rx filled

## 2021-05-18 NOTE — ED Triage Notes (Signed)
Patient is being discharged from the Urgent Care and sent to the Emergency Department via private vehicle  . Per Kirkland Hun , patient is in need of higher level of care due to chest pain sob post covid . Patient is aware and verbalizes understanding of plan of care.  Vitals:   05/18/21 0928  BP: 120/89  Pulse: 74  Resp: 18  Temp: 97.7 F (36.5 C)  SpO2: 98%

## 2021-05-28 ENCOUNTER — Ambulatory Visit: Payer: Self-pay

## 2021-06-02 ENCOUNTER — Ambulatory Visit: Payer: Self-pay

## 2021-06-03 ENCOUNTER — Ambulatory Visit: Payer: Self-pay

## 2021-06-04 ENCOUNTER — Ambulatory Visit: Payer: Self-pay

## 2021-06-05 ENCOUNTER — Other Ambulatory Visit: Payer: Self-pay

## 2021-06-05 ENCOUNTER — Ambulatory Visit
Admission: RE | Admit: 2021-06-05 | Discharge: 2021-06-05 | Disposition: A | Discharge status: Home / Self Care | Payer: 59 | Source: Ambulatory Visit | Attending: Emergency Medicine | Admitting: Emergency Medicine

## 2021-06-05 VITALS — BP 134/89 | HR 68 | Temp 98.2°F | Resp 16

## 2021-06-05 DIAGNOSIS — G43009 Migraine without aura, not intractable, without status migrainosus: Secondary | ICD-10-CM

## 2021-06-05 MED ORDER — DEXAMETHASONE SODIUM PHOSPHATE 10 MG/ML IJ SOLN
10.0000 mg | Freq: Once | INTRAMUSCULAR | Status: AC
  Administered 2021-06-05: 10 mg via INTRAMUSCULAR

## 2021-06-05 MED ORDER — SUMATRIPTAN SUCCINATE 6 MG/0.5ML ~~LOC~~ SOLN
6.0000 mg | Freq: Once | SUBCUTANEOUS | Status: AC
  Administered 2021-06-05: 6 mg via SUBCUTANEOUS

## 2021-06-05 NOTE — Discharge Instructions (Addendum)
Migraine cocktail given in office Rest and drink plenty of fluids Use OTC medications as needed for symptomatic relief Follow up with PCP if symptoms persists Return or go to the ER if you have any new or worsening symptoms such as fever, chills, nausea, vomiting, chest pain, shortness of breath, cough, vision changes, worsening headache despite treatment, slurred speech, facial asymmetry, weakness in arms or legs, etc..Marland Kitchen

## 2021-06-05 NOTE — ED Provider Notes (Signed)
[place]   [id number] 06/05/21 Arrival Time: 0945  WN:[id number]  SUBJECTIVE:  Victoria Moore is a 45 y.o. female who complains of worsening persistent migraine x 3 days.  Denies a precipitating event, or recent head trauma.  Patient localizes her pain to the top of head head.  Describes the pain as intermittent and throbbing in character.  Patient has tried OTC medications without relief. Symptoms are made worse with light and sounds.  Reports similar symptoms in the past that improved with migraine cocktail.  Patient denies fever, chills, nausea, vomiting, chest pain, SOB, abdominal pain, weakness, numbness or tingling, slurred speech.    Recent covid diagnosis on 5/18   ROS: As per HPI.  All other pertinent ROS negative.     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 Fibromyalgia, doubt sarcoid    Asthma    Blood transfusion without reported diagnosis    Chest wall pain 05/26/2018   Chronic kidney disease    Chronic low back pain with sciatica 09/15/2018   Common migraine with intractable migraine 10/07/2016   Cough variant asthma vs UACS  10/23/2016   - Spirometry 04/15/2016  Very truncated exp loop effort dep portion only  - Allergy profile 10/22/2016 >  Eos 0.2/  IgE  52 RAST POS grass/trees/ragweed  10/22/2016  After extensive coaching HFA effectiveness =    75% try duelra 100 2bid > improved   Daytime sleepiness 05/26/2018   Decreased pedal pulses 10/04/2018   Depression    Depression, major, single episode, moderate ([place]) 05/26/2018   Diabetes ([place])    Dyspnea 04/15/2016   04/15/2016  Walked RA x 3 laps @ 185 ft each stopped due to  End of study, nl pace, no sob or desat    - Spirometry 04/15/2016  Very truncated exp loop effort dep portion only  - 10/22/2016  Walked RA x 3 laps @  185 ft each stopped due to  End of study, slow pace, min sob/ no desat - full pfts rec 10/22/2016 >>>       Edema    Essential hypertension, benign 11/15/2016   Fibroids 03/11/2016   Frequency of urination 09/11/2018   GERD (gastroesophageal reflux disease)    Herniated lumbar intervertebral disc    Hiatal hernia    Hilar adenopathy    Hypertension    Hypokalemia 06/28/2018   Impaired fasting blood sugar 09/11/2018   Iron deficiency    Leg pain 12/07/2018   Low libido 11/27/2018   Migraine    Mild sleep apnea 08/03/2018   HST 06/20/18  AHI  8.1 / snoring with 02 nadir 80% >  08/03/2018 rec sleep medicine consultation    Morbid (severe) obesity due to excess calories ([place]) 10/23/2016   Personal history of sarcoidosis 05/26/2018   Prolonged capillary refill time    Right leg swelling 08/18/2018   Sarcoidosis    personal history of   Seizures ([place])    history of    Sleep apnea    Spondylosis    Vitamin D deficiency    Past Surgical History:  Procedure Laterality Date   ABDOMINAL HYSTERECTOMY     ESOPHAGEAL MANOMETRY N/A 09/05/2019   Procedure: ESOPHAGEAL MANOMETRY (EM);  Surgeon:  Cirigliano, Dominic Pea, DO;  Location: [facility];  Service: Gastroenterology;  Laterality: N/A;   LAPAROSCOPIC OVARIAN CYSTECTOMY Left 03/11/2016   Procedure: LAPAROSCOPIC OVARIAN CYSTECTOMY;  Surgeon: Eldred Manges, MD;  Location: [facility];  Service: Gynecology;  Laterality: Left;   LAPAROSCOPIC VAGINAL HYSTERECTOMY WITH SALPINGECTOMY Bilateral 03/11/2016   Procedure: LAPAROSCOPIC ASSISTED VAGINAL HYSTERECTOMY WITH SALPINGECTOMY;  Surgeon: Eldred Manges, MD;  Location: [facility];  Service: Gynecology;  Laterality: Bilateral;   [place] IMPEDANCE STUDY  09/05/2019   Procedure: [place] IMPEDANCE STUDY;  Surgeon: Lavena Bullion, DO;  Location: [facility];  Service: Gastroenterology;;   TUBAL LIGATION     UPPER GASTROINTESTINAL ENDOSCOPY  10/11/2019   06/2019   Allergies  Allergen Reactions   Aspirin Anaphylaxis and Hives         Current Facility-Administered Medications on File Prior to Encounter  Medication Dose Route Frequency Provider Last Rate Last Admin   nitroGLYCERIN (NITROSTAT) SL tablet 0.4 mg  0.4 mg Sublingual Q5 min PRN Ngetich, Dinah C, NP   0.4 mg at 09/14/19 1621   Current Outpatient Medications on File Prior to Encounter  Medication Sig Dispense Refill   ACETAMINOPHEN EXTRA STRENGTH 500 MG tablet Take 1,000 mg by mouth every 6 (six) hours as needed for pain.     albuterol (PROVENTIL) (2.5 MG/3ML) 0.083% nebulizer solution Take 2.5 mg by nebulization every 6 (six) hours as needed for wheezing or shortness of breath.     albuterol (VENTOLIN HFA) 108 (90 Base) MCG/ACT inhaler Inhale 1-2 puffs into the lungs every 4 (four) hours as needed for wheezing or shortness of breath. 8 g 3   ALPRAZolam (XANAX) 0.25 MG tablet Take 1 tablet (0.25 mg total) by mouth 2 (two) times daily as needed for anxiety. 60 tablet 0   amphetamine-dextroamphetamine (ADDERALL XR) 20 MG 24 hr capsule Take 1 capsule (20 mg total) by mouth as needed. 30 capsule 0   beclomethasone (QVAR) 80 MCG/ACT inhaler Inhale 2 puffs into the lungs 2 (two) times daily. 1 each 6   benzonatate (TESSALON) 100 MG capsule Take 1 capsule (100 mg total) by mouth every 8 (eight) hours. 21 capsule 0   brompheniramine-pseudoephedrine-DM 30-2-10 MG/5ML syrup Take 5 mLs by mouth 4 (four) times daily as needed. 120 mL 0   dextromethorphan-guaiFENesin (MUCINEX DM) 30-600 MG 12hr tablet Take 1 tablet by mouth 2 (two) times daily. 30 tablet 0   diphenoxylate-atropine (LOMOTIL) 2.5-0.025 MG tablet Take 1-2 tablets by mouth 4 (four) times daily as needed for diarrhea or loose stools. 30 tablet 0   famotidine (PEPCID) 20 MG tablet Take 1 tablet (20 mg total) by mouth 2 (two) times daily. 30 tablet 0   ferrous sulfate (FER-IN-SOL) 75 (15 Fe) MG/ML SOLN Take 0.8 mLs (60 mg total) by mouth 2 (two) times daily. With food 50 mL 0   GAVILAX 17 GM/SCOOP powder Take 17 g  by mouth daily.     HYDROcodone-acetaminophen (NORCO/VICODIN) 5-325 MG tablet Take 1 tablet by mouth every 4 (four) hours as needed for moderate pain. (Patient not taking: Reported on 01/23/2021) 20 tablet 0   metFORMIN (GLUCOPHAGE) 500 MG tablet Take 500 mg by mouth 2 (two) times daily with a meal.     metoCLOPramide (REGLAN) 10 MG tablet Take 1 tablet (10 mg total) by mouth 3 (three) times daily before meals. 30 tablet 1   ondansetron (ZOFRAN) 4 MG tablet Take 4 mg by mouth every 8 (eight) hours as needed.     oxyCODONE-acetaminophen (  PERCOCET/ROXICET) 5-325 MG tablet Take 2 tablets by mouth every 12 (twelve) hours as needed for severe pain. 8 tablet 0   pantoprazole (PROTONIX) 40 MG tablet Take 1 tablet (40 mg total) by mouth 2 (two) times daily for 14 days. 28 tablet 0   potassium chloride SA (KLOR-CON) 20 MEQ tablet Take 1 tablet (20 mEq total) by mouth 2 (two) times daily. 10 tablet 0   promethazine (PROMETHEGAN) 12.5 MG suppository Place 1 suppository (12.5 mg total) rectally every 6 (six) hours as needed for nausea or vomiting. 12 each 0   sucralfate (CARAFATE) 1 g tablet Take 1 tablet (1 g total) by mouth 4 (four) times daily -  with meals and at bedtime. 60 tablet 0   SUMAtriptan (IMITREX) 100 MG tablet TAKE 1 TABLET BY MOUTH 2X DAILY AS NEEDED FOR MIGRAINE. MAY REPEAT IN 2 HOURS IF HEADACHE PERSISTS OR RECURS. 9 tablet 3   traMADol (ULTRAM) 50 MG tablet Take 50 mg by mouth every 6 (six) hours as needed for moderate pain.      traZODone (DESYREL) 50 MG tablet Take 1 tablet (50 mg total) by mouth at bedtime as needed for sleep. 50 tablet 2   [DISCONTINUED] chlorthalidone (HYGROTON) 25 MG tablet TAKE 1 TABLET BY MOUTH EVERY DAY (Patient taking differently: Take 25 mg by mouth daily. ) 90 tablet 1   Social History   Socioeconomic History   Marital status: Significant Other    Spouse name: Not on file   Number of children: 1   Years of education: 11   Highest education level: Not on file   Occupational History   Occupation: [employer]  Tobacco Use   Smoking status: Former    Packs/day: 0.25    Years: 17.00    Pack years: 4.25    Types: Cigarettes    Quit date: 03/02/2013    Years since quitting: 8.2   Smokeless tobacco: Never  Vaping Use   Vaping Use: Never used  Substance and Sexual Activity   Alcohol use: No    Alcohol/week: 0.0 standard drinks   Drug use: Not Currently    Types: Marijuana    Comment: former - 6 yrs ago   Sexual activity: Yes    Birth control/protection: Surgical  Other Topics Concern   Not on file  Social History Narrative   She lives w/ her fiance'. She has one son.    Highest level of education:  12th grade, did not graduate. Currently back in school.   Right-handed   Caffeine: 1 cup of coffee some mornings      Social History      Diet? No       Do you drink/eat things with caffeine? yes      Marital status?           Single                          What year were you married?       Do you live in a house, apartment, assisted living, condo, trailer, etc.? house      Is it one or more stories?     How many persons live in your home?  2      Do you have any pets in your home? (please list)   No       Highest level of education completed? Some college       Current or past profession:  Do you exercise?       Yes                                Type & how often? Daily       Advanced Directives      Do you have a living will? No       Do you have a DNR form?                                  If not, do you want to discuss one? No       Do you have signed POA/HPOA for forms?  No       Functional Status      Do you have difficulty bathing or dressing yourself? No       Do you have difficulty preparing food or eating? No       Do you have difficulty managing your medications? No       Do you have difficulty managing your finances? No       Do you have difficulty affording your medications? No       Social  Determinants of Radio broadcast assistant Strain: Not on file  Food Insecurity: Not on file  Transportation Needs: Not on file  Physical Activity: Not on file  Stress: Not on file  Social Connections: Not on file  Intimate Partner Violence: Not on file   Family History  Adopted: Yes  Problem Relation Age of Onset   Other Son        Growing pains   Asthma Mother    Heart Problems Mother    Migraines Mother    COPD Mother    Heart attack Mother    Diabetes Father    Peptic Ulcer Father    Heart Problems Father    COPD Father    Heart attack Father    Colon cancer Neg Hx    Colon polyps Neg Hx    Esophageal cancer Neg Hx    Rectal cancer Neg Hx    Stomach cancer Neg Hx     OBJECTIVE:  Vitals:   06/05/21 1006  BP: 134/89  Pulse: 68  Resp: 16  Temp: 98.2 F (36.8 C)  TempSrc: Oral  SpO2: 99%    General appearance: alert; no distress; appears uncomfortable Eyes: PERRLA; EOMI HENT: normocephalic; atraumatic Neck: supple with FROM Lungs: clear to auscultation bilaterally Heart: regular rate and rhythm.  Extremities: no edema; symmetrical with no gross deformities Skin: warm and dry Neurologic: CN 2-12 grossly intact; normal gait; grip strength intact about the UEs; negative pronator drift Psychological: alert and cooperative; normal mood and affect   ASSESSMENT & PLAN:  1. Migraine without aura and without status migrainosus, not intractable     Meds ordered this encounter  Medications   dexamethasone (DECADRON) injection 10 mg   SUMAtriptan (IMITREX) injection 6 mg    Migraine cocktail given in office Rest and drink plenty of fluids Use OTC medications as needed for symptomatic relief Follow up with PCP if symptoms persists Return or go to the ER if you have any new or worsening symptoms such as fever, chills, nausea, vomiting, chest pain, shortness of breath, cough, vision changes, worsening headache despite treatment, slurred speech, facial  asymmetry, weakness in arms or legs, etc...  Reviewed expectations re: course  of current medical issues. Questions answered. Outlined signs and symptoms indicating need for more acute intervention. Patient verbalized understanding. After Visit Summary given.    Lestine Box, PA-C 06/05/21 1059

## 2021-06-05 NOTE — ED Triage Notes (Signed)
Pt having headache that started x 3 days ago.  Had covid recently.  Was supposed to return to work on Monday.

## 2021-08-20 ENCOUNTER — Encounter: Payer: Self-pay | Admitting: *Deleted

## 2021-08-20 ENCOUNTER — Ambulatory Visit: Payer: Self-pay

## 2021-08-20 ENCOUNTER — Other Ambulatory Visit: Payer: Self-pay

## 2021-08-20 ENCOUNTER — Ambulatory Visit
Admission: EM | Admit: 2021-08-20 | Discharge: 2021-08-20 | Disposition: A | Discharge status: Home / Self Care | Payer: 59 | Attending: Emergency Medicine | Admitting: Emergency Medicine

## 2021-08-20 DIAGNOSIS — G43009 Migraine without aura, not intractable, without status migrainosus: Secondary | ICD-10-CM | POA: Diagnosis not present

## 2021-08-20 MED ORDER — DEXAMETHASONE SODIUM PHOSPHATE 10 MG/ML IJ SOLN
10.0000 mg | Freq: Once | INTRAMUSCULAR | Status: AC
  Administered 2021-08-20: 10 mg via INTRAMUSCULAR

## 2021-08-20 MED ORDER — SUMATRIPTAN SUCCINATE 6 MG/0.5ML ~~LOC~~ SOLN
6.0000 mg | Freq: Once | SUBCUTANEOUS | Status: AC
  Administered 2021-08-20: 6 mg via SUBCUTANEOUS

## 2021-08-20 MED ORDER — ONDANSETRON 4 MG PO TBDP
4.0000 mg | ORAL_TABLET | Freq: Once | ORAL | Status: AC
  Administered 2021-08-20: 4 mg via ORAL

## 2021-08-20 NOTE — Discharge Instructions (Signed)
Migraine cocktail given in office Rest and drink plenty of fluids Use OTC medications as needed for symptomatic relief Follow up with PCP if symptoms persists Return or go to the ER if you have any new or worsening symptoms such as fever, chills, nausea, vomiting, chest pain, shortness of breath, cough, vision changes, worsening headache despite treatment, slurred speech, facial asymmetry, weakness in arms or legs, etc..Marland Kitchen

## 2021-08-20 NOTE — ED Triage Notes (Signed)
Migraine for 2 weeks home meds not working. Pt feels fatigued.

## 2021-08-20 NOTE — ED Provider Notes (Signed)
[place]   NO:[id number] 08/20/21 Arrival Time: 0841  [id number]  SUBJECTIVE:  Victoria Moore is a 45 y.o. female who complains of migraine for 2 weeks.  Denies a precipitating event, or recent head trauma.  Pain is diffuse.  Describes the pain as constant and throbbing in character.  Patient has tried OTC medications without relief. Symptoms are made worse with light.  Reports similar symptoms in the past that improved with migraine cocktail  This is not the worst headache of their life.  Complains of nausea.  Patient denies fever, chills, vomiting, aura, rhinorrhea, watery eyes, chest pain, SOB, abdominal pain, weakness, numbness or tingling, slurred speech.     ROS: As per HPI.  All other pertinent ROS negative.     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 Fibromyalgia, doubt sarcoid    Asthma    Blood transfusion without reported diagnosis    Chest wall pain 05/26/2018   Chronic kidney disease    Chronic low back pain with sciatica 09/15/2018   Common migraine with intractable migraine 10/07/2016   Cough variant asthma vs UACS  10/23/2016   - Spirometry 04/15/2016  Very truncated exp loop effort dep portion only  - Allergy profile 10/22/2016 >  Eos 0.2/  IgE  52 RAST POS grass/trees/ragweed  10/22/2016  After extensive coaching HFA effectiveness =    75% try duelra 100 2bid > improved   Daytime sleepiness 05/26/2018   Decreased pedal pulses 10/04/2018   Depression    Depression, major, single episode, moderate ([place]) 05/26/2018   Diabetes ([place])    Dyspnea 04/15/2016   04/15/2016  Walked RA x 3 laps @ 185 ft each stopped due to  End of study, nl pace, no sob or desat    - Spirometry 04/15/2016  Very truncated exp loop effort dep portion only  - 10/22/2016  Walked RA x 3 laps @ 185 ft  each stopped due to  End of study, slow pace, min sob/ no desat - full pfts rec 10/22/2016 >>>       Edema    Essential hypertension, benign 11/15/2016   Fibroids 03/11/2016   Frequency of urination 09/11/2018   GERD (gastroesophageal reflux disease)    Herniated lumbar intervertebral disc    Hiatal hernia    Hilar adenopathy    Hypertension    Hypokalemia 06/28/2018   Impaired fasting blood sugar 09/11/2018   Iron deficiency    Leg pain 12/07/2018   Low libido 11/27/2018   Migraine    Mild sleep apnea 08/03/2018   HST 06/20/18  AHI  8.1 / snoring with 02 nadir 80% >  08/03/2018 rec sleep medicine consultation    Morbid (severe) obesity due to excess calories ([place]) 10/23/2016   Personal history of sarcoidosis 05/26/2018   Prolonged capillary refill time    Right leg swelling 08/18/2018   Sarcoidosis    personal history of   Seizures ([place])    history of    Sleep apnea    Spondylosis    Vitamin D deficiency    Past Surgical History:  Procedure Laterality Date   ABDOMINAL HYSTERECTOMY     ESOPHAGEAL MANOMETRY N/A 09/05/2019   Procedure: ESOPHAGEAL MANOMETRY (EM);  Surgeon:  Cirigliano, Dominic Pea, DO;  Location: [facility];  Service: Gastroenterology;  Laterality: N/A;   LAPAROSCOPIC OVARIAN CYSTECTOMY Left 03/11/2016   Procedure: LAPAROSCOPIC OVARIAN CYSTECTOMY;  Surgeon: Eldred Manges, MD;  Location: [facility];  Service: Gynecology;  Laterality: Left;   LAPAROSCOPIC VAGINAL HYSTERECTOMY WITH SALPINGECTOMY Bilateral 03/11/2016   Procedure: LAPAROSCOPIC ASSISTED VAGINAL HYSTERECTOMY WITH SALPINGECTOMY;  Surgeon: Eldred Manges, MD;  Location: [facility];  Service: Gynecology;  Laterality: Bilateral;   [place] IMPEDANCE STUDY  09/05/2019   Procedure: [place] IMPEDANCE STUDY;  Surgeon: Lavena Bullion, DO;  Location: [facility];  Service: Gastroenterology;;   TUBAL LIGATION     UPPER GASTROINTESTINAL ENDOSCOPY  10/11/2019   06/2019   Allergies  Allergen Reactions   Aspirin Anaphylaxis and Hives         Current Facility-Administered Medications on File Prior to Encounter  Medication Dose Route Frequency Provider Last Rate Last Admin   nitroGLYCERIN (NITROSTAT) SL tablet 0.4 mg  0.4 mg Sublingual Q5 min PRN Ngetich, Dinah C, NP   0.4 mg at 09/14/19 1621   Current Outpatient Medications on File Prior to Encounter  Medication Sig Dispense Refill   ACETAMINOPHEN EXTRA STRENGTH 500 MG tablet Take 1,000 mg by mouth every 6 (six) hours as needed for pain.     albuterol (PROVENTIL) (2.5 MG/3ML) 0.083% nebulizer solution Take 2.5 mg by nebulization every 6 (six) hours as needed for wheezing or shortness of breath.     albuterol (VENTOLIN HFA) 108 (90 Base) MCG/ACT inhaler Inhale 1-2 puffs into the lungs every 4 (four) hours as needed for wheezing or shortness of breath. 8 g 3   ALPRAZolam (XANAX) 0.25 MG tablet Take 1 tablet (0.25 mg total) by mouth 2 (two) times daily as needed for anxiety. 60 tablet 0   amphetamine-dextroamphetamine (ADDERALL XR) 20 MG 24 hr capsule Take 1 capsule (20 mg total) by mouth as needed. 30 capsule 0   beclomethasone (QVAR) 80 MCG/ACT inhaler Inhale 2 puffs into the lungs 2 (two) times daily. 1 each 6   benzonatate (TESSALON) 100 MG capsule Take 1 capsule (100 mg total) by mouth every 8 (eight) hours. 21 capsule 0   brompheniramine-pseudoephedrine-DM 30-2-10 MG/5ML syrup Take 5 mLs by mouth 4 (four) times daily as needed. 120 mL 0   dextromethorphan-guaiFENesin (MUCINEX DM) 30-600 MG 12hr tablet Take 1 tablet by mouth 2 (two) times daily. 30 tablet 0   diphenoxylate-atropine (LOMOTIL) 2.5-0.025 MG tablet Take 1-2 tablets by mouth 4 (four) times daily as needed for diarrhea or loose stools. 30 tablet 0   famotidine (PEPCID) 20 MG tablet Take 1 tablet (20 mg total) by mouth 2 (two) times daily. 30 tablet 0   ferrous sulfate (FER-IN-SOL) 75 (15 Fe) MG/ML SOLN Take 0.8 mLs (60 mg total) by mouth 2 (two) times daily. With food 50 mL 0   GAVILAX 17 GM/SCOOP powder Take 17 g by  mouth daily.     HYDROcodone-acetaminophen (NORCO/VICODIN) 5-325 MG tablet Take 1 tablet by mouth every 4 (four) hours as needed for moderate pain. (Patient not taking: Reported on 01/23/2021) 20 tablet 0   metFORMIN (GLUCOPHAGE) 500 MG tablet Take 500 mg by mouth 2 (two) times daily with a meal.     metoCLOPramide (REGLAN) 10 MG tablet Take 1 tablet (10 mg total) by mouth 3 (three) times daily before meals. 30 tablet 1   ondansetron (ZOFRAN) 4 MG tablet Take 4 mg by mouth every 8 (eight) hours as needed.     oxyCODONE-acetaminophen (  PERCOCET/ROXICET) 5-325 MG tablet Take 2 tablets by mouth every 12 (twelve) hours as needed for severe pain. 8 tablet 0   pantoprazole (PROTONIX) 40 MG tablet Take 1 tablet (40 mg total) by mouth 2 (two) times daily for 14 days. 28 tablet 0   potassium chloride SA (KLOR-CON) 20 MEQ tablet Take 1 tablet (20 mEq total) by mouth 2 (two) times daily. 10 tablet 0   promethazine (PROMETHEGAN) 12.5 MG suppository Place 1 suppository (12.5 mg total) rectally every 6 (six) hours as needed for nausea or vomiting. 12 each 0   sucralfate (CARAFATE) 1 g tablet Take 1 tablet (1 g total) by mouth 4 (four) times daily -  with meals and at bedtime. 60 tablet 0   SUMAtriptan (IMITREX) 100 MG tablet TAKE 1 TABLET BY MOUTH 2X DAILY AS NEEDED FOR MIGRAINE. MAY REPEAT IN 2 HOURS IF HEADACHE PERSISTS OR RECURS. 9 tablet 3   traMADol (ULTRAM) 50 MG tablet Take 50 mg by mouth every 6 (six) hours as needed for moderate pain.      traZODone (DESYREL) 50 MG tablet Take 1 tablet (50 mg total) by mouth at bedtime as needed for sleep. 50 tablet 2   [DISCONTINUED] chlorthalidone (HYGROTON) 25 MG tablet TAKE 1 TABLET BY MOUTH EVERY DAY (Patient taking differently: Take 25 mg by mouth daily. ) 90 tablet 1   Social History   Socioeconomic History   Marital status: Significant Other    Spouse name: Not on file   Number of children: 1   Years of education: 11   Highest education level: Not on file   Occupational History   Occupation: [employer]  Tobacco Use   Smoking status: Former    Packs/day: 0.25    Years: 17.00    Pack years: 4.25    Types: Cigarettes    Quit date: 03/02/2013    Years since quitting: 8.4   Smokeless tobacco: Never  Vaping Use   Vaping Use: Never used  Substance and Sexual Activity   Alcohol use: No    Alcohol/week: 0.0 standard drinks   Drug use: Not Currently    Types: Marijuana    Comment: former - 6 yrs ago   Sexual activity: Yes    Birth control/protection: Surgical  Other Topics Concern   Not on file  Social History Narrative   She lives w/ her fiance'. She has one son.    Highest level of education:  12th grade, did not graduate. Currently back in school.   Right-handed   Caffeine: 1 cup of coffee some mornings      Social History      Diet? No       Do you drink/eat things with caffeine? yes      Marital status?           Single                          What year were you married?       Do you live in a house, apartment, assisted living, condo, trailer, etc.? house      Is it one or more stories?     How many persons live in your home?  2      Do you have any pets in your home? (please list)   No       Highest level of education completed? Some college       Current or past profession:  Do you exercise?       Yes                                Type & how often? Daily       Advanced Directives      Do you have a living will? No       Do you have a DNR form?                                  If not, do you want to discuss one? No       Do you have signed POA/HPOA for forms?  No       Functional Status      Do you have difficulty bathing or dressing yourself? No       Do you have difficulty preparing food or eating? No       Do you have difficulty managing your medications? No       Do you have difficulty managing your finances? No       Do you have difficulty affording your medications? No       Social  Determinants of Radio broadcast assistant Strain: Not on file  Food Insecurity: Not on file  Transportation Needs: Not on file  Physical Activity: Not on file  Stress: Not on file  Social Connections: Not on file  Intimate Partner Violence: Not on file   Family History  Adopted: Yes  Problem Relation Age of Onset   Other Son        Growing pains   Asthma Mother    Heart Problems Mother    Migraines Mother    COPD Mother    Heart attack Mother    Diabetes Father    Peptic Ulcer Father    Heart Problems Father    COPD Father    Heart attack Father    Colon cancer Neg Hx    Colon polyps Neg Hx    Esophageal cancer Neg Hx    Rectal cancer Neg Hx    Stomach cancer Neg Hx     OBJECTIVE:  Vitals:   08/20/21 0854  BP: 118/81  Pulse: 81  Resp: 18  Temp: 98.5 F (36.9 C)  SpO2: 98%    General appearance: alert; no distress;  appears uncomfortable, sitting in exam chair with eyes closed upon entering room Eyes: PERRLA; EOMI HENT: normocephalic; atraumatic Neck: supple with FROM Lungs: clear to auscultation bilaterally Heart: regular rate and rhythm.   Extremities: no edema; symmetrical with no gross deformities Skin: warm and dry Neurologic: CN 2-12 grossly intact; normal gait; strength and sensation intact bilaterally about the upper and lower extremities Psychological: alert and cooperative; normal mood and affect   ASSESSMENT & PLAN:  1. Migraine without aura and without status migrainosus, not intractable     Meds ordered this encounter  Medications   dexamethasone (DECADRON) injection 10 mg   SUMAtriptan (IMITREX) injection 6 mg   ondansetron (ZOFRAN-ODT) disintegrating tablet 4 mg    Migraine cocktail given in office Rest and drink plenty of fluids Use OTC medications as needed for symptomatic relief Follow up with PCP if symptoms persists Return or go to the ER if you have any new or worsening symptoms such as fever, chills, nausea, vomiting, chest  pain, shortness of breath, cough, vision changes,  worsening headache despite treatment, slurred speech, facial asymmetry, weakness in arms or legs, etc...  Reviewed expectations re: course of current medical issues. Questions answered. Outlined signs and symptoms indicating need for more acute intervention. Patient verbalized understanding. After Visit Summary given.    Lestine Box, PA-C 08/20/21 [phone]

## 2021-10-01 ENCOUNTER — Ambulatory Visit
Admission: RE | Admit: 2021-10-01 | Discharge: 2021-10-01 | Disposition: A | Discharge status: Home / Self Care | Payer: 59 | Source: Ambulatory Visit | Attending: Internal Medicine | Admitting: Internal Medicine

## 2021-10-01 ENCOUNTER — Other Ambulatory Visit: Payer: Self-pay

## 2021-10-01 VITALS — BP 135/87 | HR 79 | Temp 99.0°F | Resp 18

## 2021-10-01 DIAGNOSIS — G43909 Migraine, unspecified, not intractable, without status migrainosus: Secondary | ICD-10-CM | POA: Diagnosis not present

## 2021-10-01 DIAGNOSIS — A084 Viral intestinal infection, unspecified: Secondary | ICD-10-CM

## 2021-10-01 MED ORDER — LIDOCAINE VISCOUS HCL 2 % MT SOLN
15.0000 mL | Freq: Once | OROMUCOSAL | Status: AC
  Administered 2021-10-01: 15 mL via ORAL

## 2021-10-01 MED ORDER — SUCRALFATE 1 G PO TABS: 1.0000 g | ORAL_TABLET | Freq: Three times a day (TID) | ORAL | 0 refills | Status: DC

## 2021-10-01 MED ORDER — PANTOPRAZOLE SODIUM 40 MG PO TBEC: 40.0000 mg | DELAYED_RELEASE_TABLET | Freq: Two times a day (BID) | ORAL | 1 refills | Status: DC

## 2021-10-01 MED ORDER — ALUM & MAG HYDROXIDE-SIMETH 200-200-20 MG/5ML PO SUSP
30.0000 mL | Freq: Once | ORAL | Status: AC
  Administered 2021-10-01: 30 mL via ORAL

## 2021-10-01 MED ORDER — DEXAMETHASONE SODIUM PHOSPHATE 10 MG/ML IJ SOLN
10.0000 mg | Freq: Once | INTRAMUSCULAR | Status: AC
  Administered 2021-10-01: 10 mg via INTRAMUSCULAR

## 2021-10-01 MED ORDER — SUMATRIPTAN SUCCINATE 6 MG/0.5ML ~~LOC~~ SOLN: 6.0000 mg | Freq: Once | SUBCUTANEOUS | Status: DC

## 2021-10-01 NOTE — Discharge Instructions (Addendum)
Increase oral fluid intake Take medications as prescribed We will call you with recommendations if labs are abnormal If you have worsening symptoms please return to urgent care to be reevaluated.

## 2021-10-01 NOTE — ED Triage Notes (Signed)
Diarrhea x 4 days and migraine.  States stomach hurts and when she eats, it make the pain worse.  States she feel dizzy and weak.

## 2021-10-01 NOTE — ED Provider Notes (Signed)
[facility]    CSN: [id number] Arrival date & time: 10/01/21  4132      History   Chief Complaint No chief complaint on file.   HPI Victoria Moore is a 45 y.o. female comes to the urgent care with a 4-day history of general malaise, diarrhea.  Patient has a history of migraines and is currently complaining of generalized headache associated with light sensitivity.  This is typical for her migraines.  She tried a dose of Imitrex with no improvement in her symptoms.  No nausea, vomiting.  She also has epigastric abdominal pain and abdominal cramping.  Epigastric pain is worsened by eating food and abdominal cramping worsens after she has a bowel movement.  The stool is watery with no blood in it.  It slightly mucoid.  Patient denies any history of traveling or any sick contacts.  No blood in the stool.  No abdominal bloating.  No sick contacts.  Patient has general malaise.  She also has some sore throat.  No shortness of breath or wheezing.  Patient has completed the primary vaccination series for COVID-19.  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 Fibromyalgia, doubt sarcoid    Asthma    Blood transfusion without reported diagnosis    Chest wall pain 05/26/2018   Chronic kidney disease    Chronic low back pain with sciatica 09/15/2018   Common migraine with intractable migraine 10/07/2016   Cough variant asthma vs UACS  10/23/2016   - Spirometry 04/15/2016  Very truncated exp loop effort dep portion only  - Allergy profile 10/22/2016 >  Eos 0.2/  IgE  52 RAST POS grass/trees/ragweed  10/22/2016  After extensive coaching HFA effectiveness =    75% try duelra 100 2bid > improved   Daytime sleepiness 05/26/2018   Decreased pedal pulses 10/04/2018   Depression    Depression,  major, single episode, moderate ([place]) 05/26/2018   Diabetes ([place])    Dyspnea 04/15/2016   04/15/2016  Walked RA x 3 laps @ 185 ft each stopped due to  End of study, nl pace, no sob or desat    - Spirometry 04/15/2016  Very truncated exp loop effort dep portion only  - 10/22/2016  Walked RA x 3 laps @ 185 ft each stopped due to  End of study, slow pace, min sob/ no desat - full pfts rec 10/22/2016 >>>       Edema    Essential hypertension, benign 11/15/2016   Fibroids 03/11/2016   Frequency of urination 09/11/2018   GERD (gastroesophageal reflux disease)    Herniated lumbar intervertebral disc    Hiatal hernia    Hilar adenopathy    Hypertension    Hypokalemia 06/28/2018   Impaired fasting blood sugar 09/11/2018   Iron deficiency    Leg pain 12/07/2018   Low libido 11/27/2018   Migraine    Mild sleep apnea 08/03/2018   HST 06/20/18  AHI  8.1 / snoring with 02 nadir 80% >  08/03/2018 rec sleep medicine consultation    Morbid (severe) obesity due to excess calories ([place]) 10/23/2016   Personal history of sarcoidosis 05/26/2018   Prolonged capillary refill time    Right leg swelling 08/18/2018  Sarcoidosis    personal history of   Seizures ([place])    history of    Sleep apnea    Spondylosis    Vitamin D deficiency     Patient Active Problem List   Diagnosis Date Noted   Asthmatic bronchitis 01/23/2021   COVID-19 virus infection 01/23/2021   Social anxiety disorder 10/03/2020   Attention deficit hyperactivity disorder (ADHD), 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   Constipation 05/20/2020   S/P gastric bypass 05/12/2020   Intractable abdominal pain 04/27/2020   Benign intracranial hypertension 02/14/2020   Myopia of both eyes 02/14/2020   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   BMI 45.0-49.9, adult ([place]) 11/29/2019   Other intervertebral disc degeneration, lumbar region 09/19/2019   Complex cyst of left ovary  02/01/2019   Sarcoidosis 02/01/2019   Leg pain 12/07/2018   Low libido 11/27/2018   Prolonged capillary refill time 10/04/2018   Decreased pedal pulses 10/04/2018   Spondylosis 09/15/2018   Chronic low back pain with sciatica 09/15/2018   Impaired fasting blood sugar 09/11/2018   Weight gain 09/11/2018   Mild sleep apnea 08/03/2018   Hypokalemia 06/28/2018   Medication side effect 06/22/2018   Acute low back pain without sciatica 06/22/2018   Personal history of sarcoidosis 05/26/2018   Vitamin D deficiency 05/26/2018   Encounter for health maintenance examination with abnormal findings 05/26/2018   Depression, major, single episode, moderate ([place]) 05/26/2018   Daytime sleepiness 05/26/2018   Chest pain of uncertain etiology 72/53/6644   GERD (gastroesophageal reflux disease) 05/26/2018   Essential hypertension, benign 11/15/2016   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/10/2016   Morbid (severe) obesity due to excess calories ([place]) 10/23/2016   Common migraine with intractable migraine 10/07/2016   DOE (dyspnea on exertion) 04/15/2016   Hilar adenopathy 04/15/2016   Seizures ([place]) 03/11/2016   Anemia 11/12/2014   Iron deficiency 11/12/2014    Past Surgical History:  Procedure Laterality Date   ABDOMINAL HYSTERECTOMY     ESOPHAGEAL MANOMETRY N/A 09/05/2019   Procedure: ESOPHAGEAL MANOMETRY (EM);  Surgeon: Lavena Bullion, DO;  Location: [facility];  Service: Gastroenterology;  Laterality: N/A;   LAPAROSCOPIC OVARIAN CYSTECTOMY Left 03/11/2016   Procedure: LAPAROSCOPIC OVARIAN CYSTECTOMY;  Surgeon: Eldred Manges, MD;  Location: [facility];  Service: Gynecology;  Laterality: Left;   LAPAROSCOPIC VAGINAL HYSTERECTOMY WITH SALPINGECTOMY Bilateral 03/11/2016   Procedure: LAPAROSCOPIC ASSISTED VAGINAL HYSTERECTOMY WITH SALPINGECTOMY;  Surgeon: Eldred Manges, MD;  Location: [facility];  Service: Gynecology;  Laterality: Bilateral;   [place] IMPEDANCE STUDY  09/05/2019   Procedure: [place] IMPEDANCE  STUDY;  Surgeon: Lavena Bullion, DO;  Location: [facility];  Service: Gastroenterology;;   TUBAL LIGATION     UPPER GASTROINTESTINAL ENDOSCOPY  10/11/2019   06/2019    OB History     Gravida  1   Para      Term      Preterm      AB      Living         SAB      IAB      Ectopic      Multiple      Live Births               Home Medications    Prior to Admission medications   Medication Sig Start Date End Date Taking? Authorizing Provider  ACETAMINOPHEN EXTRA STRENGTH 500 MG tablet Take 1,000 mg by  mouth every 6 (six) hours as needed for pain. 04/08/20   [provider]  albuterol (PROVENTIL) (2.5 MG/3ML) 0.083% nebulizer solution Take 2.5 mg by nebulization every 6 (six) hours as needed for wheezing or shortness of breath.    [provider]  albuterol (VENTOLIN HFA) 108 (90 Base) MCG/ACT inhaler Inhale 1-2 puffs into the lungs every 4 (four) hours as needed for wheezing or shortness of breath. 01/23/21   Parrett, Fonnie Mu, NP  ALPRAZolam (XANAX) 0.25 MG tablet Take 1 tablet (0.25 mg total) by mouth 2 (two) times daily as needed for anxiety. 10/03/20   Salley Slaughter, NP  amphetamine-dextroamphetamine (ADDERALL XR) 20 MG 24 hr capsule Take 1 capsule (20 mg total) by mouth as needed. 12/17/20   Nwoko, Terese Door, PA  beclomethasone (QVAR) 80 MCG/ACT inhaler Inhale 2 puffs into the lungs 2 (two) times daily. 01/23/21   Parrett, Fonnie Mu, NP  benzonatate (TESSALON) 100 MG capsule Take 1 capsule (100 mg total) by mouth every 8 (eight) hours. 05/12/21   Faustino Congress, NP  brompheniramine-pseudoephedrine-DM 30-2-10 MG/5ML syrup Take 5 mLs by mouth 4 (four) times daily as needed. 05/07/21   Vanessa Kick, MD  dextromethorphan-guaiFENesin [facility] DM) 30-600 MG 12hr tablet Take 1 tablet by mouth 2 (two) times daily. 01/01/21   Hazel Sams, PA-C  diphenoxylate-atropine (LOMOTIL) 2.5-0.025 MG tablet Take 1-2 tablets by mouth 4 (four) times daily as  needed for diarrhea or loose stools. 05/11/21   Scot Jun, FNP  famotidine (PEPCID) 20 MG tablet Take 1 tablet (20 mg total) by mouth 2 (two) times daily. 02/10/21   Varney Biles, MD  ferrous sulfate (FER-IN-SOL) 75 (15 Fe) MG/ML SOLN Take 0.8 mLs (60 mg total) by mouth 2 (two) times daily. With food 07/22/20 08/21/20  Esterwood, Amy S, PA-C  GAVILAX 17 GM/SCOOP powder Take 17 g by mouth daily. 05/20/20   [provider]  metFORMIN (GLUCOPHAGE) 500 MG tablet Take 500 mg by mouth 2 (two) times daily with a meal.    [provider]  metoCLOPramide (REGLAN) 10 MG tablet Take 1 tablet (10 mg total) by mouth 3 (three) times daily before meals. 02/12/21   Maudie Flakes, MD  ondansetron (ZOFRAN) 4 MG tablet Take 4 mg by mouth every 8 (eight) hours as needed. 05/13/20   [provider]  oxyCODONE-acetaminophen (PERCOCET/ROXICET) 5-325 MG tablet Take 2 tablets by mouth every 12 (twelve) hours as needed for severe pain. 02/10/21   Varney Biles, MD  pantoprazole (PROTONIX) 40 MG tablet Take 1 tablet (40 mg total) by mouth 2 (two) times daily. 10/01/21 10/31/21  LampteyMyrene Galas, MD  potassium chloride SA (KLOR-CON) 20 MEQ tablet Take 1 tablet (20 mEq total) by mouth 2 (two) times daily. 07/23/20   Esterwood, Amy S, PA-C  promethazine (PROMETHEGAN) 12.5 MG suppository Place 1 suppository (12.5 mg total) rectally every 6 (six) hours as needed for nausea or vomiting. 08/16/20   Lavonia Drafts, MD  sucralfate (CARAFATE) 1 g tablet Take 1 tablet (1 g total) by mouth 4 (four) times daily -  with meals and at bedtime. 10/01/21   Desere Gwin, Myrene Galas, MD  SUMAtriptan (IMITREX) 100 MG tablet TAKE 1 TABLET BY MOUTH 2X DAILY AS NEEDED FOR MIGRAINE. MAY REPEAT IN 2 HOURS IF HEADACHE PERSISTS OR RECURS. 12/11/19   Kathrynn Ducking, MD  traMADol (ULTRAM) 50 MG tablet Take 50 mg by mouth every 6 (six) hours as needed for moderate pain.  03/27/20  [provider]  traZODone (DESYREL) 50 MG  tablet Take 1 tablet (50 mg total) by mouth at bedtime as needed for sleep. 10/03/20   Salley Slaughter, NP  chlorthalidone (HYGROTON) 25 MG tablet TAKE 1 TABLET BY MOUTH EVERY DAY Patient taking differently: Take 25 mg by mouth daily.  11/05/19 01/01/21  Ngetich, Nelda Bucks, NP    Family History Family History  Adopted: Yes  Problem Relation Age of Onset   Other Son        Growing pains   Asthma Mother    Heart Problems Mother    Migraines Mother    COPD Mother    Heart attack Mother    Diabetes Father    Peptic Ulcer Father    Heart Problems Father    COPD Father    Heart attack Father    Colon cancer Neg Hx    Colon polyps Neg Hx    Esophageal cancer Neg Hx    Rectal cancer Neg Hx    Stomach cancer Neg Hx     Social History Social History   Tobacco Use   Smoking status: Former    Packs/day: 0.25    Years: 17.00    Pack years: 4.25    Types: Cigarettes    Quit date: 03/02/2013    Years since quitting: 8.5   Smokeless tobacco: Never  Vaping Use   Vaping Use: Never used  Substance Use Topics   Alcohol use: No    Alcohol/week: 0.0 standard drinks   Drug use: Not Currently    Types: Marijuana    Comment: former - 6 yrs ago     Allergies   Aspirin   Review of Systems Review of Systems  Constitutional:  Positive for fatigue. Negative for chills and fever.  HENT:  Positive for sore throat.   Cardiovascular:  Negative for chest pain.  Gastrointestinal:  Positive for abdominal pain and diarrhea. Negative for blood in stool, nausea and vomiting.  Genitourinary: Negative.   Neurological:  Positive for weakness and headaches.    Physical Exam Triage Vital Signs ED Triage Vitals  Enc Vitals Group     BP 10/01/21 0852 135/87     Pulse Rate 10/01/21 0852 79     Resp 10/01/21 0852 18     Temp 10/01/21 0852 99 F (37.2 C)     Temp Source 10/01/21 0852 Oral     SpO2 10/01/21 0852 96 %     Weight --      Height --      Head Circumference --      Peak Flow --       Pain Score 10/01/21 0853 10     Pain Loc --      Pain Edu? --      Excl. in [place]? --    No data found.  Updated Vital Signs BP 135/87 (BP Location: Right Arm)   Pulse 79   Temp 99 F (37.2 C) (Oral)   Resp 18   LMP 02/19/2016   SpO2 96%   Visual Acuity Right Eye Distance:   Left Eye Distance:   Bilateral Distance:    Right Eye Near:   Left Eye Near:    Bilateral Near:     Physical Exam Vitals and nursing note reviewed.  HENT:     Right Ear: Tympanic membrane normal.     Left Ear: Tympanic membrane normal.  Cardiovascular:     Rate and Rhythm: Normal rate and regular  rhythm.     Pulses: Normal pulses.     Heart sounds: Normal heart sounds.  Pulmonary:     Effort: Pulmonary effort is normal.     Breath sounds: Normal breath sounds.  Abdominal:     General: Bowel sounds are normal.     Palpations: Abdomen is soft.     Tenderness: There is abdominal tenderness. There is no guarding or rebound.     Comments: Tenderness mainly in the epigastric region as well as the left lateral region.  No guarding or rebound tenderness.     UC Treatments / Results  Labs (all labs ordered are listed, but only abnormal results are displayed) Labs Reviewed  NOVEL CORONAVIRUS, NAA  CBC WITH DIFFERENTIAL/PLATELET  BASIC METABOLIC PANEL    EKG   Radiology No results found.  Procedures Procedures (including critical care time)  Medications Ordered in UC Medications  SUMAtriptan (IMITREX) injection 6 mg (6 mg Subcutaneous Not Given 10/01/21 0940)  alum & mag hydroxide-simeth (MAALOX/MYLANTA) 200-200-20 MG/5ML suspension 30 mL (30 mLs Oral Given 10/01/21 0938)    And  lidocaine (XYLOCAINE) 2 % viscous mouth solution 15 mL (15 mLs Oral Given 10/01/21 0938)  dexamethasone (DECADRON) injection 10 mg (10 mg Intramuscular Given 10/01/21 0939)    Initial Impression / Assessment and Plan / UC Course  I have reviewed the triage vital signs and the nursing notes.  Pertinent labs &  imaging results that were available during my care of the patient were reviewed by me and considered in my medical decision making (see chart for details).     1.  Acute viral gastroenteritis: COVID-19 PCR test has been sent CBC, BMP. Increase oral fluid intake If symptoms worsen please return to the urgent care to be reevaluated.  2.  Acute migraine: Protonix 40 mg twice daily Carafate 1 g before meals If symptoms worsen please return to urgent care to be reevaluated We will call you with recommendations if labs are abnormal. Final Clinical Impressions(s) / UC Diagnoses   Final diagnoses:  Viral gastroenteritis  Acute migraine     Discharge Instructions      Increase oral fluid intake Take medications as prescribed We will call you with recommendations if labs are abnormal If you have worsening symptoms please return to urgent care to be reevaluated.   ED Prescriptions     Medication Sig Dispense Auth. Provider   pantoprazole (PROTONIX) 40 MG tablet Take 1 tablet (40 mg total) by mouth 2 (two) times daily. 60 tablet Drayden Lukas, Myrene Galas, MD   sucralfate (CARAFATE) 1 g tablet Take 1 tablet (1 g total) by mouth 4 (four) times daily -  with meals and at bedtime. 60 tablet Delayla Hoffmaster, Myrene Galas, MD      PDMP not reviewed this encounter.   Chase Picket, MD 10/01/21 1003

## 2021-10-02 LAB — CBC WITH DIFFERENTIAL/PLATELET
Basophils Absolute: 0 10*3/uL (ref 0.0–0.2)
Basos: 0 %
EOS (ABSOLUTE): 0.1 10*3/uL (ref 0.0–0.4)
Eos: 4 %
Hematocrit: 35.5 % (ref 34.0–46.6)
Hemoglobin: 11.8 g/dL (ref 11.1–15.9)
Immature Grans (Abs): 0 10*3/uL (ref 0.0–0.1)
Immature Granulocytes: 0 %
Lymphocytes Absolute: 0.9 10*3/uL (ref 0.7–3.1)
Lymphs: 28 %
MCH: 29 pg (ref 26.6–33.0)
MCHC: 33.2 g/dL (ref 31.5–35.7)
MCV: 87 fL (ref 79–97)
Monocytes Absolute: 0.4 10*3/uL (ref 0.1–0.9)
Monocytes: 12 %
Neutrophils Absolute: 1.8 10*3/uL (ref 1.4–7.0)
Neutrophils: 56 %
Platelets: 152 10*3/uL (ref 150–450)
RBC: 4.07 x10E6/uL (ref 3.77–5.28)
RDW: 12.1 % (ref 11.7–15.4)
WBC: 3.2 10*3/uL — ABNORMAL LOW (ref 3.4–10.8)

## 2021-10-02 LAB — BASIC METABOLIC PANEL
BUN/Creatinine Ratio: 10 (ref 9–23)
BUN: 9 mg/dL (ref 6–24)
CO2: 20 mmol/L (ref 20–29)
Calcium: 8.5 mg/dL — ABNORMAL LOW (ref 8.7–10.2)
Chloride: 105 mmol/L (ref 96–106)
Creatinine, Ser: 0.9 mg/dL (ref 0.57–1.00)
Glucose: 74 mg/dL (ref 70–99)
Potassium: 3.6 mmol/L (ref 3.5–5.2)
Sodium: 140 mmol/L (ref 134–144)
eGFR: 79 mL/min/{1.73_m2} (ref >59)

## 2021-10-02 LAB — NOVEL CORONAVIRUS, NAA: SARS-CoV-2, NAA: NOT DETECTED

## 2021-10-02 LAB — SARS-COV-2, NAA 2 DAY TAT

## 2021-10-21 ENCOUNTER — Ambulatory Visit: Payer: Self-pay

## 2021-11-04 ENCOUNTER — Ambulatory Visit: Payer: 59

## 2021-11-08 ENCOUNTER — Other Ambulatory Visit: Payer: Self-pay

## 2021-11-08 ENCOUNTER — Emergency Department (HOSPITAL_COMMUNITY)
Admission: EM | Admit: 2021-11-08 | Discharge: 2021-11-08 | Disposition: A | Discharge status: Home / Self Care | Payer: 59 | Attending: Emergency Medicine | Admitting: Emergency Medicine

## 2021-11-08 DIAGNOSIS — Z87891 Personal history of nicotine dependence: Secondary | ICD-10-CM | POA: Diagnosis not present

## 2021-11-08 DIAGNOSIS — K0889 Other specified disorders of teeth and supporting structures: Secondary | ICD-10-CM | POA: Insufficient documentation

## 2021-11-08 DIAGNOSIS — E1122 Type 2 diabetes mellitus with diabetic chronic kidney disease: Secondary | ICD-10-CM | POA: Insufficient documentation

## 2021-11-08 DIAGNOSIS — R519 Headache, unspecified: Secondary | ICD-10-CM | POA: Diagnosis present

## 2021-11-08 DIAGNOSIS — Z8616 Personal history of COVID-19: Secondary | ICD-10-CM | POA: Insufficient documentation

## 2021-11-08 DIAGNOSIS — Z7984 Long term (current) use of oral hypoglycemic drugs: Secondary | ICD-10-CM | POA: Diagnosis not present

## 2021-11-08 DIAGNOSIS — I129 Hypertensive chronic kidney disease with stage 1 through stage 4 chronic kidney disease, or unspecified chronic kidney disease: Secondary | ICD-10-CM | POA: Insufficient documentation

## 2021-11-08 DIAGNOSIS — Z7951 Long term (current) use of inhaled steroids: Secondary | ICD-10-CM | POA: Insufficient documentation

## 2021-11-08 DIAGNOSIS — N189 Chronic kidney disease, unspecified: Secondary | ICD-10-CM | POA: Insufficient documentation

## 2021-11-08 DIAGNOSIS — G43809 Other migraine, not intractable, without status migrainosus: Secondary | ICD-10-CM | POA: Insufficient documentation

## 2021-11-08 DIAGNOSIS — J45991 Cough variant asthma: Secondary | ICD-10-CM | POA: Insufficient documentation

## 2021-11-08 LAB — CBC WITH DIFFERENTIAL/PLATELET
Abs Immature Granulocytes: 0 10*3/uL (ref 0.00–0.07)
Basophils Absolute: 0 10*3/uL (ref 0.0–0.1)
Basophils Relative: 1 %
Eosinophils Absolute: 0.2 10*3/uL (ref 0.0–0.5)
Eosinophils Relative: 5 %
HCT: 37.7 % (ref 36.0–46.0)
Hemoglobin: 12.5 g/dL (ref 12.0–15.0)
Immature Granulocytes: 0 %
Lymphocytes Relative: 33 %
Lymphs Abs: 1.3 10*3/uL (ref 0.7–4.0)
MCH: 29.8 pg (ref 26.0–34.0)
MCHC: 33.2 g/dL (ref 30.0–36.0)
MCV: 90 fL (ref 80.0–100.0)
Monocytes Absolute: 0.3 10*3/uL (ref 0.1–1.0)
Monocytes Relative: 8 %
Neutro Abs: 2.2 10*3/uL (ref 1.7–7.7)
Neutrophils Relative %: 53 %
Platelets: 169 10*3/uL (ref 150–400)
RBC: 4.19 MIL/uL (ref 3.87–5.11)
RDW: 11.9 % (ref 11.5–15.5)
WBC: 3.9 10*3/uL — ABNORMAL LOW (ref 4.0–10.5)
nRBC: 0 % (ref 0.0–0.2)

## 2021-11-08 LAB — BASIC METABOLIC PANEL
Anion gap: 5 (ref 5–15)
BUN: 12 mg/dL (ref 6–20)
CO2: 26 mmol/L (ref 22–32)
Calcium: 8.6 mg/dL — ABNORMAL LOW (ref 8.9–10.3)
Chloride: 107 mmol/L (ref 98–111)
Creatinine, Ser: 0.88 mg/dL (ref 0.44–1.00)
GFR, Estimated: >60 mL/min (ref >60)
Glucose, Bld: 93 mg/dL (ref 70–99)
Potassium: 3.9 mmol/L (ref 3.5–5.1)
Sodium: 138 mmol/L (ref 135–145)

## 2021-11-08 MED ORDER — SODIUM CHLORIDE 0.9 % IV BOLUS
1000.0000 mL | Freq: Once | INTRAVENOUS | Status: AC
  Administered 2021-11-08: 1000 mL via INTRAVENOUS

## 2021-11-08 MED ORDER — PROCHLORPERAZINE EDISYLATE 10 MG/2ML IJ SOLN
10.0000 mg | Freq: Once | INTRAMUSCULAR | Status: AC
  Administered 2021-11-08: 10 mg via INTRAVENOUS
  Filled 2021-11-08: qty 2

## 2021-11-08 MED ORDER — KETOROLAC TROMETHAMINE 15 MG/ML IJ SOLN
15.0000 mg | Freq: Once | INTRAMUSCULAR | Status: AC
  Administered 2021-11-08: 15 mg via INTRAVENOUS
  Filled 2021-11-08: qty 1

## 2021-11-08 MED ORDER — DIPHENHYDRAMINE HCL 50 MG/ML IJ SOLN
50.0000 mg | Freq: Once | INTRAMUSCULAR | Status: AC
  Administered 2021-11-08: 50 mg via INTRAVENOUS
  Filled 2021-11-08: qty 1

## 2021-11-08 NOTE — ED Triage Notes (Signed)
Pt arrived to triage complaining of dental pain and headache. States that her dentist put her on antibiotics for a tooth infection, but they have not been helping. Is supposed to get wisdom teeth out Dec 7th.   10/10 pain, OTC medications have not helped with pain  Pt state that she had a fever yesterday and has been nauseous

## 2021-11-08 NOTE — ED Provider Notes (Signed)
[facility] EMERGENCY DEPARTMENT Provider Note   CSN: [id number] Arrival date & time: 11/08/21  0757     History Chief Complaint  Patient presents with   Dental Pain   Migraine    Victoria Moore is a 45 y.o. female.  Patient presenting chief complaint of migraine headache.  Describes it as diffusely across her head aching and persistent for the past 3 weeks.  No significant change today but the pain has not gone away with her typical migraine cocktails.  She has a history of migraines in the past, followed by neurology but her oral medications have not been working well for this episode.  Also complaining of some dental pain she is seeing her dentist's and have been prescribed amoxicillin which she is still taking.  No reports of fevers or cough or vomiting or diarrhea.      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 Fibromyalgia, doubt sarcoid    Asthma    Blood transfusion without reported diagnosis    Chest wall pain 05/26/2018   Chronic kidney disease    Chronic low back pain with sciatica 09/15/2018   Common migraine with intractable migraine 10/07/2016   Cough variant asthma vs UACS  10/23/2016   - Spirometry 04/15/2016  Very truncated exp loop effort dep portion only  - Allergy profile 10/22/2016 >  Eos 0.2/  IgE  52 RAST POS grass/trees/ragweed  10/22/2016  After extensive coaching HFA effectiveness =    75% try duelra 100 2bid > improved   Daytime sleepiness 05/26/2018   Decreased pedal pulses 10/04/2018   Depression    Depression, major, single episode, moderate ([place]) 05/26/2018   Diabetes ([place])    Dyspnea 04/15/2016   04/15/2016  Walked RA x 3 laps @ 185 ft each stopped due to  End of study, nl pace, no sob or desat    - Spirometry 04/15/2016  Very truncated exp loop effort dep  portion only  - 10/22/2016  Walked RA x 3 laps @ 185 ft each stopped due to  End of study, slow pace, min sob/ no desat - full pfts rec 10/22/2016 >>>       Edema    Essential hypertension, benign 11/15/2016   Fibroids 03/11/2016   Frequency of urination 09/11/2018   GERD (gastroesophageal reflux disease)    Herniated lumbar intervertebral disc    Hiatal hernia    Hilar adenopathy    Hypertension    Hypokalemia 06/28/2018   Impaired fasting blood sugar 09/11/2018   Iron deficiency    Leg pain 12/07/2018   Low libido 11/27/2018   Migraine    Mild sleep apnea 08/03/2018   HST 06/20/18  AHI  8.1 / snoring with 02 nadir 80% >  08/03/2018 rec sleep medicine consultation    Morbid (severe) obesity due to excess calories ([place]) 10/23/2016   Personal history of sarcoidosis 05/26/2018   Prolonged capillary refill time    Right leg swelling 08/18/2018   Sarcoidosis    personal history of   Seizures ([place])    history of    Sleep apnea    Spondylosis    Vitamin D deficiency     Patient Active Problem List   Diagnosis Date  Noted   Asthmatic bronchitis 01/23/2021   COVID-19 virus infection 01/23/2021   Social anxiety disorder 10/03/2020   Attention deficit hyperactivity disorder (ADHD), 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/03/2020   Constipation 05/20/2020   S/P gastric bypass 05/12/2020   Intractable abdominal pain 04/27/2020   Benign intracranial hypertension 02/14/2020   Myopia of both eyes 02/14/2020   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   BMI 45.0-49.9, adult ([place]) 11/29/2019   Other intervertebral disc degeneration, lumbar region 09/19/2019   Complex cyst of left ovary 02/01/2019   Sarcoidosis 02/01/2019   Leg pain 12/07/2018   Low libido 11/27/2018   Prolonged capillary refill time 10/04/2018   Decreased pedal pulses 10/04/2018   Spondylosis 09/15/2018   Chronic low back pain with sciatica 09/15/2018   Impaired  fasting blood sugar 09/11/2018   Weight gain 09/11/2018   Mild sleep apnea 08/03/2018   Hypokalemia 06/28/2018   Medication side effect 06/22/2018   Acute low back pain without sciatica 06/22/2018   Personal history of sarcoidosis 05/26/2018   Vitamin D deficiency 05/26/2018   Encounter for health maintenance examination with abnormal findings 05/26/2018   Depression, major, single episode, moderate ([place]) 05/26/2018   Daytime sleepiness 05/26/2018   Chest pain of uncertain etiology 37/90/2409   GERD (gastroesophageal reflux disease) 05/26/2018   Essential hypertension, benign 11/15/2016   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/10/2016   Morbid (severe) obesity due to excess calories ([place]) 10/23/2016   Common migraine with intractable migraine 10/07/2016   DOE (dyspnea on exertion) 04/15/2016   Hilar adenopathy 04/15/2016   Seizures ([place]) 03/11/2016   Anemia 11/12/2014   Iron deficiency 11/12/2014    Past Surgical History:  Procedure Laterality Date   ABDOMINAL HYSTERECTOMY     ESOPHAGEAL MANOMETRY N/A 09/05/2019   Procedure: ESOPHAGEAL MANOMETRY (EM);  Surgeon: Lavena Bullion, DO;  Location: [facility];  Service: Gastroenterology;  Laterality: N/A;   LAPAROSCOPIC OVARIAN CYSTECTOMY Left 03/11/2016   Procedure: LAPAROSCOPIC OVARIAN CYSTECTOMY;  Surgeon: Eldred Manges, MD;  Location: [facility];  Service: Gynecology;  Laterality: Left;   LAPAROSCOPIC VAGINAL HYSTERECTOMY WITH SALPINGECTOMY Bilateral 03/11/2016   Procedure: LAPAROSCOPIC ASSISTED VAGINAL HYSTERECTOMY WITH SALPINGECTOMY;  Surgeon: Eldred Manges, MD;  Location: [facility];  Service: Gynecology;  Laterality: Bilateral;   [place] IMPEDANCE STUDY  09/05/2019   Procedure: [place] IMPEDANCE STUDY;  Surgeon: Lavena Bullion, DO;  Location: [facility];  Service: Gastroenterology;;   TUBAL LIGATION     UPPER GASTROINTESTINAL ENDOSCOPY  10/11/2019   06/2019     OB History     Gravida  1   Para      Term      Preterm      AB       Living         SAB      IAB      Ectopic      Multiple      Live Births              Family History  Adopted: Yes  Problem Relation Age of Onset   Other Son        Growing pains   Asthma Mother    Heart Problems Mother    Migraines Mother    COPD Mother    Heart attack Mother    Diabetes Father    Peptic Ulcer Father    Heart Problems Father    COPD Father  Heart attack Father    Colon cancer Neg Hx    Colon polyps Neg Hx    Esophageal cancer Neg Hx    Rectal cancer Neg Hx    Stomach cancer Neg Hx     Social History   Tobacco Use   Smoking status: Former    Packs/day: 0.25    Years: 17.00    Pack years: 4.25    Types: Cigarettes    Quit date: 03/02/2013    Years since quitting: 8.6   Smokeless tobacco: Never  Vaping Use   Vaping Use: Never used  Substance Use Topics   Alcohol use: No    Alcohol/week: 0.0 standard drinks   Drug use: Not Currently    Types: Marijuana    Comment: former - 6 yrs ago    Home Medications Prior to Admission medications   Medication Sig Start Date End Date Taking? Authorizing Provider  ACETAMINOPHEN EXTRA STRENGTH 500 MG tablet Take 1,000 mg by mouth every 6 (six) hours as needed for pain. 04/08/20   [provider]  albuterol (PROVENTIL) (2.5 MG/3ML) 0.083% nebulizer solution Take 2.5 mg by nebulization every 6 (six) hours as needed for wheezing or shortness of breath.    [provider]  albuterol (VENTOLIN HFA) 108 (90 Base) MCG/ACT inhaler Inhale 1-2 puffs into the lungs every 4 (four) hours as needed for wheezing or shortness of breath. 01/23/21   Parrett, Fonnie Mu, NP  ALPRAZolam (XANAX) 0.25 MG tablet Take 1 tablet (0.25 mg total) by mouth 2 (two) times daily as needed for anxiety. 10/03/20   Salley Slaughter, NP  amphetamine-dextroamphetamine (ADDERALL XR) 20 MG 24 hr capsule Take 1 capsule (20 mg total) by mouth as needed. 12/17/20   Nwoko, Terese Door, PA  beclomethasone (QVAR) 80 MCG/ACT inhaler  Inhale 2 puffs into the lungs 2 (two) times daily. 01/23/21   Parrett, Fonnie Mu, NP  benzonatate (TESSALON) 100 MG capsule Take 1 capsule (100 mg total) by mouth every 8 (eight) hours. 05/12/21   Faustino Congress, NP  brompheniramine-pseudoephedrine-DM 30-2-10 MG/5ML syrup Take 5 mLs by mouth 4 (four) times daily as needed. 05/07/21   Vanessa Kick, MD  dextromethorphan-guaiFENesin [facility] DM) 30-600 MG 12hr tablet Take 1 tablet by mouth 2 (two) times daily. 01/01/21   Hazel Sams, PA-C  diphenoxylate-atropine (LOMOTIL) 2.5-0.025 MG tablet Take 1-2 tablets by mouth 4 (four) times daily as needed for diarrhea or loose stools. 05/11/21   Scot Jun, FNP  famotidine (PEPCID) 20 MG tablet Take 1 tablet (20 mg total) by mouth 2 (two) times daily. 02/10/21   Varney Biles, MD  ferrous sulfate (FER-IN-SOL) 75 (15 Fe) MG/ML SOLN Take 0.8 mLs (60 mg total) by mouth 2 (two) times daily. With food 07/22/20 08/21/20  Esterwood, Amy S, PA-C  GAVILAX 17 GM/SCOOP powder Take 17 g by mouth daily. 05/20/20   [provider]  metFORMIN (GLUCOPHAGE) 500 MG tablet Take 500 mg by mouth 2 (two) times daily with a meal.    [provider]  metoCLOPramide (REGLAN) 10 MG tablet Take 1 tablet (10 mg total) by mouth 3 (three) times daily before meals. 02/12/21   Maudie Flakes, MD  ondansetron (ZOFRAN) 4 MG tablet Take 4 mg by mouth every 8 (eight) hours as needed. 05/13/20   [provider]  oxyCODONE-acetaminophen (PERCOCET/ROXICET) 5-325 MG tablet Take 2 tablets by mouth every 12 (twelve) hours as needed for severe pain. 02/10/21   Varney Biles, MD  pantoprazole ([place])  40 MG tablet Take 1 tablet (40 mg total) by mouth 2 (two) times daily. 10/01/21 10/31/21  LampteyMyrene Galas, MD  potassium chloride SA (KLOR-CON) 20 MEQ tablet Take 1 tablet (20 mEq total) by mouth 2 (two) times daily. 07/23/20   Esterwood, Amy S, PA-C  promethazine (PROMETHEGAN) 12.5 MG suppository Place 1 suppository (12.5  mg total) rectally every 6 (six) hours as needed for nausea or vomiting. 08/16/20   Lavonia Drafts, MD  sucralfate (CARAFATE) 1 g tablet Take 1 tablet (1 g total) by mouth 4 (four) times daily -  with meals and at bedtime. 10/01/21   Lamptey, Myrene Galas, MD  SUMAtriptan (IMITREX) 100 MG tablet TAKE 1 TABLET BY MOUTH 2X DAILY AS NEEDED FOR MIGRAINE. MAY REPEAT IN 2 HOURS IF HEADACHE PERSISTS OR RECURS. 12/11/19   Kathrynn Ducking, MD  traMADol (ULTRAM) 50 MG tablet Take 50 mg by mouth every 6 (six) hours as needed for moderate pain.  03/27/20   [provider]  traZODone (DESYREL) 50 MG tablet Take 1 tablet (50 mg total) by mouth at bedtime as needed for sleep. 10/03/20   Salley Slaughter, NP  chlorthalidone (HYGROTON) 25 MG tablet TAKE 1 TABLET BY MOUTH EVERY DAY Patient taking differently: Take 25 mg by mouth daily.  11/05/19 01/01/21  Ngetich, Nelda Bucks, NP    Allergies    Aspirin  Review of Systems   Review of Systems  Constitutional:  Negative for fever.  HENT:  Negative for ear pain.   Eyes:  Negative for pain.  Respiratory:  Negative for cough.   Cardiovascular:  Negative for chest pain.  Gastrointestinal:  Negative for abdominal pain.  Genitourinary:  Negative for flank pain.  Musculoskeletal:  Negative for back pain.  Skin:  Negative for rash.  Neurological:  Positive for headaches.   Physical Exam Updated Vital Signs BP (!) 143/91   Pulse (!) 59   Temp 98.4 F (36.9 C) (Oral)   Resp 16   Ht 6' (1.829 m)   Wt 88 kg   LMP 02/19/2016   SpO2 100%   BMI 26.31 kg/m   Physical Exam Constitutional:      General: She is not in acute distress.    Appearance: Normal appearance.  HENT:     Head: Normocephalic.     Nose: Nose normal.     Mouth/Throat:     Comments: Some tenderness to the posterior molars, however no gumline swelling or tenderness or abscess noted.  Uvula is midline. Eyes:     Extraocular Movements: Extraocular movements intact.  Cardiovascular:      Rate and Rhythm: Normal rate.  Pulmonary:     Effort: Pulmonary effort is normal.  Musculoskeletal:        General: Normal range of motion.     Cervical back: Normal range of motion.  Neurological:     General: No focal deficit present.     Mental Status: She is alert and oriented to person, place, and time. Mental status is at baseline.     Cranial Nerves: No cranial nerve deficit.     Sensory: No sensory deficit.     Motor: No weakness.     Coordination: Coordination normal.     Gait: Gait normal.    ED Results / Procedures / Treatments   Labs (all labs ordered are listed, but only abnormal results are displayed) Labs Reviewed  BASIC METABOLIC PANEL - Abnormal; Notable for the following components:  Result Value   Calcium 8.6 (*)    All other components within normal limits  CBC WITH DIFFERENTIAL/PLATELET - Abnormal; Notable for the following components:   WBC 3.9 (*)    All other components within normal limits  CBC WITH DIFFERENTIAL/PLATELET    EKG None  Radiology No results found.  Procedures Procedures   Medications Ordered in ED Medications  prochlorperazine (COMPAZINE) injection 10 mg (10 mg Intravenous Given 11/08/21 0909)  ketorolac (TORADOL) 15 MG/ML injection 15 mg (15 mg Intravenous Given 11/08/21 0909)  diphenhydrAMINE (BENADRYL) injection 50 mg (50 mg Intravenous Given 11/08/21 0909)  sodium chloride 0.9 % bolus 1,000 mL (1,000 mLs Intravenous New Bag/Given 11/08/21 0908)    ED Course  I have reviewed the triage vital signs and the nursing notes.  Pertinent labs & imaging results that were available during my care of the patient were reviewed by me and considered in my medical decision making (see chart for details).    MDM Rules/Calculators/A&P                           Labs unremarkable white count normal.  Vital signs within normal limits.  Patient given Compazine Toradol Benadryl with subsequent resolution of headache.  Advising  outpatient follow-up with neurology within the week, continued follow-up with her dentist recommended as well.  Recommending immediate return for worsening symptoms fevers or additional concerns.  Final Clinical Impression(s) / ED Diagnoses Final diagnoses:  Other migraine without status migrainosus, not intractable  Pain, dental    Rx / DC Orders ED Discharge Orders     None        Luna Fuse, MD 11/08/21 1038

## 2021-11-08 NOTE — Discharge Instructions (Addendum)
Call your primary care doctor or specialist as discussed in the next 2-3 days.   Return immediately back to the ER if:  Your symptoms worsen within the next 12-24 hours. You develop new symptoms such as new fevers, persistent vomiting, new pain, shortness of breath, or new weakness or numbness, or if you have any other concerns.  

## 2021-11-18 ENCOUNTER — Emergency Department
Admission: EM | Admit: 2021-11-18 | Discharge: 2021-11-18 | Disposition: A | Discharge status: Home / Self Care | Payer: 59 | Attending: Emergency Medicine | Admitting: Emergency Medicine

## 2021-11-18 ENCOUNTER — Other Ambulatory Visit: Payer: Self-pay

## 2021-11-18 ENCOUNTER — Emergency Department: Payer: 59

## 2021-11-18 ENCOUNTER — Encounter: Payer: Self-pay | Admitting: Emergency Medicine

## 2021-11-18 DIAGNOSIS — E119 Type 2 diabetes mellitus without complications: Secondary | ICD-10-CM | POA: Diagnosis not present

## 2021-11-18 DIAGNOSIS — I10 Essential (primary) hypertension: Secondary | ICD-10-CM | POA: Insufficient documentation

## 2021-11-18 DIAGNOSIS — Z8616 Personal history of COVID-19: Secondary | ICD-10-CM | POA: Diagnosis not present

## 2021-11-18 DIAGNOSIS — B349 Viral infection, unspecified: Secondary | ICD-10-CM | POA: Insufficient documentation

## 2021-11-18 DIAGNOSIS — Z20822 Contact with and (suspected) exposure to covid-19: Secondary | ICD-10-CM | POA: Diagnosis not present

## 2021-11-18 DIAGNOSIS — J45909 Unspecified asthma, uncomplicated: Secondary | ICD-10-CM | POA: Diagnosis not present

## 2021-11-18 DIAGNOSIS — Z87891 Personal history of nicotine dependence: Secondary | ICD-10-CM | POA: Insufficient documentation

## 2021-11-18 DIAGNOSIS — M791 Myalgia, unspecified site: Secondary | ICD-10-CM | POA: Diagnosis present

## 2021-11-18 DIAGNOSIS — Z7951 Long term (current) use of inhaled steroids: Secondary | ICD-10-CM | POA: Diagnosis not present

## 2021-11-18 DIAGNOSIS — Z7984 Long term (current) use of oral hypoglycemic drugs: Secondary | ICD-10-CM | POA: Diagnosis not present

## 2021-11-18 LAB — URINALYSIS, COMPLETE (UACMP) WITH MICROSCOPIC
Bacteria, UA: NONE SEEN
Bilirubin Urine: NEGATIVE
Glucose, UA: NEGATIVE mg/dL
Hgb urine dipstick: NEGATIVE
Ketones, ur: NEGATIVE mg/dL
Leukocytes,Ua: NEGATIVE
Nitrite: NEGATIVE
Protein, ur: NEGATIVE mg/dL
Specific Gravity, Urine: 1.027 (ref 1.005–1.030)
pH: 5 (ref 5.0–8.0)

## 2021-11-18 LAB — RESP PANEL BY RT-PCR (FLU A&B, COVID) ARPGX2
Influenza A by PCR: NEGATIVE
Influenza B by PCR: NEGATIVE
SARS Coronavirus 2 by RT PCR: NEGATIVE

## 2021-11-18 MED ORDER — ACETAMINOPHEN 500 MG PO TABS
1000.0000 mg | ORAL_TABLET | Freq: Once | ORAL | Status: AC
  Administered 2021-11-18: 1000 mg via ORAL
  Filled 2021-11-18: qty 2

## 2021-11-18 NOTE — ED Triage Notes (Signed)
Pt to ED from home c/o body aches, fever of 101 at home, cough, chills and headache since yesterday.  Pt A&Ox4, chest rise even and unlabored, playing on phone during triage, in NAD at this time.

## 2021-11-18 NOTE — ED Provider Notes (Signed)
[facility] Emergency Department Provider Note ____________________________________________   Event Date/Time   First MD Initiated Contact with Patient 11/18/21 [phone]     (approximate)  I have reviewed the triage vital signs and the nursing notes.  HISTORY  Chief Complaint Sore Throat, Chills, and Generalized Body Aches   HPI Victoria Moore is a 45 y.o. femalewho presents to the ED for evaluation of myalgias and chills.   Chart review indicates minimal medical hx.   Patient presents to the ED for evaluation of about 24 hours of generalized myalgias, weakness and feeling poorly.  She reports chills and sweats without documented fevers.  Reports atraumatic muscular aches to her bilateral lower extremities and back.  Reports a singular episode of dysuria 2 days ago, but this is not persistent.  Denies abdominal pain, diarrhea, nausea, emesis, congestion, cough, shortness of breath, chest pain or syncope.  Reports a fullness sensation to her throat without sore throat or congestion.  Denies sick contacts.  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 Fibromyalgia, doubt sarcoid    Asthma    Blood transfusion without reported diagnosis    Chest wall pain 05/26/2018   Chronic kidney disease    Chronic low back pain with sciatica 09/15/2018   Common migraine with intractable migraine 10/07/2016   Cough variant asthma vs UACS  10/23/2016   - Spirometry 04/15/2016  Very truncated exp loop effort dep portion only  - Allergy profile 10/22/2016 >  Eos 0.2/  IgE  52 RAST POS grass/trees/ragweed  10/22/2016  After extensive coaching HFA effectiveness =    75% try duelra 100 2bid > improved   Daytime sleepiness 05/26/2018   Decreased pedal pulses 10/04/2018   Depression    Depression,  major, single episode, moderate ([place]) 05/26/2018   Diabetes ([place])    Dyspnea 04/15/2016   04/15/2016  Walked RA x 3 laps @ 185 ft each stopped due to  End of study, nl pace, no sob or desat    - Spirometry 04/15/2016  Very truncated exp loop effort dep portion only  - 10/22/2016  Walked RA x 3 laps @ 185 ft each stopped due to  End of study, slow pace, min sob/ no desat - full pfts rec 10/22/2016 >>>       Edema    Essential hypertension, benign 11/15/2016   Fibroids 03/11/2016   Frequency of urination 09/11/2018   GERD (gastroesophageal reflux disease)    Herniated lumbar intervertebral disc    Hiatal hernia    Hilar adenopathy    Hypertension    Hypokalemia 06/28/2018   Impaired fasting blood sugar 09/11/2018   Iron deficiency    Leg pain 12/07/2018   Low libido 11/27/2018   Migraine    Mild sleep apnea 08/03/2018   HST 06/20/18  AHI  8.1 / snoring with 02 nadir 80% >  08/03/2018 rec sleep medicine consultation    Morbid (severe) obesity due to excess calories ([place]) 10/23/2016   Personal history of sarcoidosis 05/26/2018   Prolonged capillary refill time    Right leg swelling 08/18/2018   Sarcoidosis    personal history of   Seizures ([place])    history of    Sleep apnea  Spondylosis    Vitamin D deficiency     Patient Active Problem List   Diagnosis Date Noted   Asthmatic bronchitis 01/23/2021   COVID-19 virus infection 01/23/2021   Social anxiety disorder 10/03/2020   Attention deficit hyperactivity disorder (ADHD), 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   Mild episode of recurrent major depressive disorder ([place]) 10/03/2020   Constipation 05/20/2020   S/P gastric bypass 05/12/2020   Intractable abdominal pain 04/27/2020   Benign intracranial hypertension 02/14/2020   Myopia of both eyes 02/14/2020   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   BMI 45.0-49.9, adult ([place]) 11/29/2019   Other intervertebral disc degeneration, lumbar region 09/19/2019   Complex cyst of left ovary  02/01/2019   Sarcoidosis 02/01/2019   Leg pain 12/07/2018   Low libido 11/27/2018   Prolonged capillary refill time 10/04/2018   Decreased pedal pulses 10/04/2018   Spondylosis 09/15/2018   Chronic low back pain with sciatica 09/15/2018   Impaired fasting blood sugar 09/11/2018   Weight gain 09/11/2018   Mild sleep apnea 08/03/2018   Hypokalemia 06/28/2018   Medication side effect 06/22/2018   Acute low back pain without sciatica 06/22/2018   Personal history of sarcoidosis 05/26/2018   Vitamin D deficiency 05/26/2018   Encounter for health maintenance examination with abnormal findings 05/26/2018   Depression, major, single episode, moderate ([place]) 05/26/2018   Daytime sleepiness 05/26/2018   Chest pain of uncertain etiology 51/88/4166   GERD (gastroesophageal reflux disease) 05/26/2018   Essential hypertension, benign 11/15/2016   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/10/2016   Morbid (severe) obesity due to excess calories ([place]) 10/23/2016   Common migraine with intractable migraine 10/07/2016   DOE (dyspnea on exertion) 04/15/2016   Hilar adenopathy 04/15/2016   Seizures ([place]) 03/11/2016   Anemia 11/12/2014   Iron deficiency 11/12/2014    Past Surgical History:  Procedure Laterality Date   ABDOMINAL HYSTERECTOMY     ESOPHAGEAL MANOMETRY N/A 09/05/2019   Procedure: ESOPHAGEAL MANOMETRY (EM);  Surgeon: Lavena Bullion, DO;  Location: [facility];  Service: Gastroenterology;  Laterality: N/A;   LAPAROSCOPIC OVARIAN CYSTECTOMY Left 03/11/2016   Procedure: LAPAROSCOPIC OVARIAN CYSTECTOMY;  Surgeon: Eldred Manges, MD;  Location: [facility];  Service: Gynecology;  Laterality: Left;   LAPAROSCOPIC VAGINAL HYSTERECTOMY WITH SALPINGECTOMY Bilateral 03/11/2016   Procedure: LAPAROSCOPIC ASSISTED VAGINAL HYSTERECTOMY WITH SALPINGECTOMY;  Surgeon: Eldred Manges, MD;  Location: [facility];  Service: Gynecology;  Laterality: Bilateral;   [place] IMPEDANCE STUDY  09/05/2019   Procedure: [place] IMPEDANCE  STUDY;  Surgeon: Lavena Bullion, DO;  Location: [facility];  Service: Gastroenterology;;   TUBAL LIGATION     UPPER GASTROINTESTINAL ENDOSCOPY  10/11/2019   06/2019    Prior to Admission medications   Medication Sig Start Date End Date Taking? Authorizing Provider  ACETAMINOPHEN EXTRA STRENGTH 500 MG tablet Take 1,000 mg by mouth every 6 (six) hours as needed for pain. 04/08/20   [provider]  albuterol (PROVENTIL) (2.5 MG/3ML) 0.083% nebulizer solution Take 2.5 mg by nebulization every 6 (six) hours as needed for wheezing or shortness of breath.    [provider]  albuterol (VENTOLIN HFA) 108 (90 Base) MCG/ACT inhaler Inhale 1-2 puffs into the lungs every 4 (four) hours as needed for wheezing or shortness of breath. 01/23/21   Parrett, Fonnie Mu, NP  ALPRAZolam (XANAX) 0.25 MG tablet Take 1 tablet (0.25 mg total) by mouth 2 (two) times daily as needed for anxiety. 10/03/20   Salley Slaughter, NP  amphetamine-dextroamphetamine (ADDERALL XR)  20 MG 24 hr capsule Take 1 capsule (20 mg total) by mouth as needed. 12/17/20   Nwoko, Terese Door, PA  beclomethasone (QVAR) 80 MCG/ACT inhaler Inhale 2 puffs into the lungs 2 (two) times daily. 01/23/21   Parrett, Fonnie Mu, NP  benzonatate (TESSALON) 100 MG capsule Take 1 capsule (100 mg total) by mouth every 8 (eight) hours. 05/12/21   Faustino Congress, NP  brompheniramine-pseudoephedrine-DM 30-2-10 MG/5ML syrup Take 5 mLs by mouth 4 (four) times daily as needed. 05/07/21   Vanessa Kick, MD  dextromethorphan-guaiFENesin [facility] DM) 30-600 MG 12hr tablet Take 1 tablet by mouth 2 (two) times daily. 01/01/21   Hazel Sams, PA-C  diphenoxylate-atropine (LOMOTIL) 2.5-0.025 MG tablet Take 1-2 tablets by mouth 4 (four) times daily as needed for diarrhea or loose stools. 05/11/21   Scot Jun, FNP  famotidine (PEPCID) 20 MG tablet Take 1 tablet (20 mg total) by mouth 2 (two) times daily. 02/10/21   Varney Biles, MD  ferrous sulfate  (FER-IN-SOL) 75 (15 Fe) MG/ML SOLN Take 0.8 mLs (60 mg total) by mouth 2 (two) times daily. With food 07/22/20 08/21/20  Esterwood, Amy S, PA-C  GAVILAX 17 GM/SCOOP powder Take 17 g by mouth daily. 05/20/20   [provider]  metFORMIN (GLUCOPHAGE) 500 MG tablet Take 500 mg by mouth 2 (two) times daily with a meal.    [provider]  metoCLOPramide (REGLAN) 10 MG tablet Take 1 tablet (10 mg total) by mouth 3 (three) times daily before meals. 02/12/21   Maudie Flakes, MD  ondansetron (ZOFRAN) 4 MG tablet Take 4 mg by mouth every 8 (eight) hours as needed. 05/13/20   [provider]  oxyCODONE-acetaminophen (PERCOCET/ROXICET) 5-325 MG tablet Take 2 tablets by mouth every 12 (twelve) hours as needed for severe pain. 02/10/21   Varney Biles, MD  pantoprazole (PROTONIX) 40 MG tablet Take 1 tablet (40 mg total) by mouth 2 (two) times daily. 10/01/21 10/31/21  LampteyMyrene Galas, MD  potassium chloride SA (KLOR-CON) 20 MEQ tablet Take 1 tablet (20 mEq total) by mouth 2 (two) times daily. 07/23/20   Esterwood, Amy S, PA-C  promethazine (PROMETHEGAN) 12.5 MG suppository Place 1 suppository (12.5 mg total) rectally every 6 (six) hours as needed for nausea or vomiting. 08/16/20   Lavonia Drafts, MD  sucralfate (CARAFATE) 1 g tablet Take 1 tablet (1 g total) by mouth 4 (four) times daily -  with meals and at bedtime. 10/01/21   Lamptey, Myrene Galas, MD  SUMAtriptan (IMITREX) 100 MG tablet TAKE 1 TABLET BY MOUTH 2X DAILY AS NEEDED FOR MIGRAINE. MAY REPEAT IN 2 HOURS IF HEADACHE PERSISTS OR RECURS. 12/11/19   Kathrynn Ducking, MD  traMADol (ULTRAM) 50 MG tablet Take 50 mg by mouth every 6 (six) hours as needed for moderate pain.  03/27/20   [provider]  traZODone (DESYREL) 50 MG tablet Take 1 tablet (50 mg total) by mouth at bedtime as needed for sleep. 10/03/20   Salley Slaughter, NP  chlorthalidone (HYGROTON) 25 MG tablet TAKE 1 TABLET BY MOUTH EVERY DAY Patient taking differently:  Take 25 mg by mouth daily.  11/05/19 01/01/21  Ngetich, Nelda Bucks, NP    Allergies Aspirin  Family History  Adopted: Yes  Problem Relation Age of Onset   Other Son        Growing pains   Asthma Mother    Heart Problems Mother    Migraines Mother    COPD Mother  Heart attack Mother    Diabetes Father    Peptic Ulcer Father    Heart Problems Father    COPD Father    Heart attack Father    Colon cancer Neg Hx    Colon polyps Neg Hx    Esophageal cancer Neg Hx    Rectal cancer Neg Hx    Stomach cancer Neg Hx     Social History Social History   Tobacco Use   Smoking status: Former    Packs/day: 0.25    Years: 17.00    Pack years: 4.25    Types: Cigarettes    Quit date: 03/02/2013    Years since quitting: 8.7   Smokeless tobacco: Never  Vaping Use   Vaping Use: Never used  Substance Use Topics   Alcohol use: No    Alcohol/week: 0.0 standard drinks   Drug use: Not Currently    Types: Marijuana    Comment: former - 6 yrs ago    Review of Systems  Constitutional: Positive for subjective fever/chills Eyes: No visual changes. ENT: Positive for throat fullness sensation Cardiovascular: Denies chest pain. Respiratory: Denies shortness of breath. Gastrointestinal: No abdominal pain.  No nausea, no vomiting.  No diarrhea.  No constipation. Genitourinary: Positive for for dysuria. Musculoskeletal: Positive for atraumatic myalgias Skin: Negative for rash. Neurological: Negative for headaches, focal weakness or numbness. ____________________________________________   PHYSICAL EXAM:  VITAL SIGNS: Vitals:   11/18/21 0545 11/18/21 0645  BP: 140/88 136/81  Pulse: 62 66  Resp: 16 16  Temp: 98.7 F (37.1 C)   SpO2: 99%      Constitutional: Alert and oriented.  Covered in blanket, appears uncomfortable.  Conversational in full sentences.  No distress. Eyes: Conjunctivae are normal. PERRL. EOMI. Head: Atraumatic. Nose: No congestion/rhinnorhea. Mouth/Throat: Mucous  membranes are moist.  Oropharynx non-erythematous. No intraoral swelling, erythema or evidence of periapical abscess. Uvula is midline and tonsils are 1+ bilaterally without exudate Neck: No stridor. No cervical spine tenderness to palpation. Cardiovascular: Normal rate, regular rhythm. Grossly normal heart sounds.  Good peripheral circulation. Respiratory: Normal respiratory effort.  No retractions. Lungs CTAB. Gastrointestinal: Soft , nondistended, nontender to palpation. No CVA tenderness. Musculoskeletal: No lower extremity tenderness nor edema.  No joint effusions. No signs of acute trauma. Neurologic:  Normal speech and language. No gross focal neurologic deficits are appreciated. No gait instability noted. Skin:  Skin is warm, dry and intact. No rash noted. Psychiatric: Mood and affect are normal. Speech and behavior are normal. ____________________________________________   LABS (all labs ordered are listed, but only abnormal results are displayed)  Labs Reviewed  URINALYSIS, COMPLETE (UACMP) WITH MICROSCOPIC - Abnormal; Notable for the following components:      Result Value   Color, Urine YELLOW (*)    APPearance HAZY (*)    All other components within normal limits  RESP PANEL BY RT-PCR (FLU A&B, COVID) [id number]   ____________________________________________  12 Lead EKG   ____________________________________________  RADIOLOGY  ED MD interpretation:  CXR reviewed by me without evidence of acute cardiopulmonary pathology.  Official radiology report(s): DG Chest 2 View  Result Date: 11/18/2021 CLINICAL DATA:  Cough with body aches and fever EXAM: CHEST - 2 VIEW COMPARISON:  05/18/2021 FINDINGS: Normal heart size and mediastinal contours. No acute infiltrate or edema. No effusion or pneumothorax. No acute osseous findings. IMPRESSION: No active cardiopulmonary disease. Electronically Signed   By: Jorje Guild M.D.   On: 11/18/2021 06:11     ____________________________________________  PROCEDURES and INTERVENTIONS  Procedure(s) performed (including Critical Care):  Procedures  Medications  acetaminophen (TYLENOL) tablet 1,000 mg (1,000 mg Oral Given 11/18/21 0720)    ____________________________________________   MDM / ED COURSE   Largely healthy 45 year old female presents to the ED with 1 day of myalgias and feeling poorly, with likely viral syndrome and amenable to outpatient management.  No evidence of pneumonia, acute cystitis, flu or COVID.  She looks clinically well without evidence of systemic or serious illness.  Will discharge with return precautions.  Clinical Course as of 11/18/21 0753  Wed Nov 18, 2021  0718 We discussed possible etiologies of her symptoms.  We discussed benign COVID/influenza testing, CXR without evidence of CAP.  Due to her dysuria, we discussed urinalysis to ensure no evidence of acute cystitis.  No evidence of sepsis or severe illness.  Anticipate viral syndrome otherwise. [DS]  4315 Reassessed and discussed UA work-up.  We discussed return precautions for the ED and management at home. [DS]    Clinical Course User Index [DS] Vladimir Crofts, MD    ____________________________________________   FINAL CLINICAL IMPRESSION(S) / ED DIAGNOSES  Final diagnoses:  Viral syndrome     ED Discharge Orders     None        Tylyn Stankovich   Note:  This document was prepared using Dragon voice recognition software and may include unintentional dictation errors.    Vladimir Crofts, MD 11/18/21 [phone]

## 2021-11-18 NOTE — Discharge Instructions (Signed)
Please take Tylenol and ibuprofen/Advil for your pain.  It is safe to take them together, or to alternate them every few hours.  Take up to 1000mg of Tylenol at a time, up to 4 times per day.  Do not take more than 4000 mg of Tylenol in 24 hours.  For ibuprofen, take 400-600 mg, 4-5 times per day. ° ° °

## 2021-11-22 ENCOUNTER — Ambulatory Visit: Payer: Self-pay

## 2021-12-16 ENCOUNTER — Other Ambulatory Visit: Payer: Self-pay

## 2021-12-16 ENCOUNTER — Ambulatory Visit: Payer: 59

## 2021-12-16 ENCOUNTER — Ambulatory Visit
Admission: RE | Admit: 2021-12-16 | Discharge: 2021-12-16 | Disposition: A | Discharge status: Home / Self Care | Payer: 59 | Source: Ambulatory Visit | Attending: Family Medicine | Admitting: Family Medicine

## 2021-12-16 VITALS — BP 119/59 | Temp 98.8°F | Resp 20

## 2021-12-16 DIAGNOSIS — R0789 Other chest pain: Secondary | ICD-10-CM

## 2021-12-16 DIAGNOSIS — F419 Anxiety disorder, unspecified: Secondary | ICD-10-CM

## 2021-12-16 DIAGNOSIS — G43009 Migraine without aura, not intractable, without status migrainosus: Secondary | ICD-10-CM

## 2021-12-16 DIAGNOSIS — F4321 Adjustment disorder with depressed mood: Secondary | ICD-10-CM | POA: Diagnosis not present

## 2021-12-16 MED ORDER — HYDROXYZINE HCL 25 MG PO TABS: 25.0000 mg | ORAL_TABLET | Freq: Four times a day (QID) | ORAL | 0 refills | Status: DC | PRN

## 2021-12-16 MED ORDER — DEXAMETHASONE SODIUM PHOSPHATE 10 MG/ML IJ SOLN
10.0000 mg | Freq: Once | INTRAMUSCULAR | Status: AC
  Administered 2021-12-16: 13:00:00 10 mg via INTRAMUSCULAR

## 2021-12-16 NOTE — ED Triage Notes (Signed)
Patient states she lost her mother on November 25, 2021. Since then she is having migranes, chest pain, pressure in chest, panic and anxiety attack are worse than normal.  Elevated Blood Pressure.

## 2021-12-16 NOTE — Discharge Instructions (Signed)
[facility] Address: [address], [place], [place] [address] Phone: [phone]

## 2021-12-16 NOTE — ED Provider Notes (Signed)
[facility]    CSN: [id number] Arrival date & time: 12/16/21  1151      History   Chief Complaint Chief Complaint  Patient presents with   Chest Pain    Chest pain and anxiety    HPI Victoria Moore is a 45 y.o. female.   Presenting today with progressively worsening anxiety, panic attacks causing chest pain, dizziness, chest pressure, fast breathing the past few weeks since the passing of her mother on 11/25/2021.  States she has a known history of significant anxiety, has been tried on numerous medications in the past but none have worked well for her so she stopped taking everything.  She does have a PCP but has not seen them since the passing of her mother.  Denies suicidal or homicidal ideation, history of heart problems, medications attempted at home.  She is also noting a migraine that is not improving with over-the-counter pain relievers, her prescription migraine regimen.  Some photophobia but no nausea, vomiting, syncope, head injury.   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 Fibromyalgia, doubt sarcoid    Asthma    Blood transfusion without reported diagnosis    Chest wall pain 05/26/2018   Chronic kidney disease    Chronic low back pain with sciatica 09/15/2018   Common migraine with intractable migraine 10/07/2016   Cough variant asthma vs UACS  10/23/2016   - Spirometry 04/15/2016  Very truncated exp loop effort dep portion only  - Allergy profile 10/22/2016 >  Eos 0.2/  IgE  52 RAST POS grass/trees/ragweed  10/22/2016  After extensive coaching HFA effectiveness =    75% try duelra 100 2bid > improved   Daytime sleepiness 05/26/2018   Decreased pedal pulses 10/04/2018   Depression    Depression, major, single episode, moderate ([place]) 05/26/2018   Diabetes ([place])     Dyspnea 04/15/2016   04/15/2016  Walked RA x 3 laps @ 185 ft each stopped due to  End of study, nl pace, no sob or desat    - Spirometry 04/15/2016  Very truncated exp loop effort dep portion only  - 10/22/2016  Walked RA x 3 laps @ 185 ft each stopped due to  End of study, slow pace, min sob/ no desat - full pfts rec 10/22/2016 >>>       Edema    Essential hypertension, benign 11/15/2016   Fibroids 03/11/2016   Frequency of urination 09/11/2018   GERD (gastroesophageal reflux disease)    Herniated lumbar intervertebral disc    Hiatal hernia    Hilar adenopathy    Hypertension    Hypokalemia 06/28/2018   Impaired fasting blood sugar 09/11/2018   Iron deficiency    Leg pain 12/07/2018   Low libido 11/27/2018   Migraine    Mild sleep apnea 08/03/2018   HST 06/20/18  AHI  8.1 / snoring with 02 nadir 80% >  08/03/2018 rec sleep medicine consultation    Morbid (severe) obesity due to excess calories ([place]) 10/23/2016   Personal history of sarcoidosis 05/26/2018   Prolonged capillary refill time    Right leg swelling 08/18/2018   Sarcoidosis    personal history of   Seizures ([place])    history of  Sleep apnea    Spondylosis    Vitamin D deficiency     Patient Active Problem List   Diagnosis Date Noted   Asthmatic bronchitis 01/23/2021   COVID-19 virus infection 01/23/2021   Social anxiety disorder 10/03/2020   Attention deficit hyperactivity disorder (ADHD), 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   Mild episode of recurrent major depressive disorder ([place]) 10/03/2020   Constipation 05/20/2020   S/P gastric bypass 05/12/2020   Intractable abdominal pain 04/27/2020   Benign intracranial hypertension 02/14/2020   Myopia of both eyes 02/14/2020   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   BMI 45.0-49.9, adult ([place]) 11/29/2019   Other intervertebral disc degeneration, lumbar region 09/19/2019   Complex cyst of left ovary 02/01/2019   Sarcoidosis 02/01/2019   Leg pain 12/07/2018   Low libido  11/27/2018   Prolonged capillary refill time 10/04/2018   Decreased pedal pulses 10/04/2018   Spondylosis 09/15/2018   Chronic low back pain with sciatica 09/15/2018   Impaired fasting blood sugar 09/11/2018   Weight gain 09/11/2018   Mild sleep apnea 08/03/2018   Hypokalemia 06/28/2018   Medication side effect 06/22/2018   Acute low back pain without sciatica 06/22/2018   Personal history of sarcoidosis 05/26/2018   Vitamin D deficiency 05/26/2018   Encounter for health maintenance examination with abnormal findings 05/26/2018   Depression, major, single episode, moderate ([place]) 05/26/2018   Daytime sleepiness 05/26/2018   Chest pain of uncertain etiology 78/29/5621   GERD (gastroesophageal reflux disease) 05/26/2018   Essential hypertension, benign 11/15/2016   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/10/2016   Morbid (severe) obesity due to excess calories ([place]) 10/23/2016   Common migraine with intractable migraine 10/07/2016   DOE (dyspnea on exertion) 04/15/2016   Hilar adenopathy 04/15/2016   Seizures ([place]) 03/11/2016   Anemia 11/12/2014   Iron deficiency 11/12/2014    Past Surgical History:  Procedure Laterality Date   ABDOMINAL HYSTERECTOMY     ESOPHAGEAL MANOMETRY N/A 09/05/2019   Procedure: ESOPHAGEAL MANOMETRY (EM);  Surgeon: Lavena Bullion, DO;  Location: [facility];  Service: Gastroenterology;  Laterality: N/A;   LAPAROSCOPIC OVARIAN CYSTECTOMY Left 03/11/2016   Procedure: LAPAROSCOPIC OVARIAN CYSTECTOMY;  Surgeon: Eldred Manges, MD;  Location: [facility];  Service: Gynecology;  Laterality: Left;   LAPAROSCOPIC VAGINAL HYSTERECTOMY WITH SALPINGECTOMY Bilateral 03/11/2016   Procedure: LAPAROSCOPIC ASSISTED VAGINAL HYSTERECTOMY WITH SALPINGECTOMY;  Surgeon: Eldred Manges, MD;  Location: [facility];  Service: Gynecology;  Laterality: Bilateral;   [place] IMPEDANCE STUDY  09/05/2019   Procedure: [place] IMPEDANCE STUDY;  Surgeon: Lavena Bullion, DO;  Location: [facility];  Service:  Gastroenterology;;   TUBAL LIGATION     UPPER GASTROINTESTINAL ENDOSCOPY  10/11/2019   06/2019    OB History     Gravida  1   Para      Term      Preterm      AB      Living         SAB      IAB      Ectopic      Multiple      Live Births               Home Medications    Prior to Admission medications   Medication Sig Start Date End Date Taking? Authorizing Provider  hydrOXYzine (ATARAX) 25 MG tablet Take 1 tablet (25 mg total) by mouth every 6 (six) hours as needed. May cause drowsiness 12/16/21  Yes Volney American, PA-C  ACETAMINOPHEN EXTRA STRENGTH 500 MG tablet Take 1,000 mg by mouth every 6 (six) hours as needed for pain. 04/08/20   [provider]  albuterol (PROVENTIL) (2.5 MG/3ML) 0.083% nebulizer solution Take 2.5 mg by nebulization every 6 (six) hours as needed for wheezing or shortness of breath.    [provider]  albuterol (VENTOLIN HFA) 108 (90 Base) MCG/ACT inhaler Inhale 1-2 puffs into the lungs every 4 (four) hours as needed for wheezing or shortness of breath. 01/23/21   Parrett, Fonnie Mu, NP  ALPRAZolam (XANAX) 0.25 MG tablet Take 1 tablet (0.25 mg total) by mouth 2 (two) times daily as needed for anxiety. 10/03/20   Salley Slaughter, NP  amphetamine-dextroamphetamine (ADDERALL XR) 20 MG 24 hr capsule Take 1 capsule (20 mg total) by mouth as needed. 12/17/20   Nwoko, Terese Door, PA  beclomethasone (QVAR) 80 MCG/ACT inhaler Inhale 2 puffs into the lungs 2 (two) times daily. 01/23/21   Parrett, Fonnie Mu, NP  benzonatate (TESSALON) 100 MG capsule Take 1 capsule (100 mg total) by mouth every 8 (eight) hours. 05/12/21   Faustino Congress, NP  brompheniramine-pseudoephedrine-DM 30-2-10 MG/5ML syrup Take 5 mLs by mouth 4 (four) times daily as needed. 05/07/21   Vanessa Kick, MD  dextromethorphan-guaiFENesin [facility] DM) 30-600 MG 12hr tablet Take 1 tablet by mouth 2 (two) times daily. 01/01/21   Hazel Sams, PA-C   diphenoxylate-atropine (LOMOTIL) 2.5-0.025 MG tablet Take 1-2 tablets by mouth 4 (four) times daily as needed for diarrhea or loose stools. 05/11/21   Scot Jun, FNP  famotidine (PEPCID) 20 MG tablet Take 1 tablet (20 mg total) by mouth 2 (two) times daily. 02/10/21   Varney Biles, MD  ferrous sulfate (FER-IN-SOL) 75 (15 Fe) MG/ML SOLN Take 0.8 mLs (60 mg total) by mouth 2 (two) times daily. With food 07/22/20 08/21/20  Esterwood, Amy S, PA-C  GAVILAX 17 GM/SCOOP powder Take 17 g by mouth daily. 05/20/20   [provider]  metFORMIN (GLUCOPHAGE) 500 MG tablet Take 500 mg by mouth 2 (two) times daily with a meal.    [provider]  metoCLOPramide (REGLAN) 10 MG tablet Take 1 tablet (10 mg total) by mouth 3 (three) times daily before meals. 02/12/21   Maudie Flakes, MD  ondansetron (ZOFRAN) 4 MG tablet Take 4 mg by mouth every 8 (eight) hours as needed. 05/13/20   [provider]  oxyCODONE-acetaminophen (PERCOCET/ROXICET) 5-325 MG tablet Take 2 tablets by mouth every 12 (twelve) hours as needed for severe pain. 02/10/21   Varney Biles, MD  pantoprazole (PROTONIX) 40 MG tablet Take 1 tablet (40 mg total) by mouth 2 (two) times daily. 10/01/21 10/31/21  LampteyMyrene Galas, MD  potassium chloride SA (KLOR-CON) 20 MEQ tablet Take 1 tablet (20 mEq total) by mouth 2 (two) times daily. 07/23/20   Esterwood, Amy S, PA-C  promethazine (PROMETHEGAN) 12.5 MG suppository Place 1 suppository (12.5 mg total) rectally every 6 (six) hours as needed for nausea or vomiting. 08/16/20   Lavonia Drafts, MD  sucralfate (CARAFATE) 1 g tablet Take 1 tablet (1 g total) by mouth 4 (four) times daily -  with meals and at bedtime. 10/01/21   Lamptey, Myrene Galas, MD  SUMAtriptan (IMITREX) 100 MG tablet TAKE 1 TABLET BY MOUTH 2X DAILY AS NEEDED FOR MIGRAINE. MAY REPEAT IN 2 HOURS IF HEADACHE PERSISTS OR RECURS. 12/11/19   Kathrynn Ducking, MD  traMADol (ULTRAM) 50 MG tablet Take 50 mg by mouth every 6  (  six) hours as needed for moderate pain.  03/27/20   [provider]  traZODone (DESYREL) 50 MG tablet Take 1 tablet (50 mg total) by mouth at bedtime as needed for sleep. 10/03/20   Salley Slaughter, NP  chlorthalidone (HYGROTON) 25 MG tablet TAKE 1 TABLET BY MOUTH EVERY DAY Patient taking differently: Take 25 mg by mouth daily.  11/05/19 01/01/21  Ngetich, Nelda Bucks, NP    Family History Family History  Adopted: Yes  Problem Relation Age of Onset   Other Son        Growing pains   Asthma Mother    Heart Problems Mother    Migraines Mother    COPD Mother    Heart attack Mother    Diabetes Father    Peptic Ulcer Father    Heart Problems Father    COPD Father    Heart attack Father    Colon cancer Neg Hx    Colon polyps Neg Hx    Esophageal cancer Neg Hx    Rectal cancer Neg Hx    Stomach cancer Neg Hx     Social History Social History   Tobacco Use   Smoking status: Former    Packs/day: 0.25    Years: 17.00    Pack years: 4.25    Types: Cigarettes    Quit date: 03/02/2013    Years since quitting: 8.7   Smokeless tobacco: Never  Vaping Use   Vaping Use: Never used  Substance Use Topics   Alcohol use: No    Alcohol/week: 0.0 standard drinks   Drug use: Not Currently    Types: Marijuana    Comment: former - 6 yrs ago     Allergies   Aspirin   Review of Systems Review of Systems Per HPI  Physical Exam Triage Vital Signs ED Triage Vitals  Enc Vitals Group     BP 12/16/21 1156 (!) 119/59     Pulse --      Resp 12/16/21 1156 20     Temp 12/16/21 1156 98.8 F (37.1 C)     Temp Source 12/16/21 1156 Oral     SpO2 12/16/21 1156 98 %     Weight --      Height --      Head Circumference --      Peak Flow --      Pain Score 12/16/21 1157 10     Pain Loc --      Pain Edu? --      Excl. in [place]? --    No data found.  Updated Vital Signs BP (!) 119/59 (BP Location: Right Arm)    Temp 98.8 F (37.1 C) (Oral)    Resp 20    LMP 02/19/2016    SpO2 98%    Visual Acuity Right Eye Distance:   Left Eye Distance:   Bilateral Distance:    Right Eye Near:   Left Eye Near:    Bilateral Near:     Physical Exam Vitals and nursing note reviewed.  Constitutional:      Appearance: Normal appearance. She is not ill-appearing.  HENT:     Head: Atraumatic.     Mouth/Throat:     Mouth: Mucous membranes are moist.  Eyes:     Extraocular Movements: Extraocular movements intact.     Conjunctiva/sclera: Conjunctivae normal.     Pupils: Pupils are equal, round, and reactive to light.  Cardiovascular:     Rate and Rhythm: Normal rate  and regular rhythm.     Heart sounds: Normal heart sounds.  Pulmonary:     Effort: Pulmonary effort is normal.     Breath sounds: Normal breath sounds. No wheezing or rales.  Musculoskeletal:        General: Normal range of motion.     Cervical back: Normal range of motion and neck supple.  Skin:    General: Skin is warm and dry.  Neurological:     General: No focal deficit present.     Mental Status: She is alert and oriented to person, place, and time.     Motor: No weakness.     Gait: Gait normal.  Psychiatric:        Mood and Affect: Mood normal.        Thought Content: Thought content normal.        Judgment: Judgment normal.     UC Treatments / Results  Labs (all labs ordered are listed, but only abnormal results are displayed) Labs Reviewed - No data to display  EKG   Radiology No results found.  Procedures Procedures (including critical care time)  Medications Ordered in UC Medications  dexamethasone (DECADRON) injection 10 mg (10 mg Intramuscular Given 12/16/21 1243)    Initial Impression / Assessment and Plan / UC Course  I have reviewed the triage vital signs and the nursing notes.  Pertinent labs & imaging results that were available during my care of the patient were reviewed by me and considered in my medical decision making (see chart for details).     Vital signs and  exam very reassuring with no acute abnormalities noted.  EKG showing sinus bradycardia of 57 bpm but no ST or T wave changes.  Suspect her symptoms are all related to severe anxiety, grief reaction.  We will treat with IM Decadron for her resistant migraine, hydroxyzine as needed for anxiety and counseling resources given at behavioral health urgent care.  Discussed close PCP follow-up for further med management, resources.  Also gave resources for grief counseling.  Strict return precautions given for acutely worsening symptoms.  Final Clinical Impressions(s) / UC Diagnoses   Final diagnoses:  Severe anxiety  Grief  Chest tightness  Migraine without aura and without status migrainosus, not intractable     Discharge Instructions      [facility] Address: [address], [place], [place] [address] Phone: [phone]    ED Prescriptions     Medication Sig Dispense Auth. Provider   hydrOXYzine (ATARAX) 25 MG tablet Take 1 tablet (25 mg total) by mouth every 6 (six) hours as needed. May cause drowsiness 30 tablet Volney American, [place]      PDMP not reviewed this encounter.   Volney American, [place] 12/16/21 1319

## 2021-12-28 ENCOUNTER — Emergency Department (HOSPITAL_COMMUNITY)
Admission: EM | Admit: 2021-12-28 | Discharge: 2021-12-28 | Disposition: A | Discharge status: Home / Self Care | Payer: 59 | Attending: Emergency Medicine | Admitting: Emergency Medicine

## 2021-12-28 ENCOUNTER — Encounter (HOSPITAL_COMMUNITY): Payer: Self-pay | Admitting: *Deleted

## 2021-12-28 ENCOUNTER — Ambulatory Visit (INDEPENDENT_AMBULATORY_CARE_PROVIDER_SITE_OTHER): Payer: 59

## 2021-12-28 ENCOUNTER — Other Ambulatory Visit: Payer: Self-pay

## 2021-12-28 ENCOUNTER — Ambulatory Visit
Admission: EM | Admit: 2021-12-28 | Discharge: 2021-12-28 | Disposition: A | Discharge status: Home / Self Care | Payer: 59 | Attending: Urgent Care | Admitting: Urgent Care

## 2021-12-28 DIAGNOSIS — M5136 Other intervertebral disc degeneration, lumbar region: Secondary | ICD-10-CM

## 2021-12-28 DIAGNOSIS — M5441 Lumbago with sciatica, right side: Secondary | ICD-10-CM

## 2021-12-28 DIAGNOSIS — R0789 Other chest pain: Secondary | ICD-10-CM | POA: Diagnosis not present

## 2021-12-28 DIAGNOSIS — Z5321 Procedure and treatment not carried out due to patient leaving prior to being seen by health care provider: Secondary | ICD-10-CM | POA: Insufficient documentation

## 2021-12-28 DIAGNOSIS — M549 Dorsalgia, unspecified: Secondary | ICD-10-CM | POA: Insufficient documentation

## 2021-12-28 DIAGNOSIS — J4540 Moderate persistent asthma, uncomplicated: Secondary | ICD-10-CM | POA: Diagnosis not present

## 2021-12-28 DIAGNOSIS — R079 Chest pain, unspecified: Secondary | ICD-10-CM | POA: Diagnosis not present

## 2021-12-28 DIAGNOSIS — Z8616 Personal history of COVID-19: Secondary | ICD-10-CM

## 2021-12-28 DIAGNOSIS — R0602 Shortness of breath: Secondary | ICD-10-CM | POA: Diagnosis not present

## 2021-12-28 DIAGNOSIS — Z8709 Personal history of other diseases of the respiratory system: Secondary | ICD-10-CM

## 2021-12-28 MED ORDER — ALBUTEROL SULFATE HFA 108 (90 BASE) MCG/ACT IN AERS: 1.0000 | INHALATION_SPRAY | Freq: Four times a day (QID) | RESPIRATORY_TRACT | 0 refills | Status: DC | PRN

## 2021-12-28 MED ORDER — PREDNISONE 50 MG PO TABS: 50.0000 mg | ORAL_TABLET | Freq: Every day | ORAL | 0 refills | Status: DC

## 2021-12-28 NOTE — ED Notes (Signed)
Called for pt and no response from waiting room

## 2021-12-28 NOTE — ED Provider Notes (Signed)
[place]   MRN: [id number] DOB: December 07, 1976  Subjective:   Victoria Moore is a 46 y.o. female presenting for several day history of recurrent chest pain over the left side that radiates toward her back and left arm.  Has also felt short of breath.  Has a history of hypertension, asthma, diabetes treated without insulin.  Also has a history of sarcoidosis.  No history of PE.  Patient has also had low back pain that radiates to the left leg.  Has a history of sciatica, degenerative disc disease, bulging disc of the lumbar region.  Reports that she has had COVID-19 and flu within the past 6 months.  Patient is not a smoker.   Current Facility-Administered Medications:    nitroGLYCERIN (NITROSTAT) SL tablet 0.4 mg, 0.4 mg, Sublingual, Q5 min PRN, Ngetich, Dinah C, NP, 0.4 mg at 09/14/19 1621  Current Outpatient Medications:    ACETAMINOPHEN EXTRA STRENGTH 500 MG tablet, Take 1,000 mg by mouth every 6 (six) hours as needed for pain., Disp: , Rfl:    albuterol (PROVENTIL) (2.5 MG/3ML) 0.083% nebulizer solution, Take 2.5 mg by nebulization every 6 (six) hours as needed for wheezing or shortness of breath., Disp: , Rfl:    albuterol (VENTOLIN HFA) 108 (90 Base) MCG/ACT inhaler, Inhale 1-2 puffs into the lungs every 4 (four) hours as needed for wheezing or shortness of breath., Disp: 8 g, Rfl: 3   ALPRAZolam (XANAX) 0.25 MG tablet, Take 1 tablet (0.25 mg total) by mouth 2 (two) times daily as needed for anxiety., Disp: 60 tablet, Rfl: 0   amphetamine-dextroamphetamine (ADDERALL XR) 20 MG 24 hr capsule, Take 1 capsule (20 mg total) by mouth as needed., Disp: 30 capsule, Rfl: 0   beclomethasone (QVAR) 80 MCG/ACT inhaler, Inhale 2 puffs into the lungs 2 (two) times daily., Disp: 1 each, Rfl: 6   benzonatate (TESSALON) 100 MG capsule, Take 1 capsule (100 mg total) by mouth every 8 (eight) hours., Disp: 21 capsule, Rfl: 0   brompheniramine-pseudoephedrine-DM 30-2-10 MG/5ML syrup, Take 5 mLs  by mouth 4 (four) times daily as needed., Disp: 120 mL, Rfl: 0   dextromethorphan-guaiFENesin (MUCINEX DM) 30-600 MG 12hr tablet, Take 1 tablet by mouth 2 (two) times daily., Disp: 30 tablet, Rfl: 0   diphenoxylate-atropine (LOMOTIL) 2.5-0.025 MG tablet, Take 1-2 tablets by mouth 4 (four) times daily as needed for diarrhea or loose stools., Disp: 30 tablet, Rfl: 0   famotidine (PEPCID) 20 MG tablet, Take 1 tablet (20 mg total) by mouth 2 (two) times daily., Disp: 30 tablet, Rfl: 0   ferrous sulfate (FER-IN-SOL) 75 (15 Fe) MG/ML SOLN, Take 0.8 mLs (60 mg total) by mouth 2 (two) times daily. With food, Disp: 50 mL, Rfl: 0   GAVILAX 17 GM/SCOOP powder, Take 17 g by mouth daily., Disp: , Rfl:    hydrOXYzine (ATARAX) 25 MG tablet, Take 1 tablet (25 mg total) by mouth every 6 (six) hours as needed. May cause drowsiness, Disp: 30 tablet, Rfl: 0   metFORMIN (GLUCOPHAGE) 500 MG tablet, Take 500 mg by mouth 2 (two) times daily with a meal., Disp: , Rfl:    metoCLOPramide (REGLAN) 10 MG tablet, Take 1 tablet (10 mg total) by mouth 3 (three) times daily before meals., Disp: 30 tablet, Rfl: 1   ondansetron (ZOFRAN) 4 MG tablet, Take 4 mg by mouth every 8 (eight) hours as needed., Disp: , Rfl:    oxyCODONE-acetaminophen (PERCOCET/ROXICET) 5-325 MG tablet, Take 2 tablets by mouth every 12 (twelve) hours  as needed for severe pain., Disp: 8 tablet, Rfl: 0   pantoprazole (PROTONIX) 40 MG tablet, Take 1 tablet (40 mg total) by mouth 2 (two) times daily., Disp: 60 tablet, Rfl: 1   potassium chloride SA (KLOR-CON) 20 MEQ tablet, Take 1 tablet (20 mEq total) by mouth 2 (two) times daily., Disp: 10 tablet, Rfl: 0   promethazine (PROMETHEGAN) 12.5 MG suppository, Place 1 suppository (12.5 mg total) rectally every 6 (six) hours as needed for nausea or vomiting., Disp: 12 each, Rfl: 0   sucralfate (CARAFATE) 1 g tablet, Take 1 tablet (1 g total) by mouth 4 (four) times daily -  with meals and at bedtime., Disp: 60 tablet, Rfl:  0   SUMAtriptan (IMITREX) 100 MG tablet, TAKE 1 TABLET BY MOUTH 2X DAILY AS NEEDED FOR MIGRAINE. MAY REPEAT IN 2 HOURS IF HEADACHE PERSISTS OR RECURS., Disp: 9 tablet, Rfl: 3   traMADol (ULTRAM) 50 MG tablet, Take 50 mg by mouth every 6 (six) hours as needed for moderate pain. 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 Fibromyalgia, doubt sarcoid    Asthma    Blood transfusion without reported diagnosis    Chest wall pain 05/26/2018   Chronic kidney disease    Chronic low back pain with sciatica 09/15/2018   Common migraine with intractable migraine 10/07/2016   Cough variant asthma vs UACS  10/23/2016   - Spirometry 04/15/2016  Very truncated exp loop effort dep portion only  - Allergy profile 10/22/2016 >  Eos 0.2/  IgE  52 RAST POS grass/trees/ragweed  10/22/2016  After extensive coaching HFA effectiveness =    75% try duelra 100 2bid > improved   Daytime sleepiness 05/26/2018   Decreased pedal pulses 10/04/2018   Depression    Depression, major, single episode, moderate (HCC) 05/26/2018   Diabetes ([place])    Dyspnea 04/15/2016   04/15/2016  Walked RA x 3 laps @ 185 ft each stopped due to  End of study, nl pace, no sob or desat    - Spirometry 04/15/2016  Very truncated exp loop effort dep portion only  - 10/22/2016  Walked RA x 3 laps @ 185 ft each stopped due to  End of study, slow pace, min sob/ no desat - full pfts rec 10/22/2016 >>>       Edema    Essential hypertension, benign 11/15/2016   Fibroids 03/11/2016   Frequency of urination 09/11/2018   GERD (gastroesophageal reflux  disease)    Herniated lumbar intervertebral disc    Hiatal hernia    Hilar adenopathy    Hypertension    Hypokalemia 06/28/2018   Impaired fasting blood sugar 09/11/2018   Iron deficiency    Leg pain 12/07/2018   Low libido 11/27/2018   Migraine    Mild sleep apnea 08/03/2018   HST 06/20/18  AHI  8.1 / snoring with 02 nadir 80% >  08/03/2018 rec sleep medicine consultation  Morbid (severe) obesity due to excess calories ([place]) 10/23/2016   Personal history of sarcoidosis 05/26/2018   Prolonged capillary refill time    Right leg swelling 08/18/2018   Sarcoidosis    personal history of   Seizures ([place])    history of    Sleep apnea    Spondylosis    Vitamin D deficiency      Past Surgical History:  Procedure Laterality Date   ABDOMINAL HYSTERECTOMY     ESOPHAGEAL MANOMETRY N/A 09/05/2019   Procedure: ESOPHAGEAL MANOMETRY (EM);  Surgeon: Lavena Bullion, DO;  Location: [facility];  Service: Gastroenterology;  Laterality: N/A;   LAPAROSCOPIC OVARIAN CYSTECTOMY Left 03/11/2016   Procedure: LAPAROSCOPIC OVARIAN CYSTECTOMY;  Surgeon: Eldred Manges, MD;  Location: [facility];  Service: Gynecology;  Laterality: Left;   LAPAROSCOPIC VAGINAL HYSTERECTOMY WITH SALPINGECTOMY Bilateral 03/11/2016   Procedure: LAPAROSCOPIC ASSISTED VAGINAL HYSTERECTOMY WITH SALPINGECTOMY;  Surgeon: Eldred Manges, MD;  Location: [facility];  Service: Gynecology;  Laterality: Bilateral;   [place] IMPEDANCE STUDY  09/05/2019   Procedure: [place] IMPEDANCE STUDY;  Surgeon: Lavena Bullion, DO;  Location: [facility];  Service: Gastroenterology;;   TUBAL LIGATION     UPPER GASTROINTESTINAL ENDOSCOPY  10/11/2019   06/2019    Family History  Adopted: Yes  Problem Relation Age of Onset   Other Son        Growing pains   Asthma Mother    Heart Problems Mother    Migraines Mother    COPD Mother    Heart attack Mother    Diabetes Father    Peptic Ulcer Father    Heart Problems Father    COPD Father    Heart attack Father     Colon cancer Neg Hx    Colon polyps Neg Hx    Esophageal cancer Neg Hx    Rectal cancer Neg Hx    Stomach cancer Neg Hx     Social History   Tobacco Use   Smoking status: Former    Packs/day: 0.25    Years: 17.00    Pack years: 4.25    Types: Cigarettes    Quit date: 03/02/2013    Years since quitting: 8.8   Smokeless tobacco: Never  Vaping Use   Vaping Use: Never used  Substance Use Topics   Alcohol use: No    Alcohol/week: 0.0 standard drinks   Drug use: Not Currently    Types: Marijuana    Comment: former - 6 yrs ago    ROS   Objective:   Vitals: BP 134/83 (BP Location: Right Arm)    Pulse 84    Temp 99.4 F (37.4 C) (Oral)    Resp 18    LMP 02/19/2016    SpO2 97%   Physical Exam Constitutional:      General: She is not in acute distress.    Appearance: Normal appearance. She is well-developed. She is not ill-appearing, toxic-appearing or diaphoretic.  HENT:     Head: Normocephalic and atraumatic.     Nose: Nose normal.     Mouth/Throat:     Mouth: Mucous membranes are moist.  Eyes:     Extraocular Movements: Extraocular movements intact.     Pupils: Pupils are equal, round, and reactive to light.  Cardiovascular:     Rate and Rhythm: Normal rate and regular rhythm.     Pulses: Normal pulses.     Heart sounds: Normal heart sounds. No murmur heard.   No friction rub.  No gallop.  Pulmonary:     Effort: Pulmonary effort is normal. No respiratory distress.     Breath sounds: Normal breath sounds. No stridor. No wheezing, rhonchi or rales.  Musculoskeletal:     Lumbar back: Spasms and tenderness (over areas outlined) present. No swelling, edema, deformity, signs of trauma, lacerations or bony tenderness. Decreased range of motion. Positive right straight leg raise test. Negative left straight leg raise test. No scoliosis.       Back:  Skin:    General: Skin is warm and dry.     Findings: No rash.  Neurological:     Mental Status: She is alert and  oriented to person, place, and time.  Psychiatric:        Mood and Affect: Mood normal.        Behavior: Behavior normal.        Thought Content: Thought content normal.    ED ECG REPORT   Date: 12/28/2021  EKG Time: 6:00 PM  Rate: 78bpm  Rhythm: normal sinus rhythm,  normal EKG, normal sinus rhythm  Axis:  Normal  Intervals:none  ST&T Change: Nonspecific T wave flattening in lead III, aVF, V4 through V5.  Very comparable to previous EKGs.  Narrative Interpretation: Sinus rhythm at 78 bpm with nonspecific T wave changes.  Very comparable to previous EKG.  DG Chest 2 View  Result Date: 12/28/2021 CLINICAL DATA:  Shortness of breath.  Intermittent chest pain. EXAM: CHEST - 2 VIEW COMPARISON:  Most recent radiograph 11/18/2021, CT 10/27/2020 FINDINGS: The cardiomediastinal contours are normal. The lungs are clear. Pulmonary vasculature is normal. No consolidation, pleural effusion, or pneumothorax. No acute osseous abnormalities are seen. IMPRESSION: No acute chest findings. Electronically Signed   By: Keith Rake M.D.   On: 12/28/2021 18:21     Assessment and Plan :   PDMP not reviewed this encounter.  1. Atypical chest pain   2. Bulging lumbar disc   3. Degenerative disc disease, lumbar   4. Acute bilateral low back pain with right-sided sciatica   5. Moderate persistent asthma without complication   6. History of COVID-19   7. History of influenza    Recommended an oral prednisone course which I expect will help with her back pain, lumbar radiculopathy and respiratory/chest symptoms.  Patient has a low Wells criteria, discussed possibility of a pulmonary embolism but for now she does not want to present to the emergency room to have this ruled out.  Recommended treatment as above.  Deferred COVID-19 and flu testing as she has had this within the past 6 months.  Patient is in agreement.  Use supportive care otherwise, maintain all regular medications. Counseled patient on  potential for adverse effects with medications prescribed/recommended today, ER and return-to-clinic precautions discussed, patient verbalized understanding.    Jaynee Eagles, [place] 12/28/21 1825

## 2021-12-28 NOTE — ED Triage Notes (Signed)
Back pain x 2 days

## 2021-12-28 NOTE — ED Triage Notes (Signed)
Pt reports lower back pain radiates to left leg, left sided, on and off chest pain, on and off right arm pain. States chest pain is worse when moving.

## 2022-01-20 ENCOUNTER — Ambulatory Visit: Payer: Self-pay

## 2022-01-21 ENCOUNTER — Ambulatory Visit
Admission: RE | Admit: 2022-01-21 | Discharge: 2022-01-21 | Discharge status: Against Medical Advice | Payer: 59 | Source: Ambulatory Visit | Attending: Urgent Care | Admitting: Urgent Care

## 2022-01-21 ENCOUNTER — Other Ambulatory Visit: Payer: Self-pay

## 2022-01-21 VITALS — BP 130/85 | HR 62 | Temp 98.8°F | Resp 18

## 2022-01-21 DIAGNOSIS — R1030 Lower abdominal pain, unspecified: Secondary | ICD-10-CM | POA: Diagnosis not present

## 2022-01-21 DIAGNOSIS — R079 Chest pain, unspecified: Secondary | ICD-10-CM | POA: Diagnosis not present

## 2022-01-21 DIAGNOSIS — G8929 Other chronic pain: Secondary | ICD-10-CM

## 2022-01-21 DIAGNOSIS — R42 Dizziness and giddiness: Secondary | ICD-10-CM

## 2022-01-21 DIAGNOSIS — R519 Headache, unspecified: Secondary | ICD-10-CM | POA: Diagnosis not present

## 2022-01-21 DIAGNOSIS — R0789 Other chest pain: Secondary | ICD-10-CM

## 2022-01-21 DIAGNOSIS — M545 Low back pain, unspecified: Secondary | ICD-10-CM

## 2022-01-21 LAB — POCT URINALYSIS DIP (MANUAL ENTRY)
Bilirubin, UA: NEGATIVE
Blood, UA: NEGATIVE
Glucose, UA: NEGATIVE mg/dL
Ketones, POC UA: NEGATIVE mg/dL
Leukocytes, UA: NEGATIVE
Nitrite, UA: NEGATIVE
Protein Ur, POC: NEGATIVE mg/dL
Spec Grav, UA: >=1.03 — AB (ref 1.010–1.025)
Urobilinogen, UA: 1 E.U./dL
pH, UA: 5.5 (ref 5.0–8.0)

## 2022-01-21 NOTE — ED Provider Notes (Signed)
[place]   MRN: [id number] DOB: [birth year]  Subjective:   Victoria Moore is a 46 y.o. female presenting for 1 week history of recurrent pan positive review of systems as below.  Has past medical history of seizure disorder, CKD, sarcoidosis, chronic chest pain, back pain with sciatica, asthmatic bronchitis, migraines.  She has a PCP.  Most concerning symptom is reported that the acute on chronic chest pain.  She would also like to make sure that she does not have an UTI.   Current Facility-Administered Medications:    nitroGLYCERIN (NITROSTAT) SL tablet 0.4 mg, 0.4 mg, Sublingual, Q5 min PRN, Ngetich, Dinah C, NP, 0.4 mg at 09/14/19 1621  Current Outpatient Medications:    ACETAMINOPHEN EXTRA STRENGTH 500 MG tablet, Take 1,000 mg by mouth every 6 (six) hours as needed for pain., Disp: , Rfl:    albuterol (PROVENTIL) (2.5 MG/3ML) 0.083% nebulizer solution, Take 2.5 mg by nebulization every 6 (six) hours as needed for wheezing or shortness of breath., Disp: , Rfl:    albuterol (VENTOLIN HFA) 108 (90 Base) MCG/ACT inhaler, Inhale 1-2 puffs into the lungs every 4 (four) hours as needed for wheezing or shortness of breath., Disp: 8 g, Rfl: 3   albuterol (VENTOLIN HFA) 108 (90 Base) MCG/ACT inhaler, Inhale 1-2 puffs into the lungs every 6 (six) hours as needed for wheezing or shortness of breath., Disp: 18 g, Rfl: 0   ALPRAZolam (XANAX) 0.25 MG tablet, Take 1 tablet (0.25 mg total) by mouth 2 (two) times daily as needed for anxiety., Disp: 60 tablet, Rfl: 0   amphetamine-dextroamphetamine (ADDERALL XR) 20 MG 24 hr capsule, Take 1 capsule (20 mg total) by mouth as needed., Disp: 30 capsule, Rfl: 0   beclomethasone (QVAR) 80 MCG/ACT inhaler, Inhale 2 puffs into the lungs 2 (two) times daily., Disp: 1 each, Rfl: 6   benzonatate (TESSALON) 100 MG capsule, Take 1 capsule (100 mg total) by mouth every 8 (eight) hours., Disp: 21 capsule, Rfl: 0   brompheniramine-pseudoephedrine-DM  30-2-10 MG/5ML syrup, Take 5 mLs by mouth 4 (four) times daily as needed., Disp: 120 mL, Rfl: 0   dextromethorphan-guaiFENesin (MUCINEX DM) 30-600 MG 12hr tablet, Take 1 tablet by mouth 2 (two) times daily., Disp: 30 tablet, Rfl: 0   diphenoxylate-atropine (LOMOTIL) 2.5-0.025 MG tablet, Take 1-2 tablets by mouth 4 (four) times daily as needed for diarrhea or loose stools., Disp: 30 tablet, Rfl: 0   famotidine (PEPCID) 20 MG tablet, Take 1 tablet (20 mg total) by mouth 2 (two) times daily., Disp: 30 tablet, Rfl: 0   ferrous sulfate (FER-IN-SOL) 75 (15 Fe) MG/ML SOLN, Take 0.8 mLs (60 mg total) by mouth 2 (two) times daily. With food, Disp: 50 mL, Rfl: 0   GAVILAX 17 GM/SCOOP powder, Take 17 g by mouth daily., Disp: , Rfl:    hydrOXYzine (ATARAX) 25 MG tablet, Take 1 tablet (25 mg total) by mouth every 6 (six) hours as needed. May cause drowsiness, Disp: 30 tablet, Rfl: 0   metFORMIN (GLUCOPHAGE) 500 MG tablet, Take 500 mg by mouth 2 (two) times daily with a meal., Disp: , Rfl:    metoCLOPramide (REGLAN) 10 MG tablet, Take 1 tablet (10 mg total) by mouth 3 (three) times daily before meals., Disp: 30 tablet, Rfl: 1   ondansetron (ZOFRAN) 4 MG tablet, Take 4 mg by mouth every 8 (eight) hours as needed., Disp: , Rfl:    oxyCODONE-acetaminophen (PERCOCET/ROXICET) 5-325 MG tablet, Take 2 tablets by mouth every 12 (twelve) hours  as needed for severe pain., Disp: 8 tablet, Rfl: 0   pantoprazole (PROTONIX) 40 MG tablet, Take 1 tablet (40 mg total) by mouth 2 (two) times daily., Disp: 60 tablet, Rfl: 1   potassium chloride SA (KLOR-CON) 20 MEQ tablet, Take 1 tablet (20 mEq total) by mouth 2 (two) times daily., Disp: 10 tablet, Rfl: 0   predniSONE (DELTASONE) 50 MG tablet, Take 1 tablet (50 mg total) by mouth daily with breakfast., Disp: 5 tablet, Rfl: 0   promethazine (PROMETHEGAN) 12.5 MG suppository, Place 1 suppository (12.5 mg total) rectally every 6 (six) hours as needed for nausea or vomiting., Disp: 12  each, Rfl: 0   sucralfate (CARAFATE) 1 g tablet, Take 1 tablet (1 g total) by mouth 4 (four) times daily -  with meals and at bedtime., Disp: 60 tablet, Rfl: 0   SUMAtriptan (IMITREX) 100 MG tablet, TAKE 1 TABLET BY MOUTH 2X DAILY AS NEEDED FOR MIGRAINE. MAY REPEAT IN 2 HOURS IF HEADACHE PERSISTS OR RECURS., Disp: 9 tablet, Rfl: 3   traMADol (ULTRAM) 50 MG tablet, Take 50 mg by mouth every 6 (six) hours as needed for moderate pain. 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 Fibromyalgia, doubt sarcoid    Asthma    Blood transfusion without reported diagnosis    Chest wall pain 05/26/2018   Chronic kidney disease    Chronic low back pain with sciatica 09/15/2018   Common migraine with intractable migraine 10/07/2016   Cough variant asthma vs UACS  10/23/2016   - Spirometry 04/15/2016  Very truncated exp loop effort dep portion only  - Allergy profile 10/22/2016 >  Eos 0.2/  IgE  52 RAST POS grass/trees/ragweed  10/22/2016  After extensive coaching HFA effectiveness =    75% try duelra 100 2bid > improved   Daytime sleepiness 05/26/2018   Decreased pedal pulses 10/04/2018   Depression    Depression, major, single episode, moderate (HCC) 05/26/2018   Diabetes ([place])    Dyspnea 04/15/2016   04/15/2016  Walked RA x 3 laps @ 185 ft each stopped due to  End of study, nl pace, no sob or desat    - Spirometry 04/15/2016  Very truncated exp loop effort dep portion only  - 10/22/2016  Walked RA x 3 laps @ 185 ft each stopped due to  End of study, slow pace, min sob/ no desat - full pfts rec 10/22/2016  >>>       Edema    Essential hypertension, benign 11/15/2016   Fibroids 03/11/2016   Frequency of urination 09/11/2018   GERD (gastroesophageal reflux disease)    Herniated lumbar intervertebral disc    Hiatal hernia    Hilar adenopathy    Hypertension    Hypokalemia 06/28/2018   Impaired fasting blood sugar 09/11/2018   Iron deficiency    Leg pain 12/07/2018   Low libido 11/27/2018   Migraine    Mild sleep  apnea 08/03/2018   HST 06/20/18  AHI  8.1 / snoring with 02 nadir 80% >  08/03/2018 rec sleep medicine consultation    Morbid (severe) obesity due to excess calories ([place]) 10/23/2016   Personal history of sarcoidosis 05/26/2018   Prolonged capillary refill time    Right leg swelling 08/18/2018   Sarcoidosis    personal history of   Seizures ([place])    history of    Sleep apnea    Spondylosis    Vitamin D deficiency      Past Surgical History:  Procedure Laterality Date   ABDOMINAL HYSTERECTOMY     ESOPHAGEAL MANOMETRY N/A 09/05/2019   Procedure: ESOPHAGEAL MANOMETRY (EM);  Surgeon: Lavena Bullion, DO;  [facility]: [facility];  Service: Gastroenterology;  Laterality: N/A;   LAPAROSCOPIC OVARIAN CYSTECTOMY Left 03/11/2016   Procedure: LAPAROSCOPIC OVARIAN CYSTECTOMY;  Surgeon: Eldred Manges, MD;  Location: [facility];  Service: Gynecology;  Laterality: Left;   LAPAROSCOPIC VAGINAL HYSTERECTOMY WITH SALPINGECTOMY Bilateral 03/11/2016   Procedure: LAPAROSCOPIC ASSISTED VAGINAL HYSTERECTOMY WITH SALPINGECTOMY;  Surgeon: Eldred Manges, MD;  Location: [facility];  Service: Gynecology;  Laterality: Bilateral;   [place] IMPEDANCE STUDY  09/05/2019   Procedure: [place] IMPEDANCE STUDY;  Surgeon: Lavena Bullion, DO;  Location: [facility];  Service: Gastroenterology;;   TUBAL LIGATION     UPPER GASTROINTESTINAL ENDOSCOPY  10/11/2019   06/2019    Family History  Adopted: Yes  Problem Relation Age of Onset   Other Son        Growing pains   Asthma Mother    Heart Problems Mother    Migraines  Mother    COPD Mother    Heart attack Mother    Diabetes Father    Peptic Ulcer Father    Heart Problems Father    COPD Father    Heart attack Father    Colon cancer Neg Hx    Colon polyps Neg Hx    Esophageal cancer Neg Hx    Rectal cancer Neg Hx    Stomach cancer Neg Hx     Social History   Tobacco Use   Smoking status: Former    Packs/day: 0.25    Years: 17.00    Pack years: 4.25    Types: Cigarettes    Quit date: 03/02/2013    Years since quitting: 8.8   Smokeless tobacco: Never  Vaping Use   Vaping Use: Never used  Substance Use Topics   Alcohol use: No    Alcohol/week: 0.0 standard drinks   Drug use: Not Currently    Types: Marijuana    Comment: former - 6 yrs ago    Review of Systems  Constitutional:  Positive for chills.  HENT:  Positive for ear pain.        Dry mouth.   Respiratory:  Positive for shortness of breath.   Cardiovascular:  Positive for chest pain and palpitations.  Gastrointestinal:  Positive for abdominal pain.  Musculoskeletal:  Positive for back pain.  Neurological:  Positive for dizziness and headaches.    Objective:   Vitals: BP 130/85 (BP Location: Right Arm)    Pulse 62    Temp 98.8 F (37.1 C) (Oral)    Resp 18    LMP 02/19/2016    SpO2 97%   Physical Exam Constitutional:      General: She is not in acute distress.    Appearance: Normal appearance. She is well-developed. She is not ill-appearing, toxic-appearing or diaphoretic.  HENT:     Head: Normocephalic and atraumatic.     Nose: Nose normal.     Mouth/Throat:     Mouth: Mucous membranes are moist.  Eyes:     General: No scleral icterus.       Right eye: No discharge.        Left eye: No discharge.     Extraocular Movements: Extraocular movements intact.  Cardiovascular:     Rate and Rhythm: Normal rate.  Pulmonary:     Effort: Pulmonary effort is normal.  Skin:    General: Skin is warm and dry.  Neurological:     General: No focal deficit present.     Mental  Status: She is alert and oriented to person, place, and time.  Psychiatric:        Mood and Affect: Mood normal.        Behavior: Behavior normal.    Results for orders placed or performed during the hospital encounter of 01/21/22 (from the past 24 hour(s))  POCT urinalysis dipstick     Status: Abnormal   Collection Time: 01/21/22 10:31 AM  Result Value Ref Range   Color, UA yellow yellow   Clarity, UA clear clear   Glucose, UA negative negative mg/dL   Bilirubin, UA negative negative   Ketones, POC UA negative negative mg/dL   Spec Grav, UA >=1.030 (A) 1.010 - 1.025   Blood, UA negative negative   pH, UA 5.5 5.0 - 8.0   Protein Ur, POC negative negative mg/dL   Urobilinogen, UA 1.0 0.2 or 1.0 E.U./dL   Nitrite, UA Negative Negative   Leukocytes, UA Negative Negative   ED ECG REPORT   Date: 01/21/2022  EKG Time: 10:50 AM  Rate: 58bpm  Rhythm: sinus bradycardia,  normal EKG, normal sinus rhythm, unchanged from previous tracings  Axis: normal  Intervals:none  ST&T Change: non-specific t-wave flattening in all leads except I, II, V6.   Narrative Interpretation: Sinus bradycardia at 58 bpm with nonspecific T wave changes as above.  Very comparable to previous EKG.  Assessment and Plan :   PDMP not reviewed this encounter.  1. Atypical chest pain   2. Chronic chest pain   3. Generalized headaches   4. Lower abdominal pain   5. Chronic low back pain, unspecified back pain laterality, unspecified whether sciatica present   6. Dizziness    At triage, an EKG was performed given her chest pain.  I reviewed this with patient afterward and there is no acute abnormality.  Urinalysis was pending.  Patient left without being seen thereafter.  I was able to emphasized need for follow-up with her PCP given her recurrent pan positive review of systems.  I also discussed that given the nature of her complaints including dizziness, headaches, chest pain that she may benefit from an ER visit  to rule out an acute cardiopulmonary process or an acute encephalopathy.  Patient verbalized understanding.   Jaynee Eagles, PA-C 01/21/22 1055

## 2022-01-21 NOTE — ED Notes (Signed)
Patient came out of room and states "tell him I'm leaving, there's no need for me to stay".  Patient then walked out of treatment area without having any further communication.

## 2022-01-21 NOTE — ED Triage Notes (Signed)
Pt reports lower back pain radiates to left leg x 4-5 days;  headache x 2 days; dry mouth, racing heart, left ear pain, dizziness and lower abdominal pain x 1 day.

## 2022-02-01 ENCOUNTER — Ambulatory Visit
Admission: RE | Admit: 2022-02-01 | Discharge: 2022-02-01 | Disposition: A | Discharge status: Home / Self Care | Payer: 59 | Source: Ambulatory Visit | Attending: Family Medicine | Admitting: Family Medicine

## 2022-02-01 ENCOUNTER — Other Ambulatory Visit: Payer: Self-pay | Admitting: Family Medicine

## 2022-02-01 ENCOUNTER — Other Ambulatory Visit: Payer: Self-pay

## 2022-02-01 DIAGNOSIS — J029 Acute pharyngitis, unspecified: Secondary | ICD-10-CM

## 2022-02-01 DIAGNOSIS — J069 Acute upper respiratory infection, unspecified: Secondary | ICD-10-CM | POA: Diagnosis not present

## 2022-02-01 DIAGNOSIS — H66002 Acute suppurative otitis media without spontaneous rupture of ear drum, left ear: Secondary | ICD-10-CM

## 2022-02-01 LAB — POCT RAPID STREP A (OFFICE): Rapid Strep A Screen: NEGATIVE

## 2022-02-01 MED ORDER — AMOXICILLIN 875 MG PO TABS: 875.0000 mg | ORAL_TABLET | Freq: Two times a day (BID) | ORAL | 0 refills | Status: DC

## 2022-02-01 MED ORDER — LIDOCAINE VISCOUS HCL 2 % MT SOLN
10.0000 mL | OROMUCOSAL | 0 refills | Status: DC | PRN
Start: 2022-02-01 — End: 2022-05-26

## 2022-02-01 MED ORDER — FLUTICASONE PROPIONATE 50 MCG/ACT NA SUSP: 1.0000 | Freq: Two times a day (BID) | NASAL | 0 refills | Status: DC

## 2022-02-01 NOTE — ED Provider Notes (Signed)
[facility]    CSN: [id number] Arrival date & time: 02/01/22  1653      History   Chief Complaint Chief Complaint  Patient presents with   Sore Throat    HPI Victoria Moore is a 46 y.o. female.   Presenting today with 1 day history of sore, swollen throat that feels like something is stuck in it, congestion, low-grade fever, left ear pain and pressure, muffled hearing.  Denies cough, trouble breathing, chest pain, shortness of breath, abdominal pain, nausea vomiting or diarrhea.  So far not trying anything over-the-counter for symptoms.  No known sick contacts recently.   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 Fibromyalgia, doubt sarcoid    Asthma    Blood transfusion without reported diagnosis    Chest wall pain 05/26/2018   Chronic kidney disease    Chronic low back pain with sciatica 09/15/2018   Common migraine with intractable migraine 10/07/2016   Cough variant asthma vs UACS  10/23/2016   - Spirometry 04/15/2016  Very truncated exp loop effort dep portion only  - Allergy profile 10/22/2016 >  Eos 0.2/  IgE  52 RAST POS grass/trees/ragweed  10/22/2016  After extensive coaching HFA effectiveness =    75% try duelra 100 2bid > improved   Daytime sleepiness 05/26/2018   Decreased pedal pulses 10/04/2018   Depression    Depression, major, single episode, moderate ([place]) 05/26/2018   Diabetes ([place])    Dyspnea 04/15/2016   04/15/2016  Walked RA x 3 laps @ 185 ft each stopped due to  End of study, nl pace, no sob or desat    - Spirometry 04/15/2016  Very truncated exp loop effort dep portion only  - 10/22/2016  Walked RA x 3 laps @ 185 ft each stopped due to  End of study, slow pace, min sob/ no desat - full pfts rec 10/22/2016 >>>       Edema    Essential hypertension, benign  11/15/2016   Fibroids 03/11/2016   Frequency of urination 09/11/2018   GERD (gastroesophageal reflux disease)    Herniated lumbar intervertebral disc    Hiatal hernia    Hilar adenopathy    Hypertension    Hypokalemia 06/28/2018   Impaired fasting blood sugar 09/11/2018   Iron deficiency    Leg pain 12/07/2018   Low libido 11/27/2018   Migraine    Mild sleep apnea 08/03/2018   HST 06/20/18  AHI  8.1 / snoring with 02 nadir 80% >  08/03/2018 rec sleep medicine consultation    Morbid (severe) obesity due to excess calories ([place]) 10/23/2016   Personal history of sarcoidosis 05/26/2018   Prolonged capillary refill time    Right leg swelling 08/18/2018   Sarcoidosis    personal history of   Seizures ([place])    history of    Sleep apnea    Spondylosis    Vitamin D deficiency     Patient Active Problem List   Diagnosis Date Noted   Asthmatic bronchitis 01/23/2021   COVID-19 virus infection 01/23/2021   Social anxiety disorder 10/03/2020   Attention deficit hyperactivity disorder (ADHD), 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   Mild episode of recurrent major depressive disorder ([place]) 10/03/2020  Constipation 05/20/2020   S/P gastric bypass 05/12/2020   Intractable abdominal pain 04/27/2020   Benign intracranial hypertension 02/14/2020   Myopia of both eyes 02/14/2020   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   BMI 45.0-49.9, adult ([place]) 11/29/2019   Other intervertebral disc degeneration, lumbar region 09/19/2019   Complex cyst of left ovary 02/01/2019   Sarcoidosis 02/01/2019   Leg pain 12/07/2018   Low libido 11/27/2018   Prolonged capillary refill time 10/04/2018   Decreased pedal pulses 10/04/2018   Spondylosis 09/15/2018   Chronic low back pain with sciatica 09/15/2018   Impaired fasting blood sugar 09/11/2018   Weight gain 09/11/2018   Mild sleep apnea 08/03/2018   Hypokalemia 06/28/2018   Medication side effect 06/22/2018   Acute low back pain without sciatica 06/22/2018    Personal history of sarcoidosis 05/26/2018   Vitamin D deficiency 05/26/2018   Encounter for health maintenance examination with abnormal findings 05/26/2018   Depression, major, single episode, moderate ([place]) 05/26/2018   Daytime sleepiness 05/26/2018   Chest pain of uncertain etiology 65/78/4696   GERD (gastroesophageal reflux disease) 05/26/2018   Essential hypertension, benign 11/15/2016   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/10/2016   Morbid (severe) obesity due to excess calories ([place]) 10/23/2016   Common migraine with intractable migraine 10/07/2016   DOE (dyspnea on exertion) 04/15/2016   Hilar adenopathy 04/15/2016   Seizures ([place]) 03/11/2016   Anemia 11/12/2014   Iron deficiency 11/12/2014    Past Surgical History:  Procedure Laterality Date   ABDOMINAL HYSTERECTOMY     ESOPHAGEAL MANOMETRY N/A 09/05/2019   Procedure: ESOPHAGEAL MANOMETRY (EM);  Surgeon: Lavena Bullion, DO;  Location: [facility];  Service: Gastroenterology;  Laterality: N/A;   LAPAROSCOPIC OVARIAN CYSTECTOMY Left 03/11/2016   Procedure: LAPAROSCOPIC OVARIAN CYSTECTOMY;  Surgeon: Eldred Manges, MD;  Location: [facility];  Service: Gynecology;  Laterality: Left;   LAPAROSCOPIC VAGINAL HYSTERECTOMY WITH SALPINGECTOMY Bilateral 03/11/2016   Procedure: LAPAROSCOPIC ASSISTED VAGINAL HYSTERECTOMY WITH SALPINGECTOMY;  Surgeon: Eldred Manges, MD;  Location: [facility];  Service: Gynecology;  Laterality: Bilateral;   [place] IMPEDANCE STUDY  09/05/2019   Procedure: [place] IMPEDANCE STUDY;  Surgeon: Lavena Bullion, DO;  Location: [facility];  Service: Gastroenterology;;   TUBAL LIGATION     UPPER GASTROINTESTINAL ENDOSCOPY  10/11/2019   06/2019    OB History     Gravida  1   Para      Term      Preterm      AB      Living         SAB      IAB      Ectopic      Multiple      Live Births               Home Medications    Prior to Admission medications   Medication Sig Start Date End Date Taking?  Authorizing Provider  amoxicillin (AMOXIL) 875 MG tablet Take 1 tablet (875 mg total) by mouth 2 (two) times daily. 02/01/22  Yes Volney American, PA-C  fluticasone [facility]) 50 MCG/ACT nasal spray Place 1 spray into both nostrils 2 (two) times daily. 02/01/22  Yes Volney American, PA-C  lidocaine (XYLOCAINE) 2 % solution Use as directed 10 mLs in the mouth or throat every 3 (three) hours as needed for mouth pain. 02/01/22  Yes Volney American, PA-C  ACETAMINOPHEN EXTRA STRENGTH 500 MG tablet Take 1,000 mg by mouth every 6 (six)  hours as needed for pain. 04/08/20   [provider]  albuterol (PROVENTIL) (2.5 MG/3ML) 0.083% nebulizer solution Take 2.5 mg by nebulization every 6 (six) hours as needed for wheezing or shortness of breath.    [provider]  albuterol (VENTOLIN HFA) 108 (90 Base) MCG/ACT inhaler Inhale 1-2 puffs into the lungs every 4 (four) hours as needed for wheezing or shortness of breath. 01/23/21   Parrett, Fonnie Mu, NP  albuterol (VENTOLIN HFA) 108 (90 Base) MCG/ACT inhaler Inhale 1-2 puffs into the lungs every 6 (six) hours as needed for wheezing or shortness of breath. 12/28/21   Jaynee Eagles, PA-C  ALPRAZolam Duanne Moron) 0.25 MG tablet Take 1 tablet (0.25 mg total) by mouth 2 (two) times daily as needed for anxiety. 10/03/20   Salley Slaughter, NP  amphetamine-dextroamphetamine (ADDERALL XR) 20 MG 24 hr capsule Take 1 capsule (20 mg total) by mouth as needed. 12/17/20   [place], Terese Door, PA  beclomethasone (QVAR) 80 MCG/ACT inhaler Inhale 2 puffs into the lungs 2 (two) times daily. 01/23/21   Parrett, Fonnie Mu, NP  benzonatate (TESSALON) 100 MG capsule Take 1 capsule (100 mg total) by mouth every 8 (eight) hours. 05/12/21   Faustino Congress, NP  brompheniramine-pseudoephedrine-DM 30-2-10 MG/5ML syrup Take 5 mLs by mouth 4 (four) times daily as needed. 05/07/21   Vanessa Kick, MD  dextromethorphan-guaiFENesin [facility] DM) 30-600 MG 12hr tablet Take 1 tablet  by mouth 2 (two) times daily. 01/01/21   Hazel Sams, PA-C  diphenoxylate-atropine (LOMOTIL) 2.5-0.025 MG tablet Take 1-2 tablets by mouth 4 (four) times daily as needed for diarrhea or loose stools. 05/11/21   Scot Jun, FNP  famotidine (PEPCID) 20 MG tablet Take 1 tablet (20 mg total) by mouth 2 (two) times daily. 02/10/21   Varney Biles, MD  ferrous sulfate (FER-IN-SOL) 75 (15 Fe) MG/ML SOLN Take 0.8 mLs (60 mg total) by mouth 2 (two) times daily. With food 07/22/20 08/21/20  Esterwood, Amy S, PA-C  GAVILAX 17 GM/SCOOP powder Take 17 g by mouth daily. 05/20/20   [provider]  hydrOXYzine (ATARAX) 25 MG tablet Take 1 tablet (25 mg total) by mouth every 6 (six) hours as needed. May cause drowsiness 12/16/21   Volney American, PA-C  metFORMIN (GLUCOPHAGE) 500 MG tablet Take 500 mg by mouth 2 (two) times daily with a meal.    [provider]  metoCLOPramide (REGLAN) 10 MG tablet Take 1 tablet (10 mg total) by mouth 3 (three) times daily before meals. 02/12/21   Maudie Flakes, MD  ondansetron (ZOFRAN) 4 MG tablet Take 4 mg by mouth every 8 (eight) hours as needed. 05/13/20   [provider]  oxyCODONE-acetaminophen (PERCOCET/ROXICET) 5-325 MG tablet Take 2 tablets by mouth every 12 (twelve) hours as needed for severe pain. 02/10/21   Varney Biles, MD  pantoprazole (PROTONIX) 40 MG tablet Take 1 tablet (40 mg total) by mouth 2 (two) times daily. 10/01/21 10/31/21  LampteyMyrene Galas, MD  potassium chloride SA (KLOR-CON) 20 MEQ tablet Take 1 tablet (20 mEq total) by mouth 2 (two) times daily. 07/23/20   [place]sterwood, Amy S, PA-C  predniSONE (DELTASONE) 50 MG tablet Take 1 tablet (50 mg total) by mouth daily with breakfast. 12/28/21   Jaynee Eagles, PA-C  promethazine (PROMETHEGAN) 12.5 MG suppository Place 1 suppository (12.5 mg total) rectally every 6 (six) hours as needed for nausea or vomiting. 08/16/20   Lavonia Drafts, MD  sucralfate (CARAFATE) 1 g tablet Take 1  tablet (1 g total) by mouth 4 (four) times daily -  with meals and at bedtime. 10/01/21   Lamptey, Myrene Galas, MD  SUMAtriptan (IMITREX) 100 MG tablet TAKE 1 TABLET BY MOUTH 2X DAILY AS NEEDED FOR MIGRAINE. MAY REPEAT IN 2 HOURS IF HEADACHE PERSISTS OR RECURS. 12/11/19   Kathrynn Ducking, MD  traMADol (ULTRAM) 50 MG tablet Take 50 mg by mouth every 6 (six) hours as needed for moderate pain.  03/27/20   [provider]  traZODone (DESYREL) 50 MG tablet Take 1 tablet (50 mg total) by mouth at bedtime as needed for sleep. 10/03/20   Salley Slaughter, NP  chlorthalidone (HYGROTON) 25 MG tablet TAKE 1 TABLET BY MOUTH EVERY DAY Patient taking differently: Take 25 mg by mouth daily.  11/05/19 01/01/21  Ngetich, Nelda Bucks, NP    Family History Family History  Adopted: Yes  Problem Relation Age of Onset   Other Son        Growing pains   Asthma Mother    Heart Problems Mother    Migraines Mother    COPD Mother    Heart attack Mother    Diabetes Father    Peptic Ulcer Father    Heart Problems Father    COPD Father    Heart attack Father    Colon cancer Neg Hx    Colon polyps Neg Hx    Esophageal cancer Neg Hx    Rectal cancer Neg Hx    Stomach cancer Neg Hx     Social History Social History   Tobacco Use   Smoking status: Former    Packs/day: 0.25    Years: 17.00    Pack years: 4.25    Types: Cigarettes    Quit date: 03/02/2013    Years since quitting: 8.9   Smokeless tobacco: Never  Vaping Use   Vaping Use: Never used  Substance Use Topics   Alcohol use: No    Alcohol/week: 0.0 standard drinks   Drug use: Not Currently    Types: Marijuana    Comment: former - 6 yrs ago     Allergies   Aspirin   Review of Systems Review of Systems Per HPI  Physical Exam Triage Vital Signs ED Triage Vitals [02/01/22 1733]  Enc Vitals Group     BP 125/85     Pulse Rate 67     Resp 18     Temp 99 F (37.2 C)     Temp src      SpO2 97 %     Weight      Height      Head  Circumference      Peak Flow      Pain Score      Pain Loc      Pain Edu?      Excl. in [place]?    No data found.  Updated Vital Signs BP 125/85    Pulse 67    Temp 99 F (37.2 C)    Resp 18    LMP 02/19/2016    SpO2 97%   Visual Acuity Right Eye Distance:   Left Eye Distance:   Bilateral Distance:    Right Eye Near:   Left Eye Near:    Bilateral Near:     Physical Exam Vitals and nursing note reviewed.  Constitutional:      Appearance: Normal appearance. She is not ill-appearing.  HENT:     Head: Atraumatic.  Right Ear: Tympanic membrane normal.     Ears:     Comments: Right TM erythematous and edematous    Nose: Rhinorrhea present.     Mouth/Throat:     Mouth: Mucous membranes are moist.     Pharynx: Posterior oropharyngeal erythema present. No oropharyngeal exudate.  Eyes:     Extraocular Movements: Extraocular movements intact.     Conjunctiva/sclera: Conjunctivae normal.  Cardiovascular:     Rate and Rhythm: Normal rate and regular rhythm.     Heart sounds: Normal heart sounds.  Pulmonary:     Effort: Pulmonary effort is normal.     Breath sounds: Normal breath sounds. No wheezing or rales.  Musculoskeletal:        General: Normal range of motion.     Cervical back: Normal range of motion and neck supple.  Skin:    General: Skin is warm and dry.  Neurological:     Mental Status: She is alert and oriented to person, place, and time.     Motor: No weakness.     Gait: Gait normal.  Psychiatric:        Mood and Affect: Mood normal.        Thought Content: Thought content normal.        Judgment: Judgment normal.   UC Treatments / Results  Labs (all labs ordered are listed, but only abnormal results are displayed) Labs Reviewed  COVID-19, FLU A+B NAA  POCT RAPID STREP A (OFFICE)    EKG   Radiology No results found.  Procedures Procedures (including critical care time)  Medications Ordered in UC Medications - No data to display  Initial  Impression / Assessment and Plan / UC Course  I have reviewed the triage vital signs and the nursing notes.  Pertinent labs & imaging results that were available during my care of the patient were reviewed by me and considered in my medical decision making (see chart for details).     Vital signs benign and reassuring, rapid strep negative.  COVID and flu testing pending.  Will treat with viscous lidocaine, Flonase, DayQuil and NyQuil for upper respiratory symptoms and amoxicillin for the left ear infection secondary to this.  Work note given.  Return for acutely worsening symptoms.  Final Clinical Impressions(s) / UC Diagnoses   Final diagnoses:  Viral URI  Sore throat  Acute suppurative otitis media of left ear without spontaneous rupture of tympanic membrane, recurrence not specified   Discharge Instructions   None    ED Prescriptions     Medication Sig Dispense Auth. Provider   amoxicillin (AMOXIL) 875 MG tablet Take 1 tablet (875 mg total) by mouth 2 (two) times daily. 20 tablet Volney American, PA-C   fluticasone [facility]) 50 MCG/ACT nasal spray Place 1 spray into both nostrils 2 (two) times daily. 16 g Volney American, PA-C   lidocaine (XYLOCAINE) 2 % solution Use as directed 10 mLs in the mouth or throat every 3 (three) hours as needed for mouth pain. 100 mL Volney American, [place]      PDMP not reviewed this encounter.   Volney American, [place] 02/01/22 1839

## 2022-02-01 NOTE — ED Triage Notes (Signed)
Pt presents with c/o sore throat that began this morning, states it feels like something stuck in throat

## 2022-02-02 LAB — COVID-19, FLU A+B NAA
Influenza A, NAA: NOT DETECTED
Influenza B, NAA: NOT DETECTED
SARS-CoV-2, NAA: NOT DETECTED

## 2022-02-02 NOTE — Telephone Encounter (Signed)
Provider no longer at practice Requested Prescriptions  Pending Prescriptions Disp Refills   fluticasone (FLONASE) 50 MCG/ACT nasal spray [Pharmacy Med Name: FLUTICASONE 50MCG NAS SP(120SP) RX] 48 g     Sig: SHAKE LIQUID AND USE 1 SPRAY IN EACH NOSTRIL TWICE DAILY     Not Delegated - Ear, Nose, and Throat: Nasal Preparations - Corticosteroids Failed - 02/01/2022  6:43 PM      Failed - This refill cannot be delegated      Failed - Valid encounter within last 12 months    Recent Outpatient Visits          3 years ago Other headache syndrome   [place], Vernia Buff, NP   4 years ago Chronic fatigue   [place], Vernia Buff, NP   4 years ago Hypertension, unspecified type   [place], [place]   5 years ago Neck pain   [facility], Audrie Lia, PA-C      Future Appointments            In 3 weeks Nahser, Wonda Cheng, MD [place], [facility]

## 2022-02-15 ENCOUNTER — Other Ambulatory Visit: Payer: Self-pay

## 2022-02-15 ENCOUNTER — Ambulatory Visit (INDEPENDENT_AMBULATORY_CARE_PROVIDER_SITE_OTHER): Payer: 59

## 2022-02-15 ENCOUNTER — Ambulatory Visit
Admission: RE | Admit: 2022-02-15 | Discharge: 2022-02-15 | Disposition: A | Discharge status: Home / Self Care | Payer: 59 | Source: Ambulatory Visit | Attending: Family Medicine | Admitting: Family Medicine

## 2022-02-15 VITALS — BP 126/77 | HR 63 | Temp 98.5°F | Resp 18

## 2022-02-15 DIAGNOSIS — M79642 Pain in left hand: Secondary | ICD-10-CM

## 2022-02-15 NOTE — ED Triage Notes (Signed)
Pt presents with left hand pain and some swelling that began this morning denies injury

## 2022-02-16 ENCOUNTER — Telehealth: Payer: Self-pay | Admitting: Family Medicine

## 2022-02-16 MED ORDER — PREDNISONE 20 MG PO TABS: 40.0000 mg | ORAL_TABLET | Freq: Every day | ORAL | 0 refills | Status: DC

## 2022-02-16 NOTE — Telephone Encounter (Signed)
Prednisone re-sent from yesterday's visit

## 2022-02-19 NOTE — ED Provider Notes (Signed)
[facility]    CSN: [id number] Arrival date & time: 02/15/22  1345      History   Chief Complaint Chief Complaint  Patient presents with   Hand Pain    HPI Victoria Moore is a 46 y.o. female.   Presenting today with 1 day history of left hand and wrist pain, some swelling with no known injury.  Movement seems to make the pain worse, rest makes it better.  Denies numbness, tingling, discoloration, weakness.  Not trying anything for symptoms thus far.  States she does a lot of repetitive motion with this hand at work.   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 Fibromyalgia, doubt sarcoid    Asthma    Blood transfusion without reported diagnosis    Chest wall pain 05/26/2018   Chronic kidney disease    Chronic low back pain with sciatica 09/15/2018   Common migraine with intractable migraine 10/07/2016   Cough variant asthma vs UACS  10/23/2016   - Spirometry 04/15/2016  Very truncated exp loop effort dep portion only  - Allergy profile 10/22/2016 >  Eos 0.2/  IgE  52 RAST POS grass/trees/ragweed  10/22/2016  After extensive coaching HFA effectiveness =    75% try duelra 100 2bid > improved   Daytime sleepiness 05/26/2018   Decreased pedal pulses 10/04/2018   Depression    Depression, major, single episode, moderate ([place]) 05/26/2018   Diabetes ([place])    Dyspnea 04/15/2016   04/15/2016  Walked RA x 3 laps @ 185 ft each stopped due to  End of study, nl pace, no sob or desat    - Spirometry 04/15/2016  Very truncated exp loop effort dep portion only  - 10/22/2016  Walked RA x 3 laps @ 185 ft each stopped due to  End of study, slow pace, min sob/ no desat - full pfts rec 10/22/2016 >>>       Edema    Essential hypertension, benign 11/15/2016   Fibroids 03/11/2016   Frequency of  urination 09/11/2018   GERD (gastroesophageal reflux disease)    Herniated lumbar intervertebral disc    Hiatal hernia    Hilar adenopathy    Hypertension    Hypokalemia 06/28/2018   Impaired fasting blood sugar 09/11/2018   Iron deficiency    Leg pain 12/07/2018   Low libido 11/27/2018   Migraine    Mild sleep apnea 08/03/2018   HST 06/20/18  AHI  8.1 / snoring with 02 nadir 80% >  08/03/2018 rec sleep medicine consultation    Morbid (severe) obesity due to excess calories ([place]) 10/23/2016   Personal history of sarcoidosis 05/26/2018   Prolonged capillary refill time    Right leg swelling 08/18/2018   Sarcoidosis    personal history of   Seizures ([place])    history of    Sleep apnea    Spondylosis    Vitamin D deficiency     Patient Active Problem List   Diagnosis Date Noted   Asthmatic bronchitis 01/23/2021   COVID-19 virus infection 01/23/2021   Social anxiety disorder 10/03/2020   Attention deficit hyperactivity disorder (ADHD), 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   Mild episode of recurrent major depressive disorder ([place]) 10/03/2020  Constipation 05/20/2020   S/P gastric bypass 05/12/2020   Intractable abdominal pain 04/27/2020   Benign intracranial hypertension 02/14/2020   Myopia of both eyes 02/14/2020   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   BMI 45.0-49.9, adult ([place]) 11/29/2019   Other intervertebral disc degeneration, lumbar region 09/19/2019   Complex cyst of left ovary 02/01/2019   Sarcoidosis 02/01/2019   Leg pain 12/07/2018   Low libido 11/27/2018   Prolonged capillary refill time 10/04/2018   Decreased pedal pulses 10/04/2018   Spondylosis 09/15/2018   Chronic low back pain with sciatica 09/15/2018   Impaired fasting blood sugar 09/11/2018   Weight gain 09/11/2018   Mild sleep apnea 08/03/2018   Hypokalemia 06/28/2018   Medication side effect 06/22/2018   Acute low back pain without sciatica 06/22/2018   Personal history of sarcoidosis 05/26/2018    Vitamin D deficiency 05/26/2018   Encounter for health maintenance examination with abnormal findings 05/26/2018   Depression, major, single episode, moderate ([place]) 05/26/2018   Daytime sleepiness 05/26/2018   Chest pain of uncertain etiology 52/84/1324   GERD (gastroesophageal reflux disease) 05/26/2018   Essential hypertension, benign 11/15/2016   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/10/2016   Morbid (severe) obesity due to excess calories ([place]) 10/23/2016   Common migraine with intractable migraine 10/07/2016   DOE (dyspnea on exertion) 04/15/2016   Hilar adenopathy 04/15/2016   Seizures ([place]) 03/11/2016   Anemia 11/12/2014   Iron deficiency 11/12/2014    Past Surgical History:  Procedure Laterality Date   ABDOMINAL HYSTERECTOMY     ESOPHAGEAL MANOMETRY N/A 09/05/2019   Procedure: ESOPHAGEAL MANOMETRY (EM);  Surgeon: Lavena Bullion, DO;  Location: [facility];  Service: Gastroenterology;  Laterality: N/A;   LAPAROSCOPIC OVARIAN CYSTECTOMY Left 03/11/2016   Procedure: LAPAROSCOPIC OVARIAN CYSTECTOMY;  Surgeon: Eldred Manges, MD;  Location: [facility];  Service: Gynecology;  Laterality: Left;   LAPAROSCOPIC VAGINAL HYSTERECTOMY WITH SALPINGECTOMY Bilateral 03/11/2016   Procedure: LAPAROSCOPIC ASSISTED VAGINAL HYSTERECTOMY WITH SALPINGECTOMY;  Surgeon: Eldred Manges, MD;  Location: [facility];  Service: Gynecology;  Laterality: Bilateral;   [place] IMPEDANCE STUDY  09/05/2019   Procedure: [place] IMPEDANCE STUDY;  Surgeon: Lavena Bullion, DO;  Location: [facility];  Service: Gastroenterology;;   TUBAL LIGATION     UPPER GASTROINTESTINAL ENDOSCOPY  10/11/2019   06/2019    OB History     Gravida  1   Para      Term      Preterm      AB      Living         SAB      IAB      Ectopic      Multiple      Live Births               Home Medications    Prior to Admission medications   Medication Sig Start Date End Date Taking? Authorizing Provider  ACETAMINOPHEN EXTRA  STRENGTH 500 MG tablet Take 1,000 mg by mouth every 6 (six) hours as needed for pain. 04/08/20   [provider]  albuterol (PROVENTIL) (2.5 MG/3ML) 0.083% nebulizer solution Take 2.5 mg by nebulization every 6 (six) hours as needed for wheezing or shortness of breath.    [provider]  albuterol (VENTOLIN HFA) 108 (90 Base) MCG/ACT inhaler Inhale 1-2 puffs into the lungs every 4 (four) hours as needed for wheezing or shortness of breath. 01/23/21   Parrett, Fonnie Mu, NP  albuterol (VENTOLIN HFA) 108 (  90 Base) MCG/ACT inhaler Inhale 1-2 puffs into the lungs every 6 (six) hours as needed for wheezing or shortness of breath. 12/28/21   Jaynee Eagles, PA-C  ALPRAZolam Duanne Moron) 0.25 MG tablet Take 1 tablet (0.25 mg total) by mouth 2 (two) times daily as needed for anxiety. 10/03/20   Salley Slaughter, NP  amoxicillin (AMOXIL) 875 MG tablet Take 1 tablet (875 mg total) by mouth 2 (two) times daily. 02/01/22   Volney American, PA-C  amphetamine-dextroamphetamine (ADDERALL XR) 20 MG 24 hr capsule Take 1 capsule (20 mg total) by mouth as needed. 12/17/20   [place], Terese Door, PA  beclomethasone (QVAR) 80 MCG/ACT inhaler Inhale 2 puffs into the lungs 2 (two) times daily. 01/23/21   Parrett, Fonnie Mu, NP  benzonatate (TESSALON) 100 MG capsule Take 1 capsule (100 mg total) by mouth every 8 (eight) hours. 05/12/21   Faustino Congress, NP  brompheniramine-pseudoephedrine-DM 30-2-10 MG/5ML syrup Take 5 mLs by mouth 4 (four) times daily as needed. 05/07/21   Vanessa Kick, MD  dextromethorphan-guaiFENesin [facility] DM) 30-600 MG 12hr tablet Take 1 tablet by mouth 2 (two) times daily. 01/01/21   Hazel Sams, PA-C  diphenoxylate-atropine (LOMOTIL) 2.5-0.025 MG tablet Take 1-2 tablets by mouth 4 (four) times daily as needed for diarrhea or loose stools. 05/11/21   Scot Jun, FNP  famotidine (PEPCID) 20 MG tablet Take 1 tablet (20 mg total) by mouth 2 (two) times daily. 02/10/21   Varney Biles, MD   ferrous sulfate (FER-IN-SOL) 75 (15 Fe) MG/ML SOLN Take 0.8 mLs (60 mg total) by mouth 2 (two) times daily. With food 07/22/20 08/21/20  Esterwood, Amy S, PA-C  fluticasone (FLONASE) 50 MCG/ACT nasal spray Place 1 spray into both nostrils 2 (two) times daily. 02/01/22   Volney American, PA-C  GAVILAX 17 GM/SCOOP powder Take 17 g by mouth daily. 05/20/20   [provider]  hydrOXYzine (ATARAX) 25 MG tablet Take 1 tablet (25 mg total) by mouth every 6 (six) hours as needed. May cause drowsiness 12/16/21   Volney American, PA-C  lidocaine (XYLOCAINE) 2 % solution Use as directed 10 mLs in the mouth or throat every 3 (three) hours as needed for mouth pain. 02/01/22   Volney American, PA-C  metFORMIN (GLUCOPHAGE) 500 MG tablet Take 500 mg by mouth 2 (two) times daily with a meal.    [provider]  metoCLOPramide (REGLAN) 10 MG tablet Take 1 tablet (10 mg total) by mouth 3 (three) times daily before meals. 02/12/21   Maudie Flakes, MD  ondansetron (ZOFRAN) 4 MG tablet Take 4 mg by mouth every 8 (eight) hours as needed. 05/13/20   [provider]  oxyCODONE-acetaminophen (PERCOCET/ROXICET) 5-325 MG tablet Take 2 tablets by mouth every 12 (twelve) hours as needed for severe pain. 02/10/21   Varney Biles, MD  pantoprazole (PROTONIX) 40 MG tablet Take 1 tablet (40 mg total) by mouth 2 (two) times daily. 10/01/21 10/31/21  LampteyMyrene Galas, MD  potassium chloride SA (KLOR-CON) 20 MEQ tablet Take 1 tablet (20 mEq total) by mouth 2 (two) times daily. 07/23/20   Esterwood, Amy S, PA-C  predniSONE (DELTASONE) 20 MG tablet Take 2 tablets (40 mg total) by mouth daily with breakfast. 02/16/22   Volney American, PA-C  predniSONE (DELTASONE) 50 MG tablet Take 1 tablet (50 mg total) by mouth daily with breakfast. 12/28/21   Jaynee Eagles, PA-C  promethazine (PROMETHEGAN) 12.5 MG suppository Place 1 suppository (12.5 mg total) rectally  every 6 (six) hours as needed for nausea or  vomiting. 08/16/20   Lavonia Drafts, MD  sucralfate (CARAFATE) 1 g tablet Take 1 tablet (1 g total) by mouth 4 (four) times daily -  with meals and at bedtime. 10/01/21   Lamptey, Myrene Galas, MD  SUMAtriptan (IMITREX) 100 MG tablet TAKE 1 TABLET BY MOUTH 2X DAILY AS NEEDED FOR MIGRAINE. MAY REPEAT IN 2 HOURS IF HEADACHE PERSISTS OR RECURS. 12/11/19   Kathrynn Ducking, MD  traMADol (ULTRAM) 50 MG tablet Take 50 mg by mouth every 6 (six) hours as needed for moderate pain.  03/27/20   [provider]  traZODone (DESYREL) 50 MG tablet Take 1 tablet (50 mg total) by mouth at bedtime as needed for sleep. 10/03/20   Salley Slaughter, NP  chlorthalidone (HYGROTON) 25 MG tablet TAKE 1 TABLET BY MOUTH EVERY DAY Patient taking differently: Take 25 mg by mouth daily.  11/05/19 01/01/21  Ngetich, Nelda Bucks, NP    Family History Family History  Adopted: Yes  Problem Relation Age of Onset   Other Son        Growing pains   Asthma Mother    Heart Problems Mother    Migraines Mother    COPD Mother    Heart attack Mother    Diabetes Father    Peptic Ulcer Father    Heart Problems Father    COPD Father    Heart attack Father    Colon cancer Neg Hx    Colon polyps Neg Hx    Esophageal cancer Neg Hx    Rectal cancer Neg Hx    Stomach cancer Neg Hx     Social History Social History   Tobacco Use   Smoking status: Former    Packs/day: 0.25    Years: 17.00    Pack years: 4.25    Types: Cigarettes    Quit date: 03/02/2013    Years since quitting: 8.9   Smokeless tobacco: Never  Vaping Use   Vaping Use: Never used  Substance Use Topics   Alcohol use: No    Alcohol/week: 0.0 standard drinks   Drug use: Not Currently    Types: Marijuana    Comment: former - 6 yrs ago     Allergies   Aspirin   Review of Systems Review of Systems Per HPI  Physical Exam Triage Vital Signs ED Triage Vitals  Enc Vitals Group     BP 02/15/22 1458 126/77     Pulse Rate 02/15/22 1458 63     Resp  02/15/22 1458 18     Temp 02/15/22 1458 98.5 F (36.9 C)     Temp src --      SpO2 02/15/22 1458 98 %     Weight --      Height --      Head Circumference --      Peak Flow --      Pain Score 02/15/22 1456 9     Pain Loc --      Pain Edu? --      Excl. in [place]? --    No data found.  Updated Vital Signs BP 126/77    Pulse 63    Temp 98.5 F (36.9 C)    Resp 18    LMP 02/19/2016    SpO2 98%   Visual Acuity Right Eye Distance:   Left Eye Distance:   Bilateral Distance:    Right Eye Near:   Left Eye  Near:    Bilateral Near:     Physical Exam Vitals and nursing note reviewed.  Constitutional:      Appearance: Normal appearance. She is not ill-appearing.  HENT:     Head: Atraumatic.  Eyes:     Extraocular Movements: Extraocular movements intact.     Conjunctiva/sclera: Conjunctivae normal.  Cardiovascular:     Rate and Rhythm: Normal rate and regular rhythm.     Heart sounds: Normal heart sounds.  Pulmonary:     Effort: Pulmonary effort is normal.     Breath sounds: Normal breath sounds.  Musculoskeletal:        General: Swelling and tenderness present. No deformity or signs of injury. Normal range of motion.     Cervical back: Normal range of motion and neck supple.     Comments: Mild diffuse edema, tenderness to palpation left wrist and hand.  Range of motion intact, strength full and equal bilateral hands.  No point tenderness to palpation  Skin:    General: Skin is warm and dry.     Findings: No erythema.  Neurological:     Mental Status: She is alert and oriented to person, place, and time.     Comments: Left hand neurovascularly intact  Psychiatric:        Mood and Affect: Mood normal.        Thought Content: Thought content normal.        Judgment: Judgment normal.     UC Treatments / Results  Labs (all labs ordered are listed, but only abnormal results are displayed) Labs Reviewed - No data to display  EKG   Radiology No results  found.  Procedures Procedures (including critical care time)  Medications Ordered in UC Medications - No data to display  Initial Impression / Assessment and Plan / UC Course  I have reviewed the triage vital signs and the nursing notes.  Pertinent labs & imaging results that were available during my care of the patient were reviewed by me and considered in my medical decision making (see chart for details).     X-ray of the left hand negative for acute bony abnormality, showing arthritic changes.  Suspect inflammatory cause of pain.  Treat with prednisone, RICE protocol.  Return for acutely worsening symptoms.  Final Clinical Impressions(s) / UC Diagnoses   Final diagnoses:  Left hand pain   Discharge Instructions   None    ED Prescriptions   None    PDMP not reviewed this encounter.   Volney American, [place] 02/19/22 [phone]

## 2022-02-28 ENCOUNTER — Encounter: Payer: Self-pay | Admitting: Cardiovascular Disease

## 2022-02-28 ENCOUNTER — Other Ambulatory Visit: Payer: Self-pay | Admitting: Family Medicine

## 2022-02-28 NOTE — Progress Notes (Signed)
This encounter was created in error - please disregard.

## 2022-03-01 ENCOUNTER — Encounter: Payer: Self-pay | Admitting: Cardiovascular Disease

## 2022-03-01 NOTE — Telephone Encounter (Signed)
Victoria Moore, pt requesting Flonase, this has become a not delegated med. Please assess. ?

## 2022-03-09 ENCOUNTER — Other Ambulatory Visit: Payer: Self-pay

## 2022-03-09 ENCOUNTER — Encounter (HOSPITAL_COMMUNITY): Payer: Self-pay

## 2022-03-09 ENCOUNTER — Emergency Department (HOSPITAL_COMMUNITY)
Admission: EM | Admit: 2022-03-09 | Discharge: 2022-03-09 | Disposition: A | Discharge status: Home / Self Care | Payer: 59 | Attending: Emergency Medicine | Admitting: Emergency Medicine

## 2022-03-09 DIAGNOSIS — K0889 Other specified disorders of teeth and supporting structures: Secondary | ICD-10-CM | POA: Diagnosis present

## 2022-03-09 DIAGNOSIS — K0530 Chronic periodontitis, unspecified: Secondary | ICD-10-CM | POA: Insufficient documentation

## 2022-03-09 DIAGNOSIS — K0389 Other specified diseases of hard tissues of teeth: Secondary | ICD-10-CM | POA: Diagnosis not present

## 2022-03-09 MED ORDER — NAPROXEN 250 MG PO TABS: 500.0000 mg | ORAL_TABLET | Freq: Once | ORAL | Status: DC

## 2022-03-09 MED ORDER — PENICILLIN V POTASSIUM 500 MG PO TABS
500.0000 mg | ORAL_TABLET | Freq: Four times a day (QID) | ORAL | 0 refills | Status: AC
Start: 2022-03-09 — End: 2022-03-16

## 2022-03-09 MED ORDER — ACETAMINOPHEN 500 MG PO TABS: 500.0000 mg | ORAL_TABLET | Freq: Four times a day (QID) | ORAL | 0 refills | Status: DC | PRN

## 2022-03-09 MED ORDER — HYDROCODONE-ACETAMINOPHEN 5-325 MG PO TABS
1.0000 | ORAL_TABLET | Freq: Once | ORAL | Status: AC
  Administered 2022-03-09: 1 via ORAL
  Filled 2022-03-09: qty 1

## 2022-03-09 NOTE — ED Provider Notes (Signed)
?[facility] ?Provider Note ? ? ?CSN: [id number] ?Arrival date & time: 03/09/22  0807 ? ?  ? ?History ? ?Chief Complaint  ?Patient presents with  ? Dental Pain  ? ? ?Victoria Moore is a 46 y.o. female. ? ?HPI ? ?  ? ?46 year old female comes in with chief complaint of dental pain for the last 2 days.  Pain is located in the lower, front teeth.  She has had history of multiple tooth extractions, but typically the front teeth have not been a major issue.  She does have heat and cold sensitivity.  Denies any fevers or chills.  No bleeding. ? ?Home Medications ?Prior to Admission medications   ?Medication Sig Start Date End Date Taking? Authorizing Provider  ?acetaminophen (TYLENOL) 500 MG tablet Take 1 tablet (500 mg total) by mouth every 6 (six) hours as needed. 03/09/22  Yes Varney Biles, MD  ?penicillin v potassium (VEETID) 500 MG tablet Take 1 tablet (500 mg total) by mouth 4 (four) times daily for 7 days. 03/09/22 03/16/22 Yes Varney Biles, MD  ?albuterol (PROVENTIL) (2.5 MG/3ML) 0.083% nebulizer solution Take 2.5 mg by nebulization every 6 (six) hours as needed for wheezing or shortness of breath.    [provider]  ?albuterol (VENTOLIN HFA) 108 (90 Base) MCG/ACT inhaler Inhale 1-2 puffs into the lungs every 4 (four) hours as needed for wheezing or shortness of breath. 01/23/21   Parrett, Fonnie Mu, NP  ?albuterol (VENTOLIN HFA) 108 (90 Base) MCG/ACT inhaler Inhale 1-2 puffs into the lungs every 6 (six) hours as needed for wheezing or shortness of breath. 12/28/21   Jaynee Eagles, PA-C  ?ALPRAZolam (XANAX) 0.25 MG tablet Take 1 tablet (0.25 mg total) by mouth 2 (two) times daily as needed for anxiety. 10/03/20   Salley Slaughter, NP  ?amphetamine-dextroamphetamine (ADDERALL XR) 20 MG 24 hr capsule Take 1 capsule (20 mg total) by mouth as needed. 12/17/20   Nwoko, Terese Door, PA  ?beclomethasone (QVAR) 80 MCG/ACT inhaler Inhale 2 puffs into the lungs 2 (two) times daily. 01/23/21    Parrett, Fonnie Mu, NP  ?benzonatate (TESSALON) 100 MG capsule Take 1 capsule (100 mg total) by mouth every 8 (eight) hours. 05/12/21   Faustino Congress, NP  ?brompheniramine-pseudoephedrine-DM 30-2-10 MG/5ML syrup Take 5 mLs by mouth 4 (four) times daily as needed. 05/07/21   Vanessa Kick, MD  ?dextromethorphan-guaiFENesin [facility] DM) 30-600 MG 12hr tablet Take 1 tablet by mouth 2 (two) times daily. 01/01/21   Hazel Sams, PA-C  ?diphenoxylate-atropine (LOMOTIL) 2.5-0.025 MG tablet Take 1-2 tablets by mouth 4 (four) times daily as needed for diarrhea or loose stools. 05/11/21   Scot Jun, FNP  ?famotidine (PEPCID) 20 MG tablet Take 1 tablet (20 mg total) by mouth 2 (two) times daily. 02/10/21   Varney Biles, MD  ?ferrous sulfate (FER-IN-SOL) 75 (15 Fe) MG/ML SOLN Take 0.8 mLs (60 mg total) by mouth 2 (two) times daily. With food 07/22/20 08/21/20  Esterwood, Amy S, PA-C  ?fluticasone (FLONASE) 50 MCG/ACT nasal spray Place 1 spray into both nostrils 2 (two) times daily. 02/01/22   Volney American, PA-C  ?GAVILAX 17 GM/SCOOP powder Take 17 g by mouth daily. 05/20/20   [provider]  ?hydrOXYzine (ATARAX) 25 MG tablet Take 1 tablet (25 mg total) by mouth every 6 (six) hours as needed. May cause drowsiness 12/16/21   Volney American, PA-C  ?lidocaine (XYLOCAINE) 2 % solution Use as directed 10 mLs in the mouth or throat  every 3 (three) hours as needed for mouth pain. 02/01/22   Volney American, PA-C  ?metFORMIN (GLUCOPHAGE) 500 MG tablet Take 500 mg by mouth 2 (two) times daily with a meal.    [provider]  ?metoCLOPramide (REGLAN) 10 MG tablet Take 1 tablet (10 mg total) by mouth 3 (three) times daily before meals. 02/12/21   Maudie Flakes, MD  ?ondansetron (ZOFRAN) 4 MG tablet Take 4 mg by mouth every 8 (eight) hours as needed. 05/13/20   [provider]  ?pantoprazole (PROTONIX) 40 MG tablet Take 1 tablet (40 mg total) by mouth 2 (two) times daily. 10/01/21  10/31/21  Chase Picket, MD  ?potassium chloride SA (KLOR-CON) 20 MEQ tablet Take 1 tablet (20 mEq total) by mouth 2 (two) times daily. 07/23/20   Esterwood, Amy S, PA-C  ?predniSONE (DELTASONE) 20 MG tablet Take 2 tablets (40 mg total) by mouth daily with breakfast. 02/16/22   Volney American, PA-C  ?predniSONE (DELTASONE) 50 MG tablet Take 1 tablet (50 mg total) by mouth daily with breakfast. 12/28/21   Jaynee Eagles, PA-C  ?promethazine (PROMETHEGAN) 12.5 MG suppository Place 1 suppository (12.5 mg total) rectally every 6 (six) hours as needed for nausea or vomiting. 08/16/20   Lavonia Drafts, MD  ?sucralfate (CARAFATE) 1 g tablet Take 1 tablet (1 g total) by mouth 4 (four) times daily -  with meals and at bedtime. 10/01/21   Lamptey, Myrene Galas, MD  ?SUMAtriptan (IMITREX) 100 MG tablet TAKE 1 TABLET BY MOUTH 2X DAILY AS NEEDED FOR MIGRAINE. MAY REPEAT IN 2 HOURS IF HEADACHE PERSISTS OR RECURS. 12/11/19   Kathrynn Ducking, MD  ?traMADol (ULTRAM) 50 MG tablet Take 50 mg by mouth every 6 (six) hours as needed for moderate pain.  03/27/20   [provider]  ?traZODone (DESYREL) 50 MG tablet Take 1 tablet (50 mg total) by mouth at bedtime as needed for sleep. 10/03/20   Salley Slaughter, NP  ?chlorthalidone (HYGROTON) 25 MG tablet TAKE 1 TABLET BY MOUTH EVERY DAY ?Patient taking differently: Take 25 mg by mouth daily.  11/05/19 01/01/21  Ngetich, Nelda Bucks, NP  ?   ? ?Allergies    ?Aspirin   ? ?Review of Systems   ?Review of Systems ? ?Physical Exam ?Updated Vital Signs ?BP 126/86 (BP Location: Right Arm)   Pulse (!) 59   Temp 98.1 ?F (36.7 ?C) (Oral)   Resp 18   Ht '5\' 8"'$  (1.727 m)   Wt 88.5 kg   LMP 02/19/2016   SpO2 100%   BMI 29.65 kg/m?  ?Physical Exam ?Vitals and nursing note reviewed.  ?Constitutional:   ?   Appearance: She is well-developed.  ?HENT:  ?   Head: Atraumatic.  ?   Mouth/Throat:  ?   Comments: Patient has no edema, fluctuance around the gingiva.  No bleeding.  No drainage around the  gingiva. ? ?Over the lower incisor teeth, patient has tenderness with palpation ?Cardiovascular:  ?   Rate and Rhythm: Normal rate.  ?Pulmonary:  ?   Effort: Pulmonary effort is normal.  ?Musculoskeletal:  ?   Cervical back: Normal range of motion and neck supple.  ?Skin: ?   General: Skin is warm and dry.  ?Neurological:  ?   Mental Status: She is alert and oriented to person, place, and time.  ? ? ?ED Results / Procedures / Treatments   ?Labs ?(all labs ordered are listed, but only abnormal results are displayed) ?Labs Reviewed -  No data to display ? ?EKG ?None ? ?Radiology ?No results found. ? ?Procedures ?Procedures  ? ? ?Medications Ordered in ED ?Medications  ?HYDROcodone-acetaminophen (NORCO/VICODIN) 5-325 MG per tablet 1 tablet (has no administration in time range)  ? ? ?ED Course/ Medical Decision Making/ A&P ?  ?                        ?Medical Decision Making ?Risk ?OTC drugs. ?Prescription drug management. ? ? ?46 year old female comes in with chief complaint of lower teeth pain. ? ?Differential diagnoses included gingival abscess, periapical abscess, nerve root inflammation, gingivitis. ? ?Based on exam, appears that most likely the nerve root is irritated, possibly because of infection.  No evidence of abscess for [place] to drain.  No imaging indicated.  No evidence of any fracture or avulsion based on her exam. ? ?Plan is to put patient on Pen-Vee K and Tylenol for pain control.  She has been advised to call her dentist for a follow-up.  ? ?Final Clinical Impression(s) / ED Diagnoses ?Final diagnoses:  ?Tooth sensitivity to cold  ?Periodontitis  ? ? ?Rx / DC Orders ?ED Discharge Orders   ? ?      Ordered  ?  penicillin v potassium (VEETID) 500 MG tablet  4 times daily       ? 03/09/22 0919  ?  acetaminophen (TYLENOL) 500 MG tablet  Every 6 hours PRN       ? 03/09/22 0919  ? ?  ?  ? ?  ? ? ?  ?Varney Biles, MD ?03/09/22 [phone] ? ?

## 2022-03-09 NOTE — Discharge Instructions (Addendum)
Please follow-up with your dentist as soon as possible. ? ?We are giving you antibiotics, but we suspect that there is likely some nerve irritation that is leading to the discomfort you are having.  We also suspect some gingival inflammation.  We recommend that he continue to floss and use mouthwash twice a day. ? ?Take Tylenol every 4-6 hours as prescribed. ?

## 2022-03-09 NOTE — ED Triage Notes (Signed)
Patient complaining of dental pain for a couple days on the bottom front.  ?

## 2022-03-16 ENCOUNTER — Ambulatory Visit (HOSPITAL_COMMUNITY): Payer: Self-pay

## 2022-03-17 ENCOUNTER — Ambulatory Visit: Payer: Self-pay

## 2022-05-04 ENCOUNTER — Ambulatory Visit: Payer: Self-pay

## 2022-05-12 ENCOUNTER — Ambulatory Visit: Payer: 59 | Admitting: Family Medicine

## 2022-05-25 NOTE — Progress Notes (Unsigned)
Cardiology Office Note    Date:  05/26/2022   ID:  Victoria Moore, DOB [birth year], MRN [id number]   PCP:  [name], MD   [place]  Cardiologist:  Mertie Moores, MD   Advanced Practice Provider:  No care team member to display Electrophysiologist:  None   [phone]   Chief Complaint  Patient presents with   Follow-up   Palpitations   Hypertension    History of Present Illness:  Victoria Moore is a 46 y.o. female with history of hypertension, DM,History of chest pain, sarcoidosis, GERD, gastric bypass.  Patient saw Dr. Acie Fredrickson 10/09/2019 with atypical chest pain felt to be musculoskeletal as well as palpitations that could be due to Adderall and albuterol.  He ordered a monitor which showed normal sinus rhythm with frequent loss of lead and available no is seen.  2D echo 02/15/2020 normal LVEF 65 to 70%, NST 02/28/2020 intermediate risk because of mild decreased LV function EF 25% no ischemia. Has been seen at [facility] for chest pain as well.  Patient says BP running high in the evening. Has associated headaches, dizziness, racing heart. Watches salt closely, no caffeine. Used to be on BP meds but taken off after gastric bypass. Not using adderall since took a break from school. Does use her inhalers. 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 Fibromyalgia, doubt sarcoid    Asthma    Blood transfusion without reported diagnosis    Chest wall pain 05/26/2018   Chronic kidney disease    Chronic low back pain with sciatica 09/15/2018   Common migraine with intractable migraine 10/07/2016   Cough variant asthma vs UACS  10/23/2016   - Spirometry 04/15/2016  Very  truncated exp loop effort dep portion only  - Allergy profile 10/22/2016 >  Eos 0.2/  IgE  52 RAST POS grass/trees/ragweed  10/22/2016  After extensive coaching HFA effectiveness =    75% try duelra 100 2bid > improved   Daytime sleepiness 05/26/2018   Decreased pedal pulses 10/04/2018   Depression    Depression, major, single episode, moderate ([place]) 05/26/2018   Diabetes ([place])    Dyspnea 04/15/2016   04/15/2016  Walked RA x 3 laps @ 185 ft each stopped due to  End of study, nl pace, no sob or desat    - Spirometry 04/15/2016  Very truncated exp loop effort dep portion only  - 10/22/2016  Walked RA x 3 laps @ 185 ft each stopped due to  End of study, slow pace, min sob/ no desat - full pfts rec 10/22/2016 >>>       Edema    Essential hypertension, benign 11/15/2016   Fibroids 03/11/2016   Frequency of urination 09/11/2018   GERD (gastroesophageal reflux disease)    Herniated lumbar intervertebral disc    Hiatal hernia    Hilar adenopathy    Hypertension    Hypokalemia 06/28/2018   Impaired fasting blood sugar 09/11/2018   Iron deficiency    Leg pain 12/07/2018   Low libido 11/27/2018   Migraine    Mild sleep apnea 08/03/2018  HST 06/20/18  AHI  8.1 / snoring with 02 nadir 80% >  08/03/2018 rec sleep medicine consultation    Morbid (severe) obesity due to excess calories ([place]) 10/23/2016   Personal history of sarcoidosis 05/26/2018   Prolonged capillary refill time    Right leg swelling 08/18/2018   Sarcoidosis    personal history of   Seizures ([place])    history of    Sleep apnea    Spondylosis    Vitamin D deficiency     Past Surgical History:  Procedure Laterality Date   ABDOMINAL HYSTERECTOMY     ESOPHAGEAL MANOMETRY N/A 09/05/2019   Procedure: ESOPHAGEAL MANOMETRY (EM);  Surgeon: Lavena Bullion, DO;  Location: [facility];  Service: Gastroenterology;  Laterality: N/A;   LAPAROSCOPIC OVARIAN CYSTECTOMY Left 03/11/2016   Procedure: LAPAROSCOPIC OVARIAN CYSTECTOMY;  Surgeon: Eldred Manges, MD;  Location: [facility];  Service: Gynecology;  Laterality: Left;   LAPAROSCOPIC VAGINAL HYSTERECTOMY WITH SALPINGECTOMY Bilateral 03/11/2016   Procedure: LAPAROSCOPIC ASSISTED VAGINAL HYSTERECTOMY WITH SALPINGECTOMY;  Surgeon: Eldred Manges, MD;  Location: [facility];  Service: Gynecology;  Laterality: Bilateral;   [place] IMPEDANCE STUDY  09/05/2019   Procedure: [place] IMPEDANCE STUDY;  Surgeon: Lavena Bullion, DO;  Location: [facility];  Service: Gastroenterology;;   TUBAL LIGATION     UPPER GASTROINTESTINAL ENDOSCOPY  10/11/2019   06/2019    Current Medications: Current Meds  Medication Sig   acetaminophen (TYLENOL) 500 MG tablet Take 1 tablet (500 mg total) by mouth every 6 (six) hours as needed.   albuterol (PROVENTIL) (2.5 MG/3ML) 0.083% nebulizer solution Take 2.5 mg by nebulization every 6 (six) hours as needed for wheezing or shortness of breath.   albuterol (VENTOLIN HFA) 108 (90 Base) MCG/ACT inhaler Inhale 1-2 puffs into the lungs every 4 (four) hours as needed for wheezing or shortness of breath.   ALPRAZolam (XANAX) 0.25 MG tablet Take 1 tablet (0.25 mg total) by mouth 2 (two) times daily as needed for anxiety.   amphetamine-dextroamphetamine (ADDERALL XR) 20 MG 24 hr capsule Take 1 capsule (20 mg total) by mouth as needed.   chlorthalidone (HYGROTON) 25 MG tablet Take 1 tablet (25 mg total) by mouth daily.   dextromethorphan-guaiFENesin (MUCINEX DM) 30-600 MG 12hr tablet Take 1 tablet by mouth 2 (two) times daily.   ferrous sulfate (FER-IN-SOL) 75 (15 Fe) MG/ML SOLN Take 0.8 mLs (60 mg total) by mouth 2 (two) times daily. With food   fluticasone (FLONASE) 50 MCG/ACT nasal spray Place 1 spray into both nostrils 2 (two) times daily.   pantoprazole (PROTONIX) 40 MG tablet Take 1 tablet (40 mg total) by mouth 2 (two) times daily.   potassium chloride SA (KLOR-CON) 20 MEQ tablet Take 1 tablet (20 mEq total) by mouth 2 (two) times daily.   SUMAtriptan (IMITREX) 100 MG tablet TAKE 1  TABLET BY MOUTH 2X DAILY AS NEEDED FOR MIGRAINE. MAY REPEAT IN 2 HOURS IF HEADACHE PERSISTS OR RECURS.   traZODone (DESYREL) 50 MG tablet Take 1 tablet (50 mg total) by mouth at bedtime as needed for sleep.   Current Facility-Administered Medications for the 05/26/22 encounter (Office Visit) with Imogene Burn, PA-C  Medication   nitroGLYCERIN (NITROSTAT) SL tablet 0.4 mg     Allergies:   Aspirin   Social History   Socioeconomic History   Marital status: Significant Other    Spouse name: Not on file   Number of children: 1   Years of education: 11   Highest  education level: Not on file  Occupational History   Occupation: [employer]  Tobacco Use   Smoking status: Former    Packs/day: 0.25    Years: 17.00    Pack years: 4.25    Types: Cigarettes    Quit date: 03/02/2013    Years since quitting: 9.2   Smokeless tobacco: Never  Vaping Use   Vaping Use: Never used  Substance and Sexual Activity   Alcohol use: No    Alcohol/week: 0.0 standard drinks   Drug use: Not Currently    Types: Marijuana    Comment: former - 6 yrs ago   Sexual activity: Yes    Birth control/protection: Surgical  Other Topics Concern   Not on file  Social History Narrative   She lives w/ her fiance'. She has one son.    Highest level of education:  12th grade, did not graduate. Currently back in school.   Right-handed   Caffeine: 1 cup of coffee some mornings      Social History      Diet? No       Do you drink/eat things with caffeine? yes      Marital status?           Single                          What year were you married?       Do you live in a house, apartment, assisted living, condo, trailer, etc.? house      Is it one or more stories?     How many persons live in your home?  2      Do you have any pets in your home? (please list)   No       Highest level of education completed? Some college       Current or past profession:       Do you exercise?       Yes                                 Type & how often? Daily       Advanced Directives      Do you have a living will? No       Do you have a DNR form?                                  If not, do you want to discuss one? No       Do you have signed POA/HPOA for forms?  No       Functional Status      Do you have difficulty bathing or dressing yourself? No       Do you have difficulty preparing food or eating? No       Do you have difficulty managing your medications? No       Do you have difficulty managing your finances? No       Do you have difficulty affording your medications? No       Social Determinants of Radio broadcast assistant Strain: Not on file  Food Insecurity: Not on file  Transportation Needs: Not on file  Physical Activity: Not on file  Stress: Not on file  Social Connections: Not on file  Family History:  The patient's  family history includes Asthma in her mother; COPD in her father and mother; Diabetes in her father; Heart Problems in her father and mother; Heart attack in her father and mother; Migraines in her mother; Other in her son; Peptic Ulcer in her father. She was adopted.   ROS:   Please see the history of present illness.    ROS All other systems reviewed and are negative.   PHYSICAL EXAM:   VS:  BP 126/68   Pulse 62   Ht 6' (1.829 m)   Wt 216 lb 3.2 oz (98.1 kg)   LMP 02/19/2016   SpO2 100%   BMI 29.32 kg/m   Physical Exam  GEN: Well nourished, well developed, in no acute distress  Neck: no JVD, carotid bruits, or masses Cardiac:RRR; no murmurs, rubs, or gallops  Respiratory:  clear to auscultation bilaterally, normal work of breathing GI: soft, nontender, nondistended, + BS Ext: without cyanosis, clubbing, or edema, Good distal pulses bilaterally Neuro:  Alert and Oriented x 3, Psych: euthymic mood, full affect  Wt Readings from Last 3 Encounters:  05/26/22 216 lb 3.2 oz (98.1 kg)  03/09/22 195 lb (88.5 kg)  11/18/21 191 lb (86.6 kg)       Studies/Labs Reviewed:   EKG:  EKG is not ordered today.  The ekgreviewed from 01/21/22 NSR nonspecific ST changes  Recent Labs: 11/08/2021: BUN 12; Creatinine, Ser 0.88; Hemoglobin 12.5; Platelets 169; Potassium 3.9; Sodium 138   Lipid Panel    Component Value Date/Time   CHOL 192 08/22/2019 1540   TRIG 105 08/22/2019 1540   HDL 53 08/22/2019 1540   CHOLHDL 3.6 08/22/2019 1540   VLDL 12.6 06/27/2018 0915   LDLCALC 118 (H) 08/22/2019 1540    Additional studies/ records that were reviewed today include:  NST 02/28/2020 Blood pressure demonstrated a blunted response to exercise. There was no ST segment deviation noted during stress. This is an intermediate risk study. The left ventricular ejection fraction is mildly decreased (45-54%). Nuclear stress EF: 45%. Echo 01/2020 IMPRESSIONS     1. Left ventricular ejection fraction, by estimation, is 65 to 70%. The  left ventricle has normal function. The left ventricle has no regional  wall motion abnormalities. Left ventricular diastolic parameters were  normal.   2. Right ventricular systolic function is normal. The right ventricular  size is normal. There is normal pulmonary artery systolic pressure.   3. The mitral valve is normal in structure and function. No evidence of  mitral valve regurgitation.   4. The aortic valve is normal in structure and function. Aortic valve  regurgitation is not visualized.   FINDINGS   Left Ventricle: Left ventricular ejection fraction, by estimation, is 65  to 70%. The left ventricle has normal function. The left ventricle has no  regional wall motion abnormalities. The left ventricular internal cavity  size was normal in size. There is   no left ventricular hypertrophy. Left ventricular diastolic parameters  were normal.   Right Ventricle: The right ventricular size is normal. No increase in  right ventricular wall thickness. Right ventricular systolic function is  normal. There is  normal pulmonary artery systolic pressure. The tricuspid  regurgitant velocity is 1.93 m/s, and   with an assumed right atrial pressure of 10 mmHg, the estimated right  ventricular systolic pressure is 02.5 mmHg.   Left Atrium: Left atrial size was normal in size.   Right Atrium: Right atrial size was normal in size.  Pericardium: There is no evidence of pericardial effusion.   Mitral Valve: The mitral valve is normal in structure and function. No  evidence of mitral valve regurgitation.   Tricuspid Valve: The tricuspid valve is normal in structure. Tricuspid  valve regurgitation is mild.   Aortic Valve: The aortic valve is normal in structure and function. Aortic  valve regurgitation is not visualized. Aortic valve mean gradient measures  4.5 mmHg. Aortic valve peak gradient measures 8.6 mmHg. Aortic valve area,  by VTI measures 2.23 cm.   Pulmonic Valve: The pulmonic valve was normal in structure. Pulmonic valve  regurgitation is trivial.   Aorta: The aortic root was not well visualized and the aortic root is  normal in size and structure.   IAS/Shunts: No atrial level shunt detected by color flow Doppler.         Risk Assessment/Calculations:         ASSESSMENT:    1. Chest pain, unspecified type   2. Palpitations   3. Essential hypertension   4. Borderline diabetes      PLAN:  In order of problems listed above:  History of chest pain with  intermediate risk NST 2021 because of decreased LV function which was norma LVEF on echo. No recent chest pain  Palpitations-mostly occur in the evening. Will place Zio, check labs including CBC, CMET, TSH, FLP  HTN-BP running high in the evening-used to take hygroton 25 mg daily but stopped after gastric bypass. Will restart and check labs today  DM-hasn't had check in a long time-check A1C     Shared Decision Making/Informed Consent        Medication Adjustments/Labs and Tests Ordered: Current medicines  are reviewed at length with the patient today.  Concerns regarding medicines are outlined above.  Medication changes, Labs and Tests ordered today are listed in the Patient Instructions below. Patient Instructions  Medication Instructions:  Your physician has recommended you make the following change in your medication:   START: Chlorthalidone '25mg'$  daily  *If you need a refill on your cardiac medications before your next appointment, please call your pharmacy*   Lab Work: TODAY: CMET, CBC, FLP, TSH, A1C  If you have labs (blood work) drawn today and your tests are completely normal, you will receive your results only by: [place] (if you have MyChart) OR A paper copy in the mail If you have any lab test that is abnormal or we need to change your treatment, we will call you to review the results.   Follow-Up: At [facility], you and your health needs are our priority.  As part of our continuing mission to provide you with exceptional heart care, we have created designated Provider Care Teams.  These Care Teams include your primary Cardiologist (physician) and Advanced Practice Providers (APPs -  Physician Assistants and Nurse Practitioners) who all work together to provide you with the care you need, when you need it.  Your next appointment:   1 year(s)  The format for your next appointment:   In Person  Provider:   Mertie Moores, MD     Other Instructions  ZIO XT- Long Term Monitor Instructions  Your physician has requested you wear a ZIO patch monitor for 14 days.  This is a single patch monitor. [employer] supplies one patch monitor per enrollment. Additional stickers are not available. Please do not apply patch if you will be having a Nuclear Stress Test,  Echocardiogram, Cardiac CT, MRI, or Chest Xray during the  period you would be wearing the  monitor. The patch cannot be worn during these tests. You cannot remove and re-apply the  ZIO XT patch monitor.  Your ZIO  patch monitor will be mailed 3 day USPS to your address on file. It may take 3-5 days  to receive your monitor after you have been enrolled.  Once you have received your monitor, please review the enclosed instructions. Your monitor  has already been registered assigning a specific monitor serial # to you.  Billing and Patient Assistance Program Information  We have supplied [employer] with any of your insurance information on file for billing purposes. [employer] offers a sliding scale Patient Assistance Program for patients that do not have  insurance, or whose insurance does not completely cover the cost of the ZIO monitor.  You must apply for the Patient Assistance Program to qualify for this discounted rate.  To apply, please call [employer] at [phone], select option 4, select option 2, ask to apply for  Patient Assistance Program. Theodore Demark will ask your household income, and how many people  are in your household. They will quote your out-of-pocket cost based on that information.  [employer] will also be able to set up a 53-month interest-free payment plan if needed.  Applying the monitor   Shave hair from upper left chest.  Hold abrader disc by orange tab. Rub abrader in 40 strokes over the upper left chest as  indicated in your monitor instructions.  Clean area with 4 enclosed alcohol pads. Let dry.  Apply patch as indicated in monitor instructions. Patch will be placed under collarbone on left  side of chest with arrow pointing upward.  Rub patch adhesive wings for 2 minutes. Remove white label marked "1". Remove the white  label marked "2". Rub patch adhesive wings for 2 additional minutes.  While looking in a mirror, press and release button in center of patch. A small green light will  flash 3-4 times. This will be your only indicator that the monitor has been turned on.  Do not shower for the first 24 hours. You may shower after the first 24 hours.  Press the button if you feel a  symptom. You will hear a small click. Record Date, Time and  Symptom in the Patient Logbook.  When you are ready to remove the patch, follow instructions on the last 2 pages of Patient  Logbook. Stick patch monitor onto the last page of Patient Logbook.  Place Patient Logbook in the blue and white box. Use locking tab on box and tape box closed  securely. The blue and white box has prepaid postage on it. Please place it in the mailbox as  soon as possible. Your physician should have your test results approximately 7 days after the  monitor has [facility]  Call [place] [phone] you have questions regarding  your ZIO XT patch monitor. Call them immediately if you see an orange light blinking on your  monitor.  If your monitor falls off in less than 4 days, contact our Monitor department at [phone]  If your monitor becomes loose or falls off after 4 days call [employer] at [phone]  suggestions on securing your monitor      Signed, MErmalinda Barrios PHershal Coria 05/26/2022 9:55 AM    [place]  [id number]: [phone] Fax: [phone]

## 2022-05-26 ENCOUNTER — Encounter: Payer: Self-pay | Admitting: Physician Assistant

## 2022-05-26 ENCOUNTER — Ambulatory Visit (INDEPENDENT_AMBULATORY_CARE_PROVIDER_SITE_OTHER): Payer: 59 | Admitting: Physician Assistant

## 2022-05-26 ENCOUNTER — Ambulatory Visit: Payer: Self-pay | Admitting: Allergy & Immunology

## 2022-05-26 ENCOUNTER — Ambulatory Visit: Payer: 59

## 2022-05-26 VITALS — BP 126/68 | HR 62 | Ht 72.0 in | Wt 216.2 lb

## 2022-05-26 DIAGNOSIS — R079 Chest pain, unspecified: Secondary | ICD-10-CM

## 2022-05-26 DIAGNOSIS — I10 Essential (primary) hypertension: Secondary | ICD-10-CM | POA: Diagnosis not present

## 2022-05-26 DIAGNOSIS — R002 Palpitations: Secondary | ICD-10-CM

## 2022-05-26 DIAGNOSIS — R7303 Prediabetes: Secondary | ICD-10-CM

## 2022-05-26 LAB — CBC
Hematocrit: 37.9 % (ref 34.0–46.6)
Hemoglobin: 12.7 g/dL (ref 11.1–15.9)
MCH: 29.5 pg (ref 26.6–33.0)
MCHC: 33.5 g/dL (ref 31.5–35.7)
MCV: 88 fL (ref 79–97)
Platelets: 191 10*3/uL (ref 150–450)
RBC: 4.31 x10E6/uL (ref 3.77–5.28)
RDW: 12.5 % (ref 11.7–15.4)
WBC: 3.6 10*3/uL (ref 3.4–10.8)

## 2022-05-26 LAB — COMPREHENSIVE METABOLIC PANEL
ALT: 9 IU/L (ref 0–32)
AST: 14 IU/L (ref 0–40)
Albumin/Globulin Ratio: 2.1 (ref 1.2–2.2)
Albumin: 4.5 g/dL (ref 3.8–4.8)
Alkaline Phosphatase: 90 IU/L (ref 44–121)
BUN/Creatinine Ratio: 13 (ref 9–23)
BUN: 14 mg/dL (ref 6–24)
Bilirubin Total: 0.2 mg/dL (ref 0.0–1.2)
CO2: 24 mmol/L (ref 20–29)
Calcium: 8.7 mg/dL (ref 8.7–10.2)
Chloride: 105 mmol/L (ref 96–106)
Creatinine, Ser: 1.08 mg/dL — ABNORMAL HIGH (ref 0.57–1.00)
Globulin, Total: 2.1 g/dL (ref 1.5–4.5)
Glucose: 88 mg/dL (ref 70–99)
Potassium: 4 mmol/L (ref 3.5–5.2)
Sodium: 143 mmol/L (ref 134–144)
Total Protein: 6.6 g/dL (ref 6.0–8.5)
eGFR: 63 mL/min/{1.73_m2} (ref >59)

## 2022-05-26 LAB — LIPID PANEL
Chol/HDL Ratio: 2.9 ratio (ref 0.0–4.4)
Cholesterol, Total: 182 mg/dL (ref 100–199)
HDL: 62 mg/dL (ref >39)
LDL Chol Calc (NIH): 104 mg/dL — ABNORMAL HIGH (ref 0–99)
Triglycerides: 87 mg/dL (ref 0–149)
VLDL Cholesterol Cal: 16 mg/dL (ref 5–40)

## 2022-05-26 LAB — TSH: TSH: 1.59 u[IU]/mL (ref 0.450–4.500)

## 2022-05-26 LAB — HEMOGLOBIN A1C
Est. average glucose Bld gHb Est-mCnc: 114 mg/dL
Hgb A1c MFr Bld: 5.6 % (ref 4.8–5.6)

## 2022-05-26 MED ORDER — CHLORTHALIDONE 25 MG PO TABS: 25.0000 mg | ORAL_TABLET | Freq: Every day | ORAL | 3 refills | Status: DC

## 2022-05-26 NOTE — Patient Instructions (Signed)
Medication Instructions:  Your physician has recommended you make the following change in your medication:   START: Chlorthalidone '25mg'$  daily  *If you need a refill on your cardiac medications before your next appointment, please call your pharmacy*   Lab Work: TODAY: CMET, CBC, FLP, TSH, A1C  If you have labs (blood work) drawn today and your tests are completely normal, you will receive your results only by: [place] (if you have MyChart) OR A paper copy in the mail If you have any lab test that is abnormal or we need to change your treatment, we will call you to review the results.   Follow-Up: At [facility], you and your health needs are our priority.  As part of our continuing mission to provide you with exceptional heart care, we have created designated Provider Care Teams.  These Care Teams include your primary Cardiologist (physician) and Advanced Practice Providers (APPs -  Physician Assistants and Nurse Practitioners) who all work together to provide you with the care you need, when you need it.  Your next appointment:   1 year(s)  The format for your next appointment:   In Person  Provider:   Mertie Moores, MD     Other Instructions  ZIO XT- Long Term Monitor Instructions  Your physician has requested you wear a ZIO patch monitor for 14 days.  This is a single patch monitor. [employer] supplies one patch monitor per enrollment. Additional stickers are not available. Please do not apply patch if you will be having a Nuclear Stress Test,  Echocardiogram, Cardiac CT, MRI, or Chest Xray during the period you would be wearing the  monitor. The patch cannot be worn during these tests. You cannot remove and re-apply the  ZIO XT patch monitor.  Your ZIO patch monitor will be mailed 3 day USPS to your address on file. It may take 3-5 days  to receive your monitor after you have been enrolled.  Once you have received your monitor, please review the enclosed  instructions. Your monitor  has already been registered assigning a specific monitor serial # to you.  Billing and Patient Assistance Program Information  We have supplied [employer] with any of your insurance information on file for billing purposes. [employer] offers a sliding scale Patient Assistance Program for patients that do not have  insurance, or whose insurance does not completely cover the cost of the ZIO monitor.  You must apply for the Patient Assistance Program to qualify for this discounted rate.  To apply, please call [employer] at [phone], select option 4, select option 2, ask to apply for  Patient Assistance Program. Theodore Demark will ask your household income, and how many people  are in your household. They will quote your out-of-pocket cost based on that information.  [employer] will also be able to set up a 75-month interest-free payment plan if needed.  Applying the monitor   Shave hair from upper left chest.  Hold abrader disc by orange tab. Rub abrader in 40 strokes over the upper left chest as  indicated in your monitor instructions.  Clean area with 4 enclosed alcohol pads. Let dry.  Apply patch as indicated in monitor instructions. Patch will be placed under collarbone on left  side of chest with arrow pointing upward.  Rub patch adhesive wings for 2 minutes. Remove white label marked "1". Remove the white  label marked "2". Rub patch adhesive wings for 2 additional minutes.  While looking in a mirror, press and release button  in center of patch. A small green light will  flash 3-4 times. This will be your only indicator that the monitor has been turned on.  Do not shower for the first 24 hours. You may shower after the first 24 hours.  Press the button if you feel a symptom. You will hear a small click. Record Date, Time and  Symptom in the Patient Logbook.  When you are ready to remove the patch, follow instructions on the last 2 pages of Patient  Logbook. Stick patch  monitor onto the last page of Patient Logbook.  Place Patient Logbook in the blue and white box. Use locking tab on box and tape box closed  securely. The blue and white box has prepaid postage on it. Please place it in the mailbox as  soon as possible. Your physician should have your test results approximately 7 days after the  monitor has been mailed back to [facility].  Call [place] at [phone] if you have questions regarding  your ZIO XT patch monitor. Call them immediately if you see an orange light blinking on your  monitor.  If your monitor falls off in less than 4 days, contact our Monitor department at [phone].  If your monitor becomes loose or falls off after 4 days call [employer] at [phone] for  suggestions on securing your monitor

## 2022-05-26 NOTE — Progress Notes (Unsigned)
Enrolled patient for a 14 day Zio XT monitor to be mailed to patients home   Dr Acie Fredrickson to read

## 2022-05-27 ENCOUNTER — Telehealth: Payer: Self-pay

## 2022-05-27 DIAGNOSIS — R002 Palpitations: Secondary | ICD-10-CM

## 2022-05-27 NOTE — Telephone Encounter (Signed)
-----   Message from Imogene Burn, PA-C sent at 05/27/2022  8:06 AM EDT ----- Kidney function mildly elevated. Increase water intake. LDL above goal of less than 90. Reduce saturated fats/processed foods in diet. A1C looks good 5.6, other labs normal. Repeat bmet in 2 weeks. thanks

## 2022-05-27 NOTE — Telephone Encounter (Signed)
Reviewed lab results and recommendations with patient. Patient verbalized understanding. She will obtain repeat Bmet in 2 weeks at [facility] in [place].

## 2022-06-03 ENCOUNTER — Ambulatory Visit: Payer: 59 | Admitting: Nurse Practitioner

## 2022-06-11 ENCOUNTER — Ambulatory Visit: Payer: Self-pay

## 2022-06-15 ENCOUNTER — Ambulatory Visit
Admission: RE | Admit: 2022-06-15 | Discharge: 2022-06-15 | Disposition: A | Discharge status: Home / Self Care | Payer: 59 | Source: Ambulatory Visit | Attending: Nurse Practitioner | Admitting: Nurse Practitioner

## 2022-06-15 ENCOUNTER — Other Ambulatory Visit: Payer: Self-pay

## 2022-06-15 VITALS — BP 131/76 | HR 60 | Temp 98.4°F | Resp 12

## 2022-06-15 DIAGNOSIS — R109 Unspecified abdominal pain: Secondary | ICD-10-CM | POA: Diagnosis not present

## 2022-06-15 DIAGNOSIS — M545 Low back pain, unspecified: Secondary | ICD-10-CM

## 2022-06-15 DIAGNOSIS — G8929 Other chronic pain: Secondary | ICD-10-CM | POA: Diagnosis not present

## 2022-06-15 DIAGNOSIS — R110 Nausea: Secondary | ICD-10-CM

## 2022-06-15 LAB — POCT URINALYSIS DIP (MANUAL ENTRY)
Blood, UA: NEGATIVE
Glucose, UA: NEGATIVE mg/dL
Ketones, POC UA: NEGATIVE mg/dL
Leukocytes, UA: NEGATIVE
Nitrite, UA: NEGATIVE
Protein Ur, POC: 30 mg/dL — AB
Spec Grav, UA: >=1.03 — AB (ref 1.010–1.025)
Urobilinogen, UA: 0.2 E.U./dL
pH, UA: 6 (ref 5.0–8.0)

## 2022-06-15 MED ORDER — ACETAMINOPHEN ER 650 MG PO TBCR: 650.0000 mg | EXTENDED_RELEASE_TABLET | Freq: Three times a day (TID) | ORAL | 0 refills | Status: DC | PRN

## 2022-06-15 MED ORDER — ESOMEPRAZOLE MAGNESIUM 40 MG PO CPDR: 40.0000 mg | DELAYED_RELEASE_CAPSULE | Freq: Every day | ORAL | 0 refills | Status: DC

## 2022-06-15 MED ORDER — ONDANSETRON 4 MG PO TBDP: 4.0000 mg | ORAL_TABLET | Freq: Three times a day (TID) | ORAL | 0 refills | Status: DC | PRN

## 2022-06-15 NOTE — ED Triage Notes (Signed)
Patient c/o lower back pain and generalized ABD pain x 4 days.   Patient endorses ABD pain occurs mostly after eating. Patient endorses nausea after eating.   Patient endorses urinary frequency.   Patient denies fall, trauma, or picking up a heavy item.   Patient denies diarrhea or constipation.    History of gastric bypass (2021) per patient statement.    Patient has taken pepcid, pepto bismol, and protonix with no relief of symptoms.

## 2022-06-15 NOTE — ED Provider Notes (Signed)
[facility]    CSN: [id number] Arrival date & time: 06/15/22  0919      History   Chief Complaint Chief Complaint  Patient presents with   Back Pain   Abdominal Pain   APPT 0930    HPI Victoria Moore is a 46 y.o. female.    Back Pain Associated symptoms: abdominal pain   Abdominal Pain   Patient presents with 4-day history of abdominal pain with nausea.  Patient states the nausea starts approximately 30 minutes after she eats, and last for approximately 30 minutes.  Nausea is only present after she eats.  She complains of pain in her back also, which is chronic.  She denies fever, chills, chest pain, diarrhea, constipation, or vomiting.  Patient states she has been taking Pepto-Bismol, Protonix, and Pepcid for her symptoms with no relief.  Patient has a history of gastric bypass approximately 2 years ago.  She states at that time she was diagnosed with reflux, but states her symptoms have improved.  She also complains of urinary frequency.   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 Fibromyalgia, doubt sarcoid    Asthma    Blood transfusion without reported diagnosis    Chest wall pain 05/26/2018   Chronic kidney disease    Chronic low back pain with sciatica 09/15/2018   Common migraine with intractable migraine 10/07/2016   Cough variant asthma vs UACS  10/23/2016   - Spirometry 04/15/2016  Very truncated exp loop effort dep portion only  - Allergy profile 10/22/2016 >  Eos 0.2/  IgE  52 RAST POS grass/trees/ragweed  10/22/2016  After extensive coaching HFA effectiveness =    75% try duelra 100 2bid > improved   Daytime sleepiness 05/26/2018   Decreased pedal pulses 10/04/2018   Depression    Depression, major, single episode, moderate ([place]) 05/26/2018   Diabetes ([place])     Dyspnea 04/15/2016   04/15/2016  Walked RA x 3 laps @ 185 ft each stopped due to  End of study, nl pace, no sob or desat    - Spirometry 04/15/2016  Very truncated exp loop effort dep portion only  - 10/22/2016  Walked RA x 3 laps @ 185 ft each stopped due to  End of study, slow pace, min sob/ no desat - full pfts rec 10/22/2016 >>>       Edema    Essential hypertension, benign 11/15/2016   Fibroids 03/11/2016   Frequency of urination 09/11/2018   GERD (gastroesophageal reflux disease)    Herniated lumbar intervertebral disc    Hiatal hernia    Hilar adenopathy    Hypertension    Hypokalemia 06/28/2018   Impaired fasting blood sugar 09/11/2018   Iron deficiency    Leg pain 12/07/2018   Low libido 11/27/2018   Migraine    Mild sleep apnea 08/03/2018   HST 06/20/18  AHI  8.1 / snoring with 02 nadir 80% >  08/03/2018 rec sleep medicine consultation    Morbid (severe) obesity due to excess calories ([place]) 10/23/2016   Personal history of sarcoidosis 05/26/2018   Prolonged capillary refill time    Right leg swelling 08/18/2018   Sarcoidosis    personal history of  Seizures ([place])    history of    Sleep apnea    Spondylosis    Vitamin D deficiency     Patient Active Problem List   Diagnosis Date Noted   Asthmatic bronchitis 01/23/2021   COVID-19 virus infection 01/23/2021   Social anxiety disorder 10/03/2020   Attention deficit hyperactivity disorder (ADHD), 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   Mild episode of recurrent major depressive disorder ([place]) 10/03/2020   Constipation 05/20/2020   S/P gastric bypass 05/12/2020   Intractable abdominal pain 04/27/2020   Benign intracranial hypertension 02/14/2020   Myopia of both eyes 02/14/2020   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   BMI 45.0-49.9, adult ([place]) 11/29/2019   Other intervertebral disc degeneration, lumbar region 09/19/2019   Complex cyst of left ovary 02/01/2019   Sarcoidosis 02/01/2019   Leg pain 12/07/2018   Low libido  11/27/2018   Prolonged capillary refill time 10/04/2018   Decreased pedal pulses 10/04/2018   Spondylosis 09/15/2018   Chronic low back pain with sciatica 09/15/2018   Impaired fasting blood sugar 09/11/2018   Weight gain 09/11/2018   Mild sleep apnea 08/03/2018   Hypokalemia 06/28/2018   Medication side effect 06/22/2018   Acute low back pain without sciatica 06/22/2018   Personal history of sarcoidosis 05/26/2018   Vitamin D deficiency 05/26/2018   Encounter for health maintenance examination with abnormal findings 05/26/2018   Depression, major, single episode, moderate ([place]) 05/26/2018   Daytime sleepiness 05/26/2018   Chest pain of uncertain etiology 25/36/6440   GERD (gastroesophageal reflux disease) 05/26/2018   Essential hypertension, benign 11/15/2016   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/10/2016   Morbid (severe) obesity due to excess calories ([place]) 10/23/2016   Common migraine with intractable migraine 10/07/2016   DOE (dyspnea on exertion) 04/15/2016   Hilar adenopathy 04/15/2016   Seizures ([place]) 03/11/2016   Anemia 11/12/2014   Iron deficiency 11/12/2014    Past Surgical History:  Procedure Laterality Date   ABDOMINAL HYSTERECTOMY     ESOPHAGEAL MANOMETRY N/A 09/05/2019   Procedure: ESOPHAGEAL MANOMETRY (EM);  Surgeon: Lavena Bullion, DO;  Location: [facility];  Service: Gastroenterology;  Laterality: N/A;   LAPAROSCOPIC OVARIAN CYSTECTOMY Left 03/11/2016   Procedure: LAPAROSCOPIC OVARIAN CYSTECTOMY;  Surgeon: Eldred Manges, MD;  Location: [facility];  Service: Gynecology;  Laterality: Left;   LAPAROSCOPIC VAGINAL HYSTERECTOMY WITH SALPINGECTOMY Bilateral 03/11/2016   Procedure: LAPAROSCOPIC ASSISTED VAGINAL HYSTERECTOMY WITH SALPINGECTOMY;  Surgeon: Eldred Manges, MD;  Location: [facility];  Service: Gynecology;  Laterality: Bilateral;   [place] IMPEDANCE STUDY  09/05/2019   Procedure: [place] IMPEDANCE STUDY;  Surgeon: Lavena Bullion, DO;  Location: [facility];  Service:  Gastroenterology;;   TUBAL LIGATION     UPPER GASTROINTESTINAL ENDOSCOPY  10/11/2019   06/2019    OB History     Gravida  1   Para      Term      Preterm      AB      Living         SAB      IAB      Ectopic      Multiple      Live Births               Home Medications    Prior to Admission medications   Medication Sig Start Date End Date Taking? Authorizing Provider  chlorthalidone (HYGROTON) 25 MG tablet Take 1 tablet (25 mg total) by mouth daily. 05/26/22  Yes Ermalinda Barrios  M, PA-C  esomeprazole (NEXIUM) 40 MG capsule Take 1 capsule (40 mg total) by mouth daily. 06/15/22  Yes Buna Cuppett-Warren, Alda Lea, NP  ferrous sulfate (FER-IN-SOL) 75 (15 Fe) MG/ML SOLN Take 0.8 mLs (60 mg total) by mouth 2 (two) times daily. With food 07/22/20 06/15/22 Yes Esterwood, Amy S, PA-C  ondansetron (ZOFRAN-ODT) 4 MG disintegrating tablet Take 1 tablet (4 mg total) by mouth every 8 (eight) hours as needed for nausea or vomiting. 06/15/22  Yes Domenick Quebedeaux-Warren, Alda Lea, NP  pantoprazole (PROTONIX) 40 MG tablet Take 1 tablet (40 mg total) by mouth 2 (two) times daily. 10/01/21 06/15/22 Yes Lamptey, Myrene Galas, MD  potassium chloride SA (KLOR-CON) 20 MEQ tablet Take 1 tablet (20 mEq total) by mouth 2 (two) times daily. 07/23/20  Yes Esterwood, Amy S, PA-C  QUEtiapine (SEROQUEL) 25 MG tablet Take 25 mg by mouth at bedtime. 05/29/22  Yes [provider]  acetaminophen (TYLENOL) 500 MG tablet Take 1 tablet (500 mg total) by mouth every 6 (six) hours as needed. 03/09/22   Varney Biles, MD  albuterol (PROVENTIL) (2.5 MG/3ML) 0.083% nebulizer solution Take 2.5 mg by nebulization every 6 (six) hours as needed for wheezing or shortness of breath.    [provider]  albuterol (VENTOLIN HFA) 108 (90 Base) MCG/ACT inhaler Inhale 1-2 puffs into the lungs every 4 (four) hours as needed for wheezing or shortness of breath. 01/23/21   Parrett, Fonnie Mu, NP  ALPRAZolam (XANAX) 0.25 MG tablet Take  1 tablet (0.25 mg total) by mouth 2 (two) times daily as needed for anxiety. 10/03/20   Salley Slaughter, NP  amphetamine-dextroamphetamine (ADDERALL XR) 20 MG 24 hr capsule Take 1 capsule (20 mg total) by mouth as needed. 12/17/20   Nwoko, Terese Door, PA  dextromethorphan-guaiFENesin (MUCINEX DM) 30-600 MG 12hr tablet Take 1 tablet by mouth 2 (two) times daily. 01/01/21   Hazel Sams, PA-C  fluticasone (FLONASE) 50 MCG/ACT nasal spray Place 1 spray into both nostrils 2 (two) times daily. 02/01/22   Volney American, PA-C  rizatriptan (MAXALT) 10 MG tablet Take 10 mg by mouth as needed for migraine. May repeat in 2 hours if needed    [provider]  sucralfate (CARAFATE) 1 g tablet Take 1 tablet (1 g total) by mouth 4 (four) times daily -  with meals and at bedtime. Patient not taking: Reported on 05/26/2022 10/01/21   Chase Picket, MD  SUMAtriptan (IMITREX) 100 MG tablet TAKE 1 TABLET BY MOUTH 2X DAILY AS NEEDED FOR MIGRAINE. MAY REPEAT IN 2 HOURS IF HEADACHE PERSISTS OR RECURS. 12/11/19   Kathrynn Ducking, MD  traMADol (ULTRAM) 50 MG tablet Take 50 mg by mouth every 6 (six) hours as needed for moderate pain.  Patient not taking: Reported on 05/26/2022 03/27/20   [provider]  traZODone (DESYREL) 50 MG tablet Take 1 tablet (50 mg total) by mouth at bedtime as needed for sleep. 10/03/20   Salley Slaughter, NP    Family History Family History  Adopted: Yes  Problem Relation Age of Onset   Other Son        Growing pains   Asthma Mother    Heart Problems Mother    Migraines Mother    COPD Mother    Heart attack Mother    Diabetes Father    Peptic Ulcer Father    Heart Problems Father    COPD Father    Heart attack Father    Colon cancer  Neg Hx    Colon polyps Neg Hx    Esophageal cancer Neg Hx    Rectal cancer Neg Hx    Stomach cancer Neg Hx     Social History Social History   Tobacco Use   Smoking status: Former    Packs/day: 0.25    Years:  17.00    Total pack years: 4.25    Types: Cigarettes    Quit date: 03/02/2013    Years since quitting: 9.2   Smokeless tobacco: Never  Vaping Use   Vaping Use: Never used  Substance Use Topics   Alcohol use: Not Currently   Drug use: Not Currently    Types: Marijuana    Comment: former - 6 yrs ago     Allergies   Aspirin   Review of Systems Review of Systems  Gastrointestinal:  Positive for abdominal pain.  Musculoskeletal:  Positive for back pain.   Per HPI  Physical Exam Triage Vital Signs ED Triage Vitals  Enc Vitals Group     BP 06/15/22 0936 131/76     Pulse Rate 06/15/22 0936 60     Resp 06/15/22 0936 12     Temp 06/15/22 0936 98.4 F (36.9 C)     Temp Source 06/15/22 0936 Oral     SpO2 06/15/22 0936 100 %     Weight --      Height --      Head Circumference --      Peak Flow --      Pain Score 06/15/22 0942 10     Pain Loc --      Pain Edu? --      Excl. in [place]? --    No data found.  Updated Vital Signs BP 131/76 (BP Location: Right Arm)   Pulse 60   Temp 98.4 F (36.9 C) (Oral)   Resp 12   LMP 02/19/2016   SpO2 100%   Visual Acuity Right Eye Distance:   Left Eye Distance:   Bilateral Distance:    Right Eye Near:   Left Eye Near:    Bilateral Near:     Physical Exam Vitals and nursing note reviewed.  Constitutional:      General: She is not in acute distress.    Appearance: Normal appearance.  HENT:     Head: Normocephalic.  Eyes:     Extraocular Movements: Extraocular movements intact.     Conjunctiva/sclera: Conjunctivae normal.     Pupils: Pupils are equal, round, and reactive to light.  Cardiovascular:     Rate and Rhythm: Normal rate and regular rhythm.     Pulses: Normal pulses.     Heart sounds: Normal heart sounds.  Pulmonary:     Effort: Pulmonary effort is normal.     Breath sounds: Normal breath sounds.  Abdominal:     General: Bowel sounds are normal. There is no distension.     Palpations: Abdomen is soft.      Tenderness: There is abdominal tenderness in the right upper quadrant, right lower quadrant, epigastric area and left upper quadrant. There is no right CVA tenderness, left CVA tenderness, guarding or rebound.  Musculoskeletal:     Cervical back: Normal range of motion.     Lumbar back: Tenderness present. No swelling or deformity. Decreased range of motion.  Skin:    General: Skin is warm and dry.  Neurological:     General: No focal deficit present.     Mental Status: She is  alert and oriented to person, place, and time.  Psychiatric:        Mood and Affect: Mood normal.        Behavior: Behavior normal.      UC Treatments / Results  Labs (all labs ordered are listed, but only abnormal results are displayed) Labs Reviewed  POCT URINALYSIS DIP (MANUAL ENTRY) - Abnormal; Notable for the following components:      Result Value   Bilirubin, UA small (*)    Spec Grav, UA >=1.030 (*)    Protein Ur, POC =30 (*)    All other components within normal limits    EKG   Radiology No results found.  Procedures Procedures (including critical care time)  Medications Ordered in UC Medications - No data to display  Initial Impression / Assessment and Plan / UC Course  I have reviewed the triage vital signs and the nursing notes.  Pertinent labs & imaging results that were available during my care of the patient were reviewed by me and considered in my medical decision making (see chart for details).  Patient presents with a 4-day history of abdominal pain with nausea.  Nausea presents after eating.  Patient states nausea presents approximately 30 minutes after eating and last approximately 30 minutes.  On exam, she has right upper quadrant, right lower quadrant, epigastric, and left upper quadrant tenderness.  She has no rebound tenderness or guarding at this time.  Her vital signs are stable, she is in no acute distress.  Urinalysis was performed which was negative for signs of  infection, will send a urine culture.  Patient also complains of chronic back pain.  With regard to her abdomen, will prescribe Zofran to help with her nausea and change the Protonix to Nexium to see if this helps with her symptoms.  Patient's recent lab work on 05/26/2022 was reviewed, which was unremarkable.  There are no abnormalities of her liver function test.  Patient does not present with any red flag symptoms at this time.  Patient most likely would benefit from a visit with her primary care physician for further evaluation and further work-up.  With regard to her back symptoms, because this is a chronic condition, recommended orthopedics for the patient.  Patient was provided information for [facility].  Supportive care recommendations were provided to the patient, along with indications of when to go to the emergency department.  Patient advised to follow-up as needed. Final Clinical Impressions(s) / UC Diagnoses   Final diagnoses:  Abdominal pain, unspecified abdominal location  Nausea without vomiting  Chronic low back pain without sciatica, unspecified back pain laterality     Discharge Instructions      Urine culture is pending.  You will be contacted if the results are positive to provide treatment. Take medication as prescribed.  I would like for you to try Nexium instead of the Protonix to see if this helps with your symptoms. Recommend changing your diet to a bland diet.  Recommend trying bananas, rice, applesauce, toast, Jell-O, yogurt, soups, broths to see if your symptoms improve. May take Tylenol as needed for abdominal pain or discomfort. As discussed, to determine the true cause of your symptoms, further evaluation is necessary, please follow-up with your primary care physician within the next 7 to 10 days or sooner if possible. Go to the emergency department if you develop fever, chills, worsening abdominal pain with nausea and vomiting that you cannot stop, or  other concerns.  For  your back, try to stay as active as possible. Apply ice or heat as needed.  Apply ice for pain or swelling, heat for spasm or stiffness.  Apply for 20 minutes, remove for 1 hour, then repeat. Recommend following up with [facility].  You can contact them at [phone]. Follow-up as needed.      ED Prescriptions     Medication Sig Dispense Auth. Provider   esomeprazole (NEXIUM) 40 MG capsule Take 1 capsule (40 mg total) by mouth daily. 30 capsule Latarra Eagleton-Warren, Alda Lea, NP   ondansetron (ZOFRAN-ODT) 4 MG disintegrating tablet Take 1 tablet (4 mg total) by mouth every 8 (eight) hours as needed for nausea or vomiting. 20 tablet Naara Kelty-Warren, Alda Lea, NP      PDMP not reviewed this encounter.   Tish Men, NP 06/15/22 1029

## 2022-06-15 NOTE — Discharge Instructions (Signed)
Urine culture is pending.  You will be contacted if the results are positive to provide treatment. Take medication as prescribed.  I would like for you to try Nexium instead of the Protonix to see if this helps with your symptoms. Recommend changing your diet to a bland diet.  Recommend trying bananas, rice, applesauce, toast, Jell-O, yogurt, soups, broths to see if your symptoms improve. May take Tylenol as needed for abdominal pain or discomfort. As discussed, to determine the true cause of your symptoms, further evaluation is necessary, please follow-up with your primary care physician within the next 7 to 10 days or sooner if possible. Go to the emergency department if you develop fever, chills, worsening abdominal pain with nausea and vomiting that you cannot stop, or other concerns.  For your back, try to stay as active as possible. Apply ice or heat as needed.  Apply ice for pain or swelling, heat for spasm or stiffness.  Apply for 20 minutes, remove for 1 hour, then repeat. Recommend following up with [facility].  You can contact them at [phone]. Follow-up as needed.

## 2022-06-16 LAB — URINE CULTURE: Culture: <10000 — AB

## 2022-07-21 ENCOUNTER — Ambulatory Visit: Payer: Medicaid Other | Admitting: Allergy & Immunology

## 2022-08-13 ENCOUNTER — Ambulatory Visit: Payer: Self-pay

## 2022-09-10 ENCOUNTER — Ambulatory Visit: Payer: Self-pay

## 2022-09-12 ENCOUNTER — Ambulatory Visit: Payer: Self-pay

## 2022-09-22 ENCOUNTER — Encounter: Payer: Self-pay | Admitting: Internal Medicine

## 2022-09-22 ENCOUNTER — Ambulatory Visit (INDEPENDENT_AMBULATORY_CARE_PROVIDER_SITE_OTHER): Payer: 59 | Admitting: Internal Medicine

## 2022-09-22 VITALS — BP 120/78 | HR 81 | Ht 72.0 in | Wt 235.0 lb

## 2022-09-22 DIAGNOSIS — G43019 Migraine without aura, intractable, without status migrainosus: Secondary | ICD-10-CM

## 2022-09-22 DIAGNOSIS — Z0001 Encounter for general adult medical examination with abnormal findings: Secondary | ICD-10-CM

## 2022-09-22 DIAGNOSIS — Z0000 Encounter for general adult medical examination without abnormal findings: Secondary | ICD-10-CM

## 2022-09-22 DIAGNOSIS — J4531 Mild persistent asthma with (acute) exacerbation: Secondary | ICD-10-CM

## 2022-09-22 DIAGNOSIS — R7301 Impaired fasting glucose: Secondary | ICD-10-CM | POA: Diagnosis not present

## 2022-09-22 DIAGNOSIS — F4010 Social phobia, unspecified: Secondary | ICD-10-CM

## 2022-09-22 DIAGNOSIS — G4726 Circadian rhythm sleep disorder, shift work type: Secondary | ICD-10-CM

## 2022-09-22 DIAGNOSIS — F900 Attention-deficit hyperactivity disorder, predominantly inattentive type: Secondary | ICD-10-CM | POA: Diagnosis not present

## 2022-09-22 DIAGNOSIS — L299 Pruritus, unspecified: Secondary | ICD-10-CM

## 2022-09-22 DIAGNOSIS — Z23 Encounter for immunization: Secondary | ICD-10-CM | POA: Diagnosis not present

## 2022-09-22 DIAGNOSIS — D869 Sarcoidosis, unspecified: Secondary | ICD-10-CM | POA: Diagnosis not present

## 2022-09-22 DIAGNOSIS — Z8669 Personal history of other diseases of the nervous system and sense organs: Secondary | ICD-10-CM

## 2022-09-22 DIAGNOSIS — I10 Essential (primary) hypertension: Secondary | ICD-10-CM | POA: Diagnosis not present

## 2022-09-22 DIAGNOSIS — R69 Illness, unspecified: Secondary | ICD-10-CM | POA: Diagnosis not present

## 2022-09-22 DIAGNOSIS — M5440 Lumbago with sciatica, unspecified side: Secondary | ICD-10-CM

## 2022-09-22 DIAGNOSIS — R569 Unspecified convulsions: Secondary | ICD-10-CM | POA: Diagnosis not present

## 2022-09-22 DIAGNOSIS — K219 Gastro-esophageal reflux disease without esophagitis: Secondary | ICD-10-CM

## 2022-09-22 DIAGNOSIS — Z90710 Acquired absence of both cervix and uterus: Secondary | ICD-10-CM

## 2022-09-22 DIAGNOSIS — G8929 Other chronic pain: Secondary | ICD-10-CM

## 2022-09-22 MED ORDER — DERMAPHOR EX OINT: 1.0000 "application " | TOPICAL_OINTMENT | CUTANEOUS | 1 refills | Status: DC | PRN

## 2022-09-22 NOTE — Progress Notes (Signed)
New Patient Office Visit  Subjective    Patient ID: Victoria Moore, female    DOB: [birth year]  Age: 46 y.o. MRN: [id number]  CC:  Chief Complaint  Patient presents with   Establish Care   HPI Ralph Benavidez presents to establish care. She is a 46 year old woman with a past medical history significant for epilepsy, migraines, diabetes, GERD, sarcoidosis, asthma, anxiety, ADHD, HTN, and insomnia.  She was previously followed by Dr. Tressia Miners.  Today Ms. Fortune has several concerns she would like to discuss.  She has a documented history of shiftwork sleep disorder and states that she has chronic difficulty staying asleep.  She wakes up several times nightly.  She has previously been prescribed Seroquel and trazodone but but these medications have been ineffective.  She would also reports chronic low back pain that she states is getting worse.  She apparently has shooting pains into her right leg.  She denies red flag symptoms of fever/chills, unintentional weight loss, night sweats, saddle anesthesia, and bowel/bladder incontinence.  Ms. Sanz has a history of migraines.  She was previously followed by neurology.  She is currently prescribed Maxalt for migraine relief, but states the medicine does not work as well as it used to.  She is not currently on a prophylactic medication for migraines.  She would also like to get back on medication for seizures, not because she has recently had a seizure but because she sometimes feels an aura similar to when she has had seizures in the past.  Her last seizure was in October of last year.  She has a documented history of epilepsy and states that she was previously prescribed Dilantin.  Lastly, Ms. Colella states that she has been experiencing diffuse body aches for the last day.  She denies current fever/chills and upper respiratory symptoms.  Acute concerns, chronic medical conditions, and outstanding preventative healthcare maintenance items discussed today  are individually addressed in A/P below.  Outpatient Encounter Medications as of 09/22/2022  Medication Sig   albuterol (PROVENTIL) (2.5 MG/3ML) 0.083% nebulizer solution Take 2.5 mg by nebulization every 6 (six) hours as needed for wheezing or shortness of breath.   albuterol (VENTOLIN HFA) 108 (90 Base) MCG/ACT inhaler Inhale 1-2 puffs into the lungs every 4 (four) hours as needed for wheezing or shortness of breath.   ALPRAZolam (XANAX) 0.25 MG tablet Take 1 tablet (0.25 mg total) by mouth 2 (two) times daily as needed for anxiety.   amphetamine-dextroamphetamine (ADDERALL XR) 20 MG 24 hr capsule Take 1 capsule (20 mg total) by mouth as needed.   chlorthalidone (HYGROTON) 25 MG tablet Take 1 tablet (25 mg total) by mouth daily.   dextromethorphan-guaiFENesin (MUCINEX DM) 30-600 MG 12hr tablet Take 1 tablet by mouth 2 (two) times daily.   Emollient [facility]) OINT Apply 1 application  topically as needed.   esomeprazole (NEXIUM) 40 MG capsule Take 1 capsule (40 mg total) by mouth daily.   fluticasone (FLONASE) 50 MCG/ACT nasal spray Place 1 spray into both nostrils 2 (two) times daily.   potassium chloride SA (KLOR-CON) 20 MEQ tablet Take 1 tablet (20 mEq total) by mouth 2 (two) times daily.   QUEtiapine (SEROQUEL) 25 MG tablet Take 25 mg by mouth at bedtime.   rizatriptan (MAXALT) 10 MG tablet Take 10 mg by mouth as needed for migraine. May repeat in 2 hours if needed   [DISCONTINUED] SUMAtriptan (IMITREX) 100 MG tablet TAKE 1 TABLET BY MOUTH 2X DAILY AS NEEDED FOR MIGRAINE. MAY REPEAT  IN 2 HOURS IF HEADACHE PERSISTS OR RECURS.   [DISCONTINUED] acetaminophen (TYLENOL 8 HOUR) 650 MG CR tablet Take 1 tablet (650 mg total) by mouth every 8 (eight) hours as needed for pain.   [DISCONTINUED] ferrous sulfate (FER-IN-SOL) 75 (15 Fe) MG/ML SOLN Take 0.8 mLs (60 mg total) by mouth 2 (two) times daily. With food   [DISCONTINUED] ondansetron (ZOFRAN-ODT) 4 MG disintegrating tablet Take 1 tablet (4 mg  total) by mouth every 8 (eight) hours as needed for nausea or vomiting.   [DISCONTINUED] pantoprazole (PROTONIX) 40 MG tablet Take 1 tablet (40 mg total) by mouth 2 (two) times daily.   [DISCONTINUED] sucralfate (CARAFATE) 1 g tablet Take 1 tablet (1 g total) by mouth 4 (four) times daily -  with meals and at bedtime. (Patient not taking: Reported on 05/26/2022)   [DISCONTINUED] traMADol (ULTRAM) 50 MG tablet Take 50 mg by mouth every 6 (six) hours as needed for moderate pain.  (Patient not taking: Reported on 05/26/2022)   [DISCONTINUED] traZODone (DESYREL) 50 MG tablet Take 1 tablet (50 mg total) by mouth at bedtime as needed for sleep.   Facility-Administered Encounter Medications as of 09/22/2022  Medication   nitroGLYCERIN (NITROSTAT) SL tablet 0.4 mg    Past Medical History:  Diagnosis Date   Acute low back pain without sciatica 06/22/2018   ADHD    Allergy    Anemia    Anxiety    Arthritis    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/10/2016   Refer to Rheumatology see Nov 25 2016 [place] :  ? Fibromyalgia, doubt sarcoid    Asthma    Blood transfusion without reported diagnosis    Chest wall pain 05/26/2018   Chronic kidney disease    Chronic low back pain with sciatica 09/15/2018   Common migraine with intractable migraine 10/07/2016   Cough variant asthma vs UACS  10/23/2016   - Spirometry 04/15/2016  Very truncated exp loop effort dep portion only  - Allergy profile 10/22/2016 >  Eos 0.2/  IgE  52 RAST POS grass/trees/ragweed  10/22/2016  After extensive coaching HFA effectiveness =    75% try duelra 100 2bid > improved   Daytime sleepiness 05/26/2018   Decreased pedal pulses 10/04/2018   Depression    Depression, major, single episode, moderate (HCC) 05/26/2018   Diabetes (HCC)    Dyspnea 04/15/2016   04/15/2016  Walked RA x 3 laps @ 185 ft each stopped due to  End of study, nl pace, no sob or desat    - Spirometry 04/15/2016  Very truncated exp loop effort dep portion only  - 10/22/2016  Walked  RA x 3 laps @ 185 ft each stopped due to  End of study, slow pace, min sob/ no desat - full pfts rec 10/22/2016 >>>       Edema    Essential hypertension, benign 11/15/2016   Fibroids 03/11/2016   Frequency of urination 09/11/2018   GERD (gastroesophageal reflux disease)    Herniated lumbar intervertebral disc    Hiatal hernia    Hilar adenopathy    Hypertension    Hypokalemia 06/28/2018   Impaired fasting blood sugar 09/11/2018   Iron deficiency    Leg pain 12/07/2018   Low libido 11/27/2018   Migraine    Mild sleep apnea 08/03/2018   HST 06/20/18  AHI  8.1 / snoring with 02 nadir 80% >  08/03/2018 rec sleep medicine consultation    Morbid (severe) obesity due to excess calories (HCC) 10/23/2016  Personal history of sarcoidosis 05/26/2018   Prolonged capillary refill time    Right leg swelling 08/18/2018   Sarcoidosis    personal history of   Seizures ([place])    history of    Sleep apnea    Spondylosis    Vitamin D deficiency     Past Surgical History:  Procedure Laterality Date   ABDOMINAL HYSTERECTOMY     ESOPHAGEAL MANOMETRY N/A 09/05/2019   Procedure: ESOPHAGEAL MANOMETRY (EM);  Surgeon: Lavena Bullion, DO;  Location: [facility];  Service: Gastroenterology;  Laterality: N/A;   LAPAROSCOPIC OVARIAN CYSTECTOMY Left 03/11/2016   Procedure: LAPAROSCOPIC OVARIAN CYSTECTOMY;  Surgeon: Eldred Manges, MD;  Location: [facility];  Service: Gynecology;  Laterality: Left;   LAPAROSCOPIC VAGINAL HYSTERECTOMY WITH SALPINGECTOMY Bilateral 03/11/2016   Procedure: LAPAROSCOPIC ASSISTED VAGINAL HYSTERECTOMY WITH SALPINGECTOMY;  Surgeon: Eldred Manges, MD;  Location: [facility];  Service: Gynecology;  Laterality: Bilateral;   [place] IMPEDANCE STUDY  09/05/2019   Procedure: [place] IMPEDANCE STUDY;  Surgeon: Lavena Bullion, DO;  Location: [facility];  Service: Gastroenterology;;   TUBAL LIGATION     UPPER GASTROINTESTINAL ENDOSCOPY  10/11/2019   06/2019    Family History  Adopted: Yes  Problem  Relation Age of Onset   Other Son        Growing pains   Asthma Mother    Heart Problems Mother    Migraines Mother    COPD Mother    Heart attack Mother    Diabetes Father    Peptic Ulcer Father    Heart Problems Father    COPD Father    Heart attack Father    Colon cancer Neg Hx    Colon polyps Neg Hx    Esophageal cancer Neg Hx    Rectal cancer Neg Hx    Stomach cancer Neg Hx     Social History   Socioeconomic History   Marital status: Significant Other    Spouse name: Not on file   Number of children: 1   Years of education: 11   Highest education level: Not on file  Occupational History   Occupation: [employer]  Tobacco Use   Smoking status: Former    Packs/day: 0.25    Years: 17.00    Total pack years: 4.25    Types: Cigarettes    Quit date: 03/02/2013    Years since quitting: 9.5   Smokeless tobacco: Never  Vaping Use   Vaping Use: Never used  Substance and Sexual Activity   Alcohol use: Not Currently   Drug use: Not Currently    Types: Marijuana    Comment: former - 6 yrs ago   Sexual activity: Yes    Birth control/protection: Surgical  Other Topics Concern   Not on file  Social History Narrative   She lives w/ her fiance'. She has one son.    Highest level of education:  12th grade, did not graduate. Currently back in school.   Right-handed   Caffeine: 1 cup of coffee some mornings      Social History      Diet? No       Do you drink/eat things with caffeine? yes      Marital status?           Single                          What year were you married?  Do you live in a house, apartment, assisted living, condo, trailer, etc.? house      Is it one or more stories?     How many persons live in your home?  2      Do you have any pets in your home? (please list)   No       Highest level of education completed? Some college       Current or past profession:       Do you exercise?       Yes                                Type & how  often? Daily       Advanced Directives      Do you have a living will? No       Do you have a DNR form?                                  If not, do you want to discuss one? No       Do you have signed POA/HPOA for forms?  No       Functional Status      Do you have difficulty bathing or dressing yourself? No       Do you have difficulty preparing food or eating? No       Do you have difficulty managing your medications? No       Do you have difficulty managing your finances? No       Do you have difficulty affording your medications? No       Social Determinants of Radio broadcast assistant Strain: Not on file  Food Insecurity: Not on file  Transportation Needs: Not on file  Physical Activity: Not on file  Stress: Not on file  Social Connections: Not on file  Intimate Partner Violence: Not on file   Review of Systems  Constitutional:  Negative for chills, fever and weight loss.  Musculoskeletal:  Positive for back pain, joint pain and myalgias.  Neurological:  Positive for headaches.  Psychiatric/Behavioral:  The patient has insomnia.   All other systems reviewed and are negative.  Objective    BP 120/78   Pulse 81   Ht 6' (1.829 m)   Wt 235 lb (106.6 kg)   LMP 02/19/2016   SpO2 99%   BMI 31.87 kg/m   Physical Exam Constitutional:      General: She is not in acute distress.    Appearance: Normal appearance. She is not toxic-appearing.  HENT:     Head: Normocephalic and atraumatic.     Right Ear: External ear normal.     Left Ear: External ear normal.     Nose: Nose normal. No congestion or rhinorrhea.     Mouth/Throat:     Mouth: Mucous membranes are moist.     Pharynx: Oropharynx is clear. No oropharyngeal exudate or posterior oropharyngeal erythema.  Eyes:     General: No scleral icterus.    Extraocular Movements: Extraocular movements intact.     Conjunctiva/sclera: Conjunctivae normal.     Pupils: Pupils are equal, round, and reactive to light.   Cardiovascular:     Rate and Rhythm: Normal rate and regular rhythm.     Pulses: Normal pulses.     Heart sounds: Normal  heart sounds. No murmur heard.    No friction rub. No gallop.  Pulmonary:     Effort: Pulmonary effort is normal.     Breath sounds: Normal breath sounds. No wheezing, rhonchi or rales.  Abdominal:     General: Abdomen is flat. Bowel sounds are normal. There is no distension.     Palpations: Abdomen is soft.     Tenderness: There is no abdominal tenderness.  Musculoskeletal:        General: Normal range of motion.     Cervical back: Normal range of motion.     Right lower leg: No edema.     Left lower leg: No edema.  Lymphadenopathy:     Cervical: No cervical adenopathy.  Skin:    General: Skin is warm and dry.     Capillary Refill: Capillary refill takes less than 2 seconds.     Coloration: Skin is not jaundiced.  Neurological:     General: No focal deficit present.     Mental Status: She is alert and oriented to person, place, and time.  Psychiatric:        Mood and Affect: Mood normal.        Behavior: Behavior normal.    Last CBC Lab Results  Component Value Date   WBC 3.6 05/26/2022   HGB 12.7 05/26/2022   HCT 37.9 05/26/2022   MCV 88 05/26/2022   MCH 29.5 05/26/2022   RDW 12.5 05/26/2022   PLT 191 82/80/0349   Last metabolic panel Lab Results  Component Value Date   GLUCOSE 88 05/26/2022   NA 143 05/26/2022   K 4.0 05/26/2022   CL 105 05/26/2022   CO2 24 05/26/2022   BUN 14 05/26/2022   CREATININE 1.08 (H) 05/26/2022   EGFR 63 05/26/2022   CALCIUM 8.7 05/26/2022   PROT 6.6 05/26/2022   ALBUMIN 4.5 05/26/2022   LABGLOB 2.1 05/26/2022   AGRATIO 2.1 05/26/2022   BILITOT 0.2 05/26/2022   ALKPHOS 90 05/26/2022   AST 14 05/26/2022   ALT 9 05/26/2022   ANIONGAP 5 11/08/2021   Last lipids Lab Results  Component Value Date   CHOL 182 05/26/2022   HDL 62 05/26/2022   LDLCALC 104 (H) 05/26/2022   TRIG 87 05/26/2022   CHOLHDL 2.9  05/26/2022   Last hemoglobin A1c Lab Results  Component Value Date   HGBA1C 5.6 05/26/2022   Last thyroid functions Lab Results  Component Value Date   TSH 1.590 05/26/2022   Last vitamin D Lab Results  Component Value Date   VD25OH 20.07 (L) 08/18/2018   Last vitamin B12 and Folate Lab Results  Component Value Date   VITAMINB12 642 06/27/2018   FOLATE >24.1 06/27/2018   Assessment & Plan:   Problem List Items Addressed This Visit     Common migraine with intractable migraine    She is currently prescribed Maxalt but states it is not effective as previously.  Not currently prescribed a prophylactic medication.  She would like to reestablish care with neurology. -Neurology referral placed today      Essential hypertension, benign    BP 120/78.  Well-controlled on chlorthalidone 25 mg daily.  No changes today      Asthmatic bronchitis    Symptoms well controlled with as needed use of albuterol and fluticasone.  Albuterol refilled today.      GERD (gastroesophageal reflux disease)    Symptoms well controlled with Nexium.  No changes today.  Chronic low back pain with sciatica    Previously documented history of chronic low back pain.  She states that she has been referred to physical therapy previously but this did not help.  No red flag symptoms present today.  I reviewed treatment options with her.  She has been provided with home PT exercises.  We can further discuss her pain at follow-up and consider referral to pain management or orthopedic surgery.  She states that her previous PCP was planning to refer her to pain management. -Reassess at follow-up in 2 months.      Sarcoidosis    Previously evaluated by pulmonology in 2017.  She is not currently on any medication for treatment/management of symptoms related to sarcoidosis.      Shift work sleep disorder    Chronic issue.  She describes difficulty staying asleep today.  She has previously tried Seroquel  and trazodone with no improvement. -Reviewed appropriate sleep hygiene measures today -Plan to further address and follow-up.      Social anxiety disorder    Prescribed Xanax for as needed use.  She would like to reestablish care with psychiatry.  Referral placed today.      Attention deficit hyperactivity disorder (ADHD), predominantly inattentive type - Primary    Prescribed Adderall for as needed use.  She would like to reestablish care with psychiatry.  Referral placed today.      History of epilepsy    She reports a history of epilepsy today and states she was previously prescribed Dilantin.  She is not currently on any antiepileptic medication but would like to restart medication soon.  Last seizure occurred October 2022.  She recently states she has had auras similar to what she has previously experienced when she has had a seizure. -Referral placed to neurology      Preventative health care    Presenting today to establish care.  Recent labs reviewed. -Repeat BMP and vitamin D.  One-time HCV screening ordered today -Influenza vaccine administered today -Colonoscopy completed September 2020 with 4 hyperplastic polyps noted.  Repeat colonoscopy recommended for 10 years. -Last Pap smear completed November 2020 with no abnormal findings.  She previously underwent laparoscopic vaginal hysterectomy with bilateral salpingectomy in March 2017.  The operative note states that the cervix was removed.  She has requested a referral to OB/GYN today to reestablish care.      Return in about 2 months (around 11/22/2022).   Johnette Abraham, MD

## 2022-09-22 NOTE — Patient Instructions (Signed)
It was a pleasure to see you today.  Thank you for giving [place] the opportunity to be involved in your care.  Below is a brief recap of your visit and next steps.  We will plan to see you again in 2 months  Summary I have placed referrals to psychiatry, neurology, and OBGYN today You will receive your flu shot today and we will check labs  Next steps Follow up in 2 months I will notify you of lab results

## 2022-09-22 NOTE — Assessment & Plan Note (Signed)
She is currently prescribed Maxalt but states it is not effective as previously.  Not currently prescribed a prophylactic medication.  She would like to reestablish care with neurology. -Neurology referral placed today

## 2022-09-22 NOTE — Assessment & Plan Note (Signed)
Prescribe Xanax for as needed use.  She would like to reestablish care with psychiatry.  Referral placed today.

## 2022-09-22 NOTE — Assessment & Plan Note (Signed)
Symptoms well controlled with as needed use of albuterol and fluticasone.  Albuterol refilled today.

## 2022-09-22 NOTE — Assessment & Plan Note (Signed)
Prescribed Adderall for as needed use.  She would like to reestablish care with psychiatry.  Referral placed today.

## 2022-09-22 NOTE — Assessment & Plan Note (Signed)
Symptoms well-controlled with Nexium.  No changes today. 

## 2022-09-22 NOTE — Assessment & Plan Note (Signed)
Previously documented history of chronic low back pain.  She states that she has been referred to physical therapy previously but this did not help.  No red flag symptoms present today.  I reviewed treatment options with her.  She has been provided with home PT exercises.  We can further discuss her pain at follow-up and consider referral to pain management or orthopedic surgery.  She states that her previous PCP was planning to refer her to pain management. -Reassess at follow-up in 2 months.

## 2022-09-22 NOTE — Assessment & Plan Note (Signed)
Chronic issue.  She describes difficulty staying asleep today.  She has previously tried Seroquel and trazodone with no improvement. -Reviewed appropriate sleep hygiene measures today -Plan to further address and follow-up.

## 2022-09-22 NOTE — Assessment & Plan Note (Signed)
She reports a history of epilepsy today and states she was previously prescribed Dilantin.  She is not currently on any antiepileptic medication but would like to restart medication soon.  Last seizure occurred October 2022.  She recently states she has had auras similar to what she has previously experienced when she has had a seizure. -Referral placed to neurology

## 2022-09-22 NOTE — Assessment & Plan Note (Signed)
Presenting today to establish care.  Recent labs reviewed. -Repeat BMP and vitamin D.  One-time HCV screening ordered today -Influenza vaccine administered today -Colonoscopy completed September 2020 with 4 hyperplastic polyps noted.  Repeat colonoscopy recommended for 10 years. -Last Pap smear completed November 2020 with no abnormal findings.  She previously underwent laparoscopic vaginal hysterectomy with bilateral salpingectomy in March 2017.  The operative note states that the cervix was removed.  She has requested a referral to OB/GYN today to reestablish care.

## 2022-09-22 NOTE — Assessment & Plan Note (Addendum)
Previously evaluated by pulmonology in 2017.  She is not currently on any medication for treatment/management of symptoms related to sarcoidosis.

## 2022-09-22 NOTE — Assessment & Plan Note (Signed)
BP 120/78.  Well-controlled on chlorthalidone 25 mg daily.  No changes today

## 2022-09-23 ENCOUNTER — Other Ambulatory Visit: Payer: Self-pay | Admitting: Internal Medicine

## 2022-09-23 ENCOUNTER — Encounter: Payer: Self-pay | Admitting: Internal Medicine

## 2022-09-23 ENCOUNTER — Inpatient Hospital Stay: Admission: RE | Admit: 2022-09-23 | Payer: Self-pay | Source: Ambulatory Visit

## 2022-09-23 DIAGNOSIS — E559 Vitamin D deficiency, unspecified: Secondary | ICD-10-CM

## 2022-09-23 LAB — BASIC METABOLIC PANEL
BUN/Creatinine Ratio: 11 (ref 9–23)
BUN: 13 mg/dL (ref 6–24)
CO2: 22 mmol/L (ref 20–29)
Calcium: 8.9 mg/dL (ref 8.7–10.2)
Chloride: 105 mmol/L (ref 96–106)
Creatinine, Ser: 1.14 mg/dL — ABNORMAL HIGH (ref 0.57–1.00)
Glucose: 55 mg/dL — ABNORMAL LOW (ref 70–99)
Potassium: 4.1 mmol/L (ref 3.5–5.2)
Sodium: 143 mmol/L (ref 134–144)
eGFR: 59 mL/min/{1.73_m2} — ABNORMAL LOW (ref >59)

## 2022-09-23 LAB — VITAMIN D 25 HYDROXY (VIT D DEFICIENCY, FRACTURES): Vit D, 25-Hydroxy: 17.2 ng/mL — ABNORMAL LOW (ref 30.0–100.0)

## 2022-09-23 LAB — HCV AB W REFLEX TO QUANT PCR: HCV Ab: NONREACTIVE

## 2022-09-23 LAB — HCV INTERPRETATION

## 2022-09-23 MED ORDER — VITAMIN D (ERGOCALCIFEROL) 1.25 MG (50000 UNIT) PO CAPS: 50000.0000 [IU] | ORAL_CAPSULE | ORAL | 0 refills | Status: DC

## 2022-09-30 ENCOUNTER — Encounter: Payer: Self-pay | Admitting: Internal Medicine

## 2022-09-30 DIAGNOSIS — F900 Attention-deficit hyperactivity disorder, predominantly inattentive type: Secondary | ICD-10-CM

## 2022-09-30 DIAGNOSIS — F4010 Social phobia, unspecified: Secondary | ICD-10-CM

## 2022-09-30 MED ORDER — ALPRAZOLAM 0.25 MG PO TABS: 0.2500 mg | ORAL_TABLET | Freq: Two times a day (BID) | ORAL | 0 refills | Status: DC | PRN

## 2022-09-30 MED ORDER — AMPHETAMINE-DEXTROAMPHET ER 20 MG PO CP24: 20.0000 mg | ORAL_CAPSULE | ORAL | 0 refills | Status: DC | PRN

## 2022-10-01 ENCOUNTER — Other Ambulatory Visit: Payer: Self-pay | Admitting: Internal Medicine

## 2022-10-01 DIAGNOSIS — F4010 Social phobia, unspecified: Secondary | ICD-10-CM

## 2022-10-01 DIAGNOSIS — F900 Attention-deficit hyperactivity disorder, predominantly inattentive type: Secondary | ICD-10-CM

## 2022-10-01 MED ORDER — AMPHETAMINE-DEXTROAMPHET ER 20 MG PO CP24: 20.0000 mg | ORAL_CAPSULE | ORAL | 0 refills | Status: DC | PRN

## 2022-10-01 MED ORDER — ALPRAZOLAM 0.25 MG PO TABS: 0.2500 mg | ORAL_TABLET | Freq: Two times a day (BID) | ORAL | 0 refills | Status: DC | PRN

## 2022-10-01 NOTE — Progress Notes (Signed)
Rx for Adderall and Xanax previously sent to [facility] in [place], [place].  Per patient request due to insurance purposes, new Rx sent to [facility] in [place], [place].  We have contacted [facility] to cancel previous prescriptions.

## 2022-10-04 ENCOUNTER — Encounter: Payer: Self-pay | Admitting: Internal Medicine

## 2022-10-06 ENCOUNTER — Encounter: Payer: Self-pay | Admitting: Internal Medicine

## 2022-10-07 ENCOUNTER — Other Ambulatory Visit: Payer: Self-pay

## 2022-10-07 DIAGNOSIS — E559 Vitamin D deficiency, unspecified: Secondary | ICD-10-CM

## 2022-10-07 MED ORDER — VITAMIN D (ERGOCALCIFEROL) 1.25 MG (50000 UNIT) PO CAPS: 50000.0000 [IU] | ORAL_CAPSULE | ORAL | 0 refills | Status: DC

## 2022-10-12 ENCOUNTER — Ambulatory Visit (INDEPENDENT_AMBULATORY_CARE_PROVIDER_SITE_OTHER): Payer: 59 | Admitting: Internal Medicine

## 2022-10-12 ENCOUNTER — Encounter: Payer: Self-pay | Admitting: Internal Medicine

## 2022-10-12 VITALS — BP 130/84 | HR 69 | Resp 16 | Ht 70.0 in | Wt 237.0 lb

## 2022-10-12 DIAGNOSIS — F322 Major depressive disorder, single episode, severe without psychotic features: Secondary | ICD-10-CM | POA: Diagnosis not present

## 2022-10-12 DIAGNOSIS — G6289 Other specified polyneuropathies: Secondary | ICD-10-CM

## 2022-10-12 DIAGNOSIS — G629 Polyneuropathy, unspecified: Secondary | ICD-10-CM

## 2022-10-12 DIAGNOSIS — F329 Major depressive disorder, single episode, unspecified: Secondary | ICD-10-CM | POA: Insufficient documentation

## 2022-10-12 DIAGNOSIS — F331 Major depressive disorder, recurrent, moderate: Secondary | ICD-10-CM

## 2022-10-12 MED ORDER — DULOXETINE HCL 20 MG PO CPEP: 20.0000 mg | ORAL_CAPSULE | Freq: Every day | ORAL | 1 refills | Status: DC

## 2022-10-12 NOTE — Assessment & Plan Note (Signed)
Patient describes pain in feet and hands. Pain started in feet. Described as constant burning pain.  She has been on Gabapentin for neuropathy, but it does not help pain. This pain is more intense and started 2 weeks ago. It is rated out 8/10.   Assessment/Plan: Peripheral neuropathy. Unknown etiology, patient has had similar pain on chart review seen in February. Patient is not a diabetic. Plan to rule out common causes of neuropathy. Physical exam does not show glove and stocking distrubution. Her foot exam did show decreased sensation in her plantar surface of feet. She is depressed and plan to start duloxetine today which should help with nerve pain. She is on Gabapentin 300 mg at night for this as well. This was prescribed at another office and could be stopped if duloxetine is helping. Patient is on seroquel for insomnia. Seroquel and duloxetine  - X78 - Basic Metabolic Panel (BMET) - CBC With Differential - DULoxetine (CYMBALTA) 20 MG capsule; Take 1 capsule (20 mg total) by mouth daily.  Dispense: 30 capsule; Refill: 1 - TSH - HIV Ag/Ab Combo with Reflex - Hepatitis B surface antigen - Hepatitis B surface antibody,qualitative - Hepatitis B core antibody, total - HIV antibody (with reflex)

## 2022-10-12 NOTE — Patient Instructions (Signed)
Thank you for trusting me with your care. To recap, today we discussed the following:   1. Neuropathy: We will check labs and follow up with results. Start Cymbalta and follow up in 4 weeks. If you have any side effects after starting this medication then let me know and stop medication. I do not expect at this lower doses.  - B12 - Hepatitis B core antibody, total - Hepatitis B surface antibody,qualitative - Hepatitis B surface antigen - HIV Ag/Ab Combo with Reflex - TSH - Basic Metabolic Panel (BMET) - CBC With Differential - DULoxetine (CYMBALTA) 20 MG capsule; Take 1 capsule (20 mg total) by mouth daily.  Dispense: 30 capsule; Refill: 1  2. Moderate episode of recurrent major depressive disorder (HCC) - DULoxetine (CYMBALTA) 20 MG capsule; Take 1 capsule (20 mg total) by mouth daily.  Dispense: 30 capsule; Refill: 1 - Ambulatory referral to [facility]

## 2022-10-12 NOTE — Progress Notes (Unsigned)
CC: nerve pain and depression  HPI:[name] who presents for evaluation of nerve pain and depression. For the details of today's visit, please refer to the assessment and plan.   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 Fibromyalgia, doubt sarcoid    Asthma    Blood transfusion without reported diagnosis    Chest wall pain 05/26/2018   Chronic kidney disease    Chronic low back pain with sciatica 09/15/2018   Common migraine with intractable migraine 10/07/2016   Cough variant asthma vs UACS  10/23/2016   - Spirometry 04/15/2016  Very truncated exp loop effort dep portion only  - Allergy profile 10/22/2016 >  Eos 0.2/  IgE  52 RAST POS grass/trees/ragweed  10/22/2016  After extensive coaching HFA effectiveness =    75% try duelra 100 2bid > improved   Daytime sleepiness 05/26/2018   Decreased pedal pulses 10/04/2018   Depression    Depression, major, single episode, moderate ([employer]) 05/26/2018   Diabetes ([place])    Dyspnea 04/15/2016   04/15/2016  Walked RA x 3 laps @ 185 ft each stopped due to  End of study, nl pace, no sob or desat    - Spirometry 04/15/2016  Very truncated exp loop effort dep portion only  - 10/22/2016  Walked RA x 3 laps @ 185 ft each stopped due to  End of study, slow pace, min sob/ no desat - full pfts rec 10/22/2016 >>>       Edema    Essential hypertension, benign 11/15/2016   Fibroids 03/11/2016   Frequency of urination 09/11/2018   GERD (gastroesophageal reflux disease)    Herniated lumbar intervertebral disc    Hiatal hernia    Hilar adenopathy    Hypertension    Hypokalemia 06/28/2018   Impaired fasting blood sugar 09/11/2018   Iron deficiency    Leg pain 12/07/2018   Low libido 11/27/2018   Migraine    Mild sleep apnea 08/03/2018   HST  06/20/18  AHI  8.1 / snoring with 02 nadir 80% >  08/03/2018 rec sleep medicine consultation    Morbid (severe) obesity due to excess calories ([place]) 10/23/2016   Personal history of sarcoidosis 05/26/2018   Prolonged capillary refill time    Right leg swelling 08/18/2018   Sarcoidosis    personal history of   Seizures ([place])    history of    Sleep apnea    Spondylosis    Vitamin D deficiency     Review of Systems:    Review of Systems  Constitutional:  Negative for chills and fever.  Cardiovascular:  Negative for chest pain and palpitations.  Psychiatric/Behavioral:  Positive for depression. The patient is not nervous/anxious.      Physical Exam: Vitals:   10/12/22 1001  BP: 130/84  Pulse: 69  Resp: 16  SpO2: 98%  Weight: 237 lb (107.5 kg)  Height: '5\' 10"'$  (1.778 m)     Physical Exam   Assessment & Plan:   Peripheral neuropathy Patient describes pain in feet and hands. Pain started in feet. Described as constant burning pain.  She has been on Gabapentin for neuropathy, but it does not help  pain. This pain is more intense and started 2 weeks ago. It is rated out 8/10.   Assessment/Plan: Peripheral neuropathy. Unknown etiology, patient has had similar pain on chart review seen in February. Patient is not a diabetic. Plan to rule out common causes of neuropathy. Physical exam does not show glove and stocking distrubution. Her foot exam did show decreased sensation in her plantar surface of feet. She is depressed and plan to start duloxetine today which should help with nerve pain. She is on Gabapentin 300 mg at night for this as well. This was prescribed at another office and could be stopped if duloxetine is helping. Patient is on seroquel for insomnia. Seroquel and duloxetine  - C38 - Basic Metabolic Panel (BMET) - CBC With Differential - DULoxetine (CYMBALTA) 20 MG capsule; Take 1 capsule (20 mg total) by mouth daily.  Dispense: 30 capsule; Refill: 1 - TSH - HIV Ag/Ab Combo  with Reflex - Hepatitis B surface antigen - Hepatitis B surface antibody,qualitative - Hepatitis B core antibody, total - HIV antibody (with reflex)    MDD (major depressive disorder)  Depression, PHQ-9: Based on the patients  [facility] Visit from 10/12/2022 in [facility]  PHQ-9 Total Score 23      score we have suggest severe depression.  Patient says she has been depressed for over a year since losing her mother. She is not currently treated for depression and also has peripheral neuropathy pain.   Assessment/Plan: Major Depressive Disorder, severe , not in remission. Will start duloxetine and have referred to integrated behavioral health for counseling. Follow up in 2 weeks.      Lorene Dy, MD

## 2022-10-13 ENCOUNTER — Ambulatory Visit: Payer: 59 | Admitting: Internal Medicine

## 2022-10-13 ENCOUNTER — Other Ambulatory Visit: Payer: Self-pay

## 2022-10-13 DIAGNOSIS — G629 Polyneuropathy, unspecified: Secondary | ICD-10-CM

## 2022-10-13 DIAGNOSIS — F331 Major depressive disorder, recurrent, moderate: Secondary | ICD-10-CM

## 2022-10-13 LAB — CBC WITH DIFFERENTIAL
Basophils Absolute: 0 10*3/uL (ref 0.0–0.2)
Basos: 1 %
EOS (ABSOLUTE): 0.3 10*3/uL (ref 0.0–0.4)
Eos: 6 %
Hematocrit: 40.5 % (ref 34.0–46.6)
Hemoglobin: 13.6 g/dL (ref 11.1–15.9)
Immature Grans (Abs): 0 10*3/uL (ref 0.0–0.1)
Immature Granulocytes: 0 %
Lymphocytes Absolute: 1.3 10*3/uL (ref 0.7–3.1)
Lymphs: 31 %
MCH: 29.1 pg (ref 26.6–33.0)
MCHC: 33.6 g/dL (ref 31.5–35.7)
MCV: 87 fL (ref 79–97)
Monocytes Absolute: 0.1 10*3/uL (ref 0.1–0.9)
Monocytes: 3 %
Neutrophils Absolute: 2.5 10*3/uL (ref 1.4–7.0)
Neutrophils: 59 %
RBC: 4.68 x10E6/uL (ref 3.77–5.28)
RDW: 12.2 % (ref 11.7–15.4)
WBC: 4.2 10*3/uL (ref 3.4–10.8)

## 2022-10-13 LAB — BASIC METABOLIC PANEL
BUN/Creatinine Ratio: 11 (ref 9–23)
BUN: 12 mg/dL (ref 6–24)
CO2: 22 mmol/L (ref 20–29)
Calcium: 8.8 mg/dL (ref 8.7–10.2)
Chloride: 103 mmol/L (ref 96–106)
Creatinine, Ser: 1.1 mg/dL — ABNORMAL HIGH (ref 0.57–1.00)
Glucose: 103 mg/dL — ABNORMAL HIGH (ref 70–99)
Potassium: 4.3 mmol/L (ref 3.5–5.2)
Sodium: 141 mmol/L (ref 134–144)
eGFR: 62 mL/min/{1.73_m2} (ref >59)

## 2022-10-13 LAB — VITAMIN B12: Vitamin B-12: 353 pg/mL (ref 232–1245)

## 2022-10-13 LAB — HIV ANTIBODY (ROUTINE TESTING W REFLEX): HIV Screen 4th Generation wRfx: NONREACTIVE

## 2022-10-13 MED ORDER — DULOXETINE HCL 20 MG PO CPEP: 20.0000 mg | ORAL_CAPSULE | Freq: Every day | ORAL | 1 refills | Status: DC

## 2022-10-13 NOTE — Assessment & Plan Note (Addendum)
  Depression, PHQ-9: Based on the patients  [facility] Visit from 10/12/2022 in [facility]  PHQ-9 Total Score 23      score we have suggest severe depression.  Patient says she has been depressed for over a year since losing her mother. She is not currently treated for depression and also has peripheral neuropathy pain.   Assessment/Plan: Major Depressive Disorder, severe , not in remission. Will start duloxetine and have referred to integrated behavioral health for counseling. Follow up in 2 weeks.

## 2022-10-14 LAB — HEPATITIS B CORE ANTIBODY, TOTAL: Hep B Core Total Ab: NEGATIVE

## 2022-10-14 LAB — HEPATITIS B SURFACE ANTIBODY,QUALITATIVE: Hep B Surface Ab, Qual: NONREACTIVE

## 2022-10-14 LAB — HEPATITIS B SURFACE ANTIGEN: Hepatitis B Surface Ag: NEGATIVE

## 2022-10-14 LAB — TSH: TSH: 2.1 u[IU]/mL (ref 0.450–4.500)

## 2022-10-18 ENCOUNTER — Encounter: Payer: Self-pay | Admitting: Internal Medicine

## 2022-10-18 ENCOUNTER — Ambulatory Visit: Payer: 59 | Admitting: Internal Medicine

## 2022-10-18 ENCOUNTER — Other Ambulatory Visit: Payer: Self-pay

## 2022-10-18 MED ORDER — TIZANIDINE HCL 2 MG PO TABS: 2.0000 mg | ORAL_TABLET | Freq: Two times a day (BID) | ORAL | 0 refills | Status: DC | PRN

## 2022-10-18 NOTE — Progress Notes (Deleted)
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 Sarcoid by CT Chest in 2015 with new cough/sob plus arthritis mostly in ankles  and sensation of sensitive skin treated with cycles of prednisone referred to pulmonary clinic 04/15/2016 by Dr Charlotte Crumb     History of Present Illness  04/15/2016 1st [facility] office visit/ Victoria Moore   Chief Complaint  Patient presents with   Pulmonary Consult    Referred by Dr. Lujean Amel. 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 She c/o SOB since then, esp worse in the past 2 months. SOB comes and goes, and is worse with exertion. She sometimes wakes up in the night with SOB. She has occ cough- sometimes prod with min yellow sputum.    1 y h/o persistent daily doe x every time across the room - occ happens at rest   Cough brought on by smells/ assoc with mild dysphagia on nexium 40 mg daily / sometimes coughs so hard hurts in center of chest transient only  Can't lie flat x one year , ok propped up  No better on dulera 100 though inhaler she brought was on 0 and using just samples so far on 2 bid basis with very poor hfa - see a/p Breathing improves p mom's neb saba  rec Continue dulera 100 Take 2 puffs first thing in am and then another 2 puffs about 12 hours later.  Work on inhaler technique:  relax and gently blow all the way out then take a nice smooth deep breath back in, triggering the inhaler at same time you start breathing in.  Hold for up to 5 seconds if you can. Blow out thru nose. Rinse and gargle with water when done Continue nexium 40 mg   Take  30-60 min before first meal of the day and Pepcid (famotidine)  20 mg one in evening or  toward bedtime until return to office - this is the best way to tell whether stomach acid is contributing to your problem.   GERD Please remember to go to the  lab  downstairs for your tests - we will call you with the results when they are available. Please schedule a follow up office visit in 4 weeks, sooner if needed with all active  medications in hand including over the counters > did not go to lab as rec Add pfts on return at [facility] > did not return as rec      10/22/2016  Extended f/u ov/Victoria Moore to re-establish re: : ? Sarcoidosis / new back pain p fell 09/29/16  Chief Complaint  Patient presents with   Acute Visit    Pt c/o joint pain "all over"- esp in her back and started approx 1 month ago. Pt also having non prod cough. She has noticed some blurred vision. She states she gets SOB just walking up any sort of incline.   onset of arthritis esp feet and ankles summer 2017 rash   Doe = MMRC1 = can walk nl pace, flat grade, can't hurry or go uphills or steps s sob   Cough is day > noct/ cough and sob better on dulera 100 despite poor hfa and only 2 at hs rec Increase the dulera 100 to Take 2 puffs first thing in am and  then another 2 puffs about 12 hours later.  Work on inhaler technique:  Please remember to go to the lab and x-ray department downstairs for your tests - we will call you with the results when they are available. Please schedule a follow up office visit in 2 weeks, sooner if needed with pfts at [facility] first - bring all active medications/ inhalers > did not do    11/10/2016  f/u ov/Victoria Moore re: ? Sarcoid related joint complaints  Chief Complaint  Patient presents with   Follow-up    Still having joint back and back pain. Her cough and SOB have improved. No new co's today.    completely eliminated all symptoms on prednisone, joint pain  came back 3-4 days later but cough/ sob did not  Rec Rheum f/u Ov q 3 m> did not return  01/23/21 NP recx Finish prednisone and Zpack  as directed.  Mucinex DM Twice daily  As needed  Cough .  Albuterol Inhaler 1-2 puffs every 4hrs as needed for wheezing  Begin QVAR 2 puffs Twice daily   , brush/rinse/gargle after use.  Follow up with Dr. Melvyn Novas  In 3 months and As needed  > did not return as rec  10/18/2022  Re-establish ov/Victoria Moore office/Victoria Moore re: *** maint on ***  No chief complaint on file.   Dyspnea:  *** Cough: *** Sleeping: *** SABA use: *** 02: *** Covid status: *** Lung cancer screening: ***   No obvious day to day or daytime variability or assoc excess/ purulent sputum or mucus plugs or hemoptysis or cp or chest tightness, subjective wheeze or overt sinus or hb symptoms.   *** without nocturnal  or early am exacerbation  of respiratory  c/o's or need for noct saba. Also denies any obvious fluctuation of symptoms with weather or environmental changes or other aggravating or alleviating factors except as outlined above   No unusual exposure hx or h/o childhood pna/ asthma or knowledge of premature birth.  Current Allergies, Complete Past Medical History, Past Surgical History, Family History, and Social History were reviewed in [employer] record.  ROS  The following are not active complaints unless bolded Hoarseness, sore throat, dysphagia, dental problems, itching, sneezing,  nasal congestion or discharge of excess mucus or purulent secretions, ear ache,   fever, chills, sweats, unintended wt loss or wt gain, classically pleuritic or exertional cp,  orthopnea pnd or arm/hand swelling  or leg swelling, presyncope, palpitations, abdominal pain, anorexia, nausea, vomiting, diarrhea  or change in bowel habits or change in bladder habits, change in stools or change in urine, dysuria, hematuria,  rash, arthralgias, visual complaints, headache, numbness, weakness or ataxia or problems with walking or coordination,  change in mood or  memory.        No outpatient medications have been marked as taking for the 10/18/22 encounter (Appointment) with Tanda Rockers, MD.   Current Facility-Administered Medications for the 10/18/22 encounter  (Appointment) with Tanda Rockers, MD  Medication   nitroGLYCERIN (NITROSTAT) SL tablet 0.4 mg                        Objective:   Physical Exam   wts  10/18/2022       ***  11/10/2016       285  10/22/2016      287   04/15/16 276 lb (125.193 kg)  03/11/16 270 lb (122.471 kg)  03/02/16 270 lb (122.471 kg)  Vital signs reviewed  10/18/2022  - Note at rest 02 sats  ***% on ***   General appearance:    ***                  Assessment & Plan:

## 2022-10-20 ENCOUNTER — Encounter: Payer: Self-pay | Admitting: Internal Medicine

## 2022-10-20 ENCOUNTER — Telehealth: Payer: Self-pay | Admitting: Licensed Clinical Social Worker

## 2022-10-20 ENCOUNTER — Encounter: Payer: 59 | Admitting: Licensed Clinical Social Worker

## 2022-10-20 NOTE — Telephone Encounter (Signed)
Call pt regarding scheduled mychart visit. Left message requesting callback

## 2022-10-21 ENCOUNTER — Encounter: Payer: Self-pay | Admitting: Family Medicine

## 2022-10-21 ENCOUNTER — Ambulatory Visit (INDEPENDENT_AMBULATORY_CARE_PROVIDER_SITE_OTHER): Payer: 59 | Admitting: Family Medicine

## 2022-10-21 DIAGNOSIS — J208 Acute bronchitis due to other specified organisms: Secondary | ICD-10-CM | POA: Diagnosis not present

## 2022-10-21 MED ORDER — METHYLPREDNISOLONE 4 MG PO TBPK: ORAL_TABLET | ORAL | 0 refills | Status: DC

## 2022-10-21 NOTE — Progress Notes (Deleted)
   Acute Office Visit  Subjective:     Patient ID: Victoria Moore, female    DOB: July 07, 1976, 46 y.o.   MRN: [id number]  Chief Complaint  Patient presents with   Cough    Pt reports coughing, sob of breathe and coughing up yellow phlegm, onset of sx began 10/19/22.    HPI Patient is in today with complaints of upper respiratory infection in the last 3 days preceding her onset of increase lower airway congestion, cough was with purulent sputum, last 1  When 3 days ago.  A litle wheezing with cough  Worsen at night time, muscinxe PM  no  Little nasal conegets, chest conecnets  , no sore throat.   Sob with coughijng  Freqwnt use oof inhalfer 4-5 time daily    ROS      Objective:    LMP 02/19/2016  {Vitals History (Optional):23777}  Physical Exam  No results found for any visits on 10/21/22.      Assessment & Plan:   Problem List Items Addressed This Visit   None   No orders of the defined types were placed in this encounter.   No follow-ups on file.  Alvira Monday, FNP

## 2022-10-21 NOTE — Progress Notes (Signed)
Virtual Visit via Telephone Note   This visit type was conducted via telephone. This format is felt to be most appropriate for this patient at this time.  The patient did not have access to video technology/had technical difficulties with video requiring transitioning to audio format only (telephone).  All issues noted in this document were discussed and addressed.  No physical exam could be performed with this format.  Evaluation Performed:  Follow-up visit  Date:  10/21/2022   ID:  Victoria Moore, DOB [birth year], MRN [id number]  Patient Location: Home Provider Location: Clinic  Participants: Patient Location of Patient: Home Location of Provider: Clinic Consent was obtain for visit to be over via telehealth. I verified that I am speaking with the correct person using two identifiers.  PCP:  Johnette Abraham, MD   Chief Complaint:  cough  History of Present Illness:    Victoria Moore is a 46 y.o. female with Patient with complaints of cough with purulent sputum, shortness of breath when coughing, and wheezing since 10/19/2022.  She has a history of asthma and reports the onset of symptoms after URI 3 days ago.  She reports frequent use of her albuterol inhaler, noting to use her inhaler 4-5 times daily.  She reports congestion in her lower airway and denies sore throat, fever, and chills.  She reports that her cough worsened at nighttime,  and she has tried over-the-counter Mucinex PM with minimal relief of her symptom.  The patient does have symptoms concerning for COVID-19 infection (fever, chills, cough, or new shortness of breath).   Past Medical, Surgical, Social History, Allergies, and Medications have been Reviewed.  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 Fibromyalgia, doubt  sarcoid    Asthma    Blood transfusion without reported diagnosis    Chest wall pain 05/26/2018   Chronic kidney disease    Chronic low back pain with sciatica 09/15/2018   Common migraine with intractable migraine 10/07/2016   Cough variant asthma vs UACS  10/23/2016   - Spirometry 04/15/2016  Very truncated exp loop effort dep portion only  - Allergy profile 10/22/2016 >  Eos 0.2/  IgE  52 RAST POS grass/trees/ragweed  10/22/2016  After extensive coaching HFA effectiveness =    75% try duelra 100 2bid > improved   Daytime sleepiness 05/26/2018   Decreased pedal pulses 10/04/2018   Depression    Depression, major, single episode, moderate ([place]) 05/26/2018   Diabetes ([place])    Dyspnea 04/15/2016   04/15/2016  Walked RA x 3 laps @ 185 ft each stopped due to  End of study, nl pace, no sob or desat    - Spirometry 04/15/2016  Very truncated exp loop effort dep portion only  - 10/22/2016  Walked RA x 3 laps @ 185 ft each stopped due to  End of study, slow pace, min sob/ no desat - full pfts rec 10/22/2016 >>>       Edema    Essential hypertension, benign 11/15/2016   Fibroids 03/11/2016   Frequency of urination 09/11/2018   GERD (gastroesophageal reflux disease)    Herniated lumbar intervertebral disc    Hiatal hernia    Hilar adenopathy  Hypertension    Hypokalemia 06/28/2018   Impaired fasting blood sugar 09/11/2018   Iron deficiency    Leg pain 12/07/2018   Low libido 11/27/2018   Migraine    Mild sleep apnea 08/03/2018   HST 06/20/18  AHI  8.1 / snoring with 02 nadir 80% >  08/03/2018 rec sleep medicine consultation    Morbid (severe) obesity due to excess calories ([place]) 10/23/2016   Personal history of sarcoidosis 05/26/2018   Prolonged capillary refill time    Right leg swelling 08/18/2018   Sarcoidosis    personal history of   Seizures ([place])    history of    Sleep apnea    Spondylosis    Vitamin D deficiency    Past Surgical History:  Procedure Laterality Date   ABDOMINAL HYSTERECTOMY      ESOPHAGEAL MANOMETRY N/A 09/05/2019   Procedure: ESOPHAGEAL MANOMETRY (EM);  Surgeon: Lavena Bullion, DO;  Location: [facility];  Service: Gastroenterology;  Laterality: N/A;   LAPAROSCOPIC OVARIAN CYSTECTOMY Left 03/11/2016   Procedure: LAPAROSCOPIC OVARIAN CYSTECTOMY;  Surgeon: Eldred Manges, MD;  Location: [facility];  Service: Gynecology;  Laterality: Left;   LAPAROSCOPIC VAGINAL HYSTERECTOMY WITH SALPINGECTOMY Bilateral 03/11/2016   Procedure: LAPAROSCOPIC ASSISTED VAGINAL HYSTERECTOMY WITH SALPINGECTOMY;  Surgeon: Eldred Manges, MD;  Location: [facility];  Service: Gynecology;  Laterality: Bilateral;   [place] IMPEDANCE STUDY  09/05/2019   Procedure: [place] IMPEDANCE STUDY;  Surgeon: Lavena Bullion, DO;  Location: [facility];  Service: Gastroenterology;;   TUBAL LIGATION     UPPER GASTROINTESTINAL ENDOSCOPY  10/11/2019   06/2019     Current Meds  Medication Sig   albuterol (PROVENTIL) (2.5 MG/3ML) 0.083% nebulizer solution Take 2.5 mg by nebulization every 6 (six) hours as needed for wheezing or shortness of breath.   albuterol (VENTOLIN HFA) 108 (90 Base) MCG/ACT inhaler Inhale 1-2 puffs into the lungs every 4 (four) hours as needed for wheezing or shortness of breath.   ALPRAZolam (XANAX) 0.25 MG tablet Take 1 tablet (0.25 mg total) by mouth 2 (two) times daily as needed for up to 30 doses for anxiety.   amphetamine-dextroamphetamine (ADDERALL XR) 20 MG 24 hr capsule Take 1 capsule (20 mg total) by mouth as needed.   chlorthalidone (HYGROTON) 25 MG tablet Take 1 tablet (25 mg total) by mouth daily.   DULoxetine (CYMBALTA) 20 MG capsule Take 1 capsule (20 mg total) by mouth daily.   Emollient [facility]) OINT Apply 1 application  topically as needed.   fluticasone (FLONASE) 50 MCG/ACT nasal spray Place 1 spray into both nostrils 2 (two) times daily.   methylPREDNISolone (MEDROL DOSEPAK) 4 MG TBPK tablet Take as the package instructed   QUEtiapine (SEROQUEL) 25 MG tablet Take 25 mg by  mouth at bedtime.   rizatriptan (MAXALT) 10 MG tablet Take 10 mg by mouth as needed for migraine. May repeat in 2 hours if needed   tiZANidine (ZANAFLEX) 2 MG tablet Take 1 tablet (2 mg total) by mouth every 12 (twelve) hours as needed for up to 30 doses for muscle spasms.   Vitamin D, Ergocalciferol, (DRISDOL) 1.25 MG (50000 UNIT) CAPS capsule Take 1 capsule (50,000 Units total) by mouth every 7 (seven) days for 12 doses.   Current Facility-Administered Medications for the 10/21/22 encounter (Office Visit) with Alvira Monday, FNP  Medication   nitroGLYCERIN (NITROSTAT) SL tablet 0.4 mg     Allergies:   Aspirin   ROS:   Please see the history of present illness.  All other systems reviewed and are negative.   Labs/Other Tests and Data Reviewed:    Recent Labs: 05/26/2022: ALT 9; Platelets 191 10/12/2022: BUN 12; Creatinine, Ser 1.10; Hemoglobin 13.6; Potassium 4.3; Sodium 141; TSH 2.100   Recent Lipid Panel Lab Results  Component Value Date/Time   CHOL 182 05/26/2022 10:00 AM   TRIG 87 05/26/2022 10:00 AM   HDL 62 05/26/2022 10:00 AM   CHOLHDL 2.9 05/26/2022 10:00 AM   CHOLHDL 3.6 08/22/2019 03:40 PM   LDLCALC 104 (H) 05/26/2022 10:00 AM   LDLCALC 118 (H) 08/22/2019 03:40 PM    Wt Readings from Last 3 Encounters:  10/12/22 237 lb (107.5 kg)  09/22/22 235 lb (106.6 kg)  05/26/22 216 lb 3.2 oz (98.1 kg)     Objective:    Vital Signs:  LMP 02/19/2016      ASSESSMENT & PLAN:   Acute bronchitis Upper respiratory infection preceded symptom onset Will treat with steroid taper to decrease lower airway inflammation Encouraged to take Tylenol as needed for body aches and fever Encouraged to take over-the-counter Robitussin syrup for cough Encouraged to call if her symptoms worsen after steroid treatments  Time:   Today, I have spent 15 minutes reviewing the chart, including problem list, medications, and with the patient with telehealth technology discussing the  above problems.   Medication Adjustments/Labs and Tests Ordered: Current medicines are reviewed at length with the patient today.  Concerns regarding medicines are outlined above.   Tests Ordered: No orders of the defined types were placed in this encounter.   Medication Changes: Meds ordered this encounter  Medications   methylPREDNISolone (MEDROL DOSEPAK) 4 MG TBPK tablet    Sig: Take as the package instructed    Dispense:  1 each    Refill:  0     Note: This dictation was prepared with Dragon dictation along with smaller phrase technology. Similar sounding words can be transcribed inadequately or may not be corrected upon review. Any transcriptional errors that result from this process are unintentional.      Disposition:  Follow up  Signed, Alvira Monday, FNP  10/21/2022 2:45 PM     [facility]

## 2022-10-22 ENCOUNTER — Ambulatory Visit: Payer: 59 | Admitting: Adult Health

## 2022-10-27 ENCOUNTER — Other Ambulatory Visit: Payer: Self-pay | Admitting: Internal Medicine

## 2022-10-31 ENCOUNTER — Other Ambulatory Visit: Payer: Self-pay | Admitting: Internal Medicine

## 2022-11-03 ENCOUNTER — Encounter: Payer: Self-pay | Admitting: Neurology

## 2022-11-03 ENCOUNTER — Institutional Professional Consult (permissible substitution): Payer: 59 | Admitting: Neurology

## 2022-11-08 ENCOUNTER — Encounter: Payer: Self-pay | Admitting: Emergency Medicine

## 2022-11-08 ENCOUNTER — Emergency Department
Admission: EM | Admit: 2022-11-08 | Discharge: 2022-11-08 | Disposition: A | Discharge status: Home / Self Care | Payer: 59 | Attending: Emergency Medicine | Admitting: Emergency Medicine

## 2022-11-08 ENCOUNTER — Emergency Department: Payer: 59

## 2022-11-08 ENCOUNTER — Other Ambulatory Visit: Payer: Self-pay

## 2022-11-08 DIAGNOSIS — R519 Headache, unspecified: Secondary | ICD-10-CM | POA: Diagnosis not present

## 2022-11-08 DIAGNOSIS — R001 Bradycardia, unspecified: Secondary | ICD-10-CM | POA: Diagnosis not present

## 2022-11-08 DIAGNOSIS — I129 Hypertensive chronic kidney disease with stage 1 through stage 4 chronic kidney disease, or unspecified chronic kidney disease: Secondary | ICD-10-CM | POA: Diagnosis not present

## 2022-11-08 DIAGNOSIS — G43901 Migraine, unspecified, not intractable, with status migrainosus: Secondary | ICD-10-CM

## 2022-11-08 DIAGNOSIS — G43821 Menstrual migraine, not intractable, with status migrainosus: Secondary | ICD-10-CM | POA: Insufficient documentation

## 2022-11-08 DIAGNOSIS — G43909 Migraine, unspecified, not intractable, without status migrainosus: Secondary | ICD-10-CM | POA: Diagnosis not present

## 2022-11-08 DIAGNOSIS — N189 Chronic kidney disease, unspecified: Secondary | ICD-10-CM | POA: Diagnosis not present

## 2022-11-08 LAB — CBC WITH DIFFERENTIAL/PLATELET
Abs Immature Granulocytes: 0.01 10*3/uL (ref 0.00–0.07)
Basophils Absolute: 0 10*3/uL (ref 0.0–0.1)
Basophils Relative: 1 %
Eosinophils Absolute: 0.2 10*3/uL (ref 0.0–0.5)
Eosinophils Relative: 6 %
HCT: 34.4 % — ABNORMAL LOW (ref 36.0–46.0)
Hemoglobin: 11.3 g/dL — ABNORMAL LOW (ref 12.0–15.0)
Immature Granulocytes: 0 %
Lymphocytes Relative: 29 %
Lymphs Abs: 1.2 10*3/uL (ref 0.7–4.0)
MCH: 28.6 pg (ref 26.0–34.0)
MCHC: 32.8 g/dL (ref 30.0–36.0)
MCV: 87.1 fL (ref 80.0–100.0)
Monocytes Absolute: 0.3 10*3/uL (ref 0.1–1.0)
Monocytes Relative: 7 %
Neutro Abs: 2.3 10*3/uL (ref 1.7–7.7)
Neutrophils Relative %: 57 %
Platelets: 198 10*3/uL (ref 150–400)
RBC: 3.95 MIL/uL (ref 3.87–5.11)
RDW: 12 % (ref 11.5–15.5)
WBC: 4 10*3/uL (ref 4.0–10.5)
nRBC: 0 % (ref 0.0–0.2)

## 2022-11-08 LAB — BASIC METABOLIC PANEL
Anion gap: 6 (ref 5–15)
BUN: 12 mg/dL (ref 6–20)
CO2: 26 mmol/L (ref 22–32)
Calcium: 8.4 mg/dL — ABNORMAL LOW (ref 8.9–10.3)
Chloride: 107 mmol/L (ref 98–111)
Creatinine, Ser: 1 mg/dL (ref 0.44–1.00)
GFR, Estimated: >60 mL/min (ref >60)
Glucose, Bld: 85 mg/dL (ref 70–99)
Potassium: 3.9 mmol/L (ref 3.5–5.1)
Sodium: 139 mmol/L (ref 135–145)

## 2022-11-08 MED ORDER — LACTATED RINGERS IV BOLUS
1000.0000 mL | Freq: Once | INTRAVENOUS | Status: AC
  Administered 2022-11-08: 1000 mL via INTRAVENOUS

## 2022-11-08 MED ORDER — PROCHLORPERAZINE MALEATE 10 MG PO TABS: 10.0000 mg | ORAL_TABLET | Freq: Four times a day (QID) | ORAL | 0 refills | Status: DC | PRN

## 2022-11-08 MED ORDER — PROCHLORPERAZINE EDISYLATE 10 MG/2ML IJ SOLN
10.0000 mg | Freq: Once | INTRAMUSCULAR | Status: AC
  Administered 2022-11-08: 10 mg via INTRAVENOUS
  Filled 2022-11-08: qty 2

## 2022-11-08 MED ORDER — DIPHENHYDRAMINE HCL 50 MG/ML IJ SOLN
25.0000 mg | Freq: Once | INTRAMUSCULAR | Status: AC
  Administered 2022-11-08: 25 mg via INTRAVENOUS
  Filled 2022-11-08: qty 1

## 2022-11-08 MED ORDER — KETOROLAC TROMETHAMINE 30 MG/ML IJ SOLN
15.0000 mg | Freq: Once | INTRAMUSCULAR | Status: AC
  Administered 2022-11-08: 15 mg via INTRAVENOUS
  Filled 2022-11-08: qty 1

## 2022-11-08 NOTE — ED Provider Notes (Signed)
[facility] Provider Note    Event Date/Time   First MD Initiated Contact with Patient 11/08/22 1017     (approximate)   History   Chief Complaint Migraine   HPI  Victoria Moore is a 46 y.o. female with past medical history of hypertension, migraines, seizures, anemia, CKD, and sarcoidosis who presents to the ED complaining of headache.  Patient reports that she has had constant throbbing pain across the front and both sides of her head for the past 8 days.  Pain is exacerbated by bright lights and loud noises, has been associated with nausea but she has not had any vomiting or diarrhea.  She describes symptoms as similar to prior migraines but more severe, additionally complains of tingling in all 4 extremities that is not typical for her migraines.  She denies any focal weakness and has not had any vision or speech changes.  She has not had any fevers or neck stiffness.  She has taken her rizatriptan at home without significant relief.     Physical Exam   Triage Vital Signs: ED Triage Vitals [11/08/22 0933]  Enc Vitals Group     BP 112/77     Pulse Rate 64     Resp 16     Temp 98.6 F (37 C)     Temp Source Oral     SpO2 100 %     Weight 237 lb (107.5 kg)     Height '5\' 11"'$  (1.803 m)     Head Circumference      Peak Flow      Pain Score 10     Pain Loc      Pain Edu?      Excl. in [place]?     Most recent vital signs: Vitals:   11/08/22 0933  BP: 112/77  Pulse: 64  Resp: 16  Temp: 98.6 F (37 C)  SpO2: 100%    Constitutional: Alert and oriented. Eyes: Conjunctivae are normal.  Pupils equal, round, and reactive to light bilaterally. Head: Atraumatic. Nose: No congestion/rhinnorhea. Mouth/Throat: Mucous membranes are moist.  Neck: Supple with no meningismus. Cardiovascular: Normal rate, regular rhythm. Grossly normal heart sounds.  2+ radial pulses bilaterally. Respiratory: Normal respiratory effort.  No retractions. Lungs  CTAB. Gastrointestinal: Soft and nontender. No distention. Musculoskeletal: No lower extremity tenderness nor edema.  Neurologic:  Normal speech and language. No gross focal neurologic deficits are appreciated.    ED Results / Procedures / Treatments   Labs (all labs ordered are listed, but only abnormal results are displayed) Labs Reviewed  CBC WITH DIFFERENTIAL/PLATELET - Abnormal; Notable for the following components:      Result Value   Hemoglobin 11.3 (*)    HCT 34.4 (*)    All other components within normal limits  BASIC METABOLIC PANEL - Abnormal; Notable for the following components:   Calcium 8.4 (*)    All other components within normal limits     EKG  ED ECG REPORT I, Blake Divine, the attending physician, personally viewed and interpreted this ECG.   Date: 11/08/2022  EKG Time: 12:26  Rate: 54  Rhythm: sinus bradycardia  Axis: Normal  Intervals:none  ST&T Change: None  RADIOLOGY CT head reviewed and interpreted by me with no hemorrhage or midline shift.  PROCEDURES:  Critical Care performed: No  Procedures   MEDICATIONS ORDERED IN ED: Medications  prochlorperazine (COMPAZINE) injection 10 mg (10 mg Intravenous Given 11/08/22 1143)  diphenhydrAMINE (BENADRYL) injection 25 mg (25  mg Intravenous Given 11/08/22 1143)  ketorolac (TORADOL) 30 MG/ML injection 15 mg (15 mg Intravenous Given 11/08/22 1143)  lactated ringers bolus 1,000 mL (1,000 mLs Intravenous New Bag/Given 11/08/22 1144)     IMPRESSION / MDM / ASSESSMENT AND PLAN / ED COURSE  I reviewed the triage vital signs and the nursing notes.                              46 y.o. female with past medical history of hypertension, migraines, seizures, anemia, CKD, and sarcoidosis who presents to the ED complaining of severe bilateral throbbing headache for the past 8 days associated with tingling in all 4 of her extremities.  Patient's presentation is most consistent with acute presentation with  potential threat to life or bodily function.  Differential diagnosis includes, but is not limited to, migraine headache, tension headache, meningitis, SAH, electrolyte abnormality, anemia.  Patient nontoxic-appearing and in no acute distress, vital signs are unremarkable.  She has a nonfocal neurologic exam and no findings concerning for meningismus on exam.  Symptoms seem consistent with migraine headache, however are more more severe than her typical with tingling in her extremities, we will check CT head.  Labs are reassuring with no significant anemia, leukocytosis, electrolyte abnormality, or AKI.  Patient is status post hysterectomy, we will treat with migraine cocktail including IV Compazine, Benadryl, and Toradol.  CT head is negative for acute process, EKG shows no tensive arrhythmia or ischemia.  On reassessment, patient reports that headache is improving and she is appropriate for discharge home with neurology follow-up.  She states that she is in the process of establishing with a new local neurologist, has an appointment upcoming next month.  She will be prescribed Compazine for use as needed, was counseled to return to the ED for new or worsening symptoms.  Patient agrees with plan.      FINAL CLINICAL IMPRESSION(S) / ED DIAGNOSES   Final diagnoses:  Migraine with status migrainosus, not intractable, unspecified migraine type     Rx / DC Orders   ED Discharge Orders          Ordered    prochlorperazine (COMPAZINE) 10 MG tablet  Every 6 hours PRN        11/08/22 1303             Note:  This document was prepared using Dragon voice recognition software and may include unintentional dictation errors.   Blake Divine, MD 11/08/22 1304

## 2022-11-08 NOTE — ED Notes (Signed)
See triage note  Presents with a migraine for the past 8 days  States she has a hx of same  no relief with medication at home  Pos nausea  Also having some body aches

## 2022-11-08 NOTE — ED Triage Notes (Signed)
Pt to ED via POV stating that she was had a migraine for over a week. Pt has hx/o same. Pt states that she has tried her prescription medication as well as over the counter medications but they are not helping. Pt states that she has had some nausea and dizziness, as well as cramping all over her body. Pt is currently in NAD.

## 2022-11-09 ENCOUNTER — Encounter: Payer: Self-pay | Admitting: Internal Medicine

## 2022-11-09 ENCOUNTER — Other Ambulatory Visit (HOSPITAL_COMMUNITY): Payer: Self-pay

## 2022-11-09 ENCOUNTER — Other Ambulatory Visit: Payer: Self-pay | Admitting: Internal Medicine

## 2022-11-09 ENCOUNTER — Other Ambulatory Visit: Payer: Self-pay

## 2022-11-09 ENCOUNTER — Ambulatory Visit: Payer: 59 | Admitting: Internal Medicine

## 2022-11-09 DIAGNOSIS — G43019 Migraine without aura, intractable, without status migrainosus: Secondary | ICD-10-CM

## 2022-11-09 DIAGNOSIS — F900 Attention-deficit hyperactivity disorder, predominantly inattentive type: Secondary | ICD-10-CM

## 2022-11-09 DIAGNOSIS — S39012D Strain of muscle, fascia and tendon of lower back, subsequent encounter: Secondary | ICD-10-CM

## 2022-11-09 MED ORDER — FLUTICASONE PROPIONATE 50 MCG/ACT NA SUSP
1.0000 | Freq: Two times a day (BID) | NASAL | 0 refills | Status: DC
  Filled 2022-11-09 – 2022-12-23 (×2): qty 16, 30d supply, fill #0

## 2022-11-09 MED ORDER — ALBUTEROL SULFATE (2.5 MG/3ML) 0.083% IN NEBU
2.5000 mg | INHALATION_SOLUTION | Freq: Four times a day (QID) | RESPIRATORY_TRACT | 0 refills | Status: DC | PRN
  Filled 2022-11-09: qty 90, 8d supply, fill #0

## 2022-11-09 MED ORDER — SUMATRIPTAN 20 MG/ACT NA SOLN: 20.0000 mg | NASAL | 0 refills | Status: DC | PRN

## 2022-11-09 MED ORDER — AMPHETAMINE-DEXTROAMPHET ER 20 MG PO CP24: 20.0000 mg | ORAL_CAPSULE | ORAL | 0 refills | Status: DC | PRN

## 2022-11-13 ENCOUNTER — Encounter: Payer: Self-pay | Admitting: Internal Medicine

## 2022-11-13 DIAGNOSIS — G43019 Migraine without aura, intractable, without status migrainosus: Secondary | ICD-10-CM

## 2022-11-15 MED ORDER — SUMATRIPTAN 20 MG/ACT NA SOLN: 20.0000 mg | NASAL | 0 refills | Status: DC | PRN

## 2022-11-16 ENCOUNTER — Other Ambulatory Visit: Payer: Self-pay | Admitting: Internal Medicine

## 2022-11-16 DIAGNOSIS — G43019 Migraine without aura, intractable, without status migrainosus: Secondary | ICD-10-CM

## 2022-11-16 MED ORDER — EMGALITY 120 MG/ML ~~LOC~~ SOAJ: 120.0000 mg | SUBCUTANEOUS | 2 refills | Status: DC

## 2022-11-17 ENCOUNTER — Ambulatory Visit (INDEPENDENT_AMBULATORY_CARE_PROVIDER_SITE_OTHER): Payer: 59 | Admitting: Internal Medicine

## 2022-11-17 ENCOUNTER — Encounter: Payer: Self-pay | Admitting: Internal Medicine

## 2022-11-17 VITALS — BP 137/85 | HR 57 | Resp 16 | Ht 71.0 in | Wt 245.0 lb

## 2022-11-17 DIAGNOSIS — G43019 Migraine without aura, intractable, without status migrainosus: Secondary | ICD-10-CM

## 2022-11-17 DIAGNOSIS — M5442 Lumbago with sciatica, left side: Secondary | ICD-10-CM | POA: Diagnosis not present

## 2022-11-17 DIAGNOSIS — M5441 Lumbago with sciatica, right side: Secondary | ICD-10-CM

## 2022-11-17 DIAGNOSIS — G8929 Other chronic pain: Secondary | ICD-10-CM | POA: Diagnosis not present

## 2022-11-17 MED ORDER — NAPROXEN 500 MG PO TABS: 500.0000 mg | ORAL_TABLET | Freq: Two times a day (BID) | ORAL | 0 refills | Status: DC

## 2022-11-17 MED ORDER — TIZANIDINE HCL 2 MG PO TABS: ORAL_TABLET | ORAL | 0 refills | Status: DC

## 2022-11-17 NOTE — Assessment & Plan Note (Signed)
Patient is using Maxalt everyday. Medication helps but she can not have refilled. Discussed prophylaxis , but patient has history of seizures. Valproate did not help when taking this mediation. Topiramate could worsen depression. Avoiding amitriptyline and venlafaxine because history of seizure disorder. Heart rate is 57, so need to avoid beta blockers. This leaves CGRP class. Patient has appointment scheduled with neurology and I recommend following up with this referral.

## 2022-11-17 NOTE — Assessment & Plan Note (Addendum)
Patients is having acute on chronic back pain. This is the same pain patient was having 2 years ago when MRI was completed of lumbar spine. Reviewed MRI and it showed minimal changes. Expect patients depression may be worsening symptoms. Will treat acute flair with Naproxen .Allergy with anaphalaxis reported to Asprin. Has taken aleve without allergic reaction. Will take Zanaflex already prescribed.

## 2022-11-17 NOTE — Patient Instructions (Signed)
Thank you for trusting me with your care. To recap, today we discussed the following:  Common migraine with intractable migraine - Follow up with neurology referral for recommendation on medication for prophylaxis  Chronic midline low back pain with bilateral sciatica - naproxen (NAPROSYN) 500 MG tablet; Take 1 tablet (500 mg total) by mouth 2 (two) times daily with a meal.  Dispense: 20 tablet; Refill: 0 - Take Zanflex as prescribed for acute pain

## 2022-11-17 NOTE — Progress Notes (Signed)
CC: Back Pain (Low back pain- chronic but has gotten worse recently. Rates pain 10/10. States it radiates sometimes down her legs) and Migraine (States she keeps a headache everyday but the nasal spray she had helped but she is out and insurance wouldn't pay for it again so soon )    HPI:[name] who presents for evaluation of low back pain and migraine headaches. For the details of today's visit, please refer to the assessment and plan.    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 Fibromyalgia, doubt sarcoid    Asthma    Blood transfusion without reported diagnosis    Chest wall pain 05/26/2018   Chronic kidney disease    Chronic low back pain with sciatica 09/15/2018   Common migraine with intractable migraine 10/07/2016   Cough variant asthma vs UACS  10/23/2016   - Spirometry 04/15/2016  Very truncated exp loop effort dep portion only  - Allergy profile 10/22/2016 >  Eos 0.2/  IgE  52 RAST POS grass/trees/ragweed  10/22/2016  After extensive coaching HFA effectiveness =    75% try duelra 100 2bid > improved   Daytime sleepiness 05/26/2018   Decreased pedal pulses 10/04/2018   Depression    Depression, major, single episode, moderate ([place]) 05/26/2018   Diabetes ([place])    Dyspnea 04/15/2016   04/15/2016  Walked RA x 3 laps @ 185 ft each stopped due to  End of study, nl pace, no sob or desat    - Spirometry 04/15/2016  Very truncated exp loop effort dep portion only  - 10/22/2016  Walked RA x 3 laps @ 185 ft each stopped due to  End of study, slow pace, min sob/ no desat - full pfts rec 10/22/2016 >>>       Edema    Essential hypertension, benign 11/15/2016   Fibroids 03/11/2016   Frequency of urination 09/11/2018   GERD (gastroesophageal reflux disease)    Herniated  lumbar intervertebral disc    Hiatal hernia    Hilar adenopathy    Hypertension    Hypokalemia 06/28/2018   Impaired fasting blood sugar 09/11/2018   Iron deficiency    Leg pain 12/07/2018   Low libido 11/27/2018   Migraine    Mild sleep apnea 08/03/2018   HST 06/20/18  AHI  8.1 / snoring with 02 nadir 80% >  08/03/2018 rec sleep medicine consultation    Morbid (severe) obesity due to excess calories ([place]) 10/23/2016   Personal history of sarcoidosis 05/26/2018   Prolonged capillary refill time    Right leg swelling 08/18/2018   Sarcoidosis    personal history of   Seizures ([place])    history of    Sleep apnea    Spondylosis    Vitamin D deficiency      Physical Exam: Vitals:   11/17/22 1028  BP: 137/85  Pulse: (!) 57  Resp: 16  SpO2: 97%  Weight: 245 lb (111.1 kg)  Height: '5\' 11"'$  (1.803 m)     Physical Exam Constitutional:      General: She is not in acute distress.    Appearance: She is normal weight. She is not ill-appearing.  Eyes:  General: No scleral icterus.    Conjunctiva/sclera: Conjunctivae normal.  Cardiovascular:     Rate and Rhythm: Normal rate and regular rhythm.     Heart sounds: No murmur heard.    No friction rub.  Pulmonary:     Effort: Pulmonary effort is normal.     Breath sounds: No wheezing, rhonchi or rales.  Musculoskeletal:     Lumbar back: Tenderness present. No edema, signs of trauma or bony tenderness. Positive right straight leg raise test and positive left straight leg raise test.     Comments: Equal patellar and achilles reflexes  Psychiatric:        Mood and Affect: Mood normal.        Behavior: Behavior normal.     Comments: Depressed mood, flat affect      Assessment & Plan:   Chronic low back pain with sciatica Patients is having acute on chronic back pain. This is the same pain patient was having 2 years ago when MRI was completed of lumbar spine. Reviewed MRI and it showed minimal changes. Expect patients depression may be  worsening symptoms. Will treat acute flair with Naproxen .Allergy with anaphalaxis reported to Asprin. Has taken aleve without allergic reaction. Will take Zanaflex already prescribed.   Common migraine with intractable migraine Patient is using Maxalt everyday. Medication helps but she can not have refilled. Discussed prophylaxis , but patient has history of seizures. Valproate did not help when taking this mediation. Topiramate could worsen depression. Avoiding amitriptyline and venlafaxine because history of seizure disorder. Heart rate is 57, so need to avoid beta blockers. This leaves CGRP class. Patient has appointment scheduled with neurology and I recommend following up with this referral.    Lorene Dy, MD

## 2022-11-19 ENCOUNTER — Other Ambulatory Visit (HOSPITAL_COMMUNITY): Payer: Self-pay

## 2022-11-22 ENCOUNTER — Other Ambulatory Visit: Payer: Self-pay | Admitting: Internal Medicine

## 2022-11-22 ENCOUNTER — Ambulatory Visit: Payer: 59 | Admitting: Internal Medicine

## 2022-11-22 DIAGNOSIS — E559 Vitamin D deficiency, unspecified: Secondary | ICD-10-CM

## 2022-11-24 ENCOUNTER — Encounter: Payer: Self-pay | Admitting: Internal Medicine

## 2022-11-24 ENCOUNTER — Telehealth: Payer: Self-pay | Admitting: Internal Medicine

## 2022-11-24 MED ORDER — HYDROCODONE-ACETAMINOPHEN 5-325 MG PO TABS: 1.0000 | ORAL_TABLET | Freq: Four times a day (QID) | ORAL | 0 refills | Status: DC | PRN

## 2022-11-24 NOTE — Telephone Encounter (Signed)
Call was forwarded.

## 2022-11-24 NOTE — Addendum Note (Signed)
Addended by: Marland Kitchen E on: 11/24/2022 12:05 PM   Modules accepted: Orders

## 2022-11-24 NOTE — Telephone Encounter (Signed)
Pt returning call

## 2022-11-26 ENCOUNTER — Ambulatory Visit: Payer: 59 | Admitting: Internal Medicine

## 2022-12-08 ENCOUNTER — Ambulatory Visit: Payer: 59 | Admitting: Adult Health

## 2022-12-08 ENCOUNTER — Other Ambulatory Visit: Payer: Self-pay | Admitting: Internal Medicine

## 2022-12-08 ENCOUNTER — Other Ambulatory Visit: Payer: Self-pay

## 2022-12-08 DIAGNOSIS — F331 Major depressive disorder, recurrent, moderate: Secondary | ICD-10-CM

## 2022-12-08 DIAGNOSIS — G629 Polyneuropathy, unspecified: Secondary | ICD-10-CM

## 2022-12-08 MED ORDER — DULOXETINE HCL 20 MG PO CPEP: 20.0000 mg | ORAL_CAPSULE | Freq: Every day | ORAL | 1 refills | Status: DC

## 2022-12-13 ENCOUNTER — Encounter: Payer: Self-pay | Admitting: Internal Medicine

## 2022-12-23 ENCOUNTER — Other Ambulatory Visit: Payer: Self-pay | Admitting: Internal Medicine

## 2022-12-23 DIAGNOSIS — F4010 Social phobia, unspecified: Secondary | ICD-10-CM

## 2022-12-23 DIAGNOSIS — G8929 Other chronic pain: Secondary | ICD-10-CM

## 2022-12-23 DIAGNOSIS — S39012D Strain of muscle, fascia and tendon of lower back, subsequent encounter: Secondary | ICD-10-CM

## 2022-12-24 ENCOUNTER — Other Ambulatory Visit (HOSPITAL_COMMUNITY): Payer: Self-pay

## 2022-12-24 ENCOUNTER — Other Ambulatory Visit: Payer: Self-pay

## 2022-12-24 MED ORDER — ALPRAZOLAM 0.25 MG PO TABS: 0.2500 mg | ORAL_TABLET | Freq: Two times a day (BID) | ORAL | 0 refills | Status: DC | PRN

## 2022-12-24 MED ORDER — NAPROXEN 500 MG PO TABS
500.0000 mg | ORAL_TABLET | Freq: Two times a day (BID) | ORAL | 0 refills | Status: DC
  Filled 2022-12-24 – 2023-01-12 (×2): qty 20, 10d supply, fill #0

## 2023-01-05 ENCOUNTER — Other Ambulatory Visit: Payer: Self-pay | Admitting: Internal Medicine

## 2023-01-05 DIAGNOSIS — G629 Polyneuropathy, unspecified: Secondary | ICD-10-CM

## 2023-01-05 DIAGNOSIS — F331 Major depressive disorder, recurrent, moderate: Secondary | ICD-10-CM

## 2023-01-09 ENCOUNTER — Other Ambulatory Visit: Payer: Self-pay | Admitting: Internal Medicine

## 2023-01-09 DIAGNOSIS — G43019 Migraine without aura, intractable, without status migrainosus: Secondary | ICD-10-CM

## 2023-01-12 ENCOUNTER — Other Ambulatory Visit: Payer: Self-pay

## 2023-01-12 ENCOUNTER — Encounter: Payer: Self-pay | Admitting: Internal Medicine

## 2023-01-12 ENCOUNTER — Ambulatory Visit
Admission: RE | Admit: 2023-01-12 | Discharge: 2023-01-12 | Disposition: A | Discharge status: Home / Self Care | Payer: 59 | Source: Ambulatory Visit | Attending: Nurse Practitioner | Admitting: Nurse Practitioner

## 2023-01-12 VITALS — BP 130/98 | HR 82 | Temp 98.6°F | Resp 20

## 2023-01-12 DIAGNOSIS — J029 Acute pharyngitis, unspecified: Secondary | ICD-10-CM

## 2023-01-12 DIAGNOSIS — J069 Acute upper respiratory infection, unspecified: Secondary | ICD-10-CM | POA: Diagnosis not present

## 2023-01-12 DIAGNOSIS — Z87891 Personal history of nicotine dependence: Secondary | ICD-10-CM | POA: Diagnosis not present

## 2023-01-12 DIAGNOSIS — U071 COVID-19: Secondary | ICD-10-CM | POA: Insufficient documentation

## 2023-01-12 DIAGNOSIS — Z8616 Personal history of COVID-19: Secondary | ICD-10-CM | POA: Diagnosis not present

## 2023-01-12 DIAGNOSIS — R059 Cough, unspecified: Secondary | ICD-10-CM | POA: Diagnosis present

## 2023-01-12 DIAGNOSIS — R051 Acute cough: Secondary | ICD-10-CM

## 2023-01-12 LAB — POCT RAPID STREP A (OFFICE): Rapid Strep A Screen: NEGATIVE

## 2023-01-12 MED ORDER — OSELTAMIVIR PHOSPHATE 75 MG PO CAPS: 75.0000 mg | ORAL_CAPSULE | Freq: Two times a day (BID) | ORAL | 0 refills | Status: DC

## 2023-01-12 NOTE — ED Provider Notes (Signed)
[facility]    CSN: [id number] Arrival date & time: 01/12/23  0932      History   Chief Complaint Chief Complaint  Patient presents with   Cough    Body achesCoughing Sneezing Headache Chest pain with coughEtc - Entered by patient    HPI Victoria Moore is a 47 y.o. female.   HPI  She is complaining of fever, headache, dizziness, sore throat, cough with chest pain for cough and body aches with one episode of vomiting. She reports exposure positive/ COVID/Influenza form a group home resident.  Denies chills, nasal congestion, sneezing, runny nose, cough, new new loss of smell or taste, shortness of breath, or diarrhea. This has been going on for 1 day.  The current treatment has been OTC Alka-seltzer   She denies a history of HTN. She is no current treatment. She just got of work.  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 Fibromyalgia, doubt sarcoid    Asthma    Blood transfusion without reported diagnosis    Chest wall pain 05/26/2018   Chronic kidney disease    Chronic low back pain with sciatica 09/15/2018   Common migraine with intractable migraine 10/07/2016   Cough variant asthma vs UACS  10/23/2016   - Spirometry 04/15/2016  Very truncated exp loop effort dep portion only  - Allergy profile 10/22/2016 >  Eos 0.2/  IgE  52 RAST POS grass/trees/ragweed  10/22/2016  After extensive coaching HFA effectiveness =    75% try duelra 100 2bid > improved   Daytime sleepiness 05/26/2018   Decreased pedal pulses 10/04/2018   Depression    Depression, major, single episode, moderate ([place]) 05/26/2018   Diabetes ([place])    Dyspnea 04/15/2016   04/15/2016  Walked RA x 3 laps @ 185 ft each stopped due to  End of study, nl pace, no sob or desat    - Spirometry 04/15/2016  Very truncated exp  loop effort dep portion only  - 10/22/2016  Walked RA x 3 laps @ 185 ft each stopped due to  End of study, slow pace, min sob/ no desat - full pfts rec 10/22/2016 >>>       Edema    Essential hypertension, benign 11/15/2016   Fibroids 03/11/2016   Frequency of urination 09/11/2018   GERD (gastroesophageal reflux disease)    Herniated lumbar intervertebral disc    Hiatal hernia    Hilar adenopathy    Hypertension    Hypokalemia 06/28/2018   Impaired fasting blood sugar 09/11/2018   Iron deficiency    Leg pain 12/07/2018   Low libido 11/27/2018   Migraine    Mild sleep apnea 08/03/2018   HST 06/20/18  AHI  8.1 / snoring with 02 nadir 80% >  08/03/2018 rec sleep medicine consultation    Morbid (severe) obesity due to excess calories ([place]) 10/23/2016   Personal history of sarcoidosis 05/26/2018   Prolonged capillary refill time    Right leg swelling 08/18/2018   Sarcoidosis    personal history of   Seizures ([place])    history of    Sleep apnea    Spondylosis    Vitamin D deficiency     Patient Active Problem List  Diagnosis Date Noted   Peripheral neuropathy 10/12/2022   MDD (major depressive disorder) 10/12/2022   History of epilepsy 09/22/2022   Preventative health care 09/22/2022   Asthmatic bronchitis 01/23/2021   COVID-19 virus infection 01/23/2021   Social anxiety disorder 10/03/2020   Attention deficit hyperactivity disorder (ADHD), 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   Mild episode of recurrent major depressive disorder (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   BMI 45.0-49.9, adult ([place]) 11/29/2019   Other intervertebral disc degeneration, lumbar region 09/19/2019   Complex cyst of left ovary 02/01/2019   Sarcoidosis 02/01/2019   Leg pain 12/07/2018   Low libido 11/27/2018    Prolonged capillary refill time 10/04/2018   Decreased pedal pulses 10/04/2018   Spondylosis 09/15/2018   Chronic low back pain with sciatica 09/15/2018   Impaired fasting blood sugar 09/11/2018   Weight gain 09/11/2018   Mild sleep apnea 08/03/2018   Hypokalemia 06/28/2018   Medication side effect 06/22/2018   Acute low back pain without sciatica 06/22/2018   Personal history of sarcoidosis 05/26/2018   Vitamin D deficiency 05/26/2018   Encounter for health maintenance examination with abnormal findings 05/26/2018   Depression, major, single episode, moderate ([place]) 05/26/2018   Daytime sleepiness 05/26/2018   Chest pain of uncertain etiology 08/67/6195   GERD (gastroesophageal reflux disease) 05/26/2018   Essential hypertension, benign 11/15/2016   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/10/2016   Morbid (severe) obesity due to excess calories ([place]) 10/23/2016   Common migraine with intractable migraine 10/07/2016   DOE (dyspnea on exertion) 04/15/2016   Hilar adenopathy 04/15/2016   Seizures ([place]) 03/11/2016   Anemia 11/12/2014   Iron deficiency 11/12/2014    Past Surgical History:  Procedure Laterality Date   ABDOMINAL HYSTERECTOMY     ESOPHAGEAL MANOMETRY N/A 09/05/2019   Procedure: ESOPHAGEAL MANOMETRY (EM);  Surgeon: Lavena Bullion, DO;  Location: [facility];  Service: Gastroenterology;  Laterality: N/A;   LAPAROSCOPIC OVARIAN CYSTECTOMY Left 03/11/2016   Procedure: LAPAROSCOPIC OVARIAN CYSTECTOMY;  Surgeon: Eldred Manges, MD;  Location: [facility];  Service: Gynecology;  Laterality: Left;   LAPAROSCOPIC VAGINAL HYSTERECTOMY WITH SALPINGECTOMY Bilateral 03/11/2016   Procedure: LAPAROSCOPIC ASSISTED VAGINAL HYSTERECTOMY WITH SALPINGECTOMY;  Surgeon: Eldred Manges, MD;  Location: [facility];  Service: Gynecology;  Laterality: Bilateral;   [place] IMPEDANCE STUDY  09/05/2019   Procedure: [place] IMPEDANCE STUDY;  Surgeon: Lavena Bullion, DO;  Location: [facility];  Service:  Gastroenterology;;   TUBAL LIGATION     UPPER GASTROINTESTINAL ENDOSCOPY  10/11/2019   06/2019    OB History     Gravida  1   Para      Term      Preterm      AB      Living         SAB      IAB      Ectopic      Multiple      Live Births               Home Medications    Prior to Admission medications   Medication Sig Start Date End Date Taking? Authorizing Provider  oseltamivir (TAMIFLU) 75 MG capsule Take 1 capsule (75 mg total) by mouth every 12 (twelve) hours. 01/12/23  Yes Vevelyn Francois, NP  albuterol (PROVENTIL) (  2.5 MG/3ML) 0.083% nebulizer solution Inhale 3 mLs (2.5 mg total) via nebulizer every 6 (six) hours as needed for wheezing or shortness of breath. 11/09/22   Johnette Abraham, MD  albuterol (VENTOLIN HFA) 108 (90 Base) MCG/ACT inhaler Inhale 1-2 puffs into the lungs every 4 (four) hours as needed for wheezing or shortness of breath. 01/23/21   Parrett, Fonnie Mu, NP  ALPRAZolam (XANAX) 0.25 MG tablet Take 1 tablet (0.25 mg total) by mouth 2 (two) times daily as needed for up to 30 doses for anxiety. 12/24/22   Johnette Abraham, MD  amphetamine-dextroamphetamine (ADDERALL XR) 20 MG 24 hr capsule Take 1 capsule (20 mg total) by mouth as needed. 11/09/22   Johnette Abraham, MD  chlorthalidone (HYGROTON) 25 MG tablet Take 1 tablet (25 mg total) by mouth daily. 05/26/22   Imogene Burn, PA-C  DULoxetine (CYMBALTA) 20 MG capsule TAKE 1 CAPSULE BY MOUTH EVERY DAY 01/05/23   Johnette Abraham, MD  Emollient [facility]) OINT Apply 1 application  topically as needed. 09/22/22   Johnette Abraham, MD  fluticasone (FLONASE) 50 MCG/ACT nasal spray Place 1 spray into both nostrils 2 (two) times daily. 11/09/22   Johnette Abraham, MD  Galcanezumab-gnlm (EMGALITY) 120 MG/ML SOAJ Inject 120 mg into the skin every 30 (thirty) days. 11/16/22   Johnette Abraham, MD  naproxen (NAPROSYN) 500 MG tablet Take 1 tablet (500 mg total) by mouth 2 (two) times daily with a meal.  12/24/22   Johnette Abraham, MD  prochlorperazine (COMPAZINE) 10 MG tablet Take 1 tablet (10 mg total) by mouth every 6 (six) hours as needed for nausea or vomiting (or headache). 11/08/22   Blake Divine, MD  QUEtiapine (SEROQUEL) 25 MG tablet Take 25 mg by mouth at bedtime. 05/29/22   [provider]  SUMAtriptan (IMITREX) 20 MG/ACT nasal spray Place 1 spray (20 mg total) into the nose every 2 (two) hours as needed for migraine or headache. May repeat in 2 hours if headache persists or recurs. Maximum of 40 mg (2 sprays) in 24 hours. Patient not taking: Reported on 11/17/2022 11/15/22   Johnette Abraham, MD  tiZANidine (ZANAFLEX) 2 MG tablet TAKE 1 TAB BY MOUTH EVERY 12 HOURS AS NEEDED FOR UP TO 30 DOSES FOR MUSCLE SPASMS. 12/09/22   Johnette Abraham, MD  Vitamin D, Ergocalciferol, (DRISDOL) 1.25 MG (50000 UNIT) CAPS capsule Take 1 capsule (50,000 Units total) by mouth every 7 (seven) days for 12 doses. 11/22/22 02/08/23  Johnette Abraham, MD    Family History Family History  Adopted: Yes  Problem Relation Age of Onset   Other Son        Growing pains   Asthma Mother    Heart Problems Mother    Migraines Mother    COPD Mother    Heart attack Mother    Diabetes Father    Peptic Ulcer Father    Heart Problems Father    COPD Father    Heart attack Father    Colon cancer Neg Hx    Colon polyps Neg Hx    Esophageal cancer Neg Hx    Rectal cancer Neg Hx    Stomach cancer Neg Hx     Social History Social History   Tobacco Use   Smoking status: Former    Packs/day: 0.25    Years: 17.00    Total pack years: 4.25    Types: Cigarettes    Quit date: 03/02/2013  Years since quitting: 9.8   Smokeless tobacco: Never  Vaping Use   Vaping Use: Never used  Substance Use Topics   Alcohol use: Not Currently   Drug use: Not Currently    Types: Marijuana    Comment: former - 6 yrs ago     Allergies   Aspirin   Review of Systems Review of Systems   Physical  Exam Triage Vital Signs ED Triage Vitals  Enc Vitals Group     BP      Pulse      Resp      Temp      Temp src      SpO2      Weight      Height      Head Circumference      Peak Flow      Pain Score      Pain Loc      Pain Edu?      Excl. in [place]?    No data found.  Updated Vital Signs BP (!) 130/98 (BP Location: Right Arm)   Pulse 82   Temp 98.6 F (37 C) (Oral)   Resp 20   LMP 02/19/2016   SpO2 96%   Visual Acuity Right Eye Distance:   Left Eye Distance:   Bilateral Distance:    Right Eye Near:   Left Eye Near:    Bilateral Near:     Physical Exam Constitutional:      Appearance: She is obese.  HENT:     Head: Normocephalic and atraumatic.     Right Ear: Tympanic membrane normal.     Left Ear: Tympanic membrane normal.     Nose: Nose normal.     Mouth/Throat:     Mouth: Mucous membranes are moist.     Comments: Unable to visualize Cardiovascular:     Rate and Rhythm: Normal rate and regular rhythm.     Pulses: Normal pulses.     Heart sounds: Normal heart sounds.  Pulmonary:     Effort: Pulmonary effort is normal.     Breath sounds: Normal breath sounds.  Musculoskeletal:        General: Normal range of motion.     Cervical back: Normal range of motion.  Skin:    General: Skin is warm.     Capillary Refill: Capillary refill takes less than 2 seconds.  Neurological:     General: No focal deficit present.     Mental Status: She is alert and oriented to person, place, and time.      UC Treatments / Results  Labs (all labs ordered are listed, but only abnormal results are displayed) Labs Reviewed  SARS CORONAVIRUS 2 (TAT 6-24 HRS)  POCT RAPID STREP A (OFFICE)    EKG   Radiology No results found.  Procedures Procedures (including critical care time)  Medications Ordered in UC Medications - No data to display  Initial Impression / Assessment and Plan / UC Course  I have reviewed the triage vital signs and the nursing  notes.  Pertinent labs & imaging results that were available during my care of the patient were reviewed by me and considered in my medical decision making (see chart for details).     Sore throat Final Clinical Impressions(s) / UC Diagnoses   Final diagnoses:  Acute cough  Upper respiratory tract infection, unspecified type  Sore throat     Discharge Instructions      Your Strep Test is  negative. COVID test is pending. You will receive a call from the nurse for positive results for further treatment recommendations.  You may have an Upper Respiratory Infection.  You have opted for treatment of influenza based on symptoms. Tamiflu 75 mg every 12 hours for 5 days has been sent to the pharmacy.  We encourage conservative treatment with symptom relief. We encourage you to use Tylenol alternating with Ibuprofen for your fever if not contraindicated. (Remember to use as directed do not exceed daily dosing recommendations) We also encourage salt water gargles for your sore throat. You should also consider throat lozenges and chloraseptic spray.  Your cough can be soothed with a cough suppressant.  Continue to use the compazine for Nausea and vomiting.       ED Prescriptions     Medication Sig Dispense Auth. Provider   oseltamivir (TAMIFLU) 75 MG capsule Take 1 capsule (75 mg total) by mouth every 12 (twelve) hours. 10 capsule Vevelyn Francois, NP      PDMP not reviewed this encounter.   Dionisio David [place], NP 01/12/23 1030

## 2023-01-12 NOTE — Discharge Instructions (Addendum)
Your Strep Test is negative. COVID test is pending. You will receive a call from the nurse for positive results for further treatment recommendations.  You may have an Upper Respiratory Infection.  You have opted for treatment of influenza based on symptoms. Tamiflu 75 mg every 12 hours for 5 days has been sent to the pharmacy.  We encourage conservative treatment with symptom relief. We encourage you to use Tylenol alternating with Ibuprofen for your fever if not contraindicated. (Remember to use as directed do not exceed daily dosing recommendations) We also encourage salt water gargles for your sore throat. You should also consider throat lozenges and chloraseptic spray.  Your cough can be soothed with a cough suppressant.  Continue to use the compazine for Nausea and vomiting.

## 2023-01-12 NOTE — ED Triage Notes (Signed)
Pt reports she has been exposed to covid and flu x 2 weeks. Now she is having some headache, n/v, body aches, coughing, runny nose, dizziness, and sore throat.

## 2023-01-13 ENCOUNTER — Telehealth: Payer: Self-pay | Admitting: Internal Medicine

## 2023-01-13 ENCOUNTER — Encounter: Payer: Self-pay | Admitting: Internal Medicine

## 2023-01-13 LAB — SARS CORONAVIRUS 2 (TAT 6-24 HRS): SARS Coronavirus 2: POSITIVE — AB

## 2023-01-13 MED ORDER — PAXLOVID (300/100) 20 X 150 MG & 10 X 100MG PO TBPK: 3.0000 | ORAL_TABLET | Freq: Two times a day (BID) | ORAL | 0 refills | Status: AC

## 2023-01-13 NOTE — Telephone Encounter (Signed)
Attempted to reach patient to review Paxlovid prescription, LVM Per Dr. Lanny Cramp, patient will need to STOP her Seroquel for 7 days.   Will not send until I have been able to review with patient

## 2023-01-19 ENCOUNTER — Other Ambulatory Visit: Payer: Self-pay

## 2023-01-19 ENCOUNTER — Emergency Department (HOSPITAL_COMMUNITY)
Admission: EM | Admit: 2023-01-19 | Discharge: 2023-01-19 | Disposition: A | Discharge status: Home / Self Care | Payer: 59 | Attending: Emergency Medicine | Admitting: Emergency Medicine

## 2023-01-19 ENCOUNTER — Emergency Department (HOSPITAL_COMMUNITY): Payer: 59

## 2023-01-19 ENCOUNTER — Encounter (HOSPITAL_COMMUNITY): Payer: Self-pay | Admitting: *Deleted

## 2023-01-19 DIAGNOSIS — U071 COVID-19: Secondary | ICD-10-CM | POA: Insufficient documentation

## 2023-01-19 DIAGNOSIS — R079 Chest pain, unspecified: Secondary | ICD-10-CM | POA: Diagnosis present

## 2023-01-19 LAB — COMPREHENSIVE METABOLIC PANEL
ALT: 14 U/L (ref 0–44)
AST: 21 U/L (ref 15–41)
Albumin: 4.4 g/dL (ref 3.5–5.0)
Alkaline Phosphatase: 65 U/L (ref 38–126)
Anion gap: 8 (ref 5–15)
BUN: 17 mg/dL (ref 6–20)
CO2: 26 mmol/L (ref 22–32)
Calcium: 9.3 mg/dL (ref 8.9–10.3)
Chloride: 104 mmol/L (ref 98–111)
Creatinine, Ser: 1.13 mg/dL — ABNORMAL HIGH (ref 0.44–1.00)
GFR, Estimated: >60 mL/min (ref >60)
Glucose, Bld: 90 mg/dL (ref 70–99)
Potassium: 3.6 mmol/L (ref 3.5–5.1)
Sodium: 138 mmol/L (ref 135–145)
Total Bilirubin: 0.6 mg/dL (ref 0.3–1.2)
Total Protein: 8.5 g/dL — ABNORMAL HIGH (ref 6.5–8.1)

## 2023-01-19 LAB — CBC WITH DIFFERENTIAL/PLATELET
Abs Immature Granulocytes: 0.01 10*3/uL (ref 0.00–0.07)
Basophils Absolute: 0 10*3/uL (ref 0.0–0.1)
Basophils Relative: 0 %
Eosinophils Absolute: 0.2 10*3/uL (ref 0.0–0.5)
Eosinophils Relative: 6 %
HCT: 41.7 % (ref 36.0–46.0)
Hemoglobin: 13.3 g/dL (ref 12.0–15.0)
Immature Granulocytes: 0 %
Lymphocytes Relative: 41 %
Lymphs Abs: 1.4 10*3/uL (ref 0.7–4.0)
MCH: 27.8 pg (ref 26.0–34.0)
MCHC: 31.9 g/dL (ref 30.0–36.0)
MCV: 87.2 fL (ref 80.0–100.0)
Monocytes Absolute: 0.2 10*3/uL (ref 0.1–1.0)
Monocytes Relative: 5 %
Neutro Abs: 1.7 10*3/uL (ref 1.7–7.7)
Neutrophils Relative %: 48 %
Platelets: 194 10*3/uL (ref 150–400)
RBC: 4.78 MIL/uL (ref 3.87–5.11)
RDW: 12.6 % (ref 11.5–15.5)
WBC: 3.5 10*3/uL — ABNORMAL LOW (ref 4.0–10.5)
nRBC: 0 % (ref 0.0–0.2)

## 2023-01-19 LAB — D-DIMER, QUANTITATIVE: D-Dimer, Quant: <0.27 ug/mL-FEU (ref 0.00–0.50)

## 2023-01-19 MED ORDER — DEXAMETHASONE SODIUM PHOSPHATE 10 MG/ML IJ SOLN
10.0000 mg | Freq: Once | INTRAMUSCULAR | Status: AC
  Administered 2023-01-19: 10 mg via INTRAVENOUS
  Filled 2023-01-19: qty 1

## 2023-01-19 MED ORDER — PREDNISONE 20 MG PO TABS: 40.0000 mg | ORAL_TABLET | Freq: Every day | ORAL | 0 refills | Status: DC

## 2023-01-19 MED ORDER — SODIUM CHLORIDE 0.9 % IV BOLUS
1000.0000 mL | Freq: Once | INTRAVENOUS | Status: AC
  Administered 2023-01-19: 1000 mL via INTRAVENOUS

## 2023-01-19 NOTE — ED Triage Notes (Signed)
Pt reports she was diagnosed with Covid last Tuesday but at that time she only had a cough and a "little headache". Yesterday, her symptoms got worse with her headache and cough. She also started having chest pain, dizziness, chills, body aches and fever at night.

## 2023-01-19 NOTE — ED Provider Notes (Signed)
[facility] Provider Note   CSN: [id number] Arrival date & time: 01/19/23  9892     History  Chief Complaint  Patient presents with   Chest Pain    Victoria Moore is a 47 y.o. female.  HPI Patient presents 31s after being diagnosed with COVID, on the same day of completing her Paxlovid now with what she describes as a recurrence of symptoms, including cough, dyspnea, dizziness, chills.  She states that her symptoms are similar, though worse than those she experienced during her initial presentation. No relief with Tylenol. No fever at home, though she does have chills and feverishness.     Home Medications Prior to Admission medications   Medication Sig Start Date End Date Taking? Authorizing Provider  albuterol (PROVENTIL) (2.5 MG/3ML) 0.083% nebulizer solution Inhale 3 mLs (2.5 mg total) via nebulizer every 6 (six) hours as needed for wheezing or shortness of breath. 11/09/22  Yes Johnette Abraham, MD  albuterol (VENTOLIN HFA) 108 (90 Base) MCG/ACT inhaler Inhale 1-2 puffs into the lungs every 4 (four) hours as needed for wheezing or shortness of breath. 01/23/21  Yes Parrett, Fonnie Mu, NP  ALPRAZolam (XANAX) 0.25 MG tablet Take 1 tablet (0.25 mg total) by mouth 2 (two) times daily as needed for up to 30 doses for anxiety. 12/24/22  Yes Johnette Abraham, MD  amphetamine-dextroamphetamine (ADDERALL XR) 20 MG 24 hr capsule Take 1 capsule (20 mg total) by mouth as needed. 11/09/22  Yes Johnette Abraham, MD  chlorthalidone (HYGROTON) 25 MG tablet Take 1 tablet (25 mg total) by mouth daily. 05/26/22  Yes Imogene Burn, PA-C  DULoxetine (CYMBALTA) 20 MG capsule TAKE 1 CAPSULE BY MOUTH EVERY DAY 01/05/23  Yes Johnette Abraham, MD  fluticasone [facility]) 50 MCG/ACT nasal spray Place 1 spray into both nostrils 2 (two) times daily. 11/09/22  Yes Johnette Abraham, MD  predniSONE (DELTASONE) 20 MG tablet Take 2 tablets (40 mg total) by mouth daily  with breakfast. For the next four days 01/19/23  Yes Carmin Muskrat, MD  QUEtiapine (SEROQUEL) 100 MG tablet Take 100 mg by mouth at bedtime.   Yes [provider]  tiZANidine (ZANAFLEX) 2 MG tablet TAKE 1 TAB BY MOUTH EVERY 12 HOURS AS NEEDED FOR UP TO 30 DOSES FOR MUSCLE SPASMS. 12/09/22  Yes Johnette Abraham, MD  Vitamin D, Ergocalciferol, (DRISDOL) 1.25 MG (50000 UNIT) CAPS capsule Take 1 capsule (50,000 Units total) by mouth every 7 (seven) days for 12 doses. 11/22/22 02/08/23 Yes Johnette Abraham, MD  Emollient [facility]) OINT Apply 1 application  topically as needed. Patient not taking: Reported on 01/19/2023 09/22/22   Johnette Abraham, MD  Galcanezumab-gnlm [facility]) 120 MG/ML SOAJ Inject 120 mg into the skin every 30 (thirty) days. Patient not taking: Reported on 01/19/2023 11/16/22   Johnette Abraham, MD  naproxen (NAPROSYN) 500 MG tablet Take 1 tablet (500 mg total) by mouth 2 (two) times daily with a meal. Patient not taking: Reported on 01/19/2023 12/24/22   Johnette Abraham, MD  prochlorperazine (COMPAZINE) 10 MG tablet Take 1 tablet (10 mg total) by mouth every 6 (six) hours as needed for nausea or vomiting (or headache). Patient not taking: Reported on 01/19/2023 11/08/22   Blake Divine, MD  SUMAtriptan [facility]) 20 MG/ACT nasal spray Place 1 spray (20 mg total) into the nose every 2 (two) hours as needed for migraine or headache. May repeat in 2 hours if headache persists  or recurs. Maximum of 40 mg (2 sprays) in 24 hours. Patient not taking: Reported on 11/17/2022 11/15/22   Johnette Abraham, MD      Allergies    Aspirin    Review of Systems   Review of Systems  All other systems reviewed and are negative.   Physical Exam Updated Vital Signs BP 124/89   Pulse (!) 59   Temp 98.7 F (37.1 C) (Oral)   Resp 13   Ht 6' (1.829 m)   Wt 122.5 kg   LMP 02/19/2016   SpO2 100%   BMI 36.62 kg/m  Physical Exam Vitals and nursing note reviewed.  Constitutional:       General: She is not in acute distress.    Appearance: She is well-developed. She is ill-appearing.  HENT:     Head: Normocephalic and atraumatic.  Eyes:     Conjunctiva/sclera: Conjunctivae normal.  Cardiovascular:     Rate and Rhythm: Normal rate and regular rhythm.  Pulmonary:     Effort: Pulmonary effort is normal. No respiratory distress.     Breath sounds: Normal breath sounds. No stridor.  Abdominal:     General: There is no distension.  Skin:    General: Skin is warm and dry.  Neurological:     Mental Status: She is alert and oriented to person, place, and time.     Cranial Nerves: No cranial nerve deficit.  Psychiatric:        Mood and Affect: Mood normal.     ED Results / Procedures / Treatments   Labs (all labs ordered are listed, but only abnormal results are displayed) Labs Reviewed  COMPREHENSIVE METABOLIC PANEL - Abnormal; Notable for the following components:      Result Value   Creatinine, Ser 1.13 (*)    Total Protein 8.5 (*)    All other components within normal limits  CBC WITH DIFFERENTIAL/PLATELET - Abnormal; Notable for the following components:   WBC 3.5 (*)    All other components within normal limits  D-DIMER, QUANTITATIVE    EKG EKG Interpretation  Date/Time:  Wednesday January 19 2023 08:42:31 EST Ventricular Rate:  83 PR Interval:  154 QRS Duration: 77 QT Interval:  369 QTC Calculation: 434 R Axis:   39 Text Interpretation: Sinus rhythm Borderline T abnormalities, anterior leads Abnormal ECG Confirmed by Carmin Muskrat [phone]) on 01/19/2023 9:43:40 AM  Radiology DG Chest Port 1 View  Result Date: 01/19/2023 CLINICAL DATA:  Chest pain and cough.  Coronavirus infection. EXAM: PORTABLE CHEST 1 VIEW COMPARISON:  12/28/2021 FINDINGS: Heart size is normal. Mild tortuosity of the aorta. There may be mild bronchial thickening but there is no infiltrate, collapse or effusion. IMPRESSION: Possible bronchitis. No consolidation or collapse.  Mild tortuosity of the aorta. Electronically Signed   By: Nelson Chimes M.D.   On: 01/19/2023 09:44    Procedures Procedures    Medications Ordered in ED Medications  sodium chloride 0.9 % bolus 1,000 mL (1,000 mLs Intravenous New Bag/Given 01/19/23 0921)  dexamethasone (DECADRON) injection 10 mg (10 mg Intravenous Given 01/19/23 1012)    ED Course/ Medical Decision Making/ A&P                             Medical Decision Making Comfortable appearing female with recent COVID diagnosis presents with COVID illness symptoms including cough, congestion, dyspnea, concern for rebound phenomenon given use of Paxlovid versus persistent infection versus /PE.  Labs monitoring fluids Meds ordered after triage  Amount and/or Complexity of Data Reviewed External Data Reviewed: notes. Labs: ordered. Decision-making details documented in ED Course. Radiology: ordered and independent interpretation performed. Decision-making details documented in ED Course. ECG/medicine tests:  Decision-making details documented in ED Course.  Risk OTC drugs. Prescription drug management. Decision regarding hospitalization.   10:26 AM On repeat exam patient is awake, alert, speaking clearly.  She continues to not require oxygen. I have reviewed all findings, discussed them with her, and x-ray as well.  On monitor the patient has saturation 100% room air normal Cardiac 70 sinus normal  This adult female presents about 1 week after recent COVID diagnosis with recurrence of symptoms.  She is awake, alert, no evidence for bacteremia, sepsis.  No evidence for pneumonia.  No evidence for endorgan changes, DVT/PE. Patient improved here, was discharged in stable condition to follow-up as an outpatient.        Final Clinical Impression(s) / ED Diagnoses Final diagnoses:  COVID    Rx / DC Orders ED Discharge Orders          Ordered    predniSONE (DELTASONE) 20 MG tablet  Daily with breakfast        01/19/23  1025              Carmin Muskrat, MD 01/19/23 1026

## 2023-01-19 NOTE — Discharge Instructions (Signed)
In addition to the prescribed steroids please use your albuterol every 4 hours for the next 2 days.  After the next 2 days you may use it as needed.  Return here for concerning changes in your condition.

## 2023-01-22 ENCOUNTER — Ambulatory Visit
Admission: RE | Admit: 2023-01-22 | Discharge: 2023-01-22 | Disposition: A | Discharge status: Home / Self Care | Payer: 59 | Source: Ambulatory Visit | Attending: Family Medicine | Admitting: Family Medicine

## 2023-01-22 ENCOUNTER — Other Ambulatory Visit: Payer: Self-pay

## 2023-01-22 VITALS — BP 132/85 | HR 74 | Temp 97.9°F | Resp 20

## 2023-01-22 DIAGNOSIS — U099 Post covid-19 condition, unspecified: Secondary | ICD-10-CM

## 2023-01-22 DIAGNOSIS — J209 Acute bronchitis, unspecified: Secondary | ICD-10-CM | POA: Diagnosis not present

## 2023-01-22 DIAGNOSIS — R519 Headache, unspecified: Secondary | ICD-10-CM

## 2023-01-22 MED ORDER — DEXAMETHASONE SODIUM PHOSPHATE 10 MG/ML IJ SOLN
10.0000 mg | Freq: Once | INTRAMUSCULAR | Status: AC
  Administered 2023-01-22: 10 mg via INTRAMUSCULAR

## 2023-01-22 NOTE — ED Triage Notes (Signed)
Pt reports after getting diagnosed with covid on the 19th and she still has been feeling bad.   Pt is having SOB with sitting and moving , headache (migraine) and fatigue x 1 day. Her chest is hurting due to coughing x 8 days. Pt also reports she gets real hot "like she needs air"

## 2023-01-22 NOTE — ED Provider Notes (Signed)
[facility]    CSN: [id number] Arrival date & time: 01/22/23  0846      History   Chief Complaint Chief Complaint  Patient presents with   Fatigue    Chest pain, headache DIZZINESS,  SHORT OF BREATH, HOT SPELLS WITH SWEATS! TESTED POSITVE FOR COVID ON 1.19 AND  NOT FEELING NOR GETTING BETTER.Marland Kitchen FELT BETTER AS I WAS GETTING FLUIDS BUT AFTER I GOT BACK HOME IVE BEEN DOWN SINCE @ - Entered by patient   Chest Pain    HPI Victoria Moore is a 47 y.o. female.   Patient presenting today with 9 to 10-day history of shortness of breath, chest tightness, fatigue, migraine headache.  States she was diagnosed on 01/14/2023 with COVID and given a course of Paxlovid which she completed.  She states overall the COVID symptoms seem to have improved but has been feeling more short of breath, worsened headache and fatigue the past few days.  States this is the third time she has had COVID and she has had something similar after each 1 but this is by far the worst episode.  Went to the emergency department 3 days ago and had a full evaluation to include EKG, labs, chest x-ray which was very reassuring and suggestive of some bronchitis following COVID-19.  She was given a course of prednisone, states this is not resolving her symptoms and was told to increase her albuterol to every 4 hours which she also does not feel is resolving her symptoms.  She has been taking Tylenol for her migraine headache with minimal relief as well.  Denies significant wheezing, fevers, syncope, palpitations, nausea vomiting or diarrhea.    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 Fibromyalgia, doubt sarcoid    Asthma    Blood transfusion without reported diagnosis    Chest wall pain 05/26/2018   Chronic kidney disease     Chronic low back pain with sciatica 09/15/2018   Common migraine with intractable migraine 10/07/2016   Cough variant asthma vs UACS  10/23/2016   - Spirometry 04/15/2016  Very truncated exp loop effort dep portion only  - Allergy profile 10/22/2016 >  Eos 0.2/  IgE  52 RAST POS grass/trees/ragweed  10/22/2016  After extensive coaching HFA effectiveness =    75% try duelra 100 2bid > improved   Daytime sleepiness 05/26/2018   Decreased pedal pulses 10/04/2018   Depression    Depression, major, single episode, moderate ([place]) 05/26/2018   Diabetes ([place])    Dyspnea 04/15/2016   04/15/2016  Walked RA x 3 laps @ 185 ft each stopped due to  End of study, nl pace, no sob or desat    - Spirometry 04/15/2016  Very truncated exp loop effort dep portion only  - 10/22/2016  Walked RA x 3 laps @ 185 ft each stopped due to  End of study, slow pace, min sob/ no desat - full pfts rec 10/22/2016 >>>       Edema    Essential hypertension, benign 11/15/2016   Fibroids 03/11/2016   Frequency of urination 09/11/2018   GERD (gastroesophageal reflux disease)    Herniated lumbar intervertebral disc    Hiatal hernia    Hilar adenopathy  Hypertension    Hypokalemia 06/28/2018   Impaired fasting blood sugar 09/11/2018   Iron deficiency    Leg pain 12/07/2018   Low libido 11/27/2018   Migraine    Mild sleep apnea 08/03/2018   HST 06/20/18  AHI  8.1 / snoring with 02 nadir 80% >  08/03/2018 rec sleep medicine consultation    Morbid (severe) obesity due to excess calories ([place]) 10/23/2016   Personal history of sarcoidosis 05/26/2018   Prolonged capillary refill time    Right leg swelling 08/18/2018   Sarcoidosis    personal history of   Seizures ([place])    history of    Sleep apnea    Spondylosis    Vitamin D deficiency     Patient Active Problem List   Diagnosis Date Noted   Peripheral neuropathy 10/12/2022   MDD (major depressive disorder) 10/12/2022   History of epilepsy 09/22/2022   Preventative health care  09/22/2022   Asthmatic bronchitis 01/23/2021   COVID-19 virus infection 01/23/2021   Social anxiety disorder 10/03/2020   Attention deficit hyperactivity disorder (ADHD), 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   Mild episode of recurrent major depressive disorder ([place]) 10/03/2020   Constipation 05/20/2020   S/P gastric bypass 05/12/2020   Intractable abdominal pain 04/27/2020   Benign intracranial hypertension 02/14/2020   Myopia of both eyes 02/14/2020   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   BMI 45.0-49.9, adult ([place]) 11/29/2019   Other intervertebral disc degeneration, lumbar region 09/19/2019   Complex cyst of left ovary 02/01/2019   Sarcoidosis 02/01/2019   Leg pain 12/07/2018   Low libido 11/27/2018   Prolonged capillary refill time 10/04/2018   Decreased pedal pulses 10/04/2018   Spondylosis 09/15/2018   Chronic low back pain with sciatica 09/15/2018   Impaired fasting blood sugar 09/11/2018   Weight gain 09/11/2018   Mild sleep apnea 08/03/2018   Hypokalemia 06/28/2018   Medication side effect 06/22/2018   Acute low back pain without sciatica 06/22/2018   Personal history of sarcoidosis 05/26/2018   Vitamin D deficiency 05/26/2018   Encounter for health maintenance examination with abnormal findings 05/26/2018   Depression, major, single episode, moderate ([place]) 05/26/2018   Daytime sleepiness 05/26/2018   Chest pain of uncertain etiology 99/37/1696   GERD (gastroesophageal reflux disease) 05/26/2018   Essential hypertension, benign 11/15/2016   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/10/2016   Morbid (severe) obesity due to excess calories ([place]) 10/23/2016   Common migraine with intractable migraine 10/07/2016   DOE (dyspnea on exertion) 04/15/2016   Hilar adenopathy 04/15/2016   Seizures ([place]) 03/11/2016   Anemia 11/12/2014   Iron deficiency 11/12/2014    Past Surgical History:  Procedure Laterality Date   ABDOMINAL HYSTERECTOMY     ESOPHAGEAL MANOMETRY N/A 09/05/2019    Procedure: ESOPHAGEAL MANOMETRY (EM);  Surgeon: Lavena Bullion, DO;  Location: [facility];  Service: Gastroenterology;  Laterality: N/A;   LAPAROSCOPIC OVARIAN CYSTECTOMY Left 03/11/2016   Procedure: LAPAROSCOPIC OVARIAN CYSTECTOMY;  Surgeon: Eldred Manges, MD;  Location: [facility];  Service: Gynecology;  Laterality: Left;   LAPAROSCOPIC VAGINAL HYSTERECTOMY WITH SALPINGECTOMY Bilateral 03/11/2016   Procedure: LAPAROSCOPIC ASSISTED VAGINAL HYSTERECTOMY WITH SALPINGECTOMY;  Surgeon: Eldred Manges, MD;  Location: [facility];  Service: Gynecology;  Laterality: Bilateral;   [place] IMPEDANCE STUDY  09/05/2019   Procedure: [place] IMPEDANCE STUDY;  Surgeon: Lavena Bullion, DO;  Location: [facility];  Service: Gastroenterology;;   TUBAL LIGATION     UPPER GASTROINTESTINAL ENDOSCOPY  10/11/2019   06/2019  OB History     Gravida  1   Para      Term      Preterm      AB      Living         SAB      IAB      Ectopic      Multiple      Live Births               Home Medications    Prior to Admission medications   Medication Sig Start Date End Date Taking? Authorizing Provider  albuterol (PROVENTIL) (2.5 MG/3ML) 0.083% nebulizer solution Inhale 3 mLs (2.5 mg total) via nebulizer every 6 (six) hours as needed for wheezing or shortness of breath. 11/09/22   Johnette Abraham, MD  albuterol (VENTOLIN HFA) 108 (90 Base) MCG/ACT inhaler Inhale 1-2 puffs into the lungs every 4 (four) hours as needed for wheezing or shortness of breath. 01/23/21   Parrett, Fonnie Mu, NP  ALPRAZolam (XANAX) 0.25 MG tablet Take 1 tablet (0.25 mg total) by mouth 2 (two) times daily as needed for up to 30 doses for anxiety. 12/24/22   Johnette Abraham, MD  amphetamine-dextroamphetamine (ADDERALL XR) 20 MG 24 hr capsule Take 1 capsule (20 mg total) by mouth as needed. 11/09/22   Johnette Abraham, MD  chlorthalidone (HYGROTON) 25 MG tablet Take 1 tablet (25 mg total) by mouth daily. 05/26/22   Imogene Burn, PA-C  DULoxetine (CYMBALTA) 20 MG capsule TAKE 1 CAPSULE BY MOUTH EVERY DAY 01/05/23   Johnette Abraham, MD  Emollient [facility]) OINT Apply 1 application  topically as needed. Patient not taking: Reported on 01/19/2023 09/22/22   Johnette Abraham, MD  fluticasone [facility]) 50 MCG/ACT nasal spray Place 1 spray into both nostrils 2 (two) times daily. 11/09/22   Johnette Abraham, MD  Galcanezumab-gnlm (EMGALITY) 120 MG/ML SOAJ Inject 120 mg into the skin every 30 (thirty) days. Patient not taking: Reported on 01/19/2023 11/16/22   Johnette Abraham, MD  naproxen (NAPROSYN) 500 MG tablet Take 1 tablet (500 mg total) by mouth 2 (two) times daily with a meal. Patient not taking: Reported on 01/19/2023 12/24/22   Johnette Abraham, MD  predniSONE (DELTASONE) 20 MG tablet Take 2 tablets (40 mg total) by mouth daily with breakfast. For the next four days 01/19/23   Carmin Muskrat, MD  prochlorperazine (COMPAZINE) 10 MG tablet Take 1 tablet (10 mg total) by mouth every 6 (six) hours as needed for nausea or vomiting (or headache). Patient not taking: Reported on 01/19/2023 11/08/22   Blake Divine, MD  QUEtiapine (SEROQUEL) 100 MG tablet Take 100 mg by mouth at bedtime.    [provider]  SUMAtriptan (IMITREX) 20 MG/ACT nasal spray Place 1 spray (20 mg total) into the nose every 2 (two) hours as needed for migraine or headache. May repeat in 2 hours if headache persists or recurs. Maximum of 40 mg (2 sprays) in 24 hours. Patient not taking: Reported on 11/17/2022 11/15/22   Johnette Abraham, MD  tiZANidine (ZANAFLEX) 2 MG tablet TAKE 1 TAB BY MOUTH EVERY 12 HOURS AS NEEDED FOR UP TO 30 DOSES FOR MUSCLE SPASMS. 12/09/22   Johnette Abraham, MD  Vitamin D, Ergocalciferol, (DRISDOL) 1.25 MG (50000 UNIT) CAPS capsule Take 1 capsule (50,000 Units total) by mouth every 7 (seven) days for 12 doses. 11/22/22 02/08/23  Johnette Abraham, MD    Family History Family  History  Adopted: Yes  Problem Relation  Age of Onset   Other Son        Growing pains   Asthma Mother    Heart Problems Mother    Migraines Mother    COPD Mother    Heart attack Mother    Diabetes Father    Peptic Ulcer Father    Heart Problems Father    COPD Father    Heart attack Father    Colon cancer Neg Hx    Colon polyps Neg Hx    Esophageal cancer Neg Hx    Rectal cancer Neg Hx    Stomach cancer Neg Hx     Social History Social History   Tobacco Use   Smoking status: Former    Packs/day: 0.25    Years: 17.00    Total pack years: 4.25    Types: Cigarettes    Quit date: 03/02/2013    Years since quitting: 9.8   Smokeless tobacco: Never  Vaping Use   Vaping Use: Never used  Substance Use Topics   Alcohol use: Not Currently   Drug use: Not Currently    Types: Marijuana    Comment: former - 6 yrs ago     Allergies   Aspirin and Nsaids   Review of Systems Review of Systems PER HPI  Physical Exam Triage Vital Signs ED Triage Vitals  Enc Vitals Group     BP 01/22/23 0852 132/85     Pulse Rate 01/22/23 0852 74     Resp 01/22/23 0852 20     Temp 01/22/23 0852 97.9 F (36.6 C)     Temp Source 01/22/23 0852 Oral     SpO2 01/22/23 0852 97 %     Weight --      Height --      Head Circumference --      Peak Flow --      Pain Score 01/22/23 0855 10     Pain Loc --      Pain Edu? --      Excl. in [place]? --    No data found.  Updated Vital Signs BP 132/85 (BP Location: Right Arm)   Pulse 74   Temp 97.9 F (36.6 C) (Oral)   Resp 20   LMP 02/19/2016   SpO2 97%   Visual Acuity Right Eye Distance:   Left Eye Distance:   Bilateral Distance:    Right Eye Near:   Left Eye Near:    Bilateral Near:     Physical Exam Vitals and nursing note reviewed.  Constitutional:      Appearance: Normal appearance. She is not ill-appearing.  HENT:     Head: Atraumatic.     Right Ear: Tympanic membrane normal.     Left Ear: Tympanic membrane normal.     Nose: Nose normal.     Mouth/Throat:      Mouth: Mucous membranes are moist.     Pharynx: Oropharynx is clear. No posterior oropharyngeal erythema.  Eyes:     Extraocular Movements: Extraocular movements intact.     Conjunctiva/sclera: Conjunctivae normal.     Pupils: Pupils are equal, round, and reactive to light.  Cardiovascular:     Rate and Rhythm: Normal rate and regular rhythm.     Heart sounds: Normal heart sounds.  Pulmonary:     Effort: Pulmonary effort is normal.     Breath sounds: Normal breath sounds. No wheezing or rales.  Musculoskeletal:  General: Normal range of motion.     Cervical back: Normal range of motion and neck supple.  Skin:    General: Skin is warm and dry.  Neurological:     Mental Status: She is alert and oriented to person, place, and time.     Motor: No weakness.     Gait: Gait normal.  Psychiatric:        Mood and Affect: Mood normal.        Thought Content: Thought content normal.        Judgment: Judgment normal.      UC Treatments / Results  Labs (all labs ordered are listed, but only abnormal results are displayed) Labs Reviewed - No data to display  EKG   Radiology No results found.  Procedures Procedures (including critical care time)  Medications Ordered in UC Medications  dexamethasone (DECADRON) injection 10 mg (10 mg Intramuscular Given 01/22/23 0938)    Initial Impression / Assessment and Plan / UC Course  I have reviewed the triage vital signs and the nursing notes.  Pertinent labs & imaging results that were available during my care of the patient were reviewed by me and considered in my medical decision making (see chart for details).     Overall very well-appearing today with normal vital signs.  Exam very reassuring with no obvious abnormalities.  EKG showing sinus bradycardia at 55 bpm with no acute ST or T wave changes when compared to EKG 3 days ago in the emergency department.  She is finishing up a course of prednisone now, discussed possibly  extending this as she is not fully better or giving a steroid shot in addition and she is opting for a shot of Decadron.  This was given, continue albuterol inhaler, Tylenol, fluids, rest.  Work note extended.  Return for worsening symptoms.  Final Clinical Impressions(s) / UC Diagnoses   Final diagnoses:  Acute bronchitis, unspecified organism  Acute nonintractable headache, unspecified headache type  Post covid-19 condition, unspecified   Discharge Instructions   None    ED Prescriptions   None    PDMP not reviewed this encounter.   Volney American, [place] 01/22/23 1009

## 2023-01-23 ENCOUNTER — Other Ambulatory Visit: Payer: Self-pay | Admitting: Internal Medicine

## 2023-01-23 DIAGNOSIS — F4010 Social phobia, unspecified: Secondary | ICD-10-CM

## 2023-01-24 ENCOUNTER — Other Ambulatory Visit (HOSPITAL_COMMUNITY): Payer: Self-pay

## 2023-01-24 ENCOUNTER — Encounter: Payer: Self-pay | Admitting: Internal Medicine

## 2023-01-24 MED ORDER — FLUTICASONE PROPIONATE 50 MCG/ACT NA SUSP
1.0000 | Freq: Two times a day (BID) | NASAL | 0 refills | Status: DC
  Filled 2023-01-24: qty 16, 30d supply, fill #0

## 2023-01-24 MED ORDER — ALPRAZOLAM 0.25 MG PO TABS
0.2500 mg | ORAL_TABLET | Freq: Two times a day (BID) | ORAL | 0 refills | Status: DC | PRN
  Filled 2023-01-24: qty 30, 15d supply, fill #0

## 2023-01-24 MED ORDER — ALBUTEROL SULFATE (2.5 MG/3ML) 0.083% IN NEBU
2.5000 mg | INHALATION_SOLUTION | Freq: Four times a day (QID) | RESPIRATORY_TRACT | 0 refills | Status: AC | PRN
  Filled 2023-01-24: qty 90, 8d supply, fill #0

## 2023-01-25 ENCOUNTER — Other Ambulatory Visit (HOSPITAL_COMMUNITY): Payer: Self-pay

## 2023-01-25 ENCOUNTER — Encounter: Payer: Self-pay | Admitting: Internal Medicine

## 2023-01-25 ENCOUNTER — Ambulatory Visit (INDEPENDENT_AMBULATORY_CARE_PROVIDER_SITE_OTHER): Payer: 59 | Admitting: Internal Medicine

## 2023-01-25 ENCOUNTER — Other Ambulatory Visit: Payer: Self-pay

## 2023-01-25 VITALS — BP 129/87 | HR 91 | Ht 72.0 in | Wt 246.0 lb

## 2023-01-25 DIAGNOSIS — U071 COVID-19: Secondary | ICD-10-CM | POA: Diagnosis not present

## 2023-01-25 DIAGNOSIS — G43019 Migraine without aura, intractable, without status migrainosus: Secondary | ICD-10-CM

## 2023-01-25 DIAGNOSIS — R3915 Urgency of urination: Secondary | ICD-10-CM | POA: Diagnosis not present

## 2023-01-25 LAB — POCT URINALYSIS DIP (CLINITEK)
Bilirubin, UA: NEGATIVE
Glucose, UA: NEGATIVE mg/dL
Ketones, POC UA: NEGATIVE mg/dL
Nitrite, UA: NEGATIVE
POC PROTEIN,UA: NEGATIVE
Spec Grav, UA: 1.025 (ref 1.010–1.025)
Urobilinogen, UA: 1 E.U./dL
pH, UA: 6 (ref 5.0–8.0)

## 2023-01-25 MED ORDER — SULFAMETHOXAZOLE-TRIMETHOPRIM 800-160 MG PO TABS: 1.0000 | ORAL_TABLET | Freq: Two times a day (BID) | ORAL | 0 refills | Status: DC

## 2023-01-25 NOTE — Assessment & Plan Note (Signed)
Patient diagnosed with COVID on 01/14/2023 and  started on Paxlovid and prednisone.  She presented to the ED after finishing Paxlovid for continued symptoms.  D-dimer was negative and patient was recommended to continue OTC medication for symptoms. She went to ED again on 1/27 and given a decadron shot. She continues to have migraine headaches. This is a chronic problem and review treatment with her. Chest xray was negative for focal pneumonia in ED. No further treatment at this time. Recommended OTC medications for congestion and cough.

## 2023-01-25 NOTE — Patient Instructions (Addendum)
Thank you for trusting me with your care. To recap, today we discussed the following:   Urinary urgency - POCT URINALYSIS DIP (CLINITEK)  Covid-19 -I expect you are still healing from your recent COVID infection.  -You can use Mucinex for congestion and Robitussin for cough

## 2023-01-25 NOTE — Progress Notes (Signed)
   HPI:Ms.Lezlie Ritchey is a 47 y.o. female who presents for evaluation of Migraine (Pt reports migraine headache for about a week now 01/18/2023, also having sx of fatigue and some dizziness. ) and Urinary Tract Infection (Pt reports sx of burning since 01/22/2023.) . For the details of today's visit, please refer to the assessment and plan.  Physical Exam: Vitals:   01/25/23 0843  BP: 129/87  Pulse: 91  SpO2: 97%  Weight: 246 lb (111.6 kg)  Height: 6' (1.829 m)     Physical Exam Constitutional:      General: She is not in acute distress.    Appearance: Normal appearance.  Eyes:     General: No scleral icterus.    Conjunctiva/sclera: Conjunctivae normal.  Cardiovascular:     Rate and Rhythm: Normal rate and regular rhythm.     Heart sounds: No murmur heard.    No friction rub. No gallop.  Pulmonary:     Effort: Pulmonary effort is normal.     Breath sounds: No wheezing, rhonchi or rales.  Musculoskeletal:     Right lower leg: No edema.     Left lower leg: No edema.  Skin:    General: Skin is warm and dry.      Assessment & Plan:   Common migraine with intractable migraine Patient is using Imitrex and this does help with migraines.  She was referred to neurology for evaluation but has not set up an appointment yet.  She is going to call and set up an appointment and continue Imitrex for now.  Urinary urgency Patient has had urinary urgency and dysuria for few days now.  No hematuria. She has recently had COVID-19 and still recovering.   Plan: - POCT urinalysis   COVID-19 virus infection Patient diagnosed with COVID on 01/14/2023 and  started on Paxlovid and prednisone.  She presented to the ED after finishing Paxlovid for continued symptoms.  D-dimer was negative and patient was recommended to continue OTC medication for symptoms. She went to ED again on 1/27 and given a decadron shot. She continues to have migraine headaches. This is a chronic problem and review  treatment with her. Chest xray was negative for focal pneumonia in ED. No further treatment at this time. Recommended OTC medications for congestion and cough.     Lorene Dy, MD

## 2023-01-25 NOTE — Assessment & Plan Note (Signed)
Patient has had urinary urgency and dysuria for few days now.  No hematuria. She has recently had COVID-19 and still recovering.   Plan: - POCT urinalysis

## 2023-01-25 NOTE — Assessment & Plan Note (Signed)
Patient is using Imitrex and this does help with migraines.  She was referred to neurology for evaluation but has not set up an appointment yet.  She is going to call and set up an appointment and continue Imitrex for now.

## 2023-01-26 ENCOUNTER — Telehealth: Payer: Self-pay | Admitting: Internal Medicine

## 2023-01-26 NOTE — Telephone Encounter (Signed)
Letter provided stating patient could return to work.

## 2023-02-23 ENCOUNTER — Other Ambulatory Visit: Payer: Self-pay | Admitting: Internal Medicine

## 2023-02-23 DIAGNOSIS — G43019 Migraine without aura, intractable, without status migrainosus: Secondary | ICD-10-CM

## 2023-03-03 ENCOUNTER — Encounter: Payer: Self-pay | Admitting: Internal Medicine

## 2023-03-03 ENCOUNTER — Other Ambulatory Visit: Payer: Self-pay | Admitting: Internal Medicine

## 2023-03-03 DIAGNOSIS — F4010 Social phobia, unspecified: Secondary | ICD-10-CM

## 2023-03-03 DIAGNOSIS — F900 Attention-deficit hyperactivity disorder, predominantly inattentive type: Secondary | ICD-10-CM

## 2023-03-03 MED ORDER — AMPHETAMINE-DEXTROAMPHET ER 20 MG PO CP24: 20.0000 mg | ORAL_CAPSULE | ORAL | 0 refills | Status: DC | PRN

## 2023-03-03 MED ORDER — ALPRAZOLAM 0.25 MG PO TABS: 0.2500 mg | ORAL_TABLET | Freq: Two times a day (BID) | ORAL | 0 refills | Status: DC | PRN

## 2023-03-03 MED ORDER — AMPHETAMINE-DEXTROAMPHET ER 20 MG PO CP24
20.0000 mg | ORAL_CAPSULE | ORAL | 0 refills | Status: DC | PRN
  Filled 2023-03-03: qty 30, fill #0

## 2023-03-04 ENCOUNTER — Other Ambulatory Visit (HOSPITAL_COMMUNITY): Payer: Self-pay

## 2023-03-05 ENCOUNTER — Encounter: Payer: Self-pay | Admitting: Emergency Medicine

## 2023-03-05 ENCOUNTER — Other Ambulatory Visit: Payer: Self-pay

## 2023-03-05 ENCOUNTER — Ambulatory Visit
Admission: EM | Admit: 2023-03-05 | Discharge: 2023-03-05 | Disposition: A | Discharge status: Home / Self Care | Payer: 59 | Attending: Family Medicine | Admitting: Family Medicine

## 2023-03-05 DIAGNOSIS — J069 Acute upper respiratory infection, unspecified: Secondary | ICD-10-CM

## 2023-03-05 LAB — POCT INFLUENZA A/B
Influenza A, POC: NEGATIVE
Influenza B, POC: NEGATIVE

## 2023-03-05 MED ORDER — FLUTICASONE PROPIONATE 50 MCG/ACT NA SUSP: 1.0000 | Freq: Two times a day (BID) | NASAL | 2 refills | Status: AC

## 2023-03-05 MED ORDER — PROMETHAZINE-DM 6.25-15 MG/5ML PO SYRP: 5.0000 mL | ORAL_SOLUTION | Freq: Four times a day (QID) | ORAL | 0 refills | Status: DC | PRN

## 2023-03-05 NOTE — ED Triage Notes (Signed)
Pt reports cough, nasal congestion, chills, intermittent fever for last several days.

## 2023-03-05 NOTE — ED Provider Notes (Signed)
[facility]    CSN: [id number] Arrival date & time: 03/05/23  0946      History   Chief Complaint Chief Complaint  Patient presents with   Cough    HPI Victoria Moore is a 47 y.o. female.   Presenting today with several day history of cough, congestion, fever, chills, body aches, headache, fatigue.  Denies chest pain, shortness of breath, abdominal pain, nausea vomiting or diarrhea.  So far not trying anything over-the-counter for symptoms.  Multiple sick contacts at work with a viral illness.    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 Fibromyalgia, doubt sarcoid    Asthma    Blood transfusion without reported diagnosis    Chest wall pain 05/26/2018   Chronic kidney disease    Chronic low back pain with sciatica 09/15/2018   Common migraine with intractable migraine 10/07/2016   Cough variant asthma vs UACS  10/23/2016   - Spirometry 04/15/2016  Very truncated exp loop effort dep portion only  - Allergy profile 10/22/2016 >  Eos 0.2/  IgE  52 RAST POS grass/trees/ragweed  10/22/2016  After extensive coaching HFA effectiveness =    75% try duelra 100 2bid > improved   Daytime sleepiness 05/26/2018   Decreased pedal pulses 10/04/2018   Depression    Depression, major, single episode, moderate ([place]) 05/26/2018   Diabetes ([place])    Dyspnea 04/15/2016   04/15/2016  Walked RA x 3 laps @ 185 ft each stopped due to  End of study, nl pace, no sob or desat    - Spirometry 04/15/2016  Very truncated exp loop effort dep portion only  - 10/22/2016  Walked RA x 3 laps @ 185 ft each stopped due to  End of study, slow pace, min sob/ no desat - full pfts rec 10/22/2016 >>>       Edema    Essential hypertension, benign 11/15/2016   Fibroids 03/11/2016   Frequency of urination 09/11/2018   GERD  (gastroesophageal reflux disease)    Herniated lumbar intervertebral disc    Hiatal hernia    Hilar adenopathy    Hypertension    Hypokalemia 06/28/2018   Impaired fasting blood sugar 09/11/2018   Iron deficiency    Leg pain 12/07/2018   Low libido 11/27/2018   Migraine    Mild sleep apnea 08/03/2018   HST 06/20/18  AHI  8.1 / snoring with 02 nadir 80% >  08/03/2018 rec sleep medicine consultation    Morbid (severe) obesity due to excess calories ([place]) 10/23/2016   Personal history of sarcoidosis 05/26/2018   Prolonged capillary refill time    Right leg swelling 08/18/2018   Sarcoidosis    personal history of   Seizures ([place])    history of    Sleep apnea    Spondylosis    Vitamin D deficiency     Patient Active Problem List   Diagnosis Date Noted   Urinary urgency 01/25/2023   Peripheral neuropathy 10/12/2022   MDD (major depressive disorder) 10/12/2022   History of epilepsy 09/22/2022   Preventative health care 09/22/2022   Asthmatic bronchitis 01/23/2021   COVID-19 virus infection 01/23/2021   Social anxiety disorder 10/03/2020   Attention deficit hyperactivity  disorder (ADHD), 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   Mild episode of recurrent major depressive disorder ([place]) 10/03/2020   Constipation 05/20/2020   S/P gastric bypass 05/12/2020   Intractable abdominal pain 04/27/2020   Benign intracranial hypertension 02/14/2020   Myopia of both eyes 02/14/2020   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   BMI 45.0-49.9, adult ([place]) 11/29/2019   Other intervertebral disc degeneration, lumbar region 09/19/2019   Complex cyst of left ovary 02/01/2019   Sarcoidosis 02/01/2019   Leg pain 12/07/2018   Low libido 11/27/2018   Prolonged capillary refill time 10/04/2018   Decreased pedal pulses 10/04/2018   Spondylosis 09/15/2018   Chronic low back pain with sciatica 09/15/2018   Impaired fasting blood sugar 09/11/2018   Weight gain 09/11/2018   Mild sleep apnea 08/03/2018    Hypokalemia 06/28/2018   Medication side effect 06/22/2018   Acute low back pain without sciatica 06/22/2018   Personal history of sarcoidosis 05/26/2018   Vitamin D deficiency 05/26/2018   Encounter for health maintenance examination with abnormal findings 05/26/2018   Depression, major, single episode, moderate ([place]) 05/26/2018   Daytime sleepiness 05/26/2018   Chest pain of uncertain etiology [id number]   GERD (gastroesophageal reflux disease) 05/26/2018   Essential hypertension, benign 11/15/2016   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/10/2016   Morbid (severe) obesity due to excess calories ([place]) 10/23/2016   Common migraine with intractable migraine 10/07/2016   DOE (dyspnea on exertion) 04/15/2016   Hilar adenopathy 04/15/2016   Seizures ([place]) 03/11/2016   Anemia 11/12/2014   Iron deficiency 11/12/2014    Past Surgical History:  Procedure Laterality Date   ABDOMINAL HYSTERECTOMY     ESOPHAGEAL MANOMETRY N/A 09/05/2019   Procedure: ESOPHAGEAL MANOMETRY (EM);  Surgeon: Lavena Bullion, DO;  Location: [facility];  Service: Gastroenterology;  Laterality: N/A;   LAPAROSCOPIC OVARIAN CYSTECTOMY Left 03/11/2016   Procedure: LAPAROSCOPIC OVARIAN CYSTECTOMY;  Surgeon: Eldred Manges, MD;  Location: [facility];  Service: Gynecology;  Laterality: Left;   LAPAROSCOPIC VAGINAL HYSTERECTOMY WITH SALPINGECTOMY Bilateral 03/11/2016   Procedure: LAPAROSCOPIC ASSISTED VAGINAL HYSTERECTOMY WITH SALPINGECTOMY;  Surgeon: Eldred Manges, MD;  Location: [facility];  Service: Gynecology;  Laterality: Bilateral;   [place] IMPEDANCE STUDY  09/05/2019   Procedure: [place] IMPEDANCE STUDY;  Surgeon: Lavena Bullion, DO;  Location: [facility];  Service: Gastroenterology;;   TUBAL LIGATION     UPPER GASTROINTESTINAL ENDOSCOPY  10/11/2019   06/2019    OB History     Gravida  1   Para      Term      Preterm      AB      Living         SAB      IAB      Ectopic      Multiple      Live Births                Home Medications    Prior to Admission medications   Medication Sig Start Date End Date Taking? Authorizing Provider  fluticasone (FLONASE) 50 MCG/ACT nasal spray Place 1 spray into both nostrils 2 (two) times daily. 03/05/23  Yes Volney American, PA-C  promethazine-dextromethorphan (PROMETHAZINE-DM) 6.25-15 MG/5ML syrup Take 5 mLs by mouth 4 (four) times daily as needed. 03/05/23  Yes Volney American, PA-C  albuterol (PROVENTIL) (2.5 MG/3ML) 0.083% nebulizer solution Inhale 3 mLs (2.5 mg total) via nebulizer every 6 (six) hours as needed for wheezing or shortness of breath. 01/24/23  Johnette Abraham, MD  albuterol (VENTOLIN HFA) 108 (90 Base) MCG/ACT inhaler Inhale 1-2 puffs into the lungs every 4 (four) hours as needed for wheezing or shortness of breath. 01/23/21   Parrett, Fonnie Mu, NP  ALPRAZolam (XANAX) 0.25 MG tablet Take 1 tablet (0.25 mg total) by mouth 2 (two) times daily as needed for up to 30 doses for anxiety. 03/03/23   Johnette Abraham, MD  amphetamine-dextroamphetamine (ADDERALL XR) 20 MG 24 hr capsule Take 1 capsule (20 mg total) by mouth as needed. 03/03/23   Johnette Abraham, MD  chlorthalidone (HYGROTON) 25 MG tablet Take 1 tablet (25 mg total) by mouth daily. 05/26/22   Imogene Burn, PA-C  DULoxetine (CYMBALTA) 20 MG capsule TAKE 1 CAPSULE BY MOUTH EVERY DAY 01/05/23   Johnette Abraham, MD  Emollient [facility]) OINT Apply 1 application  topically as needed. 09/22/22   Johnette Abraham, MD  Galcanezumab-gnlm (EMGALITY) 120 MG/ML SOAJ Inject 120 mg into the skin every 30 (thirty) days. 11/16/22   Johnette Abraham, MD  predniSONE (DELTASONE) 20 MG tablet Take 2 tablets (40 mg total) by mouth daily with breakfast. For the next four days 01/19/23   Carmin Muskrat, MD  prochlorperazine (COMPAZINE) 10 MG tablet Take 1 tablet (10 mg total) by mouth every 6 (six) hours as needed for nausea or vomiting (or headache). 11/08/22   Blake Divine, MD  QUEtiapine  (SEROQUEL) 100 MG tablet Take 100 mg by mouth at bedtime.    [provider]  sulfamethoxazole-trimethoprim (BACTRIM DS) 800-160 MG tablet Take 1 tablet by mouth 2 (two) times daily. 01/25/23   Lyndal Pulley, MD  SUMAtriptan (IMITREX) 20 MG/ACT nasal spray Place 1 spray (20 mg total) into the nose every 2 (two) hours as needed for migraine or headache. May repeat in 2 hours if headache persists or recurs. Maximum of 40 mg (2 sprays) in 24 hours. 02/23/23   Johnette Abraham, MD  tiZANidine (ZANAFLEX) 2 MG tablet TAKE 1 TAB BY MOUTH EVERY 12 HOURS AS NEEDED FOR UP TO 30 DOSES FOR MUSCLE SPASMS. 12/09/22   Johnette Abraham, MD    Family History Family History  Adopted: Yes  Problem Relation Age of Onset   Other Son        Growing pains   Asthma Mother    Heart Problems Mother    Migraines Mother    COPD Mother    Heart attack Mother    Diabetes Father    Peptic Ulcer Father    Heart Problems Father    COPD Father    Heart attack Father    Colon cancer Neg Hx    Colon polyps Neg Hx    Esophageal cancer Neg Hx    Rectal cancer Neg Hx    Stomach cancer Neg Hx     Social History Social History   Tobacco Use   Smoking status: Former    Packs/day: 0.25    Years: 17.00    Total pack years: 4.25    Types: Cigarettes    Quit date: 03/02/2013    Years since quitting: 10.0   Smokeless tobacco: Never  Vaping Use   Vaping Use: Never used  Substance Use Topics   Alcohol use: Not Currently   Drug use: Not Currently    Types: Marijuana    Comment: former - 6 yrs ago     Allergies   Aspirin and Nsaids   Review of Systems Review of Systems PER  HPI  Physical Exam Triage Vital Signs ED Triage Vitals  Enc Vitals Group     BP 03/05/23 1016 122/82     Pulse Rate 03/05/23 1016 80     Resp 03/05/23 1016 20     Temp 03/05/23 1016 98.2 F (36.8 C)     Temp Source 03/05/23 1016 Oral     SpO2 03/05/23 1016 99 %     Weight --      Height --      Head Circumference --       Peak Flow --      Pain Score 03/05/23 1015 8     Pain Loc --      Pain Edu? --      Excl. in [place]? --    No data found.  Updated Vital Signs BP 122/82 (BP Location: Right Arm)   Pulse 80   Temp 98.2 F (36.8 C) (Oral)   Resp 20   LMP 02/19/2016   SpO2 99%   Visual Acuity Right Eye Distance:   Left Eye Distance:   Bilateral Distance:    Right Eye Near:   Left Eye Near:    Bilateral Near:     Physical Exam Vitals and nursing note reviewed.  Constitutional:      Appearance: Normal appearance.  HENT:     Head: Atraumatic.     Right Ear: Tympanic membrane and external ear normal.     Left Ear: Tympanic membrane and external ear normal.     Nose: Rhinorrhea present.     Mouth/Throat:     Mouth: Mucous membranes are moist.     Pharynx: Posterior oropharyngeal erythema present.  Eyes:     Extraocular Movements: Extraocular movements intact.     Conjunctiva/sclera: Conjunctivae normal.  Cardiovascular:     Rate and Rhythm: Normal rate and regular rhythm.     Heart sounds: Normal heart sounds.  Pulmonary:     Effort: Pulmonary effort is normal.     Breath sounds: Normal breath sounds. No wheezing or rales.  Musculoskeletal:        General: Normal range of motion.     Cervical back: Normal range of motion and neck supple.  Skin:    General: Skin is warm and dry.  Neurological:     Mental Status: She is alert and oriented to person, place, and time.  Psychiatric:        Mood and Affect: Mood normal.        Thought Content: Thought content normal.      UC Treatments / Results  Labs (all labs ordered are listed, but only abnormal results are displayed) Labs Reviewed  POCT INFLUENZA A/B    EKG   Radiology No results found.  Procedures Procedures (including critical care time)  Medications Ordered in UC Medications - No data to display  Initial Impression / Assessment and Plan / UC Course  I have reviewed the triage vital signs and the nursing  notes.  Pertinent labs & imaging results that were available during my care of the patient were reviewed by me and considered in my medical decision making (see chart for details).     Vitals and exam reassuring, rapid flu negative, COVID test pending.  Discussed likely viral etiology, Phenergan DM, Flonase, supportive over-the-counter medications and home care.  Return for worsening symptoms.  Final Clinical Impressions(s) / UC Diagnoses   Final diagnoses:  Viral URI with cough   Discharge Instructions   None  ED Prescriptions     Medication Sig Dispense Auth. Provider   promethazine-dextromethorphan (PROMETHAZINE-DM) 6.25-15 MG/5ML syrup Take 5 mLs by mouth 4 (four) times daily as needed. 100 mL Volney American, PA-C   fluticasone [facility]) 50 MCG/ACT nasal spray Place 1 spray into both nostrils 2 (two) times daily. 16 g Volney American, [place]      PDMP not reviewed this encounter.   Volney American, [place] 03/05/23 1349

## 2023-03-06 ENCOUNTER — Ambulatory Visit: Payer: Self-pay

## 2023-03-26 ENCOUNTER — Other Ambulatory Visit: Payer: Self-pay | Admitting: Internal Medicine

## 2023-03-26 DIAGNOSIS — F4010 Social phobia, unspecified: Secondary | ICD-10-CM

## 2023-03-28 ENCOUNTER — Emergency Department (HOSPITAL_COMMUNITY)
Admission: EM | Admit: 2023-03-28 | Discharge: 2023-03-29 | Disposition: A | Discharge status: Home / Self Care | Payer: 59 | Attending: Emergency Medicine | Admitting: Emergency Medicine

## 2023-03-28 ENCOUNTER — Other Ambulatory Visit: Payer: Self-pay

## 2023-03-28 DIAGNOSIS — Z1152 Encounter for screening for COVID-19: Secondary | ICD-10-CM | POA: Diagnosis not present

## 2023-03-28 DIAGNOSIS — R0981 Nasal congestion: Secondary | ICD-10-CM | POA: Diagnosis present

## 2023-03-28 DIAGNOSIS — J069 Acute upper respiratory infection, unspecified: Secondary | ICD-10-CM | POA: Insufficient documentation

## 2023-03-28 LAB — RESP PANEL BY RT-PCR (RSV, FLU A&B, COVID)  RVPGX2
Influenza A by PCR: NEGATIVE
Influenza B by PCR: NEGATIVE
Resp Syncytial Virus by PCR: NEGATIVE
SARS Coronavirus 2 by RT PCR: NEGATIVE

## 2023-03-28 MED ORDER — DOXYCYCLINE HYCLATE 100 MG PO CAPS: 100.0000 mg | ORAL_CAPSULE | Freq: Two times a day (BID) | ORAL | 0 refills | Status: DC

## 2023-03-28 NOTE — ED Triage Notes (Signed)
Pt has head congestion > week.  Left ear pain. Productive cough.  Went to urgent care last week and they tx with steriods. She states not getting better

## 2023-03-28 NOTE — Discharge Instructions (Signed)
Begin taking doxycycline as prescribed.  Take over-the-counter medications as needed for relief of symptoms.  Drink plenty of fluids and get plenty of rest.  Follow-up with your primary doctor if not improving in the next week.

## 2023-03-28 NOTE — ED Provider Notes (Signed)
[facility] Provider Note   CSN: [id number] Arrival date & time: 03/28/23  2229     History  Chief Complaint  Patient presents with   Nasal Congestion    Victoria Moore is a 47 y.o. female.  Patient is a 47 year old female with past medical history of gastric bypass, anemia, ADHD.  Patient presenting today with a 1 week history of cough congestion.  She is now having sinus pain and left ear pain.  She was prescribed steroids earlier this week by urgent care, but these are not helping.  She has been using her inhaler and nebulizer with little relief.  Her cough is intermittently productive of a green sputum.  The history is provided by the patient.       Home Medications Prior to Admission medications   Medication Sig Start Date End Date Taking? Authorizing Provider  albuterol (PROVENTIL) (2.5 MG/3ML) 0.083% nebulizer solution Inhale 3 mLs (2.5 mg total) via nebulizer every 6 (six) hours as needed for wheezing or shortness of breath. 01/24/23   Johnette Abraham, MD  albuterol (VENTOLIN HFA) 108 (90 Base) MCG/ACT inhaler Inhale 1-2 puffs into the lungs every 4 (four) hours as needed for wheezing or shortness of breath. 01/23/21   Parrett, Fonnie Mu, NP  ALPRAZolam (XANAX) 0.25 MG tablet TAKE 1 TABLET (0.25 MG TOTAL) BY MOUTH 2 (TWO) TIMES DAILY AS NEEDED FOR UP TO 30 DOSES FOR ANXIETY. 03/28/23   Johnette Abraham, MD  amphetamine-dextroamphetamine (ADDERALL XR) 20 MG 24 hr capsule Take 1 capsule (20 mg total) by mouth as needed. 03/03/23   Johnette Abraham, MD  chlorthalidone (HYGROTON) 25 MG tablet Take 1 tablet (25 mg total) by mouth daily. 05/26/22   Imogene Burn, PA-C  DULoxetine (CYMBALTA) 20 MG capsule TAKE 1 CAPSULE BY MOUTH EVERY DAY 01/05/23   Johnette Abraham, MD  Emollient [facility]) OINT Apply 1 application  topically as needed. 09/22/22   Johnette Abraham, MD  fluticasone (FLONASE) 50 MCG/ACT nasal spray Place 1 spray into both  nostrils 2 (two) times daily. 03/05/23   Volney American, PA-C  Galcanezumab-gnlm (EMGALITY) 120 MG/ML SOAJ Inject 120 mg into the skin every 30 (thirty) days. 11/16/22   Johnette Abraham, MD  predniSONE (DELTASONE) 20 MG tablet Take 2 tablets (40 mg total) by mouth daily with breakfast. For the next four days 01/19/23   Carmin Muskrat, MD  prochlorperazine (COMPAZINE) 10 MG tablet Take 1 tablet (10 mg total) by mouth every 6 (six) hours as needed for nausea or vomiting (or headache). 11/08/22   Blake Divine, MD  promethazine-dextromethorphan (PROMETHAZINE-DM) 6.25-15 MG/5ML syrup Take 5 mLs by mouth 4 (four) times daily as needed. 03/05/23   Volney American, PA-C  QUEtiapine (SEROQUEL) 100 MG tablet Take 100 mg by mouth at bedtime.    [provider]  sulfamethoxazole-trimethoprim (BACTRIM DS) 800-160 MG tablet Take 1 tablet by mouth 2 (two) times daily. 01/25/23   Lyndal Pulley, MD  SUMAtriptan (IMITREX) 20 MG/ACT nasal spray Place 1 spray (20 mg total) into the nose every 2 (two) hours as needed for migraine or headache. May repeat in 2 hours if headache persists or recurs. Maximum of 40 mg (2 sprays) in 24 hours. 02/23/23   Johnette Abraham, MD  tiZANidine (ZANAFLEX) 2 MG tablet TAKE 1 TAB BY MOUTH EVERY 12 HOURS AS NEEDED FOR UP TO 30 DOSES FOR MUSCLE SPASMS. 12/09/22   Johnette Abraham, MD  Allergies    Aspirin and Nsaids    Review of Systems   Review of Systems  All other systems reviewed and are negative.   Physical Exam Updated Vital Signs BP (!) 115/57 (BP Location: Right Arm)   Pulse 80   Temp 99.3 F (37.4 C) (Oral)   Resp 18   Ht 6' (1.829 m)   Wt 123.4 kg   LMP 02/19/2016   SpO2 99%   BMI 36.89 kg/m  Physical Exam Vitals and nursing note reviewed.  Constitutional:      General: She is not in acute distress.    Appearance: She is well-developed. She is not diaphoretic.  HENT:     Head: Normocephalic and atraumatic.     Right Ear: Tympanic  membrane normal.     Ears:     Comments: The left TM is mildly erythematous. Cardiovascular:     Rate and Rhythm: Normal rate and regular rhythm.     Heart sounds: No murmur heard.    No friction rub. No gallop.  Pulmonary:     Effort: Pulmonary effort is normal. No respiratory distress.     Breath sounds: Normal breath sounds. No wheezing.  Abdominal:     General: Bowel sounds are normal. There is no distension.     Palpations: Abdomen is soft.     Tenderness: There is no abdominal tenderness.  Musculoskeletal:        General: Normal range of motion.     Cervical back: Normal range of motion and neck supple.  Skin:    General: Skin is warm and dry.  Neurological:     General: No focal deficit present.     Mental Status: She is alert and oriented to person, place, and time.     ED Results / Procedures / Treatments   Labs (all labs ordered are listed, but only abnormal results are displayed) Labs Reviewed  RESP PANEL BY RT-PCR (RSV, FLU A&B, COVID)  [id number]    EKG None  Radiology No results found.  Procedures Procedures    Medications Ordered in ED Medications - No data to display  ED Course/ Medical Decision Making/ A&P  Patient presenting with URI symptoms as described in the HPI.  She has been trying over-the-counter medications, steroids, and albuterol, but is not having any relief.  She states the symptoms are worsening over the past week.  I will prescribe an antibiotic as she appears to have the beginning of an ear infection.  She is to follow-up as needed if not improving.  Final Clinical Impression(s) / ED Diagnoses Final diagnoses:  None    Rx / DC Orders ED Discharge Orders     None         Veryl Speak, MD 03/28/23 2352

## 2023-03-29 ENCOUNTER — Telehealth: Payer: Self-pay

## 2023-03-29 NOTE — Transitions of Care (Post Inpatient/ED Visit) (Signed)
   03/29/2023  Name: Gracianna Shytle MRN: [id number] DOB: [birth year]  Today's TOC FU Call Status: Today's TOC FU Call Status:: Unsuccessul Call (1st Attempt) Unsuccessful Call (1st Attempt) Date: 03/29/23  Attempted to reach the patient regarding the most recent Inpatient/ED visit.  Follow Up Plan: Additional outreach attempts will be made to reach the patient to complete the Transitions of Care (Post Inpatient/ED visit) call.   Norton Blizzard, [place] ([facility])  [place] [facility] [phone]

## 2023-03-30 ENCOUNTER — Telehealth: Payer: Self-pay

## 2023-03-30 NOTE — Transitions of Care (Post Inpatient/ED Visit) (Signed)
   03/30/2023  Name: Victoria Moore MRN: [id number] DOB: Apr 27, 1976  Today's TOC FU Call Status: Today's TOC FU Call Status:: Unsuccessful Call (2nd Attempt) Unsuccessful Call (1st Attempt) Date: 03/29/23 Unsuccessful Call (2nd Attempt) Date: 03/30/23  Attempted to reach the patient regarding the most recent Inpatient/ED visit.  Follow Up Plan: Additional outreach attempts will be made to reach the patient to complete the Transitions of Care (Post Inpatient/ED visit) call.   Signature  Glenna Durand LPN [place] Team Direct dial:  [phone]

## 2023-04-08 NOTE — Progress Notes (Deleted)
Bethanie Dicker, NP-C Phone: [phone]  Victoria Moore is a 47 y.o. female who presents today for ***  ***  Active Ambulatory Problems    Diagnosis Date Noted   Anemia 11/12/2014   Iron deficiency 11/12/2014   Seizures 03/11/2016   DOE (dyspnea on exertion) 04/15/2016   Hilar adenopathy 04/15/2016   Common migraine with intractable migraine 10/07/2016   Morbid (severe) obesity due to excess calories 10/23/2016   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/10/2016   Essential hypertension, benign 11/15/2016   Personal history of sarcoidosis 05/26/2018   Vitamin D deficiency 05/26/2018   Encounter for health maintenance examination with abnormal findings 05/26/2018   Depression, major, single episode, moderate 05/26/2018   Daytime sleepiness 05/26/2018   Chest pain of uncertain etiology 05/26/2018   GERD (gastroesophageal reflux disease) 05/26/2018   Medication side effect 06/22/2018   Acute low back pain without sciatica 06/22/2018   Hypokalemia 06/28/2018   Mild sleep apnea 08/03/2018   Impaired fasting blood sugar 09/11/2018   Weight gain 09/11/2018   Spondylosis 09/15/2018   Chronic low back pain with sciatica 09/15/2018   Prolonged capillary refill time 10/04/2018   Decreased pedal pulses 10/04/2018   Low libido 11/27/2018   Leg pain 12/07/2018   Other intervertebral disc degeneration, lumbar region 09/19/2019   Complex cyst of left ovary 02/01/2019   Sarcoidosis 02/01/2019   Intractable abdominal pain 04/27/2020   Benign intracranial hypertension 02/14/2020   BMI 45.0-49.9, adult 11/29/2019   Constipation 05/20/2020   Myopia of both eyes 02/14/2020   S/P gastric bypass 05/12/2020   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   Social anxiety disorder 10/03/2020   Attention deficit hyperactivity disorder (ADHD), 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   Mild episode of recurrent major depressive disorder 10/03/2020   Asthmatic bronchitis 01/23/2021   COVID-19 virus infection  01/23/2021   History of epilepsy 09/22/2022   Preventative health care 09/22/2022   Peripheral neuropathy 10/12/2022   MDD (major depressive disorder) 10/12/2022   Urinary urgency 01/25/2023   Resolved Ambulatory Problems    Diagnosis Date Noted   Symptomatic anemia 11/12/2014   Menorrhagia 11/12/2014   Fibroids 03/11/2016   Cough variant asthma vs UACS  10/23/2016   Urinary tract infection without hematuria 05/26/2018   Right leg swelling 08/18/2018   Edema 09/11/2018   Frequency of urination 09/11/2018   Past Medical History:  Diagnosis Date   ADHD    Allergy    Anxiety    Arthritis    Asthma    Blood transfusion without reported diagnosis    Chest wall pain 05/26/2018   Chronic kidney disease    Depression    Diabetes (HCC)    Dyspnea 04/15/2016   Herniated lumbar intervertebral disc    Hiatal hernia    Hypertension    Migraine    Sleep apnea     Family History  Adopted: Yes  Problem Relation Age of Onset   Other Son        Growing pains   Asthma Mother    Heart Problems Mother    Migraines Mother    COPD Mother    Heart attack Mother    Diabetes Father    Peptic Ulcer Father    Heart Problems Father    COPD Father    Heart attack Father    Colon cancer Neg Hx    Colon polyps Neg Hx    Esophageal cancer Neg Hx    Rectal cancer Neg Hx    Stomach cancer Neg  Hx     Social History   Socioeconomic History   Marital status: Significant Other    Spouse name: Not on file   Number of children: 1   Years of education: 33   Highest education level: Not on file  Occupational History   Occupation: [employer]  Tobacco Use   Smoking status: Former    Packs/day: 0.25    Years: 17.00    Additional pack years: 0.00    Total pack years: 4.25    Types: Cigarettes    Quit date: 03/02/2013    Years since quitting: 10.1   Smokeless tobacco: Never  Vaping Use   Vaping Use: Never used  Substance and Sexual Activity   Alcohol use: Not Currently   Drug use: Not  Currently    Types: Marijuana    Comment: former - 6 yrs ago   Sexual activity: Yes    Birth control/protection: Surgical  Other Topics Concern   Not on file  Social History Narrative   She lives w/ her fiance'. She has one son.    Highest level of education:  12th grade, did not graduate. Currently back in school.   Right-handed   Caffeine: 1 cup of coffee some mornings      Social History      Diet? No       Do you drink/eat things with caffeine? yes      Marital status?           Single                          What year were you married?       Do you live in a house, apartment, assisted living, condo, trailer, etc.? house      Is it one or more stories?     How many persons live in your home?  2      Do you have any pets in your home? (please list)   No       Highest level of education completed? Some college       Current or past profession:       Do you exercise?       Yes                                Type & how often? Daily       Advanced Directives      Do you have a living will? No       Do you have a DNR form?                                  If not, do you want to discuss one? No       Do you have signed POA/HPOA for forms?  No       Functional Status      Do you have difficulty bathing or dressing yourself? No       Do you have difficulty preparing food or eating? No       Do you have difficulty managing your medications? No       Do you have difficulty managing your finances? No       Do you have difficulty affording your medications? No       Social Determinants  of Health   Financial Resource Strain: Not on file  Food Insecurity: Not on file  Transportation Needs: Not on file  Physical Activity: Not on file  Stress: Not on file  Social Connections: Not on file  Intimate Partner Violence: Not on file    ROS  General:  Negative for nexplained weight loss, fever Skin: Negative for new or changing mole, sore that won't heal HEENT:  Negative for trouble hearing, trouble seeing, ringing in ears, mouth sores, hoarseness, change in voice, dysphagia. CV:  Negative for chest pain, dyspnea, edema, palpitations Resp: Negative for cough, dyspnea, hemoptysis GI: Negative for nausea, vomiting, diarrhea, constipation, abdominal pain, melena, hematochezia. GU: Negative for dysuria, incontinence, urinary hesitance, hematuria, vaginal or penile discharge, polyuria, sexual difficulty, lumps in testicle or breasts MSK: Negative for muscle cramps or aches, joint pain or swelling Neuro: Negative for headaches, weakness, numbness, dizziness, passing out/fainting Psych: Negative for depression, anxiety, memory problems  Objective  Physical Exam There were no vitals filed for this visit.  BP Readings from Last 3 Encounters:  03/28/23 (!) 115/57  03/05/23 122/82  01/25/23 129/87   Wt Readings from Last 3 Encounters:  03/28/23 272 lb (123.4 kg)  01/25/23 246 lb (111.6 kg)  01/19/23 270 lb (122.5 kg)    Physical Exam   Assessment/Plan:   There are no diagnoses linked to this encounter.  No follow-ups on file.   Bethanie Dicker, NP-C [facility] [employer]

## 2023-04-12 ENCOUNTER — Ambulatory Visit: Payer: 59 | Admitting: Nurse Practitioner

## 2023-04-12 ENCOUNTER — Encounter (HOSPITAL_COMMUNITY): Payer: Self-pay | Admitting: Emergency Medicine

## 2023-04-12 ENCOUNTER — Other Ambulatory Visit: Payer: Self-pay

## 2023-04-12 ENCOUNTER — Emergency Department (HOSPITAL_COMMUNITY)
Admission: EM | Admit: 2023-04-12 | Discharge: 2023-04-12 | Disposition: A | Discharge status: Home / Self Care | Payer: 59 | Attending: Emergency Medicine | Admitting: Emergency Medicine

## 2023-04-12 DIAGNOSIS — R519 Headache, unspecified: Secondary | ICD-10-CM | POA: Diagnosis not present

## 2023-04-12 DIAGNOSIS — Z7951 Long term (current) use of inhaled steroids: Secondary | ICD-10-CM | POA: Insufficient documentation

## 2023-04-12 DIAGNOSIS — Z79899 Other long term (current) drug therapy: Secondary | ICD-10-CM | POA: Insufficient documentation

## 2023-04-12 DIAGNOSIS — K219 Gastro-esophageal reflux disease without esophagitis: Secondary | ICD-10-CM

## 2023-04-12 DIAGNOSIS — I129 Hypertensive chronic kidney disease with stage 1 through stage 4 chronic kidney disease, or unspecified chronic kidney disease: Secondary | ICD-10-CM | POA: Diagnosis not present

## 2023-04-12 DIAGNOSIS — N189 Chronic kidney disease, unspecified: Secondary | ICD-10-CM | POA: Insufficient documentation

## 2023-04-12 DIAGNOSIS — F4010 Social phobia, unspecified: Secondary | ICD-10-CM

## 2023-04-12 DIAGNOSIS — I10 Essential (primary) hypertension: Secondary | ICD-10-CM

## 2023-04-12 DIAGNOSIS — J45909 Unspecified asthma, uncomplicated: Secondary | ICD-10-CM | POA: Diagnosis not present

## 2023-04-12 DIAGNOSIS — E611 Iron deficiency: Secondary | ICD-10-CM

## 2023-04-12 DIAGNOSIS — F321 Major depressive disorder, single episode, moderate: Secondary | ICD-10-CM

## 2023-04-12 DIAGNOSIS — E559 Vitamin D deficiency, unspecified: Secondary | ICD-10-CM

## 2023-04-12 DIAGNOSIS — K0889 Other specified disorders of teeth and supporting structures: Secondary | ICD-10-CM | POA: Diagnosis not present

## 2023-04-12 MED ORDER — DIPHENHYDRAMINE HCL 50 MG/ML IJ SOLN
25.0000 mg | Freq: Once | INTRAMUSCULAR | Status: AC
  Administered 2023-04-12: 25 mg via INTRAVENOUS
  Filled 2023-04-12: qty 1

## 2023-04-12 MED ORDER — PENICILLIN V POTASSIUM 500 MG PO TABS: 500.0000 mg | ORAL_TABLET | Freq: Three times a day (TID) | ORAL | 0 refills | Status: DC

## 2023-04-12 MED ORDER — SODIUM CHLORIDE 0.9 % IV BOLUS
500.0000 mL | Freq: Once | INTRAVENOUS | Status: AC
  Administered 2023-04-12: 500 mL via INTRAVENOUS

## 2023-04-12 MED ORDER — HYDROCODONE-ACETAMINOPHEN 5-325 MG PO TABS: ORAL_TABLET | ORAL | 0 refills | Status: DC

## 2023-04-12 MED ORDER — DEXAMETHASONE SODIUM PHOSPHATE 10 MG/ML IJ SOLN
10.0000 mg | Freq: Once | INTRAMUSCULAR | Status: AC
  Administered 2023-04-12: 10 mg via INTRAVENOUS
  Filled 2023-04-12: qty 1

## 2023-04-12 MED ORDER — METOCLOPRAMIDE HCL 5 MG/ML IJ SOLN
10.0000 mg | Freq: Once | INTRAMUSCULAR | Status: AC
  Administered 2023-04-12: 10 mg via INTRAVENOUS
  Filled 2023-04-12: qty 2

## 2023-04-12 NOTE — Discharge Instructions (Addendum)
As discussed, please contact one of the dentist on the resource list provided to arrange follow-up appointment.  Also follow-up with your primary care provider for recheck.

## 2023-04-12 NOTE — ED Provider Notes (Signed)
[place] EMERGENCY DEPARTMENT AT [facility] Provider Note   CSN: [id number] Arrival date & time: 04/12/23  8469     History  Chief Complaint  Patient presents with   Headache    Victoria Moore is a 47 y.o. female.   Headache Associated symptoms: no abdominal pain, no diarrhea, no dizziness, no fever, no nausea, no neck pain, no neck stiffness, no numbness, no vomiting and no weakness        Victoria Moore is a 47 y.o. female past medical history significant for for migraine headaches, asthma, chronic kidney disease, seizures, hypertension, anxiety, sarcoidosis who presents to the Emergency Department complaining of left-sided headache x 1 week.  States headache has been waxing and waning in severity involves the left side of her face into her forehead and temple area.  She also describes pain radiating to her left ear.  Symptoms have been associated with photophobia and phonophobia, she has had some nausea as well, denies vomiting, abdominal pain, chest pain, dizziness, neck pain or stiffness.  She has taken her prescription triptan medication without relief of her symptoms.  She denies any known dental issues.  Home Medications Prior to Admission medications   Medication Sig Start Date End Date Taking? Authorizing Provider  albuterol (PROVENTIL) (2.5 MG/3ML) 0.083% nebulizer solution Inhale 3 mLs (2.5 mg total) via nebulizer every 6 (six) hours as needed for wheezing or shortness of breath. 01/24/23   Billie Lade, MD  albuterol (VENTOLIN HFA) 108 (90 Base) MCG/ACT inhaler Inhale 1-2 puffs into the lungs every 4 (four) hours as needed for wheezing or shortness of breath. 01/23/21   Parrett, Virgel Bouquet, NP  ALPRAZolam (XANAX) 0.25 MG tablet TAKE 1 TABLET (0.25 MG TOTAL) BY MOUTH 2 (TWO) TIMES DAILY AS NEEDED FOR UP TO 30 DOSES FOR ANXIETY. 03/28/23   Billie Lade, MD  amphetamine-dextroamphetamine (ADDERALL XR) 20 MG 24 hr capsule Take 1 capsule (20 mg total) by mouth as  needed. 03/03/23   Billie Lade, MD  chlorthalidone (HYGROTON) 25 MG tablet Take 1 tablet (25 mg total) by mouth daily. 05/26/22   Dyann Kief, PA-C  doxycycline (VIBRAMYCIN) 100 MG capsule Take 1 capsule (100 mg total) by mouth 2 (two) times daily. One po bid x 7 days 03/28/23   Geoffery Lyons, MD  DULoxetine (CYMBALTA) 20 MG capsule TAKE 1 CAPSULE BY MOUTH EVERY DAY 01/05/23   Billie Lade, MD  [facility]) OINT Apply 1 application  topically as needed. 09/22/22   Billie Lade, MD  fluticasone (FLONASE) 50 MCG/ACT nasal spray Place 1 spray into both nostrils 2 (two) times daily. 03/05/23   Particia Nearing, PA-C  Galcanezumab-gnlm (EMGALITY) 120 MG/ML SOAJ Inject 120 mg into the skin every 30 (thirty) days. 11/16/22   Billie Lade, MD  predniSONE (DELTASONE) 20 MG tablet Take 2 tablets (40 mg total) by mouth daily with breakfast. For the next four days 01/19/23   Gerhard Munch, MD  prochlorperazine (COMPAZINE) 10 MG tablet Take 1 tablet (10 mg total) by mouth every 6 (six) hours as needed for nausea or vomiting (or headache). 11/08/22   Chesley Noon, MD  promethazine-dextromethorphan (PROMETHAZINE-DM) 6.25-15 MG/5ML syrup Take 5 mLs by mouth 4 (four) times daily as needed. 03/05/23   Particia Nearing, PA-C  QUEtiapine (SEROQUEL) 100 MG tablet Take 100 mg by mouth at bedtime.    [provider]  sulfamethoxazole-trimethoprim (BACTRIM DS) 800-160 MG tablet Take 1 tablet by mouth 2 (two)  times daily. 01/25/23   Gardenia Phlegm, MD  SUMAtriptan (IMITREX) 20 MG/ACT nasal spray Place 1 spray (20 mg total) into the nose every 2 (two) hours as needed for migraine or headache. May repeat in 2 hours if headache persists or recurs. Maximum of 40 mg (2 sprays) in 24 hours. 02/23/23   Billie Lade, MD  tiZANidine (ZANAFLEX) 2 MG tablet TAKE 1 TAB BY MOUTH EVERY 12 HOURS AS NEEDED FOR UP TO 30 DOSES FOR MUSCLE SPASMS. 12/09/22   Billie Lade, MD      Allergies     Aspirin and Nsaids    Review of Systems   Review of Systems  Constitutional:  Positive for appetite change. Negative for chills and fever.  Respiratory:  Negative for shortness of breath.   Cardiovascular:  Negative for chest pain.  Gastrointestinal:  Negative for abdominal pain, diarrhea, nausea and vomiting.  Musculoskeletal:  Negative for arthralgias, neck pain and neck stiffness.  Skin:  Negative for rash.  Neurological:  Positive for headaches. Negative for dizziness, facial asymmetry, speech difficulty, weakness and numbness.    Physical Exam Updated Vital Signs BP 117/79   Pulse 65   Temp (!) 97 F (36.1 C)   Resp 18   Ht 6' (1.829 m)   Wt 117.9 kg   LMP 02/19/2016   SpO2 100%   BMI 35.26 kg/m  Physical Exam Vitals and nursing note reviewed.  Constitutional:      General: She is not in acute distress.    Appearance: She is well-developed. She is not ill-appearing or toxic-appearing.  HENT:     Right Ear: Tympanic membrane and ear canal normal.     Left Ear: Ear canal normal.     Mouth/Throat:     Mouth: Mucous membranes are moist.     Dentition: Dental tenderness present.     Pharynx: Oropharynx is clear. Uvula midline. No uvula swelling.     Comments: Tenderness to palpation of the left upper third molar.  No obvious dental caries, but tooth appears impacted.  No edema or erythema of the surrounding gingiva.  Uvula midline nonedematous.  No trismus Eyes:     Extraocular Movements: Extraocular movements intact.     Conjunctiva/sclera: Conjunctivae normal.     Pupils: Pupils are equal, round, and reactive to light.  Neck:     Trachea: Phonation normal.     Meningeal: Kernig's sign absent.  Cardiovascular:     Rate and Rhythm: Normal rate and regular rhythm.     Pulses: Normal pulses.  Pulmonary:     Effort: Pulmonary effort is normal.  Abdominal:     Palpations: Abdomen is soft.     Tenderness: There is no abdominal tenderness.  Musculoskeletal:         General: Normal range of motion.     Cervical back: Normal range of motion. No rigidity. Normal range of motion.  Skin:    General: Skin is warm.     Capillary Refill: Capillary refill takes less than 2 seconds.     Findings: No rash.  Neurological:     General: No focal deficit present.     Mental Status: She is alert.     GCS: GCS eye subscore is 4. GCS verbal subscore is 5. GCS motor subscore is 6.     Cranial Nerves: Cranial nerves 2-12 are intact.     Sensory: Sensation is intact. No sensory deficit.     Motor: Motor function is intact.  No weakness.     Coordination: Coordination is intact.     Comments: CN II through XII intact.  Speech clear.  No facial droop.  No pronator drift.  Mentating well     ED Results / Procedures / Treatments   Labs (all labs ordered are listed, but only abnormal results are displayed) Labs Reviewed - No data to display  EKG None  Radiology No results found.  Procedures Procedures    Medications Ordered in ED Medications  diphenhydrAMINE (BENADRYL) injection 25 mg (has no administration in time range)  metoCLOPramide (REGLAN) injection 10 mg (has no administration in time range)  dexamethasone (DECADRON) injection 10 mg (has no administration in time range)  sodium chloride 0.9 % bolus 500 mL (has no administration in time range)    ED Course/ Medical Decision Making/ A&P                             Medical Decision Making Patient with history of migraine headaches here with headache x 1 week.  Waxing and waning in severity but not resolving.  Has been taking her triptan medication without relief  Headache left-sided with photophobia and phonophobia. On exam, she has what appears to be a impacted left upper molar.  I do not appreciate any erythema or fluctuance of the surrounding gingiva although she is tender to this area into the tooth.  I suspect symptoms of left-sided headache are secondary to dental pain.  Differential would also  include Ludewig's angina, recurrent migraine headache, periapical abscess.  Meningitis, intracranial hemorrhage also considered but felt less likely in this clinical setting  Amount and/or Complexity of Data Reviewed Discussion of management or test interpretation with external provider(s): Patient given IV medication for her headache symptoms, on recheck, she is resting comfortably states her headache has improved.  Vital signs remain stable.  No focal neurodeficits or meningeal signs on my exam.  Suspect headache is secondary to her impacted wisdom tooth.  She appears appropriate for discharge home, will provide short course of antibiotics and recommended close dental follow-up.  Risk Prescription drug management.           Final Clinical Impression(s) / ED Diagnoses Final diagnoses:  Acute nonintractable headache, unspecified headache type  Pain, dental    Rx / DC Orders ED Discharge Orders     None         Rosey Bath 04/12/23 1253    Gerhard Munch, MD 04/12/23 1521

## 2023-04-12 NOTE — ED Triage Notes (Signed)
Pt presents with headache x 3 weeks. Last night started to have left sided mouth and ear pain. Unknown if any dental caries. Decreased appetite for 2 days.

## 2023-04-19 ENCOUNTER — Other Ambulatory Visit: Payer: Self-pay

## 2023-04-19 ENCOUNTER — Emergency Department (HOSPITAL_COMMUNITY)
Admission: EM | Admit: 2023-04-19 | Discharge: 2023-04-19 | Disposition: A | Discharge status: Home / Self Care | Payer: 59 | Attending: Emergency Medicine | Admitting: Emergency Medicine

## 2023-04-19 ENCOUNTER — Encounter (HOSPITAL_COMMUNITY): Payer: Self-pay | Admitting: Emergency Medicine

## 2023-04-19 DIAGNOSIS — J45909 Unspecified asthma, uncomplicated: Secondary | ICD-10-CM | POA: Insufficient documentation

## 2023-04-19 DIAGNOSIS — E1122 Type 2 diabetes mellitus with diabetic chronic kidney disease: Secondary | ICD-10-CM | POA: Diagnosis not present

## 2023-04-19 DIAGNOSIS — I129 Hypertensive chronic kidney disease with stage 1 through stage 4 chronic kidney disease, or unspecified chronic kidney disease: Secondary | ICD-10-CM | POA: Insufficient documentation

## 2023-04-19 DIAGNOSIS — N189 Chronic kidney disease, unspecified: Secondary | ICD-10-CM | POA: Insufficient documentation

## 2023-04-19 DIAGNOSIS — K0889 Other specified disorders of teeth and supporting structures: Secondary | ICD-10-CM | POA: Diagnosis present

## 2023-04-19 LAB — CBG MONITORING, ED: Glucose-Capillary: 141 mg/dL — ABNORMAL HIGH (ref 70–99)

## 2023-04-19 MED ORDER — KETOROLAC TROMETHAMINE 30 MG/ML IJ SOLN
30.0000 mg | Freq: Once | INTRAMUSCULAR | Status: AC
  Administered 2023-04-19: 30 mg via INTRAMUSCULAR
  Filled 2023-04-19: qty 1

## 2023-04-19 MED ORDER — ACETAMINOPHEN 500 MG PO TABS
1000.0000 mg | ORAL_TABLET | Freq: Once | ORAL | Status: AC
  Administered 2023-04-19: 1000 mg via ORAL
  Filled 2023-04-19: qty 2

## 2023-04-19 MED ORDER — CLINDAMYCIN HCL 150 MG PO CAPS: 150.0000 mg | ORAL_CAPSULE | Freq: Four times a day (QID) | ORAL | 0 refills | Status: DC

## 2023-04-19 MED ORDER — HYDROCODONE-ACETAMINOPHEN 5-325 MG PO TABS: 1.0000 | ORAL_TABLET | ORAL | 0 refills | Status: DC | PRN

## 2023-04-19 NOTE — ED Provider Notes (Signed)
[place] EMERGENCY DEPARTMENT AT [facility] Provider Note   CSN: [id number] Arrival date & time: 04/19/23  4098     History  Chief Complaint  Patient presents with   Facial Pain    Victoria Moore is a 47 y.o. female with history of anemia, sarcoidosis, GERD, anxiety, migraines, asthma, depression, hypertension, chronic kidney disease, seizures, sleep apnea, diabetes who presents emergency department complaining of ongoing dental pain.  Patient states one of her left upper teeth has been coming and giving her significant amounts of pain.  She was seen in the ER for same last week and states that she was given an antibiotic and pain medication.  She finished both of these and continues having pain.  She called around to dentist in the area, and is currently on the wait list to be seen in their office.  Denies any fever.  HPI     Home Medications Prior to Admission medications   Medication Sig Start Date End Date Taking? Authorizing Provider  clindamycin (CLEOCIN) 150 MG capsule Take 1 capsule (150 mg total) by mouth every 6 (six) hours. 04/19/23  Yes Gustavo Meditz T, PA-C  HYDROcodone-acetaminophen (NORCO/VICODIN) 5-325 MG tablet Take 1 tablet by mouth every 4 (four) hours as needed for severe pain. 04/19/23  Yes Waverly Chavarria T, PA-C  albuterol (PROVENTIL) (2.5 MG/3ML) 0.083% nebulizer solution Inhale 3 mLs (2.5 mg total) via nebulizer every 6 (six) hours as needed for wheezing or shortness of breath. 01/24/23   Billie Lade, MD  albuterol (VENTOLIN HFA) 108 (90 Base) MCG/ACT inhaler Inhale 1-2 puffs into the lungs every 4 (four) hours as needed for wheezing or shortness of breath. 01/23/21   Parrett, Virgel Bouquet, NP  ALPRAZolam (XANAX) 0.25 MG tablet TAKE 1 TABLET (0.25 MG TOTAL) BY MOUTH 2 (TWO) TIMES DAILY AS NEEDED FOR UP TO 30 DOSES FOR ANXIETY. 03/28/23   Billie Lade, MD  amphetamine-dextroamphetamine (ADDERALL XR) 20 MG 24 hr capsule Take 1 capsule (20 mg total)  by mouth as needed. 03/03/23   Billie Lade, MD  chlorthalidone (HYGROTON) 25 MG tablet Take 1 tablet (25 mg total) by mouth daily. 05/26/22   Dyann Kief, PA-C  DULoxetine (CYMBALTA) 20 MG capsule TAKE 1 CAPSULE BY MOUTH EVERY DAY 01/05/23   Billie Lade, MD  [facility]) OINT Apply 1 application  topically as needed. 09/22/22   Billie Lade, MD  fluticasone (FLONASE) 50 MCG/ACT nasal spray Place 1 spray into both nostrils 2 (two) times daily. 03/05/23   Particia Nearing, PA-C  Galcanezumab-gnlm (EMGALITY) 120 MG/ML SOAJ Inject 120 mg into the skin every 30 (thirty) days. 11/16/22   Billie Lade, MD  predniSONE (DELTASONE) 20 MG tablet Take 2 tablets (40 mg total) by mouth daily with breakfast. For the next four days 01/19/23   Gerhard Munch, MD  prochlorperazine (COMPAZINE) 10 MG tablet Take 1 tablet (10 mg total) by mouth every 6 (six) hours as needed for nausea or vomiting (or headache). 11/08/22   Chesley Noon, MD  promethazine-dextromethorphan (PROMETHAZINE-DM) 6.25-15 MG/5ML syrup Take 5 mLs by mouth 4 (four) times daily as needed. 03/05/23   Particia Nearing, PA-C  QUEtiapine (SEROQUEL) 100 MG tablet Take 100 mg by mouth at bedtime.    [provider]  SUMAtriptan (IMITREX) 20 MG/ACT nasal spray Place 1 spray (20 mg total) into the nose every 2 (two) hours as needed for migraine or headache. May repeat in 2 hours if headache persists  or recurs. Maximum of 40 mg (2 sprays) in 24 hours. 02/23/23   Billie Lade, MD  tiZANidine (ZANAFLEX) 2 MG tablet TAKE 1 TAB BY MOUTH EVERY 12 HOURS AS NEEDED FOR UP TO 30 DOSES FOR MUSCLE SPASMS. 12/09/22   Billie Lade, MD      Allergies    Aspirin and Nsaids    Review of Systems   Review of Systems  Constitutional:  Negative for fever.  HENT:  Positive for dental problem.   All other systems reviewed and are negative.   Physical Exam Updated Vital Signs BP 137/81 (BP Location: Right Arm)   Pulse  (!) 56   Temp 98.6 F (37 C) (Oral)   Resp 18   LMP 02/19/2016   SpO2 100%  Physical Exam Vitals and nursing note reviewed.  Constitutional:      Appearance: Normal appearance.  HENT:     Head: Normocephalic and atraumatic.     Mouth/Throat:     Lips: Pink.     Mouth: Mucous membranes are moist.     Comments: No obvious dental caries.  Impacted left upper molar, without obvious abscess.  No PTA.  No mandibular or sublingual edema. Eyes:     Conjunctiva/sclera: Conjunctivae normal.  Pulmonary:     Effort: Pulmonary effort is normal. No respiratory distress.  Skin:    General: Skin is warm and dry.  Neurological:     Mental Status: She is alert.  Psychiatric:        Mood and Affect: Mood normal.        Behavior: Behavior normal.     ED Results / Procedures / Treatments   Labs (all labs ordered are listed, but only abnormal results are displayed) Labs Reviewed  CBG MONITORING, ED - Abnormal; Notable for the following components:      Result Value   Glucose-Capillary 141 (*)    All other components within normal limits    EKG None  Radiology No results found.  Procedures Procedures    Medications Ordered in ED Medications  acetaminophen (TYLENOL) tablet 1,000 mg (1,000 mg Oral Given 04/19/23 0908)  ketorolac (TORADOL) 30 MG/ML injection 30 mg (30 mg Intramuscular Given 04/19/23 1035)    ED Course/ Medical Decision Making/ A&P                             Medical Decision Making Risk OTC drugs. Prescription drug management.   This patient is a 47 y.o. female who presents to the ED for concern of dental problem.   Differential diagnoses prior to evaluation: Dental abscess, dental caries, dental fracture, PTA, ludwig's angina  Past Medical History / Social History / Additional history: Chart reviewed. Pertinent results include: anemia, sarcoidosis, GERD, anxiety, migraines, asthma, depression, hypertension, chronic kidney disease, seizures, sleep apnea,  diabetes  Reviewed ER visit note from 4/16, patient was given migraine cocktail including Decadron, Reglan, Benadryl, she was discharged with prescription for Norco and penicillin.  Physical Exam: Physical exam performed. The pertinent findings include: Normal vitals, no acute distress, afebrile.  No trismus.  Impacted left upper molar, without obvious infection.  No PTA or evidence of Ludwig's angina.  Disposition: After consideration of the diagnostic results and the patients response to treatment, I feel that emergency department workup does not suggest an emergent condition requiring admission or immediate intervention beyond what has been performed at this time. The plan is: discharge to home with antibiotics to  prevent infection until she can see dentist, encouraged anti-inflammatories, prescribed pain medication.  She is currently on the wait list to see a dentist.  The patient is safe for discharge and has been instructed to return immediately for worsening symptoms, change in symptoms or any other concerns.   Final Clinical Impression(s) / ED Diagnoses Final diagnoses:  Pain, dental    Rx / DC Orders ED Discharge Orders          Ordered    HYDROcodone-acetaminophen (NORCO/VICODIN) 5-325 MG tablet  Every 4 hours PRN        04/19/23 1023    clindamycin (CLEOCIN) 150 MG capsule  Every 6 hours        04/19/23 1023           Portions of this report may have been transcribed using voice recognition software. Every effort was made to ensure accuracy; however, inadvertent computerized transcription errors may be present.    Jeanella Flattery 04/19/23 1046    Gloris Manchester, MD 04/19/23 1710

## 2023-04-19 NOTE — ED Triage Notes (Signed)
Pt c/o upper tooth pain making her whole left of face hurt x 2 weeks. No swelling noted.

## 2023-04-19 NOTE — ED Notes (Signed)
No swelling noted to inside mouth

## 2023-04-19 NOTE — Discharge Instructions (Signed)
You were seen in the emergency department for dental pain.  As we discussed I think your pain is likely related to your impacted tooth. We normally treat this with anti-inflammatories, prescribed pain medication, and antibiotics.   It's incredibly important you follow up with a dentist as soon as possible for definitive treatment.   Continue to monitor how you're doing and return to the ER for new or worsening symptoms such as difficulty swallowing your own saliva, difficulty breathing, or fever.

## 2023-04-20 NOTE — Transitions of Care (Post Inpatient/ED Visit) (Signed)
   04/20/2023  Name: Victoria Moore MRN: [id number] DOB: 02-Nov-1976  Today's TOC FU Call Status: Today's TOC FU Call Status:: Unsuccessful Call (3rd Attempt) Unsuccessful Call (1st Attempt) Date: 03/29/23 Unsuccessful Call (3rd Attempt) Date: 04/20/23  Attempted to reach the patient regarding the most recent Inpatient/ED visit.  Follow Up Plan: No further outreach attempts will be made at this time. We have been unable to contact the patient.  Signature Agnes Lawrence, CMA (AAMA)  [facility] [phone]

## 2023-04-21 ENCOUNTER — Telehealth: Payer: Self-pay

## 2023-04-21 NOTE — Transitions of Care (Post Inpatient/ED Visit) (Signed)
   04/21/2023  Name: Victoria Moore MRN: [id number] DOB: [birth year]  Today's TOC FU Call Status: Today's TOC FU Call Status:: Unsuccessul Call (1st Attempt) Unsuccessful Call (1st Attempt) Date: 04/21/23  Attempted to reach the patient regarding the most recent Inpatient/ED visit.  Follow Up Plan: Additional outreach attempts will be made to reach the patient to complete the Transitions of Care (Post Inpatient/ED visit) call.   Signature Agnes Lawrence, CMA (AAMA)  [facility] [phone]

## 2023-04-26 ENCOUNTER — Ambulatory Visit: Payer: 59 | Admitting: Internal Medicine

## 2023-04-27 NOTE — Transitions of Care (Post Inpatient/ED Visit) (Unsigned)
   04/27/2023  Name: Victoria Moore MRN: [id number] DOB: [birth year] Today's TOC FU Call Status: Today's TOC FU Call Status:: Unsuccessful Call (2nd Attempt) Unsuccessful Call (1st Attempt) Date: 04/21/23 Unsuccessful Call (2nd Attempt) Date: 04/27/23  Attempted to reach the patient regarding the most recent Inpatient/ED visit.  Follow Up Plan: Additional outreach attempts will be made to reach the patient to complete the Transitions of Care (Post Inpatient/ED visit) call.   Signature Agnes Lawrence, CMA (AAMA)  [facility] [phone]

## 2023-04-28 NOTE — Transitions of Care (Post Inpatient/ED Visit) (Signed)
   04/28/2023  Name: Victoria Moore MRN: [id number] DOB: 10-27-1976  Today's TOC FU Call Status: Today's TOC FU Call Status:: Unsuccessful Call (3rd Attempt) Unsuccessful Call (1st Attempt) Date: 04/21/23 Unsuccessful Call (2nd Attempt) Date: 04/27/23 Unsuccessful Call (3rd Attempt) Date: 04/28/23  Attempted to reach the patient regarding the most recent Inpatient/ED visit.  Follow Up Plan: No further outreach attempts will be made at this time. We have been unable to contact the patient.  Signature Agnes Lawrence, CMA (AAMA)  [facility] [phone]

## 2023-05-06 ENCOUNTER — Other Ambulatory Visit: Payer: Self-pay

## 2023-05-06 ENCOUNTER — Encounter (HOSPITAL_COMMUNITY): Payer: Self-pay | Admitting: *Deleted

## 2023-05-06 ENCOUNTER — Emergency Department (HOSPITAL_COMMUNITY)
Admission: EM | Admit: 2023-05-06 | Discharge: 2023-05-06 | Discharge status: Against Medical Advice | Payer: 59 | Attending: Emergency Medicine | Admitting: Emergency Medicine

## 2023-05-06 DIAGNOSIS — G43001 Migraine without aura, not intractable, with status migrainosus: Secondary | ICD-10-CM | POA: Insufficient documentation

## 2023-05-06 DIAGNOSIS — R110 Nausea: Secondary | ICD-10-CM | POA: Diagnosis present

## 2023-05-06 DIAGNOSIS — Z5321 Procedure and treatment not carried out due to patient leaving prior to being seen by health care provider: Secondary | ICD-10-CM | POA: Insufficient documentation

## 2023-05-06 NOTE — ED Provider Notes (Signed)
Patient left AMA before I was able to see your   Victoria Berkshire, MD 05/06/23 [phone]

## 2023-05-06 NOTE — ED Triage Notes (Signed)
Pt c/o migraine x 3 weeks  Pt c/o feeling nauseous

## 2023-05-06 NOTE — ED Notes (Signed)
Pt informed staff that she is leaving to go to Urgent Care.

## 2023-05-09 ENCOUNTER — Encounter (INDEPENDENT_AMBULATORY_CARE_PROVIDER_SITE_OTHER): Payer: 59 | Admitting: Internal Medicine

## 2023-05-09 ENCOUNTER — Telehealth: Payer: Self-pay

## 2023-05-09 NOTE — Transitions of Care (Post Inpatient/ED Visit) (Signed)
   05/09/2023  Name: Victoria Moore MRN: [id number] DOB: 02-27-1976  Today's TOC FU Call Status: Today's TOC FU Call Status:: Unsuccessul Call (1st Attempt) Unsuccessful Call (1st Attempt) Date: 05/09/23  Attempted to reach the patient regarding the most recent Inpatient/ED visit.  Follow Up Plan: Additional outreach attempts will be made to reach the patient to complete the Transitions of Care (Post Inpatient/ED visit) call.   Signature  Agnes Lawrence, CMA (AAMA)  [facility] [phone]

## 2023-05-10 NOTE — Transitions of Care (Post Inpatient/ED Visit) (Signed)
   05/10/2023  Name: Victoria Moore MRN: [id number] DOB: October 08, 1976  Today's TOC FU Call Status: Today's TOC FU Call Status:: Unsuccessful Call (2nd Attempt) Unsuccessful Call (1st Attempt) Date: 05/09/23 Unsuccessful Call (2nd Attempt) Date: 05/10/23  Attempted to reach the patient regarding the most recent Inpatient/ED visit.  Follow Up Plan: Additional outreach attempts will be made to reach the patient to complete the Transitions of Care (Post Inpatient/ED visit) call.   Signature  Agnes Lawrence, CMA (AAMA)  [facility] [phone]

## 2023-05-20 ENCOUNTER — Other Ambulatory Visit: Payer: Self-pay | Admitting: Internal Medicine

## 2023-05-20 DIAGNOSIS — E559 Vitamin D deficiency, unspecified: Secondary | ICD-10-CM

## 2023-05-22 ENCOUNTER — Other Ambulatory Visit: Payer: Self-pay | Admitting: Internal Medicine

## 2023-05-24 ENCOUNTER — Encounter: Payer: Self-pay | Admitting: Internal Medicine

## 2023-05-24 ENCOUNTER — Other Ambulatory Visit: Payer: Self-pay | Admitting: Internal Medicine

## 2023-05-24 DIAGNOSIS — F4010 Social phobia, unspecified: Secondary | ICD-10-CM

## 2023-05-24 MED ORDER — ALPRAZOLAM 0.25 MG PO TABS: 0.2500 mg | ORAL_TABLET | Freq: Two times a day (BID) | ORAL | 0 refills | Status: DC | PRN

## 2023-05-24 MED ORDER — TIZANIDINE HCL 4 MG PO CAPS: 4.0000 mg | ORAL_CAPSULE | Freq: Two times a day (BID) | ORAL | 0 refills | Status: DC | PRN

## 2023-05-25 ENCOUNTER — Ambulatory Visit: Payer: 59 | Admitting: Internal Medicine

## 2023-05-27 ENCOUNTER — Other Ambulatory Visit: Payer: Self-pay | Admitting: Internal Medicine

## 2023-06-01 ENCOUNTER — Other Ambulatory Visit: Payer: Self-pay | Admitting: Internal Medicine

## 2023-06-04 ENCOUNTER — Other Ambulatory Visit: Payer: Self-pay | Admitting: Physician Assistant

## 2023-06-20 ENCOUNTER — Other Ambulatory Visit: Payer: Self-pay | Admitting: Physician Assistant

## 2023-06-25 ENCOUNTER — Other Ambulatory Visit: Payer: Self-pay | Admitting: Internal Medicine

## 2023-06-25 DIAGNOSIS — F4010 Social phobia, unspecified: Secondary | ICD-10-CM

## 2023-06-27 ENCOUNTER — Other Ambulatory Visit: Payer: Self-pay | Admitting: Internal Medicine

## 2023-06-28 ENCOUNTER — Other Ambulatory Visit: Payer: Self-pay | Admitting: Internal Medicine

## 2023-06-28 ENCOUNTER — Ambulatory Visit (INDEPENDENT_AMBULATORY_CARE_PROVIDER_SITE_OTHER): Payer: 59 | Admitting: Internal Medicine

## 2023-06-28 ENCOUNTER — Encounter: Payer: Self-pay | Admitting: Internal Medicine

## 2023-06-28 VITALS — BP 123/81 | HR 68 | Ht 71.0 in | Wt 232.1 lb

## 2023-06-28 DIAGNOSIS — F900 Attention-deficit hyperactivity disorder, predominantly inattentive type: Secondary | ICD-10-CM | POA: Diagnosis not present

## 2023-06-28 DIAGNOSIS — M5441 Lumbago with sciatica, right side: Secondary | ICD-10-CM | POA: Diagnosis not present

## 2023-06-28 DIAGNOSIS — F3341 Major depressive disorder, recurrent, in partial remission: Secondary | ICD-10-CM

## 2023-06-28 DIAGNOSIS — G8929 Other chronic pain: Secondary | ICD-10-CM

## 2023-06-28 DIAGNOSIS — M5442 Lumbago with sciatica, left side: Secondary | ICD-10-CM

## 2023-06-28 MED ORDER — TIZANIDINE HCL 2 MG PO TABS
2.0000 mg | ORAL_TABLET | Freq: Four times a day (QID) | ORAL | 0 refills | Status: DC | PRN
Start: 2023-06-28 — End: 2023-09-07

## 2023-06-28 MED ORDER — QUETIAPINE FUMARATE 100 MG PO TABS: 100.0000 mg | ORAL_TABLET | Freq: Every day | ORAL | 3 refills | Status: DC

## 2023-06-28 MED ORDER — DULOXETINE HCL 40 MG PO CPEP: 40.0000 mg | ORAL_CAPSULE | Freq: Every day | ORAL | 1 refills | Status: DC

## 2023-06-28 MED ORDER — AMPHETAMINE-DEXTROAMPHET ER 20 MG PO CP24: 20.0000 mg | ORAL_CAPSULE | ORAL | 0 refills | Status: DC | PRN

## 2023-06-28 NOTE — Progress Notes (Signed)
HPI:Victoria Moore is a 47 y.o. female who presents with pain in lower back and pain in both thighs. She has chronic back pain. This pain worsened 2 to 3 days ago. The pain is an aching pain. She has not done anything she can think of which has caused this pain. The pain is worse with moving and improved with sitting. It is rated 10/10 pain. She is out of Adderall. She has recently started school. Dr.Dixon last prescribed in March on review of PDMP. Discussed with patient and she missed follow up so it has not been refilled.  She has been referred to psychiatry. She has not established with psychiatry because she had problem with her insurance coverage. Insurance problem is resolved and she needs a new referral.  She also needs refill on Seroquel. For the details of today's visit, please refer to the assessment and plan.    Physical Exam: Vitals:   06/28/23 0841  BP: 123/81  Pulse: 68  SpO2: 98%  Weight: 232 lb 1.9 oz (105.3 kg)  Height: 5\' 11"  (1.803 m)     Physical Exam Constitutional:      General: She is not in acute distress.    Appearance: She is obese.  Cardiovascular:     Rate and Rhythm: Normal rate and regular rhythm.     Heart sounds: No murmur heard. Pulmonary:     Effort: Pulmonary effort is normal.     Breath sounds: No wheezing or rales.  Musculoskeletal:     Comments: No gross deformity, scoliosis. TTP lower back.  No midline or bony TTP. FROM. Strength LEs 5/5 all muscle groups.   2+ MSRs in patellar and achilles tendons, equal bilaterally. Negative SLRs. Sensation intact to light touch bilaterally.    Psychiatric:        Mood and Affect: Mood is depressed. Affect is flat.        Behavior: Behavior normal.      Assessment & Plan:   [place] was seen today for follow-up.  Chronic midline low back pain with bilateral sciatica Assessment & Plan: Chronic problem with exacerbation She has seen PT previously, but not in over a year. Reviewed PCP  notes under this problem and see they have discussed patient may need referral to pain management. Will refer to PT and treat acute exacerbation. Muscle relaxer has helped pain previously. 4 mg Zanaflex capusule did not work as well as 2 mg tablets previously prescribed, prescibed  2 mg tablets q6hours as needed for muscle spasm.  Increase duloxetine to 40 mg daily.  Encourage weight loss  If not improving patient will follow up with PCP to discuss referral to pain management.   Orders: -     Ambulatory referral to Physical Therapy -     DULoxetine HCl; Take 1 capsule (40 mg total) by mouth daily.  Dispense: 90 capsule; Refill: 1 -     tiZANidine HCl; Take 1 tablet (2 mg total) by mouth every 6 (six) hours as needed for muscle spasms.  Dispense: 30 tablet; Refill: 0  Attention deficit hyperactivity disorder (ADHD), predominantly inattentive type Assessment & Plan: Chronic problem.Stable Refilled Adderall for 30 days She will need to follow up with Dr.Dixon to discuss future prescribing.  Referral placed to psychiatry and informed patient will need to be referred for formal testing. I am going to message our referral coordinator Shirlee Limerick) to see where this can be done.   Orders: -     Amphetamine-Dextroamphet ER; Take 1  capsule (20 mg total) by mouth as needed.  Dispense: 30 capsule; Refill: 0  Recurrent major depressive disorder, in partial remission (HCC) Assessment & Plan: PHQ 9 score is 15, down from 23 when I saw patient 09/2022. Duloxetine started at that visit.  Chronic problem , uncontrolled Refilled Quetiapine today Increased Duloxetine to 40 mg ( also treating chronic back pain)  Referral placed to psychiatry   Orders: -     QUEtiapine Fumarate; Take 1 tablet (100 mg total) by mouth at bedtime.  Dispense: 90 tablet; Refill: 3 -     Ambulatory referral to Psychiatry      Milus Banister, MD

## 2023-06-28 NOTE — Patient Instructions (Signed)
Thank you, [name] for allowing [place] to provide your care today.   Refilled  - amphetamine-dextroamphetamine (ADDERALL XR) 20 MG 24 hr capsule; Take 1 capsule (20 mg total) by mouth as needed.  Dispense: 30 capsule; Refill: 0 - follow up with Dr.Dixon to discuss further prescribing  Chronic midline low back pain with bilateral sciatica - Ambulatory referral to Physical Therapy - DULoxetine 40 MG CPEP; Take 1 capsule (40 mg total) by mouth daily.  Dispense: 90 capsule; Refill: 1 - tiZANidine (ZANAFLEX) 2 MG tablet; Take 1 tablet (2 mg total) by mouth every 6 (six) hours as needed for muscle spasms.  Dispense: 30 tablet; Refill: 0  Depression - QUEtiapine (SEROQUEL) 100 MG tablet; Take 1 tablet (100 mg total) by mouth at bedtime.  Dispense: 90 tablet; Refill: 3 - Ambulatory referral to Psychiatry   Referrals ordered today:    Referral Orders         Ambulatory referral to Physical Therapy         Ambulatory referral to Psychiatry       Reminders: Follow up in one month with Dr. Lawana Chambers, M.D.

## 2023-07-01 ENCOUNTER — Ambulatory Visit: Payer: Self-pay

## 2023-07-03 NOTE — Assessment & Plan Note (Addendum)
PHQ 9 score is 15, down from 23 when I saw patient 09/2022. Duloxetine started at that visit.  Chronic problem , uncontrolled Refilled Quetiapine today Increased Duloxetine to 40 mg ( also treating chronic back pain)  Referral placed to psychiatry

## 2023-07-03 NOTE — Assessment & Plan Note (Addendum)
Chronic problem with exacerbation She has seen PT previously, but not in over a year. Reviewed PCP notes under this problem and see they have discussed patient may need referral to pain management. Will refer to PT and treat acute exacerbation. Muscle relaxer has helped pain previously. 4 mg Zanaflex capusule did not work as well as 2 mg tablets previously prescribed, prescibed  2 mg tablets q6hours as needed for muscle spasm.  Increase duloxetine to 40 mg daily.  Encourage weight loss  If not improving patient will follow up with PCP to discuss referral to pain management.

## 2023-07-03 NOTE — Assessment & Plan Note (Signed)
Chronic problem.Stable Refilled Adderall for 30 days She will need to follow up with Dr.Dixon to discuss future prescribing.  Referral placed to psychiatry and informed patient will need to be referred for formal testing. I am going to message our referral coordinator Shirlee Limerick) to see where this can be done.

## 2023-07-04 ENCOUNTER — Other Ambulatory Visit: Payer: Self-pay | Admitting: Internal Medicine

## 2023-07-04 DIAGNOSIS — F900 Attention-deficit hyperactivity disorder, predominantly inattentive type: Secondary | ICD-10-CM

## 2023-07-04 NOTE — Progress Notes (Signed)
Referral placed for ADHD testing.

## 2023-07-24 ENCOUNTER — Other Ambulatory Visit: Payer: Self-pay | Admitting: Physician Assistant

## 2023-07-26 ENCOUNTER — Other Ambulatory Visit: Payer: Self-pay | Admitting: Internal Medicine

## 2023-07-26 DIAGNOSIS — G8929 Other chronic pain: Secondary | ICD-10-CM

## 2023-07-27 ENCOUNTER — Emergency Department (HOSPITAL_COMMUNITY)
Admission: EM | Admit: 2023-07-27 | Discharge: 2023-07-27 | Disposition: A | Discharge status: Home / Self Care | Payer: 59 | Attending: Emergency Medicine | Admitting: Emergency Medicine

## 2023-07-27 ENCOUNTER — Other Ambulatory Visit: Payer: Self-pay

## 2023-07-27 DIAGNOSIS — S1086XA Insect bite of other specified part of neck, initial encounter: Secondary | ICD-10-CM | POA: Insufficient documentation

## 2023-07-27 DIAGNOSIS — W540XXA Bitten by dog, initial encounter: Secondary | ICD-10-CM | POA: Insufficient documentation

## 2023-07-27 DIAGNOSIS — L299 Pruritus, unspecified: Secondary | ICD-10-CM | POA: Insufficient documentation

## 2023-07-27 MED ORDER — DOXYCYCLINE HYCLATE 100 MG PO CAPS: 100.0000 mg | ORAL_CAPSULE | Freq: Two times a day (BID) | ORAL | 0 refills | Status: DC

## 2023-07-27 MED ORDER — DEXAMETHASONE SODIUM PHOSPHATE 10 MG/ML IJ SOLN
10.0000 mg | Freq: Once | INTRAMUSCULAR | Status: AC
  Administered 2023-07-27: 10 mg via INTRAMUSCULAR
  Filled 2023-07-27: qty 1

## 2023-07-27 NOTE — ED Provider Notes (Signed)
[place] EMERGENCY DEPARTMENT AT [facility] Provider Note   CSN: [id number] Arrival date & time: 07/27/23  8295     History  No chief complaint on file.   Victoria Moore is a 47 y.o. female.  Patient presents for swelling/rash on the right side of her neck.  Symptoms present for 2 days.  She reports that there is some itch and mild pain.  No tongue swelling, throat swelling.  She has been taking Benadryl but it is not helping.       Home Medications Prior to Admission medications   Medication Sig Start Date End Date Taking? Authorizing Provider  doxycycline (VIBRAMYCIN) 100 MG capsule Take 1 capsule (100 mg total) by mouth 2 (two) times daily. 07/27/23  Yes Zykia Walla, Canary Brim, MD  albuterol (PROVENTIL) (2.5 MG/3ML) 0.083% nebulizer solution Inhale 3 mLs (2.5 mg total) via nebulizer every 6 (six) hours as needed for wheezing or shortness of breath. 01/24/23   Billie Lade, MD  albuterol (VENTOLIN HFA) 108 (90 Base) MCG/ACT inhaler Inhale 1-2 puffs into the lungs every 4 (four) hours as needed for wheezing or shortness of breath. 01/23/21   Parrett, Virgel Bouquet, NP  ALPRAZolam (XANAX) 0.25 MG tablet TAKE 1 TABLET (0.25 MG TOTAL) BY MOUTH 2 (TWO) TIMES DAILY AS NEEDED FOR UP TO 30 DOSES FOR ANXIETY. 06/27/23   Billie Lade, MD  amphetamine-dextroamphetamine (ADDERALL XR) 20 MG 24 hr capsule Take 1 capsule (20 mg total) by mouth as needed. 06/28/23   Gardenia Phlegm, MD  chlorthalidone (HYGROTON) 25 MG tablet Take 1 tablet (25 mg total) by mouth daily. Pt needs to schedule appt with provider for further refills - 2nd attempt 07/25/23   Dyann Kief, PA-C  DULoxetine (CYMBALTA) 30 MG capsule TAKE 1 CAPSULE BY MOUTH EVERY DAY 07/26/23   Billie Lade, MD  [facility]) OINT Apply 1 application  topically as needed. 09/22/22   Billie Lade, MD  fluticasone (FLONASE) 50 MCG/ACT nasal spray Place 1 spray into both nostrils 2 (two) times daily. 03/05/23   Particia Nearing, PA-C  Galcanezumab-gnlm (EMGALITY) 120 MG/ML SOAJ Inject 120 mg into the skin every 30 (thirty) days. 11/16/22   Billie Lade, MD  prochlorperazine (COMPAZINE) 10 MG tablet Take 1 tablet (10 mg total) by mouth every 6 (six) hours as needed for nausea or vomiting (or headache). 11/08/22   Chesley Noon, MD  QUEtiapine (SEROQUEL) 100 MG tablet Take 1 tablet (100 mg total) by mouth at bedtime. 06/28/23   Gardenia Phlegm, MD  SUMAtriptan (IMITREX) 20 MG/ACT nasal spray Place 1 spray (20 mg total) into the nose every 2 (two) hours as needed for migraine or headache. May repeat in 2 hours if headache persists or recurs. Maximum of 40 mg (2 sprays) in 24 hours. 02/23/23   Billie Lade, MD  tiZANidine (ZANAFLEX) 2 MG tablet Take 1 tablet (2 mg total) by mouth every 6 (six) hours as needed for muscle spasms. 06/28/23   Gardenia Phlegm, MD      Allergies    Aspirin and Nsaids    Review of Systems   Review of Systems  Physical Exam Updated Vital Signs BP (!) 138/95   Pulse 60   Temp 98.8 F (37.1 C)   Resp 19   Ht 5\' 11"  (1.803 m)   Wt 104.3 kg   LMP 02/19/2016   SpO2 99%   BMI 32.08 kg/m  Physical Exam Vitals and nursing  note reviewed.  Constitutional:      General: She is not in acute distress.    Appearance: She is well-developed.  HENT:     Head: Normocephalic and atraumatic.     Mouth/Throat:     Mouth: Mucous membranes are moist.  Eyes:     General: Vision grossly intact. Gaze aligned appropriately.     Extraocular Movements: Extraocular movements intact.     Conjunctiva/sclera: Conjunctivae normal.  Cardiovascular:     Rate and Rhythm: Normal rate and regular rhythm.     Pulses: Normal pulses.     Heart sounds: Normal heart sounds, S1 normal and S2 normal. No murmur heard.    No friction rub. No gallop.  Pulmonary:     Effort: Pulmonary effort is normal. No respiratory distress.     Breath sounds: Normal breath sounds.  Abdominal:     General: Bowel sounds  are normal.     Palpations: Abdomen is soft.     Tenderness: There is no abdominal tenderness. There is no guarding or rebound.     Hernia: No hernia is present.  Musculoskeletal:        General: No swelling.     Cervical back: Full passive range of motion without pain, normal range of motion and neck supple. No spinous process tenderness or muscular tenderness. Normal range of motion.     Right lower leg: No edema.     Left lower leg: No edema.  Skin:    General: Skin is warm and dry.     Capillary Refill: Capillary refill takes less than 2 seconds.     Findings: No ecchymosis, erythema, rash or wound.     Comments: Small raised, scabbed area right side of neck with mild swelling surrounding the region, no erythema or warmth.  No fluctuance.  Neurological:     General: No focal deficit present.     Mental Status: She is alert and oriented to person, place, and time.     GCS: GCS eye subscore is 4. GCS verbal subscore is 5. GCS motor subscore is 6.     Cranial Nerves: Cranial nerves 2-12 are intact.     Sensory: Sensation is intact.     Motor: Motor function is intact.     Coordination: Coordination is intact.  Psychiatric:        Attention and Perception: Attention normal.        Mood and Affect: Mood normal.        Speech: Speech normal.        Behavior: Behavior normal.     ED Results / Procedures / Treatments   Labs (all labs ordered are listed, but only abnormal results are displayed) Labs Reviewed - No data to display  EKG None  Radiology No results found.  Procedures Procedures    Medications Ordered in ED Medications  dexamethasone (DECADRON) injection 10 mg (has no administration in time range)    ED Course/ Medical Decision Making/ A&P                                 Medical Decision Making  Patient with nonspecific rash, local reaction from insect bite/sting versus soft tissue infection.        Final Clinical Impression(s) / ED  Diagnoses Final diagnoses:  Insect bite of other part of neck, initial encounter    Rx / DC Orders ED Discharge Orders  Ordered    doxycycline (VIBRAMYCIN) 100 MG capsule  2 times daily        07/27/23 0326              Gilda Crease, MD 07/27/23 [phone]

## 2023-07-27 NOTE — ED Triage Notes (Signed)
Pt states she scratched the right side of her neck on Sunday now the area is red and swollen. Took benadryl at home with no relief.

## 2023-07-28 ENCOUNTER — Telehealth: Payer: Self-pay

## 2023-07-28 NOTE — Transitions of Care (Post Inpatient/ED Visit) (Signed)
   07/28/2023  Name: Victoria Moore MRN: [id number] DOB: 16-Feb-1976  Today's TOC FU Call Status: Today's TOC FU Call Status:: Successful TOC FU Call Completed Unsuccessful Call (1st Attempt) Date: 07/28/23 Unsuccessful Call (2nd Attempt) Date: 07/28/23 [facility] FU Call Complete Date: 07/28/23  Attempted to reach the patient regarding the most recent Inpatient/ED visit.  Follow Up Plan: No further outreach attempts will be made at this time. We have been unable to contact the patient. Pt has appt 08-02-23  Signature   Woodfin Ganja LPN [facility] Nurse Health Advisor Direct Dial [phone]

## 2023-07-28 NOTE — Transitions of Care (Post Inpatient/ED Visit) (Signed)
   07/28/2023  Name: Victoria Moore MRN: [id number] DOB: 01-07-1976  Today's TOC FU Call Status: Today's TOC FU Call Status:: Unsuccessful Call (1st Attempt) Unsuccessful Call (1st Attempt) Date: 07/28/23  Attempted to reach the patient regarding the most recent Inpatient/ED visit.  Follow Up Plan: Additional outreach attempts will be made to reach the patient to complete the Transitions of Care (Post Inpatient/ED visit) call.   Signature   Woodfin Ganja LPN [facility] Nurse Health Advisor Direct Dial [phone]

## 2023-07-31 ENCOUNTER — Other Ambulatory Visit: Payer: Self-pay | Admitting: Internal Medicine

## 2023-07-31 DIAGNOSIS — F4010 Social phobia, unspecified: Secondary | ICD-10-CM

## 2023-08-02 ENCOUNTER — Ambulatory Visit: Payer: 59 | Admitting: Internal Medicine

## 2023-08-03 ENCOUNTER — Other Ambulatory Visit: Payer: Self-pay | Admitting: Physician Assistant

## 2023-08-05 ENCOUNTER — Encounter: Payer: Self-pay | Admitting: Internal Medicine

## 2023-08-10 ENCOUNTER — Ambulatory Visit: Payer: 59 | Admitting: Orthopedic Surgery

## 2023-08-16 ENCOUNTER — Ambulatory Visit (HOSPITAL_COMMUNITY): Payer: 59 | Admitting: Psychiatry

## 2023-08-16 ENCOUNTER — Encounter: Payer: Self-pay | Admitting: Family Medicine

## 2023-08-16 NOTE — Progress Notes (Deleted)
New Patient Office Visit  Subjective    Patient ID: Victoria Moore, female    DOB: Sep 08, 1976  Age: 47 y.o. MRN: [id number]  CC: No chief complaint on file.   HPI Victoria Moore presents to establish care with new provider.   Patients previous primary care provider was [facility] with Dr. Albertha Ghee. Last visit was 06/28/2023.   Specialist: Pysch  Migraine: Chronic.   HTN: Chronic.   Asthmatic Bronchitis:   GERD:   Chronic low back pain:   Sarcoidosis:   Shift work sleep disorder:  Social anxiety disorder:  ADHD:   History of epilepsy:     Outpatient Encounter Medications as of 08/17/2023  Medication Sig   albuterol (PROVENTIL) (2.5 MG/3ML) 0.083% nebulizer solution Inhale 3 mLs (2.5 mg total) via nebulizer every 6 (six) hours as needed for wheezing or shortness of breath.   albuterol (VENTOLIN HFA) 108 (90 Base) MCG/ACT inhaler Inhale 1-2 puffs into the lungs every 4 (four) hours as needed for wheezing or shortness of breath.   ALPRAZolam (XANAX) 0.25 MG tablet TAKE 1 TABLET (0.25 MG TOTAL) BY MOUTH 2 (TWO) TIMES DAILY AS NEEDED FOR UP TO 30 DOSES FOR ANXIETY.   amphetamine-dextroamphetamine (ADDERALL XR) 20 MG 24 hr capsule Take 1 capsule (20 mg total) by mouth as needed.   chlorthalidone (HYGROTON) 25 MG tablet TAKE 1 TABLET BY MOUTH EVERY DAY   doxycycline (VIBRAMYCIN) 100 MG capsule Take 1 capsule (100 mg total) by mouth 2 (two) times daily.   DULoxetine (CYMBALTA) 30 MG capsule TAKE 1 CAPSULE BY MOUTH EVERY DAY   Emollient (DERMAPHOR) OINT Apply 1 application  topically as needed.   fluticasone (FLONASE) 50 MCG/ACT nasal spray Place 1 spray into both nostrils 2 (two) times daily.   Galcanezumab-gnlm (EMGALITY) 120 MG/ML SOAJ Inject 120 mg into the skin every 30 (thirty) days.   prochlorperazine (COMPAZINE) 10 MG tablet Take 1 tablet (10 mg total) by mouth every 6 (six) hours as needed for nausea or vomiting (or headache).   QUEtiapine  (SEROQUEL) 100 MG tablet Take 1 tablet (100 mg total) by mouth at bedtime.   SUMAtriptan (IMITREX) 20 MG/ACT nasal spray Place 1 spray (20 mg total) into the nose every 2 (two) hours as needed for migraine or headache. May repeat in 2 hours if headache persists or recurs. Maximum of 40 mg (2 sprays) in 24 hours.   tiZANidine (ZANAFLEX) 2 MG tablet Take 1 tablet (2 mg total) by mouth every 6 (six) hours as needed for muscle spasms.   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 Fibromyalgia, doubt sarcoid    Asthma    Blood transfusion without reported diagnosis    Chest wall pain 05/26/2018   Chronic kidney disease    Chronic low back pain with sciatica 09/15/2018   Common migraine with intractable migraine 10/07/2016   Cough variant asthma vs UACS  10/23/2016   - Spirometry 04/15/2016  Very truncated exp loop effort dep portion only  - Allergy profile 10/22/2016 >  Eos 0.2/  IgE  52 RAST POS grass/trees/ragweed  10/22/2016  After extensive coaching HFA effectiveness =    75% try duelra 100 2bid > improved   Daytime sleepiness 05/26/2018   Decreased pedal pulses 10/04/2018   Depression    Depression, major, single episode, moderate (HCC) 05/26/2018   Diabetes (HCC)    Dyspnea 04/15/2016   04/15/2016  Walked RA x 3 laps @ 185 ft each stopped due to  End of study, nl pace, no sob or desat    - Spirometry 04/15/2016  Very truncated exp loop effort dep portion only  - 10/22/2016  Walked RA x 3 laps @ 185 ft each stopped due to  End of study, slow pace, min sob/ no desat - full pfts rec 10/22/2016 >>>       Edema    Essential hypertension, benign 11/15/2016   Fibroids 03/11/2016   Frequency of  urination 09/11/2018   GERD (gastroesophageal reflux disease)    Herniated lumbar intervertebral disc    Hiatal hernia    Hilar adenopathy    Hypertension    Hypokalemia 06/28/2018   Impaired fasting blood sugar 09/11/2018   Iron deficiency    Leg pain 12/07/2018   Low libido 11/27/2018   Migraine    Mild sleep apnea 08/03/2018   HST 06/20/18  AHI  8.1 / snoring with 02 nadir 80% >  08/03/2018 rec sleep medicine consultation    Morbid (severe) obesity due to excess calories (HCC) 10/23/2016   Personal history of sarcoidosis 05/26/2018   Prolonged capillary refill time    Right leg swelling 08/18/2018   Sarcoidosis    personal history of   Seizures (HCC)    history of    Sleep apnea    Spondylosis    Vitamin D deficiency     Past Surgical History:  Procedure Laterality Date   ABDOMINAL HYSTERECTOMY     ESOPHAGEAL MANOMETRY N/A 09/05/2019   Procedure: ESOPHAGEAL MANOMETRY (EM);  Surgeon: Shellia Cleverly, DO;  Location: [facility];  Service: Gastroenterology;  Laterality: N/A;   LAPAROSCOPIC OVARIAN CYSTECTOMY Left 03/11/2016   Procedure: LAPAROSCOPIC OVARIAN CYSTECTOMY;  Surgeon: Hal Morales, MD;  Location: [facility];  Service: Gynecology;  Laterality: Left;   LAPAROSCOPIC VAGINAL HYSTERECTOMY WITH SALPINGECTOMY Bilateral 03/11/2016   Procedure: LAPAROSCOPIC ASSISTED VAGINAL HYSTERECTOMY WITH SALPINGECTOMY;  Surgeon: Hal Morales, MD;  Location: [facility];  Service: Gynecology;  Laterality: Bilateral;   PH IMPEDANCE STUDY  09/05/2019   Procedure: PH IMPEDANCE STUDY;  Surgeon: Shellia Cleverly, DO;  Location: [facility];  Service: Gastroenterology;;   TUBAL LIGATION     UPPER GASTROINTESTINAL ENDOSCOPY  10/11/2019   06/2019    Family History  Adopted: Yes  Problem Relation Age of Onset   Other Son        Growing pains   Asthma Mother    Heart Problems Mother    Migraines Mother    COPD Mother    Heart attack Mother    Diabetes Father    Peptic Ulcer Father    Heart  Problems Father    COPD Father    Heart attack Father    Colon cancer Neg Hx    Colon polyps Neg Hx    Esophageal cancer Neg Hx    Rectal cancer Neg Hx    Stomach cancer Neg Hx     Social History   Socioeconomic History   Marital status: Significant Other    Spouse name: Not on file   Number of children: 1   Years of education: 11  Highest education level: Some college, no degree  Occupational History   Occupation: Lawyer  Tobacco Use   Smoking status: Former    Current packs/day: 0.00    Average packs/day: 0.3 packs/day for 17.0 years (4.3 ttl pk-yrs)    Types: Cigarettes    Start date: 03/02/1996    Quit date: 03/02/2013    Years since quitting: 10.4   Smokeless tobacco: Never  Vaping Use   Vaping status: Never Used  Substance and Sexual Activity   Alcohol use: Yes    Comment: occ   Drug use: Not Currently    Types: Marijuana    Comment: former - 6 yrs ago   Sexual activity: Yes    Birth control/protection: Surgical  Other Topics Concern   Not on file  Social History Narrative   She lives w/ her fiance'. She has one son.    Highest level of education:  12th grade, did not graduate. Currently back in school.   Right-handed   Caffeine: 1 cup of coffee some mornings      Social History      Diet? No       Do you drink/eat things with caffeine? yes      Marital status?           Single                          What year were you married?       Do you live in a house, apartment, assisted living, condo, trailer, etc.? house      Is it one or more stories?     How many persons live in your home?  2      Do you have any pets in your home? (please list)   No       Highest level of education completed? Some college       Current or past profession:       Do you exercise?       Yes                                Type & how often? Daily       Advanced Directives      Do you have a living will? No       Do you have a DNR form?                                   If not, do you want to discuss one? No       Do you have signed POA/HPOA for forms?  No       Functional Status      Do you have difficulty bathing or dressing yourself? No       Do you have difficulty preparing food or eating? No       Do you have difficulty managing your medications? No       Do you have difficulty managing your finances? No       Do you have difficulty affording your medications? No       Social Determinants of Health   Financial Resource Strain: Medium Risk (04/22/2023)   Overall Financial Resource Strain (CARDIA)    Difficulty of Paying Living Expenses: Somewhat hard  Food Insecurity: Food Insecurity Present (  04/22/2023)   Hunger Vital Sign    Worried About Running Out of Food in the Last Year: Sometimes true    Ran Out of Food in the Last Year: Sometimes true  Transportation Needs: Unmet Transportation Needs (04/22/2023)   PRAPARE - Transportation    Lack of Transportation (Medical): Yes    Lack of Transportation (Non-Medical): Yes  Physical Activity: Unknown (04/22/2023)   Exercise Vital Sign    Days of Exercise per Week: 0 days    Minutes of Exercise per Session: Not on file  Stress: Stress Concern Present (04/22/2023)   [employer] of Occupational Health - Occupational Stress Questionnaire    Feeling of Stress : Very much  Social Connections: Moderately Isolated (04/22/2023)   Social Connection and Isolation Panel [NHANES]    Frequency of Communication with Friends and Family: More than three times a week    Frequency of Social Gatherings with Friends and Family: More than three times a week    Attends Religious Services: Never    Database administrator or Organizations: No    Attends Banker Meetings: Not on file    Marital Status: Married  Intimate Partner Violence: Not on file    ROS See HPI above    Objective    LMP 02/19/2016   Physical Exam    Assessment & Plan:  There are no diagnoses linked to this  encounter. 1.Review health maintenance: -Covid vaccine:  -Influenza vaccine:  -Pap smear:  No follow-ups on file.   Zandra Abts, NP

## 2023-08-17 ENCOUNTER — Ambulatory Visit: Payer: 59 | Admitting: Family Medicine

## 2023-08-17 ENCOUNTER — Encounter: Payer: 59 | Admitting: Family Medicine

## 2023-08-18 NOTE — Progress Notes (Signed)
Precharted on patient and she canceled appointment.

## 2023-08-22 ENCOUNTER — Other Ambulatory Visit: Payer: Self-pay | Admitting: Physician Assistant

## 2023-08-24 ENCOUNTER — Emergency Department (HOSPITAL_COMMUNITY)
Admission: EM | Admit: 2023-08-24 | Discharge: 2023-08-24 | Disposition: A | Discharge status: Home / Self Care | Payer: 59 | Attending: Emergency Medicine | Admitting: Emergency Medicine

## 2023-08-24 DIAGNOSIS — R5383 Other fatigue: Secondary | ICD-10-CM | POA: Diagnosis present

## 2023-08-24 DIAGNOSIS — Z5321 Procedure and treatment not carried out due to patient leaving prior to being seen by health care provider: Secondary | ICD-10-CM | POA: Diagnosis not present

## 2023-08-24 DIAGNOSIS — M791 Myalgia, unspecified site: Secondary | ICD-10-CM | POA: Insufficient documentation

## 2023-08-25 ENCOUNTER — Emergency Department (HOSPITAL_COMMUNITY)
Admission: EM | Admit: 2023-08-25 | Discharge: 2023-08-25 | Disposition: A | Discharge status: Home / Self Care | Payer: 59 | Attending: Emergency Medicine | Admitting: Emergency Medicine

## 2023-08-25 ENCOUNTER — Other Ambulatory Visit: Payer: Self-pay

## 2023-08-25 ENCOUNTER — Emergency Department (HOSPITAL_COMMUNITY): Payer: 59

## 2023-08-25 DIAGNOSIS — E119 Type 2 diabetes mellitus without complications: Secondary | ICD-10-CM | POA: Diagnosis not present

## 2023-08-25 DIAGNOSIS — D72819 Decreased white blood cell count, unspecified: Secondary | ICD-10-CM | POA: Insufficient documentation

## 2023-08-25 DIAGNOSIS — I10 Essential (primary) hypertension: Secondary | ICD-10-CM | POA: Diagnosis not present

## 2023-08-25 DIAGNOSIS — R079 Chest pain, unspecified: Secondary | ICD-10-CM | POA: Diagnosis present

## 2023-08-25 DIAGNOSIS — Z79899 Other long term (current) drug therapy: Secondary | ICD-10-CM | POA: Diagnosis not present

## 2023-08-25 DIAGNOSIS — Z1152 Encounter for screening for COVID-19: Secondary | ICD-10-CM | POA: Insufficient documentation

## 2023-08-25 DIAGNOSIS — M791 Myalgia, unspecified site: Secondary | ICD-10-CM | POA: Insufficient documentation

## 2023-08-25 LAB — CBC
HCT: 36.2 % (ref 36.0–46.0)
Hemoglobin: 11.8 g/dL — ABNORMAL LOW (ref 12.0–15.0)
MCH: 28.4 pg (ref 26.0–34.0)
MCHC: 32.6 g/dL (ref 30.0–36.0)
MCV: 87 fL (ref 80.0–100.0)
Platelets: 182 10*3/uL (ref 150–400)
RBC: 4.16 MIL/uL (ref 3.87–5.11)
RDW: 12.9 % (ref 11.5–15.5)
WBC: 3.4 10*3/uL — ABNORMAL LOW (ref 4.0–10.5)
nRBC: 0 % (ref 0.0–0.2)

## 2023-08-25 LAB — BASIC METABOLIC PANEL
Anion gap: 8 (ref 5–15)
BUN: 9 mg/dL (ref 6–20)
CO2: 25 mmol/L (ref 22–32)
Calcium: 8.3 mg/dL — ABNORMAL LOW (ref 8.9–10.3)
Chloride: 102 mmol/L (ref 98–111)
Creatinine, Ser: 0.98 mg/dL (ref 0.44–1.00)
GFR, Estimated: >60 mL/min (ref >60)
Glucose, Bld: 97 mg/dL (ref 70–99)
Potassium: 3.7 mmol/L (ref 3.5–5.1)
Sodium: 135 mmol/L (ref 135–145)

## 2023-08-25 LAB — RESP PANEL BY RT-PCR (RSV, FLU A&B, COVID)  RVPGX2
Influenza A by PCR: NEGATIVE
Influenza B by PCR: NEGATIVE
Resp Syncytial Virus by PCR: NEGATIVE
SARS Coronavirus 2 by RT PCR: NEGATIVE

## 2023-08-25 LAB — TROPONIN I (HIGH SENSITIVITY)
Troponin I (High Sensitivity): <2 ng/L (ref <18)
Troponin I (High Sensitivity): <2 ng/L (ref <18)

## 2023-08-25 MED ORDER — LACTATED RINGERS IV BOLUS
500.0000 mL | Freq: Once | INTRAVENOUS | Status: AC
  Administered 2023-08-25: 500 mL via INTRAVENOUS

## 2023-08-25 MED ORDER — ACETAMINOPHEN 500 MG PO TABS
1000.0000 mg | ORAL_TABLET | Freq: Once | ORAL | Status: AC
  Administered 2023-08-25: 1000 mg via ORAL
  Filled 2023-08-25: qty 2

## 2023-08-25 MED ORDER — DIPHENHYDRAMINE HCL 50 MG/ML IJ SOLN
25.0000 mg | Freq: Once | INTRAMUSCULAR | Status: AC
  Administered 2023-08-25: 25 mg via INTRAVENOUS
  Filled 2023-08-25: qty 1

## 2023-08-25 MED ORDER — METOCLOPRAMIDE HCL 5 MG/ML IJ SOLN
10.0000 mg | Freq: Once | INTRAMUSCULAR | Status: AC
  Administered 2023-08-25: 10 mg via INTRAVENOUS
  Filled 2023-08-25: qty 2

## 2023-08-25 NOTE — ED Triage Notes (Signed)
Pt c/o left sided chest pain and mid center chest apian since last night. Pt c/o dizziness and nausea with pain.

## 2023-08-25 NOTE — Discharge Instructions (Addendum)
Pleasure taking care of you today.  He was seen for body aches and headache and chest pain.  Your EKG, chest x-ray and troponin are all very reassuring, no sign of heart attack today, you have no signs or symptoms to suggest a blood clot, your oxygen is normal, your symptoms may be related to a virus, drink plenty of fluids and rest, follow-up closely with your primary care doctor.  If you have any new or worsening symptoms please come back to the ER right away.

## 2023-08-25 NOTE — ED Provider Notes (Signed)
[place] EMERGENCY DEPARTMENT AT [facility] Provider Note   CSN: [id number] Arrival date & time: 08/25/23  9562     History  Chief Complaint  Patient presents with   Chest Pain    Victoria Moore is a 47 y.o. female.  Ports history of anemia, diabetes, seizures, migraines, hypertension, bronchitis, sarcoidosis.  She presents the ER today complaining of pain all over her whole body.  This started 2 days ago, states her skin hurts to touch and she is having pain throughout her back, legs and arms.  Last night she started having some chest pain as well and nausea and dizziness.  Denies vertigo denies syncope, no shortness of breath.  She states her pain gets worse with movement.  Denies alleviating factors.  Denies URI symptoms or cough.  She has nausea but no vomiting.  She has been extremely thirsty and very tired as well.   Chest Pain Associated symptoms: nausea   Associated symptoms: no abdominal pain and no fever        Home Medications Prior to Admission medications   Medication Sig Start Date End Date Taking? Authorizing Provider  albuterol (PROVENTIL) (2.5 MG/3ML) 0.083% nebulizer solution Inhale 3 mLs (2.5 mg total) via nebulizer every 6 (six) hours as needed for wheezing or shortness of breath. 01/24/23   Billie Lade, MD  albuterol (VENTOLIN HFA) 108 (90 Base) MCG/ACT inhaler Inhale 1-2 puffs into the lungs every 4 (four) hours as needed for wheezing or shortness of breath. 01/23/21   Parrett, Virgel Bouquet, NP  ALPRAZolam (XANAX) 0.25 MG tablet TAKE 1 TABLET (0.25 MG TOTAL) BY MOUTH 2 (TWO) TIMES DAILY AS NEEDED FOR UP TO 30 DOSES FOR ANXIETY. 08/01/23   Billie Lade, MD  amphetamine-dextroamphetamine (ADDERALL XR) 20 MG 24 hr capsule Take 1 capsule (20 mg total) by mouth as needed. 06/28/23   Gardenia Phlegm, MD  chlorthalidone (HYGROTON) 25 MG tablet Take 1 tablet (25 mg total) by mouth daily. Pt needs to schedule appt. Pt can get a 90 day supply after office visit -  3rd and final attempt 08/22/23   Dyann Kief, PA-C  doxycycline (VIBRAMYCIN) 100 MG capsule Take 1 capsule (100 mg total) by mouth 2 (two) times daily. 07/27/23   Gilda Crease, MD  DULoxetine (CYMBALTA) 30 MG capsule TAKE 1 CAPSULE BY MOUTH EVERY DAY 07/26/23   Billie Lade, MD  Emollient [facility]) OINT Apply 1 application  topically as needed. 09/22/22   Billie Lade, MD  fluticasone (FLONASE) 50 MCG/ACT nasal spray Place 1 spray into both nostrils 2 (two) times daily. 03/05/23   Particia Nearing, PA-C  Galcanezumab-gnlm (EMGALITY) 120 MG/ML SOAJ Inject 120 mg into the skin every 30 (thirty) days. 11/16/22   Billie Lade, MD  prochlorperazine (COMPAZINE) 10 MG tablet Take 1 tablet (10 mg total) by mouth every 6 (six) hours as needed for nausea or vomiting (or headache). 11/08/22   Chesley Noon, MD  QUEtiapine (SEROQUEL) 100 MG tablet Take 1 tablet (100 mg total) by mouth at bedtime. 06/28/23   Gardenia Phlegm, MD  SUMAtriptan (IMITREX) 20 MG/ACT nasal spray Place 1 spray (20 mg total) into the nose every 2 (two) hours as needed for migraine or headache. May repeat in 2 hours if headache persists or recurs. Maximum of 40 mg (2 sprays) in 24 hours. 02/23/23   Billie Lade, MD  tiZANidine (ZANAFLEX) 2 MG tablet Take 1 tablet (2 mg total) by mouth  every 6 (six) hours as needed for muscle spasms. 06/28/23   Gardenia Phlegm, MD      Allergies    Aspirin and Nsaids    Review of Systems   Review of Systems  Constitutional:  Negative for fever.  Cardiovascular:  Positive for chest pain. Negative for leg swelling.  Gastrointestinal:  Positive for nausea. Negative for abdominal pain.  Genitourinary:  Negative for dysuria, flank pain and hematuria.    Physical Exam Updated Vital Signs BP 120/85   Pulse 70   Temp 98.6 F (37 C) (Oral)   Resp 15   Ht 5\' 11"  (1.803 m)   Wt 106.6 kg   LMP 02/19/2016   SpO2 96%   BMI 32.78 kg/m  Physical Exam Vitals and nursing note  reviewed.  Constitutional:      General: She is not in acute distress.    Appearance: She is well-developed.  HENT:     Head: Normocephalic and atraumatic.  Eyes:     Conjunctiva/sclera: Conjunctivae normal.  Cardiovascular:     Rate and Rhythm: Normal rate and regular rhythm.     Heart sounds: Normal heart sounds. No murmur heard. Pulmonary:     Effort: Pulmonary effort is normal. No respiratory distress.     Breath sounds: Normal breath sounds.  Chest:     Chest wall: Tenderness present. No edema.  Abdominal:     Palpations: Abdomen is soft.     Tenderness: There is no abdominal tenderness.  Musculoskeletal:        General: No swelling. Normal range of motion.     Cervical back: Neck supple.     Right lower leg: No tenderness. No edema.     Left lower leg: No tenderness. No edema.  Skin:    General: Skin is warm and dry.     Capillary Refill: Capillary refill takes less than 2 seconds.     Findings: No rash.  Neurological:     General: No focal deficit present.     Mental Status: She is alert and oriented to person, place, and time.  Psychiatric:        Mood and Affect: Mood normal.        Behavior: Behavior normal.     ED Results / Procedures / Treatments   Labs (all labs ordered are listed, but only abnormal results are displayed) Labs Reviewed  BASIC METABOLIC PANEL - Abnormal; Notable for the following components:      Result Value   Calcium 8.3 (*)    All other components within normal limits  CBC - Abnormal; Notable for the following components:   WBC 3.4 (*)    Hemoglobin 11.8 (*)    All other components within normal limits  RESP PANEL BY RT-PCR (RSV, FLU A&B, COVID)  [id number]  TROPONIN I (HIGH SENSITIVITY)  TROPONIN I (HIGH SENSITIVITY)    EKG EKG Interpretation Date/Time:  Thursday August 25 2023 09:33:52 EDT Ventricular Rate:  68 PR Interval:  147 QRS Duration:  91 QT Interval:  405 QTC Calculation: 431 R Axis:   58  Text  Interpretation: Sinus rhythm Confirmed by Cathren Laine ([id number]) on 08/25/2023 10:12:00 AM  Radiology DG Chest 2 View  Result Date: 08/25/2023 CLINICAL DATA:  Acute onset left-sided and mid chest pain associated with dizziness and nausea EXAM: CHEST - 2 VIEW COMPARISON:  Chest radiograph dated 01/19/2023 FINDINGS: Normal lung volumes. No focal consolidations. No pleural effusion or pneumothorax. The heart size and mediastinal contours are  within normal limits. No acute osseous abnormality. IMPRESSION: No active cardiopulmonary disease. Electronically Signed   By: Agustin Cree M.D.   On: 08/25/2023 10:02    Procedures Procedures    Medications Ordered in ED Medications  acetaminophen (TYLENOL) tablet 1,000 mg (1,000 mg Oral Given 08/25/23 1156)  lactated ringers bolus 500 mL (0 mLs Intravenous Stopped 08/25/23 1256)  metoCLOPramide (REGLAN) injection 10 mg (10 mg Intravenous Given 08/25/23 1200)  diphenhydrAMINE (BENADRYL) injection 25 mg (25 mg Intravenous Given 08/25/23 1201)    ED Course/ Medical Decision Making/ A&P             HEART Score: 2                    Medical Decision Making This patient presents to the ED for concern of multiple complaints including pain over her whole body, headache, increased thirst, nausea, chest pain, this involves an extensive number of treatment options, and is a complaint that carries with it a high risk of complications and morbidity.  The differential diagnosis includes COVID-19, viral syndrome, ACS, pneumonia, pericarditis, dehydration, sarcoidosis flare, other   Co morbidities that complicate the patient evaluation :   Process, hypertension   Additional history obtained:  Additional history obtained from EMR External records from outside source obtained and reviewed including notes, NM stress test from 2021   Lab Tests:  I Ordered, and personally interpreted labs.  The pertinent results include: COVID is less than 2 with a delta of 0, COVID flu  RSV is negative, BMP is normal, CBC shows mild leukopenia otherwise reassuring.     Imaging Studies ordered:  I ordered imaging studies including chest x-ray which shows pulmonary edema or infiltrate, no cardiomegaly I independently visualized and interpreted imaging within scope of identifying emergent findings  I agree with the radiologist interpretation   Cardiac Monitoring: / EKG:  The patient was maintained on a cardiac monitor.  I personally viewed and interpreted the cardiac monitored which showed an underlying rhythm of: Sinus rhythm with no ST-T wave changes    Problem List / ED Course / Critical interventions / Medication management  Here with diffuse myalgia by several days, developed chest pain yesterday as well, but has been continuous, it is not exertional, no sweating.  She has had vague nausea but does not seem to subside slightly with the chest pain.  No vomiting.  She is well-appearing with reassuring vitals, labs and EKG as well as chest x-ray.  She is PERC negative, no need for further workup for PE study.  She is feeling much better after fluids migraine cocktail.  States she has felt like this in the past, got better with rest.  No flank pain, no abdominal pain no urinary symptoms.  Feels ready to go home was given follow-up closely with primary care doctor.  Also has follow-up coming up with her cardiologist.  She states she is having to switch cardiologist due to her insurance. I ordered medication including Tylenol, Reglan, Benadryl, IV fluids for headache, nausea, body aches Reevaluation of the patient after these medicines showed that the patient improved I have reviewed the patients home medicines and have made adjustments as needed       Amount and/or Complexity of Data Reviewed Labs: ordered. Radiology: ordered.  Risk OTC drugs. Prescription drug management.           Final Clinical Impression(s) / ED Diagnoses Final diagnoses:  Myalgia   Nonspecific chest pain  Rx / DC Orders ED Discharge Orders     None         Josem Kaufmann 08/25/23 1400    Cathren Laine, MD 08/31/23 [phone]

## 2023-09-05 ENCOUNTER — Other Ambulatory Visit: Payer: Self-pay | Admitting: Physician Assistant

## 2023-09-07 ENCOUNTER — Other Ambulatory Visit: Payer: Self-pay | Admitting: Internal Medicine

## 2023-09-07 DIAGNOSIS — F4010 Social phobia, unspecified: Secondary | ICD-10-CM

## 2023-09-07 DIAGNOSIS — E559 Vitamin D deficiency, unspecified: Secondary | ICD-10-CM

## 2023-09-20 ENCOUNTER — Other Ambulatory Visit: Payer: Self-pay | Admitting: Internal Medicine

## 2023-09-23 ENCOUNTER — Ambulatory Visit: Payer: Self-pay | Admitting: Physician Assistant

## 2023-09-27 ENCOUNTER — Ambulatory Visit: Payer: Self-pay | Admitting: Physician Assistant

## 2023-09-28 ENCOUNTER — Ambulatory Visit: Payer: 59 | Admitting: Family Medicine

## 2023-10-01 ENCOUNTER — Other Ambulatory Visit: Payer: Self-pay | Admitting: Internal Medicine

## 2023-10-01 DIAGNOSIS — F331 Major depressive disorder, recurrent, moderate: Secondary | ICD-10-CM

## 2023-10-01 DIAGNOSIS — G629 Polyneuropathy, unspecified: Secondary | ICD-10-CM

## 2023-11-04 ENCOUNTER — Ambulatory Visit: Payer: Self-pay | Admitting: Physician Assistant

## 2023-11-15 ENCOUNTER — Inpatient Hospital Stay
Admission: RE | Admit: 2023-11-15 | Discharge: 2023-11-15 | Disposition: A | Discharge status: Home / Self Care | Payer: Self-pay | Source: Ambulatory Visit | Attending: Internal Medicine | Admitting: Internal Medicine

## 2023-11-15 ENCOUNTER — Ambulatory Visit (INDEPENDENT_AMBULATORY_CARE_PROVIDER_SITE_OTHER): Payer: No Typology Code available for payment source

## 2023-11-15 DIAGNOSIS — J4521 Mild intermittent asthma with (acute) exacerbation: Secondary | ICD-10-CM | POA: Diagnosis not present

## 2023-11-15 DIAGNOSIS — J069 Acute upper respiratory infection, unspecified: Secondary | ICD-10-CM | POA: Diagnosis not present

## 2023-11-15 NOTE — ED Triage Notes (Signed)
I slipped coming down my steps this morning . Pain when I bend my wrist. - Entered by patient  "It is my right wrist and now it hurts with pain/swelling, decreased ROM". No head injury. No lacerations.

## 2023-11-16 ENCOUNTER — Ambulatory Visit: Payer: Self-pay

## 2023-11-23 ENCOUNTER — Ambulatory Visit: Payer: Self-pay | Admitting: Family

## 2023-11-29 NOTE — Progress Notes (Unsigned)
Cardiology Office Note:  .   Date:  11/29/2023  ID:  Victoria Moore, DOB 1976-07-03, MRN [id number] PCP: Billie Lade, MD  [facility] Providers Cardiologist:  Kristeen Miss, MD { Click to update primary MD,subspecialty MD or APP then REFRESH:1}   History of Present Illness: .   Victoria Moore is a 47 y.o. female  with history of hypertension, DM,History of chest pain, sarcoidosis, GERD, gastric bypass.  Patient saw Dr. Elease Hashimoto 10/09/2019 with atypical chest pain felt to be musculoskeletal as well as palpitations that could be due to Adderall and albuterol.  He ordered a monitor which showed normal sinus rhythm with frequent loss of lead and available no is seen.   2D echo 02/15/2020 normal LVEF 65 to 70%, NST 02/28/2020 intermediate risk because of mild decreased LV function EF 25% no ischemia. Has been seen at [facility] for chest pain as well.  I saw the patietn 04/2022 and BP's up since meds stopped after gastric bypass. I restarted hygroton.    ROS: ***  Studies Reviewed: Marland Kitchen         Prior CV Studies: {Select studies to display:26339}  NST 02/28/2020 Blood pressure demonstrated a blunted response to exercise. There was no ST segment deviation noted during stress. This is an intermediate risk study. The left ventricular ejection fraction is mildly decreased (45-54%). Nuclear stress EF: 45%. Echo 01/2020 IMPRESSIONS     1. Left ventricular ejection fraction, by estimation, is 65 to 70%. The  left ventricle has normal function. The left ventricle has no regional  wall motion abnormalities. Left ventricular diastolic parameters were  normal.   2. Right ventricular systolic function is normal. The right ventricular  size is normal. There is normal pulmonary artery systolic pressure.   3. The mitral valve is normal in structure and function. No evidence of  mitral valve regurgitation.   4. The aortic valve is normal in structure and function. Aortic valve  regurgitation is not  visualized.   FINDINGS   Left Ventricle: Left ventricular ejection fraction, by estimation, is 65  to 70%. The left ventricle has normal function. The left ventricle has no  regional wall motion abnormalities. The left ventricular internal cavity  size was normal in size. There is   no left ventricular hypertrophy. Left ventricular diastolic parameters  were normal.   Right Ventricle: The right ventricular size is normal. No increase in  right ventricular wall thickness. Right ventricular systolic function is  normal. There is normal pulmonary artery systolic pressure. The tricuspid  regurgitant velocity is 1.93 m/s, and   with an assumed right atrial pressure of 10 mmHg, the estimated right  ventricular systolic pressure is 24.9 mmHg.   Left Atrium: Left atrial size was normal in size.   Right Atrium: Right atrial size was normal in size.   Pericardium: There is no evidence of pericardial effusion.   Mitral Valve: The mitral valve is normal in structure and function. No  evidence of mitral valve regurgitation.   Tricuspid Valve: The tricuspid valve is normal in structure. Tricuspid  valve regurgitation is mild.   Aortic Valve: The aortic valve is normal in structure and function. Aortic  valve regurgitation is not visualized. Aortic valve mean gradient measures  4.5 mmHg. Aortic valve peak gradient measures 8.6 mmHg. Aortic valve area,  by VTI measures 2.23 cm.   Pulmonic Valve: The pulmonic valve was normal in structure. Pulmonic valve  regurgitation is trivial.   Aorta: The aortic root was not well  visualized and the aortic root is  normal in size and structure.   IAS/Shunts: No atrial level shunt detected by color flow Doppler.        Risk Assessment/Calculations:   {Does this patient have ATRIAL FIBRILLATION?[phone]} No BP recorded.  {Refresh Note OR Click here to enter BP  :1}***       Physical Exam:   VS:  LMP 02/19/2016    Wt Readings from Last 3  Encounters:  11/15/23 235 lb 0.2 oz (106.6 kg)  08/25/23 235 lb (106.6 kg)  07/27/23 230 lb (104.3 kg)    GEN: Well nourished, well developed in no acute distress NECK: No JVD; No carotid bruits CARDIAC: ***RRR, no murmurs, rubs, gallops RESPIRATORY:  Clear to auscultation without rales, wheezing or rhonchi  ABDOMEN: Soft, non-tender, non-distended EXTREMITIES:  No edema; No deformity   ASSESSMENT AND PLAN: .   History of chest pain with  intermediate risk NST 2021 because of decreased LV function which was norma LVEF on echo. No recent chest pain   Palpitations-mostly occur in the evening. Will place Zio, check labs including CBC, CMET, TSH, FLP   HTN-BP running high in the evening-used to take hygroton 25 mg daily but stopped after gastric bypass. Will restart and check labs today   DM-hasn't had check in a long time-check A1C     {Are you ordering a CV Procedure (e.g. stress test, cath, DCCV, TEE, etc)?   Press F2        :[id number]}  Dispo: ***  Signed, Jacolyn Reedy, PA-C

## 2023-12-06 ENCOUNTER — Ambulatory Visit
Admission: EM | Admit: 2023-12-06 | Discharge: 2023-12-06 | Disposition: A | Discharge status: Home / Self Care | Payer: No Typology Code available for payment source | Attending: Family Medicine | Admitting: Family Medicine

## 2023-12-06 ENCOUNTER — Other Ambulatory Visit: Payer: Self-pay

## 2023-12-06 ENCOUNTER — Encounter: Payer: Self-pay | Admitting: Emergency Medicine

## 2023-12-06 DIAGNOSIS — J069 Acute upper respiratory infection, unspecified: Secondary | ICD-10-CM | POA: Diagnosis not present

## 2023-12-06 DIAGNOSIS — J4521 Mild intermittent asthma with (acute) exacerbation: Secondary | ICD-10-CM | POA: Diagnosis not present

## 2023-12-06 LAB — POC COVID19/FLU A&B COMBO
Covid Antigen, POC: NEGATIVE
Influenza A Antigen, POC: NEGATIVE
Influenza B Antigen, POC: NEGATIVE

## 2023-12-06 MED ORDER — PREDNISONE 20 MG PO TABS: 40.0000 mg | ORAL_TABLET | Freq: Every day | ORAL | 0 refills | Status: DC

## 2023-12-06 MED ORDER — ALBUTEROL SULFATE HFA 108 (90 BASE) MCG/ACT IN AERS: 2.0000 | INHALATION_SPRAY | RESPIRATORY_TRACT | 0 refills | Status: AC | PRN

## 2023-12-06 MED ORDER — PSEUDOEPH-BROMPHEN-DM 30-2-10 MG/5ML PO SYRP: 5.0000 mL | ORAL_SOLUTION | Freq: Four times a day (QID) | ORAL | 0 refills | Status: DC | PRN

## 2023-12-06 MED ORDER — PREDNISONE 20 MG PO TABS
40.0000 mg | ORAL_TABLET | Freq: Once | ORAL | Status: AC
  Administered 2023-12-06: 40 mg via ORAL

## 2023-12-06 NOTE — ED Triage Notes (Signed)
Pt reports cough, headache, chills, sore throat, nasal drainage x2 days.

## 2023-12-08 ENCOUNTER — Ambulatory Visit: Payer: 59 | Admitting: Physician Assistant

## 2023-12-09 NOTE — ED Provider Notes (Signed)
[facility]    CSN: [id number] Arrival date & time: 12/06/23  1802      History   Chief Complaint Chief Complaint  Patient presents with   Cough    HPI Victoria Moore is a 47 y.o. female.   Presenting today with 2 days stray of cough, headaches, chills, body aches, sore throat, congestion.  Denies chest pain, shortness of breath, abdominal pain, nausea vomiting or diarrhea.  History of asthma on albuterol as needed.  Trying over-the-counter remedies and minimal relief.    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 Fibromyalgia, doubt sarcoid    Asthma    Blood transfusion without reported diagnosis    Chest wall pain 05/26/2018   Chronic kidney disease    Chronic low back pain with sciatica 09/15/2018   Common migraine with intractable migraine 10/07/2016   Cough variant asthma vs UACS  10/23/2016   - Spirometry 04/15/2016  Very truncated exp loop effort dep portion only  - Allergy profile 10/22/2016 >  Eos 0.2/  IgE  52 RAST POS grass/trees/ragweed  10/22/2016  After extensive coaching HFA effectiveness =    75% try duelra 100 2bid > improved   Daytime sleepiness 05/26/2018   Decreased pedal pulses 10/04/2018   Depression    Depression, major, single episode, moderate (HCC) 05/26/2018   Diabetes (HCC)    Dyspnea 04/15/2016   04/15/2016  Walked RA x 3 laps @ 185 ft each stopped due to  End of study, nl pace, no sob or desat    - Spirometry 04/15/2016  Very truncated exp loop effort dep portion only  - 10/22/2016  Walked RA x 3 laps @ 185 ft each stopped due to  End of study, slow pace, min sob/ no desat - full pfts rec 10/22/2016 >>>       Edema    Essential hypertension, benign 11/15/2016   Fibroids 03/11/2016   Frequency of urination 09/11/2018   GERD (gastroesophageal reflux  disease)    Herniated lumbar intervertebral disc    Hiatal hernia    Hilar adenopathy    Hypertension    Hypokalemia 06/28/2018   Impaired fasting blood sugar 09/11/2018   Iron deficiency    Leg pain 12/07/2018   Low libido 11/27/2018   Migraine    Mild sleep apnea 08/03/2018   HST 06/20/18  AHI  8.1 / snoring with 02 nadir 80% >  08/03/2018 rec sleep medicine consultation    Morbid (severe) obesity due to excess calories (HCC) 10/23/2016   Personal history of sarcoidosis 05/26/2018   Prolonged capillary refill time    Right leg swelling 08/18/2018   Sarcoidosis    personal history of   Seizures (HCC)    history of    Sleep apnea    Spondylosis    Vitamin D deficiency     Patient Active Problem List   Diagnosis Date Noted   Peripheral neuropathy 10/12/2022   MDD (major depressive disorder) 10/12/2022   History of epilepsy 09/22/2022   Asthmatic bronchitis 01/23/2021   Social anxiety disorder 10/03/2020   Attention deficit hyperactivity disorder (ADHD), 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   S/P gastric  bypass 05/12/2020   Intractable abdominal pain 04/27/2020   Benign intracranial hypertension 02/14/2020   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   Complex cyst of left ovary 02/01/2019   Sarcoidosis 02/01/2019   Leg pain 12/07/2018   Low libido 11/27/2018   Decreased pedal pulses 10/04/2018   Chronic low back pain with sciatica 09/15/2018   Impaired fasting blood sugar 09/11/2018   Mild sleep apnea 08/03/2018   Hypokalemia 06/28/2018   Medication side effect 06/22/2018   Vitamin D deficiency 05/26/2018   Encounter for health maintenance examination with abnormal findings 05/26/2018   Depression, major, single episode, moderate (HCC) 05/26/2018   Daytime sleepiness 05/26/2018   GERD (gastroesophageal reflux disease) 05/26/2018   Essential hypertension, benign 11/15/2016   Arthritis of ankle joint  11/10/2016   Class 1 obesity with body mass index (BMI) of 32.0 to 32.9 in adult 10/23/2016   Common migraine with intractable migraine 10/07/2016   DOE (dyspnea on exertion) 04/15/2016   Seizures (HCC) 03/11/2016   Anemia 11/12/2014   Iron deficiency 11/12/2014    Past Surgical History:  Procedure Laterality Date   ABDOMINAL HYSTERECTOMY     ESOPHAGEAL MANOMETRY N/A 09/05/2019   Procedure: ESOPHAGEAL MANOMETRY (EM);  Surgeon: Shellia Cleverly, DO;  Location: [facility];  Service: Gastroenterology;  Laterality: N/A;   LAPAROSCOPIC OVARIAN CYSTECTOMY Left 03/11/2016   Procedure: LAPAROSCOPIC OVARIAN CYSTECTOMY;  Surgeon: Hal Morales, MD;  Location: [facility];  Service: Gynecology;  Laterality: Left;   LAPAROSCOPIC VAGINAL HYSTERECTOMY WITH SALPINGECTOMY Bilateral 03/11/2016   Procedure: LAPAROSCOPIC ASSISTED VAGINAL HYSTERECTOMY WITH SALPINGECTOMY;  Surgeon: Hal Morales, MD;  Location: [facility];  Service: Gynecology;  Laterality: Bilateral;   PH IMPEDANCE STUDY  09/05/2019   Procedure: PH IMPEDANCE STUDY;  Surgeon: Shellia Cleverly, DO;  Location: [facility];  Service: Gastroenterology;;   TUBAL LIGATION     UPPER GASTROINTESTINAL ENDOSCOPY  10/11/2019   06/2019    OB History     Gravida  1   Para      Term      Preterm      AB      Living         SAB      IAB      Ectopic      Multiple      Live Births               Home Medications    Prior to Admission medications   Medication Sig Start Date End Date Taking? Authorizing Provider  albuterol (VENTOLIN HFA) 108 (90 Base) MCG/ACT inhaler Inhale 2 puffs into the lungs every 4 (four) hours as needed. 12/06/23  Yes Particia Nearing, PA-C  brompheniramine-pseudoephedrine-DM 30-2-10 MG/5ML syrup Take 5 mLs by mouth 4 (four) times daily as needed. 12/06/23  Yes Particia Nearing, PA-C  predniSONE (DELTASONE) 20 MG tablet Take 2 tablets (40 mg total) by mouth daily with breakfast. 12/06/23  Yes  Particia Nearing, PA-C  albuterol (PROVENTIL) (2.5 MG/3ML) 0.083% nebulizer solution Inhale 3 mLs (2.5 mg total) via nebulizer every 6 (six) hours as needed for wheezing or shortness of breath. 01/24/23   Billie Lade, MD  albuterol (VENTOLIN HFA) 108 (90 Base) MCG/ACT inhaler Inhale 1-2 puffs into the lungs every 4 (four) hours as needed for wheezing or shortness of breath. 01/23/21   Parrett, Virgel Bouquet, NP  ALPRAZolam (XANAX) 0.25 MG tablet TAKE 1 TABLET (0.25 MG TOTAL) BY MOUTH 2 (TWO) TIMES DAILY AS NEEDED  FOR UP TO 30 DOSES FOR ANXIETY. 09/07/23   Anabel Halon, MD  amphetamine-dextroamphetamine (ADDERALL XR) 20 MG 24 hr capsule Take 1 capsule (20 mg total) by mouth as needed. 06/28/23   Gardenia Phlegm, MD  chlorthalidone (HYGROTON) 25 MG tablet Take 1 tablet (25 mg total) by mouth daily. Pt needs to schedule appt. Pt can get a 90 day supply after office visit - 3rd and final attempt 08/22/23   Dyann Kief, PA-C  doxycycline (VIBRAMYCIN) 100 MG capsule Take 1 capsule (100 mg total) by mouth 2 (two) times daily. 07/27/23   Gilda Crease, MD  DULoxetine (CYMBALTA) 20 MG capsule TAKE 1 CAPSULE BY MOUTH EVERY DAY 10/03/23   Billie Lade, MD  DULoxetine (CYMBALTA) 30 MG capsule TAKE 1 CAPSULE BY MOUTH EVERY DAY 07/26/23   Billie Lade, MD  [facility]) OINT Apply 1 application  topically as needed. 09/22/22   Billie Lade, MD  fluticasone (FLONASE) 50 MCG/ACT nasal spray Place 1 spray into both nostrils 2 (two) times daily. 03/05/23   Particia Nearing, PA-C  Galcanezumab-gnlm (EMGALITY) 120 MG/ML SOAJ Inject 120 mg into the skin every 30 (thirty) days. 11/16/22   Billie Lade, MD  prochlorperazine (COMPAZINE) 10 MG tablet Take 1 tablet (10 mg total) by mouth every 6 (six) hours as needed for nausea or vomiting (or headache). 11/08/22   Chesley Noon, MD  QUEtiapine (SEROQUEL) 100 MG tablet Take 1 tablet (100 mg total) by mouth at bedtime. 06/28/23   Gardenia Phlegm, MD  SUMAtriptan (IMITREX) 20 MG/ACT nasal spray Place 1 spray (20 mg total) into the nose every 2 (two) hours as needed for migraine or headache. May repeat in 2 hours if headache persists or recurs. Maximum of 40 mg (2 sprays) in 24 hours. 02/23/23   Billie Lade, MD  tiZANidine (ZANAFLEX) 4 MG capsule TAKE 1 CAPSULE BY MOUTH UP TO EVERY 12HRS AS NEEDED FOR MUSCLE SPASMS (UP TO 30 DOSES) 09/21/23   Anabel Halon, MD    Family History Family History  Adopted: Yes  Problem Relation Age of Onset   Asthma Mother    Heart Problems Mother    Migraines Mother    COPD Mother    Heart attack Mother    Diabetes Father    Peptic Ulcer Father    Heart Problems Father    COPD Father    Heart attack Father    Dementia Maternal Grandmother    Heart Problems Maternal Grandfather    Heart attack Maternal Grandfather    Diabetes Paternal Grandmother    Diabetes Paternal Grandfather    Other Son        Growing pains   Colon cancer Neg Hx    Colon polyps Neg Hx    Esophageal cancer Neg Hx    Rectal cancer Neg Hx    Stomach cancer Neg Hx     Social History Social History   Tobacco Use   Smoking status: Former    Current packs/day: 0.00    Average packs/day: 0.3 packs/day for 17.0 years (4.3 ttl pk-yrs)    Types: Cigarettes    Start date: 03/02/1996    Quit date: 03/02/2013    Years since quitting: 10.7   Smokeless tobacco: Never  Vaping Use   Vaping status: Never Used  Substance Use Topics   Alcohol use: Not Currently   Drug use: Not Currently    Types: Marijuana    Comment:  former - 6 yrs ago     Allergies   Aspirin and Nsaids   Review of Systems Review of Systems Per HPI  Physical Exam Triage Vital Signs ED Triage Vitals  Encounter Vitals Group     BP 12/06/23 1834 122/83     Systolic BP Percentile --      Diastolic BP Percentile --      Pulse Rate 12/06/23 1834 94     Resp 12/06/23 1834 18     Temp 12/06/23 1834 98.5 F (36.9 C)     Temp Source  12/06/23 1834 Oral     SpO2 12/06/23 1834 98 %     Weight --      Height --      Head Circumference --      Peak Flow --      Pain Score 12/06/23 1835 10     Pain Loc --      Pain Education --      Exclude from Growth Chart --    No data found.  Updated Vital Signs BP 122/83 (BP Location: Right Arm)   Pulse 94   Temp 98.5 F (36.9 C) (Oral)   Resp 18   LMP 02/19/2016   SpO2 98%   Visual Acuity Right Eye Distance:   Left Eye Distance:   Bilateral Distance:    Right Eye Near:   Left Eye Near:    Bilateral Near:     Physical Exam Vitals and nursing note reviewed.  Constitutional:      Appearance: Normal appearance.  HENT:     Head: Atraumatic.     Right Ear: Tympanic membrane and external ear normal.     Left Ear: Tympanic membrane and external ear normal.     Nose: Rhinorrhea present.     Mouth/Throat:     Mouth: Mucous membranes are moist.     Pharynx: Posterior oropharyngeal erythema present.  Eyes:     Extraocular Movements: Extraocular movements intact.     Conjunctiva/sclera: Conjunctivae normal.  Cardiovascular:     Rate and Rhythm: Normal rate and regular rhythm.     Heart sounds: Normal heart sounds.  Pulmonary:     Effort: Pulmonary effort is normal.     Breath sounds: Wheezing present. No rales.  Musculoskeletal:        General: Normal range of motion.     Cervical back: Normal range of motion and neck supple.  Skin:    General: Skin is warm and dry.  Neurological:     Mental Status: She is alert and oriented to person, place, and time.  Psychiatric:        Mood and Affect: Mood normal.        Thought Content: Thought content normal.      UC Treatments / Results  Labs (all labs ordered are listed, but only abnormal results are displayed) Labs Reviewed  POC COVID19/FLU A&B COMBO    EKG   Radiology No results found.  Procedures Procedures (including critical care time)  Medications Ordered in UC Medications  predniSONE  (DELTASONE) tablet 40 mg (40 mg Oral Given 12/06/23 1913)    Initial Impression / Assessment and Plan / UC Course  I have reviewed the triage vital signs and the nursing notes.  Pertinent labs & imaging results that were available during my care of the patient were reviewed by me and considered in my medical decision making (see chart for details).     Vitals and exam  overall reassuring today, suspect viral respiratory infection causing asthma exacerbation.  Rapid flu and COVID-negative, dose of prednisone given in clinic, will treat with prednisone, albuterol, Bromfed syrup, supportive over-the-counter medications and home care.  Return for worsening symptoms.  Final Clinical Impressions(s) / UC Diagnoses   Final diagnoses:  Viral URI with cough  Mild intermittent asthma with acute exacerbation   Discharge Instructions   None    ED Prescriptions     Medication Sig Dispense Auth. Provider   predniSONE (DELTASONE) 20 MG tablet Take 2 tablets (40 mg total) by mouth daily with breakfast. 10 tablet Particia Nearing, PA-C   albuterol (VENTOLIN HFA) 108 (90 Base) MCG/ACT inhaler Inhale 2 puffs into the lungs every 4 (four) hours as needed. 18 g Particia Nearing, [place]   brompheniramine-pseudoephedrine-DM 30-2-10 MG/5ML syrup Take 5 mLs by mouth 4 (four) times daily as needed. 120 mL Particia Nearing, [place]      PDMP not reviewed this encounter.   Particia Nearing, [place] 12/09/23 1533

## 2023-12-12 ENCOUNTER — Ambulatory Visit: Payer: No Typology Code available for payment source | Attending: Physician Assistant | Admitting: Physician Assistant

## 2023-12-12 ENCOUNTER — Encounter: Payer: Self-pay | Admitting: Physician Assistant

## 2024-01-01 ENCOUNTER — Other Ambulatory Visit: Payer: Self-pay | Admitting: Family Medicine

## 2024-01-08 ENCOUNTER — Other Ambulatory Visit: Payer: Self-pay

## 2024-01-08 ENCOUNTER — Emergency Department (HOSPITAL_COMMUNITY): Payer: No Typology Code available for payment source

## 2024-01-08 ENCOUNTER — Encounter (HOSPITAL_COMMUNITY): Payer: Self-pay

## 2024-01-08 ENCOUNTER — Emergency Department (HOSPITAL_COMMUNITY)
Admission: EM | Admit: 2024-01-08 | Discharge: 2024-01-08 | Disposition: A | Discharge status: Home / Self Care | Payer: No Typology Code available for payment source | Attending: Emergency Medicine | Admitting: Emergency Medicine

## 2024-01-08 DIAGNOSIS — I2510 Atherosclerotic heart disease of native coronary artery without angina pectoris: Secondary | ICD-10-CM | POA: Diagnosis not present

## 2024-01-08 DIAGNOSIS — R0789 Other chest pain: Secondary | ICD-10-CM | POA: Diagnosis not present

## 2024-01-08 DIAGNOSIS — R079 Chest pain, unspecified: Secondary | ICD-10-CM | POA: Diagnosis present

## 2024-01-08 LAB — HEPATIC FUNCTION PANEL
ALT: 11 U/L (ref 0–44)
AST: 15 U/L (ref 15–41)
Albumin: 3.6 g/dL (ref 3.5–5.0)
Alkaline Phosphatase: 68 U/L (ref 38–126)
Bilirubin, Direct: <0.1 mg/dL (ref 0.0–0.2)
Total Bilirubin: 0.3 mg/dL (ref 0.0–1.2)
Total Protein: 6.5 g/dL (ref 6.5–8.1)

## 2024-01-08 LAB — URINALYSIS, ROUTINE W REFLEX MICROSCOPIC
Bacteria, UA: NONE SEEN
Bilirubin Urine: NEGATIVE
Glucose, UA: NEGATIVE mg/dL
Hgb urine dipstick: NEGATIVE
Ketones, ur: NEGATIVE mg/dL
Leukocytes,Ua: NEGATIVE
Nitrite: NEGATIVE
Protein, ur: NEGATIVE mg/dL
Specific Gravity, Urine: 1.023 (ref 1.005–1.030)
pH: 5 (ref 5.0–8.0)

## 2024-01-08 LAB — CBC
HCT: 37.3 % (ref 36.0–46.0)
Hemoglobin: 11.8 g/dL — ABNORMAL LOW (ref 12.0–15.0)
MCH: 27.5 pg (ref 26.0–34.0)
MCHC: 31.6 g/dL (ref 30.0–36.0)
MCV: 86.9 fL (ref 80.0–100.0)
Platelets: 180 10*3/uL (ref 150–400)
RBC: 4.29 MIL/uL (ref 3.87–5.11)
RDW: 13.3 % (ref 11.5–15.5)
WBC: 3.5 10*3/uL — ABNORMAL LOW (ref 4.0–10.5)
nRBC: 0 % (ref 0.0–0.2)

## 2024-01-08 LAB — BASIC METABOLIC PANEL
Anion gap: 6 (ref 5–15)
BUN: 8 mg/dL (ref 6–20)
CO2: 24 mmol/L (ref 22–32)
Calcium: 8.5 mg/dL — ABNORMAL LOW (ref 8.9–10.3)
Chloride: 105 mmol/L (ref 98–111)
Creatinine, Ser: 0.98 mg/dL (ref 0.44–1.00)
GFR, Estimated: >60 mL/min (ref >60)
Glucose, Bld: 88 mg/dL (ref 70–99)
Potassium: 3.8 mmol/L (ref 3.5–5.1)
Sodium: 135 mmol/L (ref 135–145)

## 2024-01-08 LAB — LIPASE, BLOOD: Lipase: 27 U/L (ref 11–51)

## 2024-01-08 LAB — TROPONIN I (HIGH SENSITIVITY)
Troponin I (High Sensitivity): <2 ng/L (ref <18)
Troponin I (High Sensitivity): <2 ng/L (ref <18)

## 2024-01-08 MED ORDER — OXYCODONE-ACETAMINOPHEN 5-325 MG PO TABS
1.0000 | ORAL_TABLET | Freq: Once | ORAL | Status: AC
  Administered 2024-01-08: 1 via ORAL
  Filled 2024-01-08: qty 1

## 2024-01-08 MED ORDER — ONDANSETRON 4 MG PO TBDP
4.0000 mg | ORAL_TABLET | Freq: Once | ORAL | Status: AC
  Administered 2024-01-08: 4 mg via ORAL
  Filled 2024-01-08: qty 1

## 2024-01-08 NOTE — Discharge Instructions (Signed)

## 2024-01-08 NOTE — ED Triage Notes (Signed)
 Pt c/o chest pain starting last night in her left chest wall. Pt states intermittent pain with more intensity each time, nausea, headache, dizziness, but no vomiting. Pt denies SOB at this time.

## 2024-01-08 NOTE — ED Provider Notes (Signed)
 [name] AT [facility] Provider Note   CSN: [id number] Arrival date & time: 01/08/24  1314     History  Chief Complaint  Patient presents with   Chest Pain    Victoria Moore is a 48 y.o. 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  Patient reports that she she denies diaphoresis.  She is complaining of left arm pain at this time.  She has no previous history of CAD.   Chest Pain      Home Medications Prior to Admission medications   Medication Sig Start Date End Date Taking? Authorizing Provider  acetaminophen  (TYLENOL ) 500 MG tablet Take 1,000 mg by mouth every 6 (six) hours as needed for moderate pain (pain score 4-6).   Yes [provider]  albuterol  (PROVENTIL ) (2.5 MG/3ML) 0.083% nebulizer solution Inhale 3 mLs (2.5 mg total) via nebulizer every 6 (six) hours as needed for wheezing or shortness of breath. 01/24/23  Yes Melvenia Manus BRAVO, MD  albuterol  (VENTOLIN  HFA) 108 (90 Base) MCG/ACT inhaler Inhale 2 puffs into the lungs every 4 (four) hours as needed. 12/06/23  Yes Stuart Vernell Norris, PA-C  chlorthalidone  (HYGROTON ) 25 MG tablet Take 1 tablet (25 mg total) by mouth daily. Pt needs to schedule appt. Pt can get a 90 day supply after office visit - 3rd and final attempt 08/22/23  Yes Parthenia Olivia HERO, PA-C  Cholecalciferol (VITAMIN D3) 1.25 MG (50000 UT) CAPS Take 1 capsule by mouth every Monday.   Yes [provider]  [facility]) OINT Apply 1 application  topically as needed. 09/22/22  Yes Melvenia Manus BRAVO, MD  fluticasone  (FLONASE ) 50 MCG/ACT nasal spray Place 1 spray into both nostrils 2 (two) times daily. 03/05/23  Yes Stuart Vernell Norris, PA-C  QUEtiapine  (SEROQUEL ) 100 MG tablet Take 1 tablet (100 mg total) by mouth at bedtime. 06/28/23  Yes Golda Lynwood PARAS, MD  SUMAtriptan  (IMITREX ) 20 MG/ACT nasal spray Place 1 spray (20 mg total) into the nose every 2 (two) hours as needed for migraine or headache. May repeat in 2 hours if headache persists or recurs. Maximum of 40 mg (2 sprays) in 24 hours. 02/23/23  Yes Melvenia Manus BRAVO, MD  tiZANidine  (ZANAFLEX ) 4 MG capsule TAKE 1 CAPSULE BY MOUTH UP TO EVERY 12HRS AS NEEDED FOR MUSCLE SPASMS (UP TO 30 DOSES) 09/21/23  Yes Tobie Suzzane POUR, MD  albuterol  (VENTOLIN  HFA) 108 (90 Base) MCG/ACT inhaler Inhale 1-2 puffs into the lungs every 4 (four) hours as needed for wheezing or shortness of breath. 01/23/21   Parrett, Madelin RAMAN, NP      Allergies    Aspirin and Nsaids    Review of Systems   Review of Systems  Cardiovascular:  Positive for chest pain.    Physical Exam Updated Vital Signs BP (!) 158/88   Pulse (!) 51   Temp 98.7 F (37.1 C) (Oral)   Resp 13   Ht 6' (1.829 m)   Wt 112.5 kg   LMP 02/19/2016   SpO2 94%   BMI 33.63 kg/m  Physical Exam Vitals and nursing note reviewed.  Constitutional:      General: She is not in acute distress.    Appearance: She is well-developed. She is not diaphoretic.  HENT:     Head: Normocephalic and atraumatic.     Right Ear: External  ear normal.     Left Ear: External ear normal.     Nose: Nose normal.     Mouth/Throat:     Mouth: Mucous membranes are moist.  Eyes:     General: No scleral icterus.    Conjunctiva/sclera: Conjunctivae normal.  Cardiovascular:     Rate and Rhythm: Normal rate and regular rhythm.     Heart sounds: Normal heart sounds. No murmur heard.    No friction rub. No gallop.  Pulmonary:     Effort: Pulmonary effort is normal. No respiratory distress.     Breath sounds: Normal breath sounds.  Abdominal:     General: Bowel sounds are normal. There is no distension.     Palpations: Abdomen is soft. There is no mass.     Tenderness: There is abdominal tenderness in the epigastric area. There is no guarding.  Musculoskeletal:      Cervical back: Normal range of motion.  Skin:    General: Skin is warm and dry.  Neurological:     Mental Status: She is alert and oriented to person, place, and time.  Psychiatric:        Behavior: Behavior normal.     ED Results / Procedures / Treatments   Labs (all labs ordered are listed, but only abnormal results are displayed) Labs Reviewed  BASIC METABOLIC PANEL - Abnormal; Notable for the following components:      Result Value   Calcium  8.5 (*)    All other components within normal limits  CBC - Abnormal; Notable for the following components:   WBC 3.5 (*)    Hemoglobin 11.8 (*)    All other components within normal limits  URINALYSIS, ROUTINE W REFLEX MICROSCOPIC - Abnormal; Notable for the following components:   APPearance HAZY (*)    All other components within normal limits  LIPASE, BLOOD  HEPATIC FUNCTION PANEL  TROPONIN I (HIGH SENSITIVITY)  TROPONIN I (HIGH SENSITIVITY)    EKG None  Radiology DG Chest 2 View Result Date: 01/08/2024 CLINICAL DATA:  Left-sided chest pain since last night. EXAM: CHEST - 2 VIEW COMPARISON:  Chest x-ray dated August 25, 2023. FINDINGS: The heart size and mediastinal contours are within normal limits. Both lungs are clear. The visualized skeletal structures are unremarkable. IMPRESSION: No active cardiopulmonary disease. Electronically Signed   By: Elsie ONEIDA Shoulder M.D.   On: 01/08/2024 14:02    Procedures Procedures    Medications Ordered in ED Medications  oxyCODONE -acetaminophen  (PERCOCET/ROXICET) 5-325 MG per tablet 1 tablet (1 tablet Oral Given 01/08/24 1605)  ondansetron  (ZOFRAN -ODT) disintegrating tablet 4 mg (4 mg Oral Given 01/08/24 1605)    ED Course/ Medical Decision Making/ A&P Clinical Course as of 01/08/24 1847  Sun Jan 08, 2024  1524 CBC(!) [AH]  1524 Basic metabolic panel(!) [AH]    Clinical Course User Index [AH] Arloa Chroman, PA-C             HEART Score: 2                    Medical  Decision Making Amount and/or Complexity of Data Reviewed Labs: ordered. Decision-making details documented in ED Course. Radiology: ordered.  Risk Prescription drug management.   Given the large differential diagnosis for Victoria Moore, the decision making in this case is of high complexity.  After evaluating all of the data points in this case, the presentation of Victoria Moore is NOT consistent with Acute Coronary Syndrome (ACS) and/or myocardial ischemia, pulmonary embolism,  aortic dissection; Borhaave's, significant arrythmia, pneumothorax, cardiac tamponade, or other emergent cardiopulmonary condition.  Further, the presentation of Victoria Moore is NOT consistent with pericarditis, myocarditis, cholecystitis, pancreatitis, mediastinitis, endocarditis, new valvular disease.  Additionally, the presentation of Victoria Moore NOT consistent with flail chest, cardiac contusion, ARDS, or significant intra-thoracic or intra-abdominal bleeding.  Moreover, this presentation is NOT consistent with pneumonia, sepsis, or pyelonephritis.  The patient has a HEART Score: 2     Strict return and follow-up precautions have been given by me personally or by detailed written instruction given verbally by nursing staff using the teach back method to the patient/family/caregiver(s).  Data Reviewed/Counseling: I have reviewed the patient's vital signs, nursing notes, and other relevant tests/information. I had a detailed discussion regarding the historical points, exam findings, and any diagnostic results supporting the discharge diagnosis. I also discussed the need for outpatient follow-up and the need to return to the ED if symptoms worsen or if there are any questions or concerns that arise at home.         Final Clinical Impression(s) / ED Diagnoses Final diagnoses:  Atypical chest pain    Rx / DC Orders ED Discharge Orders     None         Arloa Chroman, PA-C 01/10/24  1626    Cleotilde Rogue, MD 01/10/24 1949

## 2024-01-16 ENCOUNTER — Other Ambulatory Visit: Payer: Self-pay

## 2024-01-16 ENCOUNTER — Emergency Department
Admission: EM | Admit: 2024-01-16 | Discharge: 2024-01-16 | Disposition: A | Discharge status: Home / Self Care | Payer: No Typology Code available for payment source | Attending: Emergency Medicine | Admitting: Emergency Medicine

## 2024-01-16 DIAGNOSIS — G43909 Migraine, unspecified, not intractable, without status migrainosus: Secondary | ICD-10-CM | POA: Diagnosis not present

## 2024-01-16 DIAGNOSIS — R519 Headache, unspecified: Secondary | ICD-10-CM | POA: Diagnosis present

## 2024-01-16 DIAGNOSIS — Z20822 Contact with and (suspected) exposure to covid-19: Secondary | ICD-10-CM | POA: Insufficient documentation

## 2024-01-16 LAB — RESP PANEL BY RT-PCR (RSV, FLU A&B, COVID)  RVPGX2
Influenza A by PCR: NEGATIVE
Influenza B by PCR: NEGATIVE
Resp Syncytial Virus by PCR: NEGATIVE
SARS Coronavirus 2 by RT PCR: NEGATIVE

## 2024-01-16 MED ORDER — METOCLOPRAMIDE HCL 5 MG/ML IJ SOLN
20.0000 mg | Freq: Once | INTRAVENOUS | Status: AC
  Administered 2024-01-16: 20 mg via INTRAVENOUS
  Filled 2024-01-16: qty 4

## 2024-01-16 MED ORDER — DIPHENHYDRAMINE HCL 50 MG/ML IJ SOLN
25.0000 mg | Freq: Once | INTRAMUSCULAR | Status: AC
  Administered 2024-01-16: 25 mg via INTRAVENOUS
  Filled 2024-01-16: qty 1

## 2024-01-16 MED ORDER — SODIUM CHLORIDE 0.9 % IV BOLUS
500.0000 mL | Freq: Once | INTRAVENOUS | Status: AC
  Administered 2024-01-16: 500 mL via INTRAVENOUS

## 2024-01-16 NOTE — ED Notes (Signed)
See triage note  Presents with headache  States headache started on Friday  No n/v or fever   Pt is currently afebrile

## 2024-01-16 NOTE — ED Provider Notes (Signed)
[facility] Provider Note    Event Date/Time   First MD Initiated Contact with Patient 01/16/24 0825     (approximate)   History   Headache   HPI  Victoria Moore is a 48 y.o. female with a history of migraine headaches presents with complaints of a migraine, she reports this is her typical migraine headache and she has no additional concerns.  No neurodeficits.  No fevers, no neck pain     Physical Exam   Triage Vital Signs: ED Triage Vitals  Encounter Vitals Group     BP 01/16/24 0756 120/72     Systolic BP Percentile --      Diastolic BP Percentile --      Pulse Rate 01/16/24 0756 67     Resp 01/16/24 0756 18     Temp 01/16/24 0756 98.5 F (36.9 C)     Temp Source 01/16/24 0756 Oral     SpO2 01/16/24 0756 97 %     Weight 01/16/24 0757 112 kg (247 lb)     Height 01/16/24 0757 1.778 m (5\' 10" )     Head Circumference --      Peak Flow --      Pain Score 01/16/24 0756 10     Pain Loc --      Pain Education --      Exclude from Growth Chart --     Most recent vital signs: Vitals:   01/16/24 0756  BP: 120/72  Pulse: 67  Resp: 18  Temp: 98.5 F (36.9 C)  SpO2: 97%     General: Awake, no distress.  CV:  Good peripheral perfusion.  Resp:  Normal effort.  Abd:  No distention.  Other:  No neurodeficits, cranial nerves intact   ED Results / Procedures / Treatments   Labs (all labs ordered are listed, but only abnormal results are displayed) Labs Reviewed  RESP PANEL BY RT-PCR (RSV, FLU A&B, COVID)  [id number]     EKG     RADIOLOGY     PROCEDURES:  Critical Care performed:   Procedures   MEDICATIONS ORDERED IN ED: Medications  metoCLOPramide (REGLAN) 20 mg in dextrose 5 % 50 mL IVPB (0 mg Intravenous Stopped 01/16/24 1034)  diphenhydrAMINE (BENADRYL) injection 25 mg (25 mg Intravenous Given 01/16/24 0949)  sodium chloride 0.9 % bolus 500 mL (0 mLs Intravenous Stopped 01/16/24 1034)     IMPRESSION / MDM /  ASSESSMENT AND PLAN / ED COURSE  I reviewed the triage vital signs and the nursing notes. Patient's presentation is most consistent with exacerbation of chronic illness.  Patient with a history of migraine headaches presents with complaints of typical migraine global throbbing sensation with light sensitivity.  She has tried her home medications without relief.  She reports she has had to come to the emergency department in the past and has had success with headache cocktail, she does have NSAID allergy so we will give Reglan, Benadryl, IV fluids and reevaluate.  I discussed with her no driving after the Benadryl  Patient much better after treatment, she reports her headache is resolved, appropriate for discharge at this time, return precautions discussed.      FINAL CLINICAL IMPRESSION(S) / ED DIAGNOSES   Final diagnoses:  Migraine without status migrainosus, not intractable, unspecified migraine type     Rx / DC Orders   ED Discharge Orders     None        Note:  This document was  prepared using Conservation officer, historic buildings and may include unintentional dictation errors.   Jene Every, MD 01/16/24 [phone]

## 2024-01-16 NOTE — ED Triage Notes (Signed)
Pt states headache since Friday. Pt denies injury. Pt unsure of sick contacts. Pt denies fevers or chills. Pt states HX of headaches.

## 2024-01-17 ENCOUNTER — Other Ambulatory Visit: Payer: Self-pay

## 2024-01-17 ENCOUNTER — Emergency Department
Admission: EM | Admit: 2024-01-17 | Discharge: 2024-01-17 | Disposition: A | Discharge status: Home / Self Care | Payer: No Typology Code available for payment source | Attending: Emergency Medicine | Admitting: Emergency Medicine

## 2024-01-17 ENCOUNTER — Encounter: Payer: Self-pay | Admitting: Emergency Medicine

## 2024-01-17 ENCOUNTER — Emergency Department: Payer: No Typology Code available for payment source

## 2024-01-17 DIAGNOSIS — R42 Dizziness and giddiness: Secondary | ICD-10-CM | POA: Diagnosis not present

## 2024-01-17 DIAGNOSIS — N189 Chronic kidney disease, unspecified: Secondary | ICD-10-CM | POA: Insufficient documentation

## 2024-01-17 DIAGNOSIS — G43001 Migraine without aura, not intractable, with status migrainosus: Secondary | ICD-10-CM | POA: Insufficient documentation

## 2024-01-17 DIAGNOSIS — R002 Palpitations: Secondary | ICD-10-CM | POA: Insufficient documentation

## 2024-01-17 DIAGNOSIS — R519 Headache, unspecified: Secondary | ICD-10-CM | POA: Diagnosis present

## 2024-01-17 DIAGNOSIS — J45909 Unspecified asthma, uncomplicated: Secondary | ICD-10-CM | POA: Diagnosis not present

## 2024-01-17 LAB — BASIC METABOLIC PANEL
Anion gap: 8 (ref 5–15)
BUN: 9 mg/dL (ref 6–20)
CO2: 23 mmol/L (ref 22–32)
Calcium: 8.3 mg/dL — ABNORMAL LOW (ref 8.9–10.3)
Chloride: 107 mmol/L (ref 98–111)
Creatinine, Ser: 1 mg/dL (ref 0.44–1.00)
GFR, Estimated: >60 mL/min (ref >60)
Glucose, Bld: 80 mg/dL (ref 70–99)
Potassium: 4 mmol/L (ref 3.5–5.1)
Sodium: 138 mmol/L (ref 135–145)

## 2024-01-17 LAB — CBC WITH DIFFERENTIAL/PLATELET
Abs Immature Granulocytes: 0.01 10*3/uL (ref 0.00–0.07)
Basophils Absolute: 0 10*3/uL (ref 0.0–0.1)
Basophils Relative: 1 %
Eosinophils Absolute: 0.2 10*3/uL (ref 0.0–0.5)
Eosinophils Relative: 6 %
HCT: 34.3 % — ABNORMAL LOW (ref 36.0–46.0)
Hemoglobin: 11 g/dL — ABNORMAL LOW (ref 12.0–15.0)
Immature Granulocytes: 0 %
Lymphocytes Relative: 35 %
Lymphs Abs: 1.4 10*3/uL (ref 0.7–4.0)
MCH: 28.3 pg (ref 26.0–34.0)
MCHC: 32.1 g/dL (ref 30.0–36.0)
MCV: 88.2 fL (ref 80.0–100.0)
Monocytes Absolute: 0.4 10*3/uL (ref 0.1–1.0)
Monocytes Relative: 10 %
Neutro Abs: 1.8 10*3/uL (ref 1.7–7.7)
Neutrophils Relative %: 48 %
Platelets: 184 10*3/uL (ref 150–400)
RBC: 3.89 MIL/uL (ref 3.87–5.11)
RDW: 13.2 % (ref 11.5–15.5)
WBC: 3.8 10*3/uL — ABNORMAL LOW (ref 4.0–10.5)
nRBC: 0 % (ref 0.0–0.2)

## 2024-01-17 LAB — TSH: TSH: 1.3 u[IU]/mL (ref 0.350–4.500)

## 2024-01-17 LAB — TROPONIN I (HIGH SENSITIVITY): Troponin I (High Sensitivity): <2 ng/L (ref <18)

## 2024-01-17 MED ORDER — PREDNISONE 10 MG PO TABS: 30.0000 mg | ORAL_TABLET | Freq: Every day | ORAL | 0 refills | Status: DC

## 2024-01-17 MED ORDER — SODIUM CHLORIDE 0.9 % IV BOLUS
1000.0000 mL | Freq: Once | INTRAVENOUS | Status: AC
  Administered 2024-01-17: 1000 mL via INTRAVENOUS

## 2024-01-17 MED ORDER — DEXAMETHASONE SODIUM PHOSPHATE 10 MG/ML IJ SOLN
10.0000 mg | Freq: Once | INTRAMUSCULAR | Status: AC
Start: 2024-01-17 — End: 2024-01-17
  Administered 2024-01-17: 10 mg via INTRAVENOUS
  Filled 2024-01-17: qty 1

## 2024-01-17 MED ORDER — ONDANSETRON HCL 4 MG/2ML IJ SOLN
4.0000 mg | Freq: Once | INTRAMUSCULAR | Status: AC
  Administered 2024-01-17: 4 mg via INTRAVENOUS
  Filled 2024-01-17: qty 2

## 2024-01-17 NOTE — ED Provider Notes (Signed)
[facility] Provider Note    Event Date/Time   First MD Initiated Contact with Patient 01/17/24 [phone]     (approximate)   History   Otalgia   HPI  Victoria Moore is a 48 y.o. female history of anxiety, asthma, CKD, multiple other medical [place] medical history presents emergency department complaining of heart palpitations, headache, ear pain, and chest discomfort.  Patient was seen here yesterday and treated for migraine.  Does have history of migraines.  States the medication was running and very slowly so they did not finish it but the original part had helped.  Denies vomiting/diarrhea, no fever or chills      Physical Exam   Triage Vital Signs: ED Triage Vitals  Encounter Vitals Group     BP 01/17/24 0724 102/72     Systolic BP Percentile --      Diastolic BP Percentile --      Pulse Rate 01/17/24 0724 62     Resp 01/17/24 0724 18     Temp 01/17/24 0724 98.8 F (37.1 C)     Temp Source 01/17/24 0724 Oral     SpO2 01/17/24 0724 98 %     Weight 01/17/24 0722 247 lb (112 kg)     Height 01/17/24 0722 5\' 10"  (1.778 m)     Head Circumference --      Peak Flow --      Pain Score 01/17/24 0722 8     Pain Loc --      Pain Education --      Exclude from Growth Chart --     Most recent vital signs: Vitals:   01/17/24 0724  BP: 102/72  Pulse: 62  Resp: 18  Temp: 98.8 F (37.1 C)  SpO2: 98%     General: Awake, no distress.   CV:  Good peripheral perfusion. regular rate and  rhythm Resp:  Normal effort.  Abd:  No distention.   Other:  C-spine nontender back of the skull is tender on the right reproducing pain on the left, spasm noted in the right trapezius, full range of motion of the neck, neurovascular appears to be intact, cranial nerves II through XII grossly intact   ED Results / Procedures / Treatments   Labs (all labs ordered are listed, but only abnormal results are displayed) Labs Reviewed  BASIC METABOLIC PANEL - Abnormal;  Notable for the following components:      Result Value   Calcium 8.3 (*)    All other components within normal limits  CBC WITH DIFFERENTIAL/PLATELET - Abnormal; Notable for the following components:   WBC 3.8 (*)    Hemoglobin 11.0 (*)    HCT 34.3 (*)    All other components within normal limits  URINALYSIS, ROUTINE W REFLEX MICROSCOPIC  TSH  TROPONIN I (HIGH SENSITIVITY)     EKG  EKG   RADIOLOGY CT of the head    PROCEDURES:   Procedures   MEDICATIONS ORDERED IN ED: Medications  sodium chloride 0.9 % bolus 1,000 mL (1,000 mLs Intravenous New Bag/Given 01/17/24 0920)  ondansetron (ZOFRAN) injection 4 mg (4 mg Intravenous Given 01/17/24 0920)  dexamethasone (DECADRON) injection 10 mg (10 mg Intravenous Given 01/17/24 0920)     IMPRESSION / MDM / ASSESSMENT AND PLAN / ED COURSE  I reviewed the triage vital signs and the nursing notes.  Differential diagnosis includes, but is not limited to, NSTEMI, MI, A-fib, CVA, SAH, migraine, anxiety  Patient's presentation is most consistent with acute illness / injury with system symptoms.   Patient had a respiratory panel yesterday which was negative so do not feel that we should repeat this at this time.  However due to her increasing headache, chest pain and some shortness of breath along with palpitations we will go ahead and do cardiac workup along with CT of the head.  Patient's labs are reassuring, troponin is normal do not feel that we need second troponin as it has been ongoing for greater than 6 hours.   EKG shows sinus bradycardia, see physician read  CT of the head, independently reviewed interpreted by me as being negative for any acute abnormality  I did explain the findings to the patient.  Feel that this time some of these problems are chronic and probably need to see specialist.  Instructed her to follow-up with neurology at [facility], cardiology at [facility].  Phone  numbers were provided.  With the ear pain/dizziness we will do a trial of 30 mg prednisone for 3 days to see if the eustachian tube is part of the problem.  She states she understands.  She is in agreement treatment plan.  She is discharged stable condition with strict instructions to return if worsening   FINAL CLINICAL IMPRESSION(S) / ED DIAGNOSES   Final diagnoses:  Migraine without aura and with status migrainosus, not intractable  Dizziness  Palpitations     Rx / DC Orders   ED Discharge Orders          Ordered    predniSONE (DELTASONE) 10 MG tablet  Daily with breakfast        01/17/24 1148             Note:  This document was prepared using Dragon voice recognition software and may include unintentional dictation errors.    Faythe Ghee, PA-C 01/17/24 1149    Corena Herter, MD 01/17/24 [phone]

## 2024-01-17 NOTE — ED Triage Notes (Signed)
Patient to ED via POV for bilateral ear pain. Started yesterday. States drainage out of left ear.

## 2024-01-17 NOTE — ED Notes (Signed)
See triage note  Presents with bilateral ear pain  States she has had some slight drainage from left ear  Afebrile

## 2024-01-17 NOTE — Discharge Instructions (Addendum)
Your labs today for blood cell counts, metabolic panel, and heart muscle were all normal.  Your TSH level will not return till later today or tomorrow.  Can look up this result on [facility] MyChart as it will not change her treatment that may help another provider to determine causes of palpitations. Follow-up with [facility] neuro for headaches, [facility] clinic cardiology for palpitations.  Return to emergency department if worsening.

## 2024-01-17 NOTE — ED Notes (Signed)
 Patient discharged from ED by provider. Discharge instructions reviewed with patient and all questions answered. Patient ambulatory from ED in NAD.

## 2024-02-01 DIAGNOSIS — K219 Gastro-esophageal reflux disease without esophagitis: Secondary | ICD-10-CM | POA: Diagnosis not present

## 2024-02-01 DIAGNOSIS — E039 Hypothyroidism, unspecified: Secondary | ICD-10-CM | POA: Diagnosis not present

## 2024-02-01 DIAGNOSIS — I10 Essential (primary) hypertension: Secondary | ICD-10-CM | POA: Diagnosis not present

## 2024-02-01 DIAGNOSIS — D509 Iron deficiency anemia, unspecified: Secondary | ICD-10-CM | POA: Diagnosis not present

## 2024-02-01 DIAGNOSIS — G40901 Epilepsy, unspecified, not intractable, with status epilepticus: Secondary | ICD-10-CM | POA: Diagnosis not present

## 2024-02-01 DIAGNOSIS — F331 Major depressive disorder, recurrent, moderate: Secondary | ICD-10-CM | POA: Diagnosis not present

## 2024-02-02 DIAGNOSIS — Z9884 Bariatric surgery status: Secondary | ICD-10-CM | POA: Diagnosis not present

## 2024-02-02 DIAGNOSIS — I10 Essential (primary) hypertension: Secondary | ICD-10-CM | POA: Diagnosis not present

## 2024-02-02 DIAGNOSIS — G40909 Epilepsy, unspecified, not intractable, without status epilepticus: Secondary | ICD-10-CM | POA: Diagnosis not present

## 2024-02-02 DIAGNOSIS — D869 Sarcoidosis, unspecified: Secondary | ICD-10-CM | POA: Diagnosis not present

## 2024-02-02 DIAGNOSIS — J45909 Unspecified asthma, uncomplicated: Secondary | ICD-10-CM | POA: Diagnosis not present

## 2024-02-02 DIAGNOSIS — E559 Vitamin D deficiency, unspecified: Secondary | ICD-10-CM | POA: Diagnosis not present

## 2024-02-02 DIAGNOSIS — N289 Disorder of kidney and ureter, unspecified: Secondary | ICD-10-CM | POA: Diagnosis not present

## 2024-02-02 DIAGNOSIS — G43719 Chronic migraine without aura, intractable, without status migrainosus: Secondary | ICD-10-CM | POA: Diagnosis not present

## 2024-02-05 DIAGNOSIS — Z09 Encounter for follow-up examination after completed treatment for conditions other than malignant neoplasm: Secondary | ICD-10-CM | POA: Diagnosis not present

## 2024-02-05 DIAGNOSIS — R1010 Upper abdominal pain, unspecified: Secondary | ICD-10-CM | POA: Diagnosis not present

## 2024-02-05 DIAGNOSIS — R1032 Left lower quadrant pain: Secondary | ICD-10-CM | POA: Diagnosis not present

## 2024-02-05 DIAGNOSIS — K5900 Constipation, unspecified: Secondary | ICD-10-CM | POA: Diagnosis not present

## 2024-02-05 DIAGNOSIS — E559 Vitamin D deficiency, unspecified: Secondary | ICD-10-CM | POA: Diagnosis not present

## 2024-02-09 DIAGNOSIS — N83202 Unspecified ovarian cyst, left side: Secondary | ICD-10-CM | POA: Diagnosis not present

## 2024-02-09 DIAGNOSIS — R1084 Generalized abdominal pain: Secondary | ICD-10-CM | POA: Diagnosis not present

## 2024-02-14 DIAGNOSIS — G43719 Chronic migraine without aura, intractable, without status migrainosus: Secondary | ICD-10-CM | POA: Diagnosis not present

## 2024-02-14 DIAGNOSIS — G40909 Epilepsy, unspecified, not intractable, without status epilepticus: Secondary | ICD-10-CM | POA: Diagnosis not present

## 2024-02-25 ENCOUNTER — Ambulatory Visit: Payer: Self-pay

## 2024-02-29 DIAGNOSIS — G40909 Epilepsy, unspecified, not intractable, without status epilepticus: Secondary | ICD-10-CM | POA: Diagnosis not present

## 2024-03-05 ENCOUNTER — Ambulatory Visit
Admission: RE | Admit: 2024-03-05 | Discharge: 2024-03-05 | Disposition: A | Discharge status: Home / Self Care | Source: Ambulatory Visit | Attending: Nurse Practitioner | Admitting: Nurse Practitioner

## 2024-03-05 ENCOUNTER — Ambulatory Visit (HOSPITAL_COMMUNITY)
Admission: RE | Admit: 2024-03-05 | Discharge: 2024-03-05 | Disposition: A | Discharge status: Home / Self Care | Source: Ambulatory Visit | Attending: Nurse Practitioner | Admitting: Nurse Practitioner

## 2024-03-05 VITALS — BP 101/71 | HR 58 | Temp 98.2°F | Resp 18

## 2024-03-05 DIAGNOSIS — M25562 Pain in left knee: Secondary | ICD-10-CM | POA: Diagnosis not present

## 2024-03-05 DIAGNOSIS — W109XXA Fall (on) (from) unspecified stairs and steps, initial encounter: Secondary | ICD-10-CM | POA: Insufficient documentation

## 2024-03-05 DIAGNOSIS — M79605 Pain in left leg: Secondary | ICD-10-CM | POA: Diagnosis not present

## 2024-03-05 MED ORDER — KETOROLAC TROMETHAMINE 30 MG/ML IJ SOLN
30.0000 mg | Freq: Once | INTRAMUSCULAR | Status: AC
  Administered 2024-03-05: 30 mg via INTRAMUSCULAR

## 2024-03-05 MED ORDER — TIZANIDINE HCL 4 MG PO TABS: 4.0000 mg | ORAL_TABLET | Freq: Three times a day (TID) | ORAL | 0 refills | Status: DC | PRN

## 2024-03-05 NOTE — ED Provider Notes (Signed)
 [facility]    CSN: [id number] Arrival date & time: 03/05/24  6578      History   Chief Complaint Chief Complaint  Patient presents with   Knee Injury    Entered by patient    HPI Victoria Moore is a 48 y.o. female.   Patient presents today with left leg pain that began this morning.  Reports yesterday, she fell down 2 flights of stairs, landing with her left leg behind her in the "splits" position.  Reports she missed a step.  She did not have any pain immediately and was able to get up and walk without issue.  She denies numbness or tingling in the left distal lower extremity, but is sore from the middle of her thigh down to her shin.  No bruising, swelling, redness.  Reports it has been difficult to bear weight and she has been walking on the tip of her left toes for the pain today.  Has not taken or tried anything for the pain so far.    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 Fibromyalgia, doubt sarcoid    Asthma    Blood transfusion without reported diagnosis    Chest wall pain 05/26/2018   Chronic kidney disease    Chronic low back pain with sciatica 09/15/2018   Common migraine with intractable migraine 10/07/2016   Cough variant asthma vs UACS  10/23/2016   - Spirometry 04/15/2016  Very truncated exp loop effort dep portion only  - Allergy profile 10/22/2016 >  Eos 0.2/  IgE  52 RAST POS grass/trees/ragweed  10/22/2016  After extensive coaching HFA effectiveness =    75% try duelra 100 2bid > improved   Daytime sleepiness 05/26/2018   Decreased pedal pulses 10/04/2018   Depression    Depression, major, single episode, moderate (HCC) 05/26/2018   Diabetes (HCC)    Dyspnea 04/15/2016   04/15/2016  Walked RA x 3 laps @ 185 ft each stopped due to  End of study, nl pace, no  sob or desat    - Spirometry 04/15/2016  Very truncated exp loop effort dep portion only  - 10/22/2016  Walked RA x 3 laps @ 185 ft each stopped due to  End of study, slow pace, min sob/ no desat - full pfts rec 10/22/2016 >>>       Edema    Essential hypertension, benign 11/15/2016   Fibroids 03/11/2016   Frequency of urination 09/11/2018   GERD (gastroesophageal reflux disease)    Herniated lumbar intervertebral disc    Hiatal hernia    Hilar adenopathy    Hypertension    Hypokalemia 06/28/2018   Impaired fasting blood sugar 09/11/2018   Iron deficiency    Leg pain 12/07/2018   Low libido 11/27/2018   Migraine    Mild sleep apnea 08/03/2018   HST 06/20/18  AHI  8.1 / snoring with 02 nadir 80% >  08/03/2018 rec sleep medicine consultation    Morbid (severe) obesity due to excess calories (HCC) 10/23/2016   Personal history of sarcoidosis 05/26/2018   Prolonged capillary refill time    Right leg swelling 08/18/2018   Sarcoidosis    personal history of   Seizures (HCC)  history of    Sleep apnea    Spondylosis    Vitamin D deficiency     Patient Active Problem List   Diagnosis Date Noted   Peripheral neuropathy 10/12/2022   MDD (major depressive disorder) 10/12/2022   History of epilepsy 09/22/2022   Asthmatic bronchitis 01/23/2021   Social anxiety disorder 10/03/2020   Attention deficit hyperactivity disorder (ADHD), 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   S/P gastric bypass 05/12/2020   Intractable abdominal pain 04/27/2020   Benign intracranial hypertension 02/14/2020   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   Complex cyst of left ovary 02/01/2019   Sarcoidosis 02/01/2019   Leg pain 12/07/2018   Low libido 11/27/2018   Decreased pedal pulses 10/04/2018   Chronic low back pain with sciatica 09/15/2018   Impaired fasting blood sugar 09/11/2018   Mild sleep apnea 08/03/2018   Hypokalemia 06/28/2018    Medication side effect 06/22/2018   Vitamin D deficiency 05/26/2018   Encounter for health maintenance examination with abnormal findings 05/26/2018   Depression, major, single episode, moderate (HCC) 05/26/2018   Daytime sleepiness 05/26/2018   GERD (gastroesophageal reflux disease) 05/26/2018   Essential hypertension, benign 11/15/2016   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/10/2016   Class 1 obesity with body mass index (BMI) of 32.0 to 32.9 in adult 10/23/2016   Common migraine with intractable migraine 10/07/2016   DOE (dyspnea on exertion) 04/15/2016   Seizures (HCC) 03/11/2016   Anemia 11/12/2014   Iron deficiency 11/12/2014    Past Surgical History:  Procedure Laterality Date   ABDOMINAL HYSTERECTOMY     ESOPHAGEAL MANOMETRY N/A 09/05/2019   Procedure: ESOPHAGEAL MANOMETRY (EM);  Surgeon: Shellia Cleverly, DO;  Location: [facility];  Service: Gastroenterology;  Laterality: N/A;   LAPAROSCOPIC OVARIAN CYSTECTOMY Left 03/11/2016   Procedure: LAPAROSCOPIC OVARIAN CYSTECTOMY;  Surgeon: Hal Morales, MD;  Location: [facility];  Service: Gynecology;  Laterality: Left;   LAPAROSCOPIC VAGINAL HYSTERECTOMY WITH SALPINGECTOMY Bilateral 03/11/2016   Procedure: LAPAROSCOPIC ASSISTED VAGINAL HYSTERECTOMY WITH SALPINGECTOMY;  Surgeon: Hal Morales, MD;  Location: [facility];  Service: Gynecology;  Laterality: Bilateral;   PH IMPEDANCE STUDY  09/05/2019   Procedure: PH IMPEDANCE STUDY;  Surgeon: Shellia Cleverly, DO;  Location: [facility];  Service: Gastroenterology;;   TUBAL LIGATION     UPPER GASTROINTESTINAL ENDOSCOPY  10/11/2019   06/2019    OB History     Gravida  1   Para      Term      Preterm      AB      Living         SAB      IAB      Ectopic      Multiple      Live Births               Home Medications    Prior to Admission medications   Medication Sig Start Date End Date Taking? Authorizing Provider  tiZANidine (ZANAFLEX) 4 MG tablet Take 1 tablet (4  mg total) by mouth every 8 (eight) hours as needed for muscle spasms. Do not take with alcohol or while driving or operating heavy machinery.  May cause drowsiness. 03/05/24  Yes Valentino Nose, NP  acetaminophen (TYLENOL) 500 MG tablet Take 1,000 mg by mouth every 6 (six) hours as needed for moderate pain (pain score 4-6).    [provider]  albuterol (PROVENTIL) (2.5  MG/3ML) 0.083% nebulizer solution Inhale 3 mLs (2.5 mg total) via nebulizer every 6 (six) hours as needed for wheezing or shortness of breath. 01/24/23   Billie Lade, MD  albuterol (VENTOLIN HFA) 108 (90 Base) MCG/ACT inhaler Inhale 1-2 puffs into the lungs every 4 (four) hours as needed for wheezing or shortness of breath. 01/23/21   Parrett, Virgel Bouquet, NP  albuterol (VENTOLIN HFA) 108 (90 Base) MCG/ACT inhaler Inhale 2 puffs into the lungs every 4 (four) hours as needed. 12/06/23   Particia Nearing, PA-C  chlorthalidone (HYGROTON) 25 MG tablet Take 1 tablet (25 mg total) by mouth daily. Pt needs to schedule appt. Pt can get a 90 day supply after office visit - 3rd and final attempt 08/22/23   Dyann Kief, PA-C  Cholecalciferol (VITAMIN D3) 1.25 MG (50000 UT) CAPS Take 1 capsule by mouth every Monday.    [provider]  [facility]) OINT Apply 1 application  topically as needed. 09/22/22   Billie Lade, MD  fluticasone (FLONASE) 50 MCG/ACT nasal spray Place 1 spray into both nostrils 2 (two) times daily. 03/05/23   Particia Nearing, PA-C  predniSONE (DELTASONE) 10 MG tablet Take 3 tablets (30 mg total) by mouth daily with breakfast. 01/17/24   Sherrie Mustache Roselyn Bering, PA-C  QUEtiapine (SEROQUEL) 100 MG tablet Take 1 tablet (100 mg total) by mouth at bedtime. 06/28/23   Gardenia Phlegm, MD  SUMAtriptan (IMITREX) 20 MG/ACT nasal spray Place 1 spray (20 mg total) into the nose every 2 (two) hours as needed for migraine or headache. May repeat in 2 hours if headache persists or recurs. Maximum of 40 mg (2  sprays) in 24 hours. 02/23/23   Billie Lade, MD    Family History Family History  Adopted: Yes  Problem Relation Age of Onset   Asthma Mother    Heart Problems Mother    Migraines Mother    COPD Mother    Heart attack Mother    Diabetes Father    Peptic Ulcer Father    Heart Problems Father    COPD Father    Heart attack Father    Dementia Maternal Grandmother    Heart Problems Maternal Grandfather    Heart attack Maternal Grandfather    Diabetes Paternal Grandmother    Diabetes Paternal Grandfather    Other Son        Growing pains   Colon cancer Neg Hx    Colon polyps Neg Hx    Esophageal cancer Neg Hx    Rectal cancer Neg Hx    Stomach cancer Neg Hx     Social History Social History   Tobacco Use   Smoking status: Former    Current packs/day: 0.00    Average packs/day: 0.3 packs/day for 17.0 years (4.3 ttl pk-yrs)    Types: Cigarettes    Start date: 03/02/1996    Quit date: 03/02/2013    Years since quitting: 11.0   Smokeless tobacco: Never  Vaping Use   Vaping status: Never Used  Substance Use Topics   Alcohol use: Yes    Comment: occasional   Drug use: Not Currently    Types: Marijuana    Comment: former - 6 yrs ago     Allergies   Aspirin and Nsaids   Review of Systems Review of Systems Per HPI  Physical Exam Triage Vital Signs ED Triage Vitals  Encounter Vitals Group     BP 03/05/24 0901 101/71  Systolic BP Percentile --      Diastolic BP Percentile --      Pulse Rate 03/05/24 0901 (!) 58     Resp 03/05/24 0901 18     Temp 03/05/24 0901 98.2 F (36.8 C)     Temp Source 03/05/24 0901 Oral     SpO2 03/05/24 0901 98 %     Weight --      Height --      Head Circumference --      Peak Flow --      Pain Score 03/05/24 0905 10     Pain Loc --      Pain Education --      Exclude from Growth Chart --    No data found.  Updated Vital Signs BP 101/71 (BP Location: Right Arm)   Pulse (!) 58   Temp 98.2 F (36.8 C) (Oral)   Resp  18   LMP 02/19/2016   SpO2 98%   Visual Acuity Right Eye Distance:   Left Eye Distance:   Bilateral Distance:    Right Eye Near:   Left Eye Near:    Bilateral Near:     Physical Exam Vitals and nursing note reviewed.  Constitutional:      General: She is not in acute distress.    Appearance: Normal appearance. She is not toxic-appearing.  HENT:     Mouth/Throat:     Mouth: Mucous membranes are moist.     Pharynx: Oropharynx is clear.  Pulmonary:     Effort: Pulmonary effort is normal. No respiratory distress.  Musculoskeletal:     Comments: Inspection: no swelling, bruising, obvious deformity or redness from left mid femur to left medial shin Palpation: tender to palpation left mid femur to left medial shin diffusely; no obvious deformities palpated ROM: Full ROM to left lower extremity Strength: 5/5 left lower extremity Neurovascular: neurovascularly intact in distal left lower extremity  Skin:    General: Skin is warm and dry.     Capillary Refill: Capillary refill takes less than 2 seconds.     Coloration: Skin is not jaundiced or pale.     Findings: No erythema.  Neurological:     Mental Status: She is alert and oriented to person, place, and time.  Psychiatric:        Behavior: Behavior is cooperative.      UC Treatments / Results  Labs (all labs ordered are listed, but only abnormal results are displayed) Labs Reviewed - No data to display  EKG   Radiology No results found.  Procedures Procedures (including critical care time)  Medications Ordered in UC Medications  ketorolac (TORADOL) 30 MG/ML injection 30 mg (30 mg Intramuscular Given 03/05/24 0948)    Initial Impression / Assessment and Plan / UC Course  I have reviewed the triage vital signs and the nursing notes.  Pertinent labs & imaging results that were available during my care of the patient were reviewed by me and considered in my medical decision making (see chart for details).    Patient is well-appearing, normotensive, afebrile, not tachycardic, not tachypneic, oxygenating well on room air.    1. Acute leg pain, left 2. Acute pain of left knee No red flags Vitals and exam are reassuring today Will obtain xray imaging of left knee today to rule out acute bony abnormality In the meantime, treat with Ace wrap, ice, compression, elevation Pain treated with Toradol 30 mg IM in urgent care today; last GFR greater  than 60 Avoid NSAIDs for 48 hours, start Tylenol and then after 48 hours, can alternate with NSAIDs that she tolerates Patient reports she tolerates everything except aspirin even though NSAIDs are marked as an allergy I also sent muscle relaxant to the pharmacy for muscular pain Strict ER and return precautions discussed Work excuse provided  The patient was given the opportunity to ask questions.  All questions answered to their satisfaction.  The patient is in agreement to this plan.    Final Clinical Impressions(s) / UC Diagnoses   Final diagnoses:  Acute leg pain, left  Acute pain of left knee     Discharge Instructions      Go to the imaging department at [facility] after leaving urgent care today for the x-ray.  We will contact you later today with the results.  Please wear the Ace wrap whenever you are up walking around.  When you are sitting down, elevate your left leg and apply ice 15 minutes on, 45 minutes off every hour while awake.  We have given you an injection of Toradol today, do not take any other NSAIDs for 48 hours.  You can take Tylenol 500 to 1000 mg every 6 hours and or use a muscle relaxant every 8 hours as needed for muscular pain.     ED Prescriptions     Medication Sig Dispense Auth. Provider   tiZANidine (ZANAFLEX) 4 MG tablet Take 1 tablet (4 mg total) by mouth every 8 (eight) hours as needed for muscle spasms. Do not take with alcohol or while driving or operating heavy machinery.  May cause drowsiness. 30 tablet  Valentino Nose, NP      PDMP not reviewed this encounter.   Valentino Nose, NP 03/05/24 1002

## 2024-03-05 NOTE — Discharge Instructions (Signed)
 Go to the imaging department at [facility] after leaving urgent care today for the x-ray.  We will contact you later today with the results.  Please wear the Ace wrap whenever you are up walking around.  When you are sitting down, elevate your left leg and apply ice 15 minutes on, 45 minutes off every hour while awake.  We have given you an injection of Toradol today, do not take any other NSAIDs for 48 hours.  You can take Tylenol 500 to 1000 mg every 6 hours and or use a muscle relaxant every 8 hours as needed for muscular pain.

## 2024-03-05 NOTE — ED Triage Notes (Signed)
 Pt reports fall that occurred yesterday down two flights of steps landing on her left leg. Pain is present from mid thigh down below knee cap. Pt states it hurts to bear weight on the leg.

## 2024-03-18 DIAGNOSIS — M5442 Lumbago with sciatica, left side: Secondary | ICD-10-CM | POA: Diagnosis not present

## 2024-03-18 DIAGNOSIS — M5416 Radiculopathy, lumbar region: Secondary | ICD-10-CM | POA: Diagnosis not present

## 2024-03-18 DIAGNOSIS — M79605 Pain in left leg: Secondary | ICD-10-CM | POA: Diagnosis not present

## 2024-03-18 DIAGNOSIS — M5412 Radiculopathy, cervical region: Secondary | ICD-10-CM | POA: Diagnosis not present

## 2024-03-18 DIAGNOSIS — M542 Cervicalgia: Secondary | ICD-10-CM | POA: Diagnosis not present

## 2024-03-25 ENCOUNTER — Ambulatory Visit
Admission: RE | Admit: 2024-03-25 | Discharge: 2024-03-25 | Disposition: A | Discharge status: Home / Self Care | Payer: Self-pay | Source: Ambulatory Visit | Attending: Internal Medicine | Admitting: Internal Medicine

## 2024-03-25 VITALS — BP 127/80 | HR 65 | Temp 99.0°F | Resp 18

## 2024-03-25 DIAGNOSIS — S0990XA Unspecified injury of head, initial encounter: Secondary | ICD-10-CM | POA: Diagnosis not present

## 2024-03-25 NOTE — ED Triage Notes (Signed)
 Hit top of head on cabinet this morning.  States left side of head hurts.  States area is tender on top of head.  No loss of consciousness.

## 2024-03-25 NOTE — Discharge Instructions (Signed)
 Continue Tylenol for pain, headache, or general discomfort. Apply ice to the head to help with pain or swelling. Avoid excessive eyestrain, including prolonged cell phone use, bright lights, or long periods of watching the television. Make sure you are getting at least 8 to 10 hours of sleep for the next several days. Go to the emergency department immediately if you experience any change in your mental status, become difficult to arouse, increased dizziness, slurred speech, or other concerns. Please follow-up with your primary care physician this week for reevaluation. Follow-up as needed.

## 2024-03-25 NOTE — ED Provider Notes (Signed)
 [facility]    CSN: [id number] Arrival date & time: 03/25/24  1148      History   Chief Complaint Chief Complaint  Patient presents with   Head Injury    Hit head really hard on kitchen cabinet door. Bad headache, tenderness and dizziness. - Entered by patient    HPI Victoria Moore is a 48 y.o. female.   The history is provided by the patient.   Patient presents for complaints of headache, increased fatigue, head tenderness, and mild dizziness after she hit her head on the corner of a cabinet door this morning.  Patient states she did not lose consciousness.  Reports she is not taking any blood thinning medication.  She denies slurred speech, visual changes, light sensitivity, sound sensitivity, lower extremity weakness, lower extremity numbness or tingling, uncoordinated gait, or slurred speech.  Patient states that she did fill "funny after she hit her head."  States that she took Tylenol for her symptoms. 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 Fibromyalgia, doubt sarcoid    Asthma    Blood transfusion without reported diagnosis    Chest wall pain 05/26/2018   Chronic kidney disease    Chronic low back pain with sciatica 09/15/2018   Common migraine with intractable migraine 10/07/2016   Cough variant asthma vs UACS  10/23/2016   - Spirometry 04/15/2016  Very truncated exp loop effort dep portion only  - Allergy profile 10/22/2016 >  Eos 0.2/  IgE  52 RAST POS grass/trees/ragweed  10/22/2016  After extensive coaching HFA effectiveness =    75% try duelra 100 2bid > improved   Daytime sleepiness 05/26/2018   Decreased pedal pulses 10/04/2018   Depression    Depression, major, single episode, moderate (HCC) 05/26/2018   Diabetes (HCC)    Dyspnea 04/15/2016   04/15/2016  Walked  RA x 3 laps @ 185 ft each stopped due to  End of study, nl pace, no sob or desat    - Spirometry 04/15/2016  Very truncated exp loop effort dep portion only  - 10/22/2016  Walked RA x 3 laps @ 185 ft each stopped due to  End of study, slow pace, min sob/ no desat - full pfts rec 10/22/2016 >>>       Edema    Essential hypertension, benign 11/15/2016   Fibroids 03/11/2016   Frequency of urination 09/11/2018   GERD (gastroesophageal reflux disease)    Herniated lumbar intervertebral disc    Hiatal hernia    Hilar adenopathy    Hypertension    Hypokalemia 06/28/2018   Impaired fasting blood sugar 09/11/2018   Iron deficiency    Leg pain 12/07/2018   Low libido 11/27/2018   Migraine    Mild sleep apnea 08/03/2018   HST 06/20/18  AHI  8.1 / snoring with 02 nadir 80% >  08/03/2018 rec sleep medicine consultation    Morbid (severe) obesity due to excess calories (HCC) 10/23/2016   Personal history of sarcoidosis 05/26/2018   Prolonged capillary refill time    Right leg swelling 08/18/2018   Sarcoidosis    personal history of   Seizures (HCC)    history of    Sleep apnea  Spondylosis    Vitamin D deficiency     Patient Active Problem List   Diagnosis Date Noted   Peripheral neuropathy 10/12/2022   MDD (major depressive disorder) 10/12/2022   History of epilepsy 09/22/2022   Asthmatic bronchitis 01/23/2021   Social anxiety disorder 10/03/2020   Attention deficit hyperactivity disorder (ADHD), 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   S/P gastric bypass 05/12/2020   Intractable abdominal pain 04/27/2020   Benign intracranial hypertension 02/14/2020   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   Complex cyst of left ovary 02/01/2019   Sarcoidosis 02/01/2019   Leg pain 12/07/2018   Low libido 11/27/2018   Decreased pedal pulses 10/04/2018   Chronic low back pain with sciatica 09/15/2018   Impaired fasting blood sugar  09/11/2018   Mild sleep apnea 08/03/2018   Hypokalemia 06/28/2018   Medication side effect 06/22/2018   Vitamin D deficiency 05/26/2018   Encounter for health maintenance examination with abnormal findings 05/26/2018   Depression, major, single episode, moderate (HCC) 05/26/2018   Daytime sleepiness 05/26/2018   GERD (gastroesophageal reflux disease) 05/26/2018   Essential hypertension, benign 11/15/2016   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/10/2016   Class 1 obesity with body mass index (BMI) of 32.0 to 32.9 in adult 10/23/2016   Common migraine with intractable migraine 10/07/2016   DOE (dyspnea on exertion) 04/15/2016   Seizures (HCC) 03/11/2016   Anemia 11/12/2014   Iron deficiency 11/12/2014    Past Surgical History:  Procedure Laterality Date   ABDOMINAL HYSTERECTOMY     ESOPHAGEAL MANOMETRY N/A 09/05/2019   Procedure: ESOPHAGEAL MANOMETRY (EM);  Surgeon: Shellia Cleverly, DO;  Location: [facility];  Service: Gastroenterology;  Laterality: N/A;   LAPAROSCOPIC OVARIAN CYSTECTOMY Left 03/11/2016   Procedure: LAPAROSCOPIC OVARIAN CYSTECTOMY;  Surgeon: Hal Morales, MD;  Location: [facility];  Service: Gynecology;  Laterality: Left;   LAPAROSCOPIC VAGINAL HYSTERECTOMY WITH SALPINGECTOMY Bilateral 03/11/2016   Procedure: LAPAROSCOPIC ASSISTED VAGINAL HYSTERECTOMY WITH SALPINGECTOMY;  Surgeon: Hal Morales, MD;  Location: [facility];  Service: Gynecology;  Laterality: Bilateral;   PH IMPEDANCE STUDY  09/05/2019   Procedure: PH IMPEDANCE STUDY;  Surgeon: Shellia Cleverly, DO;  Location: [facility];  Service: Gastroenterology;;   TUBAL LIGATION     UPPER GASTROINTESTINAL ENDOSCOPY  10/11/2019   06/2019    OB History     Gravida  1   Para      Term      Preterm      AB      Living         SAB      IAB      Ectopic      Multiple      Live Births               Home Medications    Prior to Admission medications   Medication Sig Start Date End Date Taking?  Authorizing Provider  acetaminophen (TYLENOL) 500 MG tablet Take 1,000 mg by mouth every 6 (six) hours as needed for moderate pain (pain score 4-6).    [provider]  albuterol (PROVENTIL) (2.5 MG/3ML) 0.083% nebulizer solution Inhale 3 mLs (2.5 mg total) via nebulizer every 6 (six) hours as needed for wheezing or shortness of breath. 01/24/23   Billie Lade, MD  albuterol (VENTOLIN HFA) 108 (90 Base) MCG/ACT inhaler Inhale 2 puffs into the lungs every 4 (four) hours as needed. 12/06/23   Maurice March,  Salley Hews, PA-C  chlorthalidone (HYGROTON) 25 MG tablet Take 1 tablet (25 mg total) by mouth daily. Pt needs to schedule appt. Pt can get a 90 day supply after office visit - 3rd and final attempt 08/22/23   Dyann Kief, PA-C  Cholecalciferol (VITAMIN D3) 1.25 MG (50000 UT) CAPS Take 1 capsule by mouth every Monday.    [provider]  fluticasone (FLONASE) 50 MCG/ACT nasal spray Place 1 spray into both nostrils 2 (two) times daily. 03/05/23   Particia Nearing, PA-C  QUEtiapine (SEROQUEL) 100 MG tablet Take 1 tablet (100 mg total) by mouth at bedtime. 06/28/23   Gardenia Phlegm, MD  SUMAtriptan (IMITREX) 20 MG/ACT nasal spray Place 1 spray (20 mg total) into the nose every 2 (two) hours as needed for migraine or headache. May repeat in 2 hours if headache persists or recurs. Maximum of 40 mg (2 sprays) in 24 hours. 02/23/23   Billie Lade, MD  tiZANidine (ZANAFLEX) 4 MG tablet Take 1 tablet (4 mg total) by mouth every 8 (eight) hours as needed for muscle spasms. Do not take with alcohol or while driving or operating heavy machinery.  May cause drowsiness. 03/05/24   Valentino Nose, NP    Family History Family History  Adopted: Yes  Problem Relation Age of Onset   Asthma Mother    Heart Problems Mother    Migraines Mother    COPD Mother    Heart attack Mother    Diabetes Father    Peptic Ulcer Father    Heart Problems Father    COPD Father    Heart attack  Father    Dementia Maternal Grandmother    Heart Problems Maternal Grandfather    Heart attack Maternal Grandfather    Diabetes Paternal Grandmother    Diabetes Paternal Grandfather    Other Son        Growing pains   Colon cancer Neg Hx    Colon polyps Neg Hx    Esophageal cancer Neg Hx    Rectal cancer Neg Hx    Stomach cancer Neg Hx     Social History Social History   Tobacco Use   Smoking status: Former    Current packs/day: 0.00    Average packs/day: 0.3 packs/day for 17.0 years (4.3 ttl pk-yrs)    Types: Cigarettes    Start date: 03/02/1996    Quit date: 03/02/2013    Years since quitting: 11.0   Smokeless tobacco: Never  Vaping Use   Vaping status: Never Used  Substance Use Topics   Alcohol use: Yes    Comment: occasional   Drug use: Not Currently    Types: Marijuana    Comment: former - 6 yrs ago     Allergies   Aspirin and Nsaids   Review of Systems Review of Systems Per HPI  Physical Exam Triage Vital Signs ED Triage Vitals  Encounter Vitals Group     BP 03/25/24 1154 127/80     Systolic BP Percentile --      Diastolic BP Percentile --      Pulse Rate 03/25/24 1154 65     Resp 03/25/24 1154 18     Temp 03/25/24 1154 99 F (37.2 C)     Temp Source 03/25/24 1154 Oral     SpO2 03/25/24 1154 95 %     Weight --      Height --      Head Circumference --  Peak Flow --      Pain Score 03/25/24 1156 10     Pain Loc --      Pain Education --      Exclude from Growth Chart --    No data found.  Updated Vital Signs BP 127/80 (BP Location: Right Arm)   Pulse 65   Temp 99 F (37.2 C) (Oral)   Resp 18   LMP 02/19/2016   SpO2 95%   Visual Acuity Right Eye Distance:   Left Eye Distance:   Bilateral Distance:    Right Eye Near:   Left Eye Near:    Bilateral Near:     Physical Exam Vitals and nursing note reviewed.  Constitutional:      General: She is not in acute distress.    Appearance: Normal appearance.  HENT:     Head:  Normocephalic.     Right Ear: Tympanic membrane, ear canal and external ear normal.     Left Ear: Tympanic membrane, ear canal and external ear normal.  Eyes:     General: No visual field deficit.    Extraocular Movements: Extraocular movements intact.     Conjunctiva/sclera: Conjunctivae normal.     Pupils: Pupils are equal, round, and reactive to light.  Cardiovascular:     Rate and Rhythm: Normal rate and regular rhythm.     Pulses: Normal pulses.     Heart sounds: Normal heart sounds.  Pulmonary:     Effort: Pulmonary effort is normal. No respiratory distress.     Breath sounds: Normal breath sounds. No stridor. No wheezing, rhonchi or rales.  Abdominal:     General: Bowel sounds are normal.     Palpations: Abdomen is soft.     Tenderness: There is no abdominal tenderness.  Musculoskeletal:     Cervical back: Normal range of motion.  Lymphadenopathy:     Cervical: No cervical adenopathy.  Skin:    General: Skin is warm and dry.  Neurological:     General: No focal deficit present.     Mental Status: She is alert and oriented to person, place, and time.     GCS: GCS eye subscore is 4. GCS verbal subscore is 5. GCS motor subscore is 6.     Cranial Nerves: Cranial nerves 2-12 are intact. No cranial nerve deficit, dysarthria or facial asymmetry.     Sensory: Sensation is intact.     Motor: Motor function is intact. No weakness or pronator drift.     Coordination: Coordination is intact. Coordination normal.     Gait: Gait is intact.  Psychiatric:        Mood and Affect: Mood normal.        Behavior: Behavior normal.      UC Treatments / Results  Labs (all labs ordered are listed, but only abnormal results are displayed) Labs Reviewed - No data to display  EKG   Radiology No results found.  Procedures Procedures (including critical care time)  Medications Ordered in UC Medications - No data to display  Initial Impression / Assessment and Plan / UC Course  I  have reviewed the triage vital signs and the nursing notes.  Pertinent labs & imaging results that were available during my care of the patient were reviewed by me and considered in my medical decision making (see chart for details).  Neurological exam is without any deficits.  Patient may be experiencing some symptoms of a mild concussion.  Supportive care recommendations were  provided and discussed with the patient to include continuing over-the-counter Tylenol, applying ice to the top of her head for pain or swelling, avoiding bright lights, loud noises, avoiding telephone use, and excessive eyestrain.  Patient was advised regarding ER follow-up precautions.  Patient was advised to follow-up with her PCP within the week for reevaluation.  Patient was in agreement with this plan of care and verbalized understanding.  All questions were answered.  Patient stable for discharge.  Work note was provided.  Final Clinical Impressions(s) / UC Diagnoses   Final diagnoses:  None   Discharge Instructions   None    ED Prescriptions   None    PDMP not reviewed this encounter.   Abran Cantor, NP 03/25/24 1213

## 2024-03-28 ENCOUNTER — Encounter: Payer: 59 | Admitting: Family Medicine

## 2024-03-29 ENCOUNTER — Ambulatory Visit: Payer: Self-pay

## 2024-03-29 ENCOUNTER — Ambulatory Visit
Admission: EM | Admit: 2024-03-29 | Discharge: 2024-03-29 | Disposition: A | Discharge status: Home / Self Care | Attending: Nurse Practitioner | Admitting: Nurse Practitioner

## 2024-03-29 DIAGNOSIS — K0889 Other specified disorders of teeth and supporting structures: Secondary | ICD-10-CM | POA: Diagnosis not present

## 2024-03-29 MED ORDER — KETOROLAC TROMETHAMINE 30 MG/ML IJ SOLN
30.0000 mg | Freq: Once | INTRAMUSCULAR | Status: AC
  Administered 2024-03-29: 30 mg via INTRAMUSCULAR

## 2024-03-29 MED ORDER — TRAMADOL HCL 50 MG PO TABS: 50.0000 mg | ORAL_TABLET | Freq: Two times a day (BID) | ORAL | 0 refills | Status: DC | PRN

## 2024-03-29 MED ORDER — AMOXICILLIN-POT CLAVULANATE 875-125 MG PO TABS: 1.0000 | ORAL_TABLET | Freq: Two times a day (BID) | ORAL | 0 refills | Status: DC

## 2024-03-29 NOTE — Discharge Instructions (Addendum)
 You were given an injection of Toradol 30mg .  Take medication as prescribed. May take Tylenol 2 hours after Tramadol for breakthrough pain. Warm salt water gargles 3-4 times daily until symptoms improve. Continue warm compresses to the affected area to help with pain and discomfort. Follow-up with your dentist as scheduled.  Follow-up as needed.

## 2024-03-29 NOTE — ED Provider Notes (Signed)
 [facility]    CSN: [id number] Arrival date & time: 03/29/24  1350      History   Chief Complaint No chief complaint on file.   HPI Victoria Moore is a 48 y.o. female.   The history is provided by the patient.   Patient presents for complaints of pain to the right lower portion of her mouth.  Patient states symptoms started around 3 AM this morning.  She states that she has had swelling to the right side of her face.  Reports she has taken Tylenol with minimal relief.  She denies fractured tooth, fever, chills, nausea, vomiting, diarrhea, or rash.  Patient states that she has contacted her dentist, but she cannot be seen for another 1 to 2 weeks.  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 Fibromyalgia, doubt sarcoid    Asthma    Blood transfusion without reported diagnosis    Chest wall pain 05/26/2018   Chronic kidney disease    Chronic low back pain with sciatica 09/15/2018   Common migraine with intractable migraine 10/07/2016   Cough variant asthma vs UACS  10/23/2016   - Spirometry 04/15/2016  Very truncated exp loop effort dep portion only  - Allergy profile 10/22/2016 >  Eos 0.2/  IgE  52 RAST POS grass/trees/ragweed  10/22/2016  After extensive coaching HFA effectiveness =    75% try duelra 100 2bid > improved   Daytime sleepiness 05/26/2018   Decreased pedal pulses 10/04/2018   Depression    Depression, major, single episode, moderate (HCC) 05/26/2018   Diabetes (HCC)    Dyspnea 04/15/2016   04/15/2016  Walked RA x 3 laps @ 185 ft each stopped due to  End of study, nl pace, no sob or desat    - Spirometry 04/15/2016  Very truncated exp loop effort dep portion only  - 10/22/2016  Walked RA x 3 laps @ 185 ft each stopped due to  End of study, slow pace, min sob/ no desat - full  pfts rec 10/22/2016 >>>       Edema    Essential hypertension, benign 11/15/2016   Fibroids 03/11/2016   Frequency of urination 09/11/2018   GERD (gastroesophageal reflux disease)    Herniated lumbar intervertebral disc    Hiatal hernia    Hilar adenopathy    Hypertension    Hypokalemia 06/28/2018   Impaired fasting blood sugar 09/11/2018   Iron deficiency    Leg pain 12/07/2018   Low libido 11/27/2018   Migraine    Mild sleep apnea 08/03/2018   HST 06/20/18  AHI  8.1 / snoring with 02 nadir 80% >  08/03/2018 rec sleep medicine consultation    Morbid (severe) obesity due to excess calories (HCC) 10/23/2016   Personal history of sarcoidosis 05/26/2018   Prolonged capillary refill time    Right leg swelling 08/18/2018   Sarcoidosis    personal history of   Seizures (HCC)    history of    Sleep apnea    Spondylosis    Vitamin D deficiency     Patient Active Problem List   Diagnosis Date Noted   Peripheral neuropathy 10/12/2022   MDD (major depressive disorder) 10/12/2022   History of  epilepsy 09/22/2022   Asthmatic bronchitis 01/23/2021   Social anxiety disorder 10/03/2020   Attention deficit hyperactivity disorder (ADHD), 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   S/P gastric bypass 05/12/2020   Intractable abdominal pain 04/27/2020   Benign intracranial hypertension 02/14/2020   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   Complex cyst of left ovary 02/01/2019   Sarcoidosis 02/01/2019   Leg pain 12/07/2018   Low libido 11/27/2018   Decreased pedal pulses 10/04/2018   Chronic low back pain with sciatica 09/15/2018   Impaired fasting blood sugar 09/11/2018   Mild sleep apnea 08/03/2018   Hypokalemia 06/28/2018   Medication side effect 06/22/2018   Vitamin D deficiency 05/26/2018   Encounter for health maintenance examination with abnormal findings 05/26/2018   Depression, major, single episode, moderate (HCC)  05/26/2018   Daytime sleepiness 05/26/2018   GERD (gastroesophageal reflux disease) 05/26/2018   Essential hypertension, benign 11/15/2016   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/10/2016   Class 1 obesity with body mass index (BMI) of 32.0 to 32.9 in adult 10/23/2016   Common migraine with intractable migraine 10/07/2016   DOE (dyspnea on exertion) 04/15/2016   Seizures (HCC) 03/11/2016   Anemia 11/12/2014   Iron deficiency 11/12/2014    Past Surgical History:  Procedure Laterality Date   ABDOMINAL HYSTERECTOMY     ESOPHAGEAL MANOMETRY N/A 09/05/2019   Procedure: ESOPHAGEAL MANOMETRY (EM);  Surgeon: Shellia Cleverly, DO;  Location: [facility];  Service: Gastroenterology;  Laterality: N/A;   LAPAROSCOPIC OVARIAN CYSTECTOMY Left 03/11/2016   Procedure: LAPAROSCOPIC OVARIAN CYSTECTOMY;  Surgeon: Hal Morales, MD;  Location: [facility];  Service: Gynecology;  Laterality: Left;   LAPAROSCOPIC VAGINAL HYSTERECTOMY WITH SALPINGECTOMY Bilateral 03/11/2016   Procedure: LAPAROSCOPIC ASSISTED VAGINAL HYSTERECTOMY WITH SALPINGECTOMY;  Surgeon: Hal Morales, MD;  Location: [facility];  Service: Gynecology;  Laterality: Bilateral;   PH IMPEDANCE STUDY  09/05/2019   Procedure: PH IMPEDANCE STUDY;  Surgeon: Shellia Cleverly, DO;  Location: [facility];  Service: Gastroenterology;;   TUBAL LIGATION     UPPER GASTROINTESTINAL ENDOSCOPY  10/11/2019   06/2019    OB History     Gravida  1   Para      Term      Preterm      AB      Living         SAB      IAB      Ectopic      Multiple      Live Births               Home Medications    Prior to Admission medications   Medication Sig Start Date End Date Taking? Authorizing Provider  amoxicillin-clavulanate (AUGMENTIN) 875-125 MG tablet Take 1 tablet by mouth every 12 (twelve) hours. 03/29/24  Yes Leath-Warren, Sadie Haber, NP  traMADol (ULTRAM) 50 MG tablet Take 1 tablet (50 mg total) by mouth every 12 (twelve) hours as needed. 03/29/24   Yes Leath-Warren, Sadie Haber, NP  acetaminophen (TYLENOL) 500 MG tablet Take 1,000 mg by mouth every 6 (six) hours as needed for moderate pain (pain score 4-6).    [provider]  albuterol (PROVENTIL) (2.5 MG/3ML) 0.083% nebulizer solution Inhale 3 mLs (2.5 mg total) via nebulizer every 6 (six) hours as needed for wheezing or shortness of breath. 01/24/23   Billie Lade, MD  albuterol (VENTOLIN HFA) 108 (90 Base) MCG/ACT inhaler Inhale 2 puffs  into the lungs every 4 (four) hours as needed. 12/06/23   Particia Nearing, PA-C  chlorthalidone (HYGROTON) 25 MG tablet Take 1 tablet (25 mg total) by mouth daily. Pt needs to schedule appt. Pt can get a 90 day supply after office visit - 3rd and final attempt 08/22/23   Dyann Kief, PA-C  Cholecalciferol (VITAMIN D3) 1.25 MG (50000 UT) CAPS Take 1 capsule by mouth every Monday.    [provider]  fluticasone (FLONASE) 50 MCG/ACT nasal spray Place 1 spray into both nostrils 2 (two) times daily. 03/05/23   Particia Nearing, PA-C  QUEtiapine (SEROQUEL) 100 MG tablet Take 1 tablet (100 mg total) by mouth at bedtime. 06/28/23   Gardenia Phlegm, MD  SUMAtriptan (IMITREX) 20 MG/ACT nasal spray Place 1 spray (20 mg total) into the nose every 2 (two) hours as needed for migraine or headache. May repeat in 2 hours if headache persists or recurs. Maximum of 40 mg (2 sprays) in 24 hours. 02/23/23   Billie Lade, MD  tiZANidine (ZANAFLEX) 4 MG tablet Take 1 tablet (4 mg total) by mouth every 8 (eight) hours as needed for muscle spasms. Do not take with alcohol or while driving or operating heavy machinery.  May cause drowsiness. 03/05/24   Valentino Nose, NP    Family History Family History  Adopted: Yes  Problem Relation Age of Onset   Asthma Mother    Heart Problems Mother    Migraines Mother    COPD Mother    Heart attack Mother    Diabetes Father    Peptic Ulcer Father    Heart Problems Father    COPD Father    Heart  attack Father    Dementia Maternal Grandmother    Heart Problems Maternal Grandfather    Heart attack Maternal Grandfather    Diabetes Paternal Grandmother    Diabetes Paternal Grandfather    Other Son        Growing pains   Colon cancer Neg Hx    Colon polyps Neg Hx    Esophageal cancer Neg Hx    Rectal cancer Neg Hx    Stomach cancer Neg Hx     Social History Social History   Tobacco Use   Smoking status: Former    Current packs/day: 0.00    Average packs/day: 0.3 packs/day for 17.0 years (4.3 ttl pk-yrs)    Types: Cigarettes    Start date: 03/02/1996    Quit date: 03/02/2013    Years since quitting: 11.0   Smokeless tobacco: Never  Vaping Use   Vaping status: Never Used  Substance Use Topics   Alcohol use: Yes    Comment: occasional   Drug use: Not Currently    Types: Marijuana    Comment: former - 6 yrs ago     Allergies   Aspirin and Nsaids   Review of Systems Review of Systems Per HPI  Physical Exam Triage Vital Signs ED Triage Vitals  Encounter Vitals Group     BP 03/29/24 1357 126/83     Systolic BP Percentile --      Diastolic BP Percentile --      Pulse Rate 03/29/24 1357 71     Resp 03/29/24 1357 18     Temp 03/29/24 1357 98.7 F (37.1 C)     Temp Source 03/29/24 1357 Oral     SpO2 03/29/24 1357 94 %     Weight --  Height --      Head Circumference --      Peak Flow --      Pain Score 03/29/24 1358 10     Pain Loc --      Pain Education --      Exclude from Growth Chart --    No data found.  Updated Vital Signs BP 126/83 (BP Location: Right Arm)   Pulse 71   Temp 98.7 F (37.1 C) (Oral)   Resp 18   LMP 02/19/2016   SpO2 94%   Visual Acuity Right Eye Distance:   Left Eye Distance:   Bilateral Distance:    Right Eye Near:   Left Eye Near:    Bilateral Near:     Physical Exam Vitals and nursing note reviewed.  Constitutional:      General: She is not in acute distress.    Appearance: Normal appearance.  HENT:      Head: Normocephalic.     Right Ear: Hearing, tympanic membrane, ear canal and external ear normal.     Left Ear: Hearing, tympanic membrane, ear canal and external ear normal.     Nose: Nose normal.     Mouth/Throat:     Lips: Pink.     Mouth: Mucous membranes are moist.     Dentition: Dental tenderness (Tooth #31) and gingival swelling present. No dental abscesses.  Eyes:     Extraocular Movements: Extraocular movements intact.     Pupils: Pupils are equal, round, and reactive to light.  Pulmonary:     Effort: Pulmonary effort is normal.  Musculoskeletal:     Cervical back: Normal range of motion.  Skin:    General: Skin is warm and dry.  Neurological:     General: No focal deficit present.     Mental Status: She is alert and oriented to person, place, and time.  Psychiatric:        Mood and Affect: Mood normal.        Behavior: Behavior normal.      UC Treatments / Results  Labs (all labs ordered are listed, but only abnormal results are displayed) Labs Reviewed - No data to display  EKG   Radiology No results found.  Procedures Procedures (including critical care time)  Medications Ordered in UC Medications  ketorolac (TORADOL) 30 MG/ML injection 30 mg (30 mg Intramuscular Given 03/29/24 1414)    Initial Impression / Assessment and Plan / UC Course  I have reviewed the triage vital signs and the nursing notes.  Pertinent labs & imaging results that were available during my care of the patient were reviewed by me and considered in my medical decision making (see chart for details).  No obvious abscess appreciated on exam.  Patient does have dental tenderness of tooth number #31.  Toradol 30 mg IM administered for pain.  Will treat with Augmentin 875/2025 mg twice daily for the next 7 days for possible dental infection.  Tramadol 50 mg twice daily for pain as needed.  Supportive care recommendations were provided and discussed with the patient to include warm salt  water gargles, warm compresses, and a soft diet.  Patient advised to follow-up with dentist as scheduled.  Patient was in agreement with this plan of care and verbalized understanding.  All questions were answered.  Patient stable for discharge.  Work note was provided.  Final Clinical Impressions(s) / UC Diagnoses   Final diagnoses:  Dentalgia     Discharge Instructions  You were given an injection of Toradol 30mg .  Take medication as prescribed. May take Tylenol 2 hours after Tramadol for breakthrough pain. Warm salt water gargles 3-4 times daily until symptoms improve. Continue warm compresses to the affected area to help with pain and discomfort. Follow-up with your dentist as scheduled.  Follow-up as needed.      ED Prescriptions     Medication Sig Dispense Auth. Provider   amoxicillin-clavulanate (AUGMENTIN) 875-125 MG tablet Take 1 tablet by mouth every 12 (twelve) hours. 14 tablet Leath-Warren, Sadie Haber, NP   traMADol (ULTRAM) 50 MG tablet Take 1 tablet (50 mg total) by mouth every 12 (twelve) hours as needed. 10 tablet Leath-Warren, Sadie Haber, NP      I have reviewed the PDMP during this encounter.   Abran Cantor, NP 03/29/24 [phone]

## 2024-03-29 NOTE — ED Triage Notes (Signed)
 Pt reports toothache on the right lower side, onset at 3 am. Pt has been taking tylneol for the pain and has found no relief.

## 2024-03-30 DIAGNOSIS — M5442 Lumbago with sciatica, left side: Secondary | ICD-10-CM | POA: Diagnosis not present

## 2024-03-30 DIAGNOSIS — Z79899 Other long term (current) drug therapy: Secondary | ICD-10-CM | POA: Diagnosis not present

## 2024-03-30 DIAGNOSIS — Z5181 Encounter for therapeutic drug level monitoring: Secondary | ICD-10-CM | POA: Diagnosis not present

## 2024-03-30 DIAGNOSIS — M5412 Radiculopathy, cervical region: Secondary | ICD-10-CM | POA: Diagnosis not present

## 2024-04-16 ENCOUNTER — Ambulatory Visit: Payer: 59 | Admitting: Neurology

## 2024-04-30 ENCOUNTER — Encounter: Payer: Self-pay | Admitting: Internal Medicine

## 2024-06-08 ENCOUNTER — Encounter (HOSPITAL_COMMUNITY): Payer: Self-pay | Admitting: Emergency Medicine

## 2024-06-08 ENCOUNTER — Ambulatory Visit: Payer: Self-pay

## 2024-06-08 ENCOUNTER — Emergency Department (HOSPITAL_COMMUNITY): Admission: EM | Admit: 2024-06-08 | Discharge: 2024-06-08 | Disposition: A | Discharge status: Home / Self Care

## 2024-06-08 ENCOUNTER — Other Ambulatory Visit: Payer: Self-pay

## 2024-06-08 DIAGNOSIS — D72819 Decreased white blood cell count, unspecified: Secondary | ICD-10-CM | POA: Insufficient documentation

## 2024-06-08 DIAGNOSIS — R519 Headache, unspecified: Secondary | ICD-10-CM | POA: Diagnosis present

## 2024-06-08 DIAGNOSIS — B349 Viral infection, unspecified: Secondary | ICD-10-CM | POA: Insufficient documentation

## 2024-06-08 DIAGNOSIS — Z79899 Other long term (current) drug therapy: Secondary | ICD-10-CM | POA: Diagnosis not present

## 2024-06-08 DIAGNOSIS — G43809 Other migraine, not intractable, without status migrainosus: Secondary | ICD-10-CM | POA: Insufficient documentation

## 2024-06-08 DIAGNOSIS — I10 Essential (primary) hypertension: Secondary | ICD-10-CM | POA: Insufficient documentation

## 2024-06-08 LAB — CBC WITH DIFFERENTIAL/PLATELET
Abs Immature Granulocytes: 0 10*3/uL (ref 0.00–0.07)
Basophils Absolute: 0 10*3/uL (ref 0.0–0.1)
Basophils Relative: 1 %
Eosinophils Absolute: 0.4 10*3/uL (ref 0.0–0.5)
Eosinophils Relative: 10 %
HCT: 37 % (ref 36.0–46.0)
Hemoglobin: 12.1 g/dL (ref 12.0–15.0)
Immature Granulocytes: 0 %
Lymphocytes Relative: 35 %
Lymphs Abs: 1.2 10*3/uL (ref 0.7–4.0)
MCH: 28.9 pg (ref 26.0–34.0)
MCHC: 32.7 g/dL (ref 30.0–36.0)
MCV: 88.5 fL (ref 80.0–100.0)
Monocytes Absolute: 0.2 10*3/uL (ref 0.1–1.0)
Monocytes Relative: 7 %
Neutro Abs: 1.7 10*3/uL (ref 1.7–7.7)
Neutrophils Relative %: 47 %
Platelets: 180 10*3/uL (ref 150–400)
RBC: 4.18 MIL/uL (ref 3.87–5.11)
RDW: 12.5 % (ref 11.5–15.5)
WBC: 3.5 10*3/uL — ABNORMAL LOW (ref 4.0–10.5)
nRBC: 0 % (ref 0.0–0.2)

## 2024-06-08 LAB — TROPONIN I (HIGH SENSITIVITY): Troponin I (High Sensitivity): <2 ng/L (ref <18)

## 2024-06-08 LAB — BASIC METABOLIC PANEL WITH GFR
Anion gap: 11 (ref 5–15)
BUN: 12 mg/dL (ref 6–20)
CO2: 26 mmol/L (ref 22–32)
Calcium: 9.2 mg/dL (ref 8.9–10.3)
Chloride: 101 mmol/L (ref 98–111)
Creatinine, Ser: 1.01 mg/dL — ABNORMAL HIGH (ref 0.44–1.00)
GFR, Estimated: >60 mL/min (ref >60)
Glucose, Bld: 84 mg/dL (ref 70–99)
Potassium: 3.8 mmol/L (ref 3.5–5.1)
Sodium: 138 mmol/L (ref 135–145)

## 2024-06-08 LAB — RESP PANEL BY RT-PCR (RSV, FLU A&B, COVID)  RVPGX2
Influenza A by PCR: NEGATIVE
Influenza B by PCR: NEGATIVE
Resp Syncytial Virus by PCR: NEGATIVE
SARS Coronavirus 2 by RT PCR: NEGATIVE

## 2024-06-08 MED ORDER — METOCLOPRAMIDE HCL 5 MG/ML IJ SOLN
10.0000 mg | Freq: Once | INTRAMUSCULAR | Status: AC
  Administered 2024-06-08: 10 mg via INTRAVENOUS
  Filled 2024-06-08: qty 2

## 2024-06-08 MED ORDER — BUTALBITAL-APAP-CAFFEINE 50-325-40 MG PO TABS
1.0000 | ORAL_TABLET | Freq: Once | ORAL | Status: AC
  Administered 2024-06-08: 1 via ORAL
  Filled 2024-06-08: qty 1

## 2024-06-08 MED ORDER — BUTALBITAL-APAP-CAFFEINE 50-325-40 MG PO TABS: 1.0000 | ORAL_TABLET | Freq: Four times a day (QID) | ORAL | 0 refills | Status: AC | PRN

## 2024-06-08 MED ORDER — DIPHENHYDRAMINE HCL 50 MG/ML IJ SOLN
25.0000 mg | Freq: Once | INTRAMUSCULAR | Status: AC
  Administered 2024-06-08: 25 mg via INTRAVENOUS
  Filled 2024-06-08: qty 1

## 2024-06-08 NOTE — Discharge Instructions (Addendum)
 You can take your Fioricet  as needed for headache.  Stay on your chlorthalidone  as prescribed.  Call your primary care doctor and make a follow-up appointment for the next 1 to 2 weeks.  Return to the ER if your symptoms worsen.

## 2024-06-08 NOTE — ED Provider Notes (Signed)
 [facility] AT [facility] Provider Note   CSN: [id number] Arrival date & time: 06/08/24  5621     Patient presents with: Migraine   Victoria Moore is a 48 y.o. female.   48 year old female presents for evaluation of headache.  States she has had a migraine for the last 3 days.  States she has been feeling nauseous as well as fatigued and had some left arm pain and chest pain occasionally as well.  Noticed elevated blood pressure last few days but blood pressure has been more so elevated today.  States she has been compliant with her chlorthalidone .  She states she has a history of migraines and she takes rizatriptan  at home and it did not help this time.  She denies any other symptoms or concerns at this time.   Migraine Associated symptoms include chest pain and headaches. Pertinent negatives include no abdominal pain and no shortness of breath.       Prior to Admission medications   Medication Sig Start Date End Date Taking? Authorizing Provider  butalbital -acetaminophen -caffeine  (FIORICET ) 50-325-40 MG tablet Take 1-2 tablets by mouth every 6 (six) hours as needed for up to 14 days for headache. 06/08/24 06/22/24 Yes Stefannie Defeo L, DO  acetaminophen  (TYLENOL ) 500 MG tablet Take 1,000 mg by mouth every 6 (six) hours as needed for moderate pain (pain score 4-6).    [provider]  albuterol  (PROVENTIL ) (2.5 MG/3ML) 0.083% nebulizer solution Inhale 3 mLs (2.5 mg total) via nebulizer every 6 (six) hours as needed for wheezing or shortness of breath. 01/24/23   Tobi Fortes, MD  albuterol  (VENTOLIN  HFA) 108 (90 Base) MCG/ACT inhaler Inhale 2 puffs into the lungs every 4 (four) hours as needed. 12/06/23   Corbin Dess, PA-C  amoxicillin -clavulanate (AUGMENTIN ) 875-125 MG tablet Take 1 tablet by mouth every 12 (twelve) hours. 03/29/24   Leath-Warren, Belen Bowers, NP  chlorthalidone  (HYGROTON ) 25 MG tablet Take 1 tablet (25 mg total) by  mouth daily. Pt needs to schedule appt. Pt can get a 90 day supply after office visit - 3rd and final attempt 08/22/23   Flo Hummingbird, PA-C  Cholecalciferol (VITAMIN D3) 1.25 MG (50000 UT) CAPS Take 1 capsule by mouth every Monday.    [provider]  fluticasone  (FLONASE ) 50 MCG/ACT nasal spray Place 1 spray into both nostrils 2 (two) times daily. 03/05/23   Corbin Dess, PA-C  QUEtiapine  (SEROQUEL ) 100 MG tablet Take 1 tablet (100 mg total) by mouth at bedtime. 06/28/23   Lillia Reilly, MD  SUMAtriptan  (IMITREX ) 20 MG/ACT nasal spray Place 1 spray (20 mg total) into the nose every 2 (two) hours as needed for migraine or headache. May repeat in 2 hours if headache persists or recurs. Maximum of 40 mg (2 sprays) in 24 hours. 02/23/23   Tobi Fortes, MD  tiZANidine  (ZANAFLEX ) 4 MG tablet Take 1 tablet (4 mg total) by mouth every 8 (eight) hours as needed for muscle spasms. Do not take with alcohol or while driving or operating heavy machinery.  May cause drowsiness. 03/05/24   Wilhemena Harbour, NP  traMADol  (ULTRAM ) 50 MG tablet Take 1 tablet (50 mg total) by mouth every 12 (twelve) hours as needed. 03/29/24   Leath-Warren, Belen Bowers, NP    Allergies: Aspirin and Nsaids    Review of Systems  Constitutional:  Positive for fatigue. Negative for chills and fever.  HENT:  Negative for ear pain and sore throat.  Eyes:  Negative for pain and visual disturbance.  Respiratory:  Negative for cough and shortness of breath.   Cardiovascular:  Positive for chest pain. Negative for palpitations.  Gastrointestinal:  Positive for nausea. Negative for abdominal pain and vomiting.  Genitourinary:  Negative for dysuria and hematuria.  Musculoskeletal:  Negative for arthralgias and back pain.  Skin:  Negative for color change and rash.  Neurological:  Positive for dizziness and headaches. Negative for seizures and syncope.  All other systems reviewed and are negative.   Updated Vital  Signs BP 126/80   Pulse (!) 55   Temp 98 F (36.7 C) (Oral)   Resp 16   Ht 5' 10 (1.778 m)   Wt 107.5 kg   LMP 02/19/2016   SpO2 97%   BMI 34.01 kg/m   Physical Exam Vitals and nursing note reviewed.  Constitutional:      General: She is not in acute distress.    Appearance: Normal appearance. She is well-developed. She is not ill-appearing.  HENT:     Head: Normocephalic and atraumatic.   Eyes:     Conjunctiva/sclera: Conjunctivae normal.    Cardiovascular:     Rate and Rhythm: Normal rate and regular rhythm.     Heart sounds: Normal heart sounds. No murmur heard. Pulmonary:     Effort: Pulmonary effort is normal. No respiratory distress.     Breath sounds: Normal breath sounds. No stridor. No wheezing or rhonchi.  Abdominal:     General: There is no distension.     Palpations: Abdomen is soft. There is no mass.     Tenderness: There is no abdominal tenderness.     Hernia: No hernia is present.   Musculoskeletal:        General: No swelling.     Cervical back: Neck supple.   Skin:    General: Skin is warm and dry.     Capillary Refill: Capillary refill takes less than 2 seconds.   Neurological:     General: No focal deficit present.     Mental Status: She is alert.   Psychiatric:        Mood and Affect: Mood normal.     (all labs ordered are listed, but only abnormal results are displayed) Labs Reviewed  CBC WITH DIFFERENTIAL/PLATELET - Abnormal; Notable for the following components:      Result Value   WBC 3.5 (*)    All other components within normal limits  BASIC METABOLIC PANEL WITH GFR - Abnormal; Notable for the following components:   Creatinine, Ser 1.01 (*)    All other components within normal limits  RESP PANEL BY RT-PCR (RSV, FLU A&B, COVID)  [id number]  TROPONIN I (HIGH SENSITIVITY)    EKG: EKG Interpretation Date/Time:  Friday June 08 2024 09:00:57 EDT Ventricular Rate:  57 PR Interval:  148 QRS Duration:  93 QT Interval:  498 QTC  Calculation: 485 R Axis:   69  Text Interpretation: Sinus rhythm Borderline T abnormalities, anterior leads normal axis, normal intervals, no STEMI. No acute change when compared to prior EKG from 01/17/2024 Confirmed by Lorriane Rote ([id number]) on 06/08/2024 9:23:56 AM  Radiology: No results found.   Procedures   Medications Ordered in the ED  diphenhydrAMINE  (BENADRYL ) injection 25 mg (25 mg Intravenous Given 06/08/24 0950)  metoCLOPramide  (REGLAN ) injection 10 mg (10 mg Intravenous Given 06/08/24 0949)  butalbital -acetaminophen -caffeine  (FIORICET ) 50-325-40 MG per tablet 1 tablet (1 tablet Oral Given 06/08/24 0948)  Medical Decision Making Medical Decision Making Nursing notes are reviewed. Differential diagnosis for this patient would include but not limited to: Migraine headache, tension headache, cluster headache, hypertensive urgency, viral syndrome, other  Records reviewed: Prior outpatient records reviewed and patient was seen on 03/29/2024 at urgent care for dental pain   EKG interpretation: interpreted by me in the absence of cardiology and shows sinus rhythm, normal axis, normal intervals, no STEMI or acute change when compared to previous EKG from April 2025  Cardiac monitor interpretation: sinus rhythm, no ectopy  Emergency Department Course:  Vital signs and pulse oximetry are reviewed, evaluated by myself and found to be within normal limits prior to final disposition. Findings of laboratory testing and medical imaging are discussed with patient and family that is available. Patient agrees with the medical care plan as follows:  Patient's lab workup reviewed by me and unremarkable except for some mild leukopenia.  Negative for COVID flu and RSV.  Initially her blood pressure was elevated but after treatment with migraine cocktail blood pressures improved and her headache is much improved.  I will give her prescription for Fioricet  to use  as needed and she is advised Tylenol  as needed for pain as well.  Advised to stay on her at home medications for hypertension and arrange close follow-up with her primary care doctor in 1 to 2 weeks.  Advise return to the ER for new or worsening symptoms.  She feels comfortable with the plan to be discharged home.  Problems Addressed: Hypertension, unspecified type: chronic illness or injury with exacerbation, progression, or side effects of treatment Other migraine without status migrainosus, not intractable: acute illness or injury Viral syndrome: self-limited or minor problem  Amount and/or Complexity of Data Reviewed External Data Reviewed: notes. Labs: ordered. Decision-making details documented in ED Course. ECG/medicine tests: ordered and independent interpretation performed. Decision-making details documented in ED Course.  Risk OTC drugs. Prescription drug management. Drug therapy requiring intensive monitoring for toxicity.     Final diagnoses:  Other migraine without status migrainosus, not intractable  Hypertension, unspecified type  Viral syndrome    ED Discharge Orders          Ordered    butalbital -acetaminophen -caffeine  (FIORICET ) 50-325-40 MG tablet  Every 6 hours PRN        06/08/24 1042               Khristian Phillippi, [place] L, DO 06/08/24 1112

## 2024-06-08 NOTE — ED Triage Notes (Signed)
 Pt c/o of a migraine, dizzy spells and cold chills x3 days.

## 2024-06-11 ENCOUNTER — Ambulatory Visit
Admission: RE | Admit: 2024-06-11 | Discharge: 2024-06-11 | Disposition: A | Discharge status: Home / Self Care | Source: Ambulatory Visit | Attending: Nurse Practitioner | Admitting: Nurse Practitioner

## 2024-06-11 VITALS — BP 115/67 | HR 91 | Temp 98.3°F | Resp 18

## 2024-06-11 DIAGNOSIS — G43911 Migraine, unspecified, intractable, with status migrainosus: Secondary | ICD-10-CM

## 2024-06-11 DIAGNOSIS — Z8669 Personal history of other diseases of the nervous system and sense organs: Secondary | ICD-10-CM

## 2024-06-11 MED ORDER — DEXAMETHASONE SODIUM PHOSPHATE 10 MG/ML IJ SOLN
10.0000 mg | Freq: Once | INTRAMUSCULAR | Status: AC
Start: 2024-06-11 — End: 2024-06-11
  Administered 2024-06-11: 10 mg via INTRAMUSCULAR

## 2024-06-11 MED ORDER — SUMATRIPTAN SUCCINATE 6 MG/0.5ML ~~LOC~~ SOLN
6.0000 mg | Freq: Once | SUBCUTANEOUS | Status: AC
Start: 2024-06-11 — End: 2024-06-11
  Administered 2024-06-11: 6 mg via SUBCUTANEOUS

## 2024-06-11 NOTE — Discharge Instructions (Addendum)
 You were given injections of Decadron  and Imitrex  today.  Continue with the Fioricet  previously prescribed by the emergency department. Increase fluids and allow for plenty of rest. You may take over-the-counter Tylenol  or ibuprofen  as needed for pain or discomfort. Apply cool compresses to the forehead to help with headache pain or discomfort. Recommend sleeping in a dimly lit room and avoiding loud noises or sounds while symptoms persist. Recommend keeping a headache diary for you to take with you to your neurologist appointment. Go to the emergency department immediately if you experience worsening headache, worsening dizziness, visual changes, or if symptoms fail to improve. Follow-up with neurology as scheduled. Follow-up as needed.

## 2024-06-11 NOTE — ED Provider Notes (Signed)
 [facility]    CSN: [id number] Arrival date & time: 06/11/24  1200      History   Chief Complaint Chief Complaint  Patient presents with   Migraine    Migraine all on right side... vomiting - Entered by patient    HPI Victoria Moore is a 48 y.o. female.   The history is provided by the patient.   Patient presents for complaints of a migraine headache since 06/04/2024.  Patient was seen in the emergency department on 06/08/2024.  She was treated with Fioricet , Benadryl , and Reglan .  She states she was discharged home with Fioricet  for pain, states medication is not helping.  Pain is located on the right side of her head.  She endorses light and sound sensitivity, nausea, vomiting, and intermittent dizziness.  Patient denies fever, chills, lower extremity weakness, loss of vision, chest pain, abdominal pain, diarrhea, or constipation.  Patient with underlying history of migraines, states she is scheduled to see neurology, but the appointment is not anytime soon.  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 Fibromyalgia, doubt sarcoid    Asthma    Blood transfusion without reported diagnosis    Chest wall pain 05/26/2018   Chronic kidney disease    Chronic low back pain with sciatica 09/15/2018   Common migraine with intractable migraine 10/07/2016   Cough variant asthma vs UACS  10/23/2016   - Spirometry 04/15/2016  Very truncated exp loop effort dep portion only  - Allergy  profile 10/22/2016 >  Eos 0.2/  IgE  52 RAST POS grass/trees/ragweed  10/22/2016  After extensive coaching HFA effectiveness =    75% try duelra 100 2bid > improved   Daytime sleepiness 05/26/2018   Decreased pedal pulses 10/04/2018   Depression    Depression, major, single episode, moderate (HCC) 05/26/2018    Diabetes (HCC)    Dyspnea 04/15/2016   04/15/2016  Walked RA x 3 laps @ 185 ft each stopped due to  End of study, nl pace, no sob or desat    - Spirometry 04/15/2016  Very truncated exp loop effort dep portion only  - 10/22/2016  Walked RA x 3 laps @ 185 ft each stopped due to  End of study, slow pace, min sob/ no desat - full pfts rec 10/22/2016 >>>       Edema    Essential hypertension, benign 11/15/2016   Fibroids 03/11/2016   Frequency of urination 09/11/2018   GERD (gastroesophageal reflux disease)    Herniated lumbar intervertebral disc    Hiatal hernia    Hilar adenopathy    Hypertension    Hypokalemia 06/28/2018   Impaired fasting blood sugar 09/11/2018   Iron deficiency    Leg pain 12/07/2018   Low libido 11/27/2018   Migraine    Mild sleep apnea 08/03/2018   HST 06/20/18  AHI  8.1 / snoring with 02 nadir 80% >  08/03/2018 rec sleep medicine consultation    Morbid (severe) obesity due to excess calories (HCC) 10/23/2016   Personal history of sarcoidosis 05/26/2018   Prolonged capillary refill time    Right leg swelling 08/18/2018   Sarcoidosis    personal history of   Seizures (HCC)    history  of    Sleep apnea    Spondylosis    Vitamin D  deficiency     Patient Active Problem List   Diagnosis Date Noted   Peripheral neuropathy 10/12/2022   MDD (major depressive disorder) 10/12/2022   History of epilepsy 09/22/2022   Asthmatic bronchitis 01/23/2021   Social anxiety disorder 10/03/2020   Attention deficit hyperactivity disorder (ADHD), 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   S/P gastric bypass 05/12/2020   Intractable abdominal pain 04/27/2020   Benign intracranial hypertension 02/14/2020   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   Complex cyst of left ovary 02/01/2019   Sarcoidosis 02/01/2019   Leg pain 12/07/2018   Low libido 11/27/2018   Decreased pedal pulses 10/04/2018   Chronic low back pain with  sciatica 09/15/2018   Impaired fasting blood sugar 09/11/2018   Mild sleep apnea 08/03/2018   Hypokalemia 06/28/2018   Medication side effect 06/22/2018   Vitamin D  deficiency 05/26/2018   Encounter for health maintenance examination with abnormal findings 05/26/2018   Depression, major, single episode, moderate (HCC) 05/26/2018   Daytime sleepiness 05/26/2018   GERD (gastroesophageal reflux disease) 05/26/2018   Essential hypertension, benign 11/15/2016   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/10/2016   Class 1 obesity with body mass index (BMI) of 32.0 to 32.9 in adult 10/23/2016   Common migraine with intractable migraine 10/07/2016   DOE (dyspnea on exertion) 04/15/2016   Seizures (HCC) 03/11/2016   Anemia 11/12/2014   Iron deficiency 11/12/2014    Past Surgical History:  Procedure Laterality Date   ABDOMINAL HYSTERECTOMY     ESOPHAGEAL MANOMETRY N/A 09/05/2019   Procedure: ESOPHAGEAL MANOMETRY (EM);  Surgeon: Annis Kinder, DO;  Location: [facility];  Service: Gastroenterology;  Laterality: N/A;   LAPAROSCOPIC OVARIAN CYSTECTOMY Left 03/11/2016   Procedure: LAPAROSCOPIC OVARIAN CYSTECTOMY;  Surgeon: Stevenson Elbe, MD;  Location: [facility];  Service: Gynecology;  Laterality: Left;   LAPAROSCOPIC VAGINAL HYSTERECTOMY WITH SALPINGECTOMY Bilateral 03/11/2016   Procedure: LAPAROSCOPIC ASSISTED VAGINAL HYSTERECTOMY WITH SALPINGECTOMY;  Surgeon: Stevenson Elbe, MD;  Location: [facility];  Service: Gynecology;  Laterality: Bilateral;   PH IMPEDANCE STUDY  09/05/2019   Procedure: PH IMPEDANCE STUDY;  Surgeon: Annis Kinder, DO;  Location: [facility];  Service: Gastroenterology;;   TUBAL LIGATION     UPPER GASTROINTESTINAL ENDOSCOPY  10/11/2019   06/2019    OB History     Gravida  1   Para      Term      Preterm      AB      Living         SAB      IAB      Ectopic      Multiple      Live Births               Home Medications    Prior to Admission  medications   Medication Sig Start Date End Date Taking? Authorizing Provider  acetaminophen  (TYLENOL ) 500 MG tablet Take 1,000 mg by mouth every 6 (six) hours as needed for moderate pain (pain score 4-6).    [provider]  albuterol  (PROVENTIL ) (2.5 MG/3ML) 0.083% nebulizer solution Inhale 3 mLs (2.5 mg total) via nebulizer every 6 (six) hours as needed for wheezing or shortness of breath. 01/24/23   Tobi Fortes, MD  albuterol  (VENTOLIN  HFA) 108 (90 Base) MCG/ACT inhaler Inhale 2 puffs into the lungs every  4 (four) hours as needed. 12/06/23   Corbin Dess, PA-C  amoxicillin -clavulanate (AUGMENTIN ) 875-125 MG tablet Take 1 tablet by mouth every 12 (twelve) hours. 03/29/24   Leath-Warren, Belen Bowers, NP  butalbital -acetaminophen -caffeine  (FIORICET ) 50-325-40 MG tablet Take 1-2 tablets by mouth every 6 (six) hours as needed for up to 14 days for headache. 06/08/24 06/22/24  Kammerer, Jacqlyn Matas L, DO  chlorthalidone  (HYGROTON ) 25 MG tablet Take 1 tablet (25 mg total) by mouth daily. Pt needs to schedule appt. Pt can get a 90 day supply after office visit - 3rd and final attempt 08/22/23   Flo Hummingbird, PA-C  Cholecalciferol (VITAMIN D3) 1.25 MG (50000 UT) CAPS Take 1 capsule by mouth every Monday.    [provider]  fluticasone  (FLONASE ) 50 MCG/ACT nasal spray Place 1 spray into both nostrils 2 (two) times daily. 03/05/23   Corbin Dess, PA-C  QUEtiapine  (SEROQUEL ) 100 MG tablet Take 1 tablet (100 mg total) by mouth at bedtime. 06/28/23   Lillia Reilly, MD  SUMAtriptan  (IMITREX ) 20 MG/ACT nasal spray Place 1 spray (20 mg total) into the nose every 2 (two) hours as needed for migraine or headache. May repeat in 2 hours if headache persists or recurs. Maximum of 40 mg (2 sprays) in 24 hours. 02/23/23   Tobi Fortes, MD  tiZANidine  (ZANAFLEX ) 4 MG tablet Take 1 tablet (4 mg total) by mouth every 8 (eight) hours as needed for muscle spasms. Do not take with alcohol or  while driving or operating heavy machinery.  May cause drowsiness. 03/05/24   Wilhemena Harbour, NP  traMADol  (ULTRAM ) 50 MG tablet Take 1 tablet (50 mg total) by mouth every 12 (twelve) hours as needed. 03/29/24   Leath-Warren, Belen Bowers, NP    Family History Family History  Adopted: Yes  Problem Relation Age of Onset   Asthma Mother    Heart Problems Mother    Migraines Mother    COPD Mother    Heart attack Mother    Diabetes Father    Peptic Ulcer Father    Heart Problems Father    COPD Father    Heart attack Father    Dementia Maternal Grandmother    Heart Problems Maternal Grandfather    Heart attack Maternal Grandfather    Diabetes Paternal Grandmother    Diabetes Paternal Grandfather    Other Son        Growing pains   Colon cancer Neg Hx    Colon polyps Neg Hx    Esophageal cancer Neg Hx    Rectal cancer Neg Hx    Stomach cancer Neg Hx     Social History Social History   Tobacco Use   Smoking status: Former    Current packs/day: 0.00    Average packs/day: 0.3 packs/day for 17.0 years (4.3 ttl pk-yrs)    Types: Cigarettes    Start date: 03/02/1996    Quit date: 03/02/2013    Years since quitting: 11.2   Smokeless tobacco: Never  Vaping Use   Vaping status: Never Used  Substance Use Topics   Alcohol use: Yes    Comment: occasional   Drug use: Not Currently    Types: Marijuana    Comment: former - 6 yrs ago     Allergies   Aspirin and Nsaids   Review of Systems Review of Systems Per HPI  Physical Exam Triage Vital Signs ED Triage Vitals  Encounter Vitals Group     BP 06/11/24 1209  115/67     Girls Systolic BP Percentile --      Girls Diastolic BP Percentile --      Boys Systolic BP Percentile --      Boys Diastolic BP Percentile --      Pulse Rate 06/11/24 1209 91     Resp 06/11/24 1209 18     Temp 06/11/24 1209 98.3 F (36.8 C)     Temp Source 06/11/24 1209 Oral     SpO2 06/11/24 1209 97 %     Weight --      Height --      Head  Circumference --      Peak Flow --      Pain Score 06/11/24 1210 8     Pain Loc --      Pain Education --      Exclude from Growth Chart --    No data found.  Updated Vital Signs BP 115/67 (BP Location: Right Arm)   Pulse 91   Temp 98.3 F (36.8 C) (Oral)   Resp 18   LMP 02/19/2016   SpO2 97%   Visual Acuity Right Eye Distance:   Left Eye Distance:   Bilateral Distance:    Right Eye Near:   Left Eye Near:    Bilateral Near:     Physical Exam Vitals and nursing note reviewed.  Constitutional:      General: She is not in acute distress.    Appearance: Normal appearance.  HENT:     Head: Normocephalic.     Right Ear: Tympanic membrane, ear canal and external ear normal.     Left Ear: Tympanic membrane, ear canal and external ear normal.     Nose: Nose normal.     Mouth/Throat:     Mouth: Mucous membranes are moist.   Eyes:     Extraocular Movements: Extraocular movements intact.     Conjunctiva/sclera: Conjunctivae normal.     Pupils: Pupils are equal, round, and reactive to light.    Cardiovascular:     Rate and Rhythm: Normal rate and regular rhythm.     Pulses: Normal pulses.     Heart sounds: Normal heart sounds.  Pulmonary:     Effort: Pulmonary effort is normal. No respiratory distress.     Breath sounds: Normal breath sounds. No stridor. No wheezing, rhonchi or rales.  Abdominal:     Palpations: Abdomen is soft.     Tenderness: There is no abdominal tenderness.   Musculoskeletal:     Cervical back: Normal range of motion.   Skin:    General: Skin is warm and dry.   Neurological:     General: No focal deficit present.     Mental Status: She is alert and oriented to person, place, and time.     GCS: GCS eye subscore is 4. GCS verbal subscore is 5. GCS motor subscore is 6.     Cranial Nerves: Cranial nerves 2-12 are intact.     Sensory: Sensation is intact.     Motor: Motor function is intact.     Coordination: Coordination is intact.     Gait:  Gait is intact.   Psychiatric:        Mood and Affect: Mood normal.        Behavior: Behavior normal.      UC Treatments / Results  Labs (all labs ordered are listed, but only abnormal results are displayed) Labs Reviewed - No data to display  EKG  Radiology No results found.  Procedures Procedures (including critical care time)  Medications Ordered in UC Medications - No data to display  Initial Impression / Assessment and Plan / UC Course  I have reviewed the triage vital signs and the nursing notes.  Pertinent labs & imaging results that were available during my care of the patient were reviewed by me and considered in my medical decision making (see chart for details).  Decadron  10 mg IM and Imitrex  6 mg [place] administered.  Patient advised to continue Fioricet  previously prescribed by the emergency department.  Supportive care recommendations were provided and discussed with the patient to include over-the-counter analgesics, increasing fluids, allowing for plenty of rest, and keeping a headache diary for her to take with her to the neurologist appointment.  Patient was given strict ER follow-up precautions.  Patient was in agreement with this plan of care and verbalizes understanding.  All questions were answered.  Patient stable for discharge.  Work note was provided.  Final Clinical Impressions(s) / UC Diagnoses   Final diagnoses:  None   Discharge Instructions   None    ED Prescriptions   None    PDMP not reviewed this encounter.   Hardy Lia, NP 06/11/24 1233

## 2024-06-11 NOTE — ED Triage Notes (Signed)
 Pt reports she has been been having a migraine x 3 days states she cannot sleep because of it

## 2024-06-13 ENCOUNTER — Emergency Department (HOSPITAL_COMMUNITY)

## 2024-06-13 ENCOUNTER — Emergency Department (HOSPITAL_COMMUNITY): Admission: EM | Admit: 2024-06-13 | Discharge: 2024-06-13 | Disposition: A | Discharge status: Home / Self Care

## 2024-06-13 ENCOUNTER — Other Ambulatory Visit: Payer: Self-pay

## 2024-06-13 ENCOUNTER — Encounter (HOSPITAL_COMMUNITY): Payer: Self-pay

## 2024-06-13 DIAGNOSIS — G43909 Migraine, unspecified, not intractable, without status migrainosus: Secondary | ICD-10-CM | POA: Diagnosis not present

## 2024-06-13 DIAGNOSIS — R519 Headache, unspecified: Secondary | ICD-10-CM | POA: Diagnosis present

## 2024-06-13 LAB — CBC WITH DIFFERENTIAL/PLATELET
Abs Immature Granulocytes: 0.01 10*3/uL (ref 0.00–0.07)
Basophils Absolute: 0 10*3/uL (ref 0.0–0.1)
Basophils Relative: 0 %
Eosinophils Absolute: 0.3 10*3/uL (ref 0.0–0.5)
Eosinophils Relative: 7 %
HCT: 35.6 % — ABNORMAL LOW (ref 36.0–46.0)
Hemoglobin: 11 g/dL — ABNORMAL LOW (ref 12.0–15.0)
Immature Granulocytes: 0 %
Lymphocytes Relative: 38 %
Lymphs Abs: 1.8 10*3/uL (ref 0.7–4.0)
MCH: 27.8 pg (ref 26.0–34.0)
MCHC: 30.9 g/dL (ref 30.0–36.0)
MCV: 89.9 fL (ref 80.0–100.0)
Monocytes Absolute: 0.3 10*3/uL (ref 0.1–1.0)
Monocytes Relative: 6 %
Neutro Abs: 2.4 10*3/uL (ref 1.7–7.7)
Neutrophils Relative %: 49 %
Platelets: 171 10*3/uL (ref 150–400)
RBC: 3.96 MIL/uL (ref 3.87–5.11)
RDW: 12.7 % (ref 11.5–15.5)
WBC: 4.9 10*3/uL (ref 4.0–10.5)
nRBC: 0 % (ref 0.0–0.2)

## 2024-06-13 LAB — COMPREHENSIVE METABOLIC PANEL WITH GFR
ALT: 12 U/L (ref 0–44)
AST: 18 U/L (ref 15–41)
Albumin: 3.6 g/dL (ref 3.5–5.0)
Alkaline Phosphatase: 69 U/L (ref 38–126)
Anion gap: 9 (ref 5–15)
BUN: 14 mg/dL (ref 6–20)
CO2: 25 mmol/L (ref 22–32)
Calcium: 8.9 mg/dL (ref 8.9–10.3)
Chloride: 106 mmol/L (ref 98–111)
Creatinine, Ser: 1 mg/dL (ref 0.44–1.00)
GFR, Estimated: >60 mL/min (ref >60)
Glucose, Bld: 88 mg/dL (ref 70–99)
Potassium: 3.7 mmol/L (ref 3.5–5.1)
Sodium: 140 mmol/L (ref 135–145)
Total Bilirubin: 0.3 mg/dL (ref 0.0–1.2)
Total Protein: 6.8 g/dL (ref 6.5–8.1)

## 2024-06-13 MED ORDER — PROCHLORPERAZINE EDISYLATE 10 MG/2ML IJ SOLN
10.0000 mg | Freq: Once | INTRAMUSCULAR | Status: AC
  Administered 2024-06-13: 10 mg via INTRAVENOUS
  Filled 2024-06-13: qty 2

## 2024-06-13 MED ORDER — DIPHENHYDRAMINE HCL 50 MG/ML IJ SOLN
12.5000 mg | Freq: Once | INTRAMUSCULAR | Status: AC
  Administered 2024-06-13: 12.5 mg via INTRAVENOUS
  Filled 2024-06-13: qty 1

## 2024-06-13 MED ORDER — SODIUM CHLORIDE 0.9 % IV BOLUS
1000.0000 mL | Freq: Once | INTRAVENOUS | Status: AC
  Administered 2024-06-13: 1000 mL via INTRAVENOUS

## 2024-06-13 MED ORDER — DEXAMETHASONE SODIUM PHOSPHATE 10 MG/ML IJ SOLN
10.0000 mg | Freq: Once | INTRAMUSCULAR | Status: AC
  Administered 2024-06-13: 10 mg via INTRAMUSCULAR
  Filled 2024-06-13: qty 1

## 2024-06-13 NOTE — Discharge Instructions (Addendum)
 As we discussed, please follow-up with neurology.  We have any kind of focal symptoms such as vision changes, acute numbness or tingling anywhere, difficulty speaking then please come back to the ED further evaluation.

## 2024-06-13 NOTE — ED Provider Notes (Addendum)
 [place] [facility] AT [facility] Provider Note   CSN: [id number] Arrival date & time: 06/13/24  4098     Patient presents with: Migraine   Victoria Moore is a 48 y.o. female.    Migraine Pertinent negatives include no chest pain, no abdominal pain and no shortness of breath.   Presents because of migraine.  Patient states has been having a migraine pretty much consistently for over the past 5 to 6 days.  Patient states that she has a history of migraines.  Has been taking her home medication without any kind of obvious relief.  She states its somewhat consistent with her previous migraines given the throbbing nature.  Has not some light sensitivity which is her baseline for migraines.  No fever no chills.  No pain in the back or neck with neck flexion.  Numbness or tingling aware.  He denies any chest pain shortness of breath.  No vision changes.  No blurriness of vision.  No diplopia.   Previous medical history reviewed : Patient was seen on June 16 because of migraine.  Was given Decadron  IM and Imitrex .  She was also seen on June 13 because of migraine.  Negative workup.  History of migraines.     Prior to Admission medications   Medication Sig Start Date End Date Taking? Authorizing Provider  acetaminophen  (TYLENOL ) 500 MG tablet Take 1,000 mg by mouth every 6 (six) hours as needed for moderate pain (pain score 4-6).   Yes [provider]  albuterol  (PROVENTIL ) (2.5 MG/3ML) 0.083% nebulizer solution Inhale 3 mLs (2.5 mg total) via nebulizer every 6 (six) hours as needed for wheezing or shortness of breath. 01/24/23  Yes Tobi Fortes, MD  albuterol  (VENTOLIN  HFA) 108 (90 Base) MCG/ACT inhaler Inhale 2 puffs into the lungs every 4 (four) hours as needed. 12/06/23  Yes Corbin Dess, PA-C  butalbital -acetaminophen -caffeine  (FIORICET ) 50-325-40 MG tablet Take 1-2 tablets by mouth every 6 (six) hours as needed for up to 14 days for headache.  06/08/24 06/22/24 Yes Kammerer, Megan L, DO  chlorthalidone  (HYGROTON ) 25 MG tablet Take 1 tablet (25 mg total) by mouth daily. Pt needs to schedule appt. Pt can get a 90 day supply after office visit - 3rd and final attempt 08/22/23  Yes Flo Hummingbird, PA-C  fluticasone  (FLONASE ) 50 MCG/ACT nasal spray Place 1 spray into both nostrils 2 (two) times daily. 03/05/23  Yes Corbin Dess, PA-C  ondansetron  (ZOFRAN -ODT) 4 MG disintegrating tablet Take 4 mg by mouth every 8 (eight) hours as needed. 01/28/24  Yes [provider]  pantoprazole  (PROTONIX ) 40 MG tablet Take 40 mg by mouth daily. 01/28/24 01/27/25 Yes [provider]  QUEtiapine  (SEROQUEL ) 100 MG tablet Take 1 tablet (100 mg total) by mouth at bedtime. 06/28/23  Yes Lillia Reilly, MD  tiZANidine  (ZANAFLEX ) 4 MG tablet Take 1 tablet (4 mg total) by mouth every 8 (eight) hours as needed for muscle spasms. Do not take with alcohol or while driving or operating heavy machinery.  May cause drowsiness. 03/05/24  Yes Wilhemena Harbour, NP    Allergies: Aspirin and Nsaids    Review of Systems  Constitutional:  Negative for chills and fever.  HENT:  Negative for ear pain and sore throat.   Eyes:  Negative for pain and visual disturbance.  Respiratory:  Negative for cough and shortness of breath.   Cardiovascular:  Negative for chest pain and palpitations.  Gastrointestinal:  Negative for abdominal  pain and vomiting.  Genitourinary:  Negative for dysuria and hematuria.  Musculoskeletal:  Negative for arthralgias and back pain.  Skin:  Negative for color change and rash.  Neurological:  Negative for seizures and syncope.  All other systems reviewed and are negative.   Updated Vital Signs BP (!) 120/98 (BP Location: Left Arm)   Pulse (!) 53   Temp (!) 97.2 F (36.2 C) (Temporal)   Resp 16   Ht 5' 10 (1.778 m)   Wt 107.5 kg   LMP 02/19/2016   SpO2 96%   BMI 34.01 kg/m   Physical Exam Vitals and nursing note  reviewed.  Constitutional:      General: She is not in acute distress.    Appearance: She is well-developed.  HENT:     Head: Normocephalic and atraumatic.   Eyes:     General: No visual field deficit.    Conjunctiva/sclera: Conjunctivae normal.    Cardiovascular:     Rate and Rhythm: Normal rate and regular rhythm.     Heart sounds: No murmur heard. Pulmonary:     Effort: Pulmonary effort is normal. No respiratory distress.     Breath sounds: Normal breath sounds.  Abdominal:     Palpations: Abdomen is soft.     Tenderness: There is no abdominal tenderness.   Musculoskeletal:        General: No swelling.     Cervical back: Neck supple.   Skin:    General: Skin is warm and dry.     Capillary Refill: Capillary refill takes less than 2 seconds.   Neurological:     Mental Status: She is alert and oriented to person, place, and time.     GCS: GCS eye subscore is 4. GCS verbal subscore is 5. GCS motor subscore is 6.     Cranial Nerves: Cranial nerves 2-12 are intact. No cranial nerve deficit, dysarthria or facial asymmetry.     Sensory: Sensation is intact.     Motor: Motor function is intact. No weakness or pronator drift.     Coordination: Coordination is intact. Romberg sign negative.     Gait: Gait is intact. Gait normal.   Psychiatric:        Mood and Affect: Mood normal.     (all labs ordered are listed, but only abnormal results are displayed) Labs Reviewed  CBC WITH DIFFERENTIAL/PLATELET - Abnormal; Notable for the following components:      Result Value   Hemoglobin 11.0 (*)    HCT 35.6 (*)    All other components within normal limits  COMPREHENSIVE METABOLIC PANEL WITH GFR    EKG: EKG Interpretation Date/Time:  Wednesday June 13 2024 14:19:50 EDT Ventricular Rate:  70 PR Interval:  148 QRS Duration:  100 QT Interval:  407 QTC Calculation: 440 R Axis:   55  Text Interpretation: Sinus rhythm Low voltage, precordial leads Borderline T abnormalities,  anterior leads Confirmed by Faustino Hook [phone]) on 06/13/2024 2:22:37 PM  Radiology: CT Head Wo Contrast Result Date: 06/13/2024 CLINICAL DATA:  Migraine, evaluation for intracranial hemorrhage. EXAM: CT HEAD WITHOUT CONTRAST TECHNIQUE: Contiguous axial images were obtained from the base of the skull through the vertex without intravenous contrast. RADIATION DOSE REDUCTION: This exam was performed according to the departmental dose-optimization program which includes automated exposure control, adjustment of the mA and/or kV according to patient size and/or use of iterative reconstruction technique. COMPARISON:  CT head 01/17/2024. FINDINGS: Brain: No acute intracranial hemorrhage. No CT evidence of  acute infarct. No edema, mass effect, or midline shift. The basilar cisterns are patent. Ventricles: The ventricles are normal. Vascular: No hyperdense vessel or unexpected calcification. Skull: No acute or aggressive finding. Orbits: Orbits are symmetric. Sinuses: The visualized paranasal sinuses are clear. Other: Mastoid air cells are clear. IMPRESSION: No CT evidence of acute intracranial abnormality. Electronically Signed   By: Denny Flack M.D.   On: 06/13/2024 11:24     .Ultrasound ED Peripheral IV (Provider)  Date/Time: 06/13/2024 12:43 PM  Performed by: Spero Dye, MD Authorized by: Spero Dye, MD   Procedure details:    Indications: multiple failed IV attempts and poor IV access     Skin Prep: prepped with other     Location:  Right AC   Angiocath:  20 G   Bedside Ultrasound Guided: Yes     Images: not archived     Patient tolerated procedure without complications: Yes     Dressing applied: Yes      Medications Ordered in the ED  prochlorperazine  (COMPAZINE ) injection 10 mg (10 mg Intravenous Given 06/13/24 1257)  diphenhydrAMINE  (BENADRYL ) injection 12.5 mg (12.5 mg Intravenous Given 06/13/24 1301)  sodium chloride  0.9 % bolus 1,000 mL ( Intravenous Stopped 06/13/24 1326)   dexamethasone  (DECADRON ) injection 10 mg (10 mg Intramuscular Given 06/13/24 1351)                                    Medical Decision Making Amount and/or Complexity of Data Reviewed Labs: ordered. Radiology: ordered.  Risk Prescription drug management.   Presents because of migraine.  Patient states has been having a migraine pretty much consistently for over the past 5 to 6 days.  Patient states that she has a history of migraines.  Has been taking her home medication without any kind of obvious relief.  She states its somewhat consistent with her previous migraines given the throbbing nature.  Has not some light sensitivity which is her baseline for migraines.  No fever no chills.  No pain in the back or neck with neck flexion.  Numbness or tingling aware.  He denies any chest pain shortness of breath.  No vision changes.  No blurriness of vision.  No diplopia.   Previous medical history reviewed : Patient was seen on June 16 because of migraine.  Was given Decadron  IM and Imitrex .  She was also seen on June 13 because of migraine.  Negative workup.  History of migraines.   Upon exam, patient hemodynamically stable.  ANO x 3 GCS 15.  No signs of any meningitis.  Negative Kernig's and Brudzinski's.  No Pain with neck flexion. No indication for LP at this point in time.  No history of hypercoagulation.  Low concerns for any kind of cerebral venous thrombosis.  I did obtain a CT scan dry.  No space-occupying lesion was seen.  She was given Compazine , Benadryl , Decadron  as well as approximately 300 cc of fluid.  Had ordered a liter but her line unfortunately infiltrated.  Symptoms improved.  Patient remained ANO x 3 GCS 15.  Cranials 2 through 12 intact.   Discharged in stable condition.      Final diagnoses:  Migraine without status migrainosus, not intractable, unspecified migraine type    ED Discharge Orders     None          Spero Dye, MD 06/13/24 1428  Spero Dye, MD 06/13/24 [phone]

## 2024-06-13 NOTE — ED Triage Notes (Signed)
 Pt arrived via POV c/o recurrent migraine. Pt concerned about her BP being high as well. Pt reports she is trying to see a neurologist.

## 2024-06-21 ENCOUNTER — Other Ambulatory Visit: Payer: Self-pay

## 2024-06-21 ENCOUNTER — Emergency Department (HOSPITAL_COMMUNITY)
Admission: EM | Admit: 2024-06-21 | Discharge: 2024-06-21 | Disposition: A | Discharge status: Home / Self Care | Attending: Emergency Medicine | Admitting: Emergency Medicine

## 2024-06-21 ENCOUNTER — Encounter (HOSPITAL_COMMUNITY): Payer: Self-pay

## 2024-06-21 DIAGNOSIS — H9201 Otalgia, right ear: Secondary | ICD-10-CM | POA: Diagnosis present

## 2024-06-21 DIAGNOSIS — J0100 Acute maxillary sinusitis, unspecified: Secondary | ICD-10-CM | POA: Diagnosis not present

## 2024-06-21 MED ORDER — AMOXICILLIN-POT CLAVULANATE 875-125 MG PO TABS: 1.0000 | ORAL_TABLET | Freq: Two times a day (BID) | ORAL | 0 refills | Status: DC

## 2024-06-21 NOTE — Discharge Instructions (Signed)
 You were seen in the emergency department for right-sided face and ear pain in the setting of a migraine headache.  We are starting you on some antibiotics for a possible sinus infection.  Please continue your migraine medication and Tylenol  for pain.  Contact your neurologist and primary care doctor for outpatient follow-up.  Return if any worsening or concerning symptoms

## 2024-06-21 NOTE — ED Provider Notes (Signed)
 [place] [facility]MENT AT [facility] Provider Note   CSN: [id number] Arrival date & time: 06/21/24  1018     Patient presents with: Otalgia   Victoria Moore is a 48 y.o. female.  She has a history of migraines and has a continued headache for the past few weeks.  Has been seen before multiple times here and had a negative CT.  She has her headache along with new right side facial pain and ear pain.  No trauma no fever.  Has a little bit of nasal congestion.  Has had this before and had a tooth pulled which seemed to improve it for a while.  No cough or sore throat.  No difficulty speaking or swallowing.   The history is provided by the patient.  Otalgia Location:  Right Behind ear:  No abnormality Quality:  Throbbing Severity:  Moderate Onset quality:  Gradual Duration:  1 day Timing:  Constant Progression:  Unchanged Chronicity:  Recurrent Relieved by:  None tried Exacerbated by: chewing. Associated symptoms: headaches   Associated symptoms: no abdominal pain, no cough, no diarrhea, no ear discharge, no fever, no sore throat and no vomiting        Prior to Admission medications   Medication Sig Start Date End Date Taking? Authorizing Provider  rizatriptan  (MAXALT ) 5 MG tablet Take 5 mg by mouth as needed for migraine. May repeat in 2 hours if needed   Yes [provider]  acetaminophen  (TYLENOL ) 500 MG tablet Take 1,000 mg by mouth every 6 (six) hours as needed for moderate pain (pain score 4-6).    [provider]  albuterol  (PROVENTIL ) (2.5 MG/3ML) 0.083% nebulizer solution Inhale 3 mLs (2.5 mg total) via nebulizer every 6 (six) hours as needed for wheezing or shortness of breath. 01/24/23   Melvenia Manus BRAVO, MD  albuterol  (VENTOLIN  HFA) 108 (90 Base) MCG/ACT inhaler Inhale 2 puffs into the lungs every 4 (four) hours as needed. 12/06/23   Stuart Vernell Norris, PA-C  butalbital -acetaminophen -caffeine  (FIORICET ) 50-325-40 MG tablet Take 1-2  tablets by mouth every 6 (six) hours as needed for up to 14 days for headache. 06/08/24 06/22/24  Kammerer, Duwaine L, DO  chlorthalidone  (HYGROTON ) 25 MG tablet Take 1 tablet (25 mg total) by mouth daily. Pt needs to schedule appt. Pt can get a 90 day supply after office visit - 3rd and final attempt 08/22/23   Parthenia Olivia HERO, PA-C  fluticasone  (FLONASE ) 50 MCG/ACT nasal spray Place 1 spray into both nostrils 2 (two) times daily. 03/05/23   Stuart Vernell Norris, PA-C  ondansetron  (ZOFRAN -ODT) 4 MG disintegrating tablet Take 4 mg by mouth every 8 (eight) hours as needed. 01/28/24   [provider]  pantoprazole  (PROTONIX ) 40 MG tablet Take 40 mg by mouth daily. 01/28/24 01/27/25  [provider]  QUEtiapine  (SEROQUEL ) 100 MG tablet Take 1 tablet (100 mg total) by mouth at bedtime. 06/28/23   Golda Lynwood PARAS, MD  tiZANidine  (ZANAFLEX ) 4 MG tablet Take 1 tablet (4 mg total) by mouth every 8 (eight) hours as needed for muscle spasms. Do not take with alcohol or while driving or operating heavy machinery.  May cause drowsiness. 03/05/24   Chandra Harlene LABOR, NP    Allergies: Aspirin and Nsaids    Review of Systems  Constitutional:  Negative for fever.  HENT:  Positive for ear pain. Negative for ear discharge and sore throat.   Respiratory:  Negative for cough.   Gastrointestinal:  Negative for abdominal pain,  diarrhea and vomiting.  Neurological:  Positive for headaches.    Updated Vital Signs BP (!) 150/62 (BP Location: Right Arm)   Pulse 75   Temp 97.9 F (36.6 C) (Oral)   Resp 16   Ht 5' 10 (1.778 m)   Wt 107.5 kg   LMP 02/19/2016   SpO2 96%   BMI 34.01 kg/m   Physical Exam Constitutional:      Appearance: Normal appearance. She is well-developed.  HENT:     Head: Normocephalic and atraumatic.     Right Ear: Tympanic membrane, ear canal and external ear normal.     Left Ear: Tympanic membrane, ear canal and external ear normal.     Nose:     Right Sinus: Maxillary sinus  tenderness and frontal sinus tenderness present.     Mouth/Throat:     Mouth: Mucous membranes are moist.     Pharynx: Oropharynx is clear.   Eyes:     Extraocular Movements: Extraocular movements intact.     Conjunctiva/sclera: Conjunctivae normal.     Pupils: Pupils are equal, round, and reactive to light.    Musculoskeletal:     Cervical back: Neck supple.   Skin:    General: Skin is warm and dry.   Neurological:     Mental Status: She is alert.     GCS: GCS eye subscore is 4. GCS verbal subscore is 5. GCS motor subscore is 6.     (all labs ordered are listed, but only abnormal results are displayed) Labs Reviewed - No data to display  EKG: None  Radiology: No results found.   Procedures   Medications Ordered in the ED - No data to display                                  Medical Decision Making Risk Prescription drug management.   This patient complains of right ear and face pain, continued headache; this involves an extensive number of treatment Options and is a complaint that carries with it a high risk of complications and morbidity. The differential includes migraine, otitis media, dental infection, sinusitis Previous records obtained and reviewed in epic including recent ED visits and CT report Social determinants considered, depression and stress Critical Interventions: None  After the interventions stated above, I reevaluated the patient and found patient to be neuro intact and nontoxic-appearing Admission and further testing considered, no indications for admission at this time.  Will cover with antibiotics and recommended follow-up with her PCP and neurologist regarding her chronic headaches.  Return instructions discussed.      Final diagnoses:  Otalgia of right ear  Acute maxillary sinusitis, recurrence not specified    ED Discharge Orders          Ordered    amoxicillin -clavulanate (AUGMENTIN ) 875-125 MG tablet  Every 12 hours         06/21/24 1047               Towana Ozell BROCKS, MD 06/21/24 1717

## 2024-06-21 NOTE — ED Triage Notes (Signed)
 Pt arrived via POV from home c/o right ear pain, oral pain and severe headache that Pt reports began this morning. Pt denies injury. Pt reports it is painful to chew and eat.

## 2024-06-25 ENCOUNTER — Ambulatory Visit
Admission: RE | Admit: 2024-06-25 | Discharge: 2024-06-25 | Disposition: A | Discharge status: Home / Self Care | Source: Ambulatory Visit | Attending: Nurse Practitioner | Admitting: Nurse Practitioner

## 2024-06-25 ENCOUNTER — Ambulatory Visit
Admission: RE | Admit: 2024-06-25 | Discharge: 2024-06-25 | Disposition: A | Discharge status: Home / Self Care | Source: Ambulatory Visit

## 2024-06-25 DIAGNOSIS — W19XXXA Unspecified fall, initial encounter: Secondary | ICD-10-CM | POA: Diagnosis not present

## 2024-06-25 DIAGNOSIS — M79652 Pain in left thigh: Secondary | ICD-10-CM

## 2024-06-25 DIAGNOSIS — M545 Low back pain, unspecified: Secondary | ICD-10-CM

## 2024-06-25 MED ORDER — TIZANIDINE HCL 4 MG PO TABS: 4.0000 mg | ORAL_TABLET | Freq: Three times a day (TID) | ORAL | 0 refills | Status: AC | PRN

## 2024-06-25 NOTE — ED Provider Notes (Signed)
 [facility]    CSN: [id number] Arrival date & time: 06/25/24  0934      History   Chief Complaint Chief Complaint  Patient presents with   Back Pain    HPI Victoria Moore is a 48 y.o. female.   The history is provided by the patient.   Patient presents for complaints of left-sided low back pain and left thigh pain after she fell and tripped over a cord earlier this morning.  Patient states that she landed on my left side.  She complains of pain and tenderness to the left lower back into the left thigh.  She denies bruising, swelling, redness, inability to ambulate, or obvious deformity.  Patient states that she took Tylenol  for her symptoms with minimal relief.  Per review of her chart, patient with history of low back pain and arthritis.  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 Fibromyalgia, doubt sarcoid    Asthma    Blood transfusion without reported diagnosis    Chest wall pain 05/26/2018   Chronic kidney disease    Chronic low back pain with sciatica 09/15/2018   Common migraine with intractable migraine 10/07/2016   Cough variant asthma vs UACS  10/23/2016   - Spirometry 04/15/2016  Very truncated exp loop effort dep portion only  - Allergy  profile 10/22/2016 >  Eos 0.2/  IgE  52 RAST POS grass/trees/ragweed  10/22/2016  After extensive coaching HFA effectiveness =    75% try duelra 100 2bid > improved   Daytime sleepiness 05/26/2018   Decreased pedal pulses 10/04/2018   Depression    Depression, major, single episode, moderate (HCC) 05/26/2018   Diabetes (HCC)    Dyspnea 04/15/2016   04/15/2016  Walked RA x 3 laps @ 185 ft each stopped due to  End of study, nl pace, no sob or desat    - Spirometry 04/15/2016  Very truncated exp loop effort dep portion only  - 10/22/2016   Walked RA x 3 laps @ 185 ft each stopped due to  End of study, slow pace, min sob/ no desat - full pfts rec 10/22/2016 >>>       Edema    Essential hypertension, benign 11/15/2016   Fibroids 03/11/2016   Frequency of urination 09/11/2018   GERD (gastroesophageal reflux disease)    Herniated lumbar intervertebral disc    Hiatal hernia    Hilar adenopathy    Hypertension    Hypokalemia 06/28/2018   Impaired fasting blood sugar 09/11/2018   Iron deficiency    Leg pain 12/07/2018   Low libido 11/27/2018   Migraine    Mild sleep apnea 08/03/2018   HST 06/20/18  AHI  8.1 / snoring with 02 nadir 80% >  08/03/2018 rec sleep medicine consultation    Morbid (severe) obesity due to excess calories (HCC) 10/23/2016   Personal history of sarcoidosis 05/26/2018   Prolonged capillary refill time    Right leg swelling 08/18/2018   Sarcoidosis    personal history of   Seizures (HCC)    history of    Sleep apnea    Spondylosis    Vitamin D  deficiency     Patient Active Problem List   Diagnosis Date Noted  Peripheral neuropathy 10/12/2022   MDD (major depressive disorder) 10/12/2022   History of epilepsy 09/22/2022   Asthmatic bronchitis 01/23/2021   Social anxiety disorder 10/03/2020   Attention deficit hyperactivity disorder (ADHD), 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   S/P gastric bypass 05/12/2020   Intractable abdominal pain 04/27/2020   Benign intracranial hypertension 02/14/2020   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   Complex cyst of left ovary 02/01/2019   Sarcoidosis 02/01/2019   Leg pain 12/07/2018   Low libido 11/27/2018   Decreased pedal pulses 10/04/2018   Chronic low back pain with sciatica 09/15/2018   Impaired fasting blood sugar 09/11/2018   Mild sleep apnea 08/03/2018   Hypokalemia 06/28/2018   Medication side effect 06/22/2018   Vitamin D  deficiency 05/26/2018   Encounter for health maintenance  examination with abnormal findings 05/26/2018   Depression, major, single episode, moderate (HCC) 05/26/2018   Daytime sleepiness 05/26/2018   GERD (gastroesophageal reflux disease) 05/26/2018   Essential hypertension, benign 11/15/2016   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/10/2016   Class 1 obesity with body mass index (BMI) of 32.0 to 32.9 in adult 10/23/2016   Common migraine with intractable migraine 10/07/2016   DOE (dyspnea on exertion) 04/15/2016   Seizures (HCC) 03/11/2016   Anemia 11/12/2014   Iron deficiency 11/12/2014    Past Surgical History:  Procedure Laterality Date   ABDOMINAL HYSTERECTOMY     ESOPHAGEAL MANOMETRY N/A 09/05/2019   Procedure: ESOPHAGEAL MANOMETRY (EM);  Surgeon: San Sandor GAILS, DO;  Location: [facility];  Service: Gastroenterology;  Laterality: N/A;   LAPAROSCOPIC OVARIAN CYSTECTOMY Left 03/11/2016   Procedure: LAPAROSCOPIC OVARIAN CYSTECTOMY;  Surgeon: Shanda SHAUNNA Muscat, MD;  Location: [facility];  Service: Gynecology;  Laterality: Left;   LAPAROSCOPIC VAGINAL HYSTERECTOMY WITH SALPINGECTOMY Bilateral 03/11/2016   Procedure: LAPAROSCOPIC ASSISTED VAGINAL HYSTERECTOMY WITH SALPINGECTOMY;  Surgeon: Shanda SHAUNNA Muscat, MD;  Location: [facility];  Service: Gynecology;  Laterality: Bilateral;   PH IMPEDANCE STUDY  09/05/2019   Procedure: PH IMPEDANCE STUDY;  Surgeon: San Sandor GAILS, DO;  Location: [facility];  Service: Gastroenterology;;   TUBAL LIGATION     UPPER GASTROINTESTINAL ENDOSCOPY  10/11/2019   06/2019    OB History     Gravida  1   Para      Term      Preterm      AB      Living         SAB      IAB      Ectopic      Multiple      Live Births               Home Medications    Prior to Admission medications   Medication Sig Start Date End Date Taking? Authorizing Provider  chlorthalidone  (HYGROTON ) 25 MG tablet Take 1 tablet (25 mg total) by mouth daily. Pt needs to schedule appt. Pt can get a 90 day supply after office visit  - 3rd and final attempt 08/22/23  Yes Parthenia Olivia HERO, PA-C  pantoprazole  (PROTONIX ) 40 MG tablet Take 40 mg by mouth daily. 01/28/24 01/27/25 Yes [provider]  QUEtiapine  (SEROQUEL ) 100 MG tablet Take 1 tablet (100 mg total) by mouth at bedtime. 06/28/23  Yes Golda Lynwood PARAS, MD  tiZANidine  (ZANAFLEX ) 4 MG tablet Take 1 tablet (4 mg total) by mouth every 8 (eight) hours as needed for muscle spasms. 06/25/24  Yes Leath-Warren, Etta  J, NP  acetaminophen  (TYLENOL ) 500 MG tablet Take 1,000 mg by mouth every 6 (six) hours as needed for moderate pain (pain score 4-6).    [provider]  albuterol  (PROVENTIL ) (2.5 MG/3ML) 0.083% nebulizer solution Inhale 3 mLs (2.5 mg total) via nebulizer every 6 (six) hours as needed for wheezing or shortness of breath. 01/24/23   Melvenia Manus BRAVO, MD  albuterol  (VENTOLIN  HFA) 108 (90 Base) MCG/ACT inhaler Inhale 2 puffs into the lungs every 4 (four) hours as needed. 12/06/23   Stuart Vernell Norris, PA-C  amoxicillin -clavulanate (AUGMENTIN ) 875-125 MG tablet Take 1 tablet by mouth every 12 (twelve) hours. 06/21/24   Towana Ozell BROCKS, MD  fluticasone  (FLONASE ) 50 MCG/ACT nasal spray Place 1 spray into both nostrils 2 (two) times daily. 03/05/23   Stuart Vernell Norris, PA-C  ondansetron  (ZOFRAN -ODT) 4 MG disintegrating tablet Take 4 mg by mouth every 8 (eight) hours as needed. 01/28/24   [provider]  rizatriptan  (MAXALT ) 5 MG tablet Take 5 mg by mouth as needed for migraine. May repeat in 2 hours if needed    [provider]    Family History Family History  Adopted: Yes  Problem Relation Age of Onset   Asthma Mother    Heart Problems Mother    Migraines Mother    COPD Mother    Heart attack Mother    Diabetes Father    Peptic Ulcer Father    Heart Problems Father    COPD Father    Heart attack Father    Dementia Maternal Grandmother    Heart Problems Maternal Grandfather    Heart attack Maternal Grandfather    Diabetes  Paternal Grandmother    Diabetes Paternal Grandfather    Other Son        Growing pains   Colon cancer Neg Hx    Colon polyps Neg Hx    Esophageal cancer Neg Hx    Rectal cancer Neg Hx    Stomach cancer Neg Hx     Social History Social History   Tobacco Use   Smoking status: Former    Current packs/day: 0.00    Average packs/day: 0.3 packs/day for 17.0 years (4.3 ttl pk-yrs)    Types: Cigarettes    Start date: 03/02/1996    Quit date: 03/02/2013    Years since quitting: 11.3   Smokeless tobacco: Never  Vaping Use   Vaping status: Never Used  Substance Use Topics   Alcohol use: Yes    Comment: occasional   Drug use: Not Currently    Types: Marijuana    Comment: former - 6 yrs ago     Allergies   Aspirin and Nsaids   Review of Systems Review of Systems Per HPI   Physical Exam Triage Vital Signs ED Triage Vitals [06/25/24 1021]  Encounter Vitals Group     BP 116/76     Girls Systolic BP Percentile      Girls Diastolic BP Percentile      Boys Systolic BP Percentile      Boys Diastolic BP Percentile      Pulse Rate 66     Resp 16     Temp 98.4 F (36.9 C)     Temp Source Oral     SpO2 98 %     Weight      Height      Head Circumference      Peak Flow      Pain Score 10  Pain Loc      Pain Education      Exclude from Growth Chart    No data found.  Updated Vital Signs BP 116/76 (BP Location: Right Arm)   Pulse 66   Temp 98.4 F (36.9 C) (Oral)   Resp 16   LMP 02/19/2016   SpO2 98%   Visual Acuity Right Eye Distance:   Left Eye Distance:   Bilateral Distance:    Right Eye Near:   Left Eye Near:    Bilateral Near:     Physical Exam Vitals and nursing note reviewed.  Constitutional:      General: She is not in acute distress.    Appearance: Normal appearance.   Eyes:     Extraocular Movements: Extraocular movements intact.     Pupils: Pupils are equal, round, and reactive to light.    Cardiovascular:     Rate and Rhythm: Regular  rhythm.     Pulses: Normal pulses.     Heart sounds: Normal heart sounds.  Pulmonary:     Effort: Pulmonary effort is normal.     Breath sounds: Normal breath sounds.  Abdominal:     Palpations: Abdomen is soft.     Tenderness: There is no abdominal tenderness.   Musculoskeletal:     Cervical back: Normal range of motion.     Lumbar back: Tenderness (left lower back tenderness in the paraspinal region) present. No swelling, deformity, spasms or bony tenderness. Decreased range of motion. Negative left straight leg raise test.       Back:       Legs:  Lymphadenopathy:     Cervical: No cervical adenopathy.   Skin:    General: Skin is warm and dry.   Neurological:     General: No focal deficit present.     Mental Status: She is alert and oriented to person, place, and time.   Psychiatric:        Mood and Affect: Mood normal.        Behavior: Behavior normal.      UC Treatments / Results  Labs (all labs ordered are listed, but only abnormal results are displayed) Labs Reviewed - No data to display  EKG   Radiology No results found.  Procedures Procedures (including critical care time)  Medications Ordered in UC Medications - No data to display  Initial Impression / Assessment and Plan / UC Course  I have reviewed the triage vital signs and the nursing notes.  Pertinent labs & imaging results that were available during my care of the patient were reviewed by me and considered in my medical decision making (see chart for details).  X-ray of the lumbar spine is pending.  No obvious deformity, bruising, erythema, or swelling noted to the left lower back or to the left thigh.  Symptoms most likely caused by soft tissue injury.  Tizanidine  4 mg prescribed for pain.  Supportive care recommendations were provided and discussed with the patient to include continuing over-the-counter analgesics, warm or cool compresses to the affected area, and stretching exercises for her  back.  Patient was given strict ER follow-up precautions.  Patient was in agreement with this plan of care and verbalizes understanding.  All questions were answered.  Patient stable for discharge.  Work note was provided. Final Clinical Impressions(s) / UC Diagnoses   Final diagnoses:  Left-sided low back pain without sciatica, unspecified chronicity  Pain in left thigh  Fall, initial encounter  Discharge Instructions      Please go to [facility] for x-ray of your low back.  You will need to go to the radiology department to have your x-ray performed.  When the results of the x-ray are received, you will be contacted.  You will also have access to your results via MyChart. Take medication as prescribed. Continue over-the-counter Tylenol  or ibuprofen  as needed for pain or general discomfort. Recommend use of ice or heat.  Apply ice for pain or swelling, heat for spasm or stiffness.  Apply for 20 minutes, remove for 1 hour, repeat as needed. Try to remain as active as possible.  I provided some stretching exercises for you to perform at least 2-3 times daily. Follow-up in the emergency department immediately if you experience loss of bowel or bladder function, become unable to walk, or have weakness in your lower extremities. If symptoms fail to improve over the next 2 to 3 weeks, or recommend follow-up with your primary care physician for further evaluation. Follow-up as needed.      ED Prescriptions     Medication Sig Dispense Auth. Provider   tiZANidine  (ZANAFLEX ) 4 MG tablet Take 1 tablet (4 mg total) by mouth every 8 (eight) hours as needed for muscle spasms. 20 tablet Leath-Warren, Etta PARAS, NP      PDMP not reviewed this encounter.   Gilmer Etta PARAS, NP 06/25/24 1130

## 2024-06-25 NOTE — Discharge Instructions (Addendum)
 Please go to [facility] for x-ray of your low back.  You will need to go to the radiology department to have your x-ray performed.  When the results of the x-ray are received, you will be contacted.  You will also have access to your results via MyChart. Take medication as prescribed. Continue over-the-counter Tylenol  or ibuprofen  as needed for pain or general discomfort. Recommend use of ice or heat.  Apply ice for pain or swelling, heat for spasm or stiffness.  Apply for 20 minutes, remove for 1 hour, repeat as needed. Try to remain as active as possible.  I provided some stretching exercises for you to perform at least 2-3 times daily. Follow-up in the emergency department immediately if you experience loss of bowel or bladder function, become unable to walk, or have weakness in your lower extremities. If symptoms fail to improve over the next 2 to 3 weeks, or recommend follow-up with your primary care physician for further evaluation. Follow-up as needed.

## 2024-06-25 NOTE — ED Triage Notes (Signed)
 Pt states she tripped over a cod this morning and now having pain in her lower back/ hip and thigh on left side.

## 2024-07-18 ENCOUNTER — Other Ambulatory Visit: Payer: Self-pay

## 2024-07-18 ENCOUNTER — Ambulatory Visit

## 2024-07-18 ENCOUNTER — Ambulatory Visit
Admission: RE | Admit: 2024-07-18 | Discharge: 2024-07-18 | Disposition: A | Discharge status: Home / Self Care | Attending: Nurse Practitioner | Admitting: Nurse Practitioner

## 2024-07-18 ENCOUNTER — Ambulatory Visit: Payer: Self-pay

## 2024-07-18 VITALS — BP 132/94 | HR 59 | Temp 98.6°F | Resp 18

## 2024-07-18 DIAGNOSIS — G43001 Migraine without aura, not intractable, with status migrainosus: Secondary | ICD-10-CM

## 2024-07-18 MED ORDER — DEXAMETHASONE SODIUM PHOSPHATE 10 MG/ML IJ SOLN
10.0000 mg | Freq: Once | INTRAMUSCULAR | Status: AC
  Administered 2024-07-18: 10 mg via INTRAMUSCULAR

## 2024-07-18 MED ORDER — SUMATRIPTAN SUCCINATE 6 MG/0.5ML ~~LOC~~ SOLN
6.0000 mg | Freq: Once | SUBCUTANEOUS | Status: AC
  Administered 2024-07-18: 6 mg via SUBCUTANEOUS

## 2024-07-18 MED ORDER — CYCLOBENZAPRINE HCL 10 MG PO TABS: 10.0000 mg | ORAL_TABLET | Freq: Two times a day (BID) | ORAL | 0 refills | Status: DC | PRN

## 2024-07-18 NOTE — ED Triage Notes (Signed)
 Pt reports pain in back of head/neck x2 days. Denies any known injury.

## 2024-07-18 NOTE — Discharge Instructions (Addendum)
 You were given injections of Decadron  and Imitrex  today.  C Take medication as prescribed. You may take over-the-counter Tylenol  or Ibuprofen  as needed for pain or discomfort. Apply cool compresses to the forehead to help with headache pain or discomfort. Recommend sleeping in a dimly lit room and avoiding loud noises or sounds while symptoms persist. Recommend keeping a headache diary for you to take with you to your neurologist appointment. Go to the emergency department immediately if you experience worsening headache, worsening dizziness, visual changes, or if symptoms fail to improve. Follow-up with neurology as scheduled. Follow-up as needed.

## 2024-07-18 NOTE — ED Provider Notes (Signed)
 [facility]    CSN: [id number] Arrival date & time: 07/18/24  1615      History   Chief Complaint Chief Complaint  Patient presents with   Headache    Back middle of my head down to neck - Entered by patient    HPI Victoria Moore is a 48 y.o. female.   The history is provided by the patient.   Patient presents for complaints of headache and neck pain has been present for the past 2 days.  Patient states that headache is in the back of her head which is also causing her to have neck pain.  She also endorses light sensitivity, sound sensitivity, and blurred vision.  She denies dizziness, lightheadedness, vomiting, diarrhea, or lower extremity weakness.  Patient reports headaches are becoming more frequent.  She denies an aura with her headaches.  She is unaware of any triggers that cause her headaches. States that she is scheduled to see her neurologist, but cannot get an appointment until September.  Patient states that she did try to have her husband massage her neck with some relief of her headache pain.  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 Fibromyalgia, doubt sarcoid    Asthma    Blood transfusion without reported diagnosis    Chest wall pain 05/26/2018   Chronic kidney disease    Chronic low back pain with sciatica 09/15/2018   Common migraine with intractable migraine 10/07/2016   Cough variant asthma vs UACS  10/23/2016   - Spirometry 04/15/2016  Very truncated exp loop effort dep portion only  - Allergy  profile 10/22/2016 >  Eos 0.2/  IgE  52 RAST POS grass/trees/ragweed  10/22/2016  After extensive coaching HFA effectiveness =    75% try duelra 100 2bid > improved   Daytime sleepiness 05/26/2018   Decreased pedal pulses 10/04/2018   Depression    Depression, major,  single episode, moderate (HCC) 05/26/2018   Diabetes (HCC)    Dyspnea 04/15/2016   04/15/2016  Walked RA x 3 laps @ 185 ft each stopped due to  End of study, nl pace, no sob or desat    - Spirometry 04/15/2016  Very truncated exp loop effort dep portion only  - 10/22/2016  Walked RA x 3 laps @ 185 ft each stopped due to  End of study, slow pace, min sob/ no desat - full pfts rec 10/22/2016 >>>       Edema    Essential hypertension, benign 11/15/2016   Fibroids 03/11/2016   Frequency of urination 09/11/2018   GERD (gastroesophageal reflux disease)    Herniated lumbar intervertebral disc    Hiatal hernia    Hilar adenopathy    Hypertension    Hypokalemia 06/28/2018   Impaired fasting blood sugar 09/11/2018   Iron deficiency    Leg pain 12/07/2018   Low libido 11/27/2018   Migraine    Mild sleep apnea 08/03/2018   HST 06/20/18  AHI  8.1 / snoring with 02 nadir 80% >  08/03/2018 rec sleep medicine consultation    Morbid (severe) obesity due to excess calories (HCC) 10/23/2016   Personal history of sarcoidosis 05/26/2018   Prolonged capillary refill time    Right leg  swelling 08/18/2018   Sarcoidosis    personal history of   Seizures (HCC)    history of    Sleep apnea    Spondylosis    Vitamin D  deficiency     Patient Active Problem List   Diagnosis Date Noted   Peripheral neuropathy 10/12/2022   MDD (major depressive disorder) 10/12/2022   History of epilepsy 09/22/2022   Asthmatic bronchitis 01/23/2021   Social anxiety disorder 10/03/2020   Attention deficit hyperactivity disorder (ADHD), 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   S/P gastric bypass 05/12/2020   Intractable abdominal pain 04/27/2020   Benign intracranial hypertension 02/14/2020   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   Complex cyst of left ovary 02/01/2019   Sarcoidosis 02/01/2019   Leg pain 12/07/2018   Low libido 11/27/2018   Decreased pedal  pulses 10/04/2018   Chronic low back pain with sciatica 09/15/2018   Impaired fasting blood sugar 09/11/2018   Mild sleep apnea 08/03/2018   Hypokalemia 06/28/2018   Medication side effect 06/22/2018   Vitamin D  deficiency 05/26/2018   Encounter for health maintenance examination with abnormal findings 05/26/2018   Depression, major, single episode, moderate (HCC) 05/26/2018   Daytime sleepiness 05/26/2018   GERD (gastroesophageal reflux disease) 05/26/2018   Essential hypertension, benign 11/15/2016   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/10/2016   Class 1 obesity with body mass index (BMI) of 32.0 to 32.9 in adult 10/23/2016   Common migraine with intractable migraine 10/07/2016   DOE (dyspnea on exertion) 04/15/2016   Seizures (HCC) 03/11/2016   Anemia 11/12/2014   Iron deficiency 11/12/2014    Past Surgical History:  Procedure Laterality Date   ABDOMINAL HYSTERECTOMY     ESOPHAGEAL MANOMETRY N/A 09/05/2019   Procedure: ESOPHAGEAL MANOMETRY (EM);  Surgeon: San Sandor GAILS, DO;  Location: [facility];  Service: Gastroenterology;  Laterality: N/A;   LAPAROSCOPIC OVARIAN CYSTECTOMY Left 03/11/2016   Procedure: LAPAROSCOPIC OVARIAN CYSTECTOMY;  Surgeon: Shanda SHAUNNA Muscat, MD;  Location: [facility];  Service: Gynecology;  Laterality: Left;   LAPAROSCOPIC VAGINAL HYSTERECTOMY WITH SALPINGECTOMY Bilateral 03/11/2016   Procedure: LAPAROSCOPIC ASSISTED VAGINAL HYSTERECTOMY WITH SALPINGECTOMY;  Surgeon: Shanda SHAUNNA Muscat, MD;  Location: [facility];  Service: Gynecology;  Laterality: Bilateral;   PH IMPEDANCE STUDY  09/05/2019   Procedure: PH IMPEDANCE STUDY;  Surgeon: San Sandor GAILS, DO;  Location: [facility];  Service: Gastroenterology;;   TUBAL LIGATION     UPPER GASTROINTESTINAL ENDOSCOPY  10/11/2019   06/2019    OB History     Gravida  1   Para      Term      Preterm      AB      Living         SAB      IAB      Ectopic      Multiple      Live Births                Home Medications    Prior to Admission medications   Medication Sig Start Date End Date Taking? Authorizing Provider  cyclobenzaprine  (FLEXERIL ) 10 MG tablet Take 1 tablet (10 mg total) by mouth 2 (two) times daily as needed for muscle spasms. 07/18/24  Yes Leath-Warren, Etta PARAS, NP  acetaminophen  (TYLENOL ) 500 MG tablet Take 1,000 mg by mouth every 6 (six) hours as needed for moderate pain (pain score 4-6).    [provider]  albuterol  (  PROVENTIL ) (2.5 MG/3ML) 0.083% nebulizer solution Inhale 3 mLs (2.5 mg total) via nebulizer every 6 (six) hours as needed for wheezing or shortness of breath. 01/24/23   Melvenia Manus BRAVO, MD  albuterol  (VENTOLIN  HFA) 108 (90 Base) MCG/ACT inhaler Inhale 2 puffs into the lungs every 4 (four) hours as needed. 12/06/23   Stuart Vernell Norris, PA-C  amoxicillin -clavulanate (AUGMENTIN ) 875-125 MG tablet Take 1 tablet by mouth every 12 (twelve) hours. 06/21/24   Towana Ozell BROCKS, MD  chlorthalidone  (HYGROTON ) 25 MG tablet Take 1 tablet (25 mg total) by mouth daily. Pt needs to schedule appt. Pt can get a 90 day supply after office visit - 3rd and final attempt 08/22/23   Parthenia Olivia HERO, PA-C  fluticasone  (FLONASE ) 50 MCG/ACT nasal spray Place 1 spray into both nostrils 2 (two) times daily. 03/05/23   Stuart Vernell Norris, PA-C  ondansetron  (ZOFRAN -ODT) 4 MG disintegrating tablet Take 4 mg by mouth every 8 (eight) hours as needed. 01/28/24   [provider]  pantoprazole  (PROTONIX ) 40 MG tablet Take 40 mg by mouth daily. 01/28/24 01/27/25  [provider]  QUEtiapine  (SEROQUEL ) 100 MG tablet Take 1 tablet (100 mg total) by mouth at bedtime. 06/28/23   Golda Lynwood PARAS, MD  rizatriptan  (MAXALT ) 5 MG tablet Take 5 mg by mouth as needed for migraine. May repeat in 2 hours if needed    [provider]  tiZANidine  (ZANAFLEX ) 4 MG tablet Take 1 tablet (4 mg total) by mouth every 8 (eight) hours as needed for muscle spasms. 06/25/24    Leath-Warren, Etta PARAS, NP    Family History Family History  Adopted: Yes  Problem Relation Age of Onset   Asthma Mother    Heart Problems Mother    Migraines Mother    COPD Mother    Heart attack Mother    Diabetes Father    Peptic Ulcer Father    Heart Problems Father    COPD Father    Heart attack Father    Dementia Maternal Grandmother    Heart Problems Maternal Grandfather    Heart attack Maternal Grandfather    Diabetes Paternal Grandmother    Diabetes Paternal Grandfather    Other Son        Growing pains   Colon cancer Neg Hx    Colon polyps Neg Hx    Esophageal cancer Neg Hx    Rectal cancer Neg Hx    Stomach cancer Neg Hx     Social History Social History   Tobacco Use   Smoking status: Former    Current packs/day: 0.00    Average packs/day: 0.3 packs/day for 17.0 years (4.3 ttl pk-yrs)    Types: Cigarettes    Start date: 03/02/1996    Quit date: 03/02/2013    Years since quitting: 11.3   Smokeless tobacco: Never  Vaping Use   Vaping status: Never Used  Substance Use Topics   Alcohol use: Yes    Comment: occasional   Drug use: Not Currently    Types: Marijuana    Comment: former - 6 yrs ago     Allergies   Aspirin and Nsaids   Review of Systems Review of Systems Per HPI  Physical Exam Triage Vital Signs ED Triage Vitals [07/18/24 1633]  Encounter Vitals Group     BP (!) 132/94     Girls Systolic BP Percentile      Girls Diastolic BP Percentile      Boys Systolic BP Percentile  Boys Diastolic BP Percentile      Pulse Rate (!) 59     Resp 18     Temp 98.6 F (37 C)     Temp Source Oral     SpO2 96 %     Weight      Height      Head Circumference      Peak Flow      Pain Score 8     Pain Loc      Pain Education      Exclude from Growth Chart    No data found.  Updated Vital Signs BP (!) 132/94 (BP Location: Right Arm)   Pulse (!) 59   Temp 98.6 F (37 C) (Oral)   Resp 18   LMP 02/19/2016   SpO2 96%   Visual  Acuity Right Eye Distance:   Left Eye Distance:   Bilateral Distance:    Right Eye Near:   Left Eye Near:    Bilateral Near:     Physical Exam Vitals and nursing note reviewed.  Constitutional:      General: She is not in acute distress.    Appearance: She is well-developed.  HENT:     Head: Normocephalic.     Right Ear: Tympanic membrane, ear canal and external ear normal.     Left Ear: Tympanic membrane, ear canal and external ear normal.     Nose: Nose normal.     Mouth/Throat:     Mouth: Mucous membranes are moist.  Eyes:     Extraocular Movements: Extraocular movements intact.     Pupils: Pupils are equal, round, and reactive to light.  Cardiovascular:     Rate and Rhythm: Regular rhythm. Bradycardia present.     Heart sounds: Normal heart sounds.  Pulmonary:     Effort: Pulmonary effort is normal. No respiratory distress.     Breath sounds: Normal breath sounds. No stridor. No wheezing, rhonchi or rales.  Abdominal:     General: Bowel sounds are normal.     Palpations: Abdomen is soft.  Musculoskeletal:     Cervical back: Normal range of motion and neck supple.  Skin:    General: Skin is warm and dry.  Neurological:     General: No focal deficit present.     Mental Status: She is alert and oriented to person, place, and time.     GCS: GCS eye subscore is 4. GCS verbal subscore is 5. GCS motor subscore is 6.     Cranial Nerves: No cranial nerve deficit.     Sensory: No sensory deficit.     Motor: No weakness.     Gait: Gait normal.  Psychiatric:        Mood and Affect: Mood normal.        Behavior: Behavior normal.      UC Treatments / Results  Labs (all labs ordered are listed, but only abnormal results are displayed) Labs Reviewed - No data to display  EKG   Radiology No results found.  Procedures Procedures (including critical care time)  Medications Ordered in UC Medications  dexamethasone  (DECADRON ) injection 10 mg (has no administration  in time range)  SUMAtriptan  (IMITREX ) injection 6 mg (has no administration in time range)    Initial Impression / Assessment and Plan / UC Course  I have reviewed the triage vital signs and the nursing notes.  Pertinent labs & imaging results that were available during my care of the patient were  reviewed by me and considered in my medical decision making (see chart for details).  Decadron  10 mg IM and Imitrex  6 mg Tyler administered.  Neurological exam was without any deficits.  The patient is well-appearing, vital signs are mostly stable.  Flexeril  10 mg prescribed for headache pain and neck pain.  Supportive care recommendations were provided and discussed with the patient to include over-the-counter analgesics, increasing fluids, allowing for plenty of rest, and keeping a headache diary for her to take with her to the neurologist appointment.  Patient was given strict ER follow-up precautions.  Patient was in agreement with this plan of care and verbalizes understanding.  All questions were answered.  Patient stable for discharge.  Work note was provided.    Final Clinical Impressions(s) / UC Diagnoses   Final diagnoses:  Migraine without aura and with status migrainosus, not intractable     Discharge Instructions      You were given injections of Decadron  and Imitrex  today.  C Take medication as prescribed. You may take over-the-counter Tylenol  or Ibuprofen  as needed for pain or discomfort. Apply cool compresses to the forehead to help with headache pain or discomfort. Recommend sleeping in a dimly lit room and avoiding loud noises or sounds while symptoms persist. Recommend keeping a headache diary for you to take with you to your neurologist appointment. Go to the emergency department immediately if you experience worsening headache, worsening dizziness, visual changes, or if symptoms fail to improve. Follow-up with neurology as scheduled. Follow-up as needed.     ED  Prescriptions     Medication Sig Dispense Auth. Provider   cyclobenzaprine  (FLEXERIL ) 10 MG tablet Take 1 tablet (10 mg total) by mouth 2 (two) times daily as needed for muscle spasms. 20 tablet Leath-Warren, Etta PARAS, NP      PDMP not reviewed this encounter.   Gilmer Etta PARAS, NP 07/18/24 1700

## 2024-07-24 DIAGNOSIS — Z7689 Persons encountering health services in other specified circumstances: Secondary | ICD-10-CM | POA: Insufficient documentation

## 2024-07-24 NOTE — Progress Notes (Unsigned)
 Subjective:     Patient ID: Victoria Moore, female    DOB: [birth year], 48 y.o.   MRN: [id number]  No chief complaint on file.   HPI  From ****   Relationship:                 Employed:         Employer:  Resides with:  Education: (highest level):  Records Obtained: Yes OR No records today, In process***  Prior PCP:  PMHx:*** (PMH:  CA, CVD, HTN, CVA, DM, ETOH, Thyroid )***  FH: CA, MI/CAD, HTN, CVA, DM, ETOH, Mental Illness, Thyroid  Dz  PSx-( Tubal Lititagion, Cholesctectmy, appendectomy)?  Patient Care Team: System, Provider Not In as PCP - General Nahser, Aleene PARAS, MD (Inactive) as PCP - Cardiology (Cardiology)    Specialists:  Today, I'd like to focus on getting to know your overall health history. If there's anything urgent, I'll do my best to address it today but we can schedule follow-up visits to work through any remaining concerns. Are there any major concners you'd like to discuss today-- give me your top 2-3?   Meds:   Taking *** for ***  Compliance: Patient states they are/***are not compliant with medications.  ADLs: ***  Habits: ETOH:  Y/N   # Drinks per week         SA: Yes/ N/A ***            Smoking:  (Y/N)  ***PPD  Allergies: Aspirin, NSAID     Refills?        Patient denies fever, chills, SOB, CP, palpitations, dyspnea, edema, HA, vision changes, N/V/D, abdominal pain, urinary symptoms, rash, weight changes, and recent illness or hospitalizations.    History of Present Illness              Health Maintenance Due  Topic Date Due   Hepatitis B Vaccines (1 of 3 - 19+ 3-dose series) Never done   Zoster Vaccines- Shingrix (1 of 2) Never done   Pneumococcal Vaccine 4-81 Years old (2 of 2 - PCV) 11/14/2015   COVID-19 Vaccine (3 - Pfizer risk series) 10/22/2020   Cervical Cancer Screening (HPV/Pap Cotest)  11/06/2022   MAMMOGRAM  04/04/2024    Past Medical History:  Diagnosis Date   Acute low back pain without sciatica  06/22/2018   ADHD    Allergy     Anemia    Anxiety    Arthritis    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/10/2016   Refer to Rheumatology see Nov 25 2016 [place] :  ? Fibromyalgia, doubt sarcoid    Asthma    Blood transfusion without reported diagnosis    Chest wall pain 05/26/2018   Chronic kidney disease    Chronic low back pain with sciatica 09/15/2018   Common migraine with intractable migraine 10/07/2016   Cough variant asthma vs UACS  10/23/2016   - Spirometry 04/15/2016  Very truncated exp loop effort dep portion only  - Allergy  profile 10/22/2016 >  Eos 0.2/  IgE  52 RAST POS grass/trees/ragweed  10/22/2016  After extensive coaching HFA effectiveness =    75% try duelra 100 2bid > improved   Daytime sleepiness 05/26/2018   Decreased pedal pulses 10/04/2018   Depression    Depression, major, single episode, moderate (HCC) 05/26/2018   Diabetes (HCC)    Dyspnea 04/15/2016   04/15/2016  Walked RA x 3 laps @ 185 ft each stopped due to  End of study, nl pace, no sob  or desat    - Spirometry 04/15/2016  Very truncated exp loop effort dep portion only  - 10/22/2016  Walked RA x 3 laps @ 185 ft each stopped due to  End of study, slow pace, min sob/ no desat - full pfts rec 10/22/2016 >>>       Edema    Essential hypertension, benign 11/15/2016   Fibroids 03/11/2016   Frequency of urination 09/11/2018   GERD (gastroesophageal reflux disease)    Herniated lumbar intervertebral disc    Hiatal hernia    Hilar adenopathy    Hypertension    Hypokalemia 06/28/2018   Impaired fasting blood sugar 09/11/2018   Iron deficiency    Leg pain 12/07/2018   Low libido 11/27/2018   Migraine    Mild sleep apnea 08/03/2018   HST 06/20/18  AHI  8.1 / snoring with 02 nadir 80% >  08/03/2018 rec sleep medicine consultation    Morbid (severe) obesity due to excess calories (HCC) 10/23/2016   Personal history of sarcoidosis 05/26/2018   Prolonged capillary refill time    Right leg swelling 08/18/2018   Sarcoidosis    personal  history of   Seizures (HCC)    history of    Sleep apnea    Spondylosis    Vitamin D  deficiency     Past Surgical History:  Procedure Laterality Date   ABDOMINAL HYSTERECTOMY     ESOPHAGEAL MANOMETRY N/A 09/05/2019   Procedure: ESOPHAGEAL MANOMETRY (EM);  Surgeon: San Sandor GAILS, DO;  Location: [facility];  Service: Gastroenterology;  Laterality: N/A;   LAPAROSCOPIC OVARIAN CYSTECTOMY Left 03/11/2016   Procedure: LAPAROSCOPIC OVARIAN CYSTECTOMY;  Surgeon: Shanda SHAUNNA Muscat, MD;  Location: [facility];  Service: Gynecology;  Laterality: Left;   LAPAROSCOPIC VAGINAL HYSTERECTOMY WITH SALPINGECTOMY Bilateral 03/11/2016   Procedure: LAPAROSCOPIC ASSISTED VAGINAL HYSTERECTOMY WITH SALPINGECTOMY;  Surgeon: Shanda SHAUNNA Muscat, MD;  Location: [facility];  Service: Gynecology;  Laterality: Bilateral;   PH IMPEDANCE STUDY  09/05/2019   Procedure: PH IMPEDANCE STUDY;  Surgeon: San Sandor GAILS, DO;  Location: [facility];  Service: Gastroenterology;;   TUBAL LIGATION     UPPER GASTROINTESTINAL ENDOSCOPY  10/11/2019   06/2019    Family History  Adopted: Yes  Problem Relation Age of Onset   Asthma Mother    Heart Problems Mother    Migraines Mother    COPD Mother    Heart attack Mother    Diabetes Father    Peptic Ulcer Father    Heart Problems Father    COPD Father    Heart attack Father    Dementia Maternal Grandmother    Heart Problems Maternal Grandfather    Heart attack Maternal Grandfather    Diabetes Paternal Grandmother    Diabetes Paternal Grandfather    Other Son        Growing pains   Colon cancer Neg Hx    Colon polyps Neg Hx    Esophageal cancer Neg Hx    Rectal cancer Neg Hx    Stomach cancer Neg Hx     Social History   Socioeconomic History   Marital status: Significant Other    Spouse name: Not on file   Number of children: 1   Years of education: GED   Highest education level: Some college, no degree  Occupational History   Occupation: Lawyer  Tobacco Use    Smoking status: Former    Current packs/day: 0.00    Average packs/day: 0.3 packs/day for 17.0 years (  4.3 ttl pk-yrs)    Types: Cigarettes    Start date: 03/02/1996    Quit date: 03/02/2013    Years since quitting: 11.4   Smokeless tobacco: Never  Vaping Use   Vaping status: Never Used  Substance and Sexual Activity   Alcohol use: Yes    Comment: occasional   Drug use: Not Currently    Types: Marijuana    Comment: former - 6 yrs ago   Sexual activity: Not Currently    Birth control/protection: Surgical  Other Topics Concern   Not on file  Social History Narrative   She lives w/ her fiance'. She has one son.    Highest level of education:  12th grade, did not graduate. Currently back in school.   Right-handed   Caffeine : 1 cup of coffee some mornings      Social History      Diet? No       Do you drink/eat things with caffeine ? yes      Marital status?           Single                          What year were you married?       Do you live in a house, apartment, assisted living, condo, trailer, etc.? house      Is it one or more stories?     How many persons live in your home?  2      Do you have any pets in your home? (please list)   No       Highest level of education completed? Some college       Current or past profession:       Do you exercise?       Yes                                Type & how often? Daily       Advanced Directives      Do you have a living will? No       Do you have a DNR form?                                  If not, do you want to discuss one? No       Do you have signed POA/HPOA for forms?  No       Functional Status      Do you have difficulty bathing or dressing yourself? No       Do you have difficulty preparing food or eating? No       Do you have difficulty managing your medications? No       Do you have difficulty managing your finances? No       Do you have difficulty affording your medications? No       Social  Drivers of Corporate investment banker Strain: Low Risk  (01/24/2024)   Received from [facility]   Overall Financial Resource Strain (CARDIA)    Difficulty of Paying Living Expenses: Not very hard  Food Insecurity: No Food Insecurity (01/24/2024)   Received from [facility]   Hunger Vital Sign    Within the past 12 months, you worried that your food would  run out before you got the money to buy more.: Never true    Within the past 12 months, the food you bought just didn't last and you didn't have money to get more.: Never true  Transportation Needs: Unknown (01/24/2024)   Received from [facility] - Transportation    In the past 12 months, has lack of transportation kept you from medical appointments or from getting medications?: No    Lack of Transportation (Non-Medical): Not on file  Physical Activity: Unknown (04/22/2023)   Exercise Vital Sign    Days of Exercise per Week: 0 days    Minutes of Exercise per Session: Not on file  Stress: Stress Concern Present (04/22/2023)   [employer] of Occupational Health - Occupational Stress Questionnaire    Feeling of Stress : Very much  Social Connections: Moderately Isolated (04/22/2023)   Social Connection and Isolation Panel    Frequency of Communication with Friends and Family: More than three times a week    Frequency of Social Gatherings with Friends and Family: More than three times a week    Attends Religious Services: Never    Database administrator or Organizations: No    Attends Engineer, structural: Not on file    Marital Status: Married  Catering manager Violence: Not At Risk (08/16/2023)   Humiliation, Afraid, Rape, and Kick questionnaire    Fear of Current or Ex-Partner: No    Emotionally Abused: No    Physically Abused: No    Sexually Abused: No    Outpatient Medications Prior to Visit  Medication Sig Dispense Refill   acetaminophen  (TYLENOL )  500 MG tablet Take 1,000 mg by mouth every 6 (six) hours as needed for moderate pain (pain score 4-6).     albuterol  (PROVENTIL ) (2.5 MG/3ML) 0.083% nebulizer solution Inhale 3 mLs (2.5 mg total) via nebulizer every 6 (six) hours as needed for wheezing or shortness of breath. 90 mL 0   albuterol  (VENTOLIN  HFA) 108 (90 Base) MCG/ACT inhaler Inhale 2 puffs into the lungs every 4 (four) hours as needed. 18 g 0   amoxicillin -clavulanate (AUGMENTIN ) 875-125 MG tablet Take 1 tablet by mouth every 12 (twelve) hours. 10 tablet 0   chlorthalidone  (HYGROTON ) 25 MG tablet Take 1 tablet (25 mg total) by mouth daily. Pt needs to schedule appt. Pt can get a 90 day supply after office visit - 3rd and final attempt 15 tablet 0   cyclobenzaprine  (FLEXERIL ) 10 MG tablet Take 1 tablet (10 mg total) by mouth 2 (two) times daily as needed for muscle spasms. 20 tablet 0   fluticasone  (FLONASE ) 50 MCG/ACT nasal spray Place 1 spray into both nostrils 2 (two) times daily. 16 g 2   ondansetron  (ZOFRAN -ODT) 4 MG disintegrating tablet Take 4 mg by mouth every 8 (eight) hours as needed.     pantoprazole  (PROTONIX ) 40 MG tablet Take 40 mg by mouth daily.     QUEtiapine  (SEROQUEL ) 100 MG tablet Take 1 tablet (100 mg total) by mouth at bedtime. 90 tablet 3   rizatriptan  (MAXALT ) 5 MG tablet Take 5 mg by mouth as needed for migraine. May repeat in 2 hours if needed     tiZANidine  (ZANAFLEX ) 4 MG tablet Take 1 tablet (4 mg total) by mouth every 8 (eight) hours as needed for muscle spasms. 20 tablet 0   Facility-Administered Medications Prior to Visit  Medication Dose Route Frequency Provider Last Rate Last Admin   nitroGLYCERIN  (NITROSTAT ) SL  tablet 0.4 mg  0.4 mg Sublingual Q5 min PRN Ngetich, Dinah C, NP   0.4 mg at 09/14/19 1621    Allergies  Allergen Reactions   Aspirin Anaphylaxis and Hives        Nsaids Itching and Swelling    ROS     Objective:    Physical Exam   LMP 02/19/2016  Wt Readings from Last 3  Encounters:  06/21/24 236 lb 15.9 oz (107.5 kg)  06/13/24 237 lb (107.5 kg)  06/08/24 237 lb (107.5 kg)       Assessment & Plan:   Problem List Items Addressed This Visit   None   I am having [facility] maintain her albuterol , fluticasone , QUEtiapine , chlorthalidone , albuterol , acetaminophen , ondansetron , pantoprazole , rizatriptan , amoxicillin -clavulanate, tiZANidine , and cyclobenzaprine . We will continue to administer nitroGLYCERIN .  No orders of the defined types were placed in this encounter.

## 2024-07-24 NOTE — Patient Instructions (Signed)
 Thank you for choosing [facility] for your Primary Care needs. I am excited for the opportunity to partner with you to meet your health care goals. It was a pleasure meeting you today!  Information on diet, exercise, and health maintenance recommendations are listed below. This is information to help you be sure you are on track for optimal health and monitoring.   Please look over this and let us  know if you have any questions or if you have completed any of the health maintenance outside of [facility] so that we can be sure your records are up to date.  ___________________________________________________________  MyChart:  For all urgent or time sensitive needs we ask that you please call the office to avoid delays. Our number is [phone]. MyChart is not constantly monitored and due to the large volume of messages a day, replies may take up to 72 business hours.  MyChart Policy: MyChart allows for you to see your visit notes, after visit summary, provider recommendations, lab and tests results, make an appointment, request refills, and contact your provider or the office for non-urgent questions or concerns. Providers are seeing patients during normal business hours and do not have built in time to review MyChart messages.  We ask that you allow a minimum of 3 business days for responses to [employer]. For this reason, please do not send urgent requests through MyChart. Please call the office at [phone]. New and ongoing conditions may require a visit. We have virtual and in-person visits available for your convenience.  Complex MyChart concerns may require a visit. Your provider may request you schedule a virtual or in-person visit to ensure we are providing the best care possible. MyChart messages sent after 11:00 AM on Friday may not be received by the provider until Monday morning.    Lab and Test Results: You will receive your lab and test  results on MyChart as soon as they are completed and results have been sent by the lab or testing facility. Due to this service, you will receive your results BEFORE your provider.  I review lab and test results each morning prior to seeing patients. Some results require collaboration with other providers to ensure you are receiving the most appropriate care. For this reason, we ask that you please allow a minimum of 3-5 business days from the time that ALL results have been received for your provider to receive and review lab and test results and contact you about these.  Most lab and test result comments from the provider will be sent through MyChart. Your provider may recommend changes to the plan of care, follow-up visits, repeat testing, ask questions, or request an office visit to discuss these results. You may reply directly to this message or call the office to provide information for the provider or set up an appointment. In some instances, you will be called with test results and recommendations. Please let us  know if this is preferred and we will make note of this in your chart to provide this for you.    If you have not heard a response to your lab or test results in 5 business days from all results returning to MyChart, please call the office to let us  know. We ask that you please avoid calling prior to this time unless there is an emergent concern. Due to high call volumes, this can delay the resulting process.  After Hours: For all non-emergency after hours needs, please  call the office at [phone] and select the option to reach the on-call  service. On-call services are shared between multiple [place] offices and therefore it will not be possible to speak directly with your provider. On-call providers may provide medical advice and recommendations, but are unable to provide refills for maintenance medications.  For all emergency or urgent medical needs after normal business hours, we  recommend that you seek care at the closest Urgent Care or Emergency Department to ensure appropriate treatment in a timely manner.  [facility] has a 24 hour emergency room located on the ground floor for your convenience.   Urgent Concerns During the Business Day Providers are seeing patients from 8AM to 5PM with a busy schedule and are most often not able to respond to non-urgent calls until the end of the day or the next business day. If you should have URGENT concerns during the day, please call and speak to the nurse or schedule a same day appointment so that we can address your concern without delay.   Thank you, again, for choosing me as your health care partner. I appreciate your trust and look forward to learning more about you!   Melville Stade, DNP, AGNP-C  ___________________________________________________________  Health Maintenance Recommendations Screening Testing Mammogram Every 1-2 years based on history and risk factors Starting at age 50 Pap Smear Ages 21-39 every 3 years Ages 32-65 every 5 years with HPV testing More frequent testing may be required based on results and history Colon Cancer Screening Every 1-10 years based on test performed, risk factors, and history Starting at age 50 Bone Density Screening Every 2-10 years based on history Starting at age 50 for women Recommendations for men differ based on medication usage, history, and risk factors AAA Screening One time ultrasound Men 11-2 years old who have ever smoked Lung Cancer Screening Low Dose Lung CT every 12 months Age 50-80 years with a 20 pack-year smoking history who still smoke or who have quit within the last 15 years  Screening Labs Routine  Labs: Complete Blood Count (CBC), Complete Metabolic Panel (CMP), Cholesterol (Lipid Panel) Every 6-12 months based on history and medications May be recommended more frequently based on current conditions or previous results Hemoglobin  A1c Lab Every 3-12 months based on history and previous results Starting at age 65 or earlier with diagnosis of diabetes, high cholesterol, BMI >26, and/or risk factors Frequent monitoring for patients with diabetes to ensure blood sugar control Thyroid Panel  Every 6 months based on history, symptoms, and risk factors May be repeated more often if on medication HIV One time testing for all patients 88 and older May be repeated more frequently for patients with increased risk factors or exposure Hepatitis C One time testing for all patients 29 and older May be repeated more frequently for patients with increased risk factors or exposure Gonorrhea, Chlamydia Every 12 months for all sexually active persons 13-24 years Additional monitoring may be recommended for those who are considered high risk or who have symptoms PSA Men 16-24 years old with risk factors Additional screening may be recommended from age 36-69 based on risk factors, symptoms, and history  Vaccine Recommendations Tetanus Booster All adults every 10 years Flu Vaccine All patients 6 months and older every year COVID Vaccine All patients 12 years and older Initial dosing with booster May recommend additional booster based on age and health history HPV Vaccine 2 doses all patients age 17-26 Dosing may be considered for  patients over 26 Shingles Vaccine (Shingrix) 2 doses all adults 50 years and older Pneumonia (Pneumovax 23) All adults 65 years and older May recommend earlier dosing based on health history Pneumonia (Prevnar 77) All adults 65 years and older Dosed 1 year after Pneumovax 23 Pneumonia (Prevnar 20) All adults 65 years and older (adults 19-64 with certain conditions or risk factors) 1 dose  For those who have not received Prevnar 13 vaccine previously   Additional Screening, Testing, and Vaccinations may be recommended on an individualized basis based on family history, health history, risk  factors, and/or exposure.  __________________________________________________________  Diet Recommendations for All Patients  I recommend that all patients maintain a diet low in saturated fats, carbohydrates, and cholesterol. While this can be challenging at first, it is not impossible and small changes can make big differences.  Things to try: Decreasing the amount of soda, sweet tea, and/or juice to one or less per day and replace with water While water is always the first choice, if you do not like water you may consider adding a water additive without sugar to improve the taste other sugar free drinks Replace potatoes with a brightly colored vegetable  Use healthy oils, such as canola oil or olive oil, instead of butter or hard margarine Limit your bread intake to two pieces or less a day Replace regular pasta with low carb pasta options Bake, broil, or grill foods instead of frying Monitor portion sizes  Eat smaller, more frequent meals throughout the day instead of large meals  An important thing to remember is, if you love foods that are not great for your health, you don't have to give them up completely. Instead, allow these foods to be a reward when you have done well. Allowing yourself to still have special treats every once in a while is a nice way to tell yourself thank you for working hard to keep yourself healthy.   Also remember that every day is a new day. If you have a bad day and "fall off the wagon", you can still climb right back up and keep moving along on your journey!  We have resources available to help you!  Some websites that may be helpful include: www.http://www.wall-moore.info/  Www.VeryWellFit.com _____________________________________________________________  Activity Recommendations for All Patients  I recommend that all adults get at least 30 minutes of moderate physical activity that elevates your heart rate at least 5 days out of the week.  Some examples  include: Walking or jogging at a pace that allows you to carry on a conversation Cycling (stationary bike or outdoors) Water aerobics Yoga Weight lifting Dancing If physical limitations prevent you from putting stress on your joints, exercise in a pool or seated in a chair are excellent options.  Do determine your MAXIMUM heart rate for activity: 220 - YOUR AGE = MAX Heart Rate   Remember! Do not push yourself too hard.  Start slowly and build up your pace, speed, weight, time in exercise, etc.  Allow your body to rest between exercise and get good sleep. You will need more water than normal when you are exerting yourself. Do not wait until you are thirsty to drink. Drink with a purpose of getting in at least 8, 8 ounce glasses of water a day plus more depending on how much you exercise and sweat.    If you begin to develop dizziness, chest pain, abdominal pain, jaw pain, shortness of breath, headache, vision changes, lightheadedness, or other concerning symptoms, stop  the activity and allow your body to rest. If your symptoms are severe, seek emergency evaluation immediately. If your symptoms are concerning, but not severe, please let us  know so that we can recommend further evaluation.

## 2024-07-26 ENCOUNTER — Other Ambulatory Visit (HOSPITAL_BASED_OUTPATIENT_CLINIC_OR_DEPARTMENT_OTHER): Payer: Self-pay

## 2024-07-26 ENCOUNTER — Encounter: Payer: Self-pay | Admitting: Student

## 2024-07-26 ENCOUNTER — Ambulatory Visit (INDEPENDENT_AMBULATORY_CARE_PROVIDER_SITE_OTHER): Admitting: Student

## 2024-07-26 VITALS — BP 120/73 | HR 59 | Temp 98.9°F | Resp 12

## 2024-07-26 DIAGNOSIS — G43019 Migraine without aura, intractable, without status migrainosus: Secondary | ICD-10-CM

## 2024-07-26 DIAGNOSIS — E66811 Obesity, class 1: Secondary | ICD-10-CM | POA: Insufficient documentation

## 2024-07-26 DIAGNOSIS — K219 Gastro-esophageal reflux disease without esophagitis: Secondary | ICD-10-CM | POA: Diagnosis not present

## 2024-07-26 DIAGNOSIS — Z6832 Body mass index (BMI) 32.0-32.9, adult: Secondary | ICD-10-CM

## 2024-07-26 DIAGNOSIS — Z7689 Persons encountering health services in other specified circumstances: Secondary | ICD-10-CM

## 2024-07-26 DIAGNOSIS — Z23 Encounter for immunization: Secondary | ICD-10-CM | POA: Diagnosis not present

## 2024-07-26 DIAGNOSIS — G8929 Other chronic pain: Secondary | ICD-10-CM

## 2024-07-26 DIAGNOSIS — G932 Benign intracranial hypertension: Secondary | ICD-10-CM

## 2024-07-26 DIAGNOSIS — I10 Essential (primary) hypertension: Secondary | ICD-10-CM | POA: Diagnosis not present

## 2024-07-26 DIAGNOSIS — M5440 Lumbago with sciatica, unspecified side: Secondary | ICD-10-CM

## 2024-07-26 DIAGNOSIS — Z124 Encounter for screening for malignant neoplasm of cervix: Secondary | ICD-10-CM

## 2024-07-26 MED ORDER — TOPIRAMATE 25 MG PO TABS
25.0000 mg | ORAL_TABLET | Freq: Two times a day (BID) | ORAL | 1 refills | Status: DC
  Filled 2024-07-26: qty 60, 30d supply, fill #0
  Filled 2024-08-26: qty 60, 30d supply, fill #1

## 2024-07-26 NOTE — Assessment & Plan Note (Signed)
 Encouraged DASH or MIND diet, decrease po intake and increase exercise as tolerated. Needs 7-8 hours of sleep nightly. Avoid trans fats, eat small, frequent meals every 4-5 hours with lean proteins, complex carbs and healthy fats. Minimize simple carbs, high fat foods and processed foods

## 2024-07-26 NOTE — Assessment & Plan Note (Signed)
 Symptoms well-controlled, uses Tums occasionally not currently taking Protonix .

## 2024-07-26 NOTE — Assessment & Plan Note (Signed)
 Well controlled, no changes to meds. Encouraged heart healthy diet such as the DASH diet and exercise as tolerated.

## 2024-07-26 NOTE — Assessment & Plan Note (Signed)
 X-ray lumbar pending. Encouraged moist heat and gentle stretching as tolerated. May try NSAIDs and prescription meds as directed and report if symptoms worsen or seek immediate care.  Consider PT or Ortho referral. Reviewed PCP notes under this problem and see they have discussed patient may need referral to pain management.

## 2024-07-26 NOTE — Assessment & Plan Note (Signed)
 Rx-Topamax  25 mg twice daily.  Patient has been seen by neurology in the past, consider referral to neurology if no improvements.

## 2024-08-10 ENCOUNTER — Encounter: Payer: Self-pay | Admitting: Student

## 2024-08-10 DIAGNOSIS — Z0000 Encounter for general adult medical examination without abnormal findings: Secondary | ICD-10-CM | POA: Insufficient documentation

## 2024-08-10 DIAGNOSIS — N189 Chronic kidney disease, unspecified: Secondary | ICD-10-CM | POA: Insufficient documentation

## 2024-08-14 DIAGNOSIS — Z0000 Encounter for general adult medical examination without abnormal findings: Secondary | ICD-10-CM | POA: Insufficient documentation

## 2024-08-14 NOTE — Assessment & Plan Note (Signed)
 Rx-Topamax  25 mg twice daily. MAAXALT as needed. Patient has been seen by neurology in the past, consider referral to neurology if no improvements.

## 2024-08-14 NOTE — Assessment & Plan Note (Signed)
 Well controlled, no changes to meds. Encouraged heart healthy diet such as the DASH diet and exercise as tolerated.

## 2024-08-14 NOTE — Progress Notes (Deleted)
 Subjective:     Patient ID: Victoria Moore, female    DOB: [birth year], 48 y.o.   MRN: [id number]  Chief Complaint  Patient presents with   Medicare Wellness    HPI  Any questions or concerns today? (Skip to "sick visits" for OLDCART HPI)  Victoria Moore a 48 y.o.  female/female***presents today for a complete physical exam. Pt reports consuming a {diet types:17450} diet. {types:19826} Pt generally feels {DESC; WELL/FAIRLY WELL/POORLY:18703}. Reports sleeping {DESC; WELL/FAIRLY WELL/POORLY:18703}.  {does/does not:200015} have additional problems to discuss today.   New Surgeries or Family Hx: ***Yes/ Denies Habits: ETOH, illicit drugs, smoking, secondhand exposure, caffeine     Migraine-  Patient was started on on topiramate  25 mg twice daily, reports headaches have ****  Asthma- reports well controlled- excessive induced. Albuterol  prn   Allergy -Flonase  1 spray both nostrils daily   ADHD-Adderall ER 20 mg daily, *** who prescribes this?   HTN- chlorthalidone  (Hygroton ) 25 mg daily, reports BP is well-controlled.   GERD-previously on Protonix  40 mg daily, patient has not been taking this and states her symptoms are well-controlled.  Takes Tums occasionally  Chronic Low Back Pain Xray order last OV, Pt has not gotten Xray******    HCM: -Mammogram: Last 2017, DUE -Pap: Last 2020, WNL- Due -DEXA: Consider earlier Dexa r/t smoking Hx - Colonoscopy: last 08/2019, Due 08/2029 - Immunizations: Covid-19, Shingles Vaccine due    Patient denies fever, chills, SOB, CP, palpitations, dyspnea, edema, HA, vision changes, N/V/D, abdominal pain, urinary symptoms, rash, weight changes, and recent illness or hospitalizations.   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 Fibromyalgia, doubt sarcoid    Asthma    Blood transfusion without reported diagnosis    Chest wall pain 05/26/2018   Chronic kidney disease    Chronic low back pain with sciatica 09/15/2018   Common migraine with intractable migraine 10/07/2016   Cough variant asthma vs UACS  10/23/2016   - Spirometry 04/15/2016  Very truncated exp loop effort dep portion only  - Allergy  profile 10/22/2016 >  Eos 0.2/  IgE  52 RAST POS grass/trees/ragweed  10/22/2016  After extensive coaching HFA effectiveness =    75% try duelra 100 2bid > improved   Daytime sleepiness 05/26/2018   Decreased pedal pulses 10/04/2018   Depression    Depression, major, single episode, moderate (HCC) 05/26/2018   Diabetes (HCC)    Dyspnea 04/15/2016   04/15/2016  Walked RA x 3 laps @ 185 ft each stopped due to  End of study, nl pace, no sob or desat    - Spirometry 04/15/2016  Very truncated exp loop effort dep portion only  - 10/22/2016  Walked RA x 3 laps @ 185 ft each stopped due  to  End of study, slow pace, min sob/ no desat - full pfts rec 10/22/2016 >>>       Edema    Essential hypertension, benign 11/15/2016   Fibroids 03/11/2016   Frequency of urination 09/11/2018   GERD (gastroesophageal reflux disease)    Herniated lumbar intervertebral disc    Hiatal hernia    Hilar adenopathy    Hypertension    Hypokalemia 06/28/2018   Impaired fasting blood sugar 09/11/2018   Iron deficiency    Leg pain 12/07/2018   Low libido 11/27/2018   Migraine    Mild sleep apnea 08/03/2018   HST 06/20/18  AHI  8.1 / snoring with 02 nadir 80% >  08/03/2018 rec sleep medicine  consultation    Morbid (severe) obesity due to excess calories (HCC) 10/23/2016   Personal history of sarcoidosis 05/26/2018   Prolonged capillary refill time    Right leg swelling 08/18/2018   Sarcoidosis    personal history of   Seizures (HCC)    history of    Sleep apnea    Spondylosis    Vitamin D  deficiency     Past Surgical History:  Procedure Laterality Date   ABDOMINAL HYSTERECTOMY     ESOPHAGEAL MANOMETRY N/A 09/05/2019   Procedure: ESOPHAGEAL MANOMETRY (EM);  Surgeon: San Sandor GAILS, DO;  Location: [facility];  Service: Gastroenterology;  Laterality: N/A;   GASTRIC BYPASS  2021   *approximately   LAPAROSCOPIC OVARIAN CYSTECTOMY Left 03/11/2016   Procedure: LAPAROSCOPIC OVARIAN CYSTECTOMY;  Surgeon: Shanda SHAUNNA Muscat, MD;  Location: [facility];  Service: Gynecology;  Laterality: Left;   LAPAROSCOPIC VAGINAL HYSTERECTOMY WITH SALPINGECTOMY Bilateral 03/11/2016   Procedure: LAPAROSCOPIC ASSISTED VAGINAL HYSTERECTOMY WITH SALPINGECTOMY;  Surgeon: Shanda SHAUNNA Muscat, MD;  Location: [facility];  Service: Gynecology;  Laterality: Bilateral;   PH IMPEDANCE STUDY  09/05/2019   Procedure: PH IMPEDANCE STUDY;  Surgeon: San Sandor GAILS, DO;  Location: [facility];  Service: Gastroenterology;;   TUBAL LIGATION     UPPER GASTROINTESTINAL ENDOSCOPY  10/11/2019   06/2019    Family History  Adopted: Yes  Problem Relation Age of Onset   Asthma Mother    Heart Problems Mother    Migraines Mother    COPD Mother    Heart attack Mother    Thyroid  disease Mother    Diabetes Father    Peptic Ulcer Father    Heart Problems Father    COPD Father    Heart attack Father    Dementia Maternal Grandmother    Heart Problems Maternal Grandfather    Heart attack Maternal Grandfather    Diabetes Paternal Grandmother    Diabetes Paternal Grandfather    Other Son        Growing pains   Colon cancer Neg Hx    Colon polyps Neg Hx    Esophageal cancer Neg Hx    Rectal cancer Neg Hx     Stomach cancer Neg Hx     Social History   Socioeconomic History   Marital status: Married    Spouse name: Not on file   Number of children: 1   Years of education: GED   Highest education level: Some college, no degree  Occupational History   Occupation: Lawyer  Tobacco Use   Smoking status: Former    Current packs/day: 0.00    Average packs/day: 0.3 packs/day for 17.0 years (4.3 ttl pk-yrs)    Types: Cigarettes    Start date: 03/02/1996    Quit  date: 03/02/2013    Years since quitting: 11.4   Smokeless tobacco: Never  Vaping Use   Vaping status: Never Used  Substance and Sexual Activity   Alcohol use: Yes    Comment: occasional   Drug use: Not Currently    Types: Marijuana    Comment: former - 6 yrs ago   Sexual activity: Not Currently    Birth control/protection: Surgical  Other Topics Concern   Not on file  Social History Narrative   She lives w/ her fiance'. She has one son.    Highest level of education:  12th grade, did not graduate. Currently back in school.   Right-handed   Caffeine : 1 cup of coffee some mornings      Social History      Diet? No       Do you drink/eat things with caffeine ? yes      Marital status?           Single                          What year were you married?       Do you live in a house, apartment, assisted living, condo, trailer, etc.? house      Is it one or more stories?     How many persons live in your home?  2      Do you have any pets in your home? (please list)   No       Highest level of education completed? Some college       Current or past profession:       Do you exercise?       Yes                                Type & how often? Daily       Advanced Directives      Do you have a living will? No       Do you have a DNR form?                                  If not, do you want to discuss one? No       Do you have signed POA/HPOA for forms?  No       Functional Status      Do you have difficulty  bathing or dressing yourself? No       Do you have difficulty preparing food or eating? No       Do you have difficulty managing your medications? No       Do you have difficulty managing your finances? No       Do you have difficulty affording your medications? No       Social Drivers of Corporate investment banker Strain: Low Risk  (01/24/2024)   Received from [facility]   Overall Financial Resource Strain (CARDIA)    Difficulty of Paying Living Expenses: Not very hard  Food Insecurity: No Food Insecurity (01/24/2024)   Received from [facility]   Hunger Vital Sign    Within the past 12 months, you worried that your food would run out before you got the money to buy more.: Never true    Within the past  12 months, the food you bought just didn't last and you didn't have money to get more.: Never true  Transportation Needs: Unknown (01/24/2024)   Received from [facility] - Transportation    In the past 12 months, has lack of transportation kept you from medical appointments or from getting medications?: No    Lack of Transportation (Non-Medical): Not on file  Physical Activity: Unknown (04/22/2023)   Exercise Vital Sign    Days of Exercise per Week: 0 days    Minutes of Exercise per Session: Not on file  Stress: Stress Concern Present (04/22/2023)   [employer] of Occupational Health - Occupational Stress Questionnaire    Feeling of Stress : Very much  Social Connections: Moderately Isolated (04/22/2023)   Social Connection and Isolation Panel    Frequency of Communication with Friends and Family: More than three times a week    Frequency of Social Gatherings with Friends and Family: More than three times a week    Attends Religious Services: Never    Database administrator or Organizations: No    Attends Engineer, structural: Not on file    Marital Status: Married  Catering manager Violence: Not At  Risk (08/16/2023)   Humiliation, Afraid, Rape, and Kick questionnaire    Fear of Current or Ex-Partner: No    Emotionally Abused: No    Physically Abused: No    Sexually Abused: No    Outpatient Medications Prior to Visit  Medication Sig Dispense Refill   acetaminophen  (TYLENOL ) 500 MG tablet Take 500 mg by mouth in the morning and at bedtime.     albuterol  (PROVENTIL ) (2.5 MG/3ML) 0.083% nebulizer solution Inhale 3 mLs (2.5 mg total) via nebulizer every 6 (six) hours as needed for wheezing or shortness of breath. 90 mL 0   albuterol  (VENTOLIN  HFA) 108 (90 Base) MCG/ACT inhaler Inhale 2 puffs into the lungs every 4 (four) hours as needed. 18 g 0   ALPRAZolam  (XANAX ) 0.25 MG tablet Take 0.25 mg by mouth 2 (two) times daily as needed for anxiety.     amphetamine -dextroamphetamine  (ADDERALL XR) 20 MG 24 hr capsule Take 20 mg by mouth daily.     chlorthalidone  (HYGROTON ) 25 MG tablet Take 1 tablet (25 mg total) by mouth daily. Pt needs to schedule appt. Pt can get a 90 day supply after office visit - 3rd and final attempt 15 tablet 0   cyclobenzaprine  (FLEXERIL ) 10 MG tablet Take 1 tablet (10 mg total) by mouth 2 (two) times daily as needed for muscle spasms. 20 tablet 0   fluticasone  (FLONASE ) 50 MCG/ACT nasal spray Place 1 spray into both nostrils 2 (two) times daily. 16 g 2   ondansetron  (ZOFRAN -ODT) 4 MG disintegrating tablet Take 4 mg by mouth every 8 (eight) hours as needed.     QUEtiapine  (SEROQUEL ) 100 MG tablet Take 1 tablet (100 mg total) by mouth at bedtime. 90 tablet 3   rizatriptan  (MAXALT ) 5 MG tablet Take 5 mg by mouth as needed for migraine. May repeat in 2 hours if needed     tiZANidine  (ZANAFLEX ) 4 MG tablet Take 1 tablet (4 mg total) by mouth every 8 (eight) hours as needed for muscle spasms. 20 tablet 0   topiramate  (TOPAMAX ) 25 MG tablet Take 1 tablet (25 mg total) by mouth 2 (two) times daily. Start with 25 mg daily x 1 week, then go to 25 mg BID. 60 tablet 1    Facility-Administered  Medications Prior to Visit  Medication Dose Route Frequency Provider Last Rate Last Admin   nitroGLYCERIN  (NITROSTAT ) SL tablet 0.4 mg  0.4 mg Sublingual Q5 min PRN Ngetich, Dinah C, NP   0.4 mg at 09/14/19 1621    Allergies  Allergen Reactions   Aspirin Anaphylaxis and Hives        Nsaids Itching and Swelling    ROS See HPI    Objective:    Physical Exam Constitutional:      General: She is not in acute distress.    Appearance: She is not ill-appearing, toxic-appearing or diaphoretic.  HENT:     Head: Normocephalic and atraumatic.     Right Ear: Tympanic membrane, ear canal and external ear normal.     Left Ear: Tympanic membrane, ear canal and external ear normal.     Nose: Nose normal. No congestion.     Mouth/Throat:     Mouth: Mucous membranes are moist.     Pharynx: Oropharynx is clear.  Eyes:     Extraocular Movements: Extraocular movements intact.     Right eye: Normal extraocular motion.     Left eye: Normal extraocular motion.     Conjunctiva/sclera: Conjunctivae normal.     Pupils: Pupils are equal, round, and reactive to light.  Neck:     Thyroid : No thyroid  mass or thyromegaly.     Vascular: No carotid bruit or JVD.  Cardiovascular:     Rate and Rhythm: Normal rate and regular rhythm.     Pulses: Normal pulses.          Radial pulses are 2+ on the right side and 2+ on the left side.       Dorsalis pedis pulses are 2+ on the right side and 2+ on the left side.     Heart sounds: Normal heart sounds, S1 normal and S2 normal. No murmur heard.    No friction rub. No gallop.  Pulmonary:     Effort: Pulmonary effort is normal. No respiratory distress.     Breath sounds: Normal breath sounds.  Abdominal:     General: Bowel sounds are normal. There is no distension.     Palpations: Abdomen is soft.     Tenderness: There is no abdominal tenderness. There is no guarding.  Musculoskeletal:        General: Normal range of motion.      Cervical back: Full passive range of motion without pain and normal range of motion. No edema or erythema.     Right lower leg: No edema.     Left lower leg: No edema.  Lymphadenopathy:     Cervical: No cervical adenopathy.  Skin:    General: Skin is warm and dry.     Capillary Refill: Capillary refill takes less than 2 seconds.  Neurological:     General: No focal deficit present.     Mental Status: She is alert and oriented to person, place, and time.     Cranial Nerves: No cranial nerve deficit.     Motor: No weakness.     Coordination: Coordination normal.     Gait: Gait normal.     Deep Tendon Reflexes: Reflexes normal.  Psychiatric:        Mood and Affect: Mood normal.        Behavior: Behavior normal.        Thought Content: Thought content normal.      LMP 02/19/2016  Wt Readings from Last 3 Encounters:  06/21/24 236 lb 15.9 oz (107.5 kg)  06/13/24 237 lb (107.5 kg)  06/08/24 237 lb (107.5 kg)       Assessment & Plan:   Problem List Items Addressed This Visit     Anemia   Hx IDA, Update CBC today.      Annual visit for general adult medical examination without abnormal findings - Primary   Chronic low back pain with sciatica   X-ray lumbar pending. Encouraged moist heat and gentle stretching as tolerated. May try NSAIDs and prescription meds as directed and report if symptoms worsen or seek immediate care.  Consider PT or Ortho referral. Reviewed PCP notes under this problem and see they have discussed patient may need referral to pain management.       Common migraine with intractable migraine   Rx-Topamax  25 mg twice daily. MAAXALT as needed. Patient has been seen by neurology in the past, consider referral to neurology if no improvements.       Essential hypertension, benign   Well controlled, no changes to meds. Encouraged heart healthy diet such as the DASH diet and exercise as tolerated.        History of epilepsy   Hx of epilepsy, Pt reports  previously prescribed Dilantin .  She is not currently on any antiepileptic medication. Last seizure occurred October 2022.  She recently states she has had auras similar to what she has previously experienced when she has had a seizure. -Referral placed to neurology        Preventative health care   HCM: -Mammogram: Last 2017, DUE- referral placed -Pap: Last 2020, WNL- Due- referral placed -DEXA: Consider earlier Dexa r/t smoking Hx - Colonoscopy: last 08/2019, Due 08/2029 - Immunizations: Covid-19, Shingles Vaccine due- Pt to receive at pharmacy      Vitamin D  deficiency   Supplement and monitor.      Other Visit Diagnoses       Family history of diabetes mellitus         Encounter for screening mammogram for malignant neoplasm of breast          FU 4 months     I am having [facility] maintain her albuterol , fluticasone , QUEtiapine , chlorthalidone , albuterol , acetaminophen , ondansetron , rizatriptan , tiZANidine , cyclobenzaprine , amphetamine -dextroamphetamine , ALPRAZolam , and topiramate . We will continue to administer nitroGLYCERIN .  No orders of the defined types were placed in this encounter.

## 2024-08-14 NOTE — Assessment & Plan Note (Signed)
 Supplement and monitor

## 2024-08-14 NOTE — Assessment & Plan Note (Signed)
 Hx of epilepsy, Pt reports previously prescribed Dilantin .  She is not currently on any antiepileptic medication. Last seizure occurred October 2022.  She recently states she has had auras similar to what she has previously experienced when she has had a seizure. -Referral placed to neurology

## 2024-08-14 NOTE — Assessment & Plan Note (Signed)
 X-ray lumbar pending. Encouraged moist heat and gentle stretching as tolerated. May try NSAIDs and prescription meds as directed and report if symptoms worsen or seek immediate care.  Consider PT or Ortho referral. Reviewed PCP notes under this problem and see they have discussed patient may need referral to pain management.

## 2024-08-14 NOTE — Assessment & Plan Note (Signed)
 Hx IDA, Update CBC today.

## 2024-08-14 NOTE — Assessment & Plan Note (Signed)
 HCM: -Mammogram: Last 2017, DUE- referral placed -Pap: Last 2020, WNL- Due- referral placed -DEXA: Consider earlier Dexa r/t smoking Hx - Colonoscopy: last 08/2019, Due 08/2029 - Immunizations: Covid-19, Shingles Vaccine due- Pt to receive at pharmacy

## 2024-08-17 ENCOUNTER — Ambulatory Visit: Payer: Self-pay

## 2024-08-21 ENCOUNTER — Ambulatory Visit: Payer: Self-pay | Admitting: Student

## 2024-08-21 DIAGNOSIS — I10 Essential (primary) hypertension: Secondary | ICD-10-CM

## 2024-08-21 DIAGNOSIS — G8929 Other chronic pain: Secondary | ICD-10-CM

## 2024-08-21 DIAGNOSIS — Z0000 Encounter for general adult medical examination without abnormal findings: Secondary | ICD-10-CM

## 2024-08-21 DIAGNOSIS — Z8669 Personal history of other diseases of the nervous system and sense organs: Secondary | ICD-10-CM

## 2024-08-21 DIAGNOSIS — Z1231 Encounter for screening mammogram for malignant neoplasm of breast: Secondary | ICD-10-CM

## 2024-08-21 DIAGNOSIS — E559 Vitamin D deficiency, unspecified: Secondary | ICD-10-CM

## 2024-08-21 DIAGNOSIS — Z833 Family history of diabetes mellitus: Secondary | ICD-10-CM

## 2024-08-21 DIAGNOSIS — G43019 Migraine without aura, intractable, without status migrainosus: Secondary | ICD-10-CM

## 2024-08-21 DIAGNOSIS — D649 Anemia, unspecified: Secondary | ICD-10-CM

## 2024-08-24 ENCOUNTER — Ambulatory Visit: Payer: Self-pay

## 2024-08-26 ENCOUNTER — Other Ambulatory Visit (HOSPITAL_COMMUNITY): Payer: Self-pay

## 2024-08-27 ENCOUNTER — Other Ambulatory Visit: Payer: Self-pay

## 2024-09-10 ENCOUNTER — Encounter: Payer: Self-pay | Admitting: Student

## 2024-09-10 DIAGNOSIS — F3341 Major depressive disorder, recurrent, in partial remission: Secondary | ICD-10-CM

## 2024-09-10 DIAGNOSIS — G8929 Other chronic pain: Secondary | ICD-10-CM

## 2024-09-10 DIAGNOSIS — G4733 Obstructive sleep apnea (adult) (pediatric): Secondary | ICD-10-CM

## 2024-09-10 DIAGNOSIS — F900 Attention-deficit hyperactivity disorder, predominantly inattentive type: Secondary | ICD-10-CM

## 2024-09-13 ENCOUNTER — Encounter (HOSPITAL_COMMUNITY): Payer: Self-pay

## 2024-09-13 ENCOUNTER — Other Ambulatory Visit: Payer: Self-pay

## 2024-09-13 ENCOUNTER — Emergency Department (HOSPITAL_COMMUNITY)

## 2024-09-13 ENCOUNTER — Emergency Department (HOSPITAL_COMMUNITY)
Admission: EM | Admit: 2024-09-13 | Discharge: 2024-09-13 | Disposition: A | Discharge status: Home / Self Care | Attending: Emergency Medicine | Admitting: Emergency Medicine

## 2024-09-13 DIAGNOSIS — W010XXA Fall on same level from slipping, tripping and stumbling without subsequent striking against object, initial encounter: Secondary | ICD-10-CM | POA: Diagnosis not present

## 2024-09-13 DIAGNOSIS — M25472 Effusion, left ankle: Secondary | ICD-10-CM | POA: Diagnosis not present

## 2024-09-13 DIAGNOSIS — Y990 Civilian activity done for income or pay: Secondary | ICD-10-CM | POA: Insufficient documentation

## 2024-09-13 DIAGNOSIS — S8992XA Unspecified injury of left lower leg, initial encounter: Secondary | ICD-10-CM | POA: Insufficient documentation

## 2024-09-13 NOTE — ED Provider Notes (Signed)
 Victoria Moore is a 48 y.o. female.   HPI      Victoria Moore is a 48 y.o. female who presents to the Emergency Department for evaluation of left knee injury that occurred earlier today at work.  States that she slipped and fell landing on on the front of her left knee.  She felt a pop in her knee.  She continued to work but noticed increasing pain of her knee and some swelling to her ankle.  She denies any ankle injury or pain of her ankle.  She was given 800 mg of ibuprofen  shortly after the incident occurred but states that she is allergic to NSAIDs.  She was unaware that ibuprofen  was an NSAID.  Injury occurred 2 hours ago.  She endorses having some itching but she denies rash, and swelling of her face lips or tongue.  She denies other injuries including head injury, neck hip or back pain.  No numbness or weakness of the extremity.   Prior to Admission medications   Medication Sig Start Date End Date Taking? Authorizing Provider  acetaminophen  (TYLENOL ) 500 MG tablet Take 500 mg by mouth in the morning and at bedtime.    [provider]  albuterol  (PROVENTIL ) (2.5 MG/3ML) 0.083% nebulizer solution Inhale 3 mLs (2.5 mg total) via nebulizer every 6 (six) hours as needed for wheezing or shortness of breath. 01/24/23   Melvenia Manus BRAVO, MD  albuterol  (VENTOLIN  HFA) 108 (90 Base) MCG/ACT inhaler Inhale 2 puffs into the lungs every 4 (four) hours as needed. 12/06/23   Stuart Vernell Norris, PA-C  ALPRAZolam  (XANAX ) 0.25 MG tablet Take 0.25 mg by mouth 2 (two) times daily as needed for anxiety.    [provider]  amphetamine -dextroamphetamine  (ADDERALL XR) 20 MG 24 hr capsule Take 20 mg by mouth daily.    [provider]  chlorthalidone  (HYGROTON ) 25 MG tablet Take 1 tablet (25 mg total) by mouth  daily. Pt needs to schedule appt. Pt can get a 90 day supply after office visit - 3rd and final attempt 08/22/23   Parthenia Olivia HERO, PA-C  cyclobenzaprine  (FLEXERIL ) 10 MG tablet Take 1 tablet (10 mg total) by mouth 2 (two) times daily as needed for muscle spasms. 07/18/24   Leath-Warren, Etta PARAS, NP  fluticasone  (FLONASE ) 50 MCG/ACT nasal spray Place 1 spray into both nostrils 2 (two) times daily. 03/05/23   Stuart Vernell Norris, PA-C  ondansetron  (ZOFRAN -ODT) 4 MG disintegrating tablet Take 4 mg by mouth every 8 (eight) hours as needed. 01/28/24   [provider]  QUEtiapine  (SEROQUEL ) 100 MG tablet Take 1 tablet (100 mg total) by mouth at bedtime. 06/28/23   Golda Lynwood PARAS, MD  rizatriptan  (MAXALT ) 5 MG tablet Take 5 mg by mouth as needed for migraine. May repeat in 2 hours if needed    [provider]  tiZANidine  (ZANAFLEX ) 4 MG tablet Take 1 tablet (4 mg total) by mouth every 8 (eight) hours as needed for muscle spasms. 06/25/24   Leath-Warren, Etta PARAS, NP  topiramate  (TOPAMAX ) 25 MG tablet Take 1 tablet (25 mg total) by mouth 2 (two) times daily. Start with 25 mg daily x 1 week, then go to 25 mg BID. 07/26/24   Yacopino, Jessica L, NP    Allergies: Aspirin and Nsaids  Review of Systems  Constitutional:  Negative for chills and fever.  Gastrointestinal:  Negative for nausea and vomiting.  Musculoskeletal:  Positive for arthralgias (left knee pain). Negative for back pain and neck pain.  Skin:  Negative for color change, pallor and wound.  Neurological:  Negative for dizziness, weakness, numbness and headaches.    Updated Vital Signs BP 123/78 (BP Location: Right Arm)   Pulse 72   Temp 97.8 F (36.6 C) (Temporal)   Resp 16   Ht 5' 10 (1.778 m)   Wt 107.5 kg   LMP 02/19/2016   SpO2 100%   BMI 34.01 kg/m   Physical Exam Vitals and nursing note reviewed.  Constitutional:      General: She is not in acute distress.    Appearance: Normal appearance. She is not  toxic-appearing.  HENT:     Mouth/Throat:     Mouth: Mucous membranes are moist.     Pharynx: Oropharynx is clear. No posterior oropharyngeal erythema.  Eyes:     Conjunctiva/sclera: Conjunctivae normal.  Cardiovascular:     Rate and Rhythm: Normal rate and regular rhythm.     Pulses: Normal pulses.  Pulmonary:     Effort: Pulmonary effort is normal.  Musculoskeletal:        General: Tenderness present. No swelling or deformity.     Left knee: No swelling, effusion or crepitus. Normal range of motion. Tenderness present over the patellar tendon. Normal alignment. Normal pulse.     Right lower leg: No edema.     Left lower leg: No edema.     Comments: Diffuse tenderness to the anterior left knee.  No palpable effusion.  No high riding patella.  Patellar tendon appears intact.  No laxity with valgus and varus stress.  Patient has full range of motion of the left ankle, no bony deformity, I do not appreciate any significant edema.  Skin:    General: Skin is warm.     Capillary Refill: Capillary refill takes less than 2 seconds.     Findings: No bruising or erythema.  Neurological:     General: No focal deficit present.     Mental Status: She is alert.     Sensory: No sensory deficit.     Motor: No weakness.     (all labs ordered are listed, but only abnormal results are displayed) Labs Reviewed - No data to display  EKG: None  Radiology: DG Ankle Complete Left Result Date: 09/13/2024 CLINICAL DATA:  Fall EXAM: LEFT ANKLE COMPLETE - 3+ VIEW COMPARISON:  None Available. FINDINGS: Probable old healed distal fibular fracture. No acute fracture, subluxation or dislocation. IMPRESSION: Negative. Electronically Signed   By: Franky Crease M.D.   On: 09/13/2024 14:00   DG Knee Complete 4 Views Left Result Date: 09/13/2024 CLINICAL DATA:  Fall EXAM: LEFT KNEE - COMPLETE 4+ VIEW COMPARISON:  03/05/2024 FINDINGS: No evidence of fracture, dislocation, or joint effusion. No evidence of  arthropathy or other focal bone abnormality. Soft tissues are unremarkable. IMPRESSION: Negative. Electronically Signed   By: Franky Crease M.D.   On: 09/13/2024 13:59     Procedures   Medications Ordered in the ED - No data to display                                  Medical Decision Making Patient here for evaluation of mechanical fall that occurred at work earlier today.  States that she slipped and fell landing on the anterior portion of her left knee.  Denies any head injury or LOC.  She does not take blood thinners.  She continued to work after the initial fall but here because of continued pain of her knee.  She also endorses having some swelling of her ankle but denies any ankle injury or ankle pain.  No bruising or palpable effusion on exam.  No obvious ligamentous instability.  She states that she took an ibuprofen  at work soon after the incident occurred states she has a history to NSAID she did not know ibuprofen  was an NSAID she endorses having some itching but denies rash or swelling of her face lips or tongue, no nausea vomiting or shortness of breath.  I suspect this is knee injury related to a sprain.  Fracture, dislocation ligamentous injury also considered.  No clinical findings suggestive of an anaphylaxis  Amount and/or Complexity of Data Reviewed Radiology: ordered.    Details: X-ray of the left knee without acute bony finding.  X-ray of the left ankle also negative for acute bony injury, there is probable old distal fibula fracture that is healed Discussion of management or test interpretation with external provider(s): Discussed x-ray findings with the patient, she is agreeable to RICE therapy and close outpatient follow-up with orthopedics in 1 week if not improved.  Knee brace applied and crutches given.  No concerning symptoms for anaphylactic reaction and medication was taken more than 2 hours ago.  I discussed medications that are considered to be NSAIDs and  recommended that she avoid those.  She is agreeable to Tylenol  as her directed        Final diagnoses:  Injury of left knee, initial encounter    ED Discharge Orders     None          Herlinda Milling, PA-C 09/13/24 1431    Cleotilde Rogue, MD 09/14/24 [phone]

## 2024-09-13 NOTE — ED Notes (Signed)
 Pt fitted for brace and crutches

## 2024-09-13 NOTE — Discharge Instructions (Signed)
 Elevate and apply ice packs on and off to your knee.  Use the brace and crutches when standing or walking.  You may remove the brace at bedtime bathing and while at rest.  You may take Tylenol  (acetaminophen ) as directed if needed for pain.  Please follow-up with the orthopedic provider listed in 1 week if your symptoms are not improving.  You may call the office to make an appointment.  Return to the emergency department if you develop any new or worsening symptoms.

## 2024-09-13 NOTE — ED Triage Notes (Signed)
 Pt arrived via POV from work following a fall. Pt reports she missed her step while stepping off of a step ladder and reports landing on her left knee. Pt reports her ankle has also began to swell. Pt denies LOC, denies blood thinners, but does report she heard a pop sound.

## 2024-09-13 NOTE — ED Triage Notes (Signed)
 Pt reports taking 800mg  Ibuprofen  PTA. Pt realized she is allergic to Nsaids.

## 2024-09-24 ENCOUNTER — Other Ambulatory Visit: Payer: Self-pay

## 2024-09-24 ENCOUNTER — Other Ambulatory Visit (HOSPITAL_COMMUNITY): Payer: Self-pay

## 2024-09-24 ENCOUNTER — Other Ambulatory Visit: Payer: Self-pay | Admitting: Student

## 2024-09-24 DIAGNOSIS — G43019 Migraine without aura, intractable, without status migrainosus: Secondary | ICD-10-CM

## 2024-09-24 MED ORDER — TOPIRAMATE 25 MG PO TABS
25.0000 mg | ORAL_TABLET | Freq: Two times a day (BID) | ORAL | 1 refills | Status: AC
  Filled 2024-09-24: qty 60, 30d supply, fill #0

## 2024-09-24 MED ORDER — QUETIAPINE FUMARATE 100 MG PO TABS: 100.0000 mg | ORAL_TABLET | Freq: Every day | ORAL | 2 refills | Status: DC

## 2024-09-24 NOTE — Addendum Note (Signed)
 Addended by: WHEELER HARLENE CROME on: 09/24/2024 01:51 PM   Modules accepted: Orders

## 2024-09-28 ENCOUNTER — Ambulatory Visit: Admitting: Student

## 2024-10-02 ENCOUNTER — Ambulatory Visit

## 2024-10-02 NOTE — Progress Notes (Deleted)
   Acute Office Visit  Subjective:     Patient ID: Victoria Moore, female    DOB: 1976-03-30, 48 y.o.   MRN: [id number]  No chief complaint on file.   HPI Patient is in today for ***    Had total abdominal hysterectomy  ROCEDURE:Procedure(s) (LRB): LAPAROSCOPIC  SALPINGECTOMY (Bilateral) LAPAROSCOPIC OVARIAN CYSTECTOMY (Left)  TOTAL VAGINAL HYSTERECTOMY   SURGEON:HAYGOOD,VANESSA P   ASSIST:Victoria Moore certified physician assistant  ROS      Objective:    LMP 02/19/2016  {Vitals History (Optional):23777}  Physical Exam  No results found for any visits on 10/03/24.      Assessment & Plan:   Problem List Items Addressed This Visit   None  , a pelvic ultrasound is indicated to confirm the diagnosis and guide next steps.  No orders of the defined types were placed in this encounter.   No follow-ups on file.  Viraj Liby L Jermon Chalfant, NP

## 2024-10-03 ENCOUNTER — Ambulatory Visit: Admitting: Student

## 2024-10-03 NOTE — Progress Notes (Signed)
 No show

## 2024-10-04 ENCOUNTER — Ambulatory Visit

## 2024-10-05 ENCOUNTER — Ambulatory Visit: Admitting: Student

## 2024-10-10 ENCOUNTER — Other Ambulatory Visit (HOSPITAL_COMMUNITY)
Admission: RE | Admit: 2024-10-10 | Discharge: 2024-10-10 | Disposition: A | Discharge status: Home / Self Care | Source: Ambulatory Visit | Attending: Student | Admitting: Student

## 2024-10-10 ENCOUNTER — Ambulatory Visit: Admitting: Student

## 2024-10-10 VITALS — BP 153/80 | HR 54 | Temp 98.3°F | Ht 70.0 in | Wt 232.0 lb

## 2024-10-10 DIAGNOSIS — N898 Other specified noninflammatory disorders of vagina: Secondary | ICD-10-CM | POA: Diagnosis present

## 2024-10-10 DIAGNOSIS — Z780 Asymptomatic menopausal state: Secondary | ICD-10-CM | POA: Diagnosis not present

## 2024-10-10 LAB — POC URINALSYSI DIPSTICK (AUTOMATED)
Blood, UA: NEGATIVE
Glucose, UA: NEGATIVE
Ketones, UA: 15
Leukocytes, UA: NEGATIVE
Nitrite, UA: NEGATIVE
Protein, UA: NEGATIVE
Spec Grav, UA: 1.025 (ref 1.010–1.025)
Urobilinogen, UA: 0.2 U/dL
pH, UA: 5 (ref 5.0–8.0)

## 2024-10-10 MED ORDER — FLUCONAZOLE 150 MG PO TABS: 150.0000 mg | ORAL_TABLET | Freq: Every day | ORAL | 0 refills | Status: AC

## 2024-10-10 NOTE — Progress Notes (Signed)
 No chief complaint on file.   Victoria Moore is a 48 y.o. female here for vaginal itching. History of Present Illness Victoria Moore is a 48 year old female who presents with vaginal itchiness.  She has experienced vaginal itchiness for the past few days, initially severe but improving with increased water intake. She has no new sexual partners and denies vaginal discharge. She is postmenopausal. Denies Hx recurrent UTI.  There is no blood in urine or burning with urination. She has no history of sexually transmitted diseases.   Patient denies fever, chills, SOB, CP, palpitations, dyspnea, edema, HA, vision changes, N/V/D, abdominal pain, urinary symptoms, rash, weight changes, and recent illness or hospitalizations.    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 Fibromyalgia, doubt sarcoid    Asthma    Blood transfusion without reported diagnosis    Chest wall pain 05/26/2018   Chronic kidney disease    Chronic low back pain with sciatica 09/15/2018   Common migraine with intractable migraine 10/07/2016   Cough variant asthma vs UACS  10/23/2016   - Spirometry 04/15/2016  Very truncated exp loop effort dep portion only  - Allergy  profile 10/22/2016 >  Eos 0.2/  IgE  52 RAST POS grass/trees/ragweed  10/22/2016  After extensive coaching HFA effectiveness =    75% try duelra 100 2bid > improved   Daytime sleepiness 05/26/2018   Decreased pedal pulses 10/04/2018   Depression    Depression, major, single episode, moderate (HCC) 05/26/2018   Diabetes (HCC)    Dyspnea 04/15/2016   04/15/2016  Walked RA x 3 laps @ 185 ft each stopped due to  End of study, nl pace, no sob or desat    - Spirometry 04/15/2016  Very truncated exp loop effort dep portion only  - 10/22/2016  Walked RA x 3 laps @ 185 ft  each stopped due to  End of study, slow pace, min sob/ no desat - full pfts rec 10/22/2016 >>>       Edema    Essential hypertension, benign 11/15/2016   Fibroids 03/11/2016   Frequency of urination 09/11/2018   GERD (gastroesophageal reflux disease)    Herniated lumbar intervertebral disc    Hiatal hernia    Hilar adenopathy    Hypertension    Hypokalemia 06/28/2018   Impaired fasting blood sugar 09/11/2018   Iron deficiency    Leg pain 12/07/2018   Low libido 11/27/2018   Migraine    Mild sleep apnea 08/03/2018   HST 06/20/18  AHI  8.1 / snoring with 02 nadir 80% >  08/03/2018 rec sleep medicine consultation    Morbid (severe) obesity due to excess calories (HCC) 10/23/2016   Personal history of sarcoidosis 05/26/2018   Prolonged capillary refill time    Right leg swelling 08/18/2018   Sarcoidosis    personal history of   Seizures (HCC)    history of    Sleep apnea    Spondylosis    Vitamin D  deficiency      BP (!) 153/80   Pulse (!) 54   Temp 98.3 F (36.8 C)   Ht 5' 10 (1.778 m)   Wt 232 lb (105.2 kg)   LMP 02/19/2016  SpO2 97%   BMI 33.29 kg/m  General: Awake, alert, appears stated age Heart: RRR Lungs: CTAB, normal respiratory effort, no accessory muscle usage Abd: BS+, soft, NT, ND, no masses or organomegaly MSK: No CVA tenderness,  Psych: Age appropriate judgment and insight  Vaginal itching - Plan: POCT Urinalysis Dipstick (Automated), Cervicovaginal ancillary only( [place]), Urine Culture, fluconazole  (DIFLUCAN ) 150 MG tablet  Postmenopausal estrogen deficiency   Vaginal pruritus Mild itching without discharge or bleeding.  Empirical treatment with Diflucan  planned. - Rx- Diflucan  - Vaginal swab and urine culture pending, will treat based on results -Advised avoiding scented products, wearing cotton breathable underwear, and keeping area clean and dry.    Postmenopausal state with associated vaginal dryness If no sources of infection found,  postmenopausal state may contribute to vaginal dryness and itching due to estrogen loss. - Discuss vaginal dryness and potential use of intravaginal estrogen cream with gynecologist during Pap smear appointment.  Stay hydrated. Seek immediate care if pt starts to develop fevers, new/worsening symptoms, uncontrollable N/V. F/u prn. The patient voiced understanding and agreement to the plan.  Harlene LITTIE Jolly, DNP, AGNP-C 10/10/24 12:03 PM

## 2024-10-11 ENCOUNTER — Ambulatory Visit (INDEPENDENT_AMBULATORY_CARE_PROVIDER_SITE_OTHER)

## 2024-10-11 VITALS — BP 120/84 | Ht 70.0 in | Wt 232.0 lb

## 2024-10-11 DIAGNOSIS — M5442 Lumbago with sciatica, left side: Secondary | ICD-10-CM | POA: Diagnosis not present

## 2024-10-11 DIAGNOSIS — G8929 Other chronic pain: Secondary | ICD-10-CM

## 2024-10-11 DIAGNOSIS — M5441 Lumbago with sciatica, right side: Secondary | ICD-10-CM | POA: Diagnosis not present

## 2024-10-11 LAB — CERVICOVAGINAL ANCILLARY ONLY
Bacterial Vaginitis (gardnerella): POSITIVE — AB
Candida Glabrata: NEGATIVE
Candida Vaginitis: NEGATIVE
Chlamydia: NEGATIVE
Comment: NEGATIVE
Comment: NEGATIVE
Comment: NEGATIVE
Comment: NEGATIVE
Comment: NEGATIVE
Comment: NORMAL
Neisseria Gonorrhea: NEGATIVE
Trichomonas: NEGATIVE

## 2024-10-11 LAB — URINE CULTURE
MICRO NUMBER:: 17102764
Result:: NO GROWTH
SPECIMEN QUALITY:: ADEQUATE

## 2024-10-11 MED ORDER — CELECOXIB 200 MG PO CAPS: 200.0000 mg | ORAL_CAPSULE | Freq: Two times a day (BID) | ORAL | 1 refills | Status: DC

## 2024-10-11 NOTE — Addendum Note (Signed)
 Addended by: CHARLES ROGUE A on: 10/11/2024 01:06 PM   Modules accepted: Level of Service

## 2024-10-11 NOTE — Progress Notes (Signed)
   Subjective:    Patient ID: Victoria Moore, female    DOB: 48 y.o., Jun 08, 1976   MRN: [id number]  Chief Complaint: Chronic low back pain  Discussed the use of AI scribe software for clinical note transcription with the patient, who gave verbal consent to proceed.  History of Present Illness Victoria Moore is a 48 year old female with chronic low back pain and sciatica who presents for evaluation of worsening symptoms.  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 - No prior lumbar spine surgery - History of herniated disc diagnosed via MRI two to three years ago - Previous MRIs in 2017 and 2021 showed mild bilateral L5-S1 neuroforaminal stenosis and disc bulges at L4-L5 and L5-S1 - Physical therapy five to six years ago was ineffective - No history of epidural or other injections for back pain  Lower extremity arthralgia - Significant discomfort in both knees attributed to arthritis  Medication use and response - Current medications include NSAIDs: meloxicam  and ibuprofen  - NSAIDs have not provided significant relief  Associated symptoms and constitutional signs - No fevers, chills, or unexpected weight loss - No loss of bowel or bladder control - Occasional urinary urgency without incontinence - No numbness around the perineal area     Objective:   Vitals:   10/11/24 1111  BP: 120/84    Lumbar Spine -Inspection: no swelling or skin changes -Palpation: TTP + midline @ L4-L5, + paraspinals -AROM/PROM: FROM in all planes of the low back -Strength: full hip flexion (L1/L2), knee extension (L3/4), ankle dorsiflexion (L4/5),  hip extension (L5/S1), knee flexion (L5/S1/S2) plantarflexion (S1/2). -Sensation: intact sensation over the medial femoral condyle (L3), patella (L4), lateral femoral condyle (L5), lateral malleolus (S1). -Reflexes: normal patellar (L3/4), hamstring (L5/S1), achilles (S1/2) reflexes, equal bilaterally -Special tests: - Straight Leg Raise, + Stork, - Slump test      Assessment & Plan:   Assessment & Plan Chronic low back pain with bilateral sciatica due to lumbar disc bulges and bilateral L5-S1 neuroforaminal stenosis   Chronic low back pain with bilateral sciatica has worsened over the past year. A 2021 MRI shows mild bilateral L5-S1 neuroforaminal stenosis and disc bulges at L4-L5 and L5-S1. Pain radiates down both legs, more severe on the left, with numbness and tingling extending to the knee and ankle. Previous NSAIDs and physical therapy were ineffective. Current symptoms suggest nerve involvement from disc bulges and stenosis. Order lumbar spine X-rays and prescribe NSAIDs for pain management. Refer to physical therapy. Consider re-imaging with MRI if symptoms do not improve. Consider referral for steroid injections if necessary. NSAIDs may not relieve all nerve pain; if ineffective, alternative medications targeting nerve pain will be considered. Physical therapy can benefit discogenic pain, and if symptoms do not improve, re-imaging and targeted steroid injections may be necessary.

## 2024-10-12 ENCOUNTER — Ambulatory Visit: Payer: Self-pay | Admitting: Student

## 2024-10-12 DIAGNOSIS — B9689 Other specified bacterial agents as the cause of diseases classified elsewhere: Secondary | ICD-10-CM

## 2024-10-12 MED ORDER — METRONIDAZOLE 0.75 % VA GEL: 1.0000 | Freq: Every day | VAGINAL | 0 refills | Status: AC

## 2024-10-14 ENCOUNTER — Ambulatory Visit
Admission: RE | Admit: 2024-10-14 | Discharge: 2024-10-14 | Disposition: A | Discharge status: Home / Self Care | Payer: Self-pay | Attending: Family Medicine | Admitting: Family Medicine

## 2024-10-14 VITALS — BP 153/101 | HR 75 | Temp 98.4°F | Resp 18

## 2024-10-14 DIAGNOSIS — R030 Elevated blood-pressure reading, without diagnosis of hypertension: Secondary | ICD-10-CM

## 2024-10-14 DIAGNOSIS — J069 Acute upper respiratory infection, unspecified: Secondary | ICD-10-CM | POA: Diagnosis not present

## 2024-10-14 LAB — POCT RAPID STREP A (OFFICE): Rapid Strep A Screen: NEGATIVE

## 2024-10-14 LAB — POC COVID19/FLU A&B COMBO
Covid Antigen, POC: NEGATIVE
Influenza A Antigen, POC: NEGATIVE
Influenza B Antigen, POC: NEGATIVE

## 2024-10-14 MED ORDER — PROMETHAZINE-DM 6.25-15 MG/5ML PO SYRP: 5.0000 mL | ORAL_SOLUTION | Freq: Four times a day (QID) | ORAL | 0 refills | Status: AC | PRN

## 2024-10-14 MED ORDER — AZELASTINE HCL 0.1 % NA SOLN: 1.0000 | Freq: Two times a day (BID) | NASAL | 0 refills | Status: AC

## 2024-10-14 NOTE — Discharge Instructions (Addendum)
 In addition to the prescribed medications, you may try Coricidin HBP, plain Mucinex , saline sinus rinses, humidifiers.  These over-the-counter medications will not elevate your blood pressure further.  Monitor your blood pressure at home over the next few days and follow-up with your primary care if your readings are not going back down to normal as they were a bit high here.  Follow-up for worsening or unresolving symptoms.

## 2024-10-14 NOTE — ED Provider Notes (Signed)
 [facility]    CSN: [id number] Arrival date & time: 10/14/24  1152      History   Chief Complaint Chief Complaint  Patient presents with   Sore Throat    Chest hurts Coughing Headache - Entered by patient   Cough   Headache    HPI Victoria Moore is a 48 y.o. female.   Patient presenting today with 2-day history of sore throat, cough, chills, headache, congestion, fatigue.  Denies fever, chest pain, shortness of breath, abdominal pain, vomiting, diarrhea.  So far trying over-the-counter cold and congestion medication with minimal relief.    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 Fibromyalgia, doubt sarcoid    Asthma    Blood transfusion without reported diagnosis    Chest wall pain 05/26/2018   Chronic kidney disease    Chronic low back pain with sciatica 09/15/2018   Common migraine with intractable migraine 10/07/2016   Cough variant asthma vs UACS  10/23/2016   - Spirometry 04/15/2016  Very truncated exp loop effort dep portion only  - Allergy  profile 10/22/2016 >  Eos 0.2/  IgE  52 RAST POS grass/trees/ragweed  10/22/2016  After extensive coaching HFA effectiveness =    75% try duelra 100 2bid > improved   Daytime sleepiness 05/26/2018   Decreased pedal pulses 10/04/2018   Depression    Depression, major, single episode, moderate (HCC) 05/26/2018   Diabetes (HCC)    Dyspnea 04/15/2016   04/15/2016  Walked RA x 3 laps @ 185 ft each stopped due to  End of study, nl pace, no sob or desat    - Spirometry 04/15/2016  Very truncated exp loop effort dep portion only  - 10/22/2016  Walked RA x 3 laps @ 185 ft each stopped due to  End of study, slow pace, min sob/ no desat - full pfts rec 10/22/2016 >>>       Edema    Essential hypertension, benign 11/15/2016   Fibroids  03/11/2016   Frequency of urination 09/11/2018   GERD (gastroesophageal reflux disease)    Herniated lumbar intervertebral disc    Hiatal hernia    Hilar adenopathy    Hypertension    Hypokalemia 06/28/2018   Impaired fasting blood sugar 09/11/2018   Iron deficiency    Leg pain 12/07/2018   Low libido 11/27/2018   Migraine    Mild sleep apnea 08/03/2018   HST 06/20/18  AHI  8.1 / snoring with 02 nadir 80% >  08/03/2018 rec sleep medicine consultation    Morbid (severe) obesity due to excess calories (HCC) 10/23/2016   Personal history of sarcoidosis 05/26/2018   Prolonged capillary refill time    Right leg swelling 08/18/2018   Sarcoidosis    personal history of   Seizures (HCC)    history of    Sleep apnea    Spondylosis    Vitamin D  deficiency     Patient Active Problem List   Diagnosis Date Noted   Postmenopausal estrogen deficiency 10/10/2024   Vaginal itching 10/10/2024   Preventative health care 08/14/2024   Annual visit for general adult medical examination without abnormal findings 08/10/2024   Chronic kidney disease    Obesity (BMI 30.0-34.9) 07/26/2024  Encounter to establish care 07/24/2024   Peripheral neuropathy 10/12/2022   MDD (major depressive disorder) 10/12/2022   History of epilepsy 09/22/2022   Asthmatic bronchitis 01/23/2021   Social anxiety disorder 10/03/2020   Attention deficit hyperactivity disorder (ADHD), 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   Mild episode of recurrent major depressive disorder 10/03/2020   S/P gastric bypass 05/12/2020   Intractable abdominal pain 04/27/2020   Benign intracranial hypertension 02/14/2020   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   Complex cyst of left ovary 02/01/2019   Sarcoidosis 02/01/2019   Leg pain 12/07/2018   Low libido 11/27/2018   Decreased pedal pulses 10/04/2018   Chronic low back pain with sciatica 09/15/2018   Impaired fasting blood sugar 09/11/2018   Mild sleep apnea 08/03/2018    Hypokalemia 06/28/2018   Medication side effect 06/22/2018   Vitamin D  deficiency 05/26/2018   Encounter for health maintenance examination with abnormal findings 05/26/2018   Depression, major, single episode, moderate (HCC) 05/26/2018   Daytime sleepiness 05/26/2018   GERD (gastroesophageal reflux disease) 05/26/2018   Essential hypertension, benign 11/15/2016   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/10/2016   Class 1 obesity with body mass index (BMI) of 32.0 to 32.9 in adult 10/23/2016   Common migraine with intractable migraine 10/07/2016   DOE (dyspnea on exertion) 04/15/2016   Seizures (HCC) 03/11/2016   Anemia 11/12/2014   Iron deficiency 11/12/2014    Past Surgical History:  Procedure Laterality Date   ABDOMINAL HYSTERECTOMY     ESOPHAGEAL MANOMETRY N/A 09/05/2019   Procedure: ESOPHAGEAL MANOMETRY (EM);  Surgeon: San Sandor GAILS, DO;  Location: [facility];  Service: Gastroenterology;  Laterality: N/A;   GASTRIC BYPASS  2021   *approximately   LAPAROSCOPIC OVARIAN CYSTECTOMY Left 03/11/2016   Procedure: LAPAROSCOPIC OVARIAN CYSTECTOMY;  Surgeon: Shanda SHAUNNA Muscat, MD;  Location: [facility];  Service: Gynecology;  Laterality: Left;   LAPAROSCOPIC VAGINAL HYSTERECTOMY WITH SALPINGECTOMY Bilateral 03/11/2016   Procedure: LAPAROSCOPIC ASSISTED VAGINAL HYSTERECTOMY WITH SALPINGECTOMY;  Surgeon: Shanda SHAUNNA Muscat, MD;  Location: [facility];  Service: Gynecology;  Laterality: Bilateral;   PH IMPEDANCE STUDY  09/05/2019   Procedure: PH IMPEDANCE STUDY;  Surgeon: San Sandor GAILS, DO;  Location: [facility];  Service: Gastroenterology;;   TUBAL LIGATION     UPPER GASTROINTESTINAL ENDOSCOPY  10/11/2019   06/2019    OB History     Gravida  1   Para      Term      Preterm      AB      Living         SAB      IAB      Ectopic      Multiple      Live Births               Home Medications    Prior to Admission medications   Medication Sig Start Date End Date Taking?  Authorizing Provider  azelastine (ASTELIN) 0.1 % nasal spray Place 1 spray into both nostrils 2 (two) times daily. Use in each nostril as directed 10/14/24  Yes Stuart Vernell Norris, PA-C  promethazine -dextromethorphan  (PROMETHAZINE -DM) 6.25-15 MG/5ML syrup Take 5 mLs by mouth 4 (four) times daily as needed. 10/14/24  Yes Stuart Vernell Norris, PA-C  acetaminophen  (TYLENOL ) 500 MG tablet Take 500 mg by mouth in the morning and at bedtime.    [provider]  albuterol  (PROVENTIL ) (2.5 MG/3ML) 0.083% nebulizer solution Inhale 3 mLs (2.5 mg total) via nebulizer every 6 (six)  hours as needed for wheezing or shortness of breath. 01/24/23   Melvenia Manus BRAVO, MD  albuterol  (VENTOLIN  HFA) 108 (90 Base) MCG/ACT inhaler Inhale 2 puffs into the lungs every 4 (four) hours as needed. 12/06/23   Stuart Vernell Norris, PA-C  ALPRAZolam  (XANAX ) 0.25 MG tablet Take 0.25 mg by mouth 2 (two) times daily as needed for anxiety.    [provider]  amphetamine -dextroamphetamine  (ADDERALL XR) 20 MG 24 hr capsule Take 20 mg by mouth daily.    [provider]  celecoxib (CELEBREX) 200 MG capsule Take 1 capsule (200 mg total) by mouth 2 (two) times daily. 10/11/24   Gottwalt, Redell A, DO  chlorthalidone  (HYGROTON ) 25 MG tablet Take 1 tablet (25 mg total) by mouth daily. Pt needs to schedule appt. Pt can get a 90 day supply after office visit - 3rd and final attempt 08/22/23   Parthenia Olivia HERO, PA-C  cyclobenzaprine  (FLEXERIL ) 10 MG tablet Take 1 tablet (10 mg total) by mouth 2 (two) times daily as needed for muscle spasms. 07/18/24   Leath-Warren, Etta PARAS, NP  fluconazole  (DIFLUCAN ) 150 MG tablet Take 1 tablet (150 mg total) by mouth daily. May repeat in 3 days if needed. 10/10/24   Yacopino, Jessica L, NP  fluticasone  (FLONASE ) 50 MCG/ACT nasal spray Place 1 spray into both nostrils 2 (two) times daily. 03/05/23   Stuart Vernell Norris, PA-C  metroNIDAZOLE  (METROGEL ) 0.75 % vaginal gel Place 1  Applicatorful vaginally at bedtime for 5 days. 10/12/24 10/17/24  Wheeler Harlene CROME, NP  ondansetron  (ZOFRAN -ODT) 4 MG disintegrating tablet Take 4 mg by mouth every 8 (eight) hours as needed. 01/28/24   [provider]  QUEtiapine  (SEROQUEL ) 100 MG tablet Take 1 tablet (100 mg total) by mouth at bedtime. 09/24/24   Yacopino, Jessica L, NP  rizatriptan  (MAXALT ) 5 MG tablet Take 5 mg by mouth as needed for migraine. May repeat in 2 hours if needed    [provider]  tiZANidine  (ZANAFLEX ) 4 MG tablet Take 1 tablet (4 mg total) by mouth every 8 (eight) hours as needed for muscle spasms. 06/25/24   Leath-Warren, Etta PARAS, NP  topiramate  (TOPAMAX ) 25 MG tablet Take 1 tablet (25 mg total) by mouth 2 (two) times daily. 09/24/24   Wheeler Harlene CROME, NP    Family History Family History  Adopted: Yes  Problem Relation Age of Onset   Asthma Mother    Heart Problems Mother    Migraines Mother    COPD Mother    Heart attack Mother    Thyroid  disease Mother    Diabetes Father    Peptic Ulcer Father    Heart Problems Father    COPD Father    Heart attack Father    Dementia Maternal Grandmother    Heart Problems Maternal Grandfather    Heart attack Maternal Grandfather    Diabetes Paternal Grandmother    Diabetes Paternal Grandfather    Other Son        Growing pains   Colon cancer Neg Hx    Colon polyps Neg Hx    Esophageal cancer Neg Hx    Rectal cancer Neg Hx    Stomach cancer Neg Hx     Social History Social History   Tobacco Use   Smoking status: Former    Current packs/day: 0.00    Average packs/day: 0.3 packs/day for 17.0 years (4.3 ttl pk-yrs)    Types: Cigarettes    Start date: 03/02/1996  Quit date: 03/02/2013    Years since quitting: 11.6    Passive exposure: Past   Smokeless tobacco: Never  Vaping Use   Vaping status: Never Used  Substance Use Topics   Alcohol use: Yes    Comment: occasional   Drug use: Not Currently    Types: Marijuana     Comment: former - 6 yrs ago     Allergies   Aspirin and Nsaids   Review of Systems Review of Systems Per HPI  Physical Exam Triage Vital Signs ED Triage Vitals [10/14/24 1159]  Encounter Vitals Group     BP (!) 153/101     Girls Systolic BP Percentile      Girls Diastolic BP Percentile      Boys Systolic BP Percentile      Boys Diastolic BP Percentile      Pulse Rate 75     Resp 18     Temp 98.4 F (36.9 C)     Temp Source Oral     SpO2 96 %     Weight      Height      Head Circumference      Peak Flow      Pain Score 10     Pain Loc      Pain Education      Exclude from Growth Chart    No data found.  Updated Vital Signs BP (!) 153/101 (BP Location: Right Arm)   Pulse 75   Temp 98.4 F (36.9 C) (Oral)   Resp 18   LMP 02/19/2016   SpO2 96%   Visual Acuity Right Eye Distance:   Left Eye Distance:   Bilateral Distance:    Right Eye Near:   Left Eye Near:    Bilateral Near:     Physical Exam Vitals and nursing note reviewed.  Constitutional:      Appearance: Normal appearance.  HENT:     Head: Atraumatic.     Right Ear: Tympanic membrane and external ear normal.     Left Ear: Tympanic membrane and external ear normal.     Nose: Rhinorrhea present.     Mouth/Throat:     Mouth: Mucous membranes are moist.     Pharynx: Posterior oropharyngeal erythema present.  Eyes:     Extraocular Movements: Extraocular movements intact.     Conjunctiva/sclera: Conjunctivae normal.  Cardiovascular:     Rate and Rhythm: Normal rate and regular rhythm.     Heart sounds: Normal heart sounds.  Pulmonary:     Effort: Pulmonary effort is normal.     Breath sounds: Normal breath sounds. No wheezing or rales.  Musculoskeletal:        General: Normal range of motion.     Cervical back: Normal range of motion and neck supple.  Skin:    General: Skin is warm and dry.  Neurological:     Mental Status: She is alert and oriented to person, place, and time.   Psychiatric:        Mood and Affect: Mood normal.        Thought Content: Thought content normal.      UC Treatments / Results  Labs (all labs ordered are listed, but only abnormal results are displayed) Labs Reviewed  POCT RAPID STREP A (OFFICE)  POC COVID19/FLU A&B COMBO    EKG   Radiology No results found.  Procedures Procedures (including critical care time)  Medications Ordered in UC Medications - No data to  display  Initial Impression / Assessment and Plan / UC Course  I have reviewed the triage vital signs and the nursing notes.  Pertinent labs & imaging results that were available during my care of the patient were reviewed by me and considered in my medical decision making (see chart for details).     Hypertensive in triage, otherwise vital signs within normal limits.  Rapid strep flu and COVID all negative.  Suspect viral respiratory infection.  Treat with Astelin, Phenergan  DM and discussed supportive over-the-counter medications and home care that would not further elevate blood pressure.  Continue checking blood pressures at home and follow-up with primary care if worsening or not resolving.  Final Clinical Impressions(s) / UC Diagnoses   Final diagnoses:  Viral URI with cough  Elevated blood pressure reading     Discharge Instructions      In addition to the prescribed medications, you may try Coricidin HBP, plain Mucinex , saline sinus rinses, humidifiers.  These over-the-counter medications will not elevate your blood pressure further.  Monitor your blood pressure at home over the next few days and follow-up with your primary care if your readings are not going back down to normal as they were a bit high here.  Follow-up for worsening or unresolving symptoms.    ED Prescriptions     Medication Sig Dispense Auth. Provider   azelastine (ASTELIN) 0.1 % nasal spray Place 1 spray into both nostrils 2 (two) times daily. Use in each nostril as directed  30 mL Stuart Vernell Norris, PA-C   promethazine -dextromethorphan  (PROMETHAZINE -DM) 6.25-15 MG/5ML syrup Take 5 mLs by mouth 4 (four) times daily as needed. 100 mL Stuart Vernell Norris, [place]      PDMP not reviewed this encounter.   Stuart Vernell Norris, PA-C 10/14/24 1253

## 2024-10-14 NOTE — ED Triage Notes (Signed)
 Pt being seen in UC for sore throat, productive cough, chills, headache, and decreased appetite since Friday. Pt denies fevers and sick contacts. Pt reports taking some otc medications with no relief.

## 2024-10-25 ENCOUNTER — Other Ambulatory Visit: Payer: Self-pay | Admitting: Student

## 2024-10-25 DIAGNOSIS — F3341 Major depressive disorder, recurrent, in partial remission: Secondary | ICD-10-CM

## 2024-11-02 ENCOUNTER — Other Ambulatory Visit: Payer: Self-pay

## 2024-11-02 ENCOUNTER — Ambulatory Visit
Admission: RE | Admit: 2024-11-02 | Discharge: 2024-11-02 | Disposition: A | Discharge status: Home / Self Care | Payer: Self-pay | Source: Ambulatory Visit

## 2024-11-02 ENCOUNTER — Encounter: Payer: Self-pay | Admitting: Emergency Medicine

## 2024-11-02 ENCOUNTER — Ambulatory Visit
Admission: RE | Admit: 2024-11-02 | Discharge: 2024-11-02 | Disposition: A | Discharge status: Home / Self Care | Source: Ambulatory Visit | Attending: Nurse Practitioner | Admitting: Nurse Practitioner

## 2024-11-02 DIAGNOSIS — J4541 Moderate persistent asthma with (acute) exacerbation: Secondary | ICD-10-CM | POA: Diagnosis not present

## 2024-11-02 DIAGNOSIS — J069 Acute upper respiratory infection, unspecified: Secondary | ICD-10-CM | POA: Diagnosis not present

## 2024-11-02 DIAGNOSIS — Z760 Encounter for issue of repeat prescription: Secondary | ICD-10-CM | POA: Diagnosis not present

## 2024-11-02 MED ORDER — BENZONATATE 100 MG PO CAPS: 100.0000 mg | ORAL_CAPSULE | Freq: Three times a day (TID) | ORAL | 0 refills | Status: DC | PRN

## 2024-11-02 MED ORDER — PREDNISONE 20 MG PO TABS: 40.0000 mg | ORAL_TABLET | Freq: Every day | ORAL | 0 refills | Status: AC

## 2024-11-02 MED ORDER — FLUTICASONE PROPIONATE HFA 110 MCG/ACT IN AERO: 1.0000 | INHALATION_SPRAY | Freq: Two times a day (BID) | RESPIRATORY_TRACT | 0 refills | Status: AC

## 2024-11-02 NOTE — Discharge Instructions (Addendum)
 You are having an exacerbation of asthma likely due to being out of the Flovent  and a viral upper respiratory infection.  Resume Flovent  and continue using albuterol  rescue inhaler every 4-6 hours as needed for wheezing or shortness of breath.  In addition, start taking oral prednisone  to help with lung inflammation.  You can take the cough pill every 8 hours as needed for cough.  Also recommend starting guaifenesin  600 mg twice daily for as long as you do have the congestion.  Symptoms should improve over the next few days.  Seek care if symptoms worsen in the ER.  If symptoms do not improve after 1 week, follow-up here or with your primary care provider.

## 2024-11-02 NOTE — ED Notes (Signed)
 Verified HR and Pulse Ox with portable pulse oximetry. HR 55, O2 100%. Provider aware.

## 2024-11-02 NOTE — ED Provider Notes (Signed)
 [facility]    CSN: [id number] Arrival date & time: 11/02/24  1008      History   Chief Complaint Chief Complaint  Patient presents with   Cough    HPI Victoria Moore is a 48 y.o. female.   Patient presents today for 2-day history of congested cough, shortness of breath, wheezing, chest tightness all that gets worse at nighttime.  Reports she ran out of her Flovent  for asthma 2 days ago and has been using rescue inhaler frequently which helps with symptoms temporarily.  She also endorses a runny nose, headache, and fatigue.  No fever, body aches or chills, stuffy nose, sore throat, ear pain, abdominal pain, nausea/vomiting, diarrhea, or significantly changed appetite.  In addition to the rescue Hailer, has been taking Mucinex  without improvement in symptoms.  Reports last use of albuterol  inhaler was just prior to arrival in urgent care today.    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 Fibromyalgia, doubt sarcoid    Asthma    Blood transfusion without reported diagnosis    Chest wall pain 05/26/2018   Chronic kidney disease    Chronic low back pain with sciatica 09/15/2018   Common migraine with intractable migraine 10/07/2016   Cough variant asthma vs UACS  10/23/2016   - Spirometry 04/15/2016  Very truncated exp loop effort dep portion only  - Allergy  profile 10/22/2016 >  Eos 0.2/  IgE  52 RAST POS grass/trees/ragweed  10/22/2016  After extensive coaching HFA effectiveness =    75% try duelra 100 2bid > improved   Daytime sleepiness 05/26/2018   Decreased pedal pulses 10/04/2018   Depression    Depression, major, single episode, moderate (HCC) 05/26/2018   Diabetes (HCC)    Dyspnea 04/15/2016   04/15/2016  Walked RA x 3 laps @ 185 ft each stopped due to  End of study,  nl pace, no sob or desat    - Spirometry 04/15/2016  Very truncated exp loop effort dep portion only  - 10/22/2016  Walked RA x 3 laps @ 185 ft each stopped due to  End of study, slow pace, min sob/ no desat - full pfts rec 10/22/2016 >>>       Edema    Essential hypertension, benign 11/15/2016   Fibroids 03/11/2016   Frequency of urination 09/11/2018   GERD (gastroesophageal reflux disease)    Herniated lumbar intervertebral disc    Hiatal hernia    Hilar adenopathy    Hypertension    Hypokalemia 06/28/2018   Impaired fasting blood sugar 09/11/2018   Iron deficiency    Leg pain 12/07/2018   Low libido 11/27/2018   Migraine    Mild sleep apnea 08/03/2018   HST 06/20/18  AHI  8.1 / snoring with 02 nadir 80% >  08/03/2018 rec sleep medicine consultation    Morbid (severe) obesity due to excess calories (HCC) 10/23/2016   Personal history of sarcoidosis 05/26/2018   Prolonged capillary refill time    Right leg swelling 08/18/2018   Sarcoidosis    personal history of   Seizures (HCC)    history of    Sleep apnea    Spondylosis    Vitamin D  deficiency  Patient Active Problem List   Diagnosis Date Noted   Postmenopausal estrogen deficiency 10/10/2024   Vaginal itching 10/10/2024   Preventative health care 08/14/2024   Annual visit for general adult medical examination without abnormal findings 08/10/2024   Chronic kidney disease    Obesity (BMI 30.0-34.9) 07/26/2024   Encounter to establish care 07/24/2024   Peripheral neuropathy 10/12/2022   MDD (major depressive disorder) 10/12/2022   History of epilepsy 09/22/2022   Asthmatic bronchitis 01/23/2021   Social anxiety disorder 10/03/2020   Attention deficit hyperactivity disorder (ADHD), 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   Mild episode of recurrent major depressive disorder 10/03/2020   S/P gastric bypass 05/12/2020   Intractable abdominal pain 04/27/2020   Benign intracranial hypertension 02/14/2020   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   Complex cyst of left ovary 02/01/2019   Sarcoidosis 02/01/2019   Leg pain 12/07/2018   Low libido 11/27/2018   Decreased pedal pulses 10/04/2018   Chronic low back pain with sciatica 09/15/2018   Impaired fasting blood sugar 09/11/2018   Mild sleep apnea 08/03/2018   Hypokalemia 06/28/2018   Medication side effect 06/22/2018   Vitamin D  deficiency 05/26/2018   Encounter for health maintenance examination with abnormal findings 05/26/2018   Depression, major, single episode, moderate (HCC) 05/26/2018   Daytime sleepiness 05/26/2018   GERD (gastroesophageal reflux disease) 05/26/2018   Essential hypertension, benign 11/15/2016   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/10/2016   Class 1 obesity with body mass index (BMI) of 32.0 to 32.9 in adult 10/23/2016   Common migraine with intractable migraine 10/07/2016   DOE (dyspnea on exertion) 04/15/2016   Seizures (HCC) 03/11/2016   Anemia 11/12/2014   Iron deficiency 11/12/2014    Past Surgical History:  Procedure Laterality Date   ABDOMINAL HYSTERECTOMY     ESOPHAGEAL MANOMETRY N/A 09/05/2019   Procedure: ESOPHAGEAL MANOMETRY (EM);  Surgeon: San Sandor GAILS, DO;  Location: [facility];  Service: Gastroenterology;  Laterality: N/A;   GASTRIC BYPASS  2021   *approximately   LAPAROSCOPIC OVARIAN CYSTECTOMY Left 03/11/2016   Procedure: LAPAROSCOPIC OVARIAN CYSTECTOMY;  Surgeon: Shanda SHAUNNA Muscat, MD;  Location: [facility];  Service: Gynecology;  Laterality: Left;   LAPAROSCOPIC VAGINAL HYSTERECTOMY WITH SALPINGECTOMY Bilateral 03/11/2016   Procedure: LAPAROSCOPIC ASSISTED VAGINAL HYSTERECTOMY WITH SALPINGECTOMY;  Surgeon: Shanda SHAUNNA Muscat, MD;  Location: [facility];  Service: Gynecology;  Laterality: Bilateral;   PH IMPEDANCE STUDY  09/05/2019   Procedure: PH IMPEDANCE STUDY;  Surgeon: San Sandor GAILS, DO;  Location: [facility];  Service: Gastroenterology;;   TUBAL LIGATION     UPPER GASTROINTESTINAL ENDOSCOPY   10/11/2019   06/2019    OB History     Gravida  1   Para      Term      Preterm      AB      Living         SAB      IAB      Ectopic      Multiple      Live Births               Home Medications    Prior to Admission medications   Medication Sig Start Date End Date Taking? Authorizing Provider  benzonatate  (TESSALON ) 100 MG capsule Take 1 capsule (100 mg total) by mouth 3 (three) times daily as needed for cough. Do not take with alcohol or while driving or operating heavy machinery.  May cause drowsiness. 11/02/24  Yes Chandra Raisin  A, NP  fluticasone  (FLOVENT  HFA) 110 MCG/ACT inhaler Inhale 1 puff into the lungs in the morning and at bedtime. Rinse mouth out after each use 11/02/24  Yes Chandra Raisin A, NP  predniSONE  (DELTASONE ) 20 MG tablet Take 2 tablets (40 mg total) by mouth daily with breakfast for 5 days. 11/02/24 11/07/24 Yes Chandra Raisin LABOR, NP  acetaminophen  (TYLENOL ) 500 MG tablet Take 500 mg by mouth in the morning and at bedtime.    [provider]  albuterol  (PROVENTIL ) (2.5 MG/3ML) 0.083% nebulizer solution Inhale 3 mLs (2.5 mg total) via nebulizer every 6 (six) hours as needed for wheezing or shortness of breath. 01/24/23   Melvenia Manus BRAVO, MD  albuterol  (VENTOLIN  HFA) 108 (90 Base) MCG/ACT inhaler Inhale 2 puffs into the lungs every 4 (four) hours as needed. 12/06/23   Stuart Vernell Norris, PA-C  ALPRAZolam  (XANAX ) 0.25 MG tablet Take 0.25 mg by mouth 2 (two) times daily as needed for anxiety.    [provider]  amphetamine -dextroamphetamine  (ADDERALL XR) 20 MG 24 hr capsule Take 20 mg by mouth daily.    [provider]  azelastine (ASTELIN) 0.1 % nasal spray Place 1 spray into both nostrils 2 (two) times daily. Use in each nostril as directed 10/14/24   Stuart Vernell Norris, PA-C  celecoxib (CELEBREX) 200 MG capsule Take 1 capsule (200 mg total) by mouth 2 (two) times daily. 10/11/24   Gottwalt, Redell A, DO   chlorthalidone  (HYGROTON ) 25 MG tablet Take 1 tablet (25 mg total) by mouth daily. Pt needs to schedule appt. Pt can get a 90 day supply after office visit - 3rd and final attempt 08/22/23   Parthenia Olivia HERO, PA-C  cyclobenzaprine  (FLEXERIL ) 10 MG tablet Take 1 tablet (10 mg total) by mouth 2 (two) times daily as needed for muscle spasms. 07/18/24   Leath-Warren, Etta PARAS, NP  fluconazole  (DIFLUCAN ) 150 MG tablet Take 1 tablet (150 mg total) by mouth daily. May repeat in 3 days if needed. 10/10/24   Wheeler Raisin CROME, NP  fluticasone  (FLONASE ) 50 MCG/ACT nasal spray Place 1 spray into both nostrils 2 (two) times daily. 03/05/23   Stuart Vernell Norris, PA-C  ondansetron  (ZOFRAN -ODT) 4 MG disintegrating tablet Take 4 mg by mouth every 8 (eight) hours as needed. 01/28/24   [provider]  promethazine -dextromethorphan  (PROMETHAZINE -DM) 6.25-15 MG/5ML syrup Take 5 mLs by mouth 4 (four) times daily as needed. 10/14/24   Stuart Vernell Norris, PA-C  QUEtiapine  (SEROQUEL ) 100 MG tablet Take 1 tablet (100 mg total) by mouth at bedtime. 10/25/24   Wheeler Raisin CROME, NP  rizatriptan  (MAXALT ) 5 MG tablet Take 5 mg by mouth as needed for migraine. May repeat in 2 hours if needed    [provider]  tiZANidine  (ZANAFLEX ) 4 MG tablet Take 1 tablet (4 mg total) by mouth every 8 (eight) hours as needed for muscle spasms. 06/25/24   Leath-Warren, Etta PARAS, NP  topiramate  (TOPAMAX ) 25 MG tablet Take 1 tablet (25 mg total) by mouth 2 (two) times daily. 09/24/24   Wheeler Raisin CROME, NP    Family History Family History  Adopted: Yes  Problem Relation Age of Onset   Asthma Mother    Heart Problems Mother    Migraines Mother    COPD Mother    Heart attack Mother    Thyroid  disease Mother    Diabetes Father    Peptic Ulcer Father    Heart Problems Father    COPD Father  Heart attack Father    Dementia Maternal Grandmother    Heart Problems Maternal Grandfather    Heart attack  Maternal Grandfather    Diabetes Paternal Grandmother    Diabetes Paternal Grandfather    Other Son        Growing pains   Colon cancer Neg Hx    Colon polyps Neg Hx    Esophageal cancer Neg Hx    Rectal cancer Neg Hx    Stomach cancer Neg Hx     Social History Social History   Tobacco Use   Smoking status: Former    Current packs/day: 0.00    Average packs/day: 0.3 packs/day for 17.0 years (4.3 ttl pk-yrs)    Types: Cigarettes    Start date: 03/02/1996    Quit date: 03/02/2013    Years since quitting: 11.6    Passive exposure: Past   Smokeless tobacco: Never  Vaping Use   Vaping status: Never Used  Substance Use Topics   Alcohol use: Yes    Comment: occasional   Drug use: Not Currently    Types: Marijuana    Comment: former - 6 yrs ago     Allergies   Aspirin and Nsaids   Review of Systems Review of Systems Per HPI  Physical Exam Triage Vital Signs ED Triage Vitals  Encounter Vitals Group     BP 11/02/24 1039 139/81     Girls Systolic BP Percentile --      Girls Diastolic BP Percentile --      Boys Systolic BP Percentile --      Boys Diastolic BP Percentile --      Pulse Rate 11/02/24 1039 (!) 55     Resp 11/02/24 1039 20     Temp 11/02/24 1039 98.7 F (37.1 C)     Temp Source 11/02/24 1039 Oral     SpO2 11/02/24 1039 98 %     Weight --      Height --      Head Circumference --      Peak Flow --      Pain Score 11/02/24 1042 4     Pain Loc --      Pain Education --      Exclude from Growth Chart --    No data found.  Updated Vital Signs BP 139/81 (BP Location: Right Arm)   Pulse (!) 55   Temp 98.7 F (37.1 C) (Oral)   Resp 20   LMP 02/19/2016   SpO2 98%   Visual Acuity Right Eye Distance:   Left Eye Distance:   Bilateral Distance:    Right Eye Near:   Left Eye Near:    Bilateral Near:     Physical Exam Vitals and nursing note reviewed.  Constitutional:      General: She is not in acute distress.    Appearance: Normal appearance.  She is not ill-appearing or toxic-appearing.  HENT:     Head: Normocephalic and atraumatic.     Right Ear: Tympanic membrane, ear canal and external ear normal.     Left Ear: Tympanic membrane, ear canal and external ear normal.     Nose: Nose normal. No congestion or rhinorrhea.     Mouth/Throat:     Mouth: Mucous membranes are moist.     Pharynx: Oropharynx is clear. No oropharyngeal exudate or posterior oropharyngeal erythema.  Eyes:     General: No scleral icterus.    Extraocular Movements: Extraocular movements intact.  Cardiovascular:  Rate and Rhythm: Normal rate and regular rhythm.  Pulmonary:     Effort: Pulmonary effort is normal. No respiratory distress.     Breath sounds: Normal breath sounds. Decreased air movement present. No wheezing, rhonchi or rales.  Musculoskeletal:     Cervical back: Normal range of motion and neck supple.  Lymphadenopathy:     Cervical: No cervical adenopathy.  Skin:    General: Skin is warm and dry.     Coloration: Skin is not jaundiced or pale.     Findings: No erythema or rash.  Neurological:     Mental Status: She is alert and oriented to person, place, and time.  Psychiatric:        Behavior: Behavior is cooperative.      UC Treatments / Results  Labs (all labs ordered are listed, but only abnormal results are displayed) Labs Reviewed - No data to display  EKG   Radiology No results found.  Procedures Procedures (including critical care time)  Medications Ordered in UC Medications - No data to display  Initial Impression / Assessment and Plan / UC Course  I have reviewed the triage vital signs and the nursing notes.  Pertinent labs & imaging results that were available during my care of the patient were reviewed by me and considered in my medical decision making (see chart for details).   Patient is well-appearing, normotensive, afebrile, not tachycardic, not tachypneic, oxygenating well on room air.   1. Moderate  persistent asthma with acute exacerbation 2. Medication refill 3. Viral URI with cough Vitals and exam are reassuring today-no wheezing and SpO2 is 100% on room air on recheck Suspect asthma exacerbation due to running out of Flovent  as well as possible viral upper respiratory infection Viral testing deferred at this time Supportive care discussed-start oral prednisone  and resume Flovent , also recommended Mucinex  and Tessalon  Perles as needed for cough Continue to use rescue inhaler every 4-6 hours as needed Strict ER precautions discussed Return precautions and follow-up with PCP recommended if symptoms do not improve with treatment  The patient was given the opportunity to ask questions.  All questions answered to their satisfaction.  The patient is in agreement to this plan.   Final Clinical Impressions(s) / UC Diagnoses   Final diagnoses:  Moderate persistent asthma with acute exacerbation  Medication refill  Viral URI with cough     Discharge Instructions      You are having an exacerbation of asthma likely due to being out of the Flovent  and a viral upper respiratory infection.  Resume Flovent  and continue using albuterol  rescue inhaler every 4-6 hours as needed for wheezing or shortness of breath.  In addition, start taking oral prednisone  to help with lung inflammation.  You can take the cough pill every 8 hours as needed for cough.  Also recommend starting guaifenesin  600 mg twice daily for as long as you do have the congestion.  Symptoms should improve over the next few days.  Seek care if symptoms worsen in the ER.  If symptoms do not improve after 1 week, follow-up here or with your primary care provider.      ED Prescriptions     Medication Sig Dispense Auth. Provider   fluticasone  (FLOVENT  HFA) 110 MCG/ACT inhaler Inhale 1 puff into the lungs in the morning and at bedtime. Rinse mouth out after each use 1 each Chandra Harlene LABOR, NP   predniSONE  (DELTASONE ) 20 MG  tablet Take 2 tablets (40 mg total) by  mouth daily with breakfast for 5 days. 10 tablet Chandra Raisin A, NP   benzonatate  (TESSALON ) 100 MG capsule Take 1 capsule (100 mg total) by mouth 3 (three) times daily as needed for cough. Do not take with alcohol or while driving or operating heavy machinery.  May cause drowsiness. 21 capsule Chandra Raisin LABOR, NP      PDMP not reviewed this encounter.   Chandra Raisin LABOR, NP 11/02/24 1108

## 2024-11-02 NOTE — ED Triage Notes (Signed)
 Pt reports cough x2 days. Pt reports is out of flovent  inhaler and needs refill. Last dose Monday night. Denies any known fevers. NAD noted.

## 2024-11-08 ENCOUNTER — Ambulatory Visit: Admitting: Student

## 2024-11-09 ENCOUNTER — Inpatient Hospital Stay: Admitting: Student

## 2024-11-13 ENCOUNTER — Other Ambulatory Visit (HOSPITAL_COMMUNITY): Payer: Self-pay | Admitting: Student

## 2024-11-13 DIAGNOSIS — Z1231 Encounter for screening mammogram for malignant neoplasm of breast: Secondary | ICD-10-CM

## 2024-11-14 ENCOUNTER — Ambulatory Visit (INDEPENDENT_AMBULATORY_CARE_PROVIDER_SITE_OTHER): Admitting: Student

## 2024-11-14 ENCOUNTER — Encounter: Payer: Self-pay | Admitting: Student

## 2024-11-14 ENCOUNTER — Inpatient Hospital Stay: Admitting: Student

## 2024-11-14 ENCOUNTER — Ambulatory Visit
Admission: RE | Admit: 2024-11-14 | Discharge: 2024-11-14 | Disposition: A | Discharge status: Home / Self Care | Attending: Internal Medicine | Admitting: Internal Medicine

## 2024-11-14 VITALS — BP 133/82 | HR 69 | Temp 98.2°F | Resp 20

## 2024-11-14 DIAGNOSIS — M546 Pain in thoracic spine: Secondary | ICD-10-CM

## 2024-11-14 DIAGNOSIS — Z91199 Patient's noncompliance with other medical treatment and regimen due to unspecified reason: Secondary | ICD-10-CM

## 2024-11-14 MED ORDER — CYCLOBENZAPRINE HCL 10 MG PO TABS: 10.0000 mg | ORAL_TABLET | Freq: Three times a day (TID) | ORAL | 0 refills | Status: AC

## 2024-11-14 NOTE — ED Provider Notes (Signed)
 [facility]    CSN: [id number] Arrival date & time: 11/14/24  1209      History   Chief Complaint Chief Complaint  Patient presents with   Back Pain    SciaticaHerniated dic, muscle spasms, etc - Entered by patient    HPI Victoria Moore is a 48 y.o. female who presents with R thoracic back pain x 2 days. This area of pain is different than her usual sciatica. Has been taking Tylenol  and Ibuprofen . Has hx of chronic back pain and muscle relaxer's help, but she ran out. She missed her appointment with her PCP this am due to lack of transportation and rescheduled it for 12/21. Her pain is provoked with palpation and movement. She was seen by sports med last month and per their notes, she is supposed to try PT, steroid injections, and if these don't help needs repeated MRI. Today states her L mid back pain radiates to R leg, but not today. The location of her pain today is R lower thoracic region and she denies any injury. She denies paresthesia, urinary or bowel incontinence.     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 Fibromyalgia, doubt sarcoid    Asthma    Blood transfusion without reported diagnosis    Chest wall pain 05/26/2018   Chronic kidney disease    Chronic low back pain with sciatica 09/15/2018   Common migraine with intractable migraine 10/07/2016   Cough variant asthma vs UACS  10/23/2016   - Spirometry 04/15/2016  Very truncated exp loop effort dep portion only  - Allergy  profile 10/22/2016 >  Eos 0.2/  IgE  52 RAST POS grass/trees/ragweed  10/22/2016  After extensive coaching HFA effectiveness =    75% try duelra 100 2bid > improved   Daytime sleepiness 05/26/2018   Decreased pedal pulses 10/04/2018   Depression    Depression, major, single episode, moderate (HCC)  05/26/2018   Diabetes (HCC)    Dyspnea 04/15/2016   04/15/2016  Walked RA x 3 laps @ 185 ft each stopped due to  End of study, nl pace, no sob or desat    - Spirometry 04/15/2016  Very truncated exp loop effort dep portion only  - 10/22/2016  Walked RA x 3 laps @ 185 ft each stopped due to  End of study, slow pace, min sob/ no desat - full pfts rec 10/22/2016 >>>       Edema    Essential hypertension, benign 11/15/2016   Fibroids 03/11/2016   Frequency of urination 09/11/2018   GERD (gastroesophageal reflux disease)    Herniated lumbar intervertebral disc    Hiatal hernia    Hilar adenopathy    Hypertension    Hypokalemia 06/28/2018   Impaired fasting blood sugar 09/11/2018   Iron deficiency    Leg pain 12/07/2018   Low libido 11/27/2018   Migraine    Mild sleep apnea 08/03/2018   HST 06/20/18  AHI  8.1 / snoring with 02 nadir 80% >  08/03/2018 rec sleep medicine consultation    Morbid (severe) obesity due to excess calories (HCC) 10/23/2016   Personal history of sarcoidosis 05/26/2018   Prolonged capillary refill time    Right leg swelling 08/18/2018  Sarcoidosis    personal history of   Seizures (HCC)    history of    Sleep apnea    Spondylosis    Vitamin D  deficiency     Patient Active Problem List   Diagnosis Date Noted   Postmenopausal estrogen deficiency 10/10/2024   Vaginal itching 10/10/2024   Preventative health care 08/14/2024   Annual visit for general adult medical examination without abnormal findings 08/10/2024   Chronic kidney disease    Obesity (BMI 30.0-34.9) 07/26/2024   Encounter to establish care 07/24/2024   Peripheral neuropathy 10/12/2022   MDD (major depressive disorder) 10/12/2022   History of epilepsy 09/22/2022   Asthmatic bronchitis 01/23/2021   Social anxiety disorder 10/03/2020   Attention deficit hyperactivity disorder (ADHD), 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   Mild episode of recurrent major depressive disorder 10/03/2020   S/P  gastric bypass 05/12/2020   Intractable abdominal pain 04/27/2020   Benign intracranial hypertension 02/14/2020   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   Complex cyst of left ovary 02/01/2019   Sarcoidosis 02/01/2019   Leg pain 12/07/2018   Low libido 11/27/2018   Decreased pedal pulses 10/04/2018   Chronic low back pain with sciatica 09/15/2018   Impaired fasting blood sugar 09/11/2018   Mild sleep apnea 08/03/2018   Hypokalemia 06/28/2018   Medication side effect 06/22/2018   Vitamin D  deficiency 05/26/2018   Encounter for health maintenance examination with abnormal findings 05/26/2018   Depression, major, single episode, moderate (HCC) 05/26/2018   Daytime sleepiness 05/26/2018   GERD (gastroesophageal reflux disease) 05/26/2018   Essential hypertension, benign 11/15/2016   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/10/2016   Class 1 obesity with body mass index (BMI) of 32.0 to 32.9 in adult 10/23/2016   Common migraine with intractable migraine 10/07/2016   DOE (dyspnea on exertion) 04/15/2016   Seizures (HCC) 03/11/2016   Anemia 11/12/2014   Iron deficiency 11/12/2014    Past Surgical History:  Procedure Laterality Date   ABDOMINAL HYSTERECTOMY     ESOPHAGEAL MANOMETRY N/A 09/05/2019   Procedure: ESOPHAGEAL MANOMETRY (EM);  Surgeon: San Sandor GAILS, DO;  Location: [facility];  Service: Gastroenterology;  Laterality: N/A;   GASTRIC BYPASS  2021   *approximately   LAPAROSCOPIC OVARIAN CYSTECTOMY Left 03/11/2016   Procedure: LAPAROSCOPIC OVARIAN CYSTECTOMY;  Surgeon: Shanda SHAUNNA Muscat, MD;  Location: [facility];  Service: Gynecology;  Laterality: Left;   LAPAROSCOPIC VAGINAL HYSTERECTOMY WITH SALPINGECTOMY Bilateral 03/11/2016   Procedure: LAPAROSCOPIC ASSISTED VAGINAL HYSTERECTOMY WITH SALPINGECTOMY;  Surgeon: Shanda SHAUNNA Muscat, MD;  Location: [facility];  Service: Gynecology;  Laterality: Bilateral;   PH IMPEDANCE STUDY  09/05/2019   Procedure: PH IMPEDANCE STUDY;  Surgeon: San Sandor GAILS, DO;  Location: [facility];  Service: Gastroenterology;;   TUBAL LIGATION     UPPER GASTROINTESTINAL ENDOSCOPY  10/11/2019   06/2019    OB History     Gravida  1   Para      Term      Preterm      AB      Living         SAB      IAB      Ectopic      Multiple      Live Births               Home Medications    Prior to Admission medications   Medication Sig Start Date End Date Taking? Authorizing Provider  acetaminophen  (TYLENOL ) 500 MG tablet Take 500  mg by mouth in the morning and at bedtime.   Yes [provider]  albuterol  (PROVENTIL ) (2.5 MG/3ML) 0.083% nebulizer solution Inhale 3 mLs (2.5 mg total) via nebulizer every 6 (six) hours as needed for wheezing or shortness of breath. 01/24/23  Yes Melvenia Manus BRAVO, MD  albuterol  (VENTOLIN  HFA) 108 (90 Base) MCG/ACT inhaler Inhale 2 puffs into the lungs every 4 (four) hours as needed. 12/06/23  Yes Stuart Vernell Norris, PA-C  ALPRAZolam  (XANAX ) 0.25 MG tablet Take 0.25 mg by mouth 2 (two) times daily as needed for anxiety.   Yes [provider]  azelastine (ASTELIN) 0.1 % nasal spray Place 1 spray into both nostrils 2 (two) times daily. Use in each nostril as directed 10/14/24  Yes Stuart Vernell Norris, PA-C  promethazine -dextromethorphan  (PROMETHAZINE -DM) 6.25-15 MG/5ML syrup Take 5 mLs by mouth 4 (four) times daily as needed. 10/14/24  Yes Stuart Vernell Norris, PA-C  QUEtiapine  (SEROQUEL ) 100 MG tablet Take 1 tablet (100 mg total) by mouth at bedtime. 10/25/24  Yes Wheeler Harlene CROME, NP  rizatriptan  (MAXALT ) 5 MG tablet Take 5 mg by mouth as needed for migraine. May repeat in 2 hours if needed   Yes [provider]  topiramate  (TOPAMAX ) 25 MG tablet Take 1 tablet (25 mg total) by mouth 2 (two) times daily. 09/24/24  Yes Yacopino, Jessica L, NP  amphetamine -dextroamphetamine  (ADDERALL XR) 20 MG 24 hr capsule Take 20 mg by mouth daily.    [provider]  celecoxib  (CELEBREX) 200 MG capsule Take 1 capsule (200 mg total) by mouth 2 (two) times daily. 10/11/24   Gottwalt, Redell A, DO  chlorthalidone  (HYGROTON ) 25 MG tablet Take 1 tablet (25 mg total) by mouth daily. Pt needs to schedule appt. Pt can get a 90 day supply after office visit - 3rd and final attempt Patient not taking: Reported on 11/14/2024 08/22/23   Parthenia Olivia HERO, PA-C  cyclobenzaprine  (FLEXERIL ) 10 MG tablet Take 1 tablet (10 mg total) by mouth 3 (three) times daily. 11/14/24   Rodriguez-Southworth, Mahina Salatino, PA-C  fluconazole  (DIFLUCAN ) 150 MG tablet Take 1 tablet (150 mg total) by mouth daily. May repeat in 3 days if needed. 10/10/24   Wheeler Harlene CROME, NP  fluticasone  (FLONASE ) 50 MCG/ACT nasal spray Place 1 spray into both nostrils 2 (two) times daily. 03/05/23   Stuart Vernell Norris, PA-C  fluticasone  (FLOVENT  HFA) 110 MCG/ACT inhaler Inhale 1 puff into the lungs in the morning and at bedtime. Rinse mouth out after each use 11/02/24   Chandra Harlene LABOR, NP  ondansetron  (ZOFRAN -ODT) 4 MG disintegrating tablet Take 4 mg by mouth every 8 (eight) hours as needed. 01/28/24   [provider]  tiZANidine  (ZANAFLEX ) 4 MG tablet Take 1 tablet (4 mg total) by mouth every 8 (eight) hours as needed for muscle spasms. 06/25/24   Leath-Warren, Etta PARAS, NP    Family History Family History  Adopted: Yes  Problem Relation Age of Onset   Asthma Mother    Heart Problems Mother    Migraines Mother    COPD Mother    Heart attack Mother    Thyroid  disease Mother    Diabetes Father    Peptic Ulcer Father    Heart Problems Father    COPD Father    Heart attack Father    Dementia Maternal Grandmother    Heart Problems Maternal Grandfather    Heart attack Maternal Grandfather    Diabetes Paternal Grandmother    Diabetes Paternal Grandfather  Other Son        Growing pains   Colon cancer Neg Hx    Colon polyps Neg Hx    Esophageal cancer Neg Hx    Rectal cancer Neg Hx    Stomach  cancer Neg Hx     Social History Social History   Tobacco Use   Smoking status: Former    Current packs/day: 0.00    Average packs/day: 0.3 packs/day for 17.0 years (4.3 ttl pk-yrs)    Types: Cigarettes    Start date: 03/02/1996    Quit date: 03/02/2013    Years since quitting: 11.7    Passive exposure: Past   Smokeless tobacco: Never  Vaping Use   Vaping status: Never Used  Substance Use Topics   Alcohol use: Yes    Comment: occasional   Drug use: Not Currently    Types: Marijuana    Comment: former - 6 yrs ago     Allergies   Aspirin and Nsaids   Review of Systems Review of Systems As noted in HPI  Physical Exam Triage Vital Signs ED Triage Vitals  Encounter Vitals Group     BP 11/14/24 1224 133/82     Girls Systolic BP Percentile --      Girls Diastolic BP Percentile --      Boys Systolic BP Percentile --      Boys Diastolic BP Percentile --      Pulse Rate 11/14/24 1224 69     Resp 11/14/24 1224 20     Temp 11/14/24 1224 98.2 F (36.8 C)     Temp Source 11/14/24 1224 Oral     SpO2 11/14/24 1224 96 %     Weight --      Height --      Head Circumference --      Peak Flow --      Pain Score 11/14/24 1218 10     Pain Loc --      Pain Education --      Exclude from Growth Chart --    No data found.  Updated Vital Signs BP 133/82 (BP Location: Right Arm)   Pulse 69   Temp 98.2 F (36.8 C) (Oral)   Resp 20   LMP 02/19/2016   SpO2 96%   Visual Acuity Right Eye Distance:   Left Eye Distance:   Bilateral Distance:    Right Eye Near:   Left Eye Near:    Bilateral Near:     Physical Exam Vitals and nursing note reviewed.  Constitutional:      General: She is not in acute distress.    Appearance: She is obese. She is not ill-appearing or toxic-appearing.  HENT:     Right Ear: External ear normal.     Left Ear: External ear normal.  Eyes:     General: No scleral icterus.    Conjunctiva/sclera: Conjunctivae normal.  Pulmonary:     Effort:  Pulmonary effort is normal.  Musculoskeletal:        General: Normal range of motion.     Cervical back: Neck supple.     Comments: THORACIC SPINE- has local tenderness around T9-10 areas on the R with palpation and worse with L thoracic rotation, and lateral bends. Skin shows no rashes.   LUMBAR SPINE- mild tenderness on R SI region, negative SLR. Normal tip toe and heel walk.   Skin:    General: Skin is warm and dry.  Neurological:  Mental Status: She is alert and oriented to person, place, and time.     Gait: Gait normal.     Deep Tendon Reflexes: Reflexes normal.  Psychiatric:        Mood and Affect: Mood normal.        Behavior: Behavior normal.        Thought Content: Thought content normal.        Judgment: Judgment normal.     UC Treatments / Results  Labs (all labs ordered are listed, but only abnormal results are displayed) Labs Reviewed - No data to display  EKG   Radiology No results found.  Procedures Procedures (including critical care time)  Medications Ordered in UC Medications - No data to display  Initial Impression / Assessment and Plan / UC Course  I have reviewed the triage vital signs and the nursing notes. Has R lower thoracic back pain which today she does not have her usual lumbar radicular pain, and exam does not show any neuro deficit.  I refilled her Flexeril  since she is able to tolerate this fine. She was advised to apply heat on this area and I taught her stretches to do at home for  tid after heat treatment.  Needs to start PT as soon as she gets a call from PT and keep apt with PCP to continue plan of care from sport med doctor.   Pertinent  imaging results that were available during my care of the patient were reviewed by me and considered in my medical decision making (see chart for details). Narrative & Impression  CLINICAL DATA:  Lower back pain radiating into lower extremities. Feet/toe numbness.   EXAM: MRI LUMBAR SPINE  WITHOUT CONTRAST   TECHNIQUE: Multiplanar, multisequence MR imaging of the lumbar spine was performed. No intravenous contrast was administered.   COMPARISON:  04/19/2019 lumbar spine radiographs. 11/07/2016 MRI lumbar spine.   FINDINGS: Segmentation:  Standard.   Alignment:  Normal.   Vertebrae: Normal bone marrow signal intensity. No fracture or aggressive osseous lesion.   Conus medullaris and cauda equina: Conus extends to the L2 level. Conus and cauda equina appear normal.   Disc levels: Desiccation and mild disc space loss at the L5-S1 level.   L1-2: No significant disc bulge, spinal canal or neural foraminal narrowing.   L2-3: No significant disc bulge, spinal canal or neural foraminal narrowing.   L3-4: No significant disc bulge, spinal canal or neural foraminal narrowing.   L4-5: Minimal disc bulge and bilateral facet hypertrophy, unchanged. Patent spinal canal neural foramen.   L5-S1: Mild disc bulge grazing the exiting L5 nerve roots and bilateral facet hypertrophy. Patent spinal canal. Mild bilateral neural foraminal narrowing, unchanged.   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   IMPRESSION: Mild bilateral L5-S1 neural foraminal narrowing, unchanged.   No significant spinal canal narrowing.     Electronically Signed   By: Christene Morones M.D.   On: 11/03/2020 09:08      Final Clinical Impressions(s) / UC Diagnoses   Final diagnoses:  Acute left-sided thoracic back pain   Discharge Instructions   None    ED Prescriptions     Medication Sig Dispense Auth. Provider   cyclobenzaprine  (FLEXERIL ) 10 MG tablet Take 1 tablet (10 mg total) by mouth 3 (three) times daily. 15 tablet Rodriguez-Southworth, Kyra, PA-C      PDMP not reviewed this encounter.   Lindi Kyra, PA-C 11/14/24 1300

## 2024-11-14 NOTE — Progress Notes (Signed)
 Pt no show- Letter sent out

## 2024-11-14 NOTE — ED Triage Notes (Signed)
 Pt c/o lower back pain radiating to right leg x 2 days.Taking tylenol  and ibuprofen . States she usually takes muscle relaxer but ran out

## 2024-11-15 ENCOUNTER — Ambulatory Visit (HOSPITAL_COMMUNITY)
Admission: RE | Admit: 2024-11-15 | Discharge: 2024-11-15 | Disposition: A | Discharge status: Home / Self Care | Source: Ambulatory Visit | Attending: Student | Admitting: Student

## 2024-11-15 ENCOUNTER — Encounter (HOSPITAL_COMMUNITY): Payer: Self-pay

## 2024-11-15 DIAGNOSIS — Z1231 Encounter for screening mammogram for malignant neoplasm of breast: Secondary | ICD-10-CM | POA: Insufficient documentation

## 2024-11-16 ENCOUNTER — Ambulatory Visit (INDEPENDENT_AMBULATORY_CARE_PROVIDER_SITE_OTHER): Admitting: Student

## 2024-11-16 ENCOUNTER — Encounter: Payer: Self-pay | Admitting: Student

## 2024-11-16 DIAGNOSIS — Z91199 Patient's noncompliance with other medical treatment and regimen due to unspecified reason: Secondary | ICD-10-CM

## 2024-11-16 NOTE — Progress Notes (Unsigned)
 NO CALL, NO SHOW x 3

## 2024-11-18 ENCOUNTER — Other Ambulatory Visit: Payer: Self-pay

## 2024-11-18 DIAGNOSIS — M5442 Lumbago with sciatica, left side: Secondary | ICD-10-CM

## 2024-11-20 ENCOUNTER — Other Ambulatory Visit (HOSPITAL_COMMUNITY): Payer: Self-pay | Admitting: Student

## 2024-11-20 DIAGNOSIS — R928 Other abnormal and inconclusive findings on diagnostic imaging of breast: Secondary | ICD-10-CM

## 2024-11-21 ENCOUNTER — Ambulatory Visit

## 2024-11-27 ENCOUNTER — Ambulatory Visit: Admitting: Podiatry

## 2024-11-27 ENCOUNTER — Ambulatory Visit (INDEPENDENT_AMBULATORY_CARE_PROVIDER_SITE_OTHER)

## 2024-11-27 ENCOUNTER — Encounter

## 2024-11-27 ENCOUNTER — Encounter: Payer: Self-pay | Admitting: Podiatry

## 2024-11-27 DIAGNOSIS — M7751 Other enthesopathy of right foot: Secondary | ICD-10-CM

## 2024-11-27 DIAGNOSIS — M21612 Bunion of left foot: Secondary | ICD-10-CM

## 2024-11-27 DIAGNOSIS — M7752 Other enthesopathy of left foot: Secondary | ICD-10-CM | POA: Diagnosis not present

## 2024-11-27 DIAGNOSIS — M778 Other enthesopathies, not elsewhere classified: Secondary | ICD-10-CM

## 2024-11-27 DIAGNOSIS — M21611 Bunion of right foot: Secondary | ICD-10-CM

## 2024-11-27 DIAGNOSIS — M19072 Primary osteoarthritis, left ankle and foot: Secondary | ICD-10-CM

## 2024-11-27 DIAGNOSIS — M19071 Primary osteoarthritis, right ankle and foot: Secondary | ICD-10-CM

## 2024-11-27 MED ORDER — PREDNISONE 20 MG PO TABS: ORAL_TABLET | ORAL | 0 refills | Status: DC

## 2024-11-27 NOTE — Progress Notes (Unsigned)
 Chief Complaint  Patient presents with   Foot Pain    Dorsal foot pain.Tingling, sharp pain, numbness, pain shoots from hips to toes both feet, swelling, etc x 3 months. Tried heat, muscle rub, soaks, Tylenol . Non diabetic.    Discussed the use of AI scribe software for clinical note transcription with the patient, who gave verbal consent to proceed.  History of Present Illness Victoria Moore is a 48 year old female with osteoarthritis who presents with bilateral foot and leg pain.  She has been experiencing bilateral foot and leg pain for the past three months, with a worsening trajectory. The pain is described as tingling, sharp, and sometimes feels like a pen is sticking into her feet. It is accompanied by swelling, primarily on the top of the left foot, and is worse on the left side.  She has been using soaking and muscle rubs for temporary relief, but these have not been effective. She is currently taking ibuprofen  and muscle relaxers, but these have not provided significant relief. The pain occurs intermittently throughout the day and can interfere with her sleep. She also experiences cramping in her toes.  No recent changes in medication or travel history. She mentions a previous diagnosis of osteoarthritis, although she is unsure if it was specifically in her feet. Her feet stay cold, but they are not colder than her hands. No funny sensations when tapping on her legs.    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 Fibromyalgia, doubt sarcoid    Asthma    Blood transfusion without reported diagnosis    Chest wall pain 05/26/2018   Chronic kidney disease    Chronic low back pain with sciatica 09/15/2018   Common migraine with intractable migraine 10/07/2016   Cough variant asthma  vs UACS  10/23/2016   - Spirometry 04/15/2016  Very truncated exp loop effort dep portion only  - Allergy  profile 10/22/2016 >  Eos 0.2/  IgE  52 RAST POS grass/trees/ragweed  10/22/2016  After extensive coaching HFA effectiveness =    75% try duelra 100 2bid > improved   Daytime sleepiness 05/26/2018   Decreased pedal pulses 10/04/2018   Depression    Depression, major, single episode, moderate (HCC) 05/26/2018   Diabetes (HCC)    Dyspnea 04/15/2016   04/15/2016  Walked RA x 3 laps @ 185 ft each stopped due to  End of study, nl pace, no sob or desat    - Spirometry 04/15/2016  Very truncated exp loop effort dep portion only  - 10/22/2016  Walked RA x 3 laps @ 185 ft each stopped due to  End of study, slow pace, min sob/ no desat - full pfts rec 10/22/2016 >>>       Edema    Essential hypertension, benign 11/15/2016   Fibroids 03/11/2016   Frequency of urination 09/11/2018   GERD (gastroesophageal reflux disease)    Herniated lumbar intervertebral disc    Hiatal hernia    Hilar adenopathy    Hypertension    Hypokalemia 06/28/2018   Impaired fasting blood sugar 09/11/2018   Iron deficiency    Leg pain 12/07/2018   Low libido 11/27/2018   Migraine    Mild sleep apnea 08/03/2018  HST 06/20/18  AHI  8.1 / snoring with 02 nadir 80% >  08/03/2018 rec sleep medicine consultation    Morbid (severe) obesity due to excess calories (HCC) 10/23/2016   Personal history of sarcoidosis 05/26/2018   Prolonged capillary refill time    Right leg swelling 08/18/2018   Sarcoidosis    personal history of   Seizures (HCC)    history of    Sleep apnea    Spondylosis    Vitamin D  deficiency    Past Surgical History:  Procedure Laterality Date   ABDOMINAL HYSTERECTOMY     ESOPHAGEAL MANOMETRY N/A 09/05/2019   Procedure: ESOPHAGEAL MANOMETRY (EM);  Surgeon: San Sandor GAILS, DO;  Location: [facility];  Service: Gastroenterology;  Laterality: N/A;   GASTRIC BYPASS  2021   *approximately    LAPAROSCOPIC OVARIAN CYSTECTOMY Left 03/11/2016   Procedure: LAPAROSCOPIC OVARIAN CYSTECTOMY;  Surgeon: Shanda SHAUNNA Muscat, MD;  Location: [facility];  Service: Gynecology;  Laterality: Left;   LAPAROSCOPIC VAGINAL HYSTERECTOMY WITH SALPINGECTOMY Bilateral 03/11/2016   Procedure: LAPAROSCOPIC ASSISTED VAGINAL HYSTERECTOMY WITH SALPINGECTOMY;  Surgeon: Shanda SHAUNNA Muscat, MD;  Location: [facility];  Service: Gynecology;  Laterality: Bilateral;   PH IMPEDANCE STUDY  09/05/2019   Procedure: PH IMPEDANCE STUDY;  Surgeon: San Sandor GAILS, DO;  Location: [facility];  Service: Gastroenterology;;   TUBAL LIGATION     UPPER GASTROINTESTINAL ENDOSCOPY  10/11/2019   06/2019   Allergies  Allergen Reactions   Aspirin Anaphylaxis and Hives        Nsaids Itching and Swelling   Physical Exam EXTREMITIES: Peripheral pulses intact. Extremities warm to touch. MUSCULOSKELETAL: Foot strength 5/5. Foot dorsiflexion strength 5/5. Foot eversion strength 5/5. Foot inversion strength 5/5. No abnormal sensation on tapping. Pain on palpation of foot arch and dorsal foot. Bunion tenderness. Foot structure normal. Bunion with small bone spur.   Results RADIOLOGY Foot X-ray: Bunion with a small bone spur; foot structure shows slight collapse; incidental finding of a longer metatarsal bone predisposing to bunion formation. No fracture seen   Assessment/Plan of Care: 1. Capsulitis of foot   2. Bunion, right foot   3. Bunion, left   4. Arthritis of both feet     Assessment & Plan Bilateral foot capsulitis Chronic bilateral foot pain for three months with tingling, sharp pain, and swelling, primarily on the top of the left foot. Symptoms are intermittent and worsen at night, affecting sleep. Examination reveals tenderness on the top of the foot and restricted toe movement. X-rays show a small bone spur but no severe abnormalities. The condition is likely due to inflammation and structural foot issues. - Prescribed a tapered  prednisone  pack to reduce inflammation. - Provided over-the-counter Powerstep arch supports to stabilize the foot and prevent further strain.  Bilateral bunions with bone spur Presence of bilateral bunions with a small bone spur, contributing to foot pain and restricted toe movement. The bone spur is incidental and not the primary cause of symptoms. Structural foot issues may exacerbate the condition. - Provided over-the-counter arch supports to alleviate pressure on the bunions.  Bilateral foot osteoarthritis Diagnosed with osteoarthritis, contributing to foot pain and structural changes. X-rays show mild abnormalities consistent with osteoarthritis. The condition is chronic and may be hereditary. - Prescribed a tapered prednisone  pack to manage inflammation associated with osteoarthritis. - Provided over-the-counter arch supports to support foot structure and reduce strain.  F/u 3 weeks if still symptomatic   Tram Wrenn D. Chara Marquard, DPM, FACFAS [facility]  [address] [place], [place] [address]                Office [phone]  Fax [phone]

## 2024-11-28 ENCOUNTER — Ambulatory Visit

## 2024-11-28 ENCOUNTER — Ambulatory Visit (HOSPITAL_BASED_OUTPATIENT_CLINIC_OR_DEPARTMENT_OTHER): Admission: RE | Admit: 2024-11-28 | Discharge: 2024-11-28 | Disposition: A | Source: Ambulatory Visit

## 2024-11-28 VITALS — BP 110/80 | Ht 70.0 in | Wt 232.0 lb

## 2024-11-28 DIAGNOSIS — G8929 Other chronic pain: Secondary | ICD-10-CM | POA: Insufficient documentation

## 2024-11-28 DIAGNOSIS — M5442 Lumbago with sciatica, left side: Secondary | ICD-10-CM

## 2024-11-28 DIAGNOSIS — M5441 Lumbago with sciatica, right side: Secondary | ICD-10-CM

## 2024-11-28 MED ORDER — MELOXICAM 15 MG PO TABS: 15.0000 mg | ORAL_TABLET | Freq: Every day | ORAL | 1 refills | Status: AC

## 2024-11-28 MED ORDER — PREDNISONE 20 MG PO TABS: 40.0000 mg | ORAL_TABLET | Freq: Every day | ORAL | 0 refills | Status: AC

## 2024-11-28 NOTE — Progress Notes (Signed)
   Subjective:    Patient ID: Victoria Moore, female    DOB: 48 y.o., 01/31/1976   MRN: [id number]  Chief Complaint: Chronic bilateral.  Discussed the use of AI scribe software for clinical note transcription with the patient, who gave verbal consent to proceed.  History of Present Illness [name] is a 48 year old female following up with me for chronic bilateral low back pain with bilateral sciatica most likely due to lumbar disc bulges leading to L5-S1 neuroforaminal stenosis previously visualized on 2021 MRI.  Unable to get in with PT since last visit with myself. Treated with Celebrex  with minimal relief.  Lumbar and lower extremity pain - Persistent back pain radiating down both legs - Increased intensity of pain in the upper back - No loss of bowel or bladder control - No spontaneous leg weakness - No numbness in the groin or bottom - MRI in 2021 demonstrated bulging discs  Response to treatment - Home exercise regimen for the past two weeks without improvement - Celebrex  provides approximately one hour of pain relief - No side effects from Celebrex  - No allergy  to anti-inflammatory medications  Physical therapy access - Physical therapy not yet initiated due to a family death and delayed start date - Currently on a waitlist for an earlier physical therapy appointment  Impact on activities of daily living - Difficulty with long drives for medical appointments due to pain - Resides in [place]     Objective:   There were no vitals filed for this visit.  Const: appears well, non-toxic, well groomed Psych: affect bright, interactive, smiling EENT: EOMI intact, conjunctiva appear normal Neck: no obvious masses, appears symmetric Resp: non-labored, appears symmetric Neuro: muscle bulk appears normal Skin: no obvious rashes noted  Wilmary's low back exam was deferred today.    Assessment & Plan:   Assessment & Plan Chronic low back pain with bilateral sciatica    Persistent pain radiates down both legs, with increased upper back pain. Celebrex  provides minimal relief for about an hour. Physical therapy referral was delayed due to scheduling issues, and home exercises have not significantly improved her condition. A previous MRI in 2021 showed bulging discs, likely causing nerve irritation and pain. Insurance requires six weeks of physical therapy before considering a repeat MRI. A short course of steroids is considered for acute exacerbations, especially during busy periods like Christmas. X-rays are ordered to assess the current status and satisfy insurance requirements. She is referred to physical therapy in [place] for immediate initiation. A different anti-inflammatory medication is prescribed for longer-lasting relief. A short course of steroids is provided for use during periods of increased activity or pain. A follow-up appointment is scheduled in six weeks.

## 2024-11-29 ENCOUNTER — Encounter: Admitting: Family Medicine

## 2024-12-03 ENCOUNTER — Ambulatory Visit: Payer: Self-pay

## 2024-12-04 ENCOUNTER — Ambulatory Visit (HOSPITAL_COMMUNITY)
Admission: RE | Admit: 2024-12-04 | Discharge: 2024-12-04 | Disposition: A | Source: Ambulatory Visit | Attending: Student

## 2024-12-04 ENCOUNTER — Other Ambulatory Visit (HOSPITAL_COMMUNITY): Payer: Self-pay | Admitting: Student

## 2024-12-04 ENCOUNTER — Inpatient Hospital Stay (HOSPITAL_COMMUNITY): Admission: RE | Admit: 2024-12-04 | Discharge: 2024-12-04 | Attending: Student

## 2024-12-04 ENCOUNTER — Encounter (HOSPITAL_COMMUNITY): Payer: Self-pay

## 2024-12-04 DIAGNOSIS — R928 Other abnormal and inconclusive findings on diagnostic imaging of breast: Secondary | ICD-10-CM

## 2024-12-05 ENCOUNTER — Other Ambulatory Visit (HOSPITAL_COMMUNITY): Payer: Self-pay | Admitting: Student

## 2024-12-05 DIAGNOSIS — R921 Mammographic calcification found on diagnostic imaging of breast: Secondary | ICD-10-CM

## 2024-12-11 ENCOUNTER — Inpatient Hospital Stay: Admission: RE | Admit: 2024-12-11 | Discharge: 2024-12-11 | Attending: Student

## 2024-12-11 DIAGNOSIS — N6001 Solitary cyst of right breast: Secondary | ICD-10-CM | POA: Insufficient documentation

## 2024-12-11 DIAGNOSIS — R921 Mammographic calcification found on diagnostic imaging of breast: Secondary | ICD-10-CM | POA: Diagnosis present

## 2024-12-11 HISTORY — PX: BREAST BIOPSY: SHX20

## 2024-12-11 MED ORDER — LIDOCAINE-EPINEPHRINE 1 %-1:100000 IJ SOLN
20.0000 mL | Freq: Once | INTRAMUSCULAR | Status: AC
  Administered 2024-12-11: 08:00:00 20 mL

## 2024-12-11 MED ORDER — LIDOCAINE 1 % OPTIME INJ - NO CHARGE
5.0000 mL | Freq: Once | INTRAMUSCULAR | Status: AC
  Administered 2024-12-11: 08:00:00 5 mL
  Filled 2024-12-11: qty 6

## 2024-12-12 ENCOUNTER — Ambulatory Visit (INDEPENDENT_AMBULATORY_CARE_PROVIDER_SITE_OTHER): Admitting: Orthopaedic Surgery

## 2024-12-12 DIAGNOSIS — M5442 Lumbago with sciatica, left side: Secondary | ICD-10-CM

## 2024-12-12 DIAGNOSIS — M5441 Lumbago with sciatica, right side: Secondary | ICD-10-CM

## 2024-12-12 NOTE — Progress Notes (Signed)
 Chief Complaint: Lower back pain     History of Present Illness:    Victoria Moore is a 48 y.o. female presents to get a work on going radiating lower back pain.  This has been ongoing now for several years.  She does have pain that shoots down both legs.  She has trialed anti-inflammatories in the past.  She has not had previous injections.    PMH/PSH/Family History/Social History/Meds/Allergies:    Past Medical History:  Diagnosis Date   Acute low back pain without sciatica 06/22/2018   ADHD    Allergy     Anemia    Anxiety    Arthritis    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/10/2016   Refer to Rheumatology see Nov 25 2016 [place] :  ? Fibromyalgia, doubt sarcoid    Asthma    Blood transfusion without reported diagnosis    Chest wall pain 05/26/2018   Chronic kidney disease    Chronic low back pain with sciatica 09/15/2018   Common migraine with intractable migraine 10/07/2016   Cough variant asthma vs UACS  10/23/2016   - Spirometry 04/15/2016  Very truncated exp loop effort dep portion only  - Allergy  profile 10/22/2016 >  Eos 0.2/  IgE  52 RAST POS grass/trees/ragweed  10/22/2016  After extensive coaching HFA effectiveness =    75% try duelra 100 2bid > improved   Daytime sleepiness 05/26/2018   Decreased pedal pulses 10/04/2018   Depression    Depression, major, single episode, moderate (HCC) 05/26/2018   Diabetes (HCC)    Dyspnea 04/15/2016   04/15/2016  Walked RA x 3 laps @ 185 ft each stopped due to  End of study, nl pace, no sob or desat    - Spirometry 04/15/2016  Very truncated exp loop effort dep portion only  - 10/22/2016  Walked RA x 3 laps @ 185 ft each stopped due to  End of study, slow pace, min sob/ no desat - full pfts rec 10/22/2016 >>>       Edema    Essential hypertension, benign 11/15/2016   Fibroids 03/11/2016   Frequency of urination 09/11/2018   GERD (gastroesophageal reflux disease)    Herniated lumbar intervertebral disc    Hiatal hernia    Hilar  adenopathy    Hypertension    Hypokalemia 06/28/2018   Impaired fasting blood sugar 09/11/2018   Iron deficiency    Leg pain 12/07/2018   Low libido 11/27/2018   Migraine    Mild sleep apnea 08/03/2018   HST 06/20/18  AHI  8.1 / snoring with 02 nadir 80% >  08/03/2018 rec sleep medicine consultation    Morbid (severe) obesity due to excess calories (HCC) 10/23/2016   Personal history of sarcoidosis 05/26/2018   Prolonged capillary refill time    Right leg swelling 08/18/2018   Sarcoidosis    personal history of   Seizures (HCC)    history of    Sleep apnea    Spondylosis    Vitamin D  deficiency    Past Surgical History:  Procedure Laterality Date   ABDOMINAL HYSTERECTOMY     BREAST BIOPSY Right 12/11/2024   MM RT BREAST BX W LOC DEV 1ST LESION IMAGE BX SPEC STEREO GUIDE 12/11/2024 [facility]   ESOPHAGEAL MANOMETRY N/A 09/05/2019   Procedure: ESOPHAGEAL MANOMETRY (EM);  Surgeon: San Sandor GAILS, DO;  Location: [facility];  Service: Gastroenterology;  Laterality: N/A;   GASTRIC BYPASS  2021   *approximately   LAPAROSCOPIC OVARIAN CYSTECTOMY  Left 03/11/2016   Procedure: LAPAROSCOPIC OVARIAN CYSTECTOMY;  Surgeon: Shanda SHAUNNA Muscat, MD;  Location: [facility];  Service: Gynecology;  Laterality: Left;   LAPAROSCOPIC VAGINAL HYSTERECTOMY WITH SALPINGECTOMY Bilateral 03/11/2016   Procedure: LAPAROSCOPIC ASSISTED VAGINAL HYSTERECTOMY WITH SALPINGECTOMY;  Surgeon: Shanda SHAUNNA Muscat, MD;  Location: [facility];  Service: Gynecology;  Laterality: Bilateral;   PH IMPEDANCE STUDY  09/05/2019   Procedure: PH IMPEDANCE STUDY;  Surgeon: San Sandor GAILS, DO;  Location: [facility];  Service: Gastroenterology;;   TUBAL LIGATION     UPPER GASTROINTESTINAL ENDOSCOPY  10/11/2019   06/2019   Social History   Socioeconomic History   Marital status: Married    Spouse name: Not on file   Number of children: 1   Years of education: GED   Highest education level: Some college, no degree   Occupational History   Occupation: Lawyer  Tobacco Use   Smoking status: Former    Current packs/day: 0.00    Average packs/day: 0.3 packs/day for 17.0 years (4.3 ttl pk-yrs)    Types: Cigarettes    Start date: 03/02/1996    Quit date: 03/02/2013    Years since quitting: 11.7    Passive exposure: Past   Smokeless tobacco: Never  Vaping Use   Vaping status: Never Used  Substance and Sexual Activity   Alcohol use: Yes    Comment: occasional   Drug use: Not Currently    Types: Marijuana    Comment: former - 6 yrs ago   Sexual activity: Not Currently    Birth control/protection: Surgical  Other Topics Concern   Not on file  Social History Narrative   She lives w/ her fiance'. She has one son.    Highest level of education:  12th grade, did not graduate. Currently back in school.   Right-handed   Caffeine : 1 cup of coffee some mornings      Social History      Diet? No       Do you drink/eat things with caffeine ? yes      Marital status?           Single                          What year were you married?       Do you live in a house, apartment, assisted living, condo, trailer, etc.? house      Is it one or more stories?     How many persons live in your home?  2      Do you have any pets in your home? (please list)   No       Highest level of education completed? Some college       Current or past profession:       Do you exercise?       Yes                                Type & how often? Daily       Advanced Directives      Do you have a living will? No       Do you have a DNR form?                                  If not,  do you want to discuss one? No       Do you have signed POA/HPOA for forms?  No       Functional Status      Do you have difficulty bathing or dressing yourself? No       Do you have difficulty preparing food or eating? No       Do you have difficulty managing your medications? No       Do you have difficulty managing your  finances? No       Do you have difficulty affording your medications? No       Social Drivers of Health   Tobacco Use: Medium Risk (11/28/2024)   Patient History    Smoking Tobacco Use: Former    Smokeless Tobacco Use: Never    Passive Exposure: Past  Physicist, Medical Strain: Low Risk (10/10/2024)   Overall Financial Resource Strain (CARDIA)    Difficulty of Paying Living Expenses: Not very hard  Recent Concern: Financial Resource Strain - Medium Risk (09/27/2024)   Overall Financial Resource Strain (CARDIA)    Difficulty of Paying Living Expenses: Somewhat hard  Food Insecurity: No Food Insecurity (10/10/2024)   Epic    Worried About Programme Researcher, Broadcasting/film/video in the Last Year: Never true    Ran Out of Food in the Last Year: Never true  Recent Concern: Food Insecurity - Food Insecurity Present (09/27/2024)   Epic    Worried About Programme Researcher, Broadcasting/film/video in the Last Year: Sometimes true    [employer] of Food in the Last Year: Sometimes true  Transportation Needs: No Transportation Needs (10/10/2024)   Epic    Lack of Transportation (Medical): No    Lack of Transportation (Non-Medical): No  Physical Activity: Insufficiently Active (10/10/2024)   Exercise Vital Sign    Days of Exercise per Week: 1 day    Minutes of Exercise per Session: 60 min  Stress: Stress Concern Present (10/10/2024)   [employer] of Occupational Health - Occupational Stress Questionnaire    Feeling of Stress: Very much  Social Connections: Moderately Isolated (10/10/2024)   Social Connection and Isolation Panel    Frequency of Communication with Friends and Family: More than three times a week    Frequency of Social Gatherings with Friends and Family: Once a week    Attends Religious Services: Never    Database Administrator or Organizations: No    Attends Engineer, Structural: Not on file    Marital Status: Living with partner  Depression (PHQ2-9): High Risk (07/26/2024)   Depression (PHQ2-9)     PHQ-2 Score: 17  Alcohol Screen: Low Risk (10/10/2024)   Alcohol Screen    Last Alcohol Screening Score (AUDIT): 1  Housing: Low Risk (10/10/2024)   Epic    Unable to Pay for Housing in the Last Year: No    Number of Times Moved in the Last Year: 0    Homeless in the Last Year: No  Utilities: Not At Risk (01/24/2024)   [facility] from [facility] Utilities    Threatened with loss of utilities: No  Health Literacy: Adequate Health Literacy (08/16/2023)   B1300 Health Literacy    Frequency of need for help with medical instructions: Never   Family History  Adopted: Yes  Problem Relation Age of Onset   Asthma Mother    Heart Problems Mother    Migraines Mother    COPD Mother  Heart attack Mother    Thyroid  disease Mother    Diabetes Father    Peptic Ulcer Father    Heart Problems Father    COPD Father    Heart attack Father    Dementia Maternal Grandmother    Heart Problems Maternal Grandfather    Heart attack Maternal Grandfather    Diabetes Paternal Grandmother    Diabetes Paternal Grandfather    Other Son        Growing pains   Colon cancer Neg Hx    Colon polyps Neg Hx    Esophageal cancer Neg Hx    Rectal cancer Neg Hx    Stomach cancer Neg Hx    Allergies[1] Current Outpatient Medications  Medication Sig Dispense Refill   acetaminophen  (TYLENOL ) 500 MG tablet Take 500 mg by mouth in the morning and at bedtime.     albuterol  (PROVENTIL ) (2.5 MG/3ML) 0.083% nebulizer solution Inhale 3 mLs (2.5 mg total) via nebulizer every 6 (six) hours as needed for wheezing or shortness of breath. 90 mL 0   albuterol  (VENTOLIN  HFA) 108 (90 Base) MCG/ACT inhaler Inhale 2 puffs into the lungs every 4 (four) hours as needed. 18 g 0   ALPRAZolam  (XANAX ) 0.25 MG tablet Take 0.25 mg by mouth 2 (two) times daily as needed for anxiety.     amphetamine -dextroamphetamine  (ADDERALL XR) 20 MG 24 hr capsule Take 20 mg by mouth daily.     azelastine  (ASTELIN ) 0.1 % nasal  spray Place 1 spray into both nostrils 2 (two) times daily. Use in each nostril as directed 30 mL 0   chlorthalidone  (HYGROTON ) 25 MG tablet Take 1 tablet (25 mg total) by mouth daily. Pt needs to schedule appt. Pt can get a 90 day supply after office visit - 3rd and final attempt (Patient not taking: Reported on 11/27/2024) 15 tablet 0   cyclobenzaprine  (FLEXERIL ) 10 MG tablet Take 1 tablet (10 mg total) by mouth 3 (three) times daily. 15 tablet 0   fluconazole  (DIFLUCAN ) 150 MG tablet Take 1 tablet (150 mg total) by mouth daily. May repeat in 3 days if needed. 2 tablet 0   fluticasone  (FLONASE ) 50 MCG/ACT nasal spray Place 1 spray into both nostrils 2 (two) times daily. 16 g 2   fluticasone  (FLOVENT  HFA) 110 MCG/ACT inhaler Inhale 1 puff into the lungs in the morning and at bedtime. Rinse mouth out after each use 1 each 0   meloxicam  (MOBIC ) 15 MG tablet Take 1 tablet (15 mg total) by mouth daily. 21 tablet 1   ondansetron  (ZOFRAN -ODT) 4 MG disintegrating tablet Take 4 mg by mouth every 8 (eight) hours as needed. (Patient not taking: Reported on 11/27/2024)     predniSONE  (DELTASONE ) 20 MG tablet Take 2 tablets (40 mg total) by mouth daily with breakfast. 10 tablet 0   promethazine -dextromethorphan  (PROMETHAZINE -DM) 6.25-15 MG/5ML syrup Take 5 mLs by mouth 4 (four) times daily as needed. 100 mL 0   QUEtiapine  (SEROQUEL ) 100 MG tablet Take 1 tablet (100 mg total) by mouth at bedtime. 90 tablet 0   rizatriptan  (MAXALT ) 5 MG tablet Take 5 mg by mouth as needed for migraine. May repeat in 2 hours if needed     tiZANidine  (ZANAFLEX ) 4 MG tablet Take 1 tablet (4 mg total) by mouth every 8 (eight) hours as needed for muscle spasms. 20 tablet 0   topiramate  (TOPAMAX ) 25 MG tablet Take 1 tablet (25 mg total) by mouth 2 (two) times daily. 60 tablet 1  Current Facility-Administered Medications  Medication Dose Route Frequency Provider Last Rate Last Admin   nitroGLYCERIN  (NITROSTAT ) SL tablet 0.4 mg  0.4 mg  Sublingual Q5 min PRN Ngetich, Dinah C, NP   0.4 mg at 09/14/19 1621   MM CLIP PLACEMENT RIGHT Result Date: 12/11/2024 CLINICAL DATA:  Status post RIGHT breast stereotactic biopsy EXAM: 3D DIAGNOSTIC RIGHT MAMMOGRAM POST STEREOTACTIC BIOPSY COMPARISON:  Previous exam(s). ACR Breast Density Category b: There are scattered areas of fibroglandular density. FINDINGS: 3D Mammographic images were obtained following stereotactic guided biopsy of RIGHT breast calcifications in the upper-outer quadrant. The biopsy marking clip is in expected position at the site of biopsy. IMPRESSION: Appropriate positioning of the coil shaped biopsy marking clip at the site of biopsy in the RIGHT breast. Final Assessment: Post Procedure Mammograms for Marker Placement Electronically Signed   By: Norleen Croak M.D.   On: 12/11/2024 09:37   MM RT BREAST BX W LOC DEV 1ST LESION IMAGE BX SPEC STEREO GUIDE Result Date: 12/11/2024 CLINICAL DATA:  Indeterminate RIGHT breast calcifications in the upper-outer quadrant EXAM: RIGHT BREAST STEREOTACTIC CORE NEEDLE BIOPSY COMPARISON:  Previous exam(s). FINDINGS: The patient and I discussed the procedure of stereotactic-guided biopsy including benefits and alternatives. We discussed the high likelihood of a successful procedure. We discussed the risks of the procedure including infection, bleeding, tissue injury, clip migration, and inadequate sampling. Informed written consent was given. The usual time out protocol was performed immediately prior to the procedure. Using sterile technique and 1% lidocaine  and 1% lidocaine  with epinephrine  as local anesthetic, under stereotactic guidance, a 9 gauge vacuum assisted device was used to perform core needle biopsy of calcifications in the upper-outer quadrant of the RIGHT breast using a superior approach. Specimen radiograph was performed showing representative calcifications. Specimens with calcifications are identified for pathology. Lesion quadrant:  Upper-outer At the conclusion of the procedure, coil shaped tissue marker clip was deployed into the biopsy cavity. Follow-up 2-view mammogram was performed and dictated separately. IMPRESSION: Stereotactic-guided biopsy of RIGHT breast calcifications in the upper-outer quadrant. No apparent complications. Electronically Signed   By: Norleen Croak M.D.   On: 12/11/2024 09:24    Review of Systems:   A ROS was performed including pertinent positives and negatives as documented in the HPI.  Physical Exam :   Constitutional: NAD and appears stated age Neurological: Alert and oriented Psych: Appropriate affect and cooperative Last menstrual period 02/19/2016.   Comprehensive Musculoskeletal Exam:    There is tenderness about bilateral paraspinal musculature as well as the central spine and the lumbar vertebral area over the L4-S1 region.  Positive straight leg raise bilaterally.  Distal neurosensory exams intact   Imaging:   Xray (4 view cervical spine): L5-S1 degenerative disc disease with facet arthritic changes    I personally reviewed and interpreted the radiographs.   Assessment and Plan:   48 y.o. female with evidence of lower back pain consistent with lumbar degenerative disc disease.  At this time I did recommend an initial epidural injection for treatment of her pain.  She would like to proceed with this.  I will plan to see her back as needed   I personally saw and evaluated the patient, and participated in the management and treatment plan.  Elspeth Parker, MD Attending Physician, Orthopedic Surgery  This document was dictated using Dragon voice recognition software. A reasonable attempt at proof reading has been made to minimize errors.    [1]  Allergies Allergen Reactions   Aspirin Anaphylaxis and  Hives        Nsaids Itching and Swelling

## 2024-12-13 LAB — SURGICAL PATHOLOGY

## 2024-12-18 ENCOUNTER — Ambulatory Visit: Admitting: Sports Medicine

## 2025-01-03 ENCOUNTER — Ambulatory Visit: Admitting: Pulmonary Disease

## 2025-01-07 ENCOUNTER — Ambulatory Visit: Admitting: Pulmonary Disease

## 2025-01-09 ENCOUNTER — Ambulatory Visit

## 2025-01-09 NOTE — Progress Notes (Unsigned)
" ° °  Subjective:    Patient ID: Victoria Moore, female    DOB: 49 y.o., [birth year]   MRN: [id number]  Chief Complaint: ***  Discussed the use of AI scribe software for clinical note transcription with the patient, who gave verbal consent to proceed.  History of Present Illness        Objective:   There were no vitals filed for this visit.  ***     Assessment & Plan:   Assessment & Plan    "

## 2025-01-10 ENCOUNTER — Ambulatory Visit: Admitting: Pulmonary Disease

## 2025-01-10 NOTE — Therapy (Deleted)
 " OUTPATIENT PHYSICAL THERAPY THORACOLUMBAR EVALUATION   Patient Name: Victoria Moore MRN: [id number] DOB:Oct 02, 1976, 49 y.o., female Today's Date: 01/10/2025  END OF SESSION:   Past Medical History:  Diagnosis Date   Acute low back pain without sciatica 06/22/2018   ADHD    Allergy     Anemia    Anxiety    Arthritis    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/10/2016   Refer to Rheumatology see Nov 25 2016 [place] :  ? Fibromyalgia, doubt sarcoid    Asthma    Blood transfusion without reported diagnosis    Chest wall pain 05/26/2018   Chronic kidney disease    Chronic low back pain with sciatica 09/15/2018   Common migraine with intractable migraine 10/07/2016   Cough variant asthma vs UACS  10/23/2016   - Spirometry 04/15/2016  Very truncated exp loop effort dep portion only  - Allergy  profile 10/22/2016 >  Eos 0.2/  IgE  52 RAST POS grass/trees/ragweed  10/22/2016  After extensive coaching HFA effectiveness =    75% try duelra 100 2bid > improved   Daytime sleepiness 05/26/2018   Decreased pedal pulses 10/04/2018   Depression    Depression, major, single episode, moderate (HCC) 05/26/2018   Diabetes (HCC)    Dyspnea 04/15/2016   04/15/2016  Walked RA x 3 laps @ 185 ft each stopped due to  End of study, nl pace, no sob or desat    - Spirometry 04/15/2016  Very truncated exp loop effort dep portion only  - 10/22/2016  Walked RA x 3 laps @ 185 ft each stopped due to  End of study, slow pace, min sob/ no desat - full pfts rec 10/22/2016 >>>       Edema    Essential hypertension, benign 11/15/2016   Fibroids 03/11/2016   Frequency of urination 09/11/2018   GERD (gastroesophageal reflux disease)    Herniated lumbar intervertebral disc    Hiatal hernia    Hilar adenopathy    Hypertension    Hypokalemia 06/28/2018   Impaired fasting blood sugar 09/11/2018   Iron deficiency    Leg pain 12/07/2018   Low libido 11/27/2018   Migraine    Mild sleep apnea 08/03/2018   HST 06/20/18  AHI  8.1 /  snoring with 02 nadir 80% >  08/03/2018 rec sleep medicine consultation    Morbid (severe) obesity due to excess calories (HCC) 10/23/2016   Personal history of sarcoidosis 05/26/2018   Prolonged capillary refill time    Right leg swelling 08/18/2018   Sarcoidosis    personal history of   Seizures (HCC)    history of    Sleep apnea    Spondylosis    Vitamin D  deficiency    Past Surgical History:  Procedure Laterality Date   ABDOMINAL HYSTERECTOMY     BREAST BIOPSY Right 12/11/2024   MM RT BREAST BX W LOC DEV 1ST LESION IMAGE BX SPEC STEREO GUIDE 12/11/2024 [facility]   ESOPHAGEAL MANOMETRY N/A 09/05/2019   Procedure: ESOPHAGEAL MANOMETRY (EM);  Surgeon: San Sandor GAILS, DO;  Location: [facility];  Service: Gastroenterology;  Laterality: N/A;   GASTRIC BYPASS  2021   *approximately   LAPAROSCOPIC OVARIAN CYSTECTOMY Left 03/11/2016   Procedure: LAPAROSCOPIC OVARIAN CYSTECTOMY;  Surgeon: Shanda SHAUNNA Muscat, MD;  Location: [facility];  Service: Gynecology;  Laterality: Left;   LAPAROSCOPIC VAGINAL HYSTERECTOMY WITH SALPINGECTOMY Bilateral 03/11/2016   Procedure: LAPAROSCOPIC ASSISTED VAGINAL HYSTERECTOMY WITH SALPINGECTOMY;  Surgeon: Shanda SHAUNNA Muscat, MD;  Location: [facility];  Service: Gynecology;  Laterality: Bilateral;   PH IMPEDANCE STUDY  09/05/2019   Procedure: PH IMPEDANCE STUDY;  Surgeon: San Sandor GAILS, DO;  Location: [facility];  Service: Gastroenterology;;   TUBAL LIGATION     UPPER GASTROINTESTINAL ENDOSCOPY  10/11/2019   06/2019   Patient Active Problem List   Diagnosis Date Noted   Postmenopausal estrogen deficiency 10/10/2024   Vaginal itching 10/10/2024   Preventative health care 08/14/2024   Annual visit for general adult medical examination without abnormal findings 08/10/2024   Chronic kidney disease    Obesity (BMI 30.0-34.9) 07/26/2024   Encounter to establish care 07/24/2024   Epilepsy (HCC) 02/29/2024   Peripheral neuropathy 10/12/2022   MDD (major  depressive disorder) 10/12/2022   History of epilepsy 09/22/2022   Asthmatic bronchitis 01/23/2021   Cervical radiculitis 10/15/2020   Social anxiety disorder 10/03/2020   Attention deficit hyperactivity disorder (ADHD), 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   Mild episode of recurrent major depressive disorder 10/03/2020   S/P gastric bypass 05/12/2020   Intractable abdominal pain 04/27/2020   Benign intracranial hypertension 02/14/2020   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   Complex cyst of left ovary 02/01/2019   Sarcoidosis 02/01/2019   Leg pain 12/07/2018   Low libido 11/27/2018   Decreased pedal pulses 10/04/2018   Chronic low back pain with sciatica 09/15/2018   Impaired fasting blood sugar 09/11/2018   Mild sleep apnea 08/03/2018   Hypokalemia 06/28/2018   Medication side effect 06/22/2018   Vitamin D  deficiency 05/26/2018   Encounter for health maintenance examination with abnormal findings 05/26/2018   Depression, major, single episode, moderate (HCC) 05/26/2018   Daytime sleepiness 05/26/2018   GERD (gastroesophageal reflux disease) 05/26/2018   Essential hypertension, benign 11/15/2016   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/10/2016   Class 1 obesity with body mass index (BMI) of 32.0 to 32.9 in adult 10/23/2016   Common migraine with intractable migraine 10/07/2016   DOE (dyspnea on exertion) 04/15/2016   Seizures (HCC) 03/11/2016   Anemia 11/12/2014   Iron deficiency 11/12/2014    PCP: Wheeler Harlene CROME, NP  REFERRING PROVIDER: Charles Redell LABOR, DO  REFERRING DIAG: M54.42,M54.41,G89.29 (ICD-10-CM) - Chronic bilateral low back pain with bilateral sciatica  Rationale for Evaluation and Treatment: Rehabilitation  THERAPY DIAG:  No diagnosis found.  ONSET DATE: ***  SUBJECTIVE:                                                                                                                                                                                            SUBJECTIVE STATEMENT: ***  PERTINENT HISTORY:  Chronic low back pain with bilateral sciatica  Persistent pain radiates down both legs, with increased upper back pain. Celebrex  provides minimal relief for about an hour. Physical therapy referral was delayed due to scheduling issues, and home exercises have not significantly improved her condition. A previous MRI in 2021 showed bulging discs, likely causing nerve irritation and pain. Insurance requires six weeks of physical therapy before considering a repeat MRI. A short course of steroids is considered for acute exacerbations, especially during busy periods like Christmas. X-rays are ordered to assess the current status and satisfy insurance requirements. She is referred to physical therapy in [place] for immediate initiation. A different anti-inflammatory medication is prescribed for longer-lasting relief. A short course of steroids is provided for use during periods of increased activity or pain. A follow-up appointment is scheduled in six weeks.  PAIN:  Are you having pain? {OPRCPAIN:27236}  PRECAUTIONS: None  RED FLAGS: {PT Red Flags:29287}   WEIGHT BEARING RESTRICTIONS: No  FALLS:  Has patient fallen in last 6 months? {fallsyesno:27318}  LIVING ENVIRONMENT: Lives with: {OPRC lives with:25569::lives with their family} Lives in: {Lives in:25570} Stairs: {opstairs:27293} Has following equipment at home: {Assistive devices:23999}  OCCUPATION: ***  PLOF: {PLOF:24004}  PATIENT GOALS: ***  NEXT MD VISIT: ***  OBJECTIVE:  Note: Objective measures were completed at Evaluation unless otherwise noted.  DIAGNOSTIC FINDINGS:  DISCS AND DEGENERATIVE CHANGES: No severe degenerative changes.   SOFT TISSUES: No acute abnormality.   IMPRESSION: 1. No acute osseous abnormality of the lumbar spine.  PATIENT SURVEYS:  Modified Oswestry:   Interpretation of scores: Score Category Description  0-20% Minimal Disability  The patient can cope with most living activities. Usually no treatment is indicated apart from advice on lifting, sitting and exercise  21-40% Moderate Disability The patient experiences more pain and difficulty with sitting, lifting and standing. Travel and social life are more difficult and they may be disabled from work. Personal care, sexual activity and sleeping are not grossly affected, and the patient can usually be managed by conservative means  41-60% Severe Disability Pain remains the main problem in this group, but activities of daily living are affected. These patients require a detailed investigation  61-80% Crippled Back pain impinges on all aspects of the patients life. Positive intervention is required  81-100% Bed-bound These patients are either bed-bound or exaggerating their symptoms  Bluford FORBES Zoe DELENA Karon DELENA, et al. Surgery versus conservative management of stable thoracolumbar fracture: the PRESTO feasibility RCT. [place] ([place]): [employer]; 2021 Nov. [facility] Technology Assessment, No. 25.62.) Appendix 3, Oswestry Disability Index category descriptors. Available from: Findjewelers.cz  Minimally Clinically Important Difference (MCID) = 12.8%  COGNITION: Overall cognitive status: {cognition:24006}     SENSATION: {sensation:27233}  MUSCLE LENGTH: Hamstrings: Right *** deg; Left *** deg Debby test: Right *** deg; Left *** deg  POSTURE: {posture:25561}  PALPATION: ***  LUMBAR ROM:   AROM eval  Flexion   Extension   Right lateral flexion   Left lateral flexion   Right rotation   Left rotation    (Blank rows = not tested)  LOWER EXTREMITY ROM:     {AROM/PROM:27142}  Right eval Left eval  Hip flexion    Hip extension    Hip abduction    Hip adduction    Hip internal rotation    Hip external rotation    Knee flexion    Knee extension    Ankle dorsiflexion    Ankle plantarflexion    Ankle inversion     Ankle eversion     (  Blank rows = not tested)  LOWER EXTREMITY MMT:    MMT Right eval Left eval  Hip flexion    Hip extension    Hip abduction    Hip adduction    Hip internal rotation    Hip external rotation    Knee flexion    Knee extension    Ankle dorsiflexion    Ankle plantarflexion    Ankle inversion    Ankle eversion     (Blank rows = not tested)  LUMBAR SPECIAL TESTS:  {lumbar special test:25242}  FUNCTIONAL TESTS:  {Functional tests:24029}  GAIT: Distance walked: *** Assistive device utilized: {Assistive devices:23999} Level of assistance: {Levels of assistance:24026} Comments: ***  TREATMENT DATE:  2025-02-07: PT Eval and HEP                                                                                                                                 PATIENT EDUCATION:  Education details: PT evaluation, objective findings, POC, Importance of HEP, Precautions, Clinic policies  Person educated: Patient Education method: Explanation and Demonstration Education comprehension: verbalized understanding and returned demonstration  HOME EXERCISE PROGRAM: ***  ASSESSMENT:  CLINICAL IMPRESSION: Patient is a 49 y.o. female who was seen today for physical therapy evaluation and treatment for M54.42,M54.41,G89.29 (ICD-10-CM) - Chronic bilateral low back pain with bilateral sciatica.   OBJECTIVE IMPAIRMENTS: {opptimpairments:25111}.   ACTIVITY LIMITATIONS: {activitylimitations:27494}  PARTICIPATION LIMITATIONS: {participationrestrictions:25113}  PERSONAL FACTORS: {Personal factors:25162} are also affecting patient's functional outcome.   REHAB POTENTIAL: {rehabpotential:25112}  CLINICAL DECISION MAKING: {clinical decision making:25114}  EVALUATION COMPLEXITY: {Evaluation complexity:25115}   GOALS: Goals reviewed with patient? No  SHORT TERM GOALS: Target date: 02/08/25 Patient will be independent with performance of HEP to demonstrate adequate self  management of symptoms.  Baseline:  Goal status: INITIAL  2.   Patient will report at least a 25% improvement with function and/or pain reduction overall since beginning PT. Baseline:  Goal status: INITIAL   LONG TERM GOALS: Target date: 03/01/25  *** Baseline:  Goal status: INITIAL  2.  *** Baseline:  Goal status: INITIAL  3.  *** Baseline:  Goal status: INITIAL  4.  *** Baseline:  Goal status: INITIAL  5.  *** Baseline:  Goal status: INITIAL  6.  *** Baseline:  Goal status: INITIAL  PLAN:  PT FREQUENCY: 1-2x/week  PT DURATION: 6 weeks  PLANNED INTERVENTIONS: 97164- PT Re-evaluation, 97110-Therapeutic exercises, 97530- Therapeutic activity, 97112- Neuromuscular re-education, 97535- Self Care, 02859- Manual therapy, [id number]- Gait training, [id number]- Electrical stimulation (manual), [id number]- Ultrasound, 02987- Traction (mechanical), 20560 (1-2 muscles), 20561 (3+ muscles)- Dry Needling, Patient/Family education, Balance training, Stair training, Taping, Joint mobilization, Spinal mobilization, Cryotherapy, and Moist heat.  PLAN FOR NEXT SESSION: Review HEP and goals,    4:23 PM, 01/10/25 Rosaria Settler, PT, DPT [facility] [place]    Managed Medicaid Authorization Request Treatment Start Date: February 07, 2025  Visit Dx Codes: ***  Functional Tool Score: ***  For all possible CPT codes, reference the Planned Interventions line above.     Check all conditions that are expected to impact treatment: {Conditions expected to impact treatment:{Conditions expected to impact treatment:28273}   If treatment provided at initial evaluation, no treatment charged due to lack of authorization.     "

## 2025-01-14 ENCOUNTER — Ambulatory Visit (HOSPITAL_COMMUNITY)

## 2025-01-14 ENCOUNTER — Telehealth (HOSPITAL_COMMUNITY): Payer: Self-pay

## 2025-01-14 NOTE — Telephone Encounter (Signed)
 Attempted to call patient twice regarding missed PT evaluation. Calls not answered and unable to leave message as patient's mailbox was full.   1:09 PM, 01/14/25 Rosaria Settler, PT, DPT [facility] [place]

## 2025-01-17 ENCOUNTER — Ambulatory Visit: Admitting: Sleep Medicine

## 2025-01-25 ENCOUNTER — Ambulatory Visit: Admitting: Sleep Medicine

## 2025-01-26 ENCOUNTER — Other Ambulatory Visit: Payer: Self-pay | Admitting: Student

## 2025-01-26 DIAGNOSIS — F3341 Major depressive disorder, recurrent, in partial remission: Secondary | ICD-10-CM

## 2025-01-28 ENCOUNTER — Encounter: Admitting: Obstetrics and Gynecology

## 2025-02-21 ENCOUNTER — Encounter: Admitting: Family Medicine
